# Patient Record
Sex: Female | Born: 1935
Health system: Southern US, Community
[De-identification: ages and names within clinical notes are randomized; demographics above are authoritative.]

## PROBLEM LIST (undated history)

## (undated) DIAGNOSIS — K219 Gastro-esophageal reflux disease without esophagitis: Secondary | ICD-10-CM

## (undated) DIAGNOSIS — I878 Other specified disorders of veins: Secondary | ICD-10-CM

## (undated) DIAGNOSIS — F419 Anxiety disorder, unspecified: Secondary | ICD-10-CM

## (undated) DIAGNOSIS — I10 Essential (primary) hypertension: Secondary | ICD-10-CM

## (undated) DIAGNOSIS — I82409 Acute embolism and thrombosis of unspecified deep veins of unspecified lower extremity: Secondary | ICD-10-CM

## (undated) DIAGNOSIS — K5790 Diverticulosis of intestine, part unspecified, without perforation or abscess without bleeding: Secondary | ICD-10-CM

## (undated) DIAGNOSIS — C541 Malignant neoplasm of endometrium: Secondary | ICD-10-CM

## (undated) DIAGNOSIS — R413 Other amnesia: Secondary | ICD-10-CM

## (undated) DIAGNOSIS — K5731 Diverticulosis of large intestine without perforation or abscess with bleeding: Principal | ICD-10-CM

## (undated) DIAGNOSIS — L97929 Non-pressure chronic ulcer of unspecified part of left lower leg with unspecified severity: Secondary | ICD-10-CM

## (undated) DIAGNOSIS — Z800 Family history of malignant neoplasm of digestive organs: Secondary | ICD-10-CM

## (undated) DIAGNOSIS — E611 Iron deficiency: Secondary | ICD-10-CM

## (undated) DIAGNOSIS — Z923 Personal history of irradiation: Secondary | ICD-10-CM

## (undated) DIAGNOSIS — Z9289 Personal history of other medical treatment: Secondary | ICD-10-CM

## (undated) DIAGNOSIS — L97919 Non-pressure chronic ulcer of unspecified part of right lower leg with unspecified severity: Secondary | ICD-10-CM

## (undated) DIAGNOSIS — C50919 Malignant neoplasm of unspecified site of unspecified female breast: Secondary | ICD-10-CM

## (undated) DIAGNOSIS — F0390 Unspecified dementia without behavioral disturbance: Secondary | ICD-10-CM

## (undated) DIAGNOSIS — I739 Peripheral vascular disease, unspecified: Secondary | ICD-10-CM

## (undated) HISTORY — DX: Family history of malignant neoplasm of digestive organs: Z80.0

## (undated) HISTORY — DX: Personal history of irradiation: Z92.3

## (undated) HISTORY — PX: BREAST LUMPECTOMY: SHX2

## (undated) HISTORY — DX: Non-pressure chronic ulcer of unspecified part of right lower leg with unspecified severity: L97.919

## (undated) HISTORY — DX: Malignant neoplasm of unspecified site of unspecified female breast: C50.919

## (undated) HISTORY — DX: Essential (primary) hypertension: I10

## (undated) HISTORY — DX: Acute embolism and thrombosis of unspecified deep veins of unspecified lower extremity: I82.409

## (undated) HISTORY — DX: Anxiety disorder, unspecified: F41.9

## (undated) HISTORY — DX: Diverticulosis of intestine, part unspecified, without perforation or abscess without bleeding: K57.90

## (undated) HISTORY — DX: Other specified disorders of veins: I87.8

## (undated) HISTORY — DX: Other amnesia: R41.3

## (undated) HISTORY — DX: Iron deficiency: E61.1

## (undated) HISTORY — DX: Unspecified dementia, unspecified severity, without behavioral disturbance, psychotic disturbance, mood disturbance, and anxiety: F03.90

## (undated) HISTORY — DX: Non-pressure chronic ulcer of unspecified part of left lower leg with unspecified severity: L97.929

---

## 1990-11-22 DIAGNOSIS — I82409 Acute embolism and thrombosis of unspecified deep veins of unspecified lower extremity: Secondary | ICD-10-CM

## 1990-11-22 HISTORY — DX: Acute embolism and thrombosis of unspecified deep veins of unspecified lower extremity: I82.409

## 1992-11-22 HISTORY — PX: MASTECTOMY: SHX3

## 1999-02-24 ENCOUNTER — Inpatient Hospital Stay (HOSPITAL_COMMUNITY): Admission: RE | Admit: 1999-02-24 | Discharge: 1999-03-04 | Payer: Self-pay | Admitting: Vascular Surgery

## 1999-02-24 ENCOUNTER — Encounter: Payer: Self-pay | Admitting: Vascular Surgery

## 1999-12-01 ENCOUNTER — Inpatient Hospital Stay (HOSPITAL_COMMUNITY): Admission: RE | Admit: 1999-12-01 | Discharge: 1999-12-12 | Payer: Self-pay | Admitting: Vascular Surgery

## 2000-01-20 ENCOUNTER — Encounter: Admission: RE | Admit: 2000-01-20 | Discharge: 2000-01-20 | Payer: Self-pay | Admitting: Internal Medicine

## 2000-01-20 ENCOUNTER — Encounter: Payer: Self-pay | Admitting: Internal Medicine

## 2001-01-23 ENCOUNTER — Encounter: Admission: RE | Admit: 2001-01-23 | Discharge: 2001-01-23 | Payer: Self-pay | Admitting: Internal Medicine

## 2001-01-23 ENCOUNTER — Encounter: Payer: Self-pay | Admitting: Internal Medicine

## 2002-06-08 ENCOUNTER — Encounter: Admission: RE | Admit: 2002-06-08 | Discharge: 2002-06-08 | Payer: Self-pay | Admitting: Internal Medicine

## 2002-06-08 ENCOUNTER — Encounter: Payer: Self-pay | Admitting: Internal Medicine

## 2003-01-24 ENCOUNTER — Ambulatory Visit (HOSPITAL_COMMUNITY): Admission: RE | Admit: 2003-01-24 | Discharge: 2003-01-24 | Payer: Self-pay | Admitting: Internal Medicine

## 2003-01-24 ENCOUNTER — Encounter: Payer: Self-pay | Admitting: Internal Medicine

## 2003-05-24 ENCOUNTER — Ambulatory Visit (HOSPITAL_COMMUNITY): Admission: RE | Admit: 2003-05-24 | Discharge: 2003-05-24 | Payer: Self-pay | Admitting: Pulmonary Disease

## 2003-05-24 ENCOUNTER — Encounter: Payer: Self-pay | Admitting: Internal Medicine

## 2003-07-03 ENCOUNTER — Encounter: Payer: Self-pay | Admitting: Internal Medicine

## 2003-07-03 ENCOUNTER — Encounter: Admission: RE | Admit: 2003-07-03 | Discharge: 2003-07-03 | Payer: Self-pay | Admitting: Internal Medicine

## 2003-12-21 ENCOUNTER — Emergency Department (HOSPITAL_COMMUNITY): Admission: EM | Admit: 2003-12-21 | Discharge: 2003-12-21 | Payer: Self-pay | Admitting: Emergency Medicine

## 2004-12-07 ENCOUNTER — Encounter: Admission: RE | Admit: 2004-12-07 | Discharge: 2004-12-07 | Payer: Self-pay | Admitting: Internal Medicine

## 2005-08-17 ENCOUNTER — Encounter (HOSPITAL_BASED_OUTPATIENT_CLINIC_OR_DEPARTMENT_OTHER): Admission: RE | Admit: 2005-08-17 | Discharge: 2005-11-15 | Payer: Self-pay | Admitting: Surgery

## 2005-11-22 HISTORY — PX: OTHER SURGICAL HISTORY: SHX169

## 2005-11-29 ENCOUNTER — Encounter (HOSPITAL_BASED_OUTPATIENT_CLINIC_OR_DEPARTMENT_OTHER): Admission: RE | Admit: 2005-11-29 | Discharge: 2005-12-23 | Payer: Self-pay | Admitting: Surgery

## 2005-12-15 ENCOUNTER — Encounter: Admission: RE | Admit: 2005-12-15 | Discharge: 2005-12-15 | Payer: Self-pay | Admitting: Internal Medicine

## 2006-01-04 ENCOUNTER — Encounter: Admission: RE | Admit: 2006-01-04 | Discharge: 2006-01-04 | Payer: Self-pay | Admitting: Internal Medicine

## 2006-01-13 ENCOUNTER — Encounter: Admission: RE | Admit: 2006-01-13 | Discharge: 2006-01-13 | Payer: Self-pay | Admitting: Internal Medicine

## 2006-01-13 ENCOUNTER — Encounter (INDEPENDENT_AMBULATORY_CARE_PROVIDER_SITE_OTHER): Payer: Self-pay | Admitting: Specialist

## 2006-01-13 ENCOUNTER — Encounter (INDEPENDENT_AMBULATORY_CARE_PROVIDER_SITE_OTHER): Payer: Self-pay | Admitting: Radiology

## 2006-01-24 ENCOUNTER — Encounter: Admission: RE | Admit: 2006-01-24 | Discharge: 2006-01-24 | Payer: Self-pay | Admitting: General Surgery

## 2006-01-26 ENCOUNTER — Encounter: Admission: RE | Admit: 2006-01-26 | Discharge: 2006-01-26 | Payer: Self-pay | Admitting: General Surgery

## 2006-01-26 ENCOUNTER — Encounter (INDEPENDENT_AMBULATORY_CARE_PROVIDER_SITE_OTHER): Payer: Self-pay | Admitting: Specialist

## 2006-01-26 ENCOUNTER — Ambulatory Visit (HOSPITAL_BASED_OUTPATIENT_CLINIC_OR_DEPARTMENT_OTHER): Admission: RE | Admit: 2006-01-26 | Discharge: 2006-01-26 | Payer: Self-pay | Admitting: General Surgery

## 2006-02-04 ENCOUNTER — Ambulatory Visit (HOSPITAL_BASED_OUTPATIENT_CLINIC_OR_DEPARTMENT_OTHER): Admission: RE | Admit: 2006-02-04 | Discharge: 2006-02-04 | Payer: Self-pay | Admitting: General Surgery

## 2006-02-04 ENCOUNTER — Encounter (INDEPENDENT_AMBULATORY_CARE_PROVIDER_SITE_OTHER): Payer: Self-pay | Admitting: Specialist

## 2006-02-10 ENCOUNTER — Ambulatory Visit: Admission: RE | Admit: 2006-02-10 | Discharge: 2006-04-28 | Payer: Self-pay | Admitting: Radiation Oncology

## 2006-04-19 ENCOUNTER — Ambulatory Visit (HOSPITAL_COMMUNITY): Admission: RE | Admit: 2006-04-19 | Discharge: 2006-04-19 | Payer: Self-pay | Admitting: Internal Medicine

## 2006-04-19 ENCOUNTER — Encounter (INDEPENDENT_AMBULATORY_CARE_PROVIDER_SITE_OTHER): Payer: Self-pay | Admitting: *Deleted

## 2006-05-02 ENCOUNTER — Encounter: Payer: Self-pay | Admitting: Vascular Surgery

## 2006-05-02 ENCOUNTER — Ambulatory Visit (HOSPITAL_COMMUNITY): Admission: RE | Admit: 2006-05-02 | Discharge: 2006-05-02 | Payer: Self-pay | Admitting: Internal Medicine

## 2006-08-05 ENCOUNTER — Encounter (HOSPITAL_BASED_OUTPATIENT_CLINIC_OR_DEPARTMENT_OTHER): Admission: RE | Admit: 2006-08-05 | Discharge: 2006-08-18 | Payer: Self-pay | Admitting: Surgery

## 2006-08-22 ENCOUNTER — Encounter (HOSPITAL_BASED_OUTPATIENT_CLINIC_OR_DEPARTMENT_OTHER): Admission: RE | Admit: 2006-08-22 | Discharge: 2006-10-05 | Payer: Self-pay | Admitting: Surgery

## 2006-10-11 ENCOUNTER — Ambulatory Visit: Payer: Self-pay | Admitting: Oncology

## 2006-12-14 ENCOUNTER — Ambulatory Visit: Payer: Self-pay | Admitting: Oncology

## 2006-12-19 LAB — COMPREHENSIVE METABOLIC PANEL
BUN: 24 mg/dL — ABNORMAL HIGH (ref 6–23)
CO2: 26 mEq/L (ref 19–32)
Creatinine, Ser: 0.96 mg/dL (ref 0.40–1.20)
Glucose, Bld: 104 mg/dL — ABNORMAL HIGH (ref 70–99)
Sodium: 141 mEq/L (ref 135–145)
Total Bilirubin: 0.4 mg/dL (ref 0.3–1.2)
Total Protein: 7 g/dL (ref 6.0–8.3)

## 2006-12-19 LAB — CBC WITH DIFFERENTIAL/PLATELET
Basophils Absolute: 0 10*3/uL (ref 0.0–0.1)
Eosinophils Absolute: 0.1 10*3/uL (ref 0.0–0.5)
HCT: 35.9 % (ref 34.8–46.6)
HGB: 12.2 g/dL (ref 11.6–15.9)
LYMPH%: 27.2 % (ref 14.0–48.0)
MONO#: 0.5 10*3/uL (ref 0.1–0.9)
NEUT#: 4 10*3/uL (ref 1.5–6.5)
NEUT%: 63.4 % (ref 39.6–76.8)
Platelets: 261 10*3/uL (ref 145–400)
WBC: 6.3 10*3/uL (ref 3.9–10.0)

## 2006-12-19 LAB — LACTATE DEHYDROGENASE: LDH: 135 U/L (ref 94–250)

## 2007-01-19 ENCOUNTER — Encounter: Admission: RE | Admit: 2007-01-19 | Discharge: 2007-01-19 | Payer: Self-pay | Admitting: Internal Medicine

## 2007-04-21 ENCOUNTER — Ambulatory Visit: Payer: Self-pay | Admitting: Oncology

## 2007-04-25 LAB — COMPREHENSIVE METABOLIC PANEL
ALT: 19 U/L (ref 0–35)
CO2: 23 mEq/L (ref 19–32)
Calcium: 9.4 mg/dL (ref 8.4–10.5)
Chloride: 106 mEq/L (ref 96–112)
Potassium: 4.2 mEq/L (ref 3.5–5.3)
Sodium: 143 mEq/L (ref 135–145)
Total Bilirubin: 0.5 mg/dL (ref 0.3–1.2)
Total Protein: 6.8 g/dL (ref 6.0–8.3)

## 2007-04-25 LAB — CBC WITH DIFFERENTIAL/PLATELET
BASO%: 0.3 % (ref 0.0–2.0)
MCHC: 34.3 g/dL (ref 32.0–36.0)
MONO#: 0.6 10*3/uL (ref 0.1–0.9)
RBC: 3.95 10*6/uL (ref 3.70–5.32)
RDW: 15.9 % — ABNORMAL HIGH (ref 11.3–14.5)
WBC: 5.5 10*3/uL (ref 3.9–10.0)
lymph#: 1.3 10*3/uL (ref 0.9–3.3)

## 2007-04-25 LAB — IRON AND TIBC: TIBC: 344 ug/dL (ref 250–470)

## 2007-04-25 LAB — LACTATE DEHYDROGENASE: LDH: 134 U/L (ref 94–250)

## 2007-06-29 ENCOUNTER — Encounter (HOSPITAL_BASED_OUTPATIENT_CLINIC_OR_DEPARTMENT_OTHER): Admission: RE | Admit: 2007-06-29 | Discharge: 2007-08-22 | Payer: Self-pay | Admitting: Internal Medicine

## 2007-08-17 ENCOUNTER — Ambulatory Visit: Payer: Self-pay | Admitting: Oncology

## 2007-08-21 ENCOUNTER — Encounter (HOSPITAL_BASED_OUTPATIENT_CLINIC_OR_DEPARTMENT_OTHER): Admission: RE | Admit: 2007-08-21 | Discharge: 2007-11-19 | Payer: Self-pay | Admitting: Surgery

## 2007-08-21 LAB — COMPREHENSIVE METABOLIC PANEL
ALT: 12 U/L (ref 0–35)
BUN: 26 mg/dL — ABNORMAL HIGH (ref 6–23)
CO2: 25 mEq/L (ref 19–32)
Calcium: 9.5 mg/dL (ref 8.4–10.5)
Chloride: 106 mEq/L (ref 96–112)
Creatinine, Ser: 1.05 mg/dL (ref 0.40–1.20)
Glucose, Bld: 90 mg/dL (ref 70–99)

## 2007-08-21 LAB — CBC WITH DIFFERENTIAL/PLATELET
Basophils Absolute: 0 10*3/uL (ref 0.0–0.1)
Eosinophils Absolute: 0.1 10*3/uL (ref 0.0–0.5)
HGB: 11.7 g/dL (ref 11.6–15.9)
MCV: 84.4 fL (ref 81.0–101.0)
MONO%: 9.5 % (ref 0.0–13.0)
NEUT#: 4.4 10*3/uL (ref 1.5–6.5)
RDW: 17.1 % — ABNORMAL HIGH (ref 11.3–14.5)

## 2007-08-21 LAB — LACTATE DEHYDROGENASE: LDH: 143 U/L (ref 94–250)

## 2007-12-19 ENCOUNTER — Ambulatory Visit: Payer: Self-pay | Admitting: Oncology

## 2007-12-21 LAB — COMPREHENSIVE METABOLIC PANEL
ALT: 13 U/L (ref 0–35)
AST: 14 U/L (ref 0–37)
Albumin: 4.3 g/dL (ref 3.5–5.2)
BUN: 25 mg/dL — ABNORMAL HIGH (ref 6–23)
Calcium: 9.5 mg/dL (ref 8.4–10.5)
Chloride: 105 mEq/L (ref 96–112)
Potassium: 4.5 mEq/L (ref 3.5–5.3)

## 2007-12-21 LAB — CBC WITH DIFFERENTIAL/PLATELET
BASO%: 0.2 % (ref 0.0–2.0)
Basophils Absolute: 0 10*3/uL (ref 0.0–0.1)
EOS%: 0.7 % (ref 0.0–7.0)
HGB: 12.4 g/dL (ref 11.6–15.9)
MCH: 28.3 pg (ref 26.0–34.0)
MONO#: 0.6 10*3/uL (ref 0.1–0.9)
NEUT#: 4.5 10*3/uL (ref 1.5–6.5)
RDW: 15.8 % — ABNORMAL HIGH (ref 11.3–14.5)
WBC: 6.8 10*3/uL (ref 3.9–10.0)
lymph#: 1.7 10*3/uL (ref 0.9–3.3)

## 2008-01-25 ENCOUNTER — Encounter: Admission: RE | Admit: 2008-01-25 | Discharge: 2008-01-25 | Payer: Self-pay | Admitting: Internal Medicine

## 2008-04-16 ENCOUNTER — Ambulatory Visit: Payer: Self-pay | Admitting: Oncology

## 2008-04-18 LAB — COMPREHENSIVE METABOLIC PANEL
AST: 11 U/L (ref 0–37)
Alkaline Phosphatase: 66 U/L (ref 39–117)
BUN: 20 mg/dL (ref 6–23)
Glucose, Bld: 89 mg/dL (ref 70–99)
Sodium: 140 mEq/L (ref 135–145)
Total Bilirubin: 0.5 mg/dL (ref 0.3–1.2)

## 2008-04-18 LAB — CBC WITH DIFFERENTIAL/PLATELET
Basophils Absolute: 0 10*3/uL (ref 0.0–0.1)
EOS%: 1.2 % (ref 0.0–7.0)
HCT: 34.9 % (ref 34.8–46.6)
HGB: 11.8 g/dL (ref 11.6–15.9)
MCH: 28.8 pg (ref 26.0–34.0)
MCV: 85.2 fL (ref 81.0–101.0)
MONO%: 8.6 % (ref 0.0–13.0)
NEUT%: 67.5 % (ref 39.6–76.8)
RDW: 16 % — ABNORMAL HIGH (ref 11.3–14.5)

## 2008-10-09 ENCOUNTER — Ambulatory Visit: Payer: Self-pay | Admitting: Oncology

## 2008-10-11 LAB — COMPREHENSIVE METABOLIC PANEL
ALT: 13 U/L (ref 0–35)
AST: 12 U/L (ref 0–37)
Calcium: 9.3 mg/dL (ref 8.4–10.5)
Chloride: 104 mEq/L (ref 96–112)
Creatinine, Ser: 1.02 mg/dL (ref 0.40–1.20)
Sodium: 140 mEq/L (ref 135–145)
Total Protein: 6.6 g/dL (ref 6.0–8.3)

## 2008-10-11 LAB — CBC WITH DIFFERENTIAL/PLATELET
BASO%: 0.1 % (ref 0.0–2.0)
EOS%: 1.1 % (ref 0.0–7.0)
HCT: 35.3 % (ref 34.8–46.6)
MCH: 29.6 pg (ref 26.0–34.0)
MCHC: 33.9 g/dL (ref 32.0–36.0)
NEUT%: 64.8 % (ref 39.6–76.8)
RBC: 4.05 10*6/uL (ref 3.70–5.32)
RDW: 15.9 % — ABNORMAL HIGH (ref 11.3–14.5)
WBC: 6 10*3/uL (ref 3.9–10.0)
lymph#: 1.5 10*3/uL (ref 0.9–3.3)

## 2008-10-14 ENCOUNTER — Encounter (HOSPITAL_BASED_OUTPATIENT_CLINIC_OR_DEPARTMENT_OTHER): Admission: RE | Admit: 2008-10-14 | Discharge: 2008-11-21 | Payer: Self-pay | Admitting: Internal Medicine

## 2008-11-26 ENCOUNTER — Encounter (HOSPITAL_BASED_OUTPATIENT_CLINIC_OR_DEPARTMENT_OTHER): Admission: RE | Admit: 2008-11-26 | Discharge: 2009-02-24 | Payer: Self-pay | Admitting: Internal Medicine

## 2009-01-27 ENCOUNTER — Encounter: Admission: RE | Admit: 2009-01-27 | Discharge: 2009-01-27 | Payer: Self-pay | Admitting: Internal Medicine

## 2009-02-25 ENCOUNTER — Encounter (HOSPITAL_BASED_OUTPATIENT_CLINIC_OR_DEPARTMENT_OTHER): Admission: RE | Admit: 2009-02-25 | Discharge: 2009-03-26 | Payer: Self-pay | Admitting: General Surgery

## 2009-04-08 ENCOUNTER — Ambulatory Visit: Payer: Self-pay | Admitting: Oncology

## 2009-04-10 LAB — COMPREHENSIVE METABOLIC PANEL
ALT: 10 U/L (ref 0–35)
Albumin: 4.1 g/dL (ref 3.5–5.2)
CO2: 26 mEq/L (ref 19–32)
Calcium: 9.5 mg/dL (ref 8.4–10.5)
Chloride: 105 mEq/L (ref 96–112)
Glucose, Bld: 91 mg/dL (ref 70–99)
Potassium: 4.3 mEq/L (ref 3.5–5.3)
Sodium: 141 mEq/L (ref 135–145)
Total Protein: 6.9 g/dL (ref 6.0–8.3)

## 2009-04-10 LAB — CBC WITH DIFFERENTIAL/PLATELET
BASO%: 0.4 % (ref 0.0–2.0)
Basophils Absolute: 0 10*3/uL (ref 0.0–0.1)
EOS%: 0.9 % (ref 0.0–7.0)
HCT: 35.8 % (ref 34.8–46.6)
HGB: 12.1 g/dL (ref 11.6–15.9)
LYMPH%: 25.7 % (ref 14.0–49.7)
MCH: 29.5 pg (ref 25.1–34.0)
MCHC: 33.7 g/dL (ref 31.5–36.0)
MCV: 87.7 fL (ref 79.5–101.0)
NEUT%: 65.3 % (ref 38.4–76.8)
Platelets: 239 10*3/uL (ref 145–400)

## 2009-04-10 LAB — LACTATE DEHYDROGENASE: LDH: 131 U/L (ref 94–250)

## 2009-05-12 ENCOUNTER — Encounter: Admission: RE | Admit: 2009-05-12 | Discharge: 2009-05-12 | Payer: Self-pay | Admitting: Internal Medicine

## 2009-06-02 ENCOUNTER — Encounter (HOSPITAL_BASED_OUTPATIENT_CLINIC_OR_DEPARTMENT_OTHER): Admission: RE | Admit: 2009-06-02 | Discharge: 2009-08-31 | Payer: Self-pay | Admitting: Internal Medicine

## 2009-06-10 ENCOUNTER — Ambulatory Visit: Payer: Self-pay | Admitting: Vascular Surgery

## 2009-09-03 ENCOUNTER — Encounter (HOSPITAL_BASED_OUTPATIENT_CLINIC_OR_DEPARTMENT_OTHER): Admission: RE | Admit: 2009-09-03 | Discharge: 2009-11-24 | Payer: Self-pay | Admitting: Internal Medicine

## 2009-10-07 ENCOUNTER — Ambulatory Visit: Payer: Self-pay | Admitting: Oncology

## 2009-10-09 LAB — CBC WITH DIFFERENTIAL/PLATELET
BASO%: 0.3 % (ref 0.0–2.0)
EOS%: 0.5 % (ref 0.0–7.0)
LYMPH%: 18.8 % (ref 14.0–49.7)
MCH: 29.8 pg (ref 25.1–34.0)
MCHC: 33.5 g/dL (ref 31.5–36.0)
MONO#: 0.4 10*3/uL (ref 0.1–0.9)
MONO%: 4.4 % (ref 0.0–14.0)
Platelets: 264 10*3/uL (ref 145–400)
RBC: 3.95 10*6/uL (ref 3.70–5.45)
WBC: 8.1 10*3/uL (ref 3.9–10.3)

## 2009-10-09 LAB — COMPREHENSIVE METABOLIC PANEL
ALT: 11 U/L (ref 0–35)
AST: 14 U/L (ref 0–37)
Alkaline Phosphatase: 56 U/L (ref 39–117)
CO2: 25 mEq/L (ref 19–32)
Creatinine, Ser: 0.98 mg/dL (ref 0.40–1.20)
Sodium: 141 mEq/L (ref 135–145)
Total Bilirubin: 0.4 mg/dL (ref 0.3–1.2)
Total Protein: 6.7 g/dL (ref 6.0–8.3)

## 2009-11-26 ENCOUNTER — Encounter (HOSPITAL_BASED_OUTPATIENT_CLINIC_OR_DEPARTMENT_OTHER): Admission: RE | Admit: 2009-11-26 | Discharge: 2010-02-24 | Payer: Self-pay | Admitting: Internal Medicine

## 2010-02-25 ENCOUNTER — Encounter (HOSPITAL_BASED_OUTPATIENT_CLINIC_OR_DEPARTMENT_OTHER): Admission: RE | Admit: 2010-02-25 | Discharge: 2010-05-26 | Payer: Self-pay | Admitting: Internal Medicine

## 2010-03-13 ENCOUNTER — Encounter: Admission: RE | Admit: 2010-03-13 | Discharge: 2010-03-13 | Payer: Self-pay | Admitting: Internal Medicine

## 2010-04-06 ENCOUNTER — Ambulatory Visit: Payer: Self-pay | Admitting: Oncology

## 2010-04-14 LAB — CBC WITH DIFFERENTIAL/PLATELET
EOS%: 0.8 % (ref 0.0–7.0)
Eosinophils Absolute: 0 10*3/uL (ref 0.0–0.5)
HCT: 35.4 % (ref 34.8–46.6)
MCH: 29.8 pg (ref 25.1–34.0)
MCHC: 33.6 g/dL (ref 31.5–36.0)
MONO#: 0.4 10*3/uL (ref 0.1–0.9)
NEUT#: 3.9 10*3/uL (ref 1.5–6.5)
RBC: 4 10*6/uL (ref 3.70–5.45)

## 2010-04-14 LAB — COMPREHENSIVE METABOLIC PANEL
AST: 15 U/L (ref 0–37)
Albumin: 4.2 g/dL (ref 3.5–5.2)
BUN: 21 mg/dL (ref 6–23)
CO2: 27 mEq/L (ref 19–32)
Creatinine, Ser: 1.09 mg/dL (ref 0.40–1.20)
Glucose, Bld: 90 mg/dL (ref 70–99)
Total Bilirubin: 0.5 mg/dL (ref 0.3–1.2)

## 2010-10-30 ENCOUNTER — Ambulatory Visit: Payer: Self-pay | Admitting: Genetic Counselor

## 2010-11-12 ENCOUNTER — Encounter (HOSPITAL_BASED_OUTPATIENT_CLINIC_OR_DEPARTMENT_OTHER)
Admission: RE | Admit: 2010-11-12 | Discharge: 2010-12-22 | Payer: Self-pay | Source: Home / Self Care | Attending: Internal Medicine | Admitting: Internal Medicine

## 2010-11-24 ENCOUNTER — Ambulatory Visit: Admit: 2010-11-24 | Payer: Self-pay | Admitting: Vascular Surgery

## 2010-11-24 ENCOUNTER — Ambulatory Visit
Admission: RE | Admit: 2010-11-24 | Discharge: 2010-11-24 | Payer: Self-pay | Source: Home / Self Care | Attending: Vascular Surgery | Admitting: Vascular Surgery

## 2010-12-13 ENCOUNTER — Encounter: Payer: Self-pay | Admitting: Internal Medicine

## 2010-12-23 ENCOUNTER — Encounter (HOSPITAL_BASED_OUTPATIENT_CLINIC_OR_DEPARTMENT_OTHER): Payer: Medicare Other | Attending: Internal Medicine

## 2010-12-23 DIAGNOSIS — Z79899 Other long term (current) drug therapy: Secondary | ICD-10-CM | POA: Insufficient documentation

## 2010-12-23 DIAGNOSIS — L97809 Non-pressure chronic ulcer of other part of unspecified lower leg with unspecified severity: Secondary | ICD-10-CM | POA: Insufficient documentation

## 2011-01-21 ENCOUNTER — Encounter (HOSPITAL_BASED_OUTPATIENT_CLINIC_OR_DEPARTMENT_OTHER): Payer: Medicare Other | Attending: Internal Medicine

## 2011-01-21 DIAGNOSIS — L97809 Non-pressure chronic ulcer of other part of unspecified lower leg with unspecified severity: Secondary | ICD-10-CM | POA: Insufficient documentation

## 2011-01-21 DIAGNOSIS — Z79899 Other long term (current) drug therapy: Secondary | ICD-10-CM | POA: Insufficient documentation

## 2011-02-11 ENCOUNTER — Other Ambulatory Visit: Payer: Self-pay | Admitting: Internal Medicine

## 2011-02-11 DIAGNOSIS — Z9889 Other specified postprocedural states: Secondary | ICD-10-CM

## 2011-02-11 DIAGNOSIS — Z9010 Acquired absence of unspecified breast and nipple: Secondary | ICD-10-CM

## 2011-02-25 ENCOUNTER — Encounter (HOSPITAL_BASED_OUTPATIENT_CLINIC_OR_DEPARTMENT_OTHER): Payer: Medicare Other | Attending: Internal Medicine

## 2011-02-25 DIAGNOSIS — Z79899 Other long term (current) drug therapy: Secondary | ICD-10-CM | POA: Insufficient documentation

## 2011-02-25 DIAGNOSIS — L97809 Non-pressure chronic ulcer of other part of unspecified lower leg with unspecified severity: Secondary | ICD-10-CM | POA: Insufficient documentation

## 2011-03-15 ENCOUNTER — Ambulatory Visit
Admission: RE | Admit: 2011-03-15 | Discharge: 2011-03-15 | Disposition: A | Discharge status: Home / Self Care | Payer: Medicare Other | Source: Ambulatory Visit | Attending: Internal Medicine | Admitting: Internal Medicine

## 2011-03-15 DIAGNOSIS — Z9010 Acquired absence of unspecified breast and nipple: Secondary | ICD-10-CM

## 2011-03-15 DIAGNOSIS — Z9889 Other specified postprocedural states: Secondary | ICD-10-CM

## 2011-04-06 NOTE — Assessment & Plan Note (Signed)
[facility]   NAME:  [name], [name]           ACCOUNT NO.:  [id number]   MEDICAL RECORD NO.:  [id number]      DATE OF BIRTH:  [birth year]   PHYSICIAN:  Leonie Man, M.D.    VISIT DATE:  01/01/2009                                   OFFICE VISIT    1. Venous stasis ulcer of the medial aspect of the left leg.  2. Ingrown nail with hypergranulation, the lateral aspect of the of      the hallux.   The patient has no particular complaints.  She is tolerating her  compressive wrap well.  She has no systemic symptoms.  Her vital signs  are stable.  This patient is not diabetic.  On examination of the left  medial ankle, the wound is healing well with good granulations  throughout.  There is no debridement necessary.  The edema is down well.  We will continue her on Hydrogel in a Sofsorb with a Profore wrap with  some barrier cream around the wound edge for additional protection.   The right ingrown nail was treated with some silver nitrate and a Band-  Aid.  She may need to have a partial unguinectomy if this persists.   We will follow up with her in 1 week.      Leonie Man, M.D.  Electronically Signed     PB/MEDQ  D:  01/01/2009  T:  01/02/2009  Job:  [id number]

## 2011-04-06 NOTE — Assessment & Plan Note (Signed)
[facility]   NAME:  [name], [name]           ACCOUNT NO.:  [id number]   MEDICAL RECORD NO.:  [id number]      DATE OF BIRTH:  1925/01/31   PHYSICIAN:  Theresia Majors. Tanda Rockers, M.D. VISIT DATE:  09/13/2007                                   OFFICE VISIT   SUBJECTIVE:  [name] is an 77 year old lady who we are following for  stasis ulceration involving the left lower extremity.  She continues to  be ambulatory.  She has no significant pain.  There is mild drainage of  no particular offensive odor.  She is accompanied by her daughter.   OBJECTIVE:  Blood pressure is 144/82, respirations 18, pulse rate 70,  temperature 97.5.  Inspection of the left lower extremity shows a  healthy 100 sent granulating bed with minimum drainage, no need for  debridement.  The capillary refill is brisk.  The dorsalis pedis pulse  is slightly palpable.  There is no evidence of an ascending infection.   ASSESSMENT:  Clinical response to compression therapy.   PLAN:  We will continue the Unna wrap and reevaluate her in 10 days.      Harold A. Tanda Rockers, M.D.  Electronically Signed     HAN/MEDQ  D:  09/13/2007  T:  09/13/2007  Job:  [id number]

## 2011-04-06 NOTE — Assessment & Plan Note (Signed)
[facility]   NAME:  [name], [name]           ACCOUNT NO.:  [id number]   MEDICAL RECORD NO.:  [id number]      DATE OF BIRTH:  [birth year]   PHYSICIAN:  Jonelle Sports. Sevier, M.D.  VISIT DATE:  01/08/2009                                   OFFICE VISIT   HISTORY:  This 77 year old black female has been followed for a stasis  ulceration on the medial aspect of her left ankle and also for a rather  medial paronychia of the right hallux.  The ankle ulcer most recently  has been treated with applications of Prisma and a compressive wrap  whereas the very hypertrophic paronychia on the right hallux has been  aggressively and sharply debrided each time and treated with  applications of silver nitrate.  Fortunately, she has had no deep  infection there that we can detect.   The patient reports both lesions seem to be doing well to her  satisfaction.  She has had no new symptoms and no fever or systemic  symptoms.   PHYSICAL EXAMINATION:  VITAL SIGNS:  Blood pressure 136/77, pulse 63,  respirations 18, and temperature 97.6.  EXTREMITIES:  The left medial ankle wound measures 2.0 x 0.9 x 0.2 with  a good clean granular base.  There is some accumulation of crusting  distal to the wound, but the wound margins and so forth look quite good.   The right great toe shows a dramatic reduction in the hypertrophic  tissue with no definite open wound at this point.   IMPRESSION:  Satisfactory course of both lesions.   DISPOSITION:  The wound on the left medial ankle is selectively debrided  to get rid of all the crust that has accumulated distal to the wound.  This is well tolerated.  The wound was then treated with an application  of hydrogel and Silverlon and that extremity is returned to a Profore  wrap.   The right halluceal paronychia requires some sharp limited debridement  again of the hypertrophic tissue and this is then in turn cauterized  with silver nitrate.  That  wound is treated with an application of  Neosporin and a Band-Aid and such dressing is recommended to be changed  on a daily basis with gentle cleansing of the wound.   The patient will be seen in 1 week for followup by the nurse and in 2  weeks by the physician.           ______________________________  Jonelle Sports. Cheryll Cockayne, M.D.     RES/MEDQ  D:  01/08/2009  T:  01/08/2009  Job:  [id number]

## 2011-04-06 NOTE — Assessment & Plan Note (Signed)
[facility]   NAME:  [name], [name] NO.:  [id number]   MEDICAL RECORD NO.:  [id number]      DATE OF BIRTH:  June 03, 1925   PHYSICIAN:  Maxwell Caul, M.D. VISIT DATE:  07/03/2007                                   OFFICE VISIT   PURPOSE OF TODAY'S VISIT:  This is a re-visitation for this woman who  was previously seen here and discharged in November 2007 for venous  stasis ulcers.  She had been using graded pressure stockings and was  doing well up until 2 weeks ago.  The the patient's daughter tells me  that 2 weeks ago she developed a spontaneous reopening of both  ulcerations on both sides of her legs on the lateral aspect of both her  left and right leg.  This happened without any obvious precipitating  event including no trauma.  The patient denies any temperature.  However, she is having significant pain bilaterally with these wounds.   EXAMINATION:  CARDIOVASCULAR:  There is no evidence of congestive heart  failure.  Heart sounds are normal.  Jugular venous pressure is not  elevated.  She has no sacral edema.  Circulation: Peripheral pulses are  easily palpable.  WOUND EXAM:  She has really 2 large areas of recurrent ulceration, one  on the left lateral lower leg measuring 6.5x5.2x0.1.  This had a large  area of fibrinous yellow eschar.  This underwent a full-thickness  debridement with a #15 blade.  We used EMLA for anesthesia and  hemostasis was with direct pressure.  Even with debridement of a large  amount of material, there is still a large amount of adherent eschar  here.  The wound on the right leg measured 5x4.5x0.2.  This also had  eschar on it.  However, this was fully debrided down to a good  granulating base.  Neither one of these wounds had any obvious evidence  of infection clinically.   IMPRESSION:  Recurrent venous stasis ulceration bilaterally.  Likely  this has more to do with inadequate edema control, as there  was a  moderate amount of edema present bilaterally.  There was no evidence of  cellulitis.  To the left leg, we applied an Accuzyme and an Unna wrap.  I put Silvercel and an Unna wrap on the right leg.  She is familiar with  the instructions about trying to keep her legs elevated and minimizing  dependency of her legs except when necessary.  I did prescribed Vicodin  that she could take at night for pain as the Darvocet she has is only  minimal effective.           ______________________________  Maxwell Caul, M.D.     MGR/MEDQ  D:  07/03/2007  T:  07/04/2007  Job:  [id number]   cc:   Lovenia Kim, D.O.

## 2011-04-06 NOTE — Consult Note (Signed)
NEW PATIENT CONSULTATION   [name], [name]  DOB:  [birth year]                                       06/10/2009  JWJXB#:[id number]   I saw the patient in the office today in consultation concerning her  venous stasis ulcers and to evaluate her for peripheral vascular  disease.  This is a pleasant 77 year old woman who has a long history of  recurrent venous stasis ulcers of both lower extremities, which she has  had for about 6 years.  She has seen Dr. Arbie Cookey in the past with this  problem several years ago.  She has been treated at the wound care  center here with good results and recently a venous stasis ulcer  adjacent to her medial malleolus had healed, and then 2 weeks ago,  opened up again.  For this reason, she was sent for vascular  consultation.   Of note, she does state that she has had a DVT in the past but cannot  remember the details.  She believes this was after some surgery on her  breast.  She was on Coumadin temporarily.  She is unaware of any history  of phlebitis.   She denies any history of claudication or rest pain.  The venous stasis  ulcers are currently being treated with a 2-layer PRAFO boot.   PAST MEDICAL HISTORY:  Significant for hypertension.  She denies any  history of diabetes, hypercholesterolemia, history of previous  myocardial infarction, history of congestive heart failure, or history  of COPD.   FAMILY HISTORY:  There is no history of premature cardiovascular  disease.   SOCIAL HISTORY:  She is widowed.  She has 6 children.  She does not use  tobacco.   REVIEW OF SYSTEMS AND MEDICATIONS:  Documented on the medical history  form in her chart.   PHYSICAL EXAMINATION:  This is a pleasant 77 year old woman who appears  her stated age.  Her blood pressure is 118/77, heart rate is 86.  I do  not detect any carotid bruits.  Lungs are clear bilaterally to  auscultation.  On cardiac exam, she has a regular rate and rhythm.   Her  abdomen is soft and nontender.  She has palpable femoral, popliteal, and  dorsalis pedis pulses bilaterally.  She has a venous stasis ulcer  adjacent to her right medial malleolus, which is fairly superficial.  She has an almost healed venous stasis ulcer adjacent to her left medial  malleolus.  She has moderate bilateral lower extremity swelling.   She did have a Doppler study done on June 21, which showed triphasic  Doppler signals in the right dorsalis pedis and a monophasic posterior  tibial signal on the left side.  She had biphasic signals in the  posterior tibial and dorsalis pedis positions.  Most importantly, took  digital pressure on the right was 100 mmHg, and on the left 118 mmHg.   Given that she has palpable dorsalis pedis pulses with excellent doe  pressures, I think that she has adequate circulation to heal these  wounds.  Typically, toe pressure greater than 40 mmHg suggests adequate  circulation for healing.  I think that she should continue to have her  wounds taken care of at the wound care center and continue with  intermittent elevation therapy until the wounds are healed.  Once the  wounds are healed, she knows the importance of continuing intermittent  elevation and wearing her compression stockings, in addition to keeping  her skin well lubricated.  If she would like to consider in the future,  once these wounds are healed, evaluation for possible ablation of the  saphenous vein, if this could potentially be contributing to her venous  disease, then I would recommend that she be seen by Dr. Arbie Cookey, who has  seen her before, and is involved in the [facility] here.  We will be  happy to see her at any time.  Currently, she stated that she was not  interested in any type of venous ablation surgery if she was a  candidate.   Di Kindle. Edilia Bo, M.D.  Electronically Signed   CSD/MEDQ  D:  06/10/2009  T:  06/10/2009  Job:  [id number]   cc:   Lovenia Kim,  D.O.

## 2011-04-06 NOTE — Assessment & Plan Note (Signed)
[facility]   NAME:  [name], [name]           ACCOUNT NO.:  [id number]   MEDICAL RECORD NO.:  [id number]      DATE OF BIRTH:  04/07/1925   PHYSICIAN:  Theresia Majors. Tanda Rockers, M.D. VISIT DATE:  11/02/2007                                   OFFICE VISIT   SUBJECTIVE:  [name] is an 77 year old lady who we are following for  stasis ulcer involving the lateral aspect of the left lower extremity.  In the interim she has worn an Radio broadcast assistant.  There has been no excessive  drainage, malodor, pain or fever.   OBJECTIVE:  Blood pressure is 138/65, respirations 18, pulse rate 75,  temperature 97.9.  Inspection of the left lower extremity shows that the  wound is significantly decreased.  There is no evidence of infection or  lymphangitis.  The dorsalis pedis pulse remains readily palpable.   ASSESSMENT:  Clinical improvement with control of edema.   PLAN:  We will return the patient to an Unna boot and reevaluate her in  7-10 days.      Harold A. Tanda Rockers, M.D.  Electronically Signed     HAN/MEDQ  D:  11/02/2007  T:  11/02/2007  Job:  [id number]

## 2011-04-06 NOTE — Group Therapy Note (Signed)
NAMEJACALYN, [name]           ACCOUNT NO.:  [id number]   MEDICAL RECORD NO.:  [id number]          PATIENT TYPE:  OUT   LOCATION:  FOOT                         FACILITY:  [facility]   PHYSICIAN:  Jonelle Sports. Sevier, M.D. DATE OF BIRTH:  1925-05-20                                 PROGRESS NOTE   HISTORY:  This 77 year old black female who has been followed for bilateral stasis  ulcerations, one in the inframalleolar of the left medial heel and 2  others on the distal right lower extremity and in the inframalleolar  area on the right heel.  Those on the right foot are more crusty lesions  whereas it is clearly an open ulcer on the left.   She reports perhaps some slight increase in pain in these areas, said  she was last seen, but she is very fearful and it is very difficult for  me to assess that report.   PHYSICAL EXAMINATION:  VITAL SIGNS:  Blood pressure is 139/76, pulse 71, respirations 18, and  temperature 97.9.   The wound on the left is quite clean, measures 1.1 x 1.1 x 0.1 cm with  nice stuck down margins and maybe some beginning evidence of epithelial  advancement there.  The areas on the right ankle as described are both  rather crusty.   The patient previously also had a hypertrophic chronic paronychia of the  right hallux and we have dealt with that if this has resolved.   IMPRESSION:  Satisfactory course, albeit slow of stasis wounds of bilateral lower  extremities.   DISPOSITION:  The wound on the left will be dressed today with Prisma covered by an  absorptive pad and that extremity placed in the Profore dressing.  The  wounds on the right lower extremity are debrided of their crusts and  will be treated with applications of Neosporin.  Silverlon and then a  Profore wrap on that extremity.   FOLLOWUP:  Follow up visit will be here in 12-14 days.           ______________________________  Jonelle Sports Cheryll Cockayne, M.D.     RES/MEDQ  D:  02/12/2009  T:  02/12/2009   Job:  [id number]

## 2011-04-06 NOTE — Assessment & Plan Note (Signed)
[facility]   NAME:  [name], [name] NO.:  [id number]   MEDICAL RECORD NO.:  [id number]      DATE OF BIRTH:  [birth year]   PHYSICIAN:  Ardath Sax, M.D.           VISIT DATE:                                   OFFICE VISIT   I saw her in the [facility].  She is complaining of venous stasis  ulcers on the medial right ankle.  On the left and right ankle, they are  about the same as they were on her last visit.  I tried to debride the  one on the right, which is a larger of the two and she would not allow  me to do this even with local anesthesia.  So, I debrided the best I  could with the curette and got some of the necrotic tissue off and we  washed them with soap and water and then applied bilateral Unna boots,  and she will come back in a week for [employer] change.      Ardath Sax, M.D.     PP/MEDQ  D:  07/02/2009  T:  07/03/2009  Job:  [id number]

## 2011-04-06 NOTE — Consult Note (Signed)
[name], [name]           ACCOUNT NO.:  [id number]   MEDICAL RECORD NO.:  [id number]          PATIENT TYPE:  REC   LOCATION:  FOOT                         FACILITY:  [facility]   PHYSICIAN:  Jonelle Sports. Sevier, M.D. DATE OF BIRTH:  02/02/1925   DATE OF CONSULTATION:  DATE OF DISCHARGE:                                 CONSULTATION   This 77 year old black female was seen at the courtesy of Dr. Andria Meuse  for management of weeping ulcerations of both lower extremities.   The patient is previously known to this clinic having been seen here in  November/December a year ago with stasis ulcerations on her  supramalleolar areas medially (gaiter area) and was healed with a  series of compressive wraps.  She also had had some trouble with  ulcerations about 5 years ago and had been seen and treated in the [facility] at that time again successfully.   The patient says that since her troubles here almost a year ago that she  has worn compression stockings faithfully.  She noted however  approximately a month ago a breaking down with weeping from both medial  inframalleolar areas.  She saw Dr. Elisabeth Most who has scheduled this  appointment for her further followup and treatment.   PAST MEDICAL HISTORY:  Operations include partial hysterectomy years  ago, breast surgery for cancer in 2007.  She has had no other operations  that we know off.  She has had no hospitalizations through the years.  She does have history of radiation therapy following portion of the  management of her breast cancer.   ALLERGIES:  None known.   REGULAR MEDICATIONS:  1. Omega-3 fish oil 1000 mg daily.  2. Advil 200 mg q.i.d. p.r.n.  3. Acetaminophen 650 mg q.i.d. p.r.n.  4. Alprazolam 0.5 mg one daily.  5. Amiloride 5/50 one tablet in the morning.  6. K-Lor concentrate 10 one and half tablets daily.  7. Chewable aspirin 81 mg daily.  8. Cephalexin 250 mg one four times daily finished the day  prior to      this visit after a 10-day course.   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  She does  not smoke, use alcohol or recreational drugs.  She is retired from a  domestic type job.  She is unsure about her last tetanus shot and has  never received flu or pneumonia vaccines of which she is aware.   Family history is not available in detail but apparently was positive  for hypertension.   REVIEW OF SYSTEMS:  The patient has never had known heart disease.  She  does have high blood pressure which has been adequately treated.  She  has recently been evaluated for some numbness in her hands at the bases  for which she is not entirely certain.  She has had the history of blood  clots in the lower extremities but is vague on those details and then  otherwise has the lower extremity history as  indicated in the present  illness.   PHYSICAL EXAMINATION:  VITAL SIGNS:  Blood pressure is 136/76, pulse 73  regular, respirations 20, temperature 97.4.  General:  She is alert, oriented and no immediate distress.  HEART:  Regular in rhythm.  NECK:  Neck veins are flat with the patient sitting at about 75 degrees.  EXTREMITIES:  Trace edema bilaterally.  There is chronic scarring of old  ulcers in the gaiter areas bilaterally.  There is encrustation of the  skin and weeping in the inframalleolar areas bilaterally.  Foot pulses  are bilaterally palpable and quite strong.   IMPRESSION:  Venous stasis ulcerations recurrent bilateral lower  extremities.   DISPOSITION:  The areas of encrusted skin and so forth are debrided to  look for open ulcerations that would account for the active weeping and  indeed two such ulcers are found on the left foot.  No such lesions  found on the right but the skin is still found to be quite vulnerable.   These lesions on the left inframalleolar area measure 0.9 x 0.8 x  0.1 cm  on the one hand and 0.4 x 0.3 x 0.1 cm on the other.   On the right lower extremity, there can be detected no definite open  areas although the evidence of weeping in this area of hypertrophic skin  is apparent.   IMPRESSION:  Chronic venous stasis ulcers bilateral lower extremities.   DISPOSITION:  The areas are sharply debrided of the loosened and crusty  skin with the findings and measurements as indicated above to such  debridement.  This is selective debridement and no point is anything  constituting excisional debridement carried out.   Following this the patient's wounds will be treated with an application  of Silverlon covered with hydrogel and a SofSorb pad, both extremities  then be placed in Profore wraps to the knees.   Followup visit will be here in 1 week to the nurse for dressing change  and in 2 weeks to the physician.           ______________________________  Jonelle Sports. Cheryll Cockayne, M.D.     RES/MEDQ  D:  10/16/2008  T:  10/17/2008  Job:  [id number]   cc:   Lovenia Kim, D.O.

## 2011-04-06 NOTE — Assessment & Plan Note (Signed)
[facility]   NAME:  [name], [name]           ACCOUNT NO.:  [id number]   MEDICAL RECORD NO.:  [id number]      DATE OF BIRTH:  [birth year]   PHYSICIAN:  Theresia Majors. Tanda Rockers, M.D. VISIT DATE:  08/03/2007                                   OFFICE VISIT   SUBJECTIVE:  [name] is a 77 year old lady who we have followed for  bilateral stasis ulcers.  We have treated her with Unna wraps and  doxycycline and Septra for her positive MRSA cultures.  She reports  decrease in drainage, no malodor.  She continues to be ambulatory.   OBJECTIVE:  Blood pressure is 135/75, respirations 18, pulse rate 80,  temperature is 97.8.  She is accompanied by her daughter.  Inspection of  the lower extremity shows that there has been adequate control of her  edema.  The wounds are 100% granulated with evidence of advancing  epithelium.  There is no evidence of ascending infection.  The pedal  pulse remains palpable.   ASSESSMENT:  Clinical improvement.   PLAN:  We will resume use of the bilateral Unna boots.  We will continue  her on her doxycycline and Septra.  We will reevaluate the patient in 1  week.      Harold A. Tanda Rockers, M.D.  Electronically Signed     HAN/MEDQ  D:  08/03/2007  T:  08/03/2007  Job:  [id number]

## 2011-04-06 NOTE — Assessment & Plan Note (Signed)
[facility]   NAME:  [name], [name]           ACCOUNT NO.:  [id number]   MEDICAL RECORD NO.:  [id number]      DATE OF BIRTH:  Mar 25, 1925   PHYSICIAN:  Jonelle Sports. Sevier, M.D.  VISIT DATE:  10/11/2007                                   OFFICE VISIT   HISTORY:  This 78 year old black female is followed for a lateral  supramalleolar venous stasis ulceration of the left lower extremity.  She has shown progressive improvement over the last few weeks and when  last seen here on November 12 it had been my intent that the nurse would  rewrap her today and that I would see her again the following week.  However, because she had some incrustation at the wound margins the  nurse requested that I look at the patient.  The patient has had no  complaints of increased pain, swelling, odor, drainage, fever or other  systemic symptoms.   PHYSICAL EXAMINATION:  VITAL SIGNS:  Blood pressure 147/78, pulse 77,  respirations 18, temperature 97.5.  The wound on the lateral aspect of  the left ankle measures 2.5 x 0.5 x 0.1 cm and indeed does have rather  crusty margins particularly at the proximal and distal extremes of the  wound.   IMPRESSION:  Satisfactory progress venous stasis ulcer left lower  extremity.   DISPOSITION:  The encrustations surrounding the wound and particularly  at the proximal and distal margins are sharply pared away, this being  selective debridement with satisfaction.  This leaves good wound margins  and a wound that appears to be progressively epithelializing.   In hopes to prevent reencrustation and so forth she will have a small  Telfa pad placed over the wound and then the extremity will be redressed  in Unna wrap.   Next visit will be here in 12 days because of the Thanksgiving holiday.  The patient is instructed to bring her compression stockings at that  time in expectation that the wound will be adequately healed and she can  move into the  stockings.  This concludes wound center progress note.           ______________________________  Jonelle Sports. Cheryll Cockayne, M.D.     RES/MEDQ  D:  10/11/2007  T:  10/11/2007  Job:  [id number]

## 2011-04-06 NOTE — Assessment & Plan Note (Signed)
[facility]   NAME:  [name], [name]           ACCOUNT NO.:  [id number]   MEDICAL RECORD NO.:  [id number]      DATE OF BIRTH:  Apr 18, 1925   PHYSICIAN:  Theresia Majors. Tanda Rockers, M.D. VISIT DATE:  11/09/2007                                   OFFICE VISIT   SUBJECTIVE:  [name] is an 77 year old lady whom we have followed for  a stasis ulcer involving the left lateral lower extremity.  In the  interim she continues to be ambulatory and has worn a compressive wrap.  There has been no excessive drainage malodor pain or fever.   OBJECTIVE:  Blood pressure is 129/73, respirations 18, pulse rate 80,  temperature 97.8.  Inspection of the lateral left leg shows that there is a minuscule  opening which appears to be healthy with advancing epithelium.  This  wound was measured and photographed.  Please refer the data entry.  There is no evidence of local of infection or ischemia.   ASSESSMENT:  Essentially resolved stasis ulcer.   PLAN:  We will we are discharging the patient from active care in the  wound center.  We have instructed her in the use of compression  garments.  We have given the patient and her daughter an opportunity to  ask questions.  They seem to understand and express gratitude for having  been seen in the clinic.  [name] is discharge.      Harold A. Tanda Rockers, M.D.  Electronically Signed     HAN/MEDQ  D:  11/09/2007  T:  11/09/2007  Job:  [id number]   cc:   Lovenia Kim, D.O.

## 2011-04-06 NOTE — Procedures (Signed)
DUPLEX DEEP VENOUS EXAM - LOWER EXTREMITY   INDICATION:  Left lower extremity ulcer.   HISTORY:  Edema:  No.  Trauma/Surgery:  No.  Pain:  No.  PE:  No.  Previous DVT:  Yes.  Anticoagulants:  No.  Other:   DUPLEX EXAM:                CFV   SFV   PopV  PTV    GSV                R  L  R  L  R  L  R   L  R  L  Thrombosis    o  o  p  p  p  p  o   o  o  o  Spontaneous   +  +  +  +  +  +  +   +  +  +  Phasic        +  +  +  +  +  +  +   +  +  +  Augmentation  +  +  +  +  +  +  +   +  +  +  Compressible  +  +  p  p  p  p  +   +  +  +  Competent     o  o  o  o  o  o  +   +  o  +   Legend:  + - yes  o - no  p - partial  D - decreased   IMPRESSION:  Bilateral lower extremity deep veins show evidence of  chronic thrombus and clinically significant insufficiency.    _____________________________  Janetta Hora Fields, MD   EM/MEDQ  D:  11/24/2010  T:  11/24/2010  Job:  [id number]

## 2011-04-06 NOTE — Assessment & Plan Note (Signed)
[facility]   NAME:  [name], [name]           ACCOUNT NO.:  [id number]   MEDICAL RECORD NO.:  [id number]      DATE OF BIRTH:  1925-09-24   PHYSICIAN:  Theresia Majors. Tanda Rockers, M.D. VISIT DATE:  07/10/2007                                   OFFICE VISIT   SUBJECTIVE:  [name] returns for follow-up of bilateral stasis  ulcers.  In the interim she has been treated with Accuzyme and Unna  wraps.  She reports she has excessive drainage as well as extreme  malodor.  There has been no fever.   OBJECTIVE:  Blood pressure 155/72, respirations 18, pulse rate 90,  temperature 97.6.  Inspection of the lower extremity shows that the  ulcers themselves have granulation, which appears to be healthy; with  areas of necrosis at the periphery.  These have been agitated with the  irrigation and have appeared to be cleaned.  Photographs were taken,  measurements were taken, and the data was cataloged.  Please refer to  the entries.  The pedal pulses are 3+.  There remains 2+ edema.  There  is no evidence of cellulitis or ascending lymphangitis.  The wounds are  angry; however, with the pungent exudate.  Neurologically she remains  intact.   ASSESSMENT:  Recurrent stasis ulcers with likely secondary colonization.   PLAN:  We will empirically start the patient on Septra and doxycycline.  Cultures have been ordered.  We will re-evaluate her in 4 days      Harold A. Tanda Rockers, M.D.  Electronically Signed     HAN/MEDQ  D:  07/10/2007  T:  07/11/2007  Job:  [id number]

## 2011-04-06 NOTE — Assessment & Plan Note (Signed)
[facility]   NAME:  [name], [name]           ACCOUNT NO.:  [id number]   MEDICAL RECORD NO.:  [id number]      DATE OF BIRTH:  [birth year]   PHYSICIAN:  Theresia Majors. Tanda Rockers, M.D. VISIT DATE:  07/27/2007                                   OFFICE VISIT   SUBJECTIVE:  [name] is a 77 year old lady who we follow for  bilateral stasis ulcers.  We have treated her with bilateral compression  wraps.  She reports that there has been moderate drainage but no  malodor, no pain and no fever.  She is accompanied by her daughter.   OBJECTIVE:  Blood pressure is 129/68, respirations 16, pulse rate 76,  temperature 98.  Inspection of the wound shows that there has been some  contraction of the wound with some decrease in volume.  These wounds  were measured, photographed and cataloged.  Please refer to the data  entry.  The pedal pulse remains readily palpable.  The edema is  moderately controlled as manifested by the presence of lineal wrinkles.   ASSESSMENT:  Clinical improvement with compression wraps.   PLAN:  We have recommended that we proceed with bilateral Apligrafs for  this patient.  We have explained the indications, the expected results,  possible complications to the patient and her daughter in terms that  they seem to understand.  They indicate their willingness to proceed as  outlined.  We will proceed with a precertification for the Apligraf and  will apply it on the next visit.  In the interim we will continue the  [employer] boot protocol.      Harold A. Tanda Rockers, M.D.  Electronically Signed     HAN/MEDQ  D:  07/27/2007  T:  07/27/2007  Job:  [id number]

## 2011-04-06 NOTE — Assessment & Plan Note (Signed)
[facility]   NAME:  [name], [name]           ACCOUNT NO.:  [id number]   MEDICAL RECORD NO.:  [id number]      DATE OF BIRTH:  [birth year]   PHYSICIAN:  Jonelle Sports. Sevier, M.D.  VISIT DATE:  02/26/2009                                   OFFICE VISIT   HISTORY:  This 77 year old black female has been followed for bilateral  stasis ulcerations on the medial right supramalleolar area and on the  left medial malleolar area.   She arrives today with those on the right lower extremity having healed  and with no specific complaints referable to her left lower extremity or  to her treatments.   PHYSICAL EXAMINATION:  VITAL SIGNS:  Blood pressure 143/76, pulse 79,  respirations 18, and temperature 97.7.  The right ankle wounds are  healed.  There is some small crust there, which I have cautiously  removed with no difficulty.  The left ankle shows an open ulceration  measuring 1.0 x 0.9 x 0.1 cm with some dressing remnant (prisma) and  loose skin at the wound margins.   IMPRESSION:  Resolution wounds, right lower extremity and continued  improvement of wound, left lower extremity.   DISPOSITION:  The crust on the right lower extremity are cautiously  removed without difficulty and revealing healed wounds.   The wound on the left lower extremity is selectively debrided of some of  the dressing remnants and loose skin, and looks quite good showing  evidence of advancing epithelialization of these margins.   The right wound will be replaced in a Profore wrap for her protection,  so she can bring with her compression stockings next visit.   The left lower extremity is dressed with application of the Silverlon  pad, a soft sore, a protective pad, and left extremity placed in a  Profore wrap also.   Return visit will be in 1 week.           ______________________________  Jonelle Sports. Cheryll Cockayne, M.D.     RES/MEDQ  D:  02/26/2009  T:  02/26/2009  Job:  [id number]

## 2011-04-06 NOTE — Assessment & Plan Note (Signed)
[facility]   NAME:  [name], POSTER NO.:  [id number]   MEDICAL RECORD NO.:  [id number]      DATE OF BIRTH:  11/24/1924   PHYSICIAN:  Maxwell Caul, M.D. VISIT DATE:  07/05/2007                                   OFFICE VISIT   LOCATION:  [facility]   [name] had returned to clinic on 08/11, having rather severe  bilateral venous stasis wounds.  We had dressed them topically and  applied bilateral Unna boots.  She returned today early complaining of  pain in the left greater than right wound area.  The wounds were  undressed and there was some concern about infection.  Therefore, I was  asked to look at them.   WOUND EXAM:  Temperature is 98.3, pulse 87, blood pressure 149/76.  Both  of the wounds were reasonably unchanged from my view.  The left, which  had an Accuzyme applied, had a better looking granulating base.  There  was some odor to this when the nurse removed the wrap.  However, I did  not see any evidence of cellulitis.  Similarly, on the right side we  removed the wrap.  However, there was no malodor, no evidence of  infection.  There is a new adherent eschar on the right side however.   I have put [name] back in with Accuzyme, TCA, and bilateral Unna  wraps.  Both of the wounds will be covered with SofSorb this time.  I  did not see any evidence of infection.   We will follow up with her early next week.  I did prescribed Percocet  5/325- 40 tablets with one repeat.  She is to take these every 4-6 hours  p.r.n..           ______________________________  Maxwell Caul, M.D.     MGR/MEDQ  D:  07/05/2007  T:  07/06/2007  Job:  [id number]

## 2011-04-06 NOTE — Assessment & Plan Note (Signed)
[facility]   NAME:  [name], [name]           ACCOUNT NO.:  [id number]   MEDICAL RECORD NO.:  [id number]      DATE OF BIRTH:  [birth year]   PHYSICIAN:  Jonelle Sports. Sevier, M.D.  VISIT DATE:  11/06/2008                                   OFFICE VISIT   This 77 year old black female has been followed for bilateral stasis  ulcers and everything is now healed except for one on the left medial  malleolar area.  She has been treated with Silverlon and a Profore wrap  and has made satisfactory progress along the way.   Since seen a week ago, she has had no fever, systemic symptoms, odor,  increased drainage, increased pain, or other symptoms to suggest  deterioration.   PHYSICAL EXAMINATION:  VITAL SIGNS:  Blood pressure 156/81, pulse 79,  respirations 20, and temperature 97.4.  The wound now measures 1.3 x 1.4 x 0.1 cm, which is just very slightly  smaller than before but notably it has advancing epithelial margins from  approximately 5 o'clock to 10 o'clock and another small area from  approximately 12 o'clock to 1 o'clock.  At the base of the wound, there  is some dark discoloration which does not look in anyway ominous and I  suspect represents small venulae that are entrapped in the healing wound  base which would not be unexpected in this particular area given her  known venous disease and the appearance of the adjacent intact areas.   IMPRESSION:  Satisfactory progress, stasis ulcer, left medial malleolar  area.   DISPOSITION:  The wound does not require debridement today.  I did pick  at it enough to see that these do indeed appear to be veins and decided  not to proceed any further.  It was then treated with an application of  Prisma covered by an Allevyn pad and then a Profore wrap.   She will return in 1 and 2 weeks for dressing changes by the nurse and  in 3 weeks to be seen by the physician.           ______________________________  Jonelle Sports.  Cheryll Cockayne, M.D.     RES/MEDQ  D:  11/06/2008  T:  11/06/2008  Job:  [id number]

## 2011-04-06 NOTE — Consult Note (Signed)
[name], [name]           ACCOUNT NO.:  [id number]   MEDICAL RECORD NO.:  [id number]          PATIENT TYPE:  REC   LOCATION:  FOOT                         FACILITY:  [facility]   PHYSICIAN:  Jonelle Sports. Sevier, M.D. DATE OF BIRTH:  02/19/1925   DATE OF CONSULTATION:  09/27/2007  DATE OF DISCHARGE:                                 CONSULTATION   WOUND CENTER PROGRESS NOTE   HISTORY:  This is an 77 year old white female who has been under  treatment with Unna wrap protocol for a lateral supramalleolar venous  stasis ulcer on the left lower extremity.   Since she was last seen here a week ago, the patient reports perhaps a  little more awareness of an odor, but no pain, no increased drainage, no  swelling, and no systemic symptoms.   Examination reveals blood pressure 120/60, pulse 69, respirations 16,  temperature 97.6.   The ulcer on the left lateral supramalleolar area is 3.0 x 1.0 x 0.05 cm  in dimension.  It is clean and granular.  There is some heaped up paste  from the [employer] at the wound's margins; this along with some  skin slough at the margins also.   IMPRESSION:  Satisfactory progress, chronic venous stasis ulceration,  left lower extremity.   DISPOSITION:  The wound is selectively debrided of the desquamated skin  and also the accumulated crust of dome paste, and the size of the ulcer  itself is found not to have changed.   She is treated with a reapplication of an Unna wrap and will be followed  up again in 1 week.   It is anticipated that this wound will progress to healing within the  next week or two.           ______________________________  Jonelle Sports. Cheryll Cockayne, M.D.     RES/MEDQ  D:  09/27/2007  T:  09/28/2007  Job:  [id number]

## 2011-04-06 NOTE — Assessment & Plan Note (Signed)
[facility]   NAME:  [name], [name] NO.:  [id number]   MEDICAL RECORD NO.:  [id number]      DATE OF BIRTH:  11/02/1925   PHYSICIAN:  Ardath Sax, M.D.           VISIT DATE:                                   OFFICE VISIT   She is an 77 year old lady who has venous stasis ulcers in the medial  right ankle and the medial left ankle.  The right ankle is much larger.  It is about 5 cm x 3 cm and the left medial ankle is only a couple of  centimeters.  I washed these with soap and water and I tried to debride  the one on the medial right ankle, but the lady said it hurt too much  and she did not want me to do any anesthesia.  I did attempt to put the  local lidocaine ointment for this, but it did not seem to help her.  So,  I debrided it as best as I could until I got so wary, it was very  uncomfortable and then I had the nurse put her on the Unna boot  dressings and she will come back in a week.      Ardath Sax, M.D.     PP/MEDQ  D:  07/02/2009  T:  07/02/2009  Job:  [id number]

## 2011-04-06 NOTE — Assessment & Plan Note (Signed)
[facility]   NAME:  [name], [name]           ACCOUNT NO.:  [id number]   MEDICAL RECORD NO.:  [id number]      DATE OF BIRTH:  May 23, 1925   PHYSICIAN:  Theresia Majors. Tanda Rockers, M.D. VISIT DATE:  10/23/2007                                   OFFICE VISIT   SUBJECTIVE:  [name] is an 77 year old lady whom we are following for  stasis ulceration involving the left lateral lower extremity.  The  patient has been treated with combined modalities emphasizing external  compression, with the addition of advanced therapies including Apligraf.  She continues to be ambulatory.  There has been no pain.  There is no  excessive drainage, malodor, or fever.  She is accompanied by her  daughter.   OBJECTIVE:  Blood pressure is 135/70, respirations 16, pulse rate 70,  temperature is 97.7.  Inspection of the left lateral lower extremity  shows that there is a 100% granulating wound which is decreased in area.  There is advancing epithelium from the periphery.  There is no evidence  of ischemia or active infection.   ASSESSMENT:  Clinical improvement.  To continue compression.   PLAN:  We are returning the patient to an Radio broadcast assistant.  We will reevaluate  her in 10-14 days.      Harold A. Tanda Rockers, M.D.  Electronically Signed     HAN/MEDQ  D:  10/23/2007  T:  10/23/2007  Job:  [id number]

## 2011-04-06 NOTE — Assessment & Plan Note (Signed)
[facility]   NAME:  [name], [name]           ACCOUNT NO.:  [id number]   MEDICAL RECORD NO.:  [id number]      DATE OF BIRTH:  1925/01/16   PHYSICIAN:  Jonelle Sports. Sevier, M.D.  VISIT DATE:  06/25/2009                                   OFFICE VISIT   HISTORY:  This 77 year old black female is being followed for bilateral  medial malleolar area stasis ulcerations.  There has been concern that  arterial vasculature was so compromised that healing these wounds might  be difficult, but she has been seen by the vascular surgeons who feel  that she does have adequate circulation to heal the wounds.  They have  tended to develop lots of slough and have considerable odor and we have  empirically used some gentamicin ointment on them from time to time.  The recent cultures have shown multiple organisms, none predominant and  none of an alarming nature.   Most recently, the patient has been treated with the gentamicin  ointment, calcium alginate, Kerlix, and Coban wraps bilaterally.   She was today saying that she is having more discomfort in these wounds  and that they to her way of thinking seem worse.  She has had no fever  or systemic symptoms, however.   On examination, blood pressure 149/83, pulse 95, respirations 16,  temperature 98.1.  The left medial ankle wound is 2.1 x 1.8 x 0.1 cm and  the wound on the right medial ankle is bipartite with the more proximal  segment measuring 3.6 x 2.5 x 0.4 cm and the more distal 3.4 x 2.4 x 0.3  cm.  Both of these wounds have a lot of macerated adjacent skin, have  considerable slough in their bases, and the proximal aspect of the right  wound is very dark and discolored, but this appears to be simply  secondary to underlying veins at that site.   IMPRESSION:  Failure to improve, chronic stasis ulcerations, both lower  extremities.   DISPOSITION:  Both ankles are sharply debrided removing a lot of  macerated skin from  the wound margins and slough from the wound bases.  Once this is done, they do look somewhat better, but they remain  somewhat malodorous.  She does not tolerate this well and I could not  get them completely clean.   My plan today is to change to [facility] with wet-to-dry dressings to be  changed three times weekly by the home health nurse, and we will plan to  see the patient again in 1 week to assess her progress.           ______________________________  Jonelle Sports Cheryll Cockayne, M.D.     RES/MEDQ  D:  06/25/2009  T:  06/26/2009  Job:  [id number]

## 2011-04-06 NOTE — Assessment & Plan Note (Signed)
[facility]   NAME:  [name], [name]           ACCOUNT NO.:  [id number]   MEDICAL RECORD NO.:  [id number]      DATE OF BIRTH:  January 31, 1925   PHYSICIAN:  Jonelle Sports. Sevier, M.D.  VISIT DATE:  10/30/2008                                   OFFICE VISIT   HISTORY:  This is an 77 year old black female who has had recurrent  stasis lesions on her lower extremities, and most recently, we have been  treating her for bilateral inframalleolar lesions on the medial aspect  of each foot.  We have used traditional methods and included Profore  wraps.   She arrives today reporting that she thinks she is doing well.  She has  had no problematic symptoms during the week, no difficulty with her  wraps.   PHYSICAL EXAMINATION:  VITAL SIGNS:  Blood pressure 130/78, pulse 71,  respirations 18, temperature 97.7.  EXTREMITIES:  Indeed the wound on the medial aspect of the right foot is  completely healed that on the left is somewhat smaller 1.1 x 1.1 x 0.1  cm, does have some crustiness at its margins.  There is no evidence of  significant infection.   IMPRESSION:  Satisfactory progress, bilateral stasis ulcerations.   DISPOSITION:  1. The wound on the right foot requires no treatment because it is      still a vulnerable area.  We will cover today with a self-adherent      Aleve and pad and advised her to leave that in place as long as it      will stay.  Once it comes off, she may leave it off altogether.      That extremity is returned to 30/40 compression stocking, which she      already has on hand and we will place for her here today.  2. The patient is instructed that once the dressing does come off, she      should begin working that area gently with some Vaseline or even      preferably cocoa butter to try to get rid of some of the tendency      toward hyperkeratinization of the skin in that area.  She      understands and accepts this recommendation.  3. The wound on  the left requires a limited selective debridement      without anesthesia of the crusted skin margins, and at the same      time, I did eliminate some of the hyperkeratinized material      extending downward from the wound at approximately 6-7 o'clock.  4. That extremity is then dressed with Silverlon, hydrogel, absorptive      pad, and Profore wrap.  5. Return followup visit will be here in 1 week.           ______________________________  Jonelle Sports. Cheryll Cockayne, M.D.     RES/MEDQ  D:  10/30/2008  T:  10/30/2008  Job:  [id number]

## 2011-04-06 NOTE — Assessment & Plan Note (Signed)
[facility]   NAME:  [name], [name]           ACCOUNT NO.:  [id number]   MEDICAL RECORD NO.:  [id number]      DATE OF BIRTH:  [birth year]   PHYSICIAN:  Barry Dienes. Eloise Harman, M.D.     VISIT DATE:                                   OFFICE VISIT   SUBJECTIVE:  The patient is an 77 year old black woman who was seen for  reevaluation of bilateral venous stasis ulcers.  This has been a chronic  issue for her.  In early Mar 29, 2009, the stasis ulcers on both legs had  healed and she was discharge from the clinic.  At that time, she  continued treatment with 20 - 30 open-toed compression stockings, which  were Knee High.  Despite these efforts, she began to notice ulceration  with exudate in the bilateral distal lower extremities.  She had not had  fever or chills.  She has made arrangements to see a vascular surgeon  for consideration of surgical improvement in venous insufficiency on  June 10, 2009.   OBJECTIVE:  VITAL SIGNS:  Blood pressure 157/70, pulse 80, respirations  18, temperature 98.  GENERAL:  The patient is a mildly overweight black woman who is no  apparent distress while sitting upright on a recliner chair.  She had  bilateral 1+ leg edema.   The condition of the wounds is as follows:  Wound #1 is located on the left medial ankle region and measures 0.2 cm  x 0.3 cm x 0.1 cm.  This wound does not have significant eschar, but has  minimal necrotic debris.  The wound base is red and yellow in color with  pink granulation tissue.  There was no exposed bone, tendon, or muscle  and no foul odor.  There was scant amount of serous drainage and intact  periwound integrity.   Wound #2:  Located on the right medial ankle and measures 0.4 cm x 0.4  cm x 0.1 cm.  This wound does not have significant eschar.  There was  minimal necrotic debris with red and yellow wound base and red  granulation tissue.  There was no exposed bone, tendon, or muscle and no  foul  odor.  There was a scant amount of serous drainage and an intact  periwound integrity.   ASSESSMENT:  Redevelopment of bilateral venous stasis ulcers with  history of chronic venous insufficiency.   PLAN:  After 5% lidocaine ointment was applied to both ulcers, a #15  scalpel was used to debride the necrotic debris from within the ulcers  and the surrounding tissue.  This was accomplished without significant  pain or bleeding.  Following this, each ulcer had a silver alginate  dressing applied, covered by Adaptic, covered by PRAFO light.  These  ulcer treatments could be changed once weekly.  At her vascular surgeon  evaluation on June 10, 2009, the [facility] light wraps can be removed.  She  will have a followup visit scheduled on Wednesday, June 11, 2009.           ______________________________  Barry Dienes. Eloise Harman, M.D.     DGP/MEDQ  D:  06/03/2009  T:  06/04/2009  Job:  [id number]

## 2011-04-06 NOTE — Assessment & Plan Note (Signed)
[facility]   NAME:  [name], [name]           ACCOUNT NO.:  [id number]   MEDICAL RECORD NO.:  [id number]      DATE OF BIRTH:  1924-12-21   PHYSICIAN:  Theresia Majors. Tanda Rockers, M.D. VISIT DATE:  08/31/2007                                   OFFICE VISIT   SUBJECTIVE:  [name] is an 77 year old lady who we have followed for  bilateral stasis ulcerations.  During the interim, she has worn an Conservator, museum/gallery on the left lower extremity and has procured and is now wearing a  compression hose on the right lower extremity.  There has been no  excessive drainage, malodor, pain or fever.   OBJECTIVE:  Blood pressure is 130/71, respirations 22, pulse rate 75,  temperature is 97.8.  She is accompanied by her daughter.  Inspection of  the lower extremity shows that the ulceration on the right medial leg is  completely resolved. Wound #5 on the left anterolateral leg remains  open, but there is healthy-appearing granulation. Pedal pulse remains  palpable. Chronic changes of stasis are persistent.  The edema is well  controlled as manifested by the lineal wrinkles.   ASSESSMENT:  Resolved stasis ulcer on the right lower extremity;  persistent stasis ulcer, but improving on the left.   PLAN:  We have made a transition on the right leg from compression wraps  to the use of compression hose.  We will continue the Unna wrap on the  left lower extremity and reevaluate the patient in 1 week.      Harold A. Tanda Rockers, M.D.  Electronically Signed     HAN/MEDQ  D:  08/31/2007  T:  08/31/2007  Job:  [id number]

## 2011-04-06 NOTE — Assessment & Plan Note (Signed)
[facility]   NAME:  [name], [name]           ACCOUNT NO.:  [id number]   MEDICAL RECORD NO.:  [id number]      DATE OF BIRTH:  1925/04/13   PHYSICIAN:  Theresia Majors. Tanda Rockers, M.D. VISIT DATE:  07/20/2007                                   OFFICE VISIT   SUBJECTIVE:  [name] is a 77 year old lady who we have followed for  bilateral stasis ulcers.  We have treated her with bilateral Unna boots  and SofSorbs.  She reports that she remains ambulatory without pain.  There is no excessive drainage, there is no malodor and there has been  no fever.   OBJECTIVE:  Blood pressure is 134/71, respirations 18, pulse rate 80,  temperature 97.6.  Inspection of the lower extremity shows that both  stasis ulcers 5 and 3 are decreasing in volume.  There is healthy  granulation tissue, no debridement is needed.  There is no evidence of  infection or arterial compromise.   ASSESSMENT:  Clinical response to compression, improvement.   PLAN:  Will continue the Unna boot protocol and reevaluate the patient  in 1 week.  We will compare the rate of healing and consider the  possibility of Apligraf at the next visit.      Harold A. Tanda Rockers, M.D.  Electronically Signed     HAN/MEDQ  D:  07/20/2007  T:  07/20/2007  Job:  [id number]

## 2011-04-06 NOTE — Assessment & Plan Note (Signed)
[facility]   NAME:  [name], [name]           ACCOUNT NO.:  [id number]   MEDICAL RECORD NO.:  [id number]      DATE OF BIRTH:  10-15-1925   PHYSICIAN:  Jonelle Sports. Sevier, M.D.  VISIT DATE:  12/18/2008                                   OFFICE VISIT   HISTORY:  This 77 year old black female is followed for a stasis  ulceration on the medial malleolar aspect of her left lower extremity  and also a paronychia on the medial aspect of the nail of the right  hallux.  We have been treating these in the traditional manner, actually  at one point excised the hypergranulation from the paronychia and have  subsequently been cauterizing it with silver nitrate.  She has been on  Levaquin since her last visit and has 2-3 more doses remaining.   She has no particular complaints this week, has tolerated her  compressive wrap on the left lower extremity well.  She has had no fever  or systemic symptoms.   PHYSICAL EXAMINATION:  Blood pressure 131/78, pulse 73, respirations 18,  and temperature 97.5.  The wound on the left medial ankle measures 2.4 x  1.5 x 0.2 cm which is a slight change in shape.  The paronychial area on  the right hallux is slightly smaller at 0.9 x 0.4 x 0.1 cm.   IMPRESSION:  Slow but satisfactory course wounds of lower extremity.   DISPOSITION:  Some soft tissue is excised selectively from the area of  the paronychia and again, the entire area is treated with an application  of silver nitrate.  It is dressed with Neosporin and a Band-Aid and she  is advised to cleanse the wound and redress it accordingly every 2 days.  She is wearing a compression stocking on that extremity.   The other wound that require some minimal debridement of some crusts and  slough at the wound margin and also a little loose skin where there has  been some maceration from drainage.  That wound is then redressed with  an application of Prisma, hydrogel, a dry pad and the  extremity returned  to Profore wrap.   The patient is instructed to complete her Levaquin which will last  another 3 days.   She will return for followup visit in 1 week.           ______________________________  Jonelle Sports. Cheryll Cockayne, M.D.     RES/MEDQ  D:  12/18/2008  T:  12/18/2008  Job:  [id number]

## 2011-04-06 NOTE — Assessment & Plan Note (Signed)
[facility]   NAME:  [name], [name]           ACCOUNT NO.:  [id number]   MEDICAL RECORD NO.:  [id number]      DATE OF BIRTH:  21-Oct-1925   PHYSICIAN:  Jonelle Sports. Sevier, M.D.  VISIT DATE:  03/26/2009                                   OFFICE VISIT   HISTORY:  This 77 year old black female is being followed for a stasis  ulceration on the medial malleolar area of her left lower extremity.  We  have used fairly conservative treatment and she has shown progressive  improvement.  Since she was last seen here 2 weeks ago the wound had  been treated with application of hydrogel, Silverlon, kept in place with  paper tape which the patient has re-dressed every 2 days.   She arrives today saying that she feels the wound is healed.  She has  noted no drainage, odor, swelling, fever or systemic symptoms.   EXAMINATION:  Blood pressure is 128/76, pulse 79, respirations 18,  temperature 98.1.  The wound on the left medial malleolar area is indeed  healed and epithelialized.  Obviously, it still is potentially quite  vulnerable.   DISPOSITION:  To cover that area with a beveled felt pad held in place  with paper tape however, she will continue to use her 20-30 open toed  compression stocking which is knee length.   We will allow her to return on a p.r.n. basis but otherwise consider her  healed and discharge.           ______________________________  Jonelle Sports. Cheryll Cockayne, M.D.     RES/MEDQ  D:  03/26/2009  T:  03/26/2009  Job:  [id number]

## 2011-04-06 NOTE — Assessment & Plan Note (Signed)
[facility]   NAME:  ALEECE, LOYD           ACCOUNT NO.:  [id number]   MEDICAL RECORD NO.:  [id number]      DATE OF BIRTH:  [birth year]   PHYSICIAN:  Joanne Gavel, M.D.         VISIT DATE:  06/11/2009                                   OFFICE VISIT   HISTORY OF PRESENT ILLNESS:  An 77 year old female with bilateral venous  stasis ulcers.  She has recently been seen by Vascular who did not think  she is a candidate for any surgical procedures.  She has several venous  ulcerations which have been present in the past and had been healed.   PHYSICAL EXAMINATION:  Temperature 98, pulse 80, respirations 12, and  blood pressure 132/75.  She has bilateral pitting edema 1+.  She has 2  right medial ankle ulcerations and 1 left medial ankle ulceration.  These are debrided of slough with minimal hemorrhage.   PLAN:  Change to Santyl and bilateral Unna boots.  See in 7 days.  Prescription for Vicodin was given and she was told basically to use it  at night if the pain is keeping her awake.      Joanne Gavel, M.D.  Electronically Signed     RA/MEDQ  D:  06/11/2009  T:  06/11/2009  Job:  [id number]

## 2011-04-06 NOTE — Assessment & Plan Note (Signed)
[facility]   NAME:  KARLEE, STAFF           ACCOUNT NO.:  [id number]   MEDICAL RECORD NO.:  [id number]      DATE OF BIRTH:  May 11, 1925   PHYSICIAN:  Jonelle Sports. Sevier, M.D.  VISIT DATE:  03/12/2009                                   OFFICE VISIT   HISTORY:  This 77 year old black female has been followed for bilateral  stasis ulcerations.  That on the left is healed some time ago but there  is a small in the right medial malleolar area that though progressively  shrinking has not yet healed.   For the past 2 weeks she has been treated simply with a compressive  wrap.   She reports no pain, fever, drainage, odor or any other problematic  symptoms.   EXAMINATION:  Blood pressure 134/74, pulse 80, respirations 20,  temperature 97.6.  The wound on the right ankle is indeed healed, that  on the medial malleolar area now measures 0.4 x 0.3 x 0.1 cm with single  layer epithelialization across the wound but really not complete  healing.   IMPRESSION:  Satisfactory course of stasis ulceration left lower  extremity.   DISPOSITION:  The wound will be dressed today with an application of a  tiny amount of hydrogel covered then with a Silverlon pad and this held  in place by a self adherent Allevyn pad.  She will be placed in her 30-4-  compression stocking.  She is to change the dressing every 2 days at  home, cleansing the wound gently and reapplying exactly what we have  done here.   Her followup visit will be here in 2 weeks in hope she will by then be  healed.           ______________________________  Jonelle Sports Cheryll Cockayne, M.D.     RES/MEDQ  D:  03/12/2009  T:  03/12/2009  Job:  [id number]

## 2011-04-06 NOTE — Assessment & Plan Note (Signed)
[facility]   NAME:  MANREET, KIERNAN           ACCOUNT NO.:  [id number]   MEDICAL RECORD NO.:  [id number]      DATE OF BIRTH:  07-23-1925   PHYSICIAN:  Theresia Majors. Tanda Rockers, M.D. VISIT DATE:  09/06/2007                                   OFFICE VISIT   SUBJECTIVE:  Ms. Odonohue returns for followup of stasis ulcer involving  the lateral left lower extremity.  In the interim, she has worn an Marketing executive.  There has been moderate drainage and foul odor, but appears to be  no warmth or fever.  She is accompanied by her daughter.   OBJECTIVE:  Blood pressure is 132/79, respirations 16, pulse rate 68,  temperature 97.5.  Inspection of the lower extremity showed that the  right medial leg ulcer, wound #3 is completely resolved.  On the left,  the lateral ulcer #5 is still present, there is 100% granulation with  advancing epithelium from the periphery.  There is no evidence of active  infection.  There is no exudate or malodor.   ASSESSMENT:  Clinically responding stasis ulcer.   PLAN:  We will continue the [facility] boot wrap, and re-evaluate the patient  in 1 week.      Harold A. Tanda Rockers, M.D.  Electronically Signed     HAN/MEDQ  D:  09/06/2007  T:  09/06/2007  Job:  [id number]

## 2011-04-06 NOTE — Assessment & Plan Note (Signed)
[facility]   NAME:  [name], [name]           ACCOUNT NO.:  [id number]   MEDICAL RECORD NO.:  [id number]      DATE OF BIRTH:  1925/08/02   PHYSICIAN:  Jonelle Sports. Sevier, M.D.  VISIT DATE:  11/27/2008                                   OFFICE VISIT   HISTORY:  This 77 year old black female has been followed for a stasis  ulceration on the medial malleolar aspect of her left lower extremity.  This occurring the phase also of some arterial insufficiency.   We have been treating this in the usual fashion, although she cannot use  complete compressive dressings.  We have ignored some dark tissue in the  base of the ulcer which I have thought was simply small capillaries and  venules.  However, she arrives today with the wound slightly larger and  with increase in this material and some odor to the wound.  Fortunately,  she has had no fevers, systemic symptoms, and there is no spreading  erythema.   She has developed as a new problem of a hypertrophic response to  otherwise fairly innocent paronychia on the medial aspect of the toe of  the right hallux.   PHYSICAL EXAMINATION:  VITAL SIGNS:  Blood pressure 149/77, pulse 75,  respirations 20, and temperature 97.6.  The wound on the left medial ankle measures 1.8 x 1.7 cm.  It is  approximately 0.1 cm in depth and the base is indeed slightly shaggy and  blackened and there is a significant odor.   On the right great toe, medial paronychial area, is a hypertrophic mass  measuring 0.9 x 0.4 cm.  There is no active drainage, no spreading  erythema.   IMPRESSION:  1. Paronychia with hypergranulation, right hallux.  2. Regressing wound, left medial malleolar area.   DISPOSITION:  1. Under anesthesia obtained with 5% lidocaine gel, the patient is      subjected to selective debridement of this slough from the left      wound base which indeed is malodorous.  The wound cleans up fairly      nicely, does bleed a  little bit.  The wound is cultured thereafter      and the wound base is covered with a fine coated gentamicin      ointment.  It is then treated with an application of Oasis and      Mepitel with again gentamicin placed on top of the Mepitel.  It is      then dressed in a dry dressing.   Likewise under 5% lidocaine gel preparation, the paronychial  hypertrophic mass is sharply excised from the right hallux and the area  cauterized with silver nitrate.  This was then dressed with an  application of gentamicin ointment as well and covered with a Band-Aid.   The patient is instructed to leave the left lower extremity dressing in  place until she is seen here again 5 days hence.   She is instructed to cleanse the right great toe with warm soapy water  each day, then rinse it well, and then place Neosporin ointment and a  Band-Aid.   As indicated above, followup visit here will be in 5 days.  ______________________________  Jonelle Sports Cheryll Cockayne, M.D.     RES/MEDQ  D:  11/27/2008  T:  11/27/2008  Job:  [id number]

## 2011-04-06 NOTE — Consult Note (Signed)
NAMESTEPHENI, CAMERON           ACCOUNT NO.:  [id number]   MEDICAL RECORD NO.:  [id number]          PATIENT TYPE:  REC   LOCATION:  FOOT                         FACILITY:  [facility]   PHYSICIAN:  Jonelle Sports. Sevier, M.D. DATE OF BIRTH:  [birth year]   DATE OF CONSULTATION:  10/04/2007  DATE OF DISCHARGE:                                 CONSULTATION   REASON FOR CONSULTATION:  This 77 year old black female is followed for  a venous stasis ulceration on the left lateral supramalleolar area,  which has been treated with applications of Unna wrap.  It was debrided  somewhat of its margins last week, and this was well tolerated.   The patient reports no interim pain, odor, discharge or fever or other  systemic symptoms.   PHYSICAL EXAMINATION:  Blood pressure 143/82, pulse 75, respirations 18,  temperature 98.0.   The wound itself now measures 2.0 x 0.7 x 0.1 cm and again has some  slight encrustation on the anteromedial margin of the wound.  There is  also some crusty accumulation just posterior to the proximal portion of  the wound.  There is no odor, no significant active drainage.   IMPRESSION:  Venous ulcer, left lower extremity with satisfactory  progress.   DISPOSITION:  1. The wound margin is selectively debrided of the encrustations as      mentioned as is the area posterior to the proximal wound.  The      wound base looks healthy and granular.   The wound is dressed with an application of hydrogel and the extremity  returned to an Unna wrap.   The patient will be seen in 1 week by the nurse for repeat wrap and in 2  weeks by a physician.           ______________________________  Jonelle Sports. Cheryll Cockayne, M.D.     RES/MEDQ  D:  10/04/2007  T:  10/05/2007  Job:  [phone]

## 2011-04-06 NOTE — Assessment & Plan Note (Signed)
[facility]   NAME:  SANAI, FRICK           ACCOUNT NO.:  [id number]   MEDICAL RECORD NO.:  [id number]      DATE OF BIRTH:  06-29-1925   PHYSICIAN:  Theresia Majors. Tanda Rockers, M.D. VISIT DATE:  07/13/2007                                   OFFICE VISIT   SUBJECTIVE:  Ms. Harshman is a 77 year old lady who we have followed for  bilateral stasis ulcers.  In the interim she has worn bilateral Unna  boots.  She returns for evaluation.  There is been no excessive  drainage, malodor, pain or fever.  In fact, she has been more freely  ambulatory.   OBJECTIVE:  Blood pressure is 139/73, respirations 16, pulse rate 78,  temperature 98.  Inspection of the wound shows that there has been  healthy-appearing granulation in both wounds #5 and 3.  There is  advancing epithelium from the periphery.  There is no excessive drainage  or malodor.  Neurologically, protective sensation remains intact and  there are bounding bilateral dorsalis pedis pulses.   ASSESSMENT:  Clinical improvement of stasis ulcers.   PLAN:  We will return the patient to Unna wraps and reevaluate her in 1  week.  This patient will be a candidate for an Apligraf placement if  there is not progressive decrease in area.      Harold A. Tanda Rockers, M.D.  Electronically Signed     HAN/MEDQ  D:  07/13/2007  T:  07/14/2007  Job:  [id number]

## 2011-04-06 NOTE — Assessment & Plan Note (Signed)
[facility]   NAME:  [name], [name]           ACCOUNT NO.:  [id number]   MEDICAL RECORD NO.:  [id number]      DATE OF BIRTH:  1925/06/02   PHYSICIAN:  Jonelle Sports. Sevier, M.D.       VISIT DATE:                                   OFFICE VISIT   HISTORY:  This 77 year old black female is followed for a small stasis  ulceration on the medial malleolar aspect of the left lower extremity  and also a paronychia with hypergranulation on the medial margin of the  right  __________ nail.   When seen here last week by Dr. Leanord Hawking, she was placed on Levaquin 250  mg a day, which she has since completed for her paronychia, and it was  treated with an application of Neosporin and a Band-Aid.  The primary  wound on the left ankle was managed with Prisma and Profore wrap.   She reports no new problems since the last visit here and specifically  no fever or systemic symptoms.  Her tolerance of the Levaquin has  apparently been good.   Blood pressure is 144/77, pulse 68, respirations 20, temperature 97.6.  The wound on the medial ankle is about the same and this measures 2.1 x  1.8 x 0.2 cm but has a healthy granular base and good margins.   The area of the right great toe shows again some recurrence of heaping  granulation measuring approximately 0.9 x 0.5 x 0.1 cm.  There is no  evidence of deep infection.  There is, however, somewhat of an odor to  that toe.   IMPRESSION:  Satisfactory progress, both wounds.   DISPOSITION:  1. The ankle wound requires no debridement today and will be treated      with a re-application of Prisma, hydrogel, SofSorb pad, and Profore      wrap.  2. The right great toe is again selectively debrided of the      hypergranulation tissue and then cauterized with silver nitrate      stick.  A small amount of the jagged corner of the toenail, which      apparently generated the problem, the first place is today removed.  3. The toe wound will be  dressed with an application of Neosporin and      a Band-Aid.  4. It is my assessment that the patient needs to continue on      antibiotics, and this will be changed for cost reasons to      doxycycline 100 mg 1 daily and she is given a total of 10.  5. Followup visit will be here in 1 week.            ______________________________  Jonelle Sports. Cheryll Cockayne, M.D.     RES/MEDQ  D:  12/11/2008  T:  12/11/2008  Job:  [id number]

## 2011-04-06 NOTE — Assessment & Plan Note (Signed)
[facility]   NAME:  [name], [name] NO.:  [id number]   MEDICAL RECORD NO.:  [id number]      DATE OF BIRTH:  1925-02-22   PHYSICIAN:  Ardath Sax, M.D.           VISIT DATE:                                   OFFICE VISIT   I debrided both of her ankles for her venous stasis ulcers, this is  difficult to do as she did not tolerate the local anesthesia, but they  look like they are cleaner than they did last week and there was no  odor, especially on the right, which is the larger, is needing some more  debridement, but I scrubbed it and curetted it a little bit as much as  she would tolerate and then we put on new bilateral Unna boots.      Ardath Sax, M.D.     PP/MEDQ  D:  07/02/2009  T:  07/03/2009  Job:  [id number]

## 2011-04-06 NOTE — Assessment & Plan Note (Signed)
[facility]   NAME:  [name], [name]           ACCOUNT NO.:  [id number]   MEDICAL RECORD NO.:  [id number]      DATE OF BIRTH:  02/27/1925   PHYSICIAN:  Jonelle Sports. Sevier, M.D.  VISIT DATE:  06/18/2009                                   OFFICE VISIT   HISTORY:  This 77 year old black female has been followed for venous  stasis ulcerations 2 on the medial right ankle and 1 on the medial  malleolar area of the left ankle.  She was seen during the past week by  Dr. Waverly Ferrari who feels that her arterial circulation is  adequate to heal these wounds.  Meanwhile, the patient and her daughter  have noted some increase in odor from the wounds of the right ankle.   She has had no fever, chills or systemic symptoms, however.   EXAMINATION:  Vital signs are not recorded on the sheet available to me  at this point in time.  Her three wounds measure on the left medial  malleolar area 1.6 x 1.0 x 0.1 cm and this has a reasonably semi-  gelatinous slough in the wound base.   The two wounds of the right ankle measured 2.1 x 2.0 x 0.3 cm and 2.0 x  1.0 x 0.3 cm respectively.  Both have drainage, malodorous and have  brownish slough in the bases.   IMPRESSION:  Deterioration in stasis ulcerations, particularly in the  right lower extremity with suspicion of superinfection.   DISPOSITION:  The wounds are debrided down on the left ankle with a Q-  tip and that those 2 on the right ankle with a wound curette and  following this cultures are made of both wounds.   Pending report of those cultures, we will hold antibiotics and that she  seems to be in no immediate distress or otherwise threatening situation.  Meanwhile the wounds themselves will be dressed with gentamicin  ointment, calcium alginate and Kerlix Coban wraps bilaterally which we  will ask the home health nurse to change three times weekly cleansing  the wounds at each dressing change.     ______________________________  Jonelle Sports. Cheryll Cockayne, M.D.     RES/MEDQ  D:  06/18/2009  T:  06/19/2009  Job:  [id number]

## 2011-04-06 NOTE — Assessment & Plan Note (Signed)
[facility]   NAME:  [name], [name]           ACCOUNT NO.:  [id number]   MEDICAL RECORD NO.:  [id number]      DATE OF BIRTH:  1925-02-04   PHYSICIAN:  Jonelle Sports. Sevier, M.D.  VISIT DATE:  03/05/2009                                   OFFICE VISIT   HISTORY:  This 77 year old black female was followed for venous stasis  ulcerations on the right ankle in the medial supramalleolar area and on  the left ankle medially at the malleolus.   Since her last visit, the right lower extremity has remained healed and  the ulcer on the left lower extremity, to her thinking has shown  improvement.  She had been largely free of pain and has had no other  worrisome symptoms, no odor, no fever or other systemic symptoms.   EXAMINATION:  Blood pressure 130/76, pulse 76, respirations not  recorded, temperature 97.6.   The right ankle is indeed adequately healed.   On the left ankle, the wound remains very superficial and is continuing  to contract, now measuring 0.8 x 0.6 x 0.1 cm.   IMPRESSION:  Stasis ulcer with resolution right lower extremity,  continued improvement in left lower extremity.   DISPOSITION:  The wounds require no debridement today.   The patient is brought along her custom compression stocking for her  right lower extremity, which appears adequate and she will be allowed to  depart in that today.   On the left lower extremity, the ulcers dressed with Silverlon, SofSorb,  and a PRAFO wrap.  She will be seen in 1 week by the nurse for dressing  change and in 2 weeks by the physician with anticipation.  She will  likely be healed and could be discharged at that time.           ______________________________  Jonelle Sports Cheryll Cockayne, M.D.     RES/MEDQ  D:  03/05/2009  T:  03/06/2009  Job:  [id number]

## 2011-04-06 NOTE — Assessment & Plan Note (Signed)
[facility]   NAME:  [name], [name]           ACCOUNT NO.:  [id number]   MEDICAL RECORD NO.:  [id number]      DATE OF BIRTH:  16-Mar-1925   PHYSICIAN:  Jonelle Sports. Sevier, M.D.  VISIT DATE:  01/29/2009                                   OFFICE VISIT   HISTORY:  This 77 year old black female is followed for a paronychial  wound on the medial aspect of the nail of the right hallux and also a  venous ulcer on the inframalleolar aspect of the medial left ankle.   Today, she reports a sore spot in the corresponding area on the right  lower extremity and also has some crusts, several centimeters proximal  to the ankle crease on the anterior aspect of the right lower extremity  as well.   She has been in a compressive wrap on the left and on topical treatments  to the hallucal paronychia.   EXAMINATION:  Blood pressure 147/78, pulse 74, respirations 18,  temperature 97.8.  The paronychial area is largely healed.  There is a  small amount of loose skin and some debris there and a tiny amount of  callus, but no evidence of persisting infection.   The primary wound on the left medial inframalleolar area now measures  1.2 x 1.1 cm, which is slightly smaller and slightly changing in shape  indicating contraction from a week ago.   The corresponding area on the right ankle is just a crusty area as are  those 2 areas proximal to the ankle crease previously described.   IMPRESSION:  Satisfactory albeit slow progress left lower extremity  venous ulceration, essential resolution of paronychia on right hallux, 2  new encrusted areas on the right lower extremity.   DISPOSITION:  The wound at the paronychial area on the right hallux is  cleaned up by selectively debriding away some of the debris crust and  callus in that area, leaving a nice clean wound.   The primary wound on the left ankle is debrided with a gentle scraping  with scalpel to remove the small amount of  slough and leaving a very  clean wound.   The areas of encrustation, both on the distal anterior pretibial area  and the median inframalleolar area on the right foot, are debrided of  those crusts and this yields no open ulceration.  There is some swelling  in that extremity.   The hallucal area is dressed with Neosporin and a Band-Aid, but this  will not need to be continued at home.   The areas from which I have removed crusts on that extremity are treated  with application of hydrogel, covered by Silverlon, and that extremity  is placed in a Profore wrap.   The wound on the left lower extremity is treated in a similar fashion  with hydrogel, Silverlon, and a Profore wrap.   Followup visit will be in 1 week for nurse dressing change and in 2  weeks to the physician.           ______________________________  Jonelle Sports. Cheryll Cockayne, M.D.     RES/MEDQ  D:  01/29/2009  T:  01/29/2009  Job:  [id number]

## 2011-04-06 NOTE — H&P (Signed)
[name], RANSIER NO.:  [id number]   MEDICAL RECORD NO.:  [id number]          PATIENT TYPE:  REC   LOCATION:  FOOT                         FACILITY:  [facility]   PHYSICIAN:  Maxwell Caul, M.D.DATE OF BIRTH:  1925/03/12   DATE OF ADMISSION:  06/02/2009  DATE OF DISCHARGE:                              HISTORY & PHYSICAL   HISTORY:  Mrs. Tullo is a patient we have been following here for  bilateral venous stasis ulcers.  She has a significant wound on the  right medial leg and a lesser wound on the left.  Most recently, she has  been applying Santyl for the wound on her right and Puracol AG to the  one on the left, both have been put in an Unna wrap.  These are being  changed by Home Health.  There is some suggestion that she has been  noncompliant with the wraps at home and therefore is changed from home  health companies.   PHYSICAL EXAMINATION:  She is afebrile.  The wound on the right medial  leg measures 8.5 x 3.4 x 0.2.  The degree of surface eschar here is  improved.  She underwent a gentle nonexcisional debridement of surface  slough.  There is some suggestion of periwound maceration and the  drainage is apparently somewhat malodorous.  I did a blind culture of  the wound but I did not start her on empiric antibiotics, however, I did  change her to a silver alginate dressing.   The wound on the left medial leg is 2.8 x 1.4 x 0.1.  This looks like a  healthier wound.  I am going to continue this on a collagen-based  dressing.   IMPRESSION:  Bilateral venous stasis disease.  I have changed her from a  Santyl-based dressing to silver alginate which we will continue under an  Unna wrap.  The one on the left we are continuing with collagen, Adaptic  under an Radio broadcast assistant.  These orders will need to be called in for Home  Health, although I could not really identify who the Home Health Company  was at this point.  We will see her again in 2 weeks.        ______________________________  Maxwell Caul, M.D.     MGR/MEDQ  D:  07/24/2009  T:  07/25/2009  Job:  [id number]

## 2011-04-06 NOTE — Assessment & Plan Note (Signed)
[facility]   NAME:  [name], [name] NO.:  [id number]   MEDICAL RECORD NO.:  [id number]      DATE OF BIRTH:  [birth year]   PHYSICIAN:  Maxwell Caul, M.D. VISIT DATE:  12/02/2008                                   OFFICE VISIT   [name] is a lady that we have been following for a largely stasis  ulceration over her left medial malleolar area.  This had been debrided  last time and treated with Oasis and Mepitel.  We have also been  following a fairly significant paronychia on the right hallux, on the  lateral aspect.  She has been treating this with antibacterial soap  washes and gentamicin topical with a Band-Aid covering.  In the  meantime, she had a culture done from the malleolar area on the left  that showed Pseudomonas.  This is fortunately fairly pansensitive  including quinolones.  We are seeing her today in followup.   On examination, her temperature is 97.6, pulse 69, respirations 18,  blood pressure is 121/74.  The wound on the left medial ankle underwent  a difficult debridement.  It was covered with a densely fibrous thick  adherent eschar that was very difficult to remove.  Even with topical  lidocaine 5%, LET, and an injectable lidocaine, I had difficulty  controlling her level of discomfort; therefore, full debridement of this  area was not achieved.  There was still a fair amount of eschar on the  wound; therefore, I did not think it would be worth putting Oasis back  on this.   The area on the right great toe continues to look unchanged by  description.  This either could contain acute bacteria and/or fungus  (nail is actually mycotic).   IMPRESSION:  1. Largely stasis ulceration on the left medial ankle.  This underwent      a difficult series of debridements with the patient.  As I could      not really clean up the wound area properly, I did not reapply the      Oasis.  We did apply Prisma and hydrogel.  There  was no evidence of      surrounding infection here, nevertheless, with Pseudomonas.  I went      ahead and put her on Levaquin orally, as well as the silver and the      Prisma should clear this up nicely.  There was really, however,      fortunately no evidence of surrounding cellulitis and at least in      terms of her infection component, her pain and drainage seem      better.  2. Paronychia of right great toe:  The absence of pain here makes me      think that this is probably something else other than acute      bacteria.  We continued, however, with epsom salts, foot soaks,      topical gentamicin, and a dry dressing.  This ultimately might      require antifungals and/or removal of the medial aspect of the      nail.   We will see her again next week.           ______________________________  Casimiro Needle  Bartholome Bill, M.D.     MGR/MEDQ  D:  12/02/2008  T:  12/02/2008  Job:  [id number]

## 2011-04-06 NOTE — Assessment & Plan Note (Signed)
[facility]   NAME:  [name], [name] NO.:  [id number]   MEDICAL RECORD NO.:  [id number]      DATE OF BIRTH:  [birth year]   PHYSICIAN:  Maxwell Caul, M.D.      VISIT DATE:                                   OFFICE VISIT   [name] is an 77 year old woman who has been followed here for severe  bilateral venous stasis ulcers.  Most recently, she has been receiving  bilateral Unna boots.  She has very significant need for debridement,  but does not tolerate this all that well.   On examination, she is afebrile.  The area of most impressive ulceration  is on the right medial ankle.  This measures 7.9 x 2.8 x 0.3.  It has a  fair amount of adherent black eschar at its superior and inferior  recess.  We anesthetized this with LET and attempted debridement, but  she does not tolerate it particularly well.  The area over the left  medial ankle is a healthy-looking wound measuring 2 x 1.5 x 0.1.  I  removed some callus from around the circumference of this wound, but the  surface of the wound did not require debridement.  There is no evidence  of surrounding infection.   IMPRESSION:  Bilateral venous stasis disease.  To the right medial  ankle, 2e applied Santyl under Adaptic and an Radio broadcast assistant.  To the room  left medial ankle, we applied Puracol AG and Adaptic under an Radio broadcast assistant.  We will see this again in 2 weeks.  We will call [facility] to  change the dressings in the interim until the area on the right is going  to require serial mechanical and chemical debridement.           ______________________________  Maxwell Caul, M.D.     MGR/MEDQ  D:  07/10/2009  T:  07/11/2009  Job:  [id number]

## 2011-04-09 NOTE — Consult Note (Signed)
NAME:  [name], [name]                ACCOUNT NO.:  0   MEDICAL RECORD NO.:  [id number]            PATIENT TYPE:   LOCATION:                                 FACILITY:   PHYSICIAN:  Jonelle Sports. Sevier, M.D.      DATE OF BIRTH:   DATE OF CONSULTATION:  08/19/2005  DATE OF DISCHARGE:                                   CONSULTATION   HISTORY:  This 77 year old black female is seen at the courtesy of Dr.  Elisabeth Most for assistance with management of chronic ulcerations of the lower  extremities.   The patient has a history of deep vein thrombosis in the right lower  extremity in the early 1990s since which time she has been troubled with  repeated episodes of venous ulceration on the medial aspect of her right  lower extremity in the supramalleolar area. Generally, these have healed  with fairly conservative treatment but she has had one now that has been  active for almost a year and was treated originally at a wound care center  in [place], [place]. Since her last visit there, which has been  several months ago, she has been seen at home here by home health and she  has been treated with Silverlon and Profore light wraps to both lower  extremities. (Approximately 4-6 weeks ago, she developed a small ulceration  on the medial malleolar area of the left lower extremity as well).   With her failure to make significant progress with her right lower extremity  wound in particular, she is referred now for further evaluation and advice  here. Dr. Elisabeth Most has raised a question that perhaps hyperbaric oxygen  therapy would benefit her.   PAST MEDICAL HISTORY:  1.  As indicated, it was notable for right deep vein thrombosis in early      1990s.  2.  She had mastectomy in 1994 for the left breast cancer and apparently has      had no recurrence.  3.  She does have hypertension which is under treatment.  4.  She is chronically anxious.  5.  She has degenerative disk disease in the  cervical spine.  6.  She has had a partial hysterectomy.   ALLERGIES:  She has no known medicinal allergies.   MEDICATIONS:  Her regular medications include:  1.  Keflex 500 mg t.i.d. which she has been on for the past seven days and      will be finished today.  2.  Trazodone 50 mg at bedtime.  3.  Aspirin 325 mg daily.  4.  Amiloride/hydrochlorothiazide 5/50 mg daily.  5.  Tamoxifen 10 mg b.i.d.  6.  K-Dur 10 mEq b.i.d.  7.  Advil 400 mg b.i.d.  8.  Eye drops.  9.  P.r.n. Vicodin for pain in association with her venous ulceration.   PHYSICAL EXAMINATION:  Examination today is limited to the distal lower  extremities. Both lower extremities are mild to moderately edematous this  extending to the knees. There is no major deformity or prior amputation.  Her pulses are everywhere  palpable although less strong on the right than on  the left.  Her toenails are mildly dystrophic.  Monofilament testing shows  that she has some unreliability in responses but seems to be protective  throughout both feet.   On the left lower extremity at the medial malleolar area is an ulcer  measuring 1.6 x 2.2 x 0.1 centimeters, this after debridement.  It has a  pink granular base but slightly macerated callused margins.   On the right lower extremity in the malleolar and supramalleolar area  extending linearly for 16.7 cm and transverse for 2.0 cm is an area of  chronic fibrotic skin in which there are several areas of open ulceration;  the more distal to of these being rather superficial; the more proximal  being approximately 0.15 cm in depth. There is a great deal of keratotic  skin slough which has not been removed over these wounds and this is done  with the measurements as indicated above being after such debridement.   IMPRESSION:  Chronic venous ulcerations, bilateral lower extremities.   DISPOSITION:  1.  The patient is studied with handheld Doppler examinations here on site,      which  revealed biphasic signals at both the dorsalis pedis and posterior      tibial positions on the right, although that at the posterior tibial is      slightly damped. On the left, the dorsalis pedis pulses triphasic and      the posterior tibial biphasic.  2.  The wounds were both debrided of loose skin which is extensive on the      right lower extremity in particular. The measurements as indicated above      follow such debridements. The wound is further cleansed of some slough      which was hidden under these areas once they have been debrided.  3.  The wounds are then covered with Silvadene cream in general and this is      overlain with a Silverlon dressing in the areas of open ulceration. Dome      paste wrap is use on the right lower extremity and then covered by      Profore wrap.  Left lower extremity is covered with Profore wrap alone.  4.  These dressings will be changed and replaced in one week by home health      nurse and follow-up visit will be to this facility in two weeks.  5.  The patient is advised that, once her current antibiotics are completed,      we do not presently plan to extend them further.           ______________________________  Jonelle Sports. Cheryll Cockayne, M.D.     RES/MEDQ  D:  08/19/2005  T:  08/19/2005  Job:  [id number]   cc:   Lovenia Kim, D.O.  Fax: [phone]

## 2011-04-09 NOTE — Assessment & Plan Note (Signed)
[facility]   NAME:  [name], [name]           ACCOUNT NO.:  [id number]   MEDICAL RECORD NO.:  [id number]      DATE OF BIRTH:  [birth year]   PHYSICIAN:  Theresia Majors. Tanda Rockers, M.D. VISIT DATE:  08/23/2006                                     OFFICE VISIT   SUBJECTIVE:  [name] is a 77 year old lady who returns for follow up of a  right lower extremity stasis ulcer. In the interim she has been treated with  external compression. She has had moderate to scant drainage and no  excessive malodor. She has complained of some mild pain which has responded  to elevation.   OBJECTIVE:  Blood pressure 140/80, respirations 18, pulse rate 72 and she is  afebrile. Inspection of the wound shows a persistence of the chronic stasis  changes. The edema is reasonably controlled with the current wrap as  manifested by the wrinkling of the skin. The wound itself has a flat contour  with evidence of granulation and scant drainage. There is no malodor. There  is some dryness at the periphery with some desquamated skin.   ASSESSMENT:  Improved stasis ulcer.   PLAN:  We have added a Hydrogel and triamcinolone ointment to the skin and  we will replace the Uniwrap. We will reevaluate the patient in one week.           ______________________________  Theresia Majors. Tanda Rockers, M.D.     [name]  D:  08/23/2006  T:  08/24/2006  Job:  [id number]

## 2011-04-09 NOTE — Op Note (Signed)
[name], [name]           ACCOUNT NO.:  [id number]   MEDICAL RECORD NO.:  [id number]          PATIENT TYPE:  AMB   LOCATION:  DSC                          FACILITY:  [facility]   PHYSICIAN:  Angelia Mould. Derrell Lolling, M.D.DATE OF BIRTH:  [birth year]   DATE OF PROCEDURE:  02/04/2006  DATE OF DISCHARGE:  02/04/2006                                 OPERATIVE REPORT   PREOPERATIVE DIAGNOSIS:  Ductal carcinoma in situ, right breast.   POSTOPERATIVE DIAGNOSIS:  Ductal carcinoma in situ, right breast.   OPERATION PERFORMED:  Re-excision superior margin of partial mastectomy  cavity.   SURGEON:  Angelia Mould. Derrell Lolling, M.D.   OPERATIVE INDICATIONS:  This is a 77 year old black female who underwent a  right partial mastectomy approximately 10 days ago for a biopsy proven  ductal carcinoma in situ. There was a question of a very close superior  margin less than 1 mm. This was discussed in our breast multidisciplinary  conference.  Our pathologist stated that this was an isolated focus of  ductal carcinoma in situ and was not a multifocal disease.  They said it was  just a close margin at the superior rim. The patient is returned the  operating room for re-excision of the superior margin.   OPERATIVE TECHNIQUE:  Following induction of a general LMA anesthetic, the  patient's right breast was prepped and draped in sterile fashion. The  transverse elliptical incision was made in the medial portion of the right  breast, excising the old scar. I entered the lumpectomy cavity and then  using the traction and counter traction, very carefully excised the superior  rim of the biopsy cavity trying to take about 1.0 cm thickness of tissue  everywhere. I took this dissection all the way down to the chest wall as had  been done previously. The biopsy cavity was marked with a purple Vicryl  suture. The superior medial margin and the superior lateral margin were  marked separately with silk sutures and the  specimen was sent to lab and the  case and the orientation of the specimen was discussed with Dr. Jimmy Picket. Hemostasis was excellent and achieved with electrocautery. The  wound was irrigated with saline. I undermined the breast tissue superiorly  and inferiorly so as to achieve a layered closure. I closed the breast  tissue in two layers with interrupted sutures of 2-0 Vicryl. About six  sutures were required to close the deep layer of breast tissue and about  five Vicryl sutures were required to close the more superficial aspect of  breast tissue and skin was closed with a running suture of 4-0 nylon. Clean  bandages were placed. The patient taken to recovery room in stable  condition. Estimated blood loss was about 15-20 mL. Complications were none.  Sponge, needle and instrument counts were correct.   SUMMARY:      Angelia Mould. Derrell Lolling, M.D.  Electronically Signed     HMI/MEDQ  D:  02/04/2006  T:  02/07/2006  Job:  [id number]   cc:   Lovenia Kim, D.O.  Fax: [phone]   Jonelle Sports. Cheryll Cockayne, M.D.  Fax: [phone]

## 2011-04-09 NOTE — Assessment & Plan Note (Signed)
[facility]   NAME:  [name], [name]           ACCOUNT NO.:  [id number]   MEDICAL RECORD NO.:  [id number]            DATE OF BIRTH:   PHYSICIAN:  Theresia Majors. Tanda Rockers, M.D. VISIT DATE:  09/20/2006                                     OFFICE VISIT   VITAL SIGNS:  Her blood pressure is 130/72, respirations 16, pura 64 and she  is afebrile.   PURPOSE OF TODAY'S VISIT:  [name] has been following in the wound center  for bilateral stasis ulcers.  She has been managed with an Una wrap.  During  the interim, she reports that she has had no fever, no excessive drainage  and no malodor.   WOUND EXAM:  Inspection of the lower extremity shows there is a persistence  of chronic stasis changes.  The ulcers, themselves, have markedly decreased.  There is no drainage.  There is 98% epithelization of both ulcers.   ASSESSMENT:  Improved stasis ulcers.   PLAN:  We will continue the patient's doxycycline and sulfur until the  course is completed.  We will replace her in [place] wraps.  We have advised her  to bring her compression hose with her during her next visit.  We will  follow her up in 10 days.  We anticipate that she would be completely  healed.           ______________________________  Theresia Majors Tanda Rockers, M.D.     [name]  D:  09/20/2006  T:  09/20/2006  Job:  [id number]

## 2011-04-09 NOTE — Op Note (Signed)
NAMEJAINA, MORIN           ACCOUNT NO.:  [id number]   MEDICAL RECORD NO.:  [id number]          PATIENT TYPE:  AMB   LOCATION:  DSC                          FACILITY:  [facility]   PHYSICIAN:  Angelia Mould. Derrell Lolling, M.D.DATE OF BIRTH:  13-Jun-1925   DATE OF PROCEDURE:  01/26/2006  DATE OF DISCHARGE:                                 OPERATIVE REPORT   PREOPERATIVE DIAGNOSIS:  Ductal carcinoma in situ, right breast.   POSTOPERATIVE DIAGNOSIS:  Ductal carcinoma in situ, right breast.   OPERATION PERFORMED:  Right partial mastectomy with needle localization and  specimen mammogram.   SURGEON:  Angelia Mould. Derrell Lolling, M.D.   OPERATIVE INDICATIONS:  This is a 77 year old black female who had a left  mastectomy in 1994 for breast cancer, details unknown. Interestingly, she  has continued  to take tamoxifen ever since for a total of 13 years. She  recently  had a screening mammogram and image guided biopsy of a small area  of microcalcifications in the right breast lower inner quadrant. This  revealed ductal carcinoma in situ of the solid and cribriform type. She  refused MRI. She is brought to operating room for partial mastectomy with  needle localization.   OPERATIVE TECHNIQUE:  The patient underwent needle localization at the  [facility] by Dr. Christiana Pellant. The localizing wire is in  the medial right breast at about the 3:30 to 4 o'clock position. General  anesthesia was used. The right breast was prepped and draped in sterile  fashion. 0.5% Marcaine with epinephrine was used as a local infiltration  anesthetic. A transverse skin crease incision was made in the right breast  medially going through the insertion site of the wire. Dissection was  carried down into the breast tissue and then widely around the localizing  wire all the way down to chest wall in some areas. The specimen was marked  with silk sutures to orient the radiologist and pathologist. Dr. Christiana Pellant  looked at the specimen mammogram and said that we had satisfactory  excision with good margins all around. The specimen sent for routine  histology. The wound was irrigated with saline. Hemostasis was excellent and  achieved with electrocautery. We irrigated the wound again. The superficial  subcutaneous tissue was closed with five interrupted sutures of 3-0 Vicryl.  The skin was closed with running subcuticular suture of 4-0 Monocryl and  Steri-Strips. Clean bandages were placed. The patient taken recovery room in  stable condition. Estimated blood loss was about 10 mL. Complications were  none.  Sponge, needle and instrument counts were correct.      Angelia Mould. Derrell Lolling, M.D.  Electronically Signed     HMI/MEDQ  D:  01/26/2006  T:  01/27/2006  Job:  [id number]   cc:   Lovenia Kim, D.O.  Fax: [phone]

## 2011-04-09 NOTE — Assessment & Plan Note (Signed)
[facility]   NAME:  [name], [name]           ACCOUNT NO.:  [id number]   MEDICAL RECORD NO.:  [id number]      DATE OF BIRTH:  04/12/1925   PHYSICIAN:  Theresia Majors. Tanda Rockers, M.D. VISIT DATE:  08/30/2006                                     OFFICE VISIT   VITAL SIGNS:  Her blood pressure is 140/65, respirations 16, pulse rate 78,  and she is afebrile.   PURPOSE OF TODAY'S VISIT:  [name] is a 77 year old lady who returns for  followup of bilateral stasis with an ulceration on her right lower  extremity.  In the interim, she reports that she has developed a new  breakdown on her left medial ankle.  She denies fever.  She denies chest  pain or progressive shortness of breath.  She continues to take her  medications as per order by Dr. Elisabeth Most.   WOUND EXAM:  Inspection of the lower extremity shows a persistence of  chronic stasis changes bilateral with 2 to 3+ edema bilaterally.  On the  right medial lower extremity, there are full thickness stasis ulcers with  areas of desquamation and necrosis, which were full thickness debrided using  a topical lidocaine ointment.  On the medial left ankle, there is a new area  of wound which was full thickness debrided of nonviable necrotic tissue and  which was controlled with direct pressure.  A #10 blade was used to debride  both wounds.   MANAGEMENT PLAN & GOAL:  We will continue the [facility] boot protocol.  We will  reevaluate the patient in 1 week.           ______________________________  Theresia Majors. Tanda Rockers, M.D.     [name]  D:  08/30/2006  T:  08/30/2006  Job:  [id number]

## 2011-04-09 NOTE — Assessment & Plan Note (Signed)
[facility]   NAME:  [name], [name]           ACCOUNT NO.:  [id number]   MEDICAL RECORD NO.:  [id number]      DATE OF BIRTH:  [birth year]   PHYSICIAN:  Theresia Majors. Tanda Rockers, M.D. VISIT DATE:  10/03/2006                                     OFFICE VISIT   SUBJECTIVE:  Ms. Asante is a 77 year old lady who we follow for bilateral  stasis.  She has been treated with compression wraps and emollients to the  skin with success.  She reports that there is no active ulceration at  present.  She has purchased and has in her possession today prescribed  bilateral below-the-knee 30 to 40 mm compression hose.   OBJECTIVE:  Blood pressure 120/85, respirations 16, pulse rate 85,  temperature 97.5.  Inspection of her lower extremities shows that there are  areas of desquamation, chronic changes of stasis; there is trace edema, and  there are absolutely no ulcers.   ASSESSMENT:  Resolved bilateral stasis.   PLAN:  We are discharging the patient.  We have given her a prescription for  triamcinolone cream 0.25%; she is to apply this nightly and wear a cotton  sock.  She is to bathe in the morning and re-apply and wear her support hose  throughout the day.  She is not to sleep in the support hose.  The patient  and her daughter seem to understand these instructions.  We are also  suggesting that she keep her routine medical appointments with her primary  care physician, Dr. Marisue Brooklyn.  We will see her on a p.r.n. basis.           ______________________________  Theresia Majors Tanda Rockers, M.D.     [name]  D:  10/03/2006  T:  10/04/2006  Job:  [id number]

## 2011-04-09 NOTE — Consult Note (Signed)
NAMEVERONNICA, [name]           ACCOUNT NO.:  [id number]   MEDICAL RECORD NO.:  [id number]          PATIENT TYPE:  REC   LOCATION:  FOOT                         FACILITY:  [facility]   PHYSICIAN:  Jonelle Sports. Sevier, M.D. DATE OF BIRTH:  1925-04-04   DATE OF CONSULTATION:  09/06/2006  DATE OF DISCHARGE:                                   CONSULTATION   HISTORY:  This 77 year old black female is seen for ongoing management of  venous stasis ulcerations, one on the medial gaiter area of the right  lower extremity and another on the medial aspect of the left heel.   When she was most recently seen a week ago, culture done prior to that had  shown growth of MRSA on the right lower extremity, but it was felt that this  was probably just colonization and no antibiotics were given.   The patient today reports more soreness in her wounds and feels that  something has changed unfavorably.  She has had no fever or systemic  symptoms.  She has been in Unna wrap so she has no assessment of whether  there has been significant change in drainage.   PHYSICAL EXAMINATION:  VITAL SIGNS:  Blood pressure 130/80, pulse 84  regular, respirations 16, temperature 98.   On the right lower extremity in the gaiter area is two areas of ulceration  measuring in total 4.1 x 1.9 cm.  There is some considerable maceration of  the scaly skin around those wounds and a rather soupy appearance to the  surface of the wounds themselves.  There is a mild odor.  There has been no  significant epithelialization.   On the medial aspect of the left ankle again is a venous ulcer measuring 2 x  2 and 0.1 cm in depth; here again, there is some slightly soupy appearance  and some maceration of the wound margins.   IMPRESSION:  Chronic venous insufficiency with stasis ulcerations both lower  extremities, slightly worse perhaps due to low grade methicillin resistant  Staphylococcus aureus infection.   DISPOSITION:  Both wounds  are partial-thickness debrided with removal of the  macerated scaly skin alongside and this is sharply debrided away without  incident.  One small fresh air ulceration is uncovered on the right lower  extremity just distal and lateral to the other lesions.   Both wounds be treated with an application of Silverlon.  This will be  covered by SofSorb pads and both extremities will be placed again in Unna  wraps.   Follow-up visit will be here in one week.   The patient is given a prescription for doxycycline 100 mg to take two  initially then one daily and in addition, Septra DS to be taken one b.i.d.,  given enough of both of these for 2 weeks therapy.   Follow-up visit will be here in one week.           ______________________________  Jonelle Sports. Cheryll Cockayne, M.D.     RES/MEDQ  D:  09/06/2006  T:  09/07/2006  Job:  [id number]

## 2011-04-09 NOTE — Assessment & Plan Note (Signed)
[facility]   NAME:  [name], [name]           ACCOUNT NO.:  [id number]   MEDICAL RECORD NO.:  [id number]      DATE OF BIRTH:  [birth year]   PHYSICIAN:  Theresia Majors. Tanda Rockers, M.D.      VISIT DATE:                                     OFFICE VISIT   SUBJECTIVE:  [name] is a 77 year old female who presents with stasis  ulcerations.  In the interim, she has been managed with Elveria Rising, Lafe Garin.  She has had a positive culture for MRSA and has been started on doxycycline  and Septra.  She continues to take this medication.  She denies excessive  drainage, malodor, pain or fever.   OBJECTIVE:  Blood pressure 122/67, respirations 16, pulse rate 67 and she is  afebrile.  Inspection of the wounds on the right lower extremity shows that  there is 100% granulating base that appears to be clean with no evidence of  active infection or malodorous drainage.  There is no necrosis.  No  debridement is necessary.  On the left medial malleolar ulcer, there is a  thickened necrotic area which underwent a full-thickness debridement with  EMLA cream as a topical anesthetic.  Hemorrhage was controlled with direct  pressure.   IMPRESSION:  Overall improvement of bilateral stasis ulcers.   PLAN:  We have resumed the [facility], we have cleansed the wounds with  Anasept Rinse and applied Anasept Gel to decrease the bacterial load, we  will continue compression therapy with the Elveria Rising and reevaluate the  patient in a week.           ______________________________  Theresia Majors. Tanda Rockers, M.D.     [name]  D:  09/13/2006  T:  09/13/2006  Job:  [id number]

## 2011-04-09 NOTE — Assessment & Plan Note (Signed)
[facility]   NAME:  [name], [name]           ACCOUNT NO.:  [id number]   MEDICAL RECORD NO.:  [id number]      DATE OF BIRTH:  May 03, 1925   PHYSICIAN:  Theresia Majors. Tanda Rockers, M.D. VISIT DATE:  08/17/2006                                     OFFICE VISIT   VITAL SIGNS:  Blood pressure is 120/80, respirations are 70, pulse rate 74  and she is afebrile. The respirations are 20.   PURPOSE OF TODAY'S VISIT:  Ms. Gouge returns for follow up of a stasis  ulcer involving the medial aspect of her right lower extremity.  During the  interim, she has worn a compressive wrap.  She reports that there has been  some drainage through the wound but there has been no pain and no fever.   WOUND EXAM:  The inspection of the wound shows that there is moderate amount  of exudate with nonviable necrotic tissue.  A full-thickness debridement was  performed with a #10 blade with hemorrhage control with direct pressure.  The patient had a culture which showed MRSA but there is no evidence of  ascending infection.   DIAGNOSIS:  Improved stasis ulcer.   MANAGEMENT PLAN & GOAL:  We have cleansed the wound with Anasept rinse and  applied Anasept gel.  We will proceed with a Una wrap for compression and  reevaluate the patient in one week.           ______________________________  Theresia Majors. Tanda Rockers, M.D.     [name]  D:  08/17/2006  T:  08/19/2006  Job:  [id number]

## 2011-04-09 NOTE — Assessment & Plan Note (Signed)
[facility]   NAME:  [name], [name]           ACCOUNT NO.:  [id number]   MEDICAL RECORD NO.:  [id number]            DATE OF BIRTH:   PHYSICIAN:  Theresia Majors. Tanda Rockers, M.D. VISIT DATE:  09/20/2006                                     OFFICE VISIT   VITAL SIGNS:  Blood pressure is 130/72, respirations 16, pura 64 and she is  afebrile.   PURPOSE OF TODAY'S VISIT:  [name] is a 77 year old lady who have been  following for bilateral stasis ulcers.  During the interim, she has been  managed with an Una wrap.  She reports that there is decreased drainage, no  pain, fever or swelling.   WOUND EXAM:  Inspection of the lower extremity shows that there has been  decreased volume, decreased inflammation of both ulcerations.  No  debridement was needed.  There is near complete reepithelization.   ASSESSMENT:  Improvement of stasis ulcerations, responsive to external  compression.   PLAN:  The patient will continue her suffer and doxycycline until completely  exhausted and we will replace her in an [place] boot for 10 days.  We even  advised her to bring her 30 to 40 mm below the knee compression hose during  her next visit, as we anticipate that she will be completely resolved.           ______________________________  Theresia Majors Tanda Rockers, M.D.     [name]  D:  09/20/2006  T:  09/20/2006  Job:  [id number]

## 2011-04-09 NOTE — Assessment & Plan Note (Signed)
[facility]   NAME:  [name], PATTON           ACCOUNT NO.:  [id number]   MEDICAL RECORD NO.:  [id number]       DATE OF BIRTH:  08/09/2004   PHYSICIAN:  Theresia Majors. Tanda Rockers, M.D.      VISIT DATE:                                     OFFICE VISIT   VITAL SIGNS:  Blood pressure was not taken.  Her respirations 18.  A pulse  rate of 70.  She is afebrile.   PURPOSE OF TODAY'S VISIT:  [name] is a 77 year old female who has been  treated in the past with stasis ulceration.  Over the last 6 weeks, she has  had an ulcer on the medial aspect of the right lower extremity which was  initially associated with trauma.  The wound has failed to heal.  It has  been associated with malodorous drainage but no fever.   WOUND EXAM:  The exam of the right lower extremity shows that there is 3+  edema with hyperpigmentation and extensive desquamation associated with a  central ulceration on the medial aspect of the extremity.  This area was  anesthetized with topical lidocaine and, thereafter, a short debridement was  performed with a 10-blade.  Hemorrhage was controlled with direct pressure.  Cultures were taken from the base of the wound.   WOUND SINCE LAST VISIT:   CHANGE IN INTERVAL MEDICAL HISTORY:   DIAGNOSIS:  Stasis ulcer, adequately debrided.   TREATMENT:   ANESTHETIC USED:   TISSUE DEBRIDED:   LEVEL:   CHANGE IN MEDS:   COMPRESSION BANDAGE:   OTHER:   MANAGEMENT PLAN & GOAL:  We placed the patient in an Unnaboot with a silver  matrix dressing.  We will reevaluate her in 1 week.           ______________________________  Theresia Majors. Tanda Rockers, M.D.     [name]  D:  08/09/2006  T:  08/10/2006  Job:  [id number]

## 2011-04-15 ENCOUNTER — Other Ambulatory Visit (HOSPITAL_COMMUNITY): Payer: Self-pay | Admitting: Oncology

## 2011-04-15 ENCOUNTER — Encounter (HOSPITAL_BASED_OUTPATIENT_CLINIC_OR_DEPARTMENT_OTHER): Payer: Medicare Other | Admitting: Oncology

## 2011-04-15 DIAGNOSIS — Z853 Personal history of malignant neoplasm of breast: Secondary | ICD-10-CM

## 2011-04-15 DIAGNOSIS — Z170 Estrogen receptor positive status [ER+]: Secondary | ICD-10-CM

## 2011-04-15 DIAGNOSIS — D0590 Unspecified type of carcinoma in situ of unspecified breast: Secondary | ICD-10-CM

## 2011-04-15 LAB — CBC WITH DIFFERENTIAL/PLATELET
BASO%: 0.4 % (ref 0.0–2.0)
EOS%: 0.5 % (ref 0.0–7.0)
HCT: 34 % — ABNORMAL LOW (ref 34.8–46.6)
LYMPH%: 22 % (ref 14.0–49.7)
MCHC: 33.5 g/dL (ref 31.5–36.0)
NEUT#: 4.8 10*3/uL (ref 1.5–6.5)
NEUT%: 70 % (ref 38.4–76.8)
Platelets: 246 10*3/uL (ref 145–400)
RDW: 15.7 % — ABNORMAL HIGH (ref 11.2–14.5)
WBC: 6.9 10*3/uL (ref 3.9–10.3)

## 2011-04-15 LAB — IRON AND TIBC
%SAT: 28 % (ref 20–55)
UIBC: 223 ug/dL

## 2011-04-15 LAB — COMPREHENSIVE METABOLIC PANEL
ALT: 11 U/L (ref 0–35)
AST: 14 U/L (ref 0–37)
Albumin: 4 g/dL (ref 3.5–5.2)
Calcium: 9.4 mg/dL (ref 8.4–10.5)

## 2011-07-08 ENCOUNTER — Encounter (HOSPITAL_BASED_OUTPATIENT_CLINIC_OR_DEPARTMENT_OTHER): Payer: Medicare Other | Attending: Internal Medicine

## 2011-07-08 DIAGNOSIS — I739 Peripheral vascular disease, unspecified: Secondary | ICD-10-CM | POA: Insufficient documentation

## 2011-07-08 DIAGNOSIS — I872 Venous insufficiency (chronic) (peripheral): Secondary | ICD-10-CM | POA: Insufficient documentation

## 2011-07-08 DIAGNOSIS — G589 Mononeuropathy, unspecified: Secondary | ICD-10-CM | POA: Insufficient documentation

## 2011-07-08 DIAGNOSIS — L97209 Non-pressure chronic ulcer of unspecified calf with unspecified severity: Secondary | ICD-10-CM | POA: Insufficient documentation

## 2011-07-08 DIAGNOSIS — L97809 Non-pressure chronic ulcer of other part of unspecified lower leg with unspecified severity: Secondary | ICD-10-CM | POA: Insufficient documentation

## 2011-07-08 DIAGNOSIS — Z853 Personal history of malignant neoplasm of breast: Secondary | ICD-10-CM | POA: Insufficient documentation

## 2011-07-27 ENCOUNTER — Encounter (HOSPITAL_BASED_OUTPATIENT_CLINIC_OR_DEPARTMENT_OTHER): Payer: Medicare Other | Attending: Internal Medicine

## 2011-07-27 DIAGNOSIS — L97809 Non-pressure chronic ulcer of other part of unspecified lower leg with unspecified severity: Secondary | ICD-10-CM | POA: Insufficient documentation

## 2011-07-27 DIAGNOSIS — I739 Peripheral vascular disease, unspecified: Secondary | ICD-10-CM | POA: Insufficient documentation

## 2011-07-27 DIAGNOSIS — I872 Venous insufficiency (chronic) (peripheral): Secondary | ICD-10-CM | POA: Insufficient documentation

## 2011-07-27 DIAGNOSIS — L97209 Non-pressure chronic ulcer of unspecified calf with unspecified severity: Secondary | ICD-10-CM | POA: Insufficient documentation

## 2011-07-27 DIAGNOSIS — G589 Mononeuropathy, unspecified: Secondary | ICD-10-CM | POA: Insufficient documentation

## 2011-07-27 DIAGNOSIS — Z853 Personal history of malignant neoplasm of breast: Secondary | ICD-10-CM | POA: Insufficient documentation

## 2011-07-29 ENCOUNTER — Encounter: Payer: Self-pay | Admitting: Gastroenterology

## 2011-08-17 ENCOUNTER — Ambulatory Visit (INDEPENDENT_AMBULATORY_CARE_PROVIDER_SITE_OTHER): Payer: Medicare Other | Admitting: Gastroenterology

## 2011-08-17 ENCOUNTER — Encounter: Payer: Self-pay | Admitting: Gastroenterology

## 2011-08-17 VITALS — BP 132/60 | HR 80 | Ht 66.0 in | Wt 215.0 lb

## 2011-08-17 DIAGNOSIS — R1310 Dysphagia, unspecified: Secondary | ICD-10-CM | POA: Insufficient documentation

## 2011-08-17 DIAGNOSIS — Z800 Family history of malignant neoplasm of digestive organs: Secondary | ICD-10-CM

## 2011-08-17 DIAGNOSIS — Z1211 Encounter for screening for malignant neoplasm of colon: Secondary | ICD-10-CM

## 2011-08-17 NOTE — Progress Notes (Signed)
History of Present Illness:  This is a extremely nice 77 year old African American female accompanied by her daughter. 6 weeks go she had painful swallowing which was relieved by discontinuing doxycycline which she had been given for URI. She also was placed on Nexium and when necessary Percocet. She denies chronic acid reflux symptoms, or any current swallowing difficulties. Her appetite is good and her weight is stable. She has regular bowel movements without history of melena or hematochezia, abdominal pain, or any hepatobiliary or other upper GI complaints. Review of her labs shows no evidence of anemia or evidence of iron deficiency.Both the patient's brother and sister both apparently had colon cancer, and the patient has not had previous colonoscopy or barium studies . She does suffer from essential hypertension , osteopenia, and peripheral vascular disease. She's had previous mastectomy for breast cancer some 20 years ago.  ROS: The remainder of the 10 point ROS is negative.. she does have peripheral vascular disease history treated  weekly at the wound clinic with dressings and support hose. She and the family deny problems with dementia, cardiovascular or pulmonary complaints. She does not take NSAIDs, cigarettes, or alcohol.  Past Medical History  Diagnosis Date  . Hypertension   . Breast cancer     left  . Iron deficiency   . Venous stasis     bilateral  . Anxiety   . Family history of malignant neoplasm of gastrointestinal tract    Past Surgical History  Procedure Date  . Mastectomy     left     reports that she has never smoked. She has never used smokeless tobacco. She reports that she does not drink alcohol or use illicit drugs. family history includes Anuerysm in her sister; Breast cancer in her daughter; Colon cancer in her brother and sister; and Heart disease in her brother and sister. No Known Allergies      Physical Exam: General well developed well nourished  patient in no acute distress, appearing her stated age Chest clear to  auscultation Heart no significant murmurs, gallops or rubs noted Abdomen no hepatosplenomegaly masses or tenderness,  Extremities no acute joint lesions, , phlebitis or evidence of cellulitis. Both lower extremities are wrapped with Ace bandages. Neurologic patient oriented x 3 Psychological mental status normal and normal affect.  Assessment and plan: Elderly patient without any current GI complaints. Her previous painful swallowing was almost assuredly related to pill esophagitis with doxycycline use. She denies chronic acid reflux, dysphagia, or any upper GI or lower GI complaints at this time. She does have a very strong family history of colon cancer, and I have recommended colonoscopy but she does not want to do. I have convinced her to do IFOB cards for human hemoglobin detection. Otherwise she is to continue medications as per primary care. Any swallowing problems ensue, she is to notify us immediately. In that scenario, endoscopy would be indicated.  No diagnosis found.

## 2011-08-17 NOTE — Patient Instructions (Signed)
Go to the Basement today for your iFob, make sure you return the kit to the basement within 30 days or you will be charged for it.

## 2011-08-23 ENCOUNTER — Encounter (HOSPITAL_BASED_OUTPATIENT_CLINIC_OR_DEPARTMENT_OTHER): Payer: Medicare Other | Attending: Internal Medicine

## 2011-08-23 DIAGNOSIS — B9689 Other specified bacterial agents as the cause of diseases classified elsewhere: Secondary | ICD-10-CM | POA: Insufficient documentation

## 2011-08-23 DIAGNOSIS — B965 Pseudomonas (aeruginosa) (mallei) (pseudomallei) as the cause of diseases classified elsewhere: Secondary | ICD-10-CM | POA: Insufficient documentation

## 2011-08-23 DIAGNOSIS — I872 Venous insufficiency (chronic) (peripheral): Secondary | ICD-10-CM | POA: Insufficient documentation

## 2011-08-23 DIAGNOSIS — L97809 Non-pressure chronic ulcer of other part of unspecified lower leg with unspecified severity: Secondary | ICD-10-CM | POA: Insufficient documentation

## 2011-09-16 ENCOUNTER — Other Ambulatory Visit: Payer: Self-pay | Admitting: Gastroenterology

## 2011-09-16 ENCOUNTER — Other Ambulatory Visit: Payer: Medicare Other

## 2011-09-23 ENCOUNTER — Encounter (HOSPITAL_BASED_OUTPATIENT_CLINIC_OR_DEPARTMENT_OTHER): Payer: Medicare Other | Attending: Internal Medicine

## 2011-09-23 DIAGNOSIS — L97809 Non-pressure chronic ulcer of other part of unspecified lower leg with unspecified severity: Secondary | ICD-10-CM | POA: Insufficient documentation

## 2011-09-23 DIAGNOSIS — B965 Pseudomonas (aeruginosa) (mallei) (pseudomallei) as the cause of diseases classified elsewhere: Secondary | ICD-10-CM | POA: Insufficient documentation

## 2011-09-23 DIAGNOSIS — I872 Venous insufficiency (chronic) (peripheral): Secondary | ICD-10-CM | POA: Insufficient documentation

## 2011-09-23 DIAGNOSIS — B9689 Other specified bacterial agents as the cause of diseases classified elsewhere: Secondary | ICD-10-CM | POA: Insufficient documentation

## 2011-10-07 NOTE — Progress Notes (Unsigned)
[facility]  NAME:  [name], [name] NO.:  [id number]  MEDICAL RECORD NO.:  [id number]      DATE OF BIRTH:  1925/10/01  PHYSICIAN:  Maxwell Caul, M.D.      VISIT DATE:                                  OFFICE VISIT   HISTORY:  [name] is the patient who came in today being followed for severe bilateral venous stasis ulcerations on her right medial and left lateral lower extremities.  This is a recurrence in this patient, we managed to heal her using advanced treatment options previously.  We have managed to clean the surface of the wounds with serial debridements and Santyl.  The wound bases look relatively healthy, although we have seen no major change in the dimensions of the wounds.  There are rolled edges which may require further debridement.  IMPRESSION:  Venous stasis disease.  RECOMMENDATIONS:  I am recommending that we proceed with Apligraf treatment to these areas.  These wounds prove refractory to standard wound dressings in the past at this stage.  If we do not use advanced treatment options similar to what we did last time I fear we will be looking at a very protracted sad of weekly visits to this clinic, perhaps lasting several months.          ______________________________ Maxwell Caul, M.D.     MGR/MEDQ  D:  10/07/2011  T:  10/07/2011  Job:  [id number]

## 2011-10-28 ENCOUNTER — Encounter (HOSPITAL_BASED_OUTPATIENT_CLINIC_OR_DEPARTMENT_OTHER): Payer: Medicare Other | Attending: Internal Medicine

## 2011-10-28 DIAGNOSIS — L97409 Non-pressure chronic ulcer of unspecified heel and midfoot with unspecified severity: Secondary | ICD-10-CM | POA: Insufficient documentation

## 2011-10-28 DIAGNOSIS — Z853 Personal history of malignant neoplasm of breast: Secondary | ICD-10-CM | POA: Insufficient documentation

## 2011-10-28 DIAGNOSIS — L97809 Non-pressure chronic ulcer of other part of unspecified lower leg with unspecified severity: Secondary | ICD-10-CM | POA: Insufficient documentation

## 2011-10-28 DIAGNOSIS — I872 Venous insufficiency (chronic) (peripheral): Secondary | ICD-10-CM | POA: Insufficient documentation

## 2011-10-28 DIAGNOSIS — Z79899 Other long term (current) drug therapy: Secondary | ICD-10-CM | POA: Insufficient documentation

## 2011-10-28 DIAGNOSIS — I739 Peripheral vascular disease, unspecified: Secondary | ICD-10-CM | POA: Insufficient documentation

## 2011-11-04 ENCOUNTER — Other Ambulatory Visit (HOSPITAL_COMMUNITY): Payer: Self-pay | Admitting: Internal Medicine

## 2011-11-04 ENCOUNTER — Ambulatory Visit (HOSPITAL_COMMUNITY)
Admission: RE | Admit: 2011-11-04 | Discharge: 2011-11-04 | Disposition: A | Discharge status: Home / Self Care | Payer: Medicare Other | Source: Ambulatory Visit | Attending: Internal Medicine | Admitting: Internal Medicine

## 2011-11-04 DIAGNOSIS — R059 Cough, unspecified: Secondary | ICD-10-CM

## 2011-11-04 DIAGNOSIS — R61 Generalized hyperhidrosis: Secondary | ICD-10-CM | POA: Insufficient documentation

## 2011-11-04 DIAGNOSIS — R05 Cough: Secondary | ICD-10-CM

## 2011-11-05 ENCOUNTER — Other Ambulatory Visit: Payer: Self-pay | Admitting: Oncology

## 2011-11-05 DIAGNOSIS — D0510 Intraductal carcinoma in situ of unspecified breast: Secondary | ICD-10-CM | POA: Insufficient documentation

## 2011-11-25 ENCOUNTER — Encounter (HOSPITAL_BASED_OUTPATIENT_CLINIC_OR_DEPARTMENT_OTHER): Payer: Medicare Other | Attending: Internal Medicine

## 2011-11-25 DIAGNOSIS — I872 Venous insufficiency (chronic) (peripheral): Secondary | ICD-10-CM | POA: Insufficient documentation

## 2011-11-25 DIAGNOSIS — I739 Peripheral vascular disease, unspecified: Secondary | ICD-10-CM | POA: Insufficient documentation

## 2011-11-25 DIAGNOSIS — L97409 Non-pressure chronic ulcer of unspecified heel and midfoot with unspecified severity: Secondary | ICD-10-CM | POA: Diagnosis not present

## 2011-11-25 DIAGNOSIS — L97809 Non-pressure chronic ulcer of other part of unspecified lower leg with unspecified severity: Secondary | ICD-10-CM | POA: Insufficient documentation

## 2011-11-25 DIAGNOSIS — Z79899 Other long term (current) drug therapy: Secondary | ICD-10-CM | POA: Diagnosis not present

## 2011-11-25 DIAGNOSIS — Z853 Personal history of malignant neoplasm of breast: Secondary | ICD-10-CM | POA: Insufficient documentation

## 2011-12-02 DIAGNOSIS — L97809 Non-pressure chronic ulcer of other part of unspecified lower leg with unspecified severity: Secondary | ICD-10-CM | POA: Diagnosis not present

## 2011-12-02 DIAGNOSIS — Z79899 Other long term (current) drug therapy: Secondary | ICD-10-CM | POA: Diagnosis not present

## 2011-12-02 DIAGNOSIS — L97409 Non-pressure chronic ulcer of unspecified heel and midfoot with unspecified severity: Secondary | ICD-10-CM | POA: Diagnosis not present

## 2011-12-02 DIAGNOSIS — I872 Venous insufficiency (chronic) (peripheral): Secondary | ICD-10-CM | POA: Diagnosis not present

## 2011-12-06 DIAGNOSIS — I10 Essential (primary) hypertension: Secondary | ICD-10-CM | POA: Diagnosis not present

## 2011-12-06 DIAGNOSIS — D649 Anemia, unspecified: Secondary | ICD-10-CM | POA: Diagnosis not present

## 2011-12-06 DIAGNOSIS — Z79899 Other long term (current) drug therapy: Secondary | ICD-10-CM | POA: Diagnosis not present

## 2011-12-09 DIAGNOSIS — Z79899 Other long term (current) drug therapy: Secondary | ICD-10-CM | POA: Diagnosis not present

## 2011-12-09 DIAGNOSIS — L97409 Non-pressure chronic ulcer of unspecified heel and midfoot with unspecified severity: Secondary | ICD-10-CM | POA: Diagnosis not present

## 2011-12-09 DIAGNOSIS — I872 Venous insufficiency (chronic) (peripheral): Secondary | ICD-10-CM | POA: Diagnosis not present

## 2011-12-09 DIAGNOSIS — L97809 Non-pressure chronic ulcer of other part of unspecified lower leg with unspecified severity: Secondary | ICD-10-CM | POA: Diagnosis not present

## 2011-12-16 DIAGNOSIS — L97409 Non-pressure chronic ulcer of unspecified heel and midfoot with unspecified severity: Secondary | ICD-10-CM | POA: Diagnosis not present

## 2011-12-16 DIAGNOSIS — Z79899 Other long term (current) drug therapy: Secondary | ICD-10-CM | POA: Diagnosis not present

## 2011-12-16 DIAGNOSIS — L97809 Non-pressure chronic ulcer of other part of unspecified lower leg with unspecified severity: Secondary | ICD-10-CM | POA: Diagnosis not present

## 2011-12-16 DIAGNOSIS — I872 Venous insufficiency (chronic) (peripheral): Secondary | ICD-10-CM | POA: Diagnosis not present

## 2011-12-23 DIAGNOSIS — I872 Venous insufficiency (chronic) (peripheral): Secondary | ICD-10-CM | POA: Diagnosis not present

## 2011-12-23 DIAGNOSIS — L97809 Non-pressure chronic ulcer of other part of unspecified lower leg with unspecified severity: Secondary | ICD-10-CM | POA: Diagnosis not present

## 2011-12-23 DIAGNOSIS — L97409 Non-pressure chronic ulcer of unspecified heel and midfoot with unspecified severity: Secondary | ICD-10-CM | POA: Diagnosis not present

## 2011-12-23 DIAGNOSIS — Z79899 Other long term (current) drug therapy: Secondary | ICD-10-CM | POA: Diagnosis not present

## 2011-12-30 ENCOUNTER — Encounter (HOSPITAL_BASED_OUTPATIENT_CLINIC_OR_DEPARTMENT_OTHER): Payer: Medicare Other | Attending: Internal Medicine

## 2011-12-30 DIAGNOSIS — C50919 Malignant neoplasm of unspecified site of unspecified female breast: Secondary | ICD-10-CM | POA: Insufficient documentation

## 2011-12-30 DIAGNOSIS — Z79899 Other long term (current) drug therapy: Secondary | ICD-10-CM | POA: Diagnosis not present

## 2011-12-30 DIAGNOSIS — Z923 Personal history of irradiation: Secondary | ICD-10-CM | POA: Diagnosis not present

## 2011-12-30 DIAGNOSIS — G589 Mononeuropathy, unspecified: Secondary | ICD-10-CM | POA: Insufficient documentation

## 2011-12-30 DIAGNOSIS — L97809 Non-pressure chronic ulcer of other part of unspecified lower leg with unspecified severity: Secondary | ICD-10-CM | POA: Insufficient documentation

## 2011-12-30 DIAGNOSIS — I872 Venous insufficiency (chronic) (peripheral): Secondary | ICD-10-CM | POA: Insufficient documentation

## 2012-01-06 DIAGNOSIS — Z79899 Other long term (current) drug therapy: Secondary | ICD-10-CM | POA: Diagnosis not present

## 2012-01-06 DIAGNOSIS — C50919 Malignant neoplasm of unspecified site of unspecified female breast: Secondary | ICD-10-CM | POA: Diagnosis not present

## 2012-01-06 DIAGNOSIS — L97809 Non-pressure chronic ulcer of other part of unspecified lower leg with unspecified severity: Secondary | ICD-10-CM | POA: Diagnosis not present

## 2012-01-06 DIAGNOSIS — I872 Venous insufficiency (chronic) (peripheral): Secondary | ICD-10-CM | POA: Diagnosis not present

## 2012-01-11 DIAGNOSIS — D649 Anemia, unspecified: Secondary | ICD-10-CM | POA: Diagnosis not present

## 2012-01-27 ENCOUNTER — Encounter (HOSPITAL_BASED_OUTPATIENT_CLINIC_OR_DEPARTMENT_OTHER): Payer: Medicare Other | Attending: Internal Medicine

## 2012-01-27 DIAGNOSIS — L97809 Non-pressure chronic ulcer of other part of unspecified lower leg with unspecified severity: Secondary | ICD-10-CM | POA: Diagnosis not present

## 2012-01-27 DIAGNOSIS — I872 Venous insufficiency (chronic) (peripheral): Secondary | ICD-10-CM | POA: Diagnosis not present

## 2012-01-27 DIAGNOSIS — Z79899 Other long term (current) drug therapy: Secondary | ICD-10-CM | POA: Diagnosis not present

## 2012-01-27 DIAGNOSIS — Z923 Personal history of irradiation: Secondary | ICD-10-CM | POA: Diagnosis not present

## 2012-01-27 DIAGNOSIS — C50919 Malignant neoplasm of unspecified site of unspecified female breast: Secondary | ICD-10-CM | POA: Diagnosis not present

## 2012-01-27 DIAGNOSIS — G589 Mononeuropathy, unspecified: Secondary | ICD-10-CM | POA: Diagnosis not present

## 2012-02-03 DIAGNOSIS — I872 Venous insufficiency (chronic) (peripheral): Secondary | ICD-10-CM | POA: Diagnosis not present

## 2012-02-03 DIAGNOSIS — Z79899 Other long term (current) drug therapy: Secondary | ICD-10-CM | POA: Diagnosis not present

## 2012-02-03 DIAGNOSIS — C50919 Malignant neoplasm of unspecified site of unspecified female breast: Secondary | ICD-10-CM | POA: Diagnosis not present

## 2012-02-03 DIAGNOSIS — L97809 Non-pressure chronic ulcer of other part of unspecified lower leg with unspecified severity: Secondary | ICD-10-CM | POA: Diagnosis not present

## 2012-02-09 DIAGNOSIS — I83219 Varicose veins of right lower extremity with both ulcer of unspecified site and inflammation: Secondary | ICD-10-CM | POA: Diagnosis not present

## 2012-02-10 DIAGNOSIS — Z79899 Other long term (current) drug therapy: Secondary | ICD-10-CM | POA: Diagnosis not present

## 2012-02-10 DIAGNOSIS — L97809 Non-pressure chronic ulcer of other part of unspecified lower leg with unspecified severity: Secondary | ICD-10-CM | POA: Diagnosis not present

## 2012-02-10 DIAGNOSIS — I872 Venous insufficiency (chronic) (peripheral): Secondary | ICD-10-CM | POA: Diagnosis not present

## 2012-02-10 DIAGNOSIS — C50919 Malignant neoplasm of unspecified site of unspecified female breast: Secondary | ICD-10-CM | POA: Diagnosis not present

## 2012-02-15 DIAGNOSIS — I83219 Varicose veins of right lower extremity with both ulcer of unspecified site and inflammation: Secondary | ICD-10-CM | POA: Diagnosis not present

## 2012-02-15 DIAGNOSIS — L97929 Non-pressure chronic ulcer of unspecified part of left lower leg with unspecified severity: Secondary | ICD-10-CM | POA: Diagnosis not present

## 2012-02-15 DIAGNOSIS — I83229 Varicose veins of left lower extremity with both ulcer of unspecified site and inflammation: Secondary | ICD-10-CM | POA: Diagnosis not present

## 2012-02-15 DIAGNOSIS — L97919 Non-pressure chronic ulcer of unspecified part of right lower leg with unspecified severity: Secondary | ICD-10-CM | POA: Diagnosis not present

## 2012-03-15 DIAGNOSIS — I824Z9 Acute embolism and thrombosis of unspecified deep veins of unspecified distal lower extremity: Secondary | ICD-10-CM | POA: Diagnosis not present

## 2012-04-14 ENCOUNTER — Other Ambulatory Visit: Payer: Self-pay | Admitting: Medical Oncology

## 2012-04-14 ENCOUNTER — Ambulatory Visit (HOSPITAL_BASED_OUTPATIENT_CLINIC_OR_DEPARTMENT_OTHER): Payer: Medicare Other | Admitting: Oncology

## 2012-04-14 ENCOUNTER — Ambulatory Visit (HOSPITAL_BASED_OUTPATIENT_CLINIC_OR_DEPARTMENT_OTHER): Payer: Medicare Other | Admitting: Lab

## 2012-04-14 ENCOUNTER — Encounter: Payer: Self-pay | Admitting: Oncology

## 2012-04-14 VITALS — BP 135/69 | HR 76 | Temp 96.9°F | Ht 66.0 in | Wt 203.9 lb

## 2012-04-14 DIAGNOSIS — Z86718 Personal history of other venous thrombosis and embolism: Secondary | ICD-10-CM

## 2012-04-14 DIAGNOSIS — D0590 Unspecified type of carcinoma in situ of unspecified breast: Secondary | ICD-10-CM | POA: Diagnosis not present

## 2012-04-14 DIAGNOSIS — Z853 Personal history of malignant neoplasm of breast: Secondary | ICD-10-CM | POA: Diagnosis not present

## 2012-04-14 DIAGNOSIS — D0510 Intraductal carcinoma in situ of unspecified breast: Secondary | ICD-10-CM

## 2012-04-14 DIAGNOSIS — D0511 Intraductal carcinoma in situ of right breast: Secondary | ICD-10-CM

## 2012-04-14 LAB — COMPREHENSIVE METABOLIC PANEL
AST: 13 U/L (ref 0–37)
Albumin: 4.1 g/dL (ref 3.5–5.2)
Alkaline Phosphatase: 62 U/L (ref 39–117)
Potassium: 4.6 mEq/L (ref 3.5–5.3)
Sodium: 141 mEq/L (ref 135–145)
Total Bilirubin: 0.6 mg/dL (ref 0.3–1.2)
Total Protein: 6.7 g/dL (ref 6.0–8.3)

## 2012-04-14 LAB — CBC WITH DIFFERENTIAL/PLATELET
BASO%: 0.4 % (ref 0.0–2.0)
EOS%: 1.1 % (ref 0.0–7.0)
MCH: 29.4 pg (ref 25.1–34.0)
MCHC: 33.1 g/dL (ref 31.5–36.0)
MCV: 88.8 fL (ref 79.5–101.0)
MONO%: 6.9 % (ref 0.0–14.0)
RBC: 4 10*6/uL (ref 3.70–5.45)
RDW: 15.8 % — ABNORMAL HIGH (ref 11.2–14.5)
lymph#: 1.7 10*3/uL (ref 0.9–3.3)

## 2012-04-14 NOTE — Progress Notes (Signed)
CC:   Billie Lade, Ph.D., M.D. Angelia Mould. Derrell Lolling, M.D. Gretel Acre, MD  PROBLEM LIST: 1. Ductal carcinoma in situ involving the right breast with strongly     positive hormone receptors status post excision on 01/26/2006 with     re-excision on 02/04/2006.  Tumor was low to intermediate grade.     Tumor was located in the lower inner quadrant.  Estrogen receptor     was 97%, progesterone receptor 97%.  The patient received radiation     treatments to the right breast under the direction of Dr. Antony Blackbird from 03/08/2006 through 04/21/2006.  She received a total of     6400 cGy. 2. Invasive cancer of the left breast diagnosed in 1990 for which the     patient underwent a left-sided mastectomy and was on tamoxifen for     approximately 17 years.  Tamoxifen was discontinued in November     2007. 3. History of DVT involving the right leg in 1990 and again in May     2007. 4. Anemia. 5. Hypertension. 6. Systolic ejection murmur. 7. History of ulcers involving both legs felt to be venous ulcers. 8. Positive family history for breast cancer in the patient's sister     and daughter, Ryian Lynde.  Genetic testing was carried out in     December 2011.  No mutations were detected.  MEDICATIONS: 1. Xanax 0.5 mg as needed. 2. Moduretic 5-550 one tablet daily. 3. Cholecalciferol 1000 units daily. 4. Ferrous sulfate 325 mg daily. 5. Vicodin 5-500 one as needed every 6 hours for pain. 6. Klor-Con 10 mEq 1-1/2 tablets daily. 7. Multivitamins 1 daily. 8. Omega-3 fatty acids 1000 mg 1 capsule daily.  HISTORY:  [name] was seen for followup of her DCIS involving the right breast and her previous history of left-sided breast cancer. Ms.  Fundora is accompanied by her daughter, [name], who has been treated for breast cancer without recurrence.  The patient was last seen by [place] on 04/15/2011 and prior to that on 04/14/2010.  The patient has done well.  She denies any  interval problems with her health.  She apparently lives with 1 of her other daughters.  She is without any complaints today.  She is on no medicines for cancer and has remained disease free.  PHYSICAL EXAMINATION:  The patient looks well.  She is 78 years old, soon to be 78 years old in early September.  Weight is 203.9 pounds, down from 218.5 pounds.  The patient has not been trying to lose weight but denies any GI problems or anorexia.  Height is 5 feet 6 inches, body surface area 2.08 meters squared.  Blood pressure 135/69.  Other vital signs are normal.  There is no scleral icterus.  Mouth and pharynx are benign.  No peripheral adenopathy palpable.  Heart and lungs were normal.  Left breast was surgically absent with no evidence for chest wall recurrence.  Right breast has a well-healed incision at about the 3 or 4 o'clock position.  The breast was soft with minimal radiation changes.  There were no suspicious findings.  There was some breast distortion.  No axillary adenopathy.  Abdomen was benign but obese with no organomegaly, masses palpable.  Extremities, no peripheral edema.  No obvious lymphedema of the left upper extremity.  LABORATORY DATA:  Today, consisting of CBC, chemistries and LDH are pending.  On 04/15/2011 CBC was notable for a white count of 6.9.  ANC 4.8, hemoglobin 11.4, hematocrit 34.0, platelets 246,000.  Chemistries were normal.  BUN 25, creatinine 1.06.  Ferritin was 116 with iron saturation 28%.  IMAGING STUDIES: 1. Digital diagnostic right mammogram on 03/15/2011 was negative for     any suspicious findings. 2. Chest x-ray, 2 view, from 11/04/2011 showed stable cardiopulmonary     appearance with no worrisome abnormalities.  IMPRESSION AND PLAN:  Ms. Auble is now out over 6 years from the time of diagnosis of her ductal carcinoma in situ involving the right breast. She is now out about 23 years from the time of her cancer involving the left breast.   The patient is disease free.  She seems to be doing well. I gave her the option of returning in 1 year.  The patient and her daughter would prefer to see [place] on an as needed basis.  As stated, the patient will be having labs today after her appointment.  We will be happy to see the patient again should any questions or concerns arise in the future.    ______________________________ Samul Dada, M.D. DSM/MEDQ  D:  04/14/2012  T:  04/14/2012  Job:  [id number]

## 2012-04-14 NOTE — Progress Notes (Signed)
This office note has been dictated.  #[id number]

## 2012-05-02 DIAGNOSIS — D649 Anemia, unspecified: Secondary | ICD-10-CM | POA: Diagnosis not present

## 2012-05-02 DIAGNOSIS — G479 Sleep disorder, unspecified: Secondary | ICD-10-CM | POA: Diagnosis not present

## 2012-05-02 DIAGNOSIS — I10 Essential (primary) hypertension: Secondary | ICD-10-CM | POA: Diagnosis not present

## 2012-08-02 ENCOUNTER — Other Ambulatory Visit: Payer: Self-pay | Admitting: Family Medicine

## 2012-08-02 DIAGNOSIS — Z853 Personal history of malignant neoplasm of breast: Secondary | ICD-10-CM

## 2012-08-02 DIAGNOSIS — Z0000 Encounter for general adult medical examination without abnormal findings: Secondary | ICD-10-CM | POA: Diagnosis not present

## 2012-08-02 DIAGNOSIS — Z9010 Acquired absence of unspecified breast and nipple: Secondary | ICD-10-CM

## 2012-08-02 DIAGNOSIS — Z780 Asymptomatic menopausal state: Secondary | ICD-10-CM

## 2012-08-23 ENCOUNTER — Ambulatory Visit
Admission: RE | Admit: 2012-08-23 | Discharge: 2012-08-23 | Disposition: A | Discharge status: Home / Self Care | Payer: Medicare Other | Source: Ambulatory Visit | Attending: Family Medicine | Admitting: Family Medicine

## 2012-08-23 DIAGNOSIS — Z853 Personal history of malignant neoplasm of breast: Secondary | ICD-10-CM

## 2012-08-23 DIAGNOSIS — Z780 Asymptomatic menopausal state: Secondary | ICD-10-CM | POA: Diagnosis not present

## 2012-08-23 DIAGNOSIS — R928 Other abnormal and inconclusive findings on diagnostic imaging of breast: Secondary | ICD-10-CM | POA: Diagnosis not present

## 2012-08-23 DIAGNOSIS — Z9010 Acquired absence of unspecified breast and nipple: Secondary | ICD-10-CM

## 2012-11-08 DIAGNOSIS — M79609 Pain in unspecified limb: Secondary | ICD-10-CM | POA: Diagnosis not present

## 2012-11-08 DIAGNOSIS — R42 Dizziness and giddiness: Secondary | ICD-10-CM | POA: Diagnosis not present

## 2012-11-22 ENCOUNTER — Emergency Department (HOSPITAL_COMMUNITY): Payer: Medicare Other

## 2012-11-22 ENCOUNTER — Emergency Department (HOSPITAL_COMMUNITY)
Admission: EM | Admit: 2012-11-22 | Discharge: 2012-11-22 | Disposition: A | Discharge status: Home / Self Care | Payer: Medicare Other | Attending: Emergency Medicine | Admitting: Emergency Medicine

## 2012-11-22 DIAGNOSIS — Z8679 Personal history of other diseases of the circulatory system: Secondary | ICD-10-CM | POA: Diagnosis not present

## 2012-11-22 DIAGNOSIS — Z79899 Other long term (current) drug therapy: Secondary | ICD-10-CM | POA: Diagnosis not present

## 2012-11-22 DIAGNOSIS — F411 Generalized anxiety disorder: Secondary | ICD-10-CM | POA: Insufficient documentation

## 2012-11-22 DIAGNOSIS — K5730 Diverticulosis of large intestine without perforation or abscess without bleeding: Secondary | ICD-10-CM | POA: Diagnosis not present

## 2012-11-22 DIAGNOSIS — I10 Essential (primary) hypertension: Secondary | ICD-10-CM | POA: Diagnosis not present

## 2012-11-22 DIAGNOSIS — N281 Cyst of kidney, acquired: Secondary | ICD-10-CM | POA: Diagnosis not present

## 2012-11-22 DIAGNOSIS — Z853 Personal history of malignant neoplasm of breast: Secondary | ICD-10-CM | POA: Insufficient documentation

## 2012-11-22 DIAGNOSIS — K5732 Diverticulitis of large intestine without perforation or abscess without bleeding: Secondary | ICD-10-CM | POA: Diagnosis not present

## 2012-11-22 DIAGNOSIS — K8020 Calculus of gallbladder without cholecystitis without obstruction: Secondary | ICD-10-CM | POA: Diagnosis not present

## 2012-11-22 DIAGNOSIS — K5790 Diverticulosis of intestine, part unspecified, without perforation or abscess without bleeding: Secondary | ICD-10-CM

## 2012-11-22 DIAGNOSIS — D649 Anemia, unspecified: Secondary | ICD-10-CM | POA: Diagnosis not present

## 2012-11-22 LAB — COMPREHENSIVE METABOLIC PANEL
BUN: 31 mg/dL — ABNORMAL HIGH (ref 6–23)
CO2: 26 mEq/L (ref 19–32)
Chloride: 99 mEq/L (ref 96–112)
Creatinine, Ser: 1.12 mg/dL — ABNORMAL HIGH (ref 0.50–1.10)
GFR calc Af Amer: 50 mL/min — ABNORMAL LOW (ref >90)
GFR calc non Af Amer: 44 mL/min — ABNORMAL LOW (ref >90)
Glucose, Bld: 105 mg/dL — ABNORMAL HIGH (ref 70–99)
Total Bilirubin: 0.3 mg/dL (ref 0.3–1.2)

## 2012-11-22 LAB — CBC
MCH: 28.9 pg (ref 26.0–34.0)
MCHC: 34 g/dL (ref 30.0–36.0)
Platelets: 304 10*3/uL (ref 150–400)

## 2012-11-22 LAB — TYPE AND SCREEN
ABO/RH(D): A POS
Antibody Screen: NEGATIVE

## 2012-11-22 MED ORDER — IOHEXOL 300 MG/ML  SOLN
80.0000 mL | Freq: Once | INTRAMUSCULAR | Status: AC | PRN
  Administered 2012-11-22: 80 mL via INTRAVENOUS

## 2012-11-22 NOTE — ED Notes (Signed)
Pt complaining of dizziness when standing to go to restroom

## 2012-11-22 NOTE — ED Notes (Signed)
Pt c/o dark tarry bloody stool that started today at noon. Pt states bleeding has continued. Pt c/o lower abd pain. Pt states "It just feels like my bowels want to move." Pt denies being on blood thinners, but states she takes iron pills. Pt BIB family.

## 2012-11-22 NOTE — ED Provider Notes (Signed)
History     CSN: [id number]  Arrival date & time 11/22/12  1448   First MD Initiated Contact with Patient 11/22/12 1627      Chief Complaint  Patient presents with  . Rectal Bleeding    (Consider location/radiation/quality/duration/timing/severity/associated sxs/prior treatment) HPI  The patient presents with concerns of GI bleed.  She notes that she was in her usual state of health until approximately 3 hours ago, when after an episode of tenesmus, she subsequently had production of rectal bleeding.  There is no accompanying stool.  Since that time she has had persistent rectal bleeding.  There is associated right-sided lower abdominal pain, there is mild, sore.  No clear precipitant.  Since onset have been no clear alleviating or exacerbating factors. She does endorse mild lightheadedness, but denies syncope, chest pain, dyspnea, nausea, vomiting. She has a distant history of malignancy, though no GI malignancy. No history of any colonoscopy thus far.  Past Medical History  Diagnosis Date  . Hypertension   . Breast cancer     left  . Iron deficiency   . Venous stasis     bilateral  . Anxiety   . Family history of malignant neoplasm of gastrointestinal tract     Past Surgical History  Procedure Date  . Mastectomy     left     Family History  Problem Relation Age of Onset  . Colon cancer Brother   . Heart disease Brother   . Breast cancer Daughter   . Colon cancer Sister   . Heart disease Sister     MI  . Anuerysm Sister     History  Substance Use Topics  . Smoking status: Never Smoker   . Smokeless tobacco: Never Used  . Alcohol Use: No    OB History    Grav Para Term Preterm Abortions TAB SAB Ect Mult Living                  Review of Systems  Constitutional:       Per HPI, otherwise negative  HENT:       Per HPI, otherwise negative  Eyes: Negative.   Respiratory:       Per HPI, otherwise negative  Cardiovascular:       Per HPI, otherwise  negative  Gastrointestinal: Negative for vomiting.  Genitourinary: Negative.   Musculoskeletal:       Per HPI, otherwise negative  Skin: Negative.   Neurological: Negative for syncope.    Allergies  Review of patient's allergies indicates no known allergies.  Home Medications   Current Outpatient Rx  Name  Route  Sig  Dispense  Refill  . ALPRAZOLAM 0.5 MG PO TABS      As needed         . AMILORIDE-HYDROCHLOROTHIAZIDE 5-50 MG PO TABS      One tablet by mouth once daily          . VITAMIN D3 1000 UNITS PO CAPS   Oral   Take 1 capsule by mouth daily.           [name] FERROUS SULFATE 325 (65 FE) MG PO TABS   Oral   Take 325 mg by mouth daily with breakfast.         . HYDROCODONE-ACETAMINOPHEN 5-500 MG PO TABS      One tablet by mouth every six hours as needed for pain          . KLOR-CON M10 10 MEQ PO TBCR  One and 1/2 tablets by mouth once daily          . CENTRUM SILVER PO   Oral   Take 1 tablet by mouth daily.           [name] FISH OIL 1000 MG PO CAPS   Oral   Take 1 capsule by mouth daily.             BP 127/61  Pulse 91  Temp 97.7 F (36.5 C) (Oral)  Resp 16  SpO2 98%  Physical Exam  Nursing note and vitals reviewed. Constitutional: She is oriented to person, place, and time. She appears well-developed and well-nourished. No distress.  HENT:  Head: Normocephalic and atraumatic.  Eyes: Conjunctivae normal and EOM are normal.  Cardiovascular: Normal rate and regular rhythm.   Pulmonary/Chest: Effort normal and breath sounds normal. No stridor. No respiratory distress.  Abdominal: She exhibits no distension.  Genitourinary: Rectal exam shows no external hemorrhoid, no internal hemorrhoid, no fissure, no mass, no tenderness and anal tone normal. Guaiac positive stool.       Grossly bloody rectal exam, Hemoccult positive  Musculoskeletal: She exhibits no edema.  Neurological: She is alert and oriented to person, place, and time. No cranial  nerve deficit.  Skin: Skin is warm and dry.  Psychiatric: She has a normal mood and affect.    ED Course  Procedures (including critical care time)  Labs Reviewed  CBC - Abnormal; Notable for the following:    WBC 11.9 (*)     All other components within normal limits  COMPREHENSIVE METABOLIC PANEL - Abnormal; Notable for the following:    Glucose, Bld 105 (*)     BUN 31 (*)     Creatinine, Ser 1.12 (*)     GFR calc non Af Amer 44 (*)     GFR calc Af Amer 50 (*)     All other components within normal limits  OCCULT BLOOD, POC DEVICE - Abnormal; Notable for the following:    Fecal Occult Bld POSITIVE (*)     All other components within normal limits  TYPE AND SCREEN   No results found.   No diagnosis found.  Pulse ox 99% room air normal   MDM  This elderly female presents with essentially painless rectal bleeding.  On exam she is in no distress.  The patient is hemodynamically stable.  Given her absence of prior colonoscopy, dyspnea rectal bleeding, there suspicion for malignancy given the minimal lower abdominal pain.  CT scan was consistent with diverticulosis, though there was some evidence of fluid within the endometrium.  Absent diverticulitis, distress, fever, antibiotics are not indicated.  The patient was made aware of the endometrial findings, the necessity for outpatient ultrasound, and discharged in stable condition for PMD followup expeditiously.  Gerhard Munch, MD 11/22/12 2016

## 2012-11-23 ENCOUNTER — Other Ambulatory Visit: Payer: Self-pay | Admitting: Family Medicine

## 2012-11-23 DIAGNOSIS — N949 Unspecified condition associated with female genital organs and menstrual cycle: Secondary | ICD-10-CM | POA: Diagnosis not present

## 2012-11-23 DIAGNOSIS — K8020 Calculus of gallbladder without cholecystitis without obstruction: Secondary | ICD-10-CM | POA: Diagnosis not present

## 2012-11-23 DIAGNOSIS — R102 Pelvic and perineal pain: Secondary | ICD-10-CM

## 2012-11-23 DIAGNOSIS — K625 Hemorrhage of anus and rectum: Secondary | ICD-10-CM | POA: Diagnosis not present

## 2012-11-27 ENCOUNTER — Ambulatory Visit
Admission: RE | Admit: 2012-11-27 | Discharge: 2012-11-27 | Disposition: A | Discharge status: Home / Self Care | Payer: Medicare Other | Source: Ambulatory Visit | Attending: Family Medicine | Admitting: Family Medicine

## 2012-11-27 DIAGNOSIS — R102 Pelvic and perineal pain: Secondary | ICD-10-CM

## 2012-11-27 DIAGNOSIS — N859 Noninflammatory disorder of uterus, unspecified: Secondary | ICD-10-CM | POA: Diagnosis not present

## 2012-11-27 DIAGNOSIS — K921 Melena: Secondary | ICD-10-CM | POA: Diagnosis not present

## 2012-11-29 ENCOUNTER — Other Ambulatory Visit: Payer: Self-pay | Admitting: Gastroenterology

## 2012-11-29 DIAGNOSIS — K921 Melena: Secondary | ICD-10-CM

## 2012-12-07 ENCOUNTER — Ambulatory Visit
Admission: RE | Admit: 2012-12-07 | Discharge: 2012-12-07 | Disposition: A | Discharge status: Home / Self Care | Payer: Medicare Other | Source: Ambulatory Visit | Attending: Gastroenterology | Admitting: Gastroenterology

## 2012-12-07 ENCOUNTER — Other Ambulatory Visit: Payer: Self-pay | Admitting: Gastroenterology

## 2012-12-07 DIAGNOSIS — K921 Melena: Secondary | ICD-10-CM

## 2012-12-07 DIAGNOSIS — K5730 Diverticulosis of large intestine without perforation or abscess without bleeding: Secondary | ICD-10-CM | POA: Diagnosis not present

## 2012-12-13 ENCOUNTER — Other Ambulatory Visit: Payer: Self-pay | Admitting: Obstetrics and Gynecology

## 2012-12-13 ENCOUNTER — Other Ambulatory Visit (HOSPITAL_COMMUNITY)
Admission: RE | Admit: 2012-12-13 | Discharge: 2012-12-13 | Disposition: A | Discharge status: Home / Self Care | Payer: Medicare Other | Source: Ambulatory Visit | Attending: Obstetrics and Gynecology | Admitting: Obstetrics and Gynecology

## 2012-12-13 DIAGNOSIS — Z1151 Encounter for screening for human papillomavirus (HPV): Secondary | ICD-10-CM | POA: Insufficient documentation

## 2012-12-13 DIAGNOSIS — K625 Hemorrhage of anus and rectum: Secondary | ICD-10-CM | POA: Diagnosis not present

## 2012-12-13 DIAGNOSIS — Z01419 Encounter for gynecological examination (general) (routine) without abnormal findings: Secondary | ICD-10-CM | POA: Diagnosis not present

## 2012-12-13 DIAGNOSIS — R9389 Abnormal findings on diagnostic imaging of other specified body structures: Secondary | ICD-10-CM | POA: Diagnosis not present

## 2012-12-25 ENCOUNTER — Other Ambulatory Visit: Payer: Self-pay | Admitting: Obstetrics and Gynecology

## 2012-12-25 DIAGNOSIS — C549 Malignant neoplasm of corpus uteri, unspecified: Secondary | ICD-10-CM | POA: Diagnosis not present

## 2012-12-25 DIAGNOSIS — R9389 Abnormal findings on diagnostic imaging of other specified body structures: Secondary | ICD-10-CM | POA: Diagnosis not present

## 2012-12-25 DIAGNOSIS — C541 Malignant neoplasm of endometrium: Secondary | ICD-10-CM

## 2012-12-25 HISTORY — DX: Malignant neoplasm of endometrium: C54.1

## 2012-12-29 DIAGNOSIS — C549 Malignant neoplasm of corpus uteri, unspecified: Secondary | ICD-10-CM | POA: Diagnosis not present

## 2013-01-01 DIAGNOSIS — N950 Postmenopausal bleeding: Secondary | ICD-10-CM | POA: Diagnosis not present

## 2013-01-01 DIAGNOSIS — Z9010 Acquired absence of unspecified breast and nipple: Secondary | ICD-10-CM | POA: Diagnosis not present

## 2013-01-01 DIAGNOSIS — Z9223 Personal history of estrogen therapy: Secondary | ICD-10-CM | POA: Diagnosis not present

## 2013-01-01 DIAGNOSIS — Z8042 Family history of malignant neoplasm of prostate: Secondary | ICD-10-CM | POA: Diagnosis not present

## 2013-01-01 DIAGNOSIS — Z853 Personal history of malignant neoplasm of breast: Secondary | ICD-10-CM | POA: Diagnosis not present

## 2013-01-01 DIAGNOSIS — I83893 Varicose veins of bilateral lower extremities with other complications: Secondary | ICD-10-CM | POA: Diagnosis not present

## 2013-01-01 DIAGNOSIS — Z803 Family history of malignant neoplasm of breast: Secondary | ICD-10-CM | POA: Diagnosis not present

## 2013-01-01 DIAGNOSIS — Z800 Family history of malignant neoplasm of digestive organs: Secondary | ICD-10-CM | POA: Diagnosis not present

## 2013-01-01 DIAGNOSIS — M545 Low back pain, unspecified: Secondary | ICD-10-CM | POA: Diagnosis not present

## 2013-01-01 DIAGNOSIS — Z791 Long term (current) use of non-steroidal anti-inflammatories (NSAID): Secondary | ICD-10-CM | POA: Diagnosis not present

## 2013-01-01 DIAGNOSIS — Z7901 Long term (current) use of anticoagulants: Secondary | ICD-10-CM | POA: Diagnosis not present

## 2013-01-01 DIAGNOSIS — Z923 Personal history of irradiation: Secondary | ICD-10-CM | POA: Diagnosis not present

## 2013-01-01 DIAGNOSIS — D509 Iron deficiency anemia, unspecified: Secondary | ICD-10-CM | POA: Diagnosis not present

## 2013-01-01 DIAGNOSIS — C549 Malignant neoplasm of corpus uteri, unspecified: Secondary | ICD-10-CM | POA: Diagnosis not present

## 2013-01-01 DIAGNOSIS — Z86718 Personal history of other venous thrombosis and embolism: Secondary | ICD-10-CM | POA: Diagnosis not present

## 2013-01-01 DIAGNOSIS — Z87898 Personal history of other specified conditions: Secondary | ICD-10-CM | POA: Diagnosis not present

## 2013-01-01 DIAGNOSIS — N949 Unspecified condition associated with female genital organs and menstrual cycle: Secondary | ICD-10-CM | POA: Diagnosis not present

## 2013-01-01 DIAGNOSIS — I10 Essential (primary) hypertension: Secondary | ICD-10-CM | POA: Diagnosis not present

## 2013-01-01 DIAGNOSIS — Z9181 History of falling: Secondary | ICD-10-CM | POA: Diagnosis not present

## 2013-01-03 DIAGNOSIS — C549 Malignant neoplasm of corpus uteri, unspecified: Secondary | ICD-10-CM | POA: Diagnosis not present

## 2013-01-11 DIAGNOSIS — N800 Endometriosis of uterus: Secondary | ICD-10-CM | POA: Diagnosis not present

## 2013-01-11 DIAGNOSIS — I517 Cardiomegaly: Secondary | ICD-10-CM | POA: Diagnosis not present

## 2013-01-11 DIAGNOSIS — Z86718 Personal history of other venous thrombosis and embolism: Secondary | ICD-10-CM | POA: Diagnosis not present

## 2013-01-11 DIAGNOSIS — J449 Chronic obstructive pulmonary disease, unspecified: Secondary | ICD-10-CM | POA: Diagnosis not present

## 2013-01-11 DIAGNOSIS — R011 Cardiac murmur, unspecified: Secondary | ICD-10-CM | POA: Diagnosis not present

## 2013-01-11 DIAGNOSIS — Z7982 Long term (current) use of aspirin: Secondary | ICD-10-CM | POA: Diagnosis not present

## 2013-01-11 DIAGNOSIS — C549 Malignant neoplasm of corpus uteri, unspecified: Secondary | ICD-10-CM | POA: Diagnosis not present

## 2013-01-11 DIAGNOSIS — I739 Peripheral vascular disease, unspecified: Secondary | ICD-10-CM | POA: Diagnosis not present

## 2013-01-11 DIAGNOSIS — D509 Iron deficiency anemia, unspecified: Secondary | ICD-10-CM | POA: Diagnosis not present

## 2013-01-11 DIAGNOSIS — Z853 Personal history of malignant neoplasm of breast: Secondary | ICD-10-CM | POA: Diagnosis not present

## 2013-01-11 DIAGNOSIS — L02419 Cutaneous abscess of limb, unspecified: Secondary | ICD-10-CM | POA: Diagnosis not present

## 2013-01-11 DIAGNOSIS — I10 Essential (primary) hypertension: Secondary | ICD-10-CM | POA: Diagnosis not present

## 2013-01-11 DIAGNOSIS — K449 Diaphragmatic hernia without obstruction or gangrene: Secondary | ICD-10-CM | POA: Diagnosis not present

## 2013-01-11 DIAGNOSIS — Z01818 Encounter for other preprocedural examination: Secondary | ICD-10-CM | POA: Diagnosis not present

## 2013-01-11 DIAGNOSIS — K429 Umbilical hernia without obstruction or gangrene: Secondary | ICD-10-CM | POA: Diagnosis not present

## 2013-01-12 DIAGNOSIS — N800 Endometriosis of uterus: Secondary | ICD-10-CM | POA: Diagnosis not present

## 2013-01-12 DIAGNOSIS — R011 Cardiac murmur, unspecified: Secondary | ICD-10-CM | POA: Diagnosis not present

## 2013-01-12 DIAGNOSIS — C549 Malignant neoplasm of corpus uteri, unspecified: Secondary | ICD-10-CM | POA: Diagnosis not present

## 2013-01-12 DIAGNOSIS — I739 Peripheral vascular disease, unspecified: Secondary | ICD-10-CM | POA: Diagnosis not present

## 2013-01-12 DIAGNOSIS — K5730 Diverticulosis of large intestine without perforation or abscess without bleeding: Secondary | ICD-10-CM | POA: Diagnosis not present

## 2013-01-12 DIAGNOSIS — K449 Diaphragmatic hernia without obstruction or gangrene: Secondary | ICD-10-CM | POA: Diagnosis not present

## 2013-01-12 DIAGNOSIS — I10 Essential (primary) hypertension: Secondary | ICD-10-CM | POA: Diagnosis not present

## 2013-01-12 DIAGNOSIS — M129 Arthropathy, unspecified: Secondary | ICD-10-CM | POA: Diagnosis not present

## 2013-01-18 DIAGNOSIS — Z90710 Acquired absence of both cervix and uterus: Secondary | ICD-10-CM | POA: Diagnosis not present

## 2013-01-18 DIAGNOSIS — K449 Diaphragmatic hernia without obstruction or gangrene: Secondary | ICD-10-CM | POA: Diagnosis not present

## 2013-01-18 DIAGNOSIS — I10 Essential (primary) hypertension: Secondary | ICD-10-CM | POA: Diagnosis not present

## 2013-01-18 DIAGNOSIS — N800 Endometriosis of uterus: Secondary | ICD-10-CM | POA: Diagnosis not present

## 2013-01-18 DIAGNOSIS — Z9079 Acquired absence of other genital organ(s): Secondary | ICD-10-CM | POA: Diagnosis not present

## 2013-01-18 DIAGNOSIS — Z483 Aftercare following surgery for neoplasm: Secondary | ICD-10-CM | POA: Diagnosis not present

## 2013-01-18 DIAGNOSIS — R011 Cardiac murmur, unspecified: Secondary | ICD-10-CM | POA: Diagnosis not present

## 2013-01-18 DIAGNOSIS — C549 Malignant neoplasm of corpus uteri, unspecified: Secondary | ICD-10-CM | POA: Diagnosis not present

## 2013-01-18 DIAGNOSIS — I739 Peripheral vascular disease, unspecified: Secondary | ICD-10-CM | POA: Diagnosis not present

## 2013-01-18 HISTORY — PX: ABDOMINAL HYSTERECTOMY: SHX81

## 2013-01-19 DIAGNOSIS — C549 Malignant neoplasm of corpus uteri, unspecified: Secondary | ICD-10-CM | POA: Diagnosis not present

## 2013-01-19 DIAGNOSIS — R011 Cardiac murmur, unspecified: Secondary | ICD-10-CM | POA: Diagnosis not present

## 2013-01-19 DIAGNOSIS — K449 Diaphragmatic hernia without obstruction or gangrene: Secondary | ICD-10-CM | POA: Diagnosis not present

## 2013-01-19 DIAGNOSIS — I739 Peripheral vascular disease, unspecified: Secondary | ICD-10-CM | POA: Diagnosis not present

## 2013-01-19 DIAGNOSIS — N800 Endometriosis of uterus: Secondary | ICD-10-CM | POA: Diagnosis not present

## 2013-01-19 DIAGNOSIS — I10 Essential (primary) hypertension: Secondary | ICD-10-CM | POA: Diagnosis not present

## 2013-01-20 DIAGNOSIS — I10 Essential (primary) hypertension: Secondary | ICD-10-CM | POA: Diagnosis not present

## 2013-01-20 DIAGNOSIS — I739 Peripheral vascular disease, unspecified: Secondary | ICD-10-CM | POA: Diagnosis not present

## 2013-01-20 DIAGNOSIS — C549 Malignant neoplasm of corpus uteri, unspecified: Secondary | ICD-10-CM | POA: Diagnosis not present

## 2013-01-20 DIAGNOSIS — R011 Cardiac murmur, unspecified: Secondary | ICD-10-CM | POA: Diagnosis not present

## 2013-01-20 DIAGNOSIS — N800 Endometriosis of uterus: Secondary | ICD-10-CM | POA: Diagnosis not present

## 2013-01-20 DIAGNOSIS — K449 Diaphragmatic hernia without obstruction or gangrene: Secondary | ICD-10-CM | POA: Diagnosis not present

## 2013-01-29 DIAGNOSIS — L02419 Cutaneous abscess of limb, unspecified: Secondary | ICD-10-CM | POA: Diagnosis not present

## 2013-01-29 DIAGNOSIS — K449 Diaphragmatic hernia without obstruction or gangrene: Secondary | ICD-10-CM | POA: Diagnosis not present

## 2013-01-29 DIAGNOSIS — Z86718 Personal history of other venous thrombosis and embolism: Secondary | ICD-10-CM | POA: Diagnosis not present

## 2013-01-29 DIAGNOSIS — N800 Endometriosis of uterus: Secondary | ICD-10-CM | POA: Diagnosis not present

## 2013-01-29 DIAGNOSIS — D509 Iron deficiency anemia, unspecified: Secondary | ICD-10-CM | POA: Diagnosis not present

## 2013-01-29 DIAGNOSIS — K429 Umbilical hernia without obstruction or gangrene: Secondary | ICD-10-CM | POA: Diagnosis not present

## 2013-01-29 DIAGNOSIS — I517 Cardiomegaly: Secondary | ICD-10-CM | POA: Diagnosis not present

## 2013-01-29 DIAGNOSIS — I10 Essential (primary) hypertension: Secondary | ICD-10-CM | POA: Diagnosis not present

## 2013-01-29 DIAGNOSIS — I739 Peripheral vascular disease, unspecified: Secondary | ICD-10-CM | POA: Diagnosis not present

## 2013-01-29 DIAGNOSIS — Z853 Personal history of malignant neoplasm of breast: Secondary | ICD-10-CM | POA: Diagnosis not present

## 2013-01-29 DIAGNOSIS — K5730 Diverticulosis of large intestine without perforation or abscess without bleeding: Secondary | ICD-10-CM | POA: Diagnosis not present

## 2013-01-29 DIAGNOSIS — C549 Malignant neoplasm of corpus uteri, unspecified: Secondary | ICD-10-CM | POA: Diagnosis not present

## 2013-01-29 DIAGNOSIS — Z9010 Acquired absence of unspecified breast and nipple: Secondary | ICD-10-CM | POA: Diagnosis not present

## 2013-01-29 DIAGNOSIS — M129 Arthropathy, unspecified: Secondary | ICD-10-CM | POA: Diagnosis not present

## 2013-01-29 DIAGNOSIS — R011 Cardiac murmur, unspecified: Secondary | ICD-10-CM | POA: Diagnosis not present

## 2013-03-15 ENCOUNTER — Other Ambulatory Visit: Payer: Self-pay | Admitting: Family Medicine

## 2013-03-15 DIAGNOSIS — Z853 Personal history of malignant neoplasm of breast: Secondary | ICD-10-CM

## 2013-03-15 DIAGNOSIS — Z9012 Acquired absence of left breast and nipple: Secondary | ICD-10-CM

## 2013-03-15 DIAGNOSIS — Z9889 Other specified postprocedural states: Secondary | ICD-10-CM

## 2013-03-23 ENCOUNTER — Encounter: Payer: Self-pay | Admitting: *Deleted

## 2013-03-23 DIAGNOSIS — Z923 Personal history of irradiation: Secondary | ICD-10-CM | POA: Insufficient documentation

## 2013-03-23 DIAGNOSIS — C50919 Malignant neoplasm of unspecified site of unspecified female breast: Secondary | ICD-10-CM | POA: Insufficient documentation

## 2013-03-23 NOTE — Progress Notes (Signed)
GYN Location of Tumor / Histology: uterus  Patient presented 2 1/2 months ago with symptoms of: rectal bleeding  Biopsies of endometrium (if applicable) revealed: adenocarcinoma, 01/18/13  Past/Anticipated interventions by Gyn/Onc surgery, if any: 01/18/13 robotic hysterectomy, BSO, bilat lymph node dissection, 17/17 nodes neg  Past/Anticipated interventions by medical oncology, if any: none  Weight changes, if any: none  Bowel/Bladder complaints, if any: none  Nausea/Vomiting, if any: no  Pain issues, if any:  none  SAFETY ISSUES:  Prior radiation? Yes , 02/2006 -03/2006- right breast  Pacemaker/ICD? no  Possible current pregnancy? no  Is the patient on methotrexate? no  Current Complaints / other details:  Denies vaginal discharge, loss of appetite, fatigue. Daughter w/pt today.

## 2013-03-26 ENCOUNTER — Ambulatory Visit
Admission: RE | Admit: 2013-03-26 | Discharge: 2013-03-26 | Disposition: A | Discharge status: Home / Self Care | Payer: Medicare Other | Source: Ambulatory Visit | Attending: Radiation Oncology | Admitting: Radiation Oncology

## 2013-03-26 ENCOUNTER — Encounter: Payer: Self-pay | Admitting: Radiation Oncology

## 2013-03-26 VITALS — BP 121/72 | HR 64 | Temp 97.4°F | Resp 20 | Ht 66.0 in | Wt 210.6 lb

## 2013-03-26 DIAGNOSIS — C549 Malignant neoplasm of corpus uteri, unspecified: Secondary | ICD-10-CM | POA: Diagnosis not present

## 2013-03-26 DIAGNOSIS — K5730 Diverticulosis of large intestine without perforation or abscess without bleeding: Secondary | ICD-10-CM | POA: Insufficient documentation

## 2013-03-26 DIAGNOSIS — I10 Essential (primary) hypertension: Secondary | ICD-10-CM | POA: Diagnosis not present

## 2013-03-26 DIAGNOSIS — Z853 Personal history of malignant neoplasm of breast: Secondary | ICD-10-CM | POA: Insufficient documentation

## 2013-03-26 DIAGNOSIS — Z90710 Acquired absence of both cervix and uterus: Secondary | ICD-10-CM | POA: Diagnosis not present

## 2013-03-26 DIAGNOSIS — C541 Malignant neoplasm of endometrium: Secondary | ICD-10-CM | POA: Insufficient documentation

## 2013-03-26 DIAGNOSIS — Z923 Personal history of irradiation: Secondary | ICD-10-CM | POA: Insufficient documentation

## 2013-03-26 DIAGNOSIS — I872 Venous insufficiency (chronic) (peripheral): Secondary | ICD-10-CM | POA: Insufficient documentation

## 2013-03-26 DIAGNOSIS — Z9010 Acquired absence of unspecified breast and nipple: Secondary | ICD-10-CM | POA: Diagnosis not present

## 2013-03-26 HISTORY — DX: Malignant neoplasm of endometrium: C54.1

## 2013-03-26 NOTE — Progress Notes (Signed)
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 female who is seen out of the courtesy of Dr. Clifton  for an opinion concerning radiation therapy as part of management of patient's recently diagnosed endometrial cancer. Earlier this year the patient presented with an episode of rectal bleeding.  The patient presented to the emergency room. A barium enema was performed which revealed diverticulosis. This was felt to be the source of her bleeding. During the patient's initial evaluation a CT scan of the abdomen and pelvis was performed which revealed fluid within the endometrial cavity. The endometrium also appeared to be dilated. Patient was referred to Dr. Dion Body and ultrasound revealed a distended endometrial canal. In addition there were 2 irregularly shaped soft tissue masses seen within the endometrial canal. Patient proceeded to undergo endometrial biopsy which revealed a FIGO grade 1 endometrioid adenocarcinoma. This patient was then referred to Dr. Clifton  for definitive treatment.   patient proceeded to undergo robotic hysterectomy with bilateral salpingo-oophorectomy and bilateral pelvic lymph node dissection. Patient tolerated the procedure well and was discharged home of after one nights hospital stay. Pathology from the patient's hysterectomy revealed a grade 2 endometrioid adenocarcinoma with focal clear cell features. The tumor invaded to 0.5 cm out of a 2.1 cm thickness of myometrium. 17 benign lymph nodes were recovered from the pelvis area. There was however lymphovascular space  Invasion (LVI) noted in the specimen. In light  of the above findings,  the patient is now referred to radiation oncology to be considered for vaginal brachytherapy.   PREVIOUS RADIATION THERAPY: Yes the patient was diagnosed with intraductal carcinoma the right breast in 2007. She received radiation treatments directed at the right breast as part of breast conservation therapy. The right breast received 48 gray in 24 fractions. The site of presentation in the medial aspect of the breast was boosted further to cumulative dose of 64 gray. Patient has done well as far as her breast cancer is concerned with no recurrence.  She also has a remote history of left breast cancer undergoing a mastectomy in the distant past.  PAST MEDICAL HISTORY:  has a past medical history of Hypertension; Iron deficiency; Venous stasis; Anxiety; Family history of malignant neoplasm of gastrointestinal tract; radiation therapy (02/2006 - 03/2006); Breast cancer; Diverticulosis; DVT (deep venous thrombosis) (1992); Ulcers of both lower extremities; and Endometrial adenocarcinoma (12/25/12).    PAST SURGICAL HISTORY: Past Surgical History  Procedure Laterality Date  . Mastectomy Left 1994    left , Tamoxifen x 17 yrs  . Partial mastectomy Right 2007    radiation  . Diverticulosis    . Abdominal hysterectomy  01/18/13    robotic, bso    FAMILY HISTORY: family history includes Anuerysm in her sister; Breast cancer in her daughter and sister; Cancer in her brother and father; Colon cancer in her brother and sister; and Heart disease in her brother and sister.  SOCIAL HISTORY:  reports that she has never smoked. She has never used smokeless tobacco. She reports that she does not drink alcohol or use illicit drugs.  ALLERGIES: Review of patient's allergies indicates no known allergies.  MEDICATIONS:  Current Outpatient  Prescriptions  Medication Sig Dispense Refill  . ALPRAZolam (XANAX) 0.5 MG tablet As needed      . amiloride-hydrochlorothiazide (MODURETIC) 5-50 MG  tablet One tablet by mouth once daily       . Cholecalciferol (VITAMIN D3) 1000 UNITS CAPS Take 1 capsule by mouth daily.        . ferrous sulfate 325 (65 FE) MG tablet Take 325 mg by mouth daily with breakfast.      . HYDROcodone-acetaminophen (VICODIN) 5-500 MG per tablet One tablet by mouth every six hours as needed for pain       . KLOR-CON M10 10 MEQ tablet One and 1/2 tablets by mouth once daily       . Multiple Vitamins-Minerals (CENTRUM SILVER PO) Take 1 tablet by mouth daily.        . Omega-3 Fatty Acids (FISH OIL) 1000 MG CAPS Take 1 capsule by mouth daily.        . potassium chloride (K-DUR) 10 MEQ tablet        No current facility-administered medications for this encounter.    REVIEW OF SYSTEMS:  A 15 point review of systems is documented in the electronic medical record. This was obtained by the nursing staff. However, I reviewed this with the patient to discuss relevant findings and make appropriate changes.  She denies any pain in the pelvis area urination difficulties or bowel complaints. She denies any further episodes of rectal bleeding.   PHYSICAL EXAM:  height is 5\' 6"  (1.676 m) and weight is 210 lb 9.6 oz (95.528 kg). Her oral temperature is 97.4 F (36.3 C). Her blood pressure is 121/72 and her pulse is 64. Her respiration is 20.   BP 121/72  Pulse 64  Temp(Src) 97.4 F (36.3 C) (Oral)  Resp 20  Ht 5\' 6"  (1.676 m)  Wt 210 lb 9.6 oz (95.528 kg)  BMI 34.01 kg/m2  this is a very pleasant elderly female in no acute distress. She is accompanied by 2 of her daughters on evaluation today.  General Appearance:    Alert, cooperative, no distress, appears stated age  Head:    Normocephalic, without obvious abnormality, atraumatic  Eyes:    PERRL, conjunctiva/corneas clear, EOM's intact     Ears:    Normal TM's and external ear canals, both ears  Nose:   Nares normal, septum midline, mucosa normal, no drainage    or sinus tenderness  Throat:   Lips, mucosa, and tongue  normal; teeth and gums normal  Neck:   Supple, symmetrical, trachea midline, no adenopathy;    thyroid:  no enlargement/tenderness/nodules; no carotid   bruit or JVD  Back:     Symmetric, no curvature, ROM normal, no CVA tenderness  Lungs:     Clear to auscultation bilaterally, respirations unlabored  Chest Wall:    the left chest wall area shows a mastectomy scar without palpable or visible signs recurrence    Heart:    Regular rate and rhythm,  no murmur, rub   or gallop  Breast Exam:    the right breast area shows some mild hyperpigmentation changes. There is some induration at the lumpectomy scar in the medial aspect of the breast but no signs recurrence   Abdomen:     Soft, non-tender, bowel sounds active all four quadrants,    no masses, no organomegaly  Genitalia:    on pelvic examination the external genitalia are unremarkable. A speculum exam is performed. The vaginal cuff is well-healed.  There no mucosal lesions noted. On bimanual examination there no pelvic masses appreciated       Extremities:   Extremities normal, atraumatic, no cyanosis, some edema and ankle and foot areas. Patient does have support stockings in place   Pulses:   2+ and symmetric all extremities  Skin:   Skin color, texture, turgor normal, no rashes or lesions  Lymph nodes:   Cervical, supraclavicular, and axillary nodes normal, no inguinal adenopathy   Neurologic:    normal strength throughout    LABORATORY DATA:  Lab Results  Component Value Date   WBC 11.9* 11/22/2012   HGB 12.5 11/22/2012   HCT 36.8 11/22/2012   MCV 85.2 11/22/2012   PLT 304 11/22/2012   Lab Results  Component Value Date   NA 136 11/22/2012   K 4.2 11/22/2012   CL 99 11/22/2012   CO2 26 11/22/2012   Lab Results  Component Value Date   ALT 12 11/22/2012   AST 15 11/22/2012   ALKPHOS 65 11/22/2012   BILITOT 0.3 11/22/2012     RADIOGRAPHY: No results found.    IMPRESSION:  Stage IA grade 2 endometrioid adenocarcinoma of the uterus.  The patient  would be at risk for vaginal cuff recurrence and I would recommend intracavitary brachytherapy treatments for this patient.  In light of the extensive node dissection with 17 benign lymph nodes recovered, I would not recommend external beam radiation therapy as part of her management.  I discussed the treatment course side effects and potential toxicities of intracavitary brachytherapy treatments with the patient and her family. Patient appears to understand and wishes to proceed with planned course of treatment.  PLAN: Simulation and planning in the near future. The patient will receive 5 intracavitary radiation treatments.  I spent 60 minutes minutes face to face with the patient and more than 50% of that time was spent in counseling and/or coordination of care.   ------------------------------------------------  -----------------------------------  Billie Lade, PhD, MD

## 2013-03-26 NOTE — Progress Notes (Signed)
Please see the Nurse Progress Note in the MD Initial Consult Encounter for this patient. 

## 2013-03-27 ENCOUNTER — Telehealth: Payer: Self-pay | Admitting: *Deleted

## 2013-03-27 NOTE — Telephone Encounter (Signed)
CALLED PATIENT TO INFORM OF HDR CASE STARTING MAY 12, SPOKE WITH PATIENT AND SHE IS AWARE OF THIS.

## 2013-03-30 ENCOUNTER — Telehealth: Payer: Self-pay | Admitting: *Deleted

## 2013-03-30 NOTE — Telephone Encounter (Signed)
Called patient to remind of appts. For Monday May 12, spoke with patient and she is aware of these appts.

## 2013-04-02 ENCOUNTER — Ambulatory Visit
Admission: RE | Admit: 2013-04-02 | Discharge: 2013-04-02 | Disposition: A | Discharge status: Home / Self Care | Payer: Medicare Other | Source: Ambulatory Visit | Attending: Radiation Oncology | Admitting: Radiation Oncology

## 2013-04-02 ENCOUNTER — Other Ambulatory Visit: Payer: Self-pay | Admitting: Radiation Oncology

## 2013-04-02 ENCOUNTER — Encounter: Payer: Self-pay | Admitting: Radiation Oncology

## 2013-04-02 VITALS — BP 145/73 | HR 75 | Temp 98.4°F | Resp 20 | Wt 211.0 lb

## 2013-04-02 VITALS — BP 145/73 | HR 75 | Temp 98.4°F | Resp 20 | Wt 211.6 lb

## 2013-04-02 DIAGNOSIS — Z510 Encounter for antineoplastic radiation therapy: Secondary | ICD-10-CM | POA: Diagnosis not present

## 2013-04-02 DIAGNOSIS — C541 Malignant neoplasm of endometrium: Secondary | ICD-10-CM

## 2013-04-02 DIAGNOSIS — C549 Malignant neoplasm of corpus uteri, unspecified: Secondary | ICD-10-CM | POA: Diagnosis not present

## 2013-04-02 NOTE — Progress Notes (Signed)
   Department of Radiation Oncology  Phone:  [phone] Fax:        [phone]  Vaginal brachytherapy procedure note  The patient was taken to the nursing suite and placed in the dorsolithotomy position. Speculum exam was performed showing the vaginal cuff to be well healed. There are no mucosal lesions noted. The patient proceeded to undergo fitting for her vaginal cylinder. The optimal cylinder diameter was 3 cm diameter cylinder. The patient tolerated the procedure well. Later in the morning the patient will undergo planning for her first high-dose-rate treatment.   Simple treatment device note  The patient had construction of her custom vaginal cylinder which will be used for her 5 high-dose rate treatments. Patient will be treated with three 3.0 cm diameter rings placed proximally within the vaginal vault. More distal will be to 2.5 cm rings.   -----------------------------------  Billie Lade, PhD, MD

## 2013-04-02 NOTE — Progress Notes (Signed)
   Department of Radiation Oncology  Phone:  [phone] Fax:        [phone]  High dose rate brachytherapy procedure note   After planning was complete the patient was transferred to the high dose rate suite   Verification simulation note  The vaginal cylinder was reinserted. An AP and lateral film was obtained in the treatment position after a fiducial Marker was placed within the vaginal cylinder.  This confirmed good position of the vaginal cylinder for treatment.  The vaginal cylinder was attached to the CT/MR stabilization plate to prevent slippage.   High-dose-rate brachytherapy note  The remote afterloading catheter was attached to the vaginal cylinder. The patient then proceeded to undergo her first high-dose-rate treatment directed to the proximal vagina.  The patient was treated with 7 dwell positions using 1 catheter. Prescription dose was 6 gray to the mucosal surface. A treatment length of 3 cm was delivered. Total treatment time was 280.4 seconds.  The patient tolerated the treatment well. After completion of her therapy a radiation survey was performed documenting return of the iridium source into the Nucletron safe period.  -----------------------------------  Billie Lade, PhD, MD

## 2013-04-02 NOTE — Progress Notes (Signed)
Pt states she began having pain in her right foot, outer aspect, over the weekend. She states she has not had pain in her foot before. She takes Advil prn w/good relief. She is not wearing her compression stockings today, has slight swelling of RLE. Pt for HDR ct sim today. Er-educated pt on process today, no questions verbalized.  Fitted for HDR cylinder per Dr Marissa Calamity; pt tol well. Escorted pt to ct sim dept. Updated family in waiting room re: pt's status.

## 2013-04-02 NOTE — Progress Notes (Signed)
Pt states she began having pain in her right foot, outer aspect, over the weekend. She states she has not had pain in her foot before. She takes Advil prn w/good relief. She is not wearing her compression stockings today, has slight swelling of RLE. Pt for HDR ct sim today. Er-educated pt on process today, no questions verbalized.

## 2013-04-02 NOTE — Progress Notes (Signed)
  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  Identity was confirmed.  All relevant records and images related to the planned course of therapy were reviewed.  The patient freely provided informed written consent to proceed with treatment after reviewing the details related to the planned course of therapy. The consent form was witnessed and verified by the simulation staff.  Then, the patient was set-up in a stable reproducible  supine position for radiation therapy.  CT images were obtained.  Surface markings were placed.  The CT images were loaded into the planning software.  Then the target and avoidance structures were contoured.  Treatment planning then occurred.  The radiation prescription was entered and confirmed.  Then, I designed and supervised the construction of a total of 0 medically necessary complex treatment devices.  I have requested :Brachyherapy Isodose Plan.  I have ordered:dose calc.with each treatment.  PLAN:  The patient will receive 30 Gy in 5 fractions. Treatments will be on a weekly basis. Iridium 192 will be the high-dose-rate source.  Treatment will be prescribed to the mucosal surface. Treatment length will be 3 cm.  ________________________________  -----------------------------------  Billie Lade, PhD, MD

## 2013-04-06 ENCOUNTER — Telehealth: Payer: Self-pay | Admitting: *Deleted

## 2013-04-06 NOTE — Telephone Encounter (Signed)
CALLED PATIENT TO REMIND OF HDR TX. FOR 04-09-13, SPOKE WITH PATIENT'S DAUGHTER, LORRAINE AND SHE IS AWARE OF THIS APPT.

## 2013-04-09 ENCOUNTER — Encounter: Payer: Self-pay | Admitting: Radiation Oncology

## 2013-04-09 ENCOUNTER — Ambulatory Visit
Admission: RE | Admit: 2013-04-09 | Discharge: 2013-04-09 | Disposition: A | Discharge status: Home / Self Care | Payer: Medicare Other | Source: Ambulatory Visit | Attending: Radiation Oncology | Admitting: Radiation Oncology

## 2013-04-09 DIAGNOSIS — C541 Malignant neoplasm of endometrium: Secondary | ICD-10-CM

## 2013-04-09 DIAGNOSIS — Z510 Encounter for antineoplastic radiation therapy: Secondary | ICD-10-CM | POA: Diagnosis not present

## 2013-04-09 DIAGNOSIS — C549 Malignant neoplasm of corpus uteri, unspecified: Secondary | ICD-10-CM | POA: Diagnosis not present

## 2013-04-09 NOTE — Progress Notes (Signed)
   Department of Radiation Oncology  Phone:  [phone] Fax:        [phone]  High dose rate brachytherapy procedure note   Simple treatment device note  The patient had construction of her custom vaginal cylinder which will be used for her high-dose rate treatments. Patient will be treated with three 3.0 cm diameter rings placed proximally within the vaginal vault. More distal will be two 2.5 cm rings.    Vaginal brachytherapy procedure note  The pateint was placed on the high-dose-rate treatment table. She proceeded to undergo pelvic examination. There no pelvic masses appreciated. The patient underwent serial dilation of the vaginal introitus. Her vaginal cylinder was inserted without difficulty. This was affixed to the CT/MR stabilization plate to prevent slippage. The patient tolerated the procedure well.   Verification simulation note   An AP and lateral film was obtained in the treatment position after a fiducial Marker was placed within the vaginal cylinder. This confirmed good position of the vaginal cylinder for treatment.    High-dose-rate brachytherapy note  The remote afterloading catheter was attached to the vaginal cylinder. The patient then proceeded to undergo her second high-dose-rate treatment directed to the proximal vagina. The patient was treated with 7 dwell positions using 1 catheter. Prescription dose was 6 gray to the mucosal surface. A treatment length of 3 cm was delivered. Total treatment time was 298.9 seconds. The patient tolerated the treatment well. After completion of her therapy a radiation survey was performed documenting return of the iridium source into the Nucletron safe period.   -----------------------------------  Billie Lade, PhD, MD

## 2013-04-13 ENCOUNTER — Telehealth: Payer: Self-pay | Admitting: *Deleted

## 2013-04-13 NOTE — Telephone Encounter (Signed)
CALLED PATIENT TO ALTER HDR TX. ON 04-17-13, APPT. MOVED TO 9AM FROM 10 AM, SPOKE WITH PATIENT AND SHE IS GOOD WITH THIS

## 2013-04-17 ENCOUNTER — Ambulatory Visit
Admission: RE | Admit: 2013-04-17 | Discharge: 2013-04-17 | Disposition: A | Discharge status: Home / Self Care | Payer: Medicare Other | Source: Ambulatory Visit | Attending: Radiation Oncology | Admitting: Radiation Oncology

## 2013-04-17 DIAGNOSIS — Z510 Encounter for antineoplastic radiation therapy: Secondary | ICD-10-CM | POA: Diagnosis not present

## 2013-04-17 DIAGNOSIS — C541 Malignant neoplasm of endometrium: Secondary | ICD-10-CM

## 2013-04-17 DIAGNOSIS — C549 Malignant neoplasm of corpus uteri, unspecified: Secondary | ICD-10-CM | POA: Diagnosis not present

## 2013-04-17 NOTE — Progress Notes (Signed)
High dose rate brachytherapy procedure note    Simple treatment device note   The patient had construction of her custom vaginal cylinder which will be used for her high-dose rate treatments. Patient will be treated with three 3.0 cm diameter rings placed proximally within the vaginal vault. More distal will be two 2.5 cm rings.   Vaginal brachytherapy procedure note   The pateint was placed on the high-dose-rate treatment table. She proceeded to undergo pelvic examination. There no pelvic masses appreciated. The patient underwent serial dilation of the vaginal introitus. Her vaginal cylinder was inserted without difficulty. This was affixed to the CT/MR stabilization plate to prevent slippage. The patient tolerated the procedure well.   Verification simulation note   An AP and lateral film was obtained in the treatment position after a fiducial Marker was placed within the vaginal cylinder. This confirmed good position of the vaginal cylinder for treatment.   High-dose-rate brachytherapy note   The remote afterloading catheter was attached to the vaginal cylinder. The patient then proceeded to undergo her third high-dose-rate treatment directed to the proximal vagina. The patient was treated with 7 dwell positions using 1 catheter. Prescription dose was 6 gray to the mucosal surface. A treatment length of 3 cm was delivered. Total treatment time was 322.1 seconds. The patient tolerated the treatment well. After completion of her therapy a radiation survey was performed documenting return of the iridium source into the Nucletron safe period.  -----------------------------------   Billie Lade, PhD, MD

## 2013-04-20 ENCOUNTER — Telehealth: Payer: Self-pay | Admitting: *Deleted

## 2013-04-20 NOTE — Telephone Encounter (Signed)
CALLED PATIENT TO REMIND OF HDR TX. FOR 04-23-13 AT 11:00 AM, SPOKE WITH PATIENT AND SHE IS AWARE OF THIS APPT.

## 2013-04-23 ENCOUNTER — Ambulatory Visit
Admission: RE | Admit: 2013-04-23 | Discharge: 2013-04-23 | Disposition: A | Discharge status: Home / Self Care | Payer: Medicare Other | Source: Ambulatory Visit | Attending: Radiation Oncology | Admitting: Radiation Oncology

## 2013-04-23 DIAGNOSIS — C541 Malignant neoplasm of endometrium: Secondary | ICD-10-CM

## 2013-04-23 DIAGNOSIS — Z510 Encounter for antineoplastic radiation therapy: Secondary | ICD-10-CM | POA: Diagnosis not present

## 2013-04-23 DIAGNOSIS — C549 Malignant neoplasm of corpus uteri, unspecified: Secondary | ICD-10-CM | POA: Diagnosis not present

## 2013-04-23 NOTE — Progress Notes (Signed)
   Department of Radiation Oncology  Phone:  [phone] Fax:        [phone]  High dose rate brachytherapy procedure note   Simple treatment device note   The patient had construction of her custom vaginal cylinder which will be used for her high-dose rate treatments. Patient will be treated with three 3.0 cm diameter rings placed proximally within the vaginal vault. More distal will be two 2.5 cm rings.   Vaginal brachytherapy procedure note   The pateint was placed on the high-dose-rate treatment table. She proceeded to undergo pelvic examination. There no pelvic masses appreciated. The patient underwent serial dilation of the vaginal introitus. Her vaginal cylinder was inserted without difficulty. This was affixed to the CT/MR stabilization plate to prevent slippage. The patient tolerated the procedure well.   Verification simulation note   An AP and lateral film was obtained in the treatment position after a fiducial Marker was placed within the vaginal cylinder. This confirmed good position of the vaginal cylinder for treatment.   High-dose-rate brachytherapy note   The remote afterloading catheter was attached to the vaginal cylinder. The patient then proceeded to undergo her fourth high-dose-rate treatment directed to the proximal vagina. The patient was treated with 7 dwell positions using 1 catheter. Prescription dose was 6 gray to the mucosal surface. A treatment length of 3 cm was delivered. Total treatment time was 341 seconds. The patient tolerated the treatment well. After completion of her therapy a radiation survey was performed documenting return of the iridium source into the Nucletron safe period.  -----------------------------------   Billie Lade, PhD, MD

## 2013-04-27 ENCOUNTER — Telehealth: Payer: Self-pay | Admitting: *Deleted

## 2013-04-27 NOTE — Telephone Encounter (Signed)
CALLED PATIENT TO REMIND OF HDR TX. FOR 04-30-13, LVM FOR A RETURN CALL

## 2013-04-30 ENCOUNTER — Encounter: Payer: Self-pay | Admitting: Radiation Oncology

## 2013-04-30 ENCOUNTER — Ambulatory Visit
Admission: RE | Admit: 2013-04-30 | Discharge: 2013-04-30 | Disposition: A | Discharge status: Home / Self Care | Payer: Medicare Other | Source: Ambulatory Visit | Attending: Radiation Oncology | Admitting: Radiation Oncology

## 2013-04-30 DIAGNOSIS — Z510 Encounter for antineoplastic radiation therapy: Secondary | ICD-10-CM | POA: Diagnosis not present

## 2013-04-30 DIAGNOSIS — C541 Malignant neoplasm of endometrium: Secondary | ICD-10-CM

## 2013-04-30 DIAGNOSIS — C549 Malignant neoplasm of corpus uteri, unspecified: Secondary | ICD-10-CM | POA: Diagnosis not present

## 2013-04-30 NOTE — Progress Notes (Signed)
  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 A 3 cm cylinder was used to deliver the treatment. A 3 cm treatment length was used.  Narrative: The patient tolerated radiation treatment relatively well.   She had some mild vaginal discomfort and mild diarrhea towards the end of her therapy. She denied any urinary symptoms.  Plan: The patient has completed radiation treatment. The patient will return to radiation oncology clinic for routine followup in one month. I advised them to call or return sooner if they have any questions or concerns related to their recovery or treatment.  -----------------------------------  Billie Lade, PhD, MD

## 2013-04-30 NOTE — Progress Notes (Signed)
Department of Radiation Oncology  Phone: [phone]  Fax: [phone]    High dose rate brachytherapy procedure note    Simple treatment device note   The patient had construction of her custom vaginal cylinder which will be used for her high-dose rate treatments. Patient will be treated with three 3.0 cm diameter rings placed proximally within the vaginal vault. More distal will be two 2.5 cm rings.   Vaginal brachytherapy procedure note   The pateint was placed on the high-dose-rate treatment table. She proceeded to undergo pelvic examination. There no pelvic masses appreciated. The patient underwent serial dilation of the vaginal introitus. Her vaginal cylinder was inserted without difficulty. This was affixed to the CT/MR stabilization plate to prevent slippage. The patient tolerated the procedure well.   Verification simulation note   An AP and lateral film was obtained in the treatment position after a fiducial Marker was placed within the vaginal cylinder. This confirmed good position of the vaginal cylinder for treatment.   High-dose-rate brachytherapy note   The remote afterloading catheter was attached to the vaginal cylinder. The patient then proceeded to undergo her fifth high-dose-rate treatment directed to the proximal vagina. The patient was treated with 7 dwell positions using 1 catheter. Prescription dose was 6 gray to the mucosal surface. A treatment length of 3 cm was delivered. Total treatment time was 362.4 seconds. The patient tolerated the treatment well. After completion of her therapy a radiation survey was performed documenting return of the iridium source into the Nucletron safe period.  -----------------------------------   Billie Lade, PhD, MD

## 2013-05-03 DIAGNOSIS — D649 Anemia, unspecified: Secondary | ICD-10-CM | POA: Diagnosis not present

## 2013-05-03 DIAGNOSIS — I10 Essential (primary) hypertension: Secondary | ICD-10-CM | POA: Diagnosis not present

## 2013-05-03 DIAGNOSIS — G479 Sleep disorder, unspecified: Secondary | ICD-10-CM | POA: Diagnosis not present

## 2013-05-03 DIAGNOSIS — F411 Generalized anxiety disorder: Secondary | ICD-10-CM | POA: Diagnosis not present

## 2013-05-07 ENCOUNTER — Encounter: Payer: Self-pay | Admitting: Radiation Oncology

## 2013-06-01 ENCOUNTER — Encounter: Payer: Self-pay | Admitting: Radiation Oncology

## 2013-06-04 ENCOUNTER — Encounter: Payer: Self-pay | Admitting: Radiation Oncology

## 2013-06-04 ENCOUNTER — Ambulatory Visit
Admission: RE | Admit: 2013-06-04 | Discharge: 2013-06-04 | Disposition: A | Discharge status: Home / Self Care | Payer: Medicare Other | Source: Ambulatory Visit | Attending: Radiation Oncology | Admitting: Radiation Oncology

## 2013-06-04 VITALS — BP 147/61 | HR 81 | Temp 97.7°F | Resp 20 | Wt 204.8 lb

## 2013-06-04 DIAGNOSIS — C541 Malignant neoplasm of endometrium: Secondary | ICD-10-CM

## 2013-06-04 NOTE — Progress Notes (Signed)
Pt denies bladder/bowel issues, vaginal discharge, loss of appetite. She c/o fatigue that comes and goes, lower back pain which is intermittent. She takes Advil prn, applies heat pack prn w/good relief.

## 2013-06-04 NOTE — Progress Notes (Signed)
  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  She is doing well at this time. She denies any discomfort within the vaginal region or bleeding. She denies any urination difficulties or bowel complaints. Her appetite is good. She denies any fatigue.                              ALLERGIES:  has No Known Allergies.  Meds: Current Outpatient Prescriptions  Medication Sig Dispense Refill  . ibuprofen (ADVIL,MOTRIN) 200 MG tablet Take 200 mg by mouth every 6 (six) hours as needed for pain.      [name] ALPRAZolam (XANAX) 0.5 MG tablet As needed      . amiloride-hydrochlorothiazide (MODURETIC) 5-50 MG tablet One tablet by mouth once daily       . Cholecalciferol (VITAMIN D3) 1000 UNITS CAPS Take 1 capsule by mouth daily.        . ferrous sulfate 325 (65 FE) MG tablet Take 325 mg by mouth daily with breakfast.      . KLOR-CON M10 10 MEQ tablet One and 1/2 tablets by mouth once daily       . Multiple Vitamins-Minerals (CENTRUM SILVER PO) Take 1 tablet by mouth daily.        . Omega-3 Fatty Acids (FISH OIL) 1000 MG CAPS Take 1 capsule by mouth daily.        . potassium chloride (K-DUR) 10 MEQ tablet        No current facility-administered medications for this encounter.    Physical Findings: The patient is in no acute distress. Patient is alert and oriented.  weight is 204 lb 12.8 oz (92.897 kg). Her oral temperature is 97.7 F (36.5 C). Her blood pressure is 147/61 and her pulse is 81. Her respiration is 20. .  The lungs are clear to auscultation. The heart has a regular rhythm and rate. The abdomen is soft and nontender with normal bowel sounds.  A Pap smear and pelvic exam as not performed today in light of the patient's recent completion of treatment.  Lab Findings: Lab Results  Component Value Date   WBC 11.9* 11/22/2012   HGB 12.5 11/22/2012   HCT 36.8 11/22/2012   MCV 85.2 11/22/2012   PLT 304 11/22/2012    @LASTCHEM @  Radiographic Findings: No results found.  Impression:  The patient is recovering from the effects of radiation.    Plan:  Routine followup in 6 months. In the interim the patient will be seen by gynecologic oncology. Today the patient was given a vaginal dilator and instructions on its use.  _____________________________________  -----------------------------------  Billie Lade, PhD, MD

## 2013-06-25 DIAGNOSIS — M549 Dorsalgia, unspecified: Secondary | ICD-10-CM | POA: Diagnosis not present

## 2013-06-25 DIAGNOSIS — L259 Unspecified contact dermatitis, unspecified cause: Secondary | ICD-10-CM | POA: Diagnosis not present

## 2013-06-25 DIAGNOSIS — F411 Generalized anxiety disorder: Secondary | ICD-10-CM | POA: Diagnosis not present

## 2013-09-06 DIAGNOSIS — D509 Iron deficiency anemia, unspecified: Secondary | ICD-10-CM | POA: Diagnosis not present

## 2013-09-06 DIAGNOSIS — Z90710 Acquired absence of both cervix and uterus: Secondary | ICD-10-CM | POA: Diagnosis not present

## 2013-09-06 DIAGNOSIS — Z9010 Acquired absence of unspecified breast and nipple: Secondary | ICD-10-CM | POA: Diagnosis not present

## 2013-09-06 DIAGNOSIS — Z853 Personal history of malignant neoplasm of breast: Secondary | ICD-10-CM | POA: Diagnosis not present

## 2013-09-06 DIAGNOSIS — L97909 Non-pressure chronic ulcer of unspecified part of unspecified lower leg with unspecified severity: Secondary | ICD-10-CM | POA: Diagnosis not present

## 2013-09-06 DIAGNOSIS — I872 Venous insufficiency (chronic) (peripheral): Secondary | ICD-10-CM | POA: Diagnosis not present

## 2013-09-06 DIAGNOSIS — I10 Essential (primary) hypertension: Secondary | ICD-10-CM | POA: Diagnosis not present

## 2013-09-06 DIAGNOSIS — C549 Malignant neoplasm of corpus uteri, unspecified: Secondary | ICD-10-CM | POA: Diagnosis not present

## 2013-09-06 DIAGNOSIS — Z9079 Acquired absence of other genital organ(s): Secondary | ICD-10-CM | POA: Diagnosis not present

## 2013-09-20 ENCOUNTER — Encounter (HOSPITAL_BASED_OUTPATIENT_CLINIC_OR_DEPARTMENT_OTHER): Payer: Medicare Other | Attending: Internal Medicine

## 2013-09-20 DIAGNOSIS — Z90710 Acquired absence of both cervix and uterus: Secondary | ICD-10-CM | POA: Diagnosis not present

## 2013-09-20 DIAGNOSIS — I872 Venous insufficiency (chronic) (peripheral): Secondary | ICD-10-CM | POA: Diagnosis not present

## 2013-09-20 DIAGNOSIS — L97809 Non-pressure chronic ulcer of other part of unspecified lower leg with unspecified severity: Secondary | ICD-10-CM | POA: Diagnosis not present

## 2013-09-20 DIAGNOSIS — Z923 Personal history of irradiation: Secondary | ICD-10-CM | POA: Insufficient documentation

## 2013-09-20 DIAGNOSIS — Z8544 Personal history of malignant neoplasm of other female genital organs: Secondary | ICD-10-CM | POA: Diagnosis not present

## 2013-09-20 DIAGNOSIS — I10 Essential (primary) hypertension: Secondary | ICD-10-CM | POA: Insufficient documentation

## 2013-09-20 DIAGNOSIS — Z9010 Acquired absence of unspecified breast and nipple: Secondary | ICD-10-CM | POA: Insufficient documentation

## 2013-09-20 DIAGNOSIS — L97309 Non-pressure chronic ulcer of unspecified ankle with unspecified severity: Secondary | ICD-10-CM | POA: Diagnosis not present

## 2013-09-20 DIAGNOSIS — Z86718 Personal history of other venous thrombosis and embolism: Secondary | ICD-10-CM | POA: Insufficient documentation

## 2013-09-21 NOTE — Progress Notes (Signed)
[facility]  NAME:  [name], [name]           ACCOUNT NO.:  [id number]  MEDICAL RECORD NO.:  [id number]      DATE OF BIRTH:  Jul 09, 1925  PHYSICIAN:  Maxwell Caul, M.D. VISIT DATE:  09/20/2013                                  OFFICE VISIT   [name] is a very pleasant 79 year old woman we actually cared for in the late part of 2012 and the early part of 2013.  She has a history of very significant venous stasis, stasis dermatitis, venous hypertension and wounds as a result of this.  She had a very difficult wound, I believe on the medial aspect of her right leg and also on her left leg, which we managed to heal out in mid part of 2013.  She was compliant with pressure stockings.  After she left here, she saw Dr. Coralie Carpen, at the vein center.  She had evidence of superficial reflux with significant postthrombotic obstruction of the deep system.  There was uncertainty at that time whether intervention would be beneficial in this woman's case. The thought was to continue to use pressure stockings and follow her along.  The issue really seems to have done well up until about a month ago when she developed recurred wounds on the left leg.  She has continued to try to doctor these at home; however, she is in to see [place] today with recurrent venous ulcerations.  PAST MEDICAL HISTORY:  Reviewed.  She tells me that since she was last here, she had uterine cancer, status post hysterectomy and history of radiation to this area.  This was in February 2014 and subsequently she has had a mastectomy 25 years ago on the right.  She has had a history of DVT, hypertension, and anemia.  MEDICATION:  List is reviewed.  PHYSICAL EXAMINATION:  VITAL SIGNS:  Temperature is 97.7, pulse 67, respirations 18, blood pressure 151/73, weight is 210 pounds.  The areas of most concern are on the medial aspect of her left ankle. This is fairly substantial area that measures 4  x 2.3 x 0.1.  This is covered with a tight fibrous eschar, which was partially manually debrided today with a curette.  She has a much smaller wound on her left lateral leg.  Vascular ABIs as calculated in this clinic are 1.1 on the left.  She has good palpable dorsalis pedis pulses.  IMPRESSION:  Recurrent venous stasis ulcerations in the setting of venous hypertension and known superficial reflux and post thrombotic obstruction of the deep system.  The wound area was debrided.  We will need to proceed with a Santyl base dressing with Unna wrap.  The patient is familiar with this from previous stays with [place] as noted.  These areas do not look as ominous as the last time she was here and never the less, the area on the left medial ankle was likely to be somewhat problematic.          ______________________________ Maxwell Caul, M.D.     MGR/MEDQ  D:  09/20/2013  T:  09/21/2013  Job:  [id number]

## 2013-09-27 ENCOUNTER — Encounter (HOSPITAL_BASED_OUTPATIENT_CLINIC_OR_DEPARTMENT_OTHER): Payer: Medicare Other | Attending: Internal Medicine

## 2013-09-27 DIAGNOSIS — L97909 Non-pressure chronic ulcer of unspecified part of unspecified lower leg with unspecified severity: Secondary | ICD-10-CM | POA: Insufficient documentation

## 2013-09-27 DIAGNOSIS — L02419 Cutaneous abscess of limb, unspecified: Secondary | ICD-10-CM | POA: Insufficient documentation

## 2013-09-27 DIAGNOSIS — I87319 Chronic venous hypertension (idiopathic) with ulcer of unspecified lower extremity: Secondary | ICD-10-CM | POA: Diagnosis not present

## 2013-10-04 DIAGNOSIS — I87319 Chronic venous hypertension (idiopathic) with ulcer of unspecified lower extremity: Secondary | ICD-10-CM | POA: Diagnosis not present

## 2013-10-04 DIAGNOSIS — L02419 Cutaneous abscess of limb, unspecified: Secondary | ICD-10-CM | POA: Diagnosis not present

## 2013-10-11 DIAGNOSIS — L02419 Cutaneous abscess of limb, unspecified: Secondary | ICD-10-CM | POA: Diagnosis not present

## 2013-10-11 DIAGNOSIS — I87319 Chronic venous hypertension (idiopathic) with ulcer of unspecified lower extremity: Secondary | ICD-10-CM | POA: Diagnosis not present

## 2013-10-25 ENCOUNTER — Encounter (HOSPITAL_BASED_OUTPATIENT_CLINIC_OR_DEPARTMENT_OTHER): Payer: Medicare Other | Attending: Internal Medicine

## 2013-10-25 DIAGNOSIS — L97909 Non-pressure chronic ulcer of unspecified part of unspecified lower leg with unspecified severity: Secondary | ICD-10-CM | POA: Insufficient documentation

## 2013-10-25 DIAGNOSIS — I87319 Chronic venous hypertension (idiopathic) with ulcer of unspecified lower extremity: Secondary | ICD-10-CM | POA: Diagnosis not present

## 2013-11-01 DIAGNOSIS — I87319 Chronic venous hypertension (idiopathic) with ulcer of unspecified lower extremity: Secondary | ICD-10-CM | POA: Diagnosis not present

## 2013-11-06 ENCOUNTER — Ambulatory Visit
Admission: RE | Admit: 2013-11-06 | Discharge: 2013-11-06 | Disposition: A | Discharge status: Home / Self Care | Payer: Medicare Other | Source: Ambulatory Visit | Attending: Radiation Oncology | Admitting: Radiation Oncology

## 2013-11-06 ENCOUNTER — Encounter: Payer: Self-pay | Admitting: Radiation Oncology

## 2013-11-06 VITALS — BP 160/70 | HR 84 | Temp 97.7°F | Ht 68.0 in | Wt 210.3 lb

## 2013-11-06 DIAGNOSIS — C541 Malignant neoplasm of endometrium: Secondary | ICD-10-CM

## 2013-11-06 NOTE — Progress Notes (Signed)
[name] here for follow up after treatment for endometriod cancer.  She denies pain.  She reports urinary frequency and having to get up 4 times a night.  She denies vaginal/rectal bleeding.  She denies fatigue.  She is going to the wound center for an ulcer on her left leg.

## 2013-11-06 NOTE — Progress Notes (Signed)
  Radiation Oncology         [phone]) [phone] ________________________________  Name: [name] MRN: [id number]  Date: 11/06/2013  DOB: [birth year]  Follow-Up Visit Note  CC: Gretel Acre, MD  Gretel Acre, MD  Diagnosis:   Stage IA grade 2 endometrial adenocarcinoma of the uterus  Interval Since Last Radiation:  6  months , the patient completed intracavitary brachyherapy treatments after surgery  Narrative:  The patient presented to the department today without a scheduled followup appointment. Patient was worked and after further questioning she does have an appointment with Dr. Clifton Velecia Ovitt in January. A pelvic exam as not performed in light of her upcoming exam in early January. Patient denies any vaginal bleeding. She is being seen at the wound center for ulcers along her left leg. She is using her vaginal dilator.                           ALLERGIES:  has No Known Allergies.  Meds: Current Outpatient Prescriptions  Medication Sig Dispense Refill  . ALPRAZolam (XANAX) 0.5 MG tablet As needed      . amiloride-hydrochlorothiazide (MODURETIC) 5-50 MG tablet One tablet by mouth once daily       . Horse Chestnut 300 MG CAPS Take 1 capsule by mouth daily.      [name] ibuprofen (ADVIL,MOTRIN) 200 MG tablet Take 200 mg by mouth every 6 (six) hours as needed for pain.      [name] KLOR-CON M10 10 MEQ tablet One and 1/2 tablets by mouth once daily       . Magnesium 250 MG TABS Take 1 tablet by mouth daily.      . Multiple Vitamins-Minerals (CENTRUM SILVER PO) Take 1 tablet by mouth daily.        . Omega-3 Fatty Acids (FISH OIL) 1000 MG CAPS Take 1 capsule by mouth daily.        . traZODone (DESYREL) 50 MG tablet Take 25 mg by mouth at bedtime. TAKES 1/2 TABLET      . vitamin B-12 (CYANOCOBALAMIN) 50 MCG tablet Take 50 mcg by mouth daily.      . Cholecalciferol (VITAMIN D3) 1000 UNITS CAPS Take 1 capsule by mouth daily.        . ferrous sulfate 325 (65 FE) MG tablet Take 325 mg by mouth daily with  breakfast.       No current facility-administered medications for this encounter.    Physical Findings: The patient is in no acute distress. Patient is alert and oriented.  height is 5\' 8"  (1.727 m) and weight is 210 lb 4.8 oz (95.391 kg). Her temperature is 97.7 F (36.5 C). Her blood pressure is 160/70 and her pulse is 84. Her oxygen saturation is 99%. .  No significant changes.  Lab Findings: Lab Results  Component Value Date   WBC 11.9* 11/22/2012   HGB 12.5 11/22/2012   HCT 36.8 11/22/2012   MCV 85.2 11/22/2012   PLT 304 11/22/2012      Radiographic Findings: No results found.    Plan:  The patient will be scheduled for followup exam in 4 months which will be in between her gynecologic oncology followup appointments  _____________________________________ -----------------------------------  Billie Lade, PhD, MD

## 2013-11-08 DIAGNOSIS — I87319 Chronic venous hypertension (idiopathic) with ulcer of unspecified lower extremity: Secondary | ICD-10-CM | POA: Diagnosis not present

## 2013-11-23 ENCOUNTER — Encounter (HOSPITAL_BASED_OUTPATIENT_CLINIC_OR_DEPARTMENT_OTHER): Payer: Medicare Other | Attending: Internal Medicine

## 2013-11-23 DIAGNOSIS — L97909 Non-pressure chronic ulcer of unspecified part of unspecified lower leg with unspecified severity: Secondary | ICD-10-CM | POA: Diagnosis not present

## 2013-11-23 DIAGNOSIS — I87339 Chronic venous hypertension (idiopathic) with ulcer and inflammation of unspecified lower extremity: Secondary | ICD-10-CM | POA: Insufficient documentation

## 2013-11-23 DIAGNOSIS — I872 Venous insufficiency (chronic) (peripheral): Secondary | ICD-10-CM | POA: Diagnosis not present

## 2013-11-29 DIAGNOSIS — L97909 Non-pressure chronic ulcer of unspecified part of unspecified lower leg with unspecified severity: Secondary | ICD-10-CM | POA: Diagnosis not present

## 2013-11-29 DIAGNOSIS — I872 Venous insufficiency (chronic) (peripheral): Secondary | ICD-10-CM | POA: Diagnosis not present

## 2013-11-29 DIAGNOSIS — I87339 Chronic venous hypertension (idiopathic) with ulcer and inflammation of unspecified lower extremity: Secondary | ICD-10-CM | POA: Diagnosis not present

## 2013-12-06 DIAGNOSIS — I87339 Chronic venous hypertension (idiopathic) with ulcer and inflammation of unspecified lower extremity: Secondary | ICD-10-CM | POA: Diagnosis not present

## 2013-12-06 DIAGNOSIS — I872 Venous insufficiency (chronic) (peripheral): Secondary | ICD-10-CM | POA: Diagnosis not present

## 2013-12-06 DIAGNOSIS — L97909 Non-pressure chronic ulcer of unspecified part of unspecified lower leg with unspecified severity: Secondary | ICD-10-CM | POA: Diagnosis not present

## 2013-12-13 DIAGNOSIS — L97909 Non-pressure chronic ulcer of unspecified part of unspecified lower leg with unspecified severity: Secondary | ICD-10-CM | POA: Diagnosis not present

## 2013-12-13 DIAGNOSIS — I87339 Chronic venous hypertension (idiopathic) with ulcer and inflammation of unspecified lower extremity: Secondary | ICD-10-CM | POA: Diagnosis not present

## 2013-12-13 DIAGNOSIS — I872 Venous insufficiency (chronic) (peripheral): Secondary | ICD-10-CM | POA: Diagnosis not present

## 2013-12-20 DIAGNOSIS — I10 Essential (primary) hypertension: Secondary | ICD-10-CM | POA: Diagnosis not present

## 2013-12-20 DIAGNOSIS — L97909 Non-pressure chronic ulcer of unspecified part of unspecified lower leg with unspecified severity: Secondary | ICD-10-CM | POA: Diagnosis not present

## 2013-12-20 DIAGNOSIS — Z9010 Acquired absence of unspecified breast and nipple: Secondary | ICD-10-CM | POA: Diagnosis not present

## 2013-12-20 DIAGNOSIS — Z9079 Acquired absence of other genital organ(s): Secondary | ICD-10-CM | POA: Diagnosis not present

## 2013-12-20 DIAGNOSIS — Z86718 Personal history of other venous thrombosis and embolism: Secondary | ICD-10-CM | POA: Diagnosis not present

## 2013-12-20 DIAGNOSIS — Z1272 Encounter for screening for malignant neoplasm of vagina: Secondary | ICD-10-CM | POA: Diagnosis not present

## 2013-12-20 DIAGNOSIS — I87339 Chronic venous hypertension (idiopathic) with ulcer and inflammation of unspecified lower extremity: Secondary | ICD-10-CM | POA: Diagnosis not present

## 2013-12-20 DIAGNOSIS — Z8042 Family history of malignant neoplasm of prostate: Secondary | ICD-10-CM | POA: Diagnosis not present

## 2013-12-20 DIAGNOSIS — C549 Malignant neoplasm of corpus uteri, unspecified: Secondary | ICD-10-CM | POA: Diagnosis not present

## 2013-12-20 DIAGNOSIS — C541 Malignant neoplasm of endometrium: Secondary | ICD-10-CM | POA: Insufficient documentation

## 2013-12-20 DIAGNOSIS — Z79899 Other long term (current) drug therapy: Secondary | ICD-10-CM | POA: Diagnosis not present

## 2013-12-20 DIAGNOSIS — Z803 Family history of malignant neoplasm of breast: Secondary | ICD-10-CM | POA: Diagnosis not present

## 2013-12-20 DIAGNOSIS — Z853 Personal history of malignant neoplasm of breast: Secondary | ICD-10-CM | POA: Diagnosis not present

## 2013-12-20 DIAGNOSIS — D509 Iron deficiency anemia, unspecified: Secondary | ICD-10-CM | POA: Diagnosis not present

## 2013-12-20 DIAGNOSIS — Z8249 Family history of ischemic heart disease and other diseases of the circulatory system: Secondary | ICD-10-CM | POA: Diagnosis not present

## 2013-12-20 DIAGNOSIS — Z800 Family history of malignant neoplasm of digestive organs: Secondary | ICD-10-CM | POA: Diagnosis not present

## 2013-12-20 DIAGNOSIS — I872 Venous insufficiency (chronic) (peripheral): Secondary | ICD-10-CM | POA: Diagnosis not present

## 2013-12-20 DIAGNOSIS — Z90710 Acquired absence of both cervix and uterus: Secondary | ICD-10-CM | POA: Diagnosis not present

## 2013-12-27 ENCOUNTER — Encounter (HOSPITAL_BASED_OUTPATIENT_CLINIC_OR_DEPARTMENT_OTHER): Payer: Medicare Other | Attending: Internal Medicine

## 2013-12-27 DIAGNOSIS — I87339 Chronic venous hypertension (idiopathic) with ulcer and inflammation of unspecified lower extremity: Secondary | ICD-10-CM | POA: Insufficient documentation

## 2013-12-27 DIAGNOSIS — L97909 Non-pressure chronic ulcer of unspecified part of unspecified lower leg with unspecified severity: Principal | ICD-10-CM

## 2014-01-03 DIAGNOSIS — L97909 Non-pressure chronic ulcer of unspecified part of unspecified lower leg with unspecified severity: Secondary | ICD-10-CM | POA: Diagnosis not present

## 2014-01-03 DIAGNOSIS — I87339 Chronic venous hypertension (idiopathic) with ulcer and inflammation of unspecified lower extremity: Secondary | ICD-10-CM | POA: Diagnosis not present

## 2014-01-16 DIAGNOSIS — I87339 Chronic venous hypertension (idiopathic) with ulcer and inflammation of unspecified lower extremity: Secondary | ICD-10-CM | POA: Diagnosis not present

## 2014-01-16 DIAGNOSIS — L97909 Non-pressure chronic ulcer of unspecified part of unspecified lower leg with unspecified severity: Secondary | ICD-10-CM | POA: Diagnosis not present

## 2014-01-24 ENCOUNTER — Encounter (HOSPITAL_BASED_OUTPATIENT_CLINIC_OR_DEPARTMENT_OTHER): Payer: Medicare Other | Attending: Internal Medicine

## 2014-01-24 DIAGNOSIS — L97809 Non-pressure chronic ulcer of other part of unspecified lower leg with unspecified severity: Secondary | ICD-10-CM | POA: Insufficient documentation

## 2014-02-14 ENCOUNTER — Ambulatory Visit
Admission: RE | Admit: 2014-02-14 | Discharge: 2014-02-14 | Disposition: A | Discharge status: Home / Self Care | Payer: Medicare Other | Source: Ambulatory Visit | Attending: Radiation Oncology | Admitting: Radiation Oncology

## 2014-02-14 ENCOUNTER — Other Ambulatory Visit: Payer: Self-pay | Admitting: Radiation Oncology

## 2014-02-14 DIAGNOSIS — R9389 Abnormal findings on diagnostic imaging of other specified body structures: Secondary | ICD-10-CM | POA: Diagnosis not present

## 2014-02-14 DIAGNOSIS — C549 Malignant neoplasm of corpus uteri, unspecified: Secondary | ICD-10-CM

## 2014-03-11 ENCOUNTER — Other Ambulatory Visit (HOSPITAL_COMMUNITY)
Admission: RE | Admit: 2014-03-11 | Discharge: 2014-03-11 | Disposition: A | Discharge status: Home / Self Care | Payer: Medicare Other | Source: Ambulatory Visit | Attending: Radiation Oncology | Admitting: Radiation Oncology

## 2014-03-11 ENCOUNTER — Ambulatory Visit
Admission: RE | Admit: 2014-03-11 | Discharge: 2014-03-11 | Disposition: A | Discharge status: Home / Self Care | Payer: Medicare Other | Source: Ambulatory Visit | Attending: Radiation Oncology | Admitting: Radiation Oncology

## 2014-03-11 ENCOUNTER — Encounter: Payer: Self-pay | Admitting: Radiation Oncology

## 2014-03-11 VITALS — BP 150/72 | HR 84 | Temp 97.7°F | Ht 68.0 in | Wt 212.2 lb

## 2014-03-11 DIAGNOSIS — Z124 Encounter for screening for malignant neoplasm of cervix: Secondary | ICD-10-CM | POA: Insufficient documentation

## 2014-03-11 DIAGNOSIS — C549 Malignant neoplasm of corpus uteri, unspecified: Secondary | ICD-10-CM | POA: Diagnosis not present

## 2014-03-11 DIAGNOSIS — C541 Malignant neoplasm of endometrium: Secondary | ICD-10-CM

## 2014-03-11 NOTE — Progress Notes (Signed)
Radiation Oncology         [phone] ________________________________  Name: [name] MRN: [id number]  Date: 03/11/2014  DOB: 11-23-1924  Follow-Up Visit Note  CC: Gavin Pound, MD  Hart Rochester, MD  Diagnosis:   Stage I-A grade 2 endometrial adenocarcinoma of the uterus  Interval Since Last Radiation:  10  months,  she completed intracavitary brachytherapy treatments after her surgery.  Narrative:  The patient returns today for routine follow-up.  She is doing well this time. She denies any new medical issues since her last followup. She specifically denies any vaginal bleeding hematuria or rectal bleeding or pelvic pain.                              ALLERGIES:  has No Known Allergies.  Meds: Current Outpatient Prescriptions  Medication Sig Dispense Refill  . ALPRAZolam (XANAX) 0.5 MG tablet 3 (three) times daily as needed. As needed      . amiloride-hydrochlorothiazide (MODURETIC) 5-50 MG tablet One tablet by mouth once daily       . Cholecalciferol (VITAMIN D3) 1000 UNITS CAPS Take 1 capsule by mouth daily.        . Horse Chestnut 300 MG CAPS Take 1 capsule by mouth daily.      [name] ibuprofen (ADVIL,MOTRIN) 200 MG tablet Take 200 mg by mouth every 6 (six) hours as needed for pain.      [name] KLOR-CON M10 10 MEQ tablet One and 1/2 tablets by mouth once daily       . Magnesium 250 MG TABS Take 1 tablet by mouth daily.      . Multiple Vitamins-Minerals (CENTRUM SILVER PO) Take 1 tablet by mouth daily.        . Omega-3 Fatty Acids (FISH OIL) 1000 MG CAPS Take 1 capsule by mouth daily.        . traZODone (DESYREL) 50 MG tablet Take 25 mg by mouth at bedtime. TAKES 1/2 TABLET      . vitamin C (ASCORBIC ACID) 500 MG tablet Take 500 mg by mouth daily.       No current facility-administered medications for this encounter.    Physical Findings: The patient is in no acute distress. Patient is alert and oriented.  height is 5\' 8"  (1.727 m) and weight is 212 lb 3.2 oz  (96.253 kg). Her temperature is 97.7 F (36.5 C). Her blood pressure is 150/72 and her pulse is 84. [name]  No palpable supraclavicular or axillary adenopathy. The lungs are clear to auscultation. The heart has regular rhythm and rate.   the abdomen is soft and nontender with normal bowel sounds.  The inguinal areas are free of adenopathy. On pelvic examination the external genitalia are unremarkable. A speculum exam is performed. There are no mucosal lesions noted in the vaginal vault.. A Pap smear was obtained of the proximal vagina. On bimanual and rectovaginal examination there no pelvic masses appreciated.   Lab Findings: Lab Results  Component Value Date   WBC 11.9* 11/22/2012   HGB 12.5 11/22/2012   HCT 36.8 11/22/2012   MCV 85.2 11/22/2012   PLT 304 11/22/2012      Radiographic Findings: Dg Chest 2 View  02/14/2014   CLINICAL DATA:  History of endometrial cancer. No current complaints.  EXAM: CHEST  2 VIEW  COMPARISON:  DG CHEST 2 VIEW dated 11/04/2011; DG CHEST 2 VIEW dated 01/24/2006  FINDINGS: There is stable  cardiomegaly and chronic vascular congestion. The mediastinal contours are stable. The lungs are clear. There is no pleural effusion or pneumothorax. Postsurgical changes are noted related to prior left mastectomy and axillary node dissection. Mild degenerative changes in the spine are unchanged.  IMPRESSION: Stable chest with mild cardiomegaly status post left mastectomy. No evidence of metastatic disease or acute process.   Electronically Signed   By: Camie Patience M.D.   On: 02/14/2014 10:07    Impression:  No evidence for recurrence on clinical exam today, Pap smear pending  Plan:  When necessary followup in radiation oncology as the patient continues to follow closely with gynecologic oncology.  ____________________________________ Blair Promise, MD

## 2014-03-11 NOTE — Progress Notes (Signed)
[name] here for follow up after treatment for endometrial cancer.  She denies pain, bladder issues, diarrhea and rectal/vaginal bleeding. She reports occasional fatigue.

## 2014-03-13 ENCOUNTER — Telehealth: Payer: Self-pay | Admitting: Oncology

## 2014-03-13 NOTE — Telephone Encounter (Signed)
Called [name] and let her know that her pap smear results were good per Dr. Sondra Come.  [name] verbalized agreement.

## 2014-03-21 ENCOUNTER — Other Ambulatory Visit: Payer: Self-pay | Admitting: Obstetrics and Gynecology

## 2014-03-21 DIAGNOSIS — Z90710 Acquired absence of both cervix and uterus: Secondary | ICD-10-CM | POA: Diagnosis not present

## 2014-03-21 DIAGNOSIS — Z9010 Acquired absence of unspecified breast and nipple: Secondary | ICD-10-CM | POA: Diagnosis not present

## 2014-03-21 DIAGNOSIS — C549 Malignant neoplasm of corpus uteri, unspecified: Secondary | ICD-10-CM | POA: Diagnosis not present

## 2014-03-21 DIAGNOSIS — Z800 Family history of malignant neoplasm of digestive organs: Secondary | ICD-10-CM | POA: Diagnosis not present

## 2014-03-21 DIAGNOSIS — Z9079 Acquired absence of other genital organ(s): Secondary | ICD-10-CM | POA: Diagnosis not present

## 2014-03-21 DIAGNOSIS — Z9889 Other specified postprocedural states: Secondary | ICD-10-CM

## 2014-03-21 DIAGNOSIS — S8990XA Unspecified injury of unspecified lower leg, initial encounter: Secondary | ICD-10-CM | POA: Diagnosis not present

## 2014-03-21 DIAGNOSIS — Z791 Long term (current) use of non-steroidal anti-inflammatories (NSAID): Secondary | ICD-10-CM | POA: Diagnosis not present

## 2014-03-21 DIAGNOSIS — Z79899 Other long term (current) drug therapy: Secondary | ICD-10-CM | POA: Diagnosis not present

## 2014-03-21 DIAGNOSIS — Z823 Family history of stroke: Secondary | ICD-10-CM | POA: Diagnosis not present

## 2014-03-21 DIAGNOSIS — Z853 Personal history of malignant neoplasm of breast: Secondary | ICD-10-CM | POA: Diagnosis not present

## 2014-03-21 DIAGNOSIS — Z8249 Family history of ischemic heart disease and other diseases of the circulatory system: Secondary | ICD-10-CM | POA: Diagnosis not present

## 2014-03-21 DIAGNOSIS — Z803 Family history of malignant neoplasm of breast: Secondary | ICD-10-CM | POA: Diagnosis not present

## 2014-03-21 DIAGNOSIS — Z9012 Acquired absence of left breast and nipple: Secondary | ICD-10-CM

## 2014-04-01 ENCOUNTER — Ambulatory Visit
Admission: RE | Admit: 2014-04-01 | Discharge: 2014-04-01 | Disposition: A | Discharge status: Home / Self Care | Payer: Medicare Other | Source: Ambulatory Visit | Attending: Obstetrics and Gynecology | Admitting: Obstetrics and Gynecology

## 2014-04-01 DIAGNOSIS — Z853 Personal history of malignant neoplasm of breast: Secondary | ICD-10-CM | POA: Diagnosis not present

## 2014-04-01 DIAGNOSIS — Z9012 Acquired absence of left breast and nipple: Secondary | ICD-10-CM

## 2014-04-01 DIAGNOSIS — R928 Other abnormal and inconclusive findings on diagnostic imaging of breast: Secondary | ICD-10-CM | POA: Diagnosis not present

## 2014-04-01 DIAGNOSIS — Z9889 Other specified postprocedural states: Secondary | ICD-10-CM

## 2014-04-07 IMAGING — CR DG CHEST 2V
2 series · 2 of 2 positions shown · non-contrast
Comparison: DG CHEST 2 VIEW dated 11/04/2011; DG CHEST 2 VIEW dated
01/24/2006

CLINICAL DATA: History of endometrial cancer. No current
complaints.

EXAM:
CHEST  2 VIEW

[w chest pa]
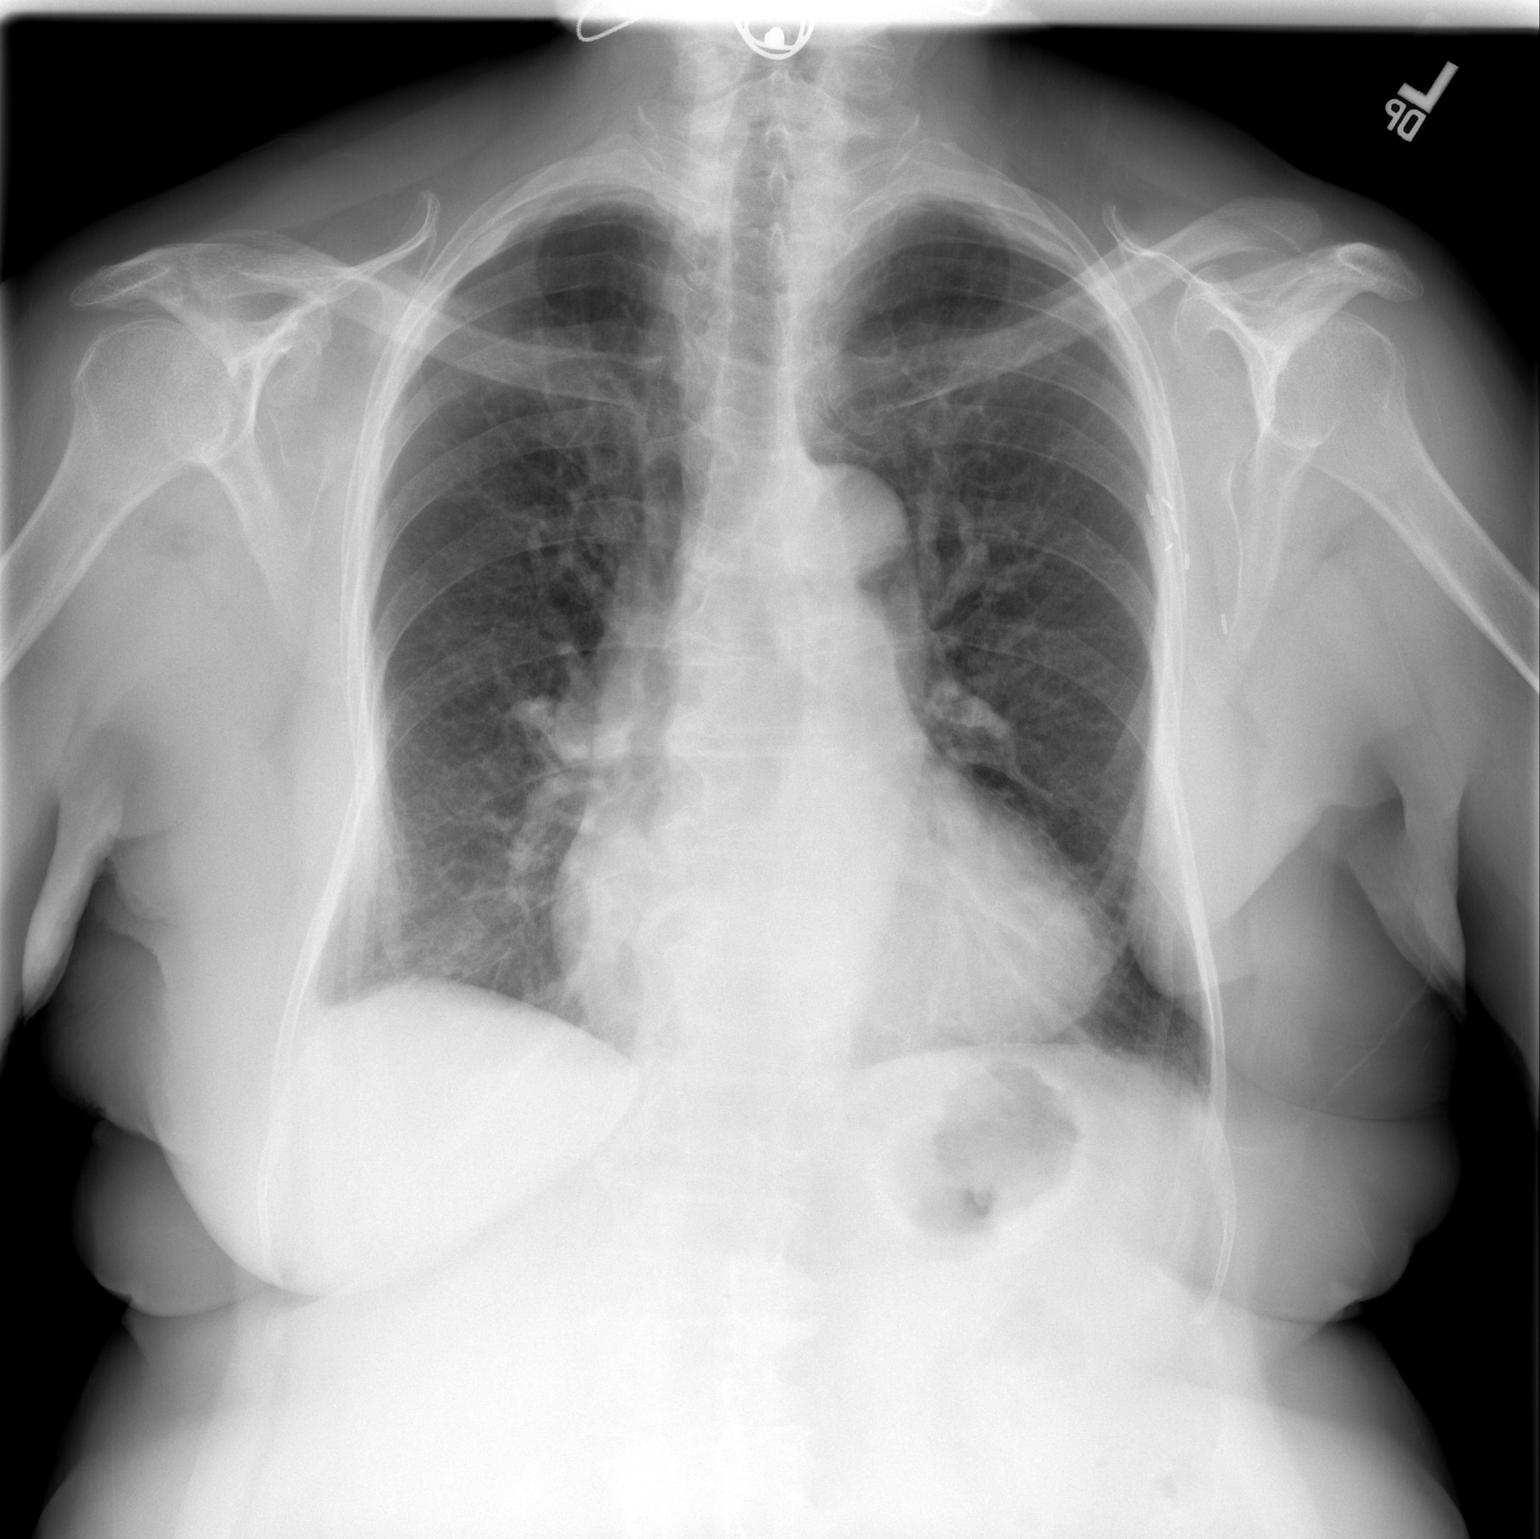

[w chest lat]
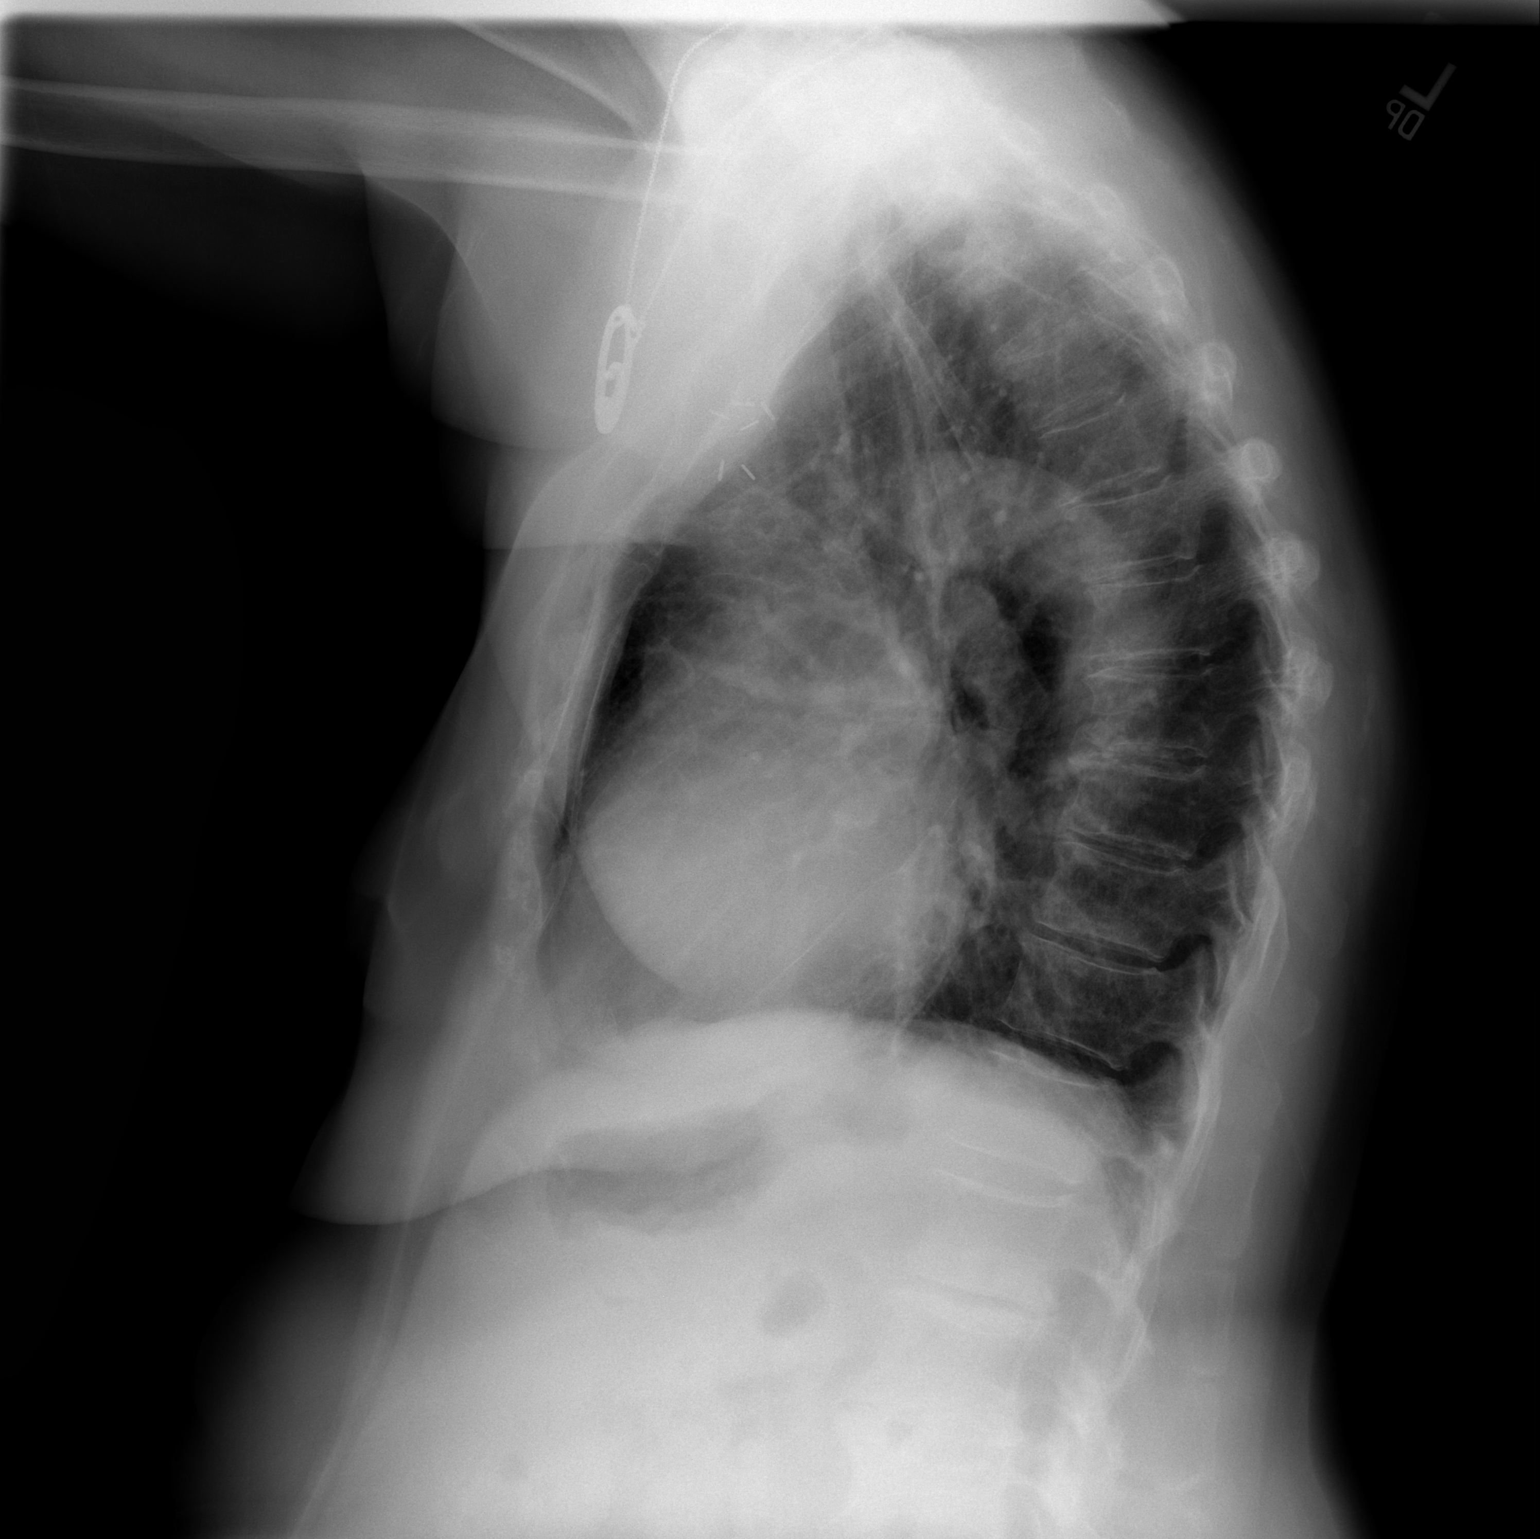

[2 of 2 positions shown; findings below may reference images not displayed]

FINDINGS: There is stable cardiomegaly and chronic vascular congestion. The
mediastinal contours are stable. The lungs are clear. There is no
pleural effusion or pneumothorax. Postsurgical changes are noted
related to prior left mastectomy and axillary node dissection. Mild
degenerative changes in the spine are unchanged.
IMPRESSION: Stable chest with mild cardiomegaly status post left mastectomy. No
evidence of metastatic disease or acute process.

## 2014-04-22 DIAGNOSIS — R197 Diarrhea, unspecified: Secondary | ICD-10-CM | POA: Diagnosis not present

## 2014-04-22 DIAGNOSIS — IMO0002 Reserved for concepts with insufficient information to code with codable children: Secondary | ICD-10-CM | POA: Diagnosis not present

## 2014-04-29 ENCOUNTER — Encounter (HOSPITAL_BASED_OUTPATIENT_CLINIC_OR_DEPARTMENT_OTHER): Payer: Medicare Other | Attending: Internal Medicine

## 2014-04-29 DIAGNOSIS — Z86718 Personal history of other venous thrombosis and embolism: Secondary | ICD-10-CM | POA: Diagnosis not present

## 2014-04-29 DIAGNOSIS — L97809 Non-pressure chronic ulcer of other part of unspecified lower leg with unspecified severity: Secondary | ICD-10-CM | POA: Diagnosis not present

## 2014-04-29 DIAGNOSIS — Z79899 Other long term (current) drug therapy: Secondary | ICD-10-CM | POA: Insufficient documentation

## 2014-04-29 DIAGNOSIS — Z8544 Personal history of malignant neoplasm of other female genital organs: Secondary | ICD-10-CM | POA: Diagnosis not present

## 2014-04-30 NOTE — Progress Notes (Signed)
[facility]  NAME:  [name], [name]           ACCOUNT NO.:  [id number]  MEDICAL RECORD NO.:  [id number]      DATE OF BIRTH:  1925-01-18  PHYSICIAN:  Irene Limbo, MD    VISIT DATE:  04/29/2014                                  OFFICE VISIT   CHIEF COMPLAINT:  Recurrent left lower extremity venous ulcerations.  HISTORY OF PRESENT ILLNESS:  The patient is 80 year old ambulatory female with past medical history significant for uterine cancer, history of DVT, that returns for approximately 1 month history of recurrent left lateral leg ulceration. Current wound care has been dry dressing.  She was most recently treated by Dr. Dellia Nims and discharged from clinic in January 2015.  She developed this ulceration while she was using her compression stockings.  She continues to use a compression stocking over the right lower extremity. Review of the chart indicates that she was seen by Dr. Jones Skene at the [facility] with evidence of postthrombotic obstruction of the deep system with superficial system reflux.  I do not have any reports of this visit.  Per the patient and her daughter, they were told that nothing could be done surgically for these findings.    We reviewed her past medical history and medication list.  There are no changes to her history.   Medications include Xanax, Moduretic, vitamin C, vitamin D3, Advil, magnesium, Klor-Con, and trazodone.  On examination, she is sensate over all tested points by Semmes-Weinstein test over the left lower extremity.  ABI is calculated at 1.52.  Review of the chart indicates previous ABI obtained in the [facility] system dated 2010 that showed normal ankle and toe pressures.  PHYSICAL EXAMINATION:  Blood pressure is 125/71, pulse 76, temperature is 98.1, weight is 212 pounds, height is 5 feet 6 inches.  Left calf is measured at 38 cm, left ankle is 23.5 cm.  There is open wound over the left distal lateral ankle  measured at 3.8 x 2.8 x 0.1 cm after application of topical anesthetic.  Selective debridement was performed with a curette to remove all the superficial slough present in the wound. The patient tolerated procedure well.  ASSESSMENT:  Recurrent left lower extremity venous ulceration.  We will plan to institute Unna boot with calcium alginate.  The patient is requesting to continue with Dr. Dellia Nims who she knows well. The family has also requested a RN visit for dressing changes.  They note there is significant odor mid-week without changing the dressings. We will arrange both of these and the patient will follow up in approximately 1- 1/2 weeks' time with Dr. Dellia Nims.          ______________________________ Irene Limbo, MD MBA     BT/MEDQ  D:  04/29/2014  T:  04/30/2014  Job:  [id number]

## 2014-05-09 DIAGNOSIS — L97809 Non-pressure chronic ulcer of other part of unspecified lower leg with unspecified severity: Secondary | ICD-10-CM | POA: Diagnosis not present

## 2014-05-09 DIAGNOSIS — Z86718 Personal history of other venous thrombosis and embolism: Secondary | ICD-10-CM | POA: Diagnosis not present

## 2014-05-09 DIAGNOSIS — Z8544 Personal history of malignant neoplasm of other female genital organs: Secondary | ICD-10-CM | POA: Diagnosis not present

## 2014-05-09 DIAGNOSIS — Z79899 Other long term (current) drug therapy: Secondary | ICD-10-CM | POA: Diagnosis not present

## 2014-05-16 DIAGNOSIS — L97809 Non-pressure chronic ulcer of other part of unspecified lower leg with unspecified severity: Secondary | ICD-10-CM | POA: Diagnosis not present

## 2014-05-16 DIAGNOSIS — Z79899 Other long term (current) drug therapy: Secondary | ICD-10-CM | POA: Diagnosis not present

## 2014-05-16 DIAGNOSIS — Z8544 Personal history of malignant neoplasm of other female genital organs: Secondary | ICD-10-CM | POA: Diagnosis not present

## 2014-05-16 DIAGNOSIS — Z86718 Personal history of other venous thrombosis and embolism: Secondary | ICD-10-CM | POA: Diagnosis not present

## 2014-05-23 ENCOUNTER — Encounter (HOSPITAL_BASED_OUTPATIENT_CLINIC_OR_DEPARTMENT_OTHER): Payer: Medicare Other | Attending: Internal Medicine

## 2014-05-23 DIAGNOSIS — I872 Venous insufficiency (chronic) (peripheral): Secondary | ICD-10-CM | POA: Diagnosis not present

## 2014-05-23 DIAGNOSIS — L97809 Non-pressure chronic ulcer of other part of unspecified lower leg with unspecified severity: Secondary | ICD-10-CM | POA: Insufficient documentation

## 2014-05-30 DIAGNOSIS — I872 Venous insufficiency (chronic) (peripheral): Secondary | ICD-10-CM | POA: Diagnosis not present

## 2014-06-06 DIAGNOSIS — L97809 Non-pressure chronic ulcer of other part of unspecified lower leg with unspecified severity: Secondary | ICD-10-CM | POA: Diagnosis not present

## 2014-06-06 DIAGNOSIS — I872 Venous insufficiency (chronic) (peripheral): Secondary | ICD-10-CM | POA: Diagnosis not present

## 2014-06-13 DIAGNOSIS — I872 Venous insufficiency (chronic) (peripheral): Secondary | ICD-10-CM | POA: Diagnosis not present

## 2014-06-17 DIAGNOSIS — R109 Unspecified abdominal pain: Secondary | ICD-10-CM | POA: Diagnosis not present

## 2014-06-17 DIAGNOSIS — K625 Hemorrhage of anus and rectum: Secondary | ICD-10-CM | POA: Diagnosis not present

## 2014-06-18 ENCOUNTER — Inpatient Hospital Stay (HOSPITAL_COMMUNITY)
Admission: EM | Admit: 2014-06-18 | Discharge: 2014-06-21 | DRG: 378 | Disposition: A | Discharge status: Home / Self Care | Payer: Medicare Other | Attending: Internal Medicine | Admitting: Internal Medicine

## 2014-06-18 ENCOUNTER — Encounter (HOSPITAL_COMMUNITY): Payer: Self-pay | Admitting: Emergency Medicine

## 2014-06-18 ENCOUNTER — Emergency Department (HOSPITAL_COMMUNITY): Payer: Medicare Other

## 2014-06-18 DIAGNOSIS — K625 Hemorrhage of anus and rectum: Secondary | ICD-10-CM | POA: Diagnosis present

## 2014-06-18 DIAGNOSIS — Z8542 Personal history of malignant neoplasm of other parts of uterus: Secondary | ICD-10-CM | POA: Diagnosis not present

## 2014-06-18 DIAGNOSIS — Z9010 Acquired absence of unspecified breast and nipple: Secondary | ICD-10-CM

## 2014-06-18 DIAGNOSIS — Z853 Personal history of malignant neoplasm of breast: Secondary | ICD-10-CM | POA: Diagnosis not present

## 2014-06-18 DIAGNOSIS — Z90710 Acquired absence of both cervix and uterus: Secondary | ICD-10-CM

## 2014-06-18 DIAGNOSIS — D62 Acute posthemorrhagic anemia: Secondary | ICD-10-CM | POA: Diagnosis present

## 2014-06-18 DIAGNOSIS — K5730 Diverticulosis of large intestine without perforation or abscess without bleeding: Secondary | ICD-10-CM | POA: Diagnosis not present

## 2014-06-18 DIAGNOSIS — Z800 Family history of malignant neoplasm of digestive organs: Secondary | ICD-10-CM | POA: Diagnosis not present

## 2014-06-18 DIAGNOSIS — Z86718 Personal history of other venous thrombosis and embolism: Secondary | ICD-10-CM

## 2014-06-18 DIAGNOSIS — I10 Essential (primary) hypertension: Secondary | ICD-10-CM

## 2014-06-18 DIAGNOSIS — K922 Gastrointestinal hemorrhage, unspecified: Secondary | ICD-10-CM

## 2014-06-18 DIAGNOSIS — Z8249 Family history of ischemic heart disease and other diseases of the circulatory system: Secondary | ICD-10-CM

## 2014-06-18 DIAGNOSIS — Z803 Family history of malignant neoplasm of breast: Secondary | ICD-10-CM | POA: Diagnosis not present

## 2014-06-18 DIAGNOSIS — K5731 Diverticulosis of large intestine without perforation or abscess with bleeding: Principal | ICD-10-CM | POA: Diagnosis present

## 2014-06-18 DIAGNOSIS — I872 Venous insufficiency (chronic) (peripheral): Secondary | ICD-10-CM | POA: Diagnosis present

## 2014-06-18 DIAGNOSIS — K8020 Calculus of gallbladder without cholecystitis without obstruction: Secondary | ICD-10-CM | POA: Diagnosis not present

## 2014-06-18 DIAGNOSIS — Z923 Personal history of irradiation: Secondary | ICD-10-CM

## 2014-06-18 HISTORY — DX: Diverticulosis of large intestine without perforation or abscess with bleeding: K57.31

## 2014-06-18 LAB — COMPREHENSIVE METABOLIC PANEL
ALK PHOS: 64 U/L (ref 39–117)
ALT: 9 U/L (ref 0–35)
AST: 14 U/L (ref 0–37)
Albumin: 3.4 g/dL — ABNORMAL LOW (ref 3.5–5.2)
Anion gap: 13 (ref 5–15)
BUN: 23 mg/dL (ref 6–23)
CALCIUM: 9.7 mg/dL (ref 8.4–10.5)
CO2: 25 meq/L (ref 19–32)
Chloride: 102 mEq/L (ref 96–112)
Creatinine, Ser: 1.04 mg/dL (ref 0.50–1.10)
GFR calc non Af Amer: 47 mL/min — ABNORMAL LOW (ref >90)
GFR, EST AFRICAN AMERICAN: 55 mL/min — AB (ref >90)
GLUCOSE: 85 mg/dL (ref 70–99)
POTASSIUM: 4.2 meq/L (ref 3.7–5.3)
SODIUM: 140 meq/L (ref 137–147)
TOTAL PROTEIN: 7 g/dL (ref 6.0–8.3)
Total Bilirubin: 0.2 mg/dL — ABNORMAL LOW (ref 0.3–1.2)

## 2014-06-18 LAB — CBC
HCT: 33.6 % — ABNORMAL LOW (ref 36.0–46.0)
HEMOGLOBIN: 10.9 g/dL — AB (ref 12.0–15.0)
MCH: 28.3 pg (ref 26.0–34.0)
MCHC: 32.4 g/dL (ref 30.0–36.0)
MCV: 87.3 fL (ref 78.0–100.0)
PLATELETS: 201 10*3/uL (ref 150–400)
RBC: 3.85 MIL/uL — AB (ref 3.87–5.11)
RDW: 15.6 % — ABNORMAL HIGH (ref 11.5–15.5)
WBC: 7.6 10*3/uL (ref 4.0–10.5)

## 2014-06-18 LAB — TYPE AND SCREEN
ABO/RH(D): A POS
Antibody Screen: NEGATIVE

## 2014-06-18 MED ORDER — ALPRAZOLAM 0.5 MG PO TABS: 0.5000 mg | ORAL_TABLET | Freq: Three times a day (TID) | ORAL | Status: DC | PRN

## 2014-06-18 MED ORDER — ACETAMINOPHEN 325 MG PO TABS
650.0000 mg | ORAL_TABLET | Freq: Four times a day (QID) | ORAL | Status: DC | PRN
  Administered 2014-06-19: 650 mg via ORAL
  Filled 2014-06-18: qty 2

## 2014-06-18 MED ORDER — ACETAMINOPHEN 650 MG RE SUPP: 650.0000 mg | Freq: Four times a day (QID) | RECTAL | Status: DC | PRN

## 2014-06-18 MED ORDER — TRIAMTERENE-HCTZ 37.5-25 MG PO TABS
1.0000 | ORAL_TABLET | Freq: Every day | ORAL | Status: DC
  Administered 2014-06-18 – 2014-06-21 (×4): 1 via ORAL
  Filled 2014-06-18 (×4): qty 1

## 2014-06-18 MED ORDER — ONDANSETRON HCL 4 MG/2ML IJ SOLN: 4.0000 mg | Freq: Four times a day (QID) | INTRAMUSCULAR | Status: DC | PRN

## 2014-06-18 MED ORDER — IOHEXOL 300 MG/ML  SOLN
100.0000 mL | Freq: Once | INTRAMUSCULAR | Status: AC | PRN
  Administered 2014-06-18: 100 mL via INTRAVENOUS

## 2014-06-18 MED ORDER — ONDANSETRON HCL 4 MG PO TABS: 4.0000 mg | ORAL_TABLET | Freq: Four times a day (QID) | ORAL | Status: DC | PRN

## 2014-06-18 MED ORDER — POLYETHYLENE GLYCOL 3350 17 G PO PACK
17.0000 g | PACK | Freq: Every day | ORAL | Status: DC | PRN
  Filled 2014-06-18: qty 1

## 2014-06-18 MED ORDER — IOHEXOL 300 MG/ML  SOLN
50.0000 mL | Freq: Once | INTRAMUSCULAR | Status: AC | PRN
  Administered 2014-06-18: 50 mL via ORAL

## 2014-06-18 MED ORDER — SODIUM CHLORIDE 0.9 % IV SOLN: 250.0000 mL | INTRAVENOUS | Status: DC | PRN

## 2014-06-18 MED ORDER — VITAMIN C 500 MG PO TABS
500.0000 mg | ORAL_TABLET | Freq: Every day | ORAL | Status: DC
  Administered 2014-06-18 – 2014-06-21 (×4): 500 mg via ORAL
  Filled 2014-06-18 (×4): qty 1

## 2014-06-18 MED ORDER — TRAZODONE 25 MG HALF TABLET
25.0000 mg | ORAL_TABLET | Freq: Every day | ORAL | Status: DC
  Administered 2014-06-18 – 2014-06-20 (×3): 25 mg via ORAL
  Filled 2014-06-18 (×4): qty 1

## 2014-06-18 MED ORDER — SODIUM CHLORIDE 0.9 % IJ SOLN
3.0000 mL | Freq: Two times a day (BID) | INTRAMUSCULAR | Status: DC
  Administered 2014-06-18 – 2014-06-21 (×6): 3 mL via INTRAVENOUS

## 2014-06-18 MED ORDER — SODIUM CHLORIDE 0.9 % IJ SOLN
3.0000 mL | INTRAMUSCULAR | Status: DC | PRN
  Administered 2014-06-19 – 2014-06-21 (×3): 3 mL via INTRAVENOUS

## 2014-06-18 MED ORDER — TRIAMTERENE-HCTZ 75-50 MG PO TABS
1.0000 | ORAL_TABLET | Freq: Every day | ORAL | Status: DC
  Filled 2014-06-18: qty 1

## 2014-06-18 MED ORDER — POTASSIUM CHLORIDE CRYS ER 10 MEQ PO TBCR
10.0000 meq | EXTENDED_RELEASE_TABLET | Freq: Every day | ORAL | Status: DC
  Administered 2014-06-18 – 2014-06-21 (×4): 10 meq via ORAL
  Filled 2014-06-18 (×4): qty 1

## 2014-06-18 NOTE — ED Provider Notes (Signed)
  Face-to-face evaluation   History: She began having rectal bleeding. Several days ago. It occurs both with bowel movements, and when urinating. She had a hysterectomy last year for uterine cancer. She previously had a diverticular bleed, treated symptomatically.  Physical exam: Alert, elderly, female, mildly uncomfortable. Abdomen soft. Mild tenderness left upper and lower quadrants. No rebound tenderness.  Medical screening examination/treatment/procedure(s) were conducted as a shared visit with non-physician practitioner(s) and myself.  I personally evaluated the patient during the encounter  Richarda Blade, MD 06/18/14 [phone]

## 2014-06-18 NOTE — ED Notes (Addendum)
Pt c/o rectal bleeding for a couple of days, had an episode like this last year diagnosed wit diverticulosis. Pt states blood varies in redness.  Pt also c/o lower abd pain and feeling weak

## 2014-06-18 NOTE — ED Provider Notes (Signed)
CSN: [id number]     Arrival date & time 06/18/14  1237 History   First MD Initiated Contact with Patient 06/18/14 1358     Chief Complaint  Patient presents with  . Rectal Bleeding     (Consider location/radiation/quality/duration/timing/severity/associated sxs/prior Treatment) Patient is a 80 y.o. female presenting with hematochezia. The history is provided by the patient.  Rectal Bleeding Quality:  Bright red Amount:  Copious Duration:  4 days Progression:  Worsening Context: defecation and diarrhea   Similar prior episodes: yes   Relieved by:  Nothing Worsened by:  Defecation Ineffective treatments:  None tried Associated symptoms: abdominal pain (mild cramping)   Associated symptoms: no dizziness, no hematemesis, no light-headedness, no loss of consciousness and no vomiting   Risk factors: NSAID use   Risk factors: no anticoagulant use   Pt is an 80 yo who presents to the ED with complaints of rectal bleeding.  She states her stools became looser about 1 month ago and she has had diarrhea since that time.  Admits to some dark stools during this time.  4 days ago she first noticed some minimal red blood in her stool.  Yesterday she passed a large amount of bright red blood per rectum.  She was seen at an urgent care center last night and told to follow up this morning.  She states she passed approximately another pint of blood this morning.  She returned to the urgent care center who directed her to the hospital.  18 months ago she had an episode of rectal bleeding and was diagnosed with diverticulosis at this time.  PMH also includes endometrial cancer, breast cancer, and DVT.  She has not been currently taking blood thinners.  Complains of mild abdominal pain across the LLQ and RLQ.  Denies N/V, fever, lightheadedness, or dizziness.      Past Medical History  Diagnosis Date  . Hypertension   . Iron deficiency   . Venous stasis     bilateral  . Anxiety   . Family history of  malignant neoplasm of gastrointestinal tract   . Hx of radiation therapy 02/2006 - 03/2006    right breast  . Breast cancer     left 1994, right 2007  . Diverticulosis   . DVT (deep venous thrombosis) 1992    right leg x 2  . Ulcers of both lower extremities     chronic  . Endometrial adenocarcinoma 12/25/12    biopsy  . Hx of radiation therapy 5/12, 5/19, 5/29, 6/2, 04/30/2013    proximal vagina, 30 Gy in 5 sessions, HDR   Past Surgical History  Procedure Laterality Date  . Mastectomy Left 1994    left , Tamoxifen x 17 yrs  . Partial mastectomy Right 2007    radiation  . Diverticulosis    . Abdominal hysterectomy  01/18/13    robotic, bso   Family History  Problem Relation Age of Onset  . Colon cancer Brother   . Cancer Brother     prostate  . Heart disease Brother   . Breast cancer Daughter   . Colon cancer Sister   . Heart disease Sister     MI  . Breast cancer Sister   . Anuerysm Sister   . Cancer Father     skin   History  Substance Use Topics  . Smoking status: Never Smoker   . Smokeless tobacco: Never Used  . Alcohol Use: No   OB History   Grav Para Term  Preterm Abortions TAB SAB Ect Mult Living                 Review of Systems  Gastrointestinal: Positive for abdominal pain (mild cramping) and hematochezia. Negative for vomiting and hematemesis.  Neurological: Negative for dizziness, loss of consciousness and light-headedness.  Hematological: Does not bruise/bleed easily.  All other systems reviewed and are negative.     Allergies  Review of patient's allergies indicates no known allergies.  Home Medications   Prior to Admission medications   Medication Sig Start Date End Date Taking? Authorizing Provider  ALPRAZolam Duanne Moron) 0.5 MG tablet Take 0.5 mg by mouth 3 (three) times daily as needed for anxiety. As needed 08/10/11  Yes Historical Provider, MD  amiloride-hydrochlorothiazide (MODURETIC) 5-50 MG tablet Take 0.5 tablets by mouth daily. One  tablet by mouth once daily 08/16/11  Yes Historical Provider, MD  Cholecalciferol (VITAMIN D3) 1000 UNITS CAPS Take 1,000 Units by mouth daily.    Yes Historical Provider, MD  Horse Chestnut 300 MG CAPS Take 1 capsule by mouth daily.   Yes Historical Provider, MD  KLOR-CON M10 10 MEQ tablet Take 10 mEq by mouth daily.  06/22/11  Yes Historical Provider, MD  Magnesium 250 MG TABS Take 1 tablet by mouth daily.   Yes Historical Provider, MD  Multiple Vitamins-Minerals (CENTRUM SILVER PO) Take 1 tablet by mouth daily.     Yes Historical Provider, MD  Omega-3 Fatty Acids (FISH OIL) 1000 MG CAPS Take 1 capsule by mouth daily.     Yes Historical Provider, MD  traZODone (DESYREL) 50 MG tablet Take 25 mg by mouth at bedtime. TAKES 1/2 TABLET   Yes Historical Provider, MD  vitamin C (ASCORBIC ACID) 500 MG tablet Take 500 mg by mouth daily.   Yes Historical Provider, MD   BP 151/76  Pulse 84  Temp(Src) 98.3 F (36.8 C) (Oral)  Resp 20  SpO2 98% Physical Exam  Vitals reviewed. Constitutional: She is oriented to person, place, and time. She appears well-developed and well-nourished. No distress.  HENT:  Head: Normocephalic and atraumatic.  Eyes: Conjunctivae are normal. No scleral icterus.  Neck: Normal range of motion.  Cardiovascular: Normal rate, regular rhythm and normal heart sounds.  Exam reveals no gallop and no friction rub.   No murmur heard. Pulmonary/Chest: Effort normal and breath sounds normal. No respiratory distress.  Abdominal: Soft. Bowel sounds are normal. She exhibits no distension and no mass. There is tenderness (minimal ttp lower abdomen). There is no guarding.  Genitourinary:  Digital Rectal Exam reveals sphincter with good tone. No external hemorrhoids. No masses or fissures. There is frank blood trickling from the rectum and covering the patient's cheeks  Neurological: She is alert and oriented to person, place, and time.  Skin: Skin is warm and dry. She is not diaphoretic.     ED Course  Procedures (including critical care time) Labs Review Labs Reviewed  CBC - Abnormal; Notable for the following:    RBC 3.85 (*)    Hemoglobin 10.9 (*)    HCT 33.6 (*)    RDW 15.6 (*)    All other components within normal limits  COMPREHENSIVE METABOLIC PANEL - Abnormal; Notable for the following:    Albumin 3.4 (*)    Total Bilirubin 0.2 (*)    GFR calc non Af Amer 47 (*)    GFR calc Af Amer 55 (*)    All other components within normal limits  OCCULT BLOOD X 1 CARD TO  LAB, STOOL  TYPE AND SCREEN    Imaging Review No results found.   EKG Interpretation None      MDM   Final diagnoses:  None   Patient with rectal bleed . Hemodynamically stable. Her hgb is trending down at her pcps office, appears wnl today.   Filed Vitals:   06/18/14 1249  BP: 151/76  Pulse: 84  Temp: 98.3 F (36.8 C)  TempSrc: Oral  Resp: 20  SpO2: 98%   I have spoken Dr. Aileen Fass who will admit the patient to observation.  Margarita Mail, PA-C 06/18/14 1533

## 2014-06-18 NOTE — ED Notes (Signed)
Pt in bathroom when this RN attempted to obtain vitals.

## 2014-06-18 NOTE — H&P (Signed)
[facility] History and Physical  [name] VVO:[id number] DOB: July 14, 1925 DOA: 06/18/2014  Referring physician: Dr. Eulis Foster PCP: Gavin Pound, MD   Chief Complaint: rectal bleeding  HPI: [name] is a 80 y.o. female  With past medical history of endometrial cancer status post hysterectomy and radiation to her pelvis last one in June of 2014, also history ductal carcinoma status post excision in 2007, has never had a colonoscopy, hypertension that comes in for rectal bleeding that started on 5 days prior to admission. She relates at the beginning it was black and very small amount it has progressively gotten worse to the point where she doesn't even need to have a bowel movement to see blood coming out. She is asymptomatic, a rectal exam done by the ED physician showed bright blood. Baseline hemoglobin is around 12 on the day of admission is 10.9.  In the ED: Rectal exam was done that shows progressive blood per rectum.   Review of Systems:  Constitutional:  No weight loss, night sweats, Fevers, chills, fatigue.  HEENT:  No headaches, Difficulty swallowing,Tooth/dental problems,Sore throat,  No sneezing, itching, ear ache, nasal congestion, post nasal drip,  Cardio-vascular:  No chest pain, Orthopnea, PND, swelling in lower extremities, anasarca, dizziness, palpitations  GI:  No heartburn, indigestion, abdominal pain, nausea, vomiting, diarrhea, change in bowel habits, loss of appetite  Resp:  No shortness of breath with exertion or at rest. No excess mucus, no productive cough, No non-productive cough, No coughing up of blood.No change in color of mucus.No wheezing.No chest wall deformity  Skin:  no rash or lesions.  GU:  no dysuria, change in color of urine, no urgency or frequency. No flank pain.  Musculoskeletal:  No joint pain or swelling. No decreased range of motion. No back pain.  Psych:  No change in mood or affect. No depression or anxiety. No memory  loss.   Past Medical History  Diagnosis Date  . Hypertension   . Iron deficiency   . Venous stasis     bilateral  . Anxiety   . Family history of malignant neoplasm of gastrointestinal tract   . Hx of radiation therapy 02/2006 - 03/2006    right breast  . Breast cancer     left 1994, right 2007  . Diverticulosis   . DVT (deep venous thrombosis) 1992    right leg x 2  . Ulcers of both lower extremities     chronic  . Endometrial adenocarcinoma 12/25/12    biopsy  . Hx of radiation therapy 5/12, 5/19, 5/29, 6/2, 04/30/2013    proximal vagina, 30 Gy in 5 sessions, HDR   Past Surgical History  Procedure Laterality Date  . Mastectomy Left 1994    left , Tamoxifen x 17 yrs  . Partial mastectomy Right 2007    radiation  . Diverticulosis    . Abdominal hysterectomy  01/18/13    robotic, bso   Social History:  reports that she has never smoked. She has never used smokeless tobacco. She reports that she does not drink alcohol or use illicit drugs.  No Known Allergies  Family History  Problem Relation Age of Onset  . Colon cancer Brother   . Cancer Brother     prostate  . Heart disease Brother   . Breast cancer Daughter   . Colon cancer Sister   . Heart disease Sister     MI  . Breast cancer Sister   . Anuerysm Sister   . Cancer  Father     skin     Prior to Admission medications   Medication Sig Start Date End Date Taking? Authorizing Provider  ALPRAZolam Duanne Moron) 0.5 MG tablet Take 0.5 mg by mouth 3 (three) times daily as needed for anxiety. As needed 08/10/11  Yes Historical Provider, MD  amiloride-hydrochlorothiazide (MODURETIC) 5-50 MG tablet Take 0.5 tablets by mouth daily. One tablet by mouth once daily 08/16/11  Yes Historical Provider, MD  Cholecalciferol (VITAMIN D3) 1000 UNITS CAPS Take 1,000 Units by mouth daily.    Yes Historical Provider, MD  Horse Chestnut 300 MG CAPS Take 1 capsule by mouth daily.   Yes Historical Provider, MD  KLOR-CON M10 10 MEQ tablet Take 10  mEq by mouth daily.  06/22/11  Yes Historical Provider, MD  Magnesium 250 MG TABS Take 1 tablet by mouth daily.   Yes Historical Provider, MD  Multiple Vitamins-Minerals (CENTRUM SILVER PO) Take 1 tablet by mouth daily.     Yes Historical Provider, MD  Omega-3 Fatty Acids (FISH OIL) 1000 MG CAPS Take 1 capsule by mouth daily.     Yes Historical Provider, MD  traZODone (DESYREL) 50 MG tablet Take 25 mg by mouth at bedtime. TAKES 1/2 TABLET   Yes Historical Provider, MD  vitamin C (ASCORBIC ACID) 500 MG tablet Take 500 mg by mouth daily.   Yes Historical Provider, MD   Physical Exam: Filed Vitals:   06/18/14 1515 06/18/14 1530 06/18/14 1545 06/18/14 1600  BP:      Pulse:      Temp:      TempSrc:      Resp: 15 17 17 22   SpO2:        Wt Readings from Last 3 Encounters:  03/11/14 96.253 kg (212 lb 3.2 oz)  11/06/13 95.391 kg (210 lb 4.8 oz)  06/04/13 92.897 kg (204 lb 12.8 oz)    General:  Appears calm and comfortable Eyes: PERRL, normal lids, irises & conjunctiva ENT: grossly normal hearing, lips & tongue Neck: no LAD, masses or thyromegaly Cardiovascular: RRR, no m/r/g. No LE edema. Respiratory: CTA bilaterally, no w/r/r. Normal respiratory effort. Abdomen: soft, some mild tenderness in the right lower quadrant no rebound or guarding. Skin: no rash or induration seen on limited exam Musculoskeletal: grossly normal tone BUE/BLE Psychiatric: grossly normal mood and affect, speech fluent and appropriate Neurologic: grossly non-focal.          Labs on Admission:  Basic Metabolic Panel:  Recent Labs Lab 06/18/14 1314  NA 140  K 4.2  CL 102  CO2 25  GLUCOSE 85  BUN 23  CREATININE 1.04  CALCIUM 9.7   Liver Function Tests:  Recent Labs Lab 06/18/14 1314  AST 14  ALT 9  ALKPHOS 64  BILITOT 0.2*  PROT 7.0  ALBUMIN 3.4*   No results found for this basename: LIPASE, AMYLASE,  in the last 168 hours No results found for this basename: AMMONIA,  in the last 168  hours CBC:  Recent Labs Lab 06/18/14 1314  WBC 7.6  HGB 10.9*  HCT 33.6*  MCV 87.3  PLT 201   Cardiac Enzymes: No results found for this basename: CKTOTAL, CKMB, CKMBINDEX, TROPONINI,  in the last 168 hours  BNP (last 3 results) No results found for this basename: PROBNP,  in the last 8760 hours CBG: No results found for this basename: GLUCAP,  in the last 168 hours  Radiological Exams on Admission: No results found.  EKG: Independently reviewed. none  Assessment/Plan  Active Problems:   Hx of radiation therapy   Rectal bleed   Essential hypertension  - She is at high risk for malignancies, she's had 2 previous cancers. As per family members she's had a brother with colon cancer at the age of 46. At this time she is asymptomatic, her hemoglobin dropped from 12-10.9. She has never had a colonoscopy. We'll check a CBC in the morning. We'll type and cross place 2 large-bore diabetes. Admit her to a MedSurg unit and consult to UGI. Place her n.p.o. after midnight for possible colonoscopy.  - Continue blood pressure medication currently well controlled.   Code Status: full DVT Prophylaxis:SCD's Family Communication: daughter Disposition Plan: inpatient   Time spent: 0505  Charlynne Cousins [facility] Pager [phone]  **Disclaimer: This note may have been dictated with voice recognition software. Similar sounding words can inadvertently be transcribed and this note may contain transcription errors which may not have been corrected upon publication of note.**

## 2014-06-18 NOTE — ED Notes (Signed)
MD at bedside. 

## 2014-06-19 ENCOUNTER — Encounter (HOSPITAL_COMMUNITY): Payer: Self-pay | Admitting: Internal Medicine

## 2014-06-19 DIAGNOSIS — K5731 Diverticulosis of large intestine without perforation or abscess with bleeding: Principal | ICD-10-CM

## 2014-06-19 HISTORY — DX: Diverticulosis of large intestine without perforation or abscess with bleeding: K57.31

## 2014-06-19 LAB — CBC
HCT: 29.2 % — ABNORMAL LOW (ref 36.0–46.0)
HCT: 29.9 % — ABNORMAL LOW (ref 36.0–46.0)
HEMOGLOBIN: 10 g/dL — AB (ref 12.0–15.0)
Hemoglobin: 9.4 g/dL — ABNORMAL LOW (ref 12.0–15.0)
MCH: 27.8 pg (ref 26.0–34.0)
MCH: 28.5 pg (ref 26.0–34.0)
MCHC: 32.2 g/dL (ref 30.0–36.0)
MCHC: 33.4 g/dL (ref 30.0–36.0)
MCV: 86.4 fL (ref 78.0–100.0)
MCV: 85.2 fL (ref 78.0–100.0)
PLATELETS: 210 10*3/uL (ref 150–400)
PLATELETS: 245 10*3/uL (ref 150–400)
RBC: 3.38 MIL/uL — ABNORMAL LOW (ref 3.87–5.11)
RBC: 3.51 MIL/uL — AB (ref 3.87–5.11)
RDW: 15.5 % (ref 11.5–15.5)
RDW: 15.6 % — ABNORMAL HIGH (ref 11.5–15.5)
WBC: 7.3 10*3/uL (ref 4.0–10.5)
WBC: 7.8 10*3/uL (ref 4.0–10.5)

## 2014-06-19 NOTE — Consult Note (Signed)
Date: 06/19/2014               Patient Name:  [name] MRN: [id number]  DOB: 07-Nov-1925 Age / Sex: 80 y.o., female   PCP: [name] Pound, MD         Requesting Physician: Dr. Charlynne Cousins, MD    Consulting Reason:  GI bleed     Chief Complaint: blood per rectum  History of Present Illness: [name] is an 80 year old woman with history of diverticulosis, endometrial CA s/p radiation presenting with blood per rectum x 5 days. On Friday, she noted some melenic stools. On Tuesday, she noticed red blood. Her last BM was last night and appeared to have blood clots. She reports lower abdominal cramping pain that is intermittent, relieved by BM. Denies nausea, vomiting. Previous episode of bleeding January that resolved spontaneously.  She had a barium enema 11/29/2012 which demonstrated multiple diverticula throughout the colon diffusely, concentrated primarily within the rectosigmoid and distal descending colon. No persistent polypoid lesion or constricting lesion.  She has never had a colonoscopy in the past. Takes Advil and stopped on Monday.   Meds: Current Facility-Administered Medications  Medication Dose Route Frequency Provider Last Rate Last Dose  . 0.9 %  sodium chloride infusion  250 mL Intravenous PRN [name] Cousins, MD      . acetaminophen (TYLENOL) tablet 650 mg  650 mg Oral Q6H PRN [name] Cousins, MD       Or  . acetaminophen (TYLENOL) suppository 650 mg  650 mg Rectal Q6H PRN [name] Cousins, MD      . ALPRAZolam [name]) tablet 0.5 mg  0.5 mg Oral TID PRN [name] Cousins, MD      . ondansetron [facility]) tablet 4 mg  4 mg Oral Q6H PRN [name] Cousins, MD       Or  . ondansetron [facility]) injection 4 mg  4 mg Intravenous Q6H PRN [name] Cousins, MD      . polyethylene glycol [facility] Floria Raveling) packet 17 g  17 g Oral Daily PRN [name] Cousins, MD      . potassium chloride (K-DUR,KLOR-CON) CR tablet 10 mEq  10 mEq Oral Daily [name] Cousins, MD   10 mEq at 06/19/14 0942  . sodium chloride 0.9 % injection 3 mL  3 mL Intravenous Q12H [name] Cousins, MD   3 mL at 06/19/14 0944  . sodium chloride 0.9 % injection 3 mL  3 mL Intravenous PRN [name] Cousins, MD   3 mL at 06/19/14 0944  . traZODone (DESYREL) tablet 25 mg  25 mg Oral QHS [name] Cousins, MD   25 mg at 06/18/14 2107  . triamterene-hydrochlorothiazide (MAXZIDE-25) 37.5-25 MG per tablet 1 tablet  1 tablet Oral Daily [name] Cousins, MD   1 tablet at 06/19/14 [phone]  . vitamin C (ASCORBIC ACID) tablet 500 mg  500 mg Oral Daily [name] Cousins, MD   500 mg at 06/19/14 1829    Allergies: Allergies as of 06/18/2014  . (No Known Allergies)   Past Medical History  Diagnosis Date  . Hypertension   . Iron deficiency   . Venous stasis     bilateral  . Anxiety   . Family history of malignant neoplasm of gastrointestinal tract   . Hx of radiation therapy 02/2006 - 03/2006    right breast  . Breast cancer     left 1994, right 2007  . Diverticulosis   .  DVT (deep venous thrombosis) 1992    right leg x 2  . Ulcers of both lower extremities     chronic  . Endometrial adenocarcinoma 12/25/12    biopsy  . Hx of radiation therapy 5/12, 5/19, 5/29, 6/2, 04/30/2013    proximal vagina, 30 Gy in 5 sessions, HDR  . Diverticulosis of colon with hemorrhage 06/19/2014   Past Surgical History  Procedure Laterality Date  . Mastectomy Left 1994    left , Tamoxifen x 17 yrs  . Partial mastectomy Right 2007    radiation  . Diverticulosis    . Abdominal hysterectomy  01/18/13    robotic, bso   Family History  Problem Relation Age of Onset  . Colon cancer Brother   . Cancer Brother     prostate  . Heart disease Brother   . Breast cancer Daughter   . Colon cancer Sister   . Heart disease Sister     MI  . Breast cancer Sister   . Anuerysm Sister   . Cancer Father     skin   History   Social History  . Marital Status: Widowed    Spouse Name: N/A     Number of Children: 6  . Years of Education: N/A   Occupational History  . Retired     Social History Main Topics  . Smoking status: Never Smoker   . Smokeless tobacco: Never Used  . Alcohol Use: No  . Drug Use: No  . Sexual Activity: No   Other Topics Concern  . Not on file   Social History Narrative   2 caffeine drinks daily     Review of Systems: Constitutional: no fevers/chills Eyes: no vision changes Ears, nose, mouth, throat, and face: no cough Respiratory: no shortness of breath Cardiovascular: no chest pain Gastrointestinal: no constipation, rest per HPI Genitourinary: no dysuria, no hematuria Integument: no rash Hematologic/lymphatic: no edema Musculoskeletal: no arthralgias, no myalgias Neurological: no paresthesias, no weakness, +LH last night   Physical Exam: Blood pressure 133/70, pulse 83, temperature 98.2 F (36.8 C), temperature source Oral, resp. rate 16, height 5\' 6"  (1.676 m), weight 212 lb (96.163 kg), SpO2 97.00%. General: NAD HEENT: [place]/AT, MMM, anicteric sclera Neck: supple, trachea midline CV: RRR, no m/r/g Respiratory: CTAB, no wheezes Abdomen: soft, mild tenderness in lower abdomen R>L, nondistended, no rebound/guarding Skin: no rash Extremities: no edema Psych: appropriate mood and affect Neuro: no gross deficits   Lab results: Basic Metabolic Panel:  Recent Labs  06/18/14 1314  NA 140  K 4.2  CL 102  CO2 25  GLUCOSE 85  BUN 23  CREATININE 1.04  CALCIUM 9.7   Liver Function Tests:  Recent Labs  06/18/14 1314  AST 14  ALT 9  ALKPHOS 64  BILITOT 0.2*  PROT 7.0  ALBUMIN 3.4*   CBC:  Recent Labs  06/18/14 1314 06/19/14 0415  WBC 7.6 7.3  HGB 10.9* 9.4*  HCT 33.6* 29.2*  MCV 87.3 86.4  PLT 201 210    Imaging results:  Ct Abdomen Pelvis W Contrast  06/18/2014   CLINICAL DATA:  Rectal bleeding.  History of endometrial cancer.  EXAM: CT ABDOMEN AND PELVIS WITH CONTRAST  TECHNIQUE: Multidetector CT imaging of  the abdomen and pelvis was performed using the standard protocol following bolus administration of intravenous contrast.  CONTRAST:  13mL OMNIPAQUE IOHEXOL 300 MG/ML  SOLN  COMPARISON:  CT scan dated 11/22/2012  FINDINGS: There is a small hiatal hernia, unchanged. There is  a solitary 14 mm gallstone. Liver parenchyma and bile ducts are normal. Spleen, pancreas, and adrenal glands are normal. There are multiple bilateral renal cysts, unchanged. Both kidneys are lobulated, stable.  There are multiple diverticula throughout the colon. The small bowel is normal including the terminal ileum. Appendix is normal.  There is no adenopathy or free air free fluid. Uterus and ovaries have been removed. No significant acute osseous abnormality. Fairly severe degenerative disc disease at L1-2 and to a lesser degree L4-5.  IMPRESSION: Extensive diverticulosis throughout the colon.  Cholelithiasis.   Electronically Signed   By: Rozetta Nunnery M.D.   On: 06/18/2014 18:48    Assessment, Plan, & Recommendations: 80 year old woman with history of diverticulosis, endometrial CA s/p radiation presenting with blood per rectum x 5 days. Hemodynamically stable. Hgb 10.9 to 9.4. Likely diverticular bleed. Could also be radiation proctitis with history of radiation. Less likely malignancy.  Recommendations: -Continue supportive care -Follow H/H, transfuse prn -May have clear liquid diet -If bleeding worsens will consider tagged RBC nuclear scan.  Patient seen and discussed with attending physician, Dr. Penelope Coop  Signed: Jacques Earthly, MD 06/19/2014, 10:23 AM

## 2014-06-19 NOTE — Progress Notes (Signed)
Progress Note   [name] TIR:[id number] DOB: [birth year] DOA: 06/18/2014 PCP: Gavin Pound, MD   Brief Narrative:   [name] is an 80 y.o. female with a PMH of endometrial cancer status post hysterectomy/radiation therapy to the pelvis completed 04/2013, ductal carcinoma of the right breast status post partial mastectomy 01/2006 and radiation therapy, invasive breast cancer of the left breast status post left mastectomy in 1990, history of recurrent DVT, venous ulcers who was admitted 06/18/14 with a chief complaint of rectal bleeding. Upon evaluation in the ED, the patient's hemoglobin was found to be 10.9 (baseline around 12) and she was found to have Hemoccult-positive stool on rectal exam.  Assessment/Plan:   Principal Problem:   Acute lower GI bleeding / rectal bleed / acute blood loss anemia / diverticulosis with hemorrhage  Admitted and placed on bowel rest.  CT abdomen and pelvis shows extensive diverticulosis, so this is likely a diverticular bleed.  GI consult pending.  Hemoglobin drop of 1.5 g noted over the past 24 hours. Continue serial hemoglobin checks. No current indication for transfusion, but low threshold to transfuse if hemoglobin continues to drop orders ongoing evidence of significant blood loss.  Active Problems:   Hx of radiation therapy    Essential hypertension  Being managed with amiloride-hydrochlorothiazide.    DVT Prophylaxis  SCDs ordered.  Code Status: Full. Family Communication: Family updated at bedside. Disposition Plan: Home when stable.   IV Access:    Peripheral IV   Procedures and diagnostic studies:    06/18/14: CT abdomen and pelvis: Extensive diverticulosis throughout the colon. Cholelithiasis.   Medical Consultants:    Dr. Anson Fret, Gastroenterology   Other Consultants:    None.   Anti-Infectives:    None.  Subjective:    [name] had some bloody stools through the night, but  no further dizziness. No complaints of significant abdominal pain except for occasional transient crampy sensation to her lower abdomen. No nausea or vomiting.  Objective:    Filed Vitals:   06/18/14 1628 06/18/14 1809 06/18/14 2205 06/19/14 0720  BP: 181/78 133/77 146/55 133/70  Pulse: 88 83 84 83  Temp: 98.1 F (36.7 C) 97.9 F (36.6 C) 98.2 F (36.8 C) 98.2 F (36.8 C)  TempSrc: Oral Oral Oral Oral  Resp:  20 20 16   Height:  5\' 6"  (1.676 m)    Weight:  96.163 kg (212 lb)    SpO2: 98% 97% 98% 97%    Intake/Output Summary (Last 24 hours) at 06/19/14 6761 Last data filed at 06/18/14 1900  Gross per 24 hour  Intake    360 ml  Output      0 ml  Net    360 ml    Exam: Gen:  NAD Cardiovascular:  RRR, No M/R/G Respiratory:  Lungs CTAB Gastrointestinal:  Abdomen soft, NT/ND, + BS Extremities:  No C/E/C   Data Reviewed:    Labs: Basic Metabolic Panel:  Recent Labs Lab 06/18/14 1314  NA 140  K 4.2  CL 102  CO2 25  GLUCOSE 85  BUN 23  CREATININE 1.04  CALCIUM 9.7   GFR Estimated Creatinine Clearance: 45.4 ml/min (by C-G formula based on Cr of 1.04). Liver Function Tests:  Recent Labs Lab 06/18/14 1314  AST 14  ALT 9  ALKPHOS 64  BILITOT 0.2*  PROT 7.0  ALBUMIN 3.4*   CBC:  Recent Labs Lab 06/18/14 1314 06/19/14 0415  WBC 7.6 7.3  HGB 10.9* 9.4*  HCT 33.6* 29.2*  MCV 87.3 86.4  PLT 201 210   Microbiology No results found for this or any previous visit (from the past 240 hour(s)).   Medications:   . potassium chloride  10 mEq Oral Daily  . sodium chloride  3 mL Intravenous Q12H  . traZODone  25 mg Oral QHS  . triamterene-hydrochlorothiazide  1 tablet Oral Daily  . vitamin C  500 mg Oral Daily   Continuous Infusions:   Time spent: 35 minutes with > 50% of time discussing current diagnostic test results, clinical impression and plan of care.    LOS: 1 day   RAMA,CHRISTINA  [facility] Pager [phone]. If unable to reach me  by pager, please call my cell phone at [phone].  *Please refer to [email or web address], password [email or web address] to get updated schedule on who will round on this patient, as hospitalists switch teams weekly. If 7PM-7AM, please contact night-coverage at [email or web address], password [email or web address] for any overnight needs.  06/19/2014, 8:07 AM    **Disclaimer: This note was dictated with voice recognition software. Similar sounding words can inadvertently be transcribed and this note may contain transcription errors which may not have been corrected upon publication of note.**

## 2014-06-19 NOTE — Consult Note (Signed)
I have seen and interviewed the patient and examined her. I agree with Dr Marjory Sneddon evaluation and plan

## 2014-06-20 DIAGNOSIS — Z923 Personal history of irradiation: Secondary | ICD-10-CM

## 2014-06-20 LAB — HEMOGLOBIN AND HEMATOCRIT, BLOOD
HEMATOCRIT: 32.3 % — AB (ref 36.0–46.0)
Hemoglobin: 10.9 g/dL — ABNORMAL LOW (ref 12.0–15.0)

## 2014-06-20 LAB — CBC
HEMATOCRIT: 29.8 % — AB (ref 36.0–46.0)
HEMOGLOBIN: 10 g/dL — AB (ref 12.0–15.0)
MCH: 28.6 pg (ref 26.0–34.0)
MCHC: 33.6 g/dL (ref 30.0–36.0)
MCV: 85.1 fL (ref 78.0–100.0)
Platelets: 231 10*3/uL (ref 150–400)
RBC: 3.5 MIL/uL — ABNORMAL LOW (ref 3.87–5.11)
RDW: 15.6 % — AB (ref 11.5–15.5)
WBC: 5.9 10*3/uL (ref 4.0–10.5)

## 2014-06-20 NOTE — Consult Note (Signed)
WOC wound consult note Reason for Consult:Contiunued care of left lower LE (Patient is seen at outpatient wound care center Wound type:Venous insufficiency Pressure Ulcer POA: Yes/No Measurement: 8cm x 4.5cm.  Unable to assess depth due to the presence of a non-removable dressing. Wound LPF:XTKW through adaptic: 75% clean, pink, granulating Drainage (amount, consistency, odor) None Periwound:intact. Dressing procedure/placement/frequency: RN to remove Unna';s boot and leave the steri-stripped adaptic (non-adhesive dressing) in place this week.  Outpatient [facility] will replace graft next week (August 6th appointment).  [facility] tech will apply Unna's boot today and top with 4-inch Coban. [facility] nursing team will not follow, but will remain available to this patient, the nursing and medical team.  Please re-consult if needed. Thanks, Maudie Flakes, MSN, RN, [place], [place], [place] [phone])

## 2014-06-20 NOTE — Evaluation (Signed)
Physical Therapy One Time Evaluation Patient Details Name: [name] MRN: [id number] DOB: 22-Apr-1925 Today's Date: 06/20/2014   History of Present Illness  Pt is an 80 year old female admitted with rectal bleeding most likely a diverticular origin, also with venous insufficiency wound L LE and PMH of endometrial cancer s/p hysterectomy and breast cancer  Clinical Impression  Patient evaluated by Physical Therapy with no further acute PT needs identified. All education has been completed and the patient has no further questions. Pt up in bathroom with daughter on arrival and agreeable to ambulate in hallway.  Pt reports mobility at baseline and no unsteady gait observed. See below for any follow-up Physial Therapy or equipment needs. PT is signing off. Thank you for this referral.     Follow Up Recommendations No PT follow up    Equipment Recommendations  None recommended by PT    Recommendations for Other Services       Precautions / Restrictions Precautions Precautions: None      Mobility  Bed Mobility Overal bed mobility: Modified Independent                Transfers Overall transfer level: Modified independent                  Ambulation/Gait Ambulation/Gait assistance: Supervision Ambulation Distance (Feet): 400 Feet Assistive device: None Gait Pattern/deviations: Wide base of support     General Gait Details: no unsteadiness or LOB observed, increased bil sway, pt reports she will use assistive device if needed upon d/c (has RW and SPC)  Stairs            Wheelchair Mobility    Modified Rankin (Stroke Patients Only)       Balance Overall balance assessment:  (pt reports no hx of falls)                                           Pertinent Vitals/Pain No c/o pain    Home Living Family/patient expects to be discharged to:: Private residence Living Arrangements: Children Available Help at Discharge: Family Type  of Home: House       Home Layout: One level Home Equipment: Environmental consultant - 2 wheels;Cane - single point      Prior Function Level of Independence: Independent         Comments: reports using SPC occasionally in community     Hand Dominance        Extremity/Trunk Assessment               Lower Extremity Assessment: Overall WFL for tasks assessed      Cervical / Trunk Assessment: Normal  Communication   Communication: No difficulties  Cognition Arousal/Alertness: Awake/alert Behavior During Therapy: WFL for tasks assessed/performed Overall Cognitive Status: Within Functional Limits for tasks assessed                      General Comments      Exercises        Assessment/Plan    PT Assessment Patent does not need any further PT services  PT Diagnosis     PT Problem List    PT Treatment Interventions     PT Goals (Current goals can be found in the Care Plan section) Acute Rehab PT Goals PT Goal Formulation: No goals set, d/c therapy    Frequency  Barriers to discharge        Co-evaluation               End of Session   Activity Tolerance: Patient tolerated treatment well Patient left: in bed;with call bell/phone within reach;with family/visitor present           Time: 9292-4462 PT Time Calculation (min): 9 min   Charges:   PT Evaluation $Initial PT Evaluation Tier I: 1 Procedure PT Treatments $Gait Training: 8-22 mins   PT G Codes:          Dariyon Urquilla,KATHrine E 06/20/2014, 2:49 PM Carmelia Bake, PT, DPT 06/20/2014 Pager: [phone]

## 2014-06-20 NOTE — Progress Notes (Signed)
Progress Note   [name] LZJ:[id number] DOB: June 19, 1925 DOA: 06/18/2014 PCP: Gavin Pound, MD   Brief Narrative:   [name] is an 80 y.o. female with a PMH of endometrial cancer status post hysterectomy/radiation therapy to the pelvis completed 04/2013, ductal carcinoma of the right breast status post partial mastectomy 01/2006 and radiation therapy, invasive breast cancer of the left breast status post left mastectomy in 1990, history of recurrent DVT, venous ulcers who was admitted 06/18/14 with a chief complaint of rectal bleeding. Upon evaluation in the ED, the patient's hemoglobin was found to be 10.9 (baseline around 12) and she was found to have Hemoccult-positive stool on rectal exam.  Assessment/Plan:   Principal Problem:   Acute lower GI bleeding / rectal bleed / acute blood loss anemia / diverticulosis with hemorrhage  Admitted and placed on bowel rest. Diet advanced to clear liquids 06/19/14 and to full liquids today.  CT abdomen and pelvis shows extensive diverticulosis, so this is likely a diverticular bleed.  Being followed by GI.  Hemoglobin stable over the past 24 hours.  Active Problems:   Hx of radiation therapy    Essential hypertension  Being managed with amiloride-hydrochlorothiazide.    DVT Prophylaxis  SCDs ordered.  Code Status: Full. Family Communication: Family updated at bedside. Disposition Plan: Home when stable.   IV Access:    Peripheral IV   Procedures and diagnostic studies:    06/18/14: CT abdomen and pelvis: Extensive diverticulosis throughout the colon. Cholelithiasis.   Medical Consultants:    Dr. Anson Fret, Gastroenterology   Other Consultants:    None.   Anti-Infectives:    None.  Subjective:    [name] had some bloody stools overnight but her hemoglobin is stable. No abdominal pain. No nausea or vomiting. No dizziness or shortness of breath.  Objective:    Filed Vitals:   06/18/14 2205 06/19/14 0720 06/19/14 1430 06/19/14 2223  BP: 146/55 133/70 114/60 113/47  Pulse: 84 83 73 70  Temp: 98.2 F (36.8 C) 98.2 F (36.8 C) 98.2 F (36.8 C) 97.4 F (36.3 C)  TempSrc: Oral Oral Oral Oral  Resp: 20 16 18 16   Height:      Weight:      SpO2: 98% 97% 97% 96%    Intake/Output Summary (Last 24 hours) at 06/20/14 0802 Last data filed at 06/19/14 1200  Gross per 24 hour  Intake    240 ml  Output      0 ml  Net    240 ml    Exam: Gen:  NAD Cardiovascular:  RRR, No M/R/G Respiratory:  Lungs CTAB Gastrointestinal:  Abdomen soft, NT/ND, + BS Extremities:  No C/E/C   Data Reviewed:    Labs: Basic Metabolic Panel:  Recent Labs Lab 06/18/14 1314  NA 140  K 4.2  CL 102  CO2 25  GLUCOSE 85  BUN 23  CREATININE 1.04  CALCIUM 9.7   GFR Estimated Creatinine Clearance: 45.4 ml/min (by C-G formula based on Cr of 1.04). Liver Function Tests:  Recent Labs Lab 06/18/14 1314  AST 14  ALT 9  ALKPHOS 64  BILITOT 0.2*  PROT 7.0  ALBUMIN 3.4*   CBC:  Recent Labs Lab 06/18/14 1314 06/19/14 0415 06/19/14 1130 06/20/14 0446  WBC 7.6 7.3 7.8 5.9  HGB 10.9* 9.4* 10.0* 10.0*  HCT 33.6* 29.2* 29.9* 29.8*  MCV 87.3 86.4 85.2 85.1  PLT 201 210 245 231   Microbiology No results found for this  or any previous visit (from the past 240 hour(s)).   Medications:   . potassium chloride  10 mEq Oral Daily  . sodium chloride  3 mL Intravenous Q12H  . traZODone  25 mg Oral QHS  . triamterene-hydrochlorothiazide  1 tablet Oral Daily  . vitamin C  500 mg Oral Daily   Continuous Infusions:   Time spent: 25 minutes.    LOS: 2 days   [name]  [facility] Pager [phone]. If unable to reach me by pager, please call my cell phone at [phone].  *Please refer to [email or web address], password [email or web address] to get updated schedule on who will round on this patient, as hospitalists switch teams weekly. If 7PM-7AM, please contact night-coverage at  [email or web address], password [email or web address] for any overnight needs.  06/20/2014, 8:02 AM    **Disclaimer: This note was dictated with voice recognition software. Similar sounding words can inadvertently be transcribed and this note may contain transcription errors which may not have been corrected upon publication of note.**

## 2014-06-20 NOTE — Progress Notes (Signed)
Family at bedside. 

## 2014-06-20 NOTE — Progress Notes (Signed)
[facility] Progress Note  Subjective: No signs of any major GI bleeding. Did pass some old looking blood.  Objective: Vital signs in last 24 hours: Temp:  [97.4 F (36.3 C)-98.2 F (36.8 C)] 97.4 F (36.3 C) (07/29 2223) Pulse Rate:  [70-73] 70 (07/29 2223) Resp:  [16-18] 16 (07/29 2223) BP: (113-114)/(47-60) 113/47 mmHg (07/29 2223) SpO2:  [96 %-97 %] 96 % (07/29 2223) Weight change:    PE:  No distress  Heart regular rhythm  Lungs clear  Abdomen: Soft nontender  Lab Results: Results for orders placed during the hospital encounter of 06/18/14 (from the past 24 hour(s))  CBC     Status: Abnormal   Collection Time    06/19/14 11:30 AM      Result Value Ref Range   WBC 7.8  4.0 - 10.5 K/uL   RBC 3.51 (*) 3.87 - 5.11 MIL/uL   Hemoglobin 10.0 (*) 12.0 - 15.0 g/dL   HCT 29.9 (*) 36.0 - 46.0 %   MCV 85.2  78.0 - 100.0 fL   MCH 28.5  26.0 - 34.0 pg   MCHC 33.4  30.0 - 36.0 g/dL   RDW 15.6 (*) 11.5 - 15.5 %   Platelets 245  150 - 400 K/uL  CBC     Status: Abnormal   Collection Time    06/20/14  4:46 AM      Result Value Ref Range   WBC 5.9  4.0 - 10.5 K/uL   RBC 3.50 (*) 3.87 - 5.11 MIL/uL   Hemoglobin 10.0 (*) 12.0 - 15.0 g/dL   HCT 29.8 (*) 36.0 - 46.0 %   MCV 85.1  78.0 - 100.0 fL   MCH 28.6  26.0 - 34.0 pg   MCHC 33.6  30.0 - 36.0 g/dL   RDW 15.6 (*) 11.5 - 15.5 %   Platelets 231  150 - 400 K/uL    Studies/Results: No results found.    Assessment: Rectal bleeding most likely a diverticular origin  Plan: Continue observation    Emillio Ngo F 06/20/2014, 10:31 AM  Lab Results  Component Value Date   HGB 10.0* 06/20/2014   HGB 10.0* 06/19/2014   HGB 9.4* 06/19/2014   HGB 11.8 04/14/2012   HGB 11.4* 04/15/2011   HGB 11.9 04/14/2010   HCT 29.8* 06/20/2014   HCT 29.9* 06/19/2014   HCT 29.2* 06/19/2014   HCT 35.6 04/14/2012   HCT 34.0* 04/15/2011   HCT 35.4 04/14/2010   ALKPHOS 64 06/18/2014   ALKPHOS 65 11/22/2012   ALKPHOS 62 04/14/2012   AST 14 06/18/2014   AST 15 11/22/2012   AST 13 04/14/2012   ALT 9 06/18/2014   ALT 12 11/22/2012   ALT 10 04/14/2012

## 2014-06-21 LAB — HEMOGLOBIN AND HEMATOCRIT, BLOOD
HCT: 30.4 % — ABNORMAL LOW (ref 36.0–46.0)
Hemoglobin: 10.2 g/dL — ABNORMAL LOW (ref 12.0–15.0)

## 2014-06-21 NOTE — Progress Notes (Deleted)
Physician Discharge Summary  [name] OIB:[id number] DOB: May 10, 1925 DOA: 06/18/2014  PCP: Gavin Pound, MD  Admit date: 06/18/2014 Discharge date: 06/21/2014   Recommendations for Outpatient Follow-Up:   1. F/U with PCP in 3 weeks, sooner for recurrent symptoms. 2. Consider outpatient colonoscopy.   Discharge Diagnosis:   Principal Problem:    Acute lower GI bleeding with mild acute blood loss anemia Active Problems:    Hx of radiation therapy    Rectal bleed    Essential hypertension    Diverticulosis of colon with hemorrhage   Discharge Condition: Improved.  Diet recommendation: Low sodium, heart healthy.  Soft x 2 weeks, then high fiber.   History of Present Illness:   [name] is an 80 y.o. female with a PMH of endometrial cancer status post hysterectomy/radiation therapy to the pelvis completed 04/2013, ductal carcinoma of the right breast status post partial mastectomy 01/2006 and radiation therapy, invasive breast cancer of the left breast status post left mastectomy in 1990, history of recurrent DVT, venous ulcers who was admitted 06/18/14 with a chief complaint of rectal bleeding. Upon evaluation in the ED, the patient's hemoglobin was found to be 10.9 (baseline around 12) and she was found to have Hemoccult-positive stool on rectal exam.   Hospital Course by Problem:   Principal Problem:  Acute lower GI bleeding / rectal bleed / acute blood loss anemia / diverticulosis with hemorrhage  Admitted and placed on bowel rest. Diet slowly advanced prior to discharge.  CT abdomen and pelvis showed extensive diverticulosis, so source was felt to be a diverticular bleed.  Seen and evaluated by GI with no recommendations for inpatient colonoscopy.  Hemoglobin stable over the past 48 hours.  Active Problems:  Hx of radiation therapy  Essential hypertension  Being managed with amiloride-hydrochlorothiazide.   Medical Consultants:    Dr. Anson Fret, GI.   Discharge Exam:   Filed Vitals:   06/21/14 0505  BP: 131/62  Pulse: 78  Temp: 98.3 F (36.8 C)  Resp: 18   Filed Vitals:   06/19/14 2223 06/20/14 1400 06/20/14 2225 06/21/14 0505  BP: 113/47 124/46 136/60 131/62  Pulse: 70 83 86 78  Temp: 97.4 F (36.3 C) 98.1 F (36.7 C) 98.1 F (36.7 C) 98.3 F (36.8 C)  TempSrc: Oral Oral Oral Oral  Resp: 16 17 18 18   Height:      Weight:      SpO2: 96% 99% 95% 97%    Gen:  NAD Cardiovascular:  RRR, No M/R/G Respiratory: Lungs CTAB Gastrointestinal: Abdomen soft, NT/ND with normal active bowel sounds. Extremities: No C/E/C   The results of significant diagnostics from this hospitalization (including imaging, microbiology, ancillary and laboratory) are listed below for reference.     Procedures and Diagnostic Studies:   06/18/14: CT abdomen and pelvis: Extensive diverticulosis throughout the colon. Cholelithiasis.  Labs:   Basic Metabolic Panel:  Recent Labs Lab 06/18/14 1314  NA 140  K 4.2  CL 102  CO2 25  GLUCOSE 85  BUN 23  CREATININE 1.04  CALCIUM 9.7   GFR Estimated Creatinine Clearance: 45.4 ml/min (by C-G formula based on Cr of 1.04). Liver Function Tests:  Recent Labs Lab 06/18/14 1314  AST 14  ALT 9  ALKPHOS 64  BILITOT 0.2*  PROT 7.0  ALBUMIN 3.4*   CBC:  Recent Labs Lab 06/18/14 1314 06/19/14 0415 06/19/14 1130 06/20/14 0446 06/20/14 1808 06/21/14 0435  WBC 7.6 7.3 7.8 5.9  --   --  HGB 10.9* 9.4* 10.0* 10.0* 10.9* 10.2*  HCT 33.6* 29.2* 29.9* 29.8* 32.3* 30.4*  MCV 87.3 86.4 85.2 85.1  --   --   PLT 201 210 245 231  --   --     Discharge Instructions:   Discharge Instructions   Call MD for:  extreme fatigue    Complete by:  As directed      Call MD for:    Complete by:  As directed   Recurrent bloody stools.     Diet - low sodium heart healthy    Complete by:  As directed   Soft for 2 weeks, then high fiber.     Discharge instructions    Complete by:  As  directed   You were cared for by Dr. Jacquelynn Cree  (a hospitalist) during your hospital stay. If you have any questions about your discharge medications or the care you received while you were in the hospital after you are discharged, you can call the unit and ask to speak with the hospitalist on call if the hospitalist that took care of you is not available. Once you are discharged, your primary care physician will handle any further medical issues. Please note that NO REFILLS for any discharge medications will be authorized once you are discharged, as it is imperative that you return to your primary care physician (or establish a relationship with a primary care physician if you do not have one) for your aftercare needs so that they can reassess your need for medications and monitor your lab values.  Any outstanding tests can be reviewed by your PCP at your follow up visit.  It is also important to review any medicine changes with your PCP.  Please bring these d/c instructions with you to your next visit so your physician can review these changes with you.     Increase activity slowly    Complete by:  As directed             Medication List         ALPRAZolam 0.5 MG tablet  Commonly known as:  XANAX  Take 0.5 mg by mouth 3 (three) times daily as needed for anxiety. As needed     amiloride-hydrochlorothiazide 5-50 MG tablet  Commonly known as:  MODURETIC  Take 0.5 tablets by mouth daily.     CENTRUM SILVER PO  Take 1 tablet by mouth daily.     Fish Oil 1000 MG Caps  Take 1 capsule by mouth daily.     Horse Chestnut 300 MG Caps  Take 1 capsule by mouth daily.     KLOR-CON M10 10 MEQ tablet  Generic drug:  potassium chloride  Take 10 mEq by mouth daily.     Magnesium 250 MG Tabs  Take 1 tablet by mouth daily.     traZODone 50 MG tablet  Commonly known as:  DESYREL  Take 25 mg by mouth at bedtime. TAKES 1/2 TABLET     vitamin C 500 MG tablet  Commonly known as:  ASCORBIC ACID   Take 500 mg by mouth daily.     Vitamin D3 1000 UNITS Caps  Take 1,000 Units by mouth daily.           Follow-up Information   Follow up with NNODI, ADAKU, MD. Schedule an appointment as soon as possible for a visit in 3 weeks. [facility] follow up)    Specialty:  Family Medicine   Contact information:   [address]  [place] [address] [phone]        Time coordinating discharge: 35 minutes.  Signed:  Koran Seabrook  Pager [phone] [facility] 06/21/2014, 2:08 PM

## 2014-06-21 NOTE — Discharge Summary (Signed)
Physician Discharge Summary  [name] JKK:[id number] DOB: 1925/02/10 DOA: 06/18/2014  PCP: Gavin Pound, MD  Admit date: 06/18/2014 Discharge date: 06/21/2014   Recommendations for Outpatient Follow-Up:   1. F/U with PCP in 3 weeks, sooner for recurrent symptoms. 2. Consider outpatient colonoscopy.   Discharge Diagnosis:   Principal Problem:    Acute lower GI bleeding with mild acute blood loss anemia Active Problems:    Hx of radiation therapy    Rectal bleed    Essential hypertension    Diverticulosis of colon with hemorrhage   Discharge Condition: Improved.  Diet recommendation: Low sodium, heart healthy.  Soft x 2 weeks, then high fiber.   History of Present Illness:   [name] is an 80 y.o. female with a PMH of endometrial cancer status post hysterectomy/radiation therapy to the pelvis completed 04/2013, ductal carcinoma of the right breast status post partial mastectomy 01/2006 and radiation therapy, invasive breast cancer of the left breast status post left mastectomy in 1990, history of recurrent DVT, venous ulcers who was admitted 06/18/14 with a chief complaint of rectal bleeding. Upon evaluation in the ED, the patient's hemoglobin was found to be 10.9 (baseline around 12) and she was found to have Hemoccult-positive stool on rectal exam.   Hospital Course by Problem:   Principal Problem:  Acute lower GI bleeding / rectal bleed / acute blood loss anemia / diverticulosis with hemorrhage  Admitted and placed on bowel rest. Diet slowly advanced prior to discharge.  CT abdomen and pelvis showed extensive diverticulosis, so source was felt to be a diverticular bleed.  Seen and evaluated by GI with no recommendations for inpatient colonoscopy.  Hemoglobin stable over the past 48 hours.  Active Problems:  Hx of radiation therapy  Essential hypertension  Being managed with amiloride-hydrochlorothiazide.   Medical Consultants:    Dr. Anson Fret, GI.   Discharge Exam:   Filed Vitals:   06/21/14 0505  BP: 131/62  Pulse: 78  Temp: 98.3 F (36.8 C)  Resp: 18   Filed Vitals:   06/19/14 2223 06/20/14 1400 06/20/14 2225 06/21/14 0505  BP: 113/47 124/46 136/60 131/62  Pulse: 70 83 86 78  Temp: 97.4 F (36.3 C) 98.1 F (36.7 C) 98.1 F (36.7 C) 98.3 F (36.8 C)  TempSrc: Oral Oral Oral Oral  Resp: 16 17 18 18   Height:      Weight:      SpO2: 96% 99% 95% 97%    Gen:  NAD Cardiovascular:  RRR, No M/R/G Respiratory: Lungs CTAB Gastrointestinal: Abdomen soft, NT/ND with normal active bowel sounds. Extremities: No C/E/C   The results of significant diagnostics from this hospitalization (including imaging, microbiology, ancillary and laboratory) are listed below for reference.     Procedures and Diagnostic Studies:   06/18/14: CT abdomen and pelvis: Extensive diverticulosis throughout the colon. Cholelithiasis.  Labs:   Basic Metabolic Panel:  Recent Labs Lab 06/18/14 1314  NA 140  K 4.2  CL 102  CO2 25  GLUCOSE 85  BUN 23  CREATININE 1.04  CALCIUM 9.7   GFR Estimated Creatinine Clearance: 45.4 ml/min (by C-G formula based on Cr of 1.04). Liver Function Tests:  Recent Labs Lab 06/18/14 1314  AST 14  ALT 9  ALKPHOS 64  BILITOT 0.2*  PROT 7.0  ALBUMIN 3.4*   CBC:  Recent Labs Lab 06/18/14 1314 06/19/14 0415 06/19/14 1130 06/20/14 0446 06/20/14 1808 06/21/14 0435  WBC 7.6 7.3 7.8 5.9  --   --  HGB 10.9* 9.4* 10.0* 10.0* 10.9* 10.2*  HCT 33.6* 29.2* 29.9* 29.8* 32.3* 30.4*  MCV 87.3 86.4 85.2 85.1  --   --   PLT 201 210 245 231  --   --     Discharge Instructions:       Discharge Instructions   Call MD for:  extreme fatigue    Complete by:  As directed      Call MD for:    Complete by:  As directed   Recurrent bloody stools.     Diet - low sodium heart healthy    Complete by:  As directed   Soft for 2 weeks, then high fiber.     Discharge instructions    Complete by:   As directed   You were cared for by Dr. Jacquelynn Cree  (a hospitalist) during your hospital stay. If you have any questions about your discharge medications or the care you received while you were in the hospital after you are discharged, you can call the unit and ask to speak with the hospitalist on call if the hospitalist that took care of you is not available. Once you are discharged, your primary care physician will handle any further medical issues. Please note that NO REFILLS for any discharge medications will be authorized once you are discharged, as it is imperative that you return to your primary care physician (or establish a relationship with a primary care physician if you do not have one) for your aftercare needs so that they can reassess your need for medications and monitor your lab values.  Any outstanding tests can be reviewed by your PCP at your follow up visit.  It is also important to review any medicine changes with your PCP.  Please bring these d/c instructions with you to your next visit so your physician can review these changes with you.     Increase activity slowly    Complete by:  As directed             Medication List         ALPRAZolam 0.5 MG tablet  Commonly known as:  XANAX  Take 0.5 mg by mouth 3 (three) times daily as needed for anxiety. As needed     amiloride-hydrochlorothiazide 5-50 MG tablet  Commonly known as:  MODURETIC  Take 0.5 tablets by mouth daily.     CENTRUM SILVER PO  Take 1 tablet by mouth daily.     Fish Oil 1000 MG Caps  Take 1 capsule by mouth daily.     H[place] Chestnut 300 MG Caps  Take 1 capsule by mouth daily.     KLOR-CON M10 10 MEQ tablet  Generic drug:  potassium chloride  Take 10 mEq by mouth daily.     Magnesium 250 MG Tabs  Take 1 tablet by mouth daily.     traZODone 50 MG tablet  Commonly known as:  DESYREL  Take 25 mg by mouth at bedtime. TAKES 1/2 TABLET     vitamin C 500 MG tablet  Commonly known as:  ASCORBIC  ACID  Take 500 mg by mouth daily.     Vitamin D3 1000 UNITS Caps  Take 1,000 Units by mouth daily.       Follow-up Information   Follow up with NNODI, ADAKU, MD. Schedule an appointment as soon as possible for a visit in 3 weeks. [facility] follow up)    Specialty:  Family Medicine   Contact information:   [address]  [place] [address] [phone]        Time coordinating discharge: 35 minutes.  Signed:  Zanasia [name]  Pager [phone] [facility] 06/21/2014, 3:04 PM

## 2014-06-21 NOTE — Discharge Instructions (Signed)
Diverticulosis Diverticulosis is the condition that develops when small pouches (diverticula) form in the wall of your colon. Your colon, or large intestine, is where water is absorbed and stool is formed. The pouches form when the inside layer of your colon pushes through weak spots in the outer layers of your colon. CAUSES  No one knows exactly what causes diverticulosis. RISK FACTORS  Being older than 87. Your risk for this condition increases with age. Diverticulosis is rare in people younger than 40 years. By age 45, almost everyone has it.  Eating a low-fiber diet.  Being frequently constipated.  Being overweight.  Not getting enough exercise.  Smoking.  Taking over-the-counter pain medicines, like aspirin and ibuprofen. SYMPTOMS  Most people with diverticulosis do not have symptoms. DIAGNOSIS  Because diverticulosis often has no symptoms, health care providers often discover the condition during an exam for other colon problems. In many cases, a health care provider will diagnose diverticulosis while using a flexible scope to examine the colon (colonoscopy). TREATMENT  If you have never developed an infection related to diverticulosis, you may not need treatment. If you have had an infection before, treatment may include:  Eating more fruits, vegetables, and grains.  Taking a fiber supplement.  Taking a live bacteria supplement (probiotic).  Taking medicine to relax your colon. HOME CARE INSTRUCTIONS   Drink at least 6-8 glasses of water each day to prevent constipation.  Try not to strain when you have a bowel movement.  Keep all follow-up appointments. If you have had an infection before:  Increase the fiber in your diet as directed by your health care provider or dietitian.  Take a dietary fiber supplement if your health care provider approves.  Only take medicines as directed by your health care provider. SEEK MEDICAL CARE IF:   You have abdominal  pain.  You have bloating.  You have cramps.  You have not gone to the bathroom in 3 days. SEEK IMMEDIATE MEDICAL CARE IF:   Your pain gets worse.  Yourbloating becomes very bad.  You have a fever or chills, and your symptoms suddenly get worse.  You begin vomiting.  You have bowel movements that are bloody or black. MAKE SURE YOU:  Understand these instructions.  Will watch your condition.  Will get help right away if you are not doing well or get worse. Document Released: 08/05/2004 Document Revised: 11/13/2013 Document Reviewed: 10/03/2013 [facility] Patient Information 2015 [place], [place]. This information is not intended to replace advice given to you by your health care provider. Make sure you discuss any questions you have with your health care provider.  Gastrointestinal Bleeding Gastrointestinal (GI) bleeding means there is bleeding somewhere along the digestive tract, between the mouth and anus. CAUSES  There are many different problems that can cause GI bleeding. Possible causes include:  Esophagitis. This is inflammation, irritation, or swelling of the esophagus.  Hemorrhoids.These are veins that are full of blood (engorged) in the rectum. They cause pain, inflammation, and may bleed.  Anal fissures.These are areas of painful tearing which may bleed. They are often caused by passing hard stool.  Diverticulosis.These are pouches that form on the colon over time, with age, and may bleed significantly.  Diverticulitis.This is inflammation in areas with diverticulosis. It can cause pain, fever, and bloody stools, although bleeding is rare.  Polyps and cancer. Colon cancer often starts out as precancerous polyps.  Gastritis and ulcers.Bleeding from the upper gastrointestinal tract (near the stomach) may travel through the intestines  and produce black, sometimes tarry, often bad smelling stools. In certain cases, if the bleeding is fast enough, the stools may  not be black, but red. This condition may be life-threatening. SYMPTOMS   Vomiting bright red blood or material that looks like coffee grounds.  Bloody, black, or tarry stools. DIAGNOSIS  Your caregiver may diagnose your condition by taking your history and performing a physical exam. More tests may be needed, including:  X-rays and other imaging tests.  Esophagogastroduodenoscopy (EGD). This test uses a flexible, lighted tube to look at your esophagus, stomach, and small intestine.  Colonoscopy. This test uses a flexible, lighted tube to look at your colon. TREATMENT  Treatment depends on the cause of your bleeding.   For bleeding from the esophagus, stomach, small intestine, or colon, the caregiver doing your EGD or colonoscopy may be able to stop the bleeding as part of the procedure.  Inflammation or infection of the colon can be treated with medicines.  Many rectal problems can be treated with creams, suppositories, or warm baths.  Surgery is sometimes needed.  Blood transfusions are sometimes needed if you have lost a lot of blood. If bleeding is slow, you may be allowed to go home. If there is a lot of bleeding, you will need to stay in the hospital for observation. HOME CARE INSTRUCTIONS   Take any medicines exactly as prescribed.  Keep your stools soft by eating foods that are high in fiber. These foods include whole grains, legumes, fruits, and vegetables. Prunes (1 to 3 a day) work well for many people.  Drink enough fluids to keep your urine clear or pale yellow. SEEK IMMEDIATE MEDICAL CARE IF:   Your bleeding increases.  You feel lightheaded, weak, or you faint.  You have severe cramps in your back or abdomen.  You pass large blood clots in your stool.  Your problems are getting worse. MAKE SURE YOU:   Understand these instructions.  Will watch your condition.  Will get help right away if you are not doing well or get worse. Document Released:  11/05/2000 Document Revised: 10/25/2012 Document Reviewed: 10/18/2011 [facility] Patient Information 2015 [place], [place]. This information is not intended to replace advice given to you by your health care provider. Make sure you discuss any questions you have with your health care provider.

## 2014-06-21 NOTE — Progress Notes (Signed)
[facility] Progress Note  Subjective: No signs of significant active GI bleeding. Patient has tolerated for liquid diet. No abdominal pain.  Objective: Vital signs in last 24 hours: Temp:  [98.1 F (36.7 C)-98.3 F (36.8 C)] 98.3 F (36.8 C) (07/31 0505) Pulse Rate:  [78-86] 78 (07/31 0505) Resp:  [17-18] 18 (07/31 0505) BP: (124-136)/(46-62) 131/62 mmHg (07/31 0505) SpO2:  [95 %-99 %] 97 % (07/31 0505) Weight change:    PE:  She is in no distress  Heart regular rhythm  Lungs clear  Abdomen: Bowel sounds present, soft, nontender  Lab Results: Results for orders placed during the hospital encounter of 06/18/14 (from the past 24 hour(s))  HEMOGLOBIN AND HEMATOCRIT, BLOOD     Status: Abnormal   Collection Time    06/20/14  6:08 PM      Result Value Ref Range   Hemoglobin 10.9 (*) 12.0 - 15.0 g/dL   HCT 32.3 (*) 36.0 - 46.0 %  HEMOGLOBIN AND HEMATOCRIT, BLOOD     Status: Abnormal   Collection Time    06/21/14  4:35 AM      Result Value Ref Range   Hemoglobin 10.2 (*) 12.0 - 15.0 g/dL   HCT 30.4 (*) 36.0 - 46.0 %    Studies/Results: No results found.    Assessment: Lower GI bleeding secondary to diverticulosis  Plan: Advance diet. Watch for signs of further bleeding.    Wonda Horner 06/21/2014, 9:23 AM  Lab Results  Component Value Date   HGB 10.2* 06/21/2014   HGB 10.9* 06/20/2014   HGB 10.0* 06/20/2014   HGB 11.8 04/14/2012   HGB 11.4* 04/15/2011   HGB 11.9 04/14/2010   HCT 30.4* 06/21/2014   HCT 32.3* 06/20/2014   HCT 29.8* 06/20/2014   HCT 35.6 04/14/2012   HCT 34.0* 04/15/2011   HCT 35.4 04/14/2010   ALKPHOS 64 06/18/2014   ALKPHOS 65 11/22/2012   ALKPHOS 62 04/14/2012   AST 14 06/18/2014   AST 15 11/22/2012   AST 13 04/14/2012   ALT 9 06/18/2014   ALT 12 11/22/2012   ALT 10 04/14/2012

## 2014-06-21 NOTE — Progress Notes (Signed)
Nursing Discharge Summary  Patient ID: Loretto Belinsky MRN: [id number] DOB/AGE: [birth year] 78 y.o.  Admit date: 06/18/2014 Discharge date: 06/21/2014  Discharged Condition: good  Disposition: 01-Home or Self Care  Follow-up Information   Follow up with NNODI, ADAKU, MD. Schedule an appointment as soon as possible for a visit in 3 weeks. [facility] follow up)    Specialty:  Family Medicine   Contact information:   [place] [address] [phone]       Prescriptions Given: NA   Means of Discharge: Home with daughter via car.  Signed: Juanetta Snow 06/21/2014, 1:16 PM

## 2014-06-27 ENCOUNTER — Encounter (HOSPITAL_BASED_OUTPATIENT_CLINIC_OR_DEPARTMENT_OTHER): Payer: Medicare Other | Attending: Internal Medicine

## 2014-06-27 DIAGNOSIS — I87339 Chronic venous hypertension (idiopathic) with ulcer and inflammation of unspecified lower extremity: Secondary | ICD-10-CM | POA: Insufficient documentation

## 2014-06-27 DIAGNOSIS — L97809 Non-pressure chronic ulcer of other part of unspecified lower leg with unspecified severity: Secondary | ICD-10-CM | POA: Diagnosis not present

## 2014-06-27 DIAGNOSIS — L97909 Non-pressure chronic ulcer of unspecified part of unspecified lower leg with unspecified severity: Secondary | ICD-10-CM | POA: Diagnosis not present

## 2014-07-04 DIAGNOSIS — L97909 Non-pressure chronic ulcer of unspecified part of unspecified lower leg with unspecified severity: Secondary | ICD-10-CM | POA: Diagnosis not present

## 2014-07-04 DIAGNOSIS — I87339 Chronic venous hypertension (idiopathic) with ulcer and inflammation of unspecified lower extremity: Secondary | ICD-10-CM | POA: Diagnosis not present

## 2014-07-11 DIAGNOSIS — L97909 Non-pressure chronic ulcer of unspecified part of unspecified lower leg with unspecified severity: Secondary | ICD-10-CM | POA: Diagnosis not present

## 2014-07-11 DIAGNOSIS — I87339 Chronic venous hypertension (idiopathic) with ulcer and inflammation of unspecified lower extremity: Secondary | ICD-10-CM | POA: Diagnosis not present

## 2014-07-11 DIAGNOSIS — L97809 Non-pressure chronic ulcer of other part of unspecified lower leg with unspecified severity: Secondary | ICD-10-CM | POA: Diagnosis not present

## 2014-07-18 DIAGNOSIS — L97909 Non-pressure chronic ulcer of unspecified part of unspecified lower leg with unspecified severity: Secondary | ICD-10-CM | POA: Diagnosis not present

## 2014-07-18 DIAGNOSIS — I87339 Chronic venous hypertension (idiopathic) with ulcer and inflammation of unspecified lower extremity: Secondary | ICD-10-CM | POA: Diagnosis not present

## 2014-07-25 ENCOUNTER — Encounter (HOSPITAL_BASED_OUTPATIENT_CLINIC_OR_DEPARTMENT_OTHER): Payer: Medicare Other | Attending: Internal Medicine

## 2014-07-25 DIAGNOSIS — Z87898 Personal history of other specified conditions: Secondary | ICD-10-CM | POA: Diagnosis not present

## 2014-07-25 DIAGNOSIS — Z9079 Acquired absence of other genital organ(s): Secondary | ICD-10-CM | POA: Diagnosis not present

## 2014-07-25 DIAGNOSIS — Z90710 Acquired absence of both cervix and uterus: Secondary | ICD-10-CM | POA: Diagnosis not present

## 2014-07-25 DIAGNOSIS — I872 Venous insufficiency (chronic) (peripheral): Secondary | ICD-10-CM | POA: Diagnosis not present

## 2014-07-25 DIAGNOSIS — Z923 Personal history of irradiation: Secondary | ICD-10-CM | POA: Diagnosis not present

## 2014-07-25 DIAGNOSIS — Z9010 Acquired absence of unspecified breast and nipple: Secondary | ICD-10-CM | POA: Diagnosis not present

## 2014-07-25 DIAGNOSIS — I87319 Chronic venous hypertension (idiopathic) with ulcer of unspecified lower extremity: Secondary | ICD-10-CM | POA: Diagnosis present

## 2014-07-25 DIAGNOSIS — L97809 Non-pressure chronic ulcer of other part of unspecified lower leg with unspecified severity: Secondary | ICD-10-CM | POA: Diagnosis not present

## 2014-07-25 DIAGNOSIS — C549 Malignant neoplasm of corpus uteri, unspecified: Secondary | ICD-10-CM | POA: Diagnosis not present

## 2014-07-25 DIAGNOSIS — Z86718 Personal history of other venous thrombosis and embolism: Secondary | ICD-10-CM | POA: Diagnosis not present

## 2014-07-25 DIAGNOSIS — I10 Essential (primary) hypertension: Secondary | ICD-10-CM | POA: Diagnosis not present

## 2014-07-25 DIAGNOSIS — D509 Iron deficiency anemia, unspecified: Secondary | ICD-10-CM | POA: Diagnosis not present

## 2014-07-25 DIAGNOSIS — L97909 Non-pressure chronic ulcer of unspecified part of unspecified lower leg with unspecified severity: Secondary | ICD-10-CM | POA: Insufficient documentation

## 2014-07-25 DIAGNOSIS — Z79899 Other long term (current) drug therapy: Secondary | ICD-10-CM | POA: Diagnosis not present

## 2014-07-25 DIAGNOSIS — Z791 Long term (current) use of non-steroidal anti-inflammatories (NSAID): Secondary | ICD-10-CM | POA: Diagnosis not present

## 2014-07-25 DIAGNOSIS — Z8542 Personal history of malignant neoplasm of other parts of uterus: Secondary | ICD-10-CM | POA: Diagnosis not present

## 2014-08-01 DIAGNOSIS — I87319 Chronic venous hypertension (idiopathic) with ulcer of unspecified lower extremity: Secondary | ICD-10-CM | POA: Diagnosis not present

## 2014-08-06 DIAGNOSIS — K625 Hemorrhage of anus and rectum: Secondary | ICD-10-CM | POA: Diagnosis not present

## 2014-08-06 DIAGNOSIS — D649 Anemia, unspecified: Secondary | ICD-10-CM | POA: Diagnosis not present

## 2014-08-08 DIAGNOSIS — I87319 Chronic venous hypertension (idiopathic) with ulcer of unspecified lower extremity: Secondary | ICD-10-CM | POA: Diagnosis not present

## 2014-08-08 DIAGNOSIS — L97909 Non-pressure chronic ulcer of unspecified part of unspecified lower leg with unspecified severity: Secondary | ICD-10-CM | POA: Diagnosis not present

## 2014-08-15 DIAGNOSIS — I87319 Chronic venous hypertension (idiopathic) with ulcer of unspecified lower extremity: Secondary | ICD-10-CM | POA: Diagnosis not present

## 2014-08-15 DIAGNOSIS — L97909 Non-pressure chronic ulcer of unspecified part of unspecified lower leg with unspecified severity: Secondary | ICD-10-CM | POA: Diagnosis not present

## 2014-08-22 ENCOUNTER — Encounter (HOSPITAL_BASED_OUTPATIENT_CLINIC_OR_DEPARTMENT_OTHER): Payer: Medicare Other | Attending: Internal Medicine

## 2014-08-22 DIAGNOSIS — I87331 Chronic venous hypertension (idiopathic) with ulcer and inflammation of right lower extremity: Secondary | ICD-10-CM | POA: Insufficient documentation

## 2014-08-22 DIAGNOSIS — L97319 Non-pressure chronic ulcer of right ankle with unspecified severity: Secondary | ICD-10-CM | POA: Diagnosis not present

## 2014-08-22 DIAGNOSIS — L03115 Cellulitis of right lower limb: Secondary | ICD-10-CM | POA: Diagnosis not present

## 2014-08-22 DIAGNOSIS — I872 Venous insufficiency (chronic) (peripheral): Secondary | ICD-10-CM | POA: Diagnosis not present

## 2014-08-29 DIAGNOSIS — L97319 Non-pressure chronic ulcer of right ankle with unspecified severity: Secondary | ICD-10-CM | POA: Diagnosis not present

## 2014-08-29 DIAGNOSIS — I87331 Chronic venous hypertension (idiopathic) with ulcer and inflammation of right lower extremity: Secondary | ICD-10-CM | POA: Diagnosis not present

## 2014-08-29 DIAGNOSIS — I872 Venous insufficiency (chronic) (peripheral): Secondary | ICD-10-CM | POA: Diagnosis not present

## 2014-08-29 DIAGNOSIS — L03115 Cellulitis of right lower limb: Secondary | ICD-10-CM | POA: Diagnosis not present

## 2014-09-03 DIAGNOSIS — K625 Hemorrhage of anus and rectum: Secondary | ICD-10-CM | POA: Diagnosis not present

## 2014-09-05 DIAGNOSIS — L97319 Non-pressure chronic ulcer of right ankle with unspecified severity: Secondary | ICD-10-CM | POA: Diagnosis not present

## 2014-09-05 DIAGNOSIS — L03115 Cellulitis of right lower limb: Secondary | ICD-10-CM | POA: Diagnosis not present

## 2014-09-05 DIAGNOSIS — I872 Venous insufficiency (chronic) (peripheral): Secondary | ICD-10-CM | POA: Diagnosis not present

## 2014-09-05 DIAGNOSIS — I87331 Chronic venous hypertension (idiopathic) with ulcer and inflammation of right lower extremity: Secondary | ICD-10-CM | POA: Diagnosis not present

## 2014-09-12 DIAGNOSIS — L03115 Cellulitis of right lower limb: Secondary | ICD-10-CM | POA: Diagnosis not present

## 2014-09-12 DIAGNOSIS — I87331 Chronic venous hypertension (idiopathic) with ulcer and inflammation of right lower extremity: Secondary | ICD-10-CM | POA: Diagnosis not present

## 2014-09-12 DIAGNOSIS — I872 Venous insufficiency (chronic) (peripheral): Secondary | ICD-10-CM | POA: Diagnosis not present

## 2014-09-12 DIAGNOSIS — L97319 Non-pressure chronic ulcer of right ankle with unspecified severity: Secondary | ICD-10-CM | POA: Diagnosis not present

## 2014-09-19 DIAGNOSIS — I872 Venous insufficiency (chronic) (peripheral): Secondary | ICD-10-CM | POA: Diagnosis not present

## 2014-09-19 DIAGNOSIS — I87331 Chronic venous hypertension (idiopathic) with ulcer and inflammation of right lower extremity: Secondary | ICD-10-CM | POA: Diagnosis not present

## 2014-09-19 DIAGNOSIS — L03115 Cellulitis of right lower limb: Secondary | ICD-10-CM | POA: Diagnosis not present

## 2014-09-19 DIAGNOSIS — L97319 Non-pressure chronic ulcer of right ankle with unspecified severity: Secondary | ICD-10-CM | POA: Diagnosis not present

## 2014-09-26 ENCOUNTER — Encounter (HOSPITAL_BASED_OUTPATIENT_CLINIC_OR_DEPARTMENT_OTHER): Payer: Medicare Other | Attending: Internal Medicine

## 2014-09-26 DIAGNOSIS — L97829 Non-pressure chronic ulcer of other part of left lower leg with unspecified severity: Secondary | ICD-10-CM | POA: Diagnosis not present

## 2014-09-26 DIAGNOSIS — I87333 Chronic venous hypertension (idiopathic) with ulcer and inflammation of bilateral lower extremity: Secondary | ICD-10-CM | POA: Insufficient documentation

## 2014-09-26 DIAGNOSIS — L97319 Non-pressure chronic ulcer of right ankle with unspecified severity: Secondary | ICD-10-CM | POA: Insufficient documentation

## 2014-10-03 DIAGNOSIS — I87333 Chronic venous hypertension (idiopathic) with ulcer and inflammation of bilateral lower extremity: Secondary | ICD-10-CM | POA: Diagnosis not present

## 2014-10-03 DIAGNOSIS — L97829 Non-pressure chronic ulcer of other part of left lower leg with unspecified severity: Secondary | ICD-10-CM | POA: Diagnosis not present

## 2014-10-03 DIAGNOSIS — L97319 Non-pressure chronic ulcer of right ankle with unspecified severity: Secondary | ICD-10-CM | POA: Diagnosis not present

## 2014-10-10 DIAGNOSIS — I87333 Chronic venous hypertension (idiopathic) with ulcer and inflammation of bilateral lower extremity: Secondary | ICD-10-CM | POA: Diagnosis not present

## 2014-10-10 DIAGNOSIS — L97319 Non-pressure chronic ulcer of right ankle with unspecified severity: Secondary | ICD-10-CM | POA: Diagnosis not present

## 2014-10-10 DIAGNOSIS — L97829 Non-pressure chronic ulcer of other part of left lower leg with unspecified severity: Secondary | ICD-10-CM | POA: Diagnosis not present

## 2014-10-23 ENCOUNTER — Encounter (HOSPITAL_BASED_OUTPATIENT_CLINIC_OR_DEPARTMENT_OTHER): Payer: Medicare Other | Attending: Internal Medicine

## 2014-10-23 DIAGNOSIS — L97921 Non-pressure chronic ulcer of unspecified part of left lower leg limited to breakdown of skin: Secondary | ICD-10-CM | POA: Insufficient documentation

## 2014-10-23 DIAGNOSIS — L97311 Non-pressure chronic ulcer of right ankle limited to breakdown of skin: Secondary | ICD-10-CM | POA: Insufficient documentation

## 2014-10-23 DIAGNOSIS — I87333 Chronic venous hypertension (idiopathic) with ulcer and inflammation of bilateral lower extremity: Secondary | ICD-10-CM | POA: Insufficient documentation

## 2014-10-24 DIAGNOSIS — Z90710 Acquired absence of both cervix and uterus: Secondary | ICD-10-CM | POA: Diagnosis not present

## 2014-10-24 DIAGNOSIS — Z8542 Personal history of malignant neoplasm of other parts of uterus: Secondary | ICD-10-CM | POA: Diagnosis not present

## 2014-10-24 DIAGNOSIS — Z86718 Personal history of other venous thrombosis and embolism: Secondary | ICD-10-CM | POA: Diagnosis not present

## 2014-10-24 DIAGNOSIS — L97819 Non-pressure chronic ulcer of other part of right lower leg with unspecified severity: Secondary | ICD-10-CM | POA: Diagnosis not present

## 2014-10-24 DIAGNOSIS — Z8042 Family history of malignant neoplasm of prostate: Secondary | ICD-10-CM | POA: Diagnosis not present

## 2014-10-24 DIAGNOSIS — Z803 Family history of malignant neoplasm of breast: Secondary | ICD-10-CM | POA: Diagnosis not present

## 2014-10-24 DIAGNOSIS — Z853 Personal history of malignant neoplasm of breast: Secondary | ICD-10-CM | POA: Diagnosis not present

## 2014-10-24 DIAGNOSIS — Z9229 Personal history of other drug therapy: Secondary | ICD-10-CM | POA: Diagnosis not present

## 2014-10-24 DIAGNOSIS — Z79899 Other long term (current) drug therapy: Secondary | ICD-10-CM | POA: Diagnosis not present

## 2014-10-24 DIAGNOSIS — Z90722 Acquired absence of ovaries, bilateral: Secondary | ICD-10-CM | POA: Diagnosis not present

## 2014-10-24 DIAGNOSIS — Z923 Personal history of irradiation: Secondary | ICD-10-CM | POA: Diagnosis not present

## 2014-10-24 DIAGNOSIS — Z800 Family history of malignant neoplasm of digestive organs: Secondary | ICD-10-CM | POA: Diagnosis not present

## 2014-10-24 DIAGNOSIS — C541 Malignant neoplasm of endometrium: Secondary | ICD-10-CM | POA: Diagnosis not present

## 2014-10-24 DIAGNOSIS — Z7982 Long term (current) use of aspirin: Secondary | ICD-10-CM | POA: Diagnosis not present

## 2014-10-24 DIAGNOSIS — I83008 Varicose veins of unspecified lower extremity with ulcer other part of lower leg: Secondary | ICD-10-CM | POA: Diagnosis not present

## 2014-10-24 DIAGNOSIS — Z9079 Acquired absence of other genital organ(s): Secondary | ICD-10-CM | POA: Diagnosis not present

## 2014-10-24 DIAGNOSIS — Z08 Encounter for follow-up examination after completed treatment for malignant neoplasm: Secondary | ICD-10-CM | POA: Diagnosis not present

## 2014-10-24 DIAGNOSIS — L97829 Non-pressure chronic ulcer of other part of left lower leg with unspecified severity: Secondary | ICD-10-CM | POA: Diagnosis not present

## 2014-10-24 DIAGNOSIS — Z9013 Acquired absence of bilateral breasts and nipples: Secondary | ICD-10-CM | POA: Diagnosis not present

## 2014-10-24 DIAGNOSIS — I10 Essential (primary) hypertension: Secondary | ICD-10-CM | POA: Diagnosis not present

## 2014-10-24 DIAGNOSIS — D509 Iron deficiency anemia, unspecified: Secondary | ICD-10-CM | POA: Diagnosis not present

## 2014-10-31 DIAGNOSIS — L97921 Non-pressure chronic ulcer of unspecified part of left lower leg limited to breakdown of skin: Secondary | ICD-10-CM | POA: Diagnosis not present

## 2014-10-31 DIAGNOSIS — L97311 Non-pressure chronic ulcer of right ankle limited to breakdown of skin: Secondary | ICD-10-CM | POA: Diagnosis not present

## 2014-10-31 DIAGNOSIS — I87333 Chronic venous hypertension (idiopathic) with ulcer and inflammation of bilateral lower extremity: Secondary | ICD-10-CM | POA: Diagnosis not present

## 2014-11-07 DIAGNOSIS — L97311 Non-pressure chronic ulcer of right ankle limited to breakdown of skin: Secondary | ICD-10-CM | POA: Diagnosis not present

## 2014-11-07 DIAGNOSIS — L97921 Non-pressure chronic ulcer of unspecified part of left lower leg limited to breakdown of skin: Secondary | ICD-10-CM | POA: Diagnosis not present

## 2014-11-07 DIAGNOSIS — I87333 Chronic venous hypertension (idiopathic) with ulcer and inflammation of bilateral lower extremity: Secondary | ICD-10-CM | POA: Diagnosis not present

## 2014-11-14 DIAGNOSIS — I87333 Chronic venous hypertension (idiopathic) with ulcer and inflammation of bilateral lower extremity: Secondary | ICD-10-CM | POA: Diagnosis not present

## 2014-11-14 DIAGNOSIS — L97311 Non-pressure chronic ulcer of right ankle limited to breakdown of skin: Secondary | ICD-10-CM | POA: Diagnosis not present

## 2014-11-14 DIAGNOSIS — L97921 Non-pressure chronic ulcer of unspecified part of left lower leg limited to breakdown of skin: Secondary | ICD-10-CM | POA: Diagnosis not present

## 2014-11-21 DIAGNOSIS — L97311 Non-pressure chronic ulcer of right ankle limited to breakdown of skin: Secondary | ICD-10-CM | POA: Diagnosis not present

## 2014-11-21 DIAGNOSIS — L97921 Non-pressure chronic ulcer of unspecified part of left lower leg limited to breakdown of skin: Secondary | ICD-10-CM | POA: Diagnosis not present

## 2014-11-21 DIAGNOSIS — I87333 Chronic venous hypertension (idiopathic) with ulcer and inflammation of bilateral lower extremity: Secondary | ICD-10-CM | POA: Diagnosis not present

## 2014-11-25 ENCOUNTER — Encounter (HOSPITAL_BASED_OUTPATIENT_CLINIC_OR_DEPARTMENT_OTHER): Payer: Medicare Other | Attending: Internal Medicine

## 2014-11-25 DIAGNOSIS — I87333 Chronic venous hypertension (idiopathic) with ulcer and inflammation of bilateral lower extremity: Secondary | ICD-10-CM | POA: Diagnosis not present

## 2014-11-25 DIAGNOSIS — L97821 Non-pressure chronic ulcer of other part of left lower leg limited to breakdown of skin: Secondary | ICD-10-CM | POA: Insufficient documentation

## 2014-11-25 DIAGNOSIS — L97311 Non-pressure chronic ulcer of right ankle limited to breakdown of skin: Secondary | ICD-10-CM | POA: Insufficient documentation

## 2014-11-26 NOTE — Progress Notes (Signed)
[facility]  NAME:  [name], [name] NO.:  [id number]  MEDICAL RECORD NO.:  [id number]      DATE OF BIRTH:  1925/10/21  PHYSICIAN:  Theodoro Kos, DO            VISIT DATE:11/25/2014                                  OFFICE VISIT   The patient is an 81 year old female who is here for followup on her bilateral lower extremity ulcers, she has 1 on her right medial ankle and her left lateral lower extremity.  She is undergone local dressings with the [employer].  She had some drainage on the left side so came in for an evaluation, and she had a scab that was removed so the new sizes are noted.  It is superficial.  It does not appear to be infected. Likely, what happens with the change in the wraps with swelling and then resolution of it pinched the area so we will use an alginate on that with putting a new Unna boot on and have her followup on Thursday.     Theodoro Kos, DO     CS/MEDQ  D:  11/25/2014  T:  11/26/2014  Job:  [id number]

## 2014-11-28 DIAGNOSIS — I87332 Chronic venous hypertension (idiopathic) with ulcer and inflammation of left lower extremity: Secondary | ICD-10-CM | POA: Diagnosis not present

## 2014-11-28 DIAGNOSIS — L97311 Non-pressure chronic ulcer of right ankle limited to breakdown of skin: Secondary | ICD-10-CM | POA: Diagnosis not present

## 2014-11-28 DIAGNOSIS — L97821 Non-pressure chronic ulcer of other part of left lower leg limited to breakdown of skin: Secondary | ICD-10-CM | POA: Diagnosis not present

## 2014-11-28 DIAGNOSIS — I87333 Chronic venous hypertension (idiopathic) with ulcer and inflammation of bilateral lower extremity: Secondary | ICD-10-CM | POA: Diagnosis not present

## 2014-12-05 DIAGNOSIS — L97311 Non-pressure chronic ulcer of right ankle limited to breakdown of skin: Secondary | ICD-10-CM | POA: Diagnosis not present

## 2014-12-05 DIAGNOSIS — L97821 Non-pressure chronic ulcer of other part of left lower leg limited to breakdown of skin: Secondary | ICD-10-CM | POA: Diagnosis not present

## 2014-12-05 DIAGNOSIS — I87333 Chronic venous hypertension (idiopathic) with ulcer and inflammation of bilateral lower extremity: Secondary | ICD-10-CM | POA: Diagnosis not present

## 2014-12-19 DIAGNOSIS — I87333 Chronic venous hypertension (idiopathic) with ulcer and inflammation of bilateral lower extremity: Secondary | ICD-10-CM | POA: Diagnosis not present

## 2014-12-19 DIAGNOSIS — L97311 Non-pressure chronic ulcer of right ankle limited to breakdown of skin: Secondary | ICD-10-CM | POA: Diagnosis not present

## 2014-12-19 DIAGNOSIS — L97821 Non-pressure chronic ulcer of other part of left lower leg limited to breakdown of skin: Secondary | ICD-10-CM | POA: Diagnosis not present

## 2014-12-26 ENCOUNTER — Encounter (HOSPITAL_BASED_OUTPATIENT_CLINIC_OR_DEPARTMENT_OTHER): Payer: Medicare Other | Attending: Internal Medicine

## 2014-12-26 DIAGNOSIS — L97921 Non-pressure chronic ulcer of unspecified part of left lower leg limited to breakdown of skin: Secondary | ICD-10-CM | POA: Insufficient documentation

## 2014-12-26 DIAGNOSIS — I87332 Chronic venous hypertension (idiopathic) with ulcer and inflammation of left lower extremity: Secondary | ICD-10-CM | POA: Insufficient documentation

## 2015-01-02 DIAGNOSIS — I87332 Chronic venous hypertension (idiopathic) with ulcer and inflammation of left lower extremity: Secondary | ICD-10-CM | POA: Diagnosis not present

## 2015-01-02 DIAGNOSIS — L97921 Non-pressure chronic ulcer of unspecified part of left lower leg limited to breakdown of skin: Secondary | ICD-10-CM | POA: Diagnosis not present

## 2015-01-09 ENCOUNTER — Encounter (HOSPITAL_BASED_OUTPATIENT_CLINIC_OR_DEPARTMENT_OTHER): Payer: Medicare Other | Attending: Internal Medicine

## 2015-01-09 DIAGNOSIS — I87332 Chronic venous hypertension (idiopathic) with ulcer and inflammation of left lower extremity: Secondary | ICD-10-CM | POA: Diagnosis not present

## 2015-01-09 DIAGNOSIS — L97921 Non-pressure chronic ulcer of unspecified part of left lower leg limited to breakdown of skin: Secondary | ICD-10-CM | POA: Diagnosis not present

## 2015-01-23 ENCOUNTER — Encounter (HOSPITAL_BASED_OUTPATIENT_CLINIC_OR_DEPARTMENT_OTHER): Payer: Medicare Other | Attending: Internal Medicine

## 2015-01-23 DIAGNOSIS — I87332 Chronic venous hypertension (idiopathic) with ulcer and inflammation of left lower extremity: Secondary | ICD-10-CM | POA: Diagnosis not present

## 2015-01-23 DIAGNOSIS — L97821 Non-pressure chronic ulcer of other part of left lower leg limited to breakdown of skin: Secondary | ICD-10-CM | POA: Diagnosis not present

## 2015-02-06 DIAGNOSIS — Z853 Personal history of malignant neoplasm of breast: Secondary | ICD-10-CM | POA: Diagnosis not present

## 2015-02-06 DIAGNOSIS — C541 Malignant neoplasm of endometrium: Secondary | ICD-10-CM | POA: Diagnosis not present

## 2015-02-06 DIAGNOSIS — Z90722 Acquired absence of ovaries, bilateral: Secondary | ICD-10-CM | POA: Diagnosis not present

## 2015-02-06 DIAGNOSIS — I87332 Chronic venous hypertension (idiopathic) with ulcer and inflammation of left lower extremity: Secondary | ICD-10-CM | POA: Diagnosis not present

## 2015-02-06 DIAGNOSIS — L97821 Non-pressure chronic ulcer of other part of left lower leg limited to breakdown of skin: Secondary | ICD-10-CM | POA: Diagnosis not present

## 2015-02-06 DIAGNOSIS — Z8542 Personal history of malignant neoplasm of other parts of uterus: Secondary | ICD-10-CM | POA: Diagnosis not present

## 2015-02-06 DIAGNOSIS — Z9013 Acquired absence of bilateral breasts and nipples: Secondary | ICD-10-CM | POA: Diagnosis not present

## 2015-02-06 DIAGNOSIS — Z08 Encounter for follow-up examination after completed treatment for malignant neoplasm: Secondary | ICD-10-CM | POA: Diagnosis not present

## 2015-02-14 DIAGNOSIS — L97821 Non-pressure chronic ulcer of other part of left lower leg limited to breakdown of skin: Secondary | ICD-10-CM | POA: Diagnosis not present

## 2015-02-14 DIAGNOSIS — I87332 Chronic venous hypertension (idiopathic) with ulcer and inflammation of left lower extremity: Secondary | ICD-10-CM | POA: Diagnosis not present

## 2015-02-27 ENCOUNTER — Encounter (HOSPITAL_BASED_OUTPATIENT_CLINIC_OR_DEPARTMENT_OTHER): Payer: Medicare Other | Attending: Internal Medicine

## 2015-02-27 DIAGNOSIS — L97821 Non-pressure chronic ulcer of other part of left lower leg limited to breakdown of skin: Secondary | ICD-10-CM | POA: Insufficient documentation

## 2015-02-27 DIAGNOSIS — I87332 Chronic venous hypertension (idiopathic) with ulcer and inflammation of left lower extremity: Secondary | ICD-10-CM | POA: Diagnosis present

## 2015-03-03 ENCOUNTER — Other Ambulatory Visit: Payer: Self-pay

## 2015-03-03 DIAGNOSIS — Z1231 Encounter for screening mammogram for malignant neoplasm of breast: Secondary | ICD-10-CM

## 2015-03-06 DIAGNOSIS — I87332 Chronic venous hypertension (idiopathic) with ulcer and inflammation of left lower extremity: Secondary | ICD-10-CM | POA: Diagnosis not present

## 2015-03-06 DIAGNOSIS — L97821 Non-pressure chronic ulcer of other part of left lower leg limited to breakdown of skin: Secondary | ICD-10-CM | POA: Diagnosis not present

## 2015-03-13 DIAGNOSIS — L97821 Non-pressure chronic ulcer of other part of left lower leg limited to breakdown of skin: Secondary | ICD-10-CM | POA: Diagnosis not present

## 2015-03-13 DIAGNOSIS — I87332 Chronic venous hypertension (idiopathic) with ulcer and inflammation of left lower extremity: Secondary | ICD-10-CM | POA: Diagnosis not present

## 2015-04-09 DIAGNOSIS — E669 Obesity, unspecified: Secondary | ICD-10-CM | POA: Diagnosis not present

## 2015-04-09 DIAGNOSIS — Z853 Personal history of malignant neoplasm of breast: Secondary | ICD-10-CM | POA: Diagnosis not present

## 2015-04-09 DIAGNOSIS — M67919 Unspecified disorder of synovium and tendon, unspecified shoulder: Secondary | ICD-10-CM | POA: Diagnosis not present

## 2015-04-09 DIAGNOSIS — Z8542 Personal history of malignant neoplasm of other parts of uterus: Secondary | ICD-10-CM | POA: Diagnosis not present

## 2015-04-09 DIAGNOSIS — D649 Anemia, unspecified: Secondary | ICD-10-CM | POA: Diagnosis not present

## 2015-04-09 DIAGNOSIS — I10 Essential (primary) hypertension: Secondary | ICD-10-CM | POA: Diagnosis not present

## 2015-04-14 ENCOUNTER — Ambulatory Visit
Admission: RE | Admit: 2015-04-14 | Discharge: 2015-04-14 | Disposition: A | Discharge status: Home / Self Care | Payer: Medicare Other | Source: Ambulatory Visit

## 2015-04-14 DIAGNOSIS — Z1231 Encounter for screening mammogram for malignant neoplasm of breast: Secondary | ICD-10-CM | POA: Diagnosis not present

## 2015-09-05 DIAGNOSIS — R05 Cough: Secondary | ICD-10-CM | POA: Diagnosis not present

## 2015-09-05 DIAGNOSIS — L97511 Non-pressure chronic ulcer of other part of right foot limited to breakdown of skin: Secondary | ICD-10-CM | POA: Diagnosis not present

## 2015-09-05 DIAGNOSIS — R351 Nocturia: Secondary | ICD-10-CM | POA: Diagnosis not present

## 2015-11-19 DIAGNOSIS — M79644 Pain in right finger(s): Secondary | ICD-10-CM | POA: Diagnosis not present

## 2016-03-25 ENCOUNTER — Other Ambulatory Visit: Payer: Self-pay

## 2016-03-25 DIAGNOSIS — Z1231 Encounter for screening mammogram for malignant neoplasm of breast: Secondary | ICD-10-CM

## 2016-03-25 DIAGNOSIS — Z9012 Acquired absence of left breast and nipple: Secondary | ICD-10-CM

## 2016-04-26 ENCOUNTER — Ambulatory Visit
Admission: RE | Admit: 2016-04-26 | Discharge: 2016-04-26 | Disposition: A | Discharge status: Home / Self Care | Payer: Medicare Other | Source: Ambulatory Visit

## 2016-04-26 DIAGNOSIS — Z1231 Encounter for screening mammogram for malignant neoplasm of breast: Secondary | ICD-10-CM | POA: Diagnosis not present

## 2016-04-26 DIAGNOSIS — Z9012 Acquired absence of left breast and nipple: Secondary | ICD-10-CM

## 2016-05-28 DIAGNOSIS — M67919 Unspecified disorder of synovium and tendon, unspecified shoulder: Secondary | ICD-10-CM | POA: Diagnosis not present

## 2016-05-28 DIAGNOSIS — G479 Sleep disorder, unspecified: Secondary | ICD-10-CM | POA: Diagnosis not present

## 2016-05-28 DIAGNOSIS — I10 Essential (primary) hypertension: Secondary | ICD-10-CM | POA: Diagnosis not present

## 2016-05-28 DIAGNOSIS — M7521 Bicipital tendinitis, right shoulder: Secondary | ICD-10-CM | POA: Diagnosis not present

## 2016-05-28 DIAGNOSIS — M545 Low back pain: Secondary | ICD-10-CM | POA: Diagnosis not present

## 2016-05-31 DIAGNOSIS — I10 Essential (primary) hypertension: Secondary | ICD-10-CM | POA: Diagnosis not present

## 2016-05-31 DIAGNOSIS — Z853 Personal history of malignant neoplasm of breast: Secondary | ICD-10-CM | POA: Diagnosis not present

## 2016-05-31 DIAGNOSIS — G479 Sleep disorder, unspecified: Secondary | ICD-10-CM | POA: Diagnosis not present

## 2016-05-31 DIAGNOSIS — Z8542 Personal history of malignant neoplasm of other parts of uterus: Secondary | ICD-10-CM | POA: Diagnosis not present

## 2016-05-31 DIAGNOSIS — Z23 Encounter for immunization: Secondary | ICD-10-CM | POA: Diagnosis not present

## 2016-05-31 DIAGNOSIS — Z0000 Encounter for general adult medical examination without abnormal findings: Secondary | ICD-10-CM | POA: Diagnosis not present

## 2016-08-05 DIAGNOSIS — I83029 Varicose veins of left lower extremity with ulcer of unspecified site: Secondary | ICD-10-CM | POA: Diagnosis not present

## 2016-08-05 DIAGNOSIS — R600 Localized edema: Secondary | ICD-10-CM | POA: Diagnosis not present

## 2016-08-09 DIAGNOSIS — I83029 Varicose veins of left lower extremity with ulcer of unspecified site: Secondary | ICD-10-CM | POA: Diagnosis not present

## 2016-08-09 DIAGNOSIS — I872 Venous insufficiency (chronic) (peripheral): Secondary | ICD-10-CM | POA: Diagnosis not present

## 2016-08-17 DIAGNOSIS — I10 Essential (primary) hypertension: Secondary | ICD-10-CM | POA: Diagnosis not present

## 2016-08-17 DIAGNOSIS — Z9011 Acquired absence of right breast and nipple: Secondary | ICD-10-CM | POA: Diagnosis not present

## 2016-08-17 DIAGNOSIS — Z6835 Body mass index (BMI) 35.0-35.9, adult: Secondary | ICD-10-CM | POA: Diagnosis not present

## 2016-08-17 DIAGNOSIS — L97819 Non-pressure chronic ulcer of other part of right lower leg with unspecified severity: Secondary | ICD-10-CM | POA: Diagnosis not present

## 2016-08-17 DIAGNOSIS — Z853 Personal history of malignant neoplasm of breast: Secondary | ICD-10-CM | POA: Diagnosis not present

## 2016-08-17 DIAGNOSIS — I872 Venous insufficiency (chronic) (peripheral): Secondary | ICD-10-CM | POA: Diagnosis not present

## 2016-08-17 DIAGNOSIS — E669 Obesity, unspecified: Secondary | ICD-10-CM | POA: Diagnosis not present

## 2016-08-17 DIAGNOSIS — L97229 Non-pressure chronic ulcer of left calf with unspecified severity: Secondary | ICD-10-CM | POA: Diagnosis not present

## 2016-08-17 DIAGNOSIS — Z9012 Acquired absence of left breast and nipple: Secondary | ICD-10-CM | POA: Diagnosis not present

## 2016-08-17 DIAGNOSIS — Z4800 Encounter for change or removal of nonsurgical wound dressing: Secondary | ICD-10-CM | POA: Diagnosis not present

## 2016-08-17 DIAGNOSIS — D649 Anemia, unspecified: Secondary | ICD-10-CM | POA: Diagnosis not present

## 2016-08-18 DIAGNOSIS — I10 Essential (primary) hypertension: Secondary | ICD-10-CM | POA: Diagnosis not present

## 2016-08-18 DIAGNOSIS — L97819 Non-pressure chronic ulcer of other part of right lower leg with unspecified severity: Secondary | ICD-10-CM | POA: Diagnosis not present

## 2016-08-18 DIAGNOSIS — D649 Anemia, unspecified: Secondary | ICD-10-CM | POA: Diagnosis not present

## 2016-08-18 DIAGNOSIS — I872 Venous insufficiency (chronic) (peripheral): Secondary | ICD-10-CM | POA: Diagnosis not present

## 2016-08-18 DIAGNOSIS — L97229 Non-pressure chronic ulcer of left calf with unspecified severity: Secondary | ICD-10-CM | POA: Diagnosis not present

## 2016-08-18 DIAGNOSIS — Z4800 Encounter for change or removal of nonsurgical wound dressing: Secondary | ICD-10-CM | POA: Diagnosis not present

## 2016-08-20 DIAGNOSIS — I10 Essential (primary) hypertension: Secondary | ICD-10-CM | POA: Diagnosis not present

## 2016-08-20 DIAGNOSIS — D649 Anemia, unspecified: Secondary | ICD-10-CM | POA: Diagnosis not present

## 2016-08-20 DIAGNOSIS — I872 Venous insufficiency (chronic) (peripheral): Secondary | ICD-10-CM | POA: Diagnosis not present

## 2016-08-20 DIAGNOSIS — Z4800 Encounter for change or removal of nonsurgical wound dressing: Secondary | ICD-10-CM | POA: Diagnosis not present

## 2016-08-20 DIAGNOSIS — L97819 Non-pressure chronic ulcer of other part of right lower leg with unspecified severity: Secondary | ICD-10-CM | POA: Diagnosis not present

## 2016-08-20 DIAGNOSIS — L97229 Non-pressure chronic ulcer of left calf with unspecified severity: Secondary | ICD-10-CM | POA: Diagnosis not present

## 2016-08-24 DIAGNOSIS — Z4800 Encounter for change or removal of nonsurgical wound dressing: Secondary | ICD-10-CM | POA: Diagnosis not present

## 2016-08-24 DIAGNOSIS — D649 Anemia, unspecified: Secondary | ICD-10-CM | POA: Diagnosis not present

## 2016-08-24 DIAGNOSIS — L97229 Non-pressure chronic ulcer of left calf with unspecified severity: Secondary | ICD-10-CM | POA: Diagnosis not present

## 2016-08-24 DIAGNOSIS — L97819 Non-pressure chronic ulcer of other part of right lower leg with unspecified severity: Secondary | ICD-10-CM | POA: Diagnosis not present

## 2016-08-24 DIAGNOSIS — I872 Venous insufficiency (chronic) (peripheral): Secondary | ICD-10-CM | POA: Diagnosis not present

## 2016-08-24 DIAGNOSIS — I10 Essential (primary) hypertension: Secondary | ICD-10-CM | POA: Diagnosis not present

## 2016-08-27 DIAGNOSIS — I10 Essential (primary) hypertension: Secondary | ICD-10-CM | POA: Diagnosis not present

## 2016-08-27 DIAGNOSIS — Z4800 Encounter for change or removal of nonsurgical wound dressing: Secondary | ICD-10-CM | POA: Diagnosis not present

## 2016-08-27 DIAGNOSIS — L97819 Non-pressure chronic ulcer of other part of right lower leg with unspecified severity: Secondary | ICD-10-CM | POA: Diagnosis not present

## 2016-08-27 DIAGNOSIS — D649 Anemia, unspecified: Secondary | ICD-10-CM | POA: Diagnosis not present

## 2016-08-27 DIAGNOSIS — L97229 Non-pressure chronic ulcer of left calf with unspecified severity: Secondary | ICD-10-CM | POA: Diagnosis not present

## 2016-08-27 DIAGNOSIS — I872 Venous insufficiency (chronic) (peripheral): Secondary | ICD-10-CM | POA: Diagnosis not present

## 2016-08-31 DIAGNOSIS — D649 Anemia, unspecified: Secondary | ICD-10-CM | POA: Diagnosis not present

## 2016-08-31 DIAGNOSIS — Z4800 Encounter for change or removal of nonsurgical wound dressing: Secondary | ICD-10-CM | POA: Diagnosis not present

## 2016-08-31 DIAGNOSIS — L97819 Non-pressure chronic ulcer of other part of right lower leg with unspecified severity: Secondary | ICD-10-CM | POA: Diagnosis not present

## 2016-08-31 DIAGNOSIS — I872 Venous insufficiency (chronic) (peripheral): Secondary | ICD-10-CM | POA: Diagnosis not present

## 2016-08-31 DIAGNOSIS — I10 Essential (primary) hypertension: Secondary | ICD-10-CM | POA: Diagnosis not present

## 2016-08-31 DIAGNOSIS — L97229 Non-pressure chronic ulcer of left calf with unspecified severity: Secondary | ICD-10-CM | POA: Diagnosis not present

## 2016-09-03 DIAGNOSIS — I10 Essential (primary) hypertension: Secondary | ICD-10-CM | POA: Diagnosis not present

## 2016-09-03 DIAGNOSIS — Z4800 Encounter for change or removal of nonsurgical wound dressing: Secondary | ICD-10-CM | POA: Diagnosis not present

## 2016-09-03 DIAGNOSIS — L97819 Non-pressure chronic ulcer of other part of right lower leg with unspecified severity: Secondary | ICD-10-CM | POA: Diagnosis not present

## 2016-09-03 DIAGNOSIS — D649 Anemia, unspecified: Secondary | ICD-10-CM | POA: Diagnosis not present

## 2016-09-03 DIAGNOSIS — I872 Venous insufficiency (chronic) (peripheral): Secondary | ICD-10-CM | POA: Diagnosis not present

## 2016-09-03 DIAGNOSIS — L97229 Non-pressure chronic ulcer of left calf with unspecified severity: Secondary | ICD-10-CM | POA: Diagnosis not present

## 2016-09-07 DIAGNOSIS — I872 Venous insufficiency (chronic) (peripheral): Secondary | ICD-10-CM | POA: Diagnosis not present

## 2016-09-07 DIAGNOSIS — I10 Essential (primary) hypertension: Secondary | ICD-10-CM | POA: Diagnosis not present

## 2016-09-07 DIAGNOSIS — L97229 Non-pressure chronic ulcer of left calf with unspecified severity: Secondary | ICD-10-CM | POA: Diagnosis not present

## 2016-09-07 DIAGNOSIS — Z4800 Encounter for change or removal of nonsurgical wound dressing: Secondary | ICD-10-CM | POA: Diagnosis not present

## 2016-09-07 DIAGNOSIS — L97819 Non-pressure chronic ulcer of other part of right lower leg with unspecified severity: Secondary | ICD-10-CM | POA: Diagnosis not present

## 2016-09-07 DIAGNOSIS — D649 Anemia, unspecified: Secondary | ICD-10-CM | POA: Diagnosis not present

## 2016-09-09 DIAGNOSIS — I10 Essential (primary) hypertension: Secondary | ICD-10-CM | POA: Diagnosis not present

## 2016-09-09 DIAGNOSIS — Z4800 Encounter for change or removal of nonsurgical wound dressing: Secondary | ICD-10-CM | POA: Diagnosis not present

## 2016-09-09 DIAGNOSIS — I872 Venous insufficiency (chronic) (peripheral): Secondary | ICD-10-CM | POA: Diagnosis not present

## 2016-09-09 DIAGNOSIS — D649 Anemia, unspecified: Secondary | ICD-10-CM | POA: Diagnosis not present

## 2016-09-09 DIAGNOSIS — L97229 Non-pressure chronic ulcer of left calf with unspecified severity: Secondary | ICD-10-CM | POA: Diagnosis not present

## 2016-09-09 DIAGNOSIS — L97819 Non-pressure chronic ulcer of other part of right lower leg with unspecified severity: Secondary | ICD-10-CM | POA: Diagnosis not present

## 2016-09-17 DIAGNOSIS — I10 Essential (primary) hypertension: Secondary | ICD-10-CM | POA: Diagnosis not present

## 2016-09-17 DIAGNOSIS — D649 Anemia, unspecified: Secondary | ICD-10-CM | POA: Diagnosis not present

## 2016-09-17 DIAGNOSIS — L97229 Non-pressure chronic ulcer of left calf with unspecified severity: Secondary | ICD-10-CM | POA: Diagnosis not present

## 2016-09-17 DIAGNOSIS — L97819 Non-pressure chronic ulcer of other part of right lower leg with unspecified severity: Secondary | ICD-10-CM | POA: Diagnosis not present

## 2016-09-17 DIAGNOSIS — Z4800 Encounter for change or removal of nonsurgical wound dressing: Secondary | ICD-10-CM | POA: Diagnosis not present

## 2016-09-17 DIAGNOSIS — I872 Venous insufficiency (chronic) (peripheral): Secondary | ICD-10-CM | POA: Diagnosis not present

## 2016-09-24 DIAGNOSIS — L97229 Non-pressure chronic ulcer of left calf with unspecified severity: Secondary | ICD-10-CM | POA: Diagnosis not present

## 2016-09-24 DIAGNOSIS — Z4800 Encounter for change or removal of nonsurgical wound dressing: Secondary | ICD-10-CM | POA: Diagnosis not present

## 2016-09-24 DIAGNOSIS — L97819 Non-pressure chronic ulcer of other part of right lower leg with unspecified severity: Secondary | ICD-10-CM | POA: Diagnosis not present

## 2016-09-24 DIAGNOSIS — I10 Essential (primary) hypertension: Secondary | ICD-10-CM | POA: Diagnosis not present

## 2016-09-24 DIAGNOSIS — D649 Anemia, unspecified: Secondary | ICD-10-CM | POA: Diagnosis not present

## 2016-09-24 DIAGNOSIS — I872 Venous insufficiency (chronic) (peripheral): Secondary | ICD-10-CM | POA: Diagnosis not present

## 2016-10-01 DIAGNOSIS — I872 Venous insufficiency (chronic) (peripheral): Secondary | ICD-10-CM | POA: Diagnosis not present

## 2016-10-01 DIAGNOSIS — Z4800 Encounter for change or removal of nonsurgical wound dressing: Secondary | ICD-10-CM | POA: Diagnosis not present

## 2016-10-01 DIAGNOSIS — D649 Anemia, unspecified: Secondary | ICD-10-CM | POA: Diagnosis not present

## 2016-10-01 DIAGNOSIS — I10 Essential (primary) hypertension: Secondary | ICD-10-CM | POA: Diagnosis not present

## 2016-10-01 DIAGNOSIS — L97819 Non-pressure chronic ulcer of other part of right lower leg with unspecified severity: Secondary | ICD-10-CM | POA: Diagnosis not present

## 2016-10-01 DIAGNOSIS — L97229 Non-pressure chronic ulcer of left calf with unspecified severity: Secondary | ICD-10-CM | POA: Diagnosis not present

## 2016-10-11 DIAGNOSIS — I872 Venous insufficiency (chronic) (peripheral): Secondary | ICD-10-CM | POA: Diagnosis not present

## 2016-10-11 DIAGNOSIS — L97819 Non-pressure chronic ulcer of other part of right lower leg with unspecified severity: Secondary | ICD-10-CM | POA: Diagnosis not present

## 2016-10-11 DIAGNOSIS — D649 Anemia, unspecified: Secondary | ICD-10-CM | POA: Diagnosis not present

## 2016-10-11 DIAGNOSIS — Z4800 Encounter for change or removal of nonsurgical wound dressing: Secondary | ICD-10-CM | POA: Diagnosis not present

## 2016-10-11 DIAGNOSIS — I10 Essential (primary) hypertension: Secondary | ICD-10-CM | POA: Diagnosis not present

## 2016-10-11 DIAGNOSIS — L97229 Non-pressure chronic ulcer of left calf with unspecified severity: Secondary | ICD-10-CM | POA: Diagnosis not present

## 2016-10-15 DIAGNOSIS — Z4800 Encounter for change or removal of nonsurgical wound dressing: Secondary | ICD-10-CM | POA: Diagnosis not present

## 2016-10-15 DIAGNOSIS — I10 Essential (primary) hypertension: Secondary | ICD-10-CM | POA: Diagnosis not present

## 2016-10-15 DIAGNOSIS — I872 Venous insufficiency (chronic) (peripheral): Secondary | ICD-10-CM | POA: Diagnosis not present

## 2016-10-15 DIAGNOSIS — D649 Anemia, unspecified: Secondary | ICD-10-CM | POA: Diagnosis not present

## 2016-10-15 DIAGNOSIS — L97229 Non-pressure chronic ulcer of left calf with unspecified severity: Secondary | ICD-10-CM | POA: Diagnosis not present

## 2016-10-15 DIAGNOSIS — L97819 Non-pressure chronic ulcer of other part of right lower leg with unspecified severity: Secondary | ICD-10-CM | POA: Diagnosis not present

## 2016-10-16 DIAGNOSIS — L97222 Non-pressure chronic ulcer of left calf with fat layer exposed: Secondary | ICD-10-CM | POA: Diagnosis not present

## 2016-10-16 DIAGNOSIS — Z853 Personal history of malignant neoplasm of breast: Secondary | ICD-10-CM | POA: Diagnosis not present

## 2016-10-16 DIAGNOSIS — Z9012 Acquired absence of left breast and nipple: Secondary | ICD-10-CM | POA: Diagnosis not present

## 2016-10-16 DIAGNOSIS — D649 Anemia, unspecified: Secondary | ICD-10-CM | POA: Diagnosis not present

## 2016-10-16 DIAGNOSIS — Z9011 Acquired absence of right breast and nipple: Secondary | ICD-10-CM | POA: Diagnosis not present

## 2016-10-16 DIAGNOSIS — Z4800 Encounter for change or removal of nonsurgical wound dressing: Secondary | ICD-10-CM | POA: Diagnosis not present

## 2016-10-16 DIAGNOSIS — L97812 Non-pressure chronic ulcer of other part of right lower leg with fat layer exposed: Secondary | ICD-10-CM | POA: Diagnosis not present

## 2016-10-16 DIAGNOSIS — I872 Venous insufficiency (chronic) (peripheral): Secondary | ICD-10-CM | POA: Diagnosis not present

## 2016-10-16 DIAGNOSIS — I10 Essential (primary) hypertension: Secondary | ICD-10-CM | POA: Diagnosis not present

## 2016-10-22 DIAGNOSIS — L97812 Non-pressure chronic ulcer of other part of right lower leg with fat layer exposed: Secondary | ICD-10-CM | POA: Diagnosis not present

## 2016-10-22 DIAGNOSIS — L97222 Non-pressure chronic ulcer of left calf with fat layer exposed: Secondary | ICD-10-CM | POA: Diagnosis not present

## 2016-10-22 DIAGNOSIS — I10 Essential (primary) hypertension: Secondary | ICD-10-CM | POA: Diagnosis not present

## 2016-10-22 DIAGNOSIS — D649 Anemia, unspecified: Secondary | ICD-10-CM | POA: Diagnosis not present

## 2016-10-22 DIAGNOSIS — Z4800 Encounter for change or removal of nonsurgical wound dressing: Secondary | ICD-10-CM | POA: Diagnosis not present

## 2016-10-22 DIAGNOSIS — I872 Venous insufficiency (chronic) (peripheral): Secondary | ICD-10-CM | POA: Diagnosis not present

## 2016-10-29 DIAGNOSIS — L97222 Non-pressure chronic ulcer of left calf with fat layer exposed: Secondary | ICD-10-CM | POA: Diagnosis not present

## 2016-10-29 DIAGNOSIS — I872 Venous insufficiency (chronic) (peripheral): Secondary | ICD-10-CM | POA: Diagnosis not present

## 2016-10-29 DIAGNOSIS — I10 Essential (primary) hypertension: Secondary | ICD-10-CM | POA: Diagnosis not present

## 2016-10-29 DIAGNOSIS — L97812 Non-pressure chronic ulcer of other part of right lower leg with fat layer exposed: Secondary | ICD-10-CM | POA: Diagnosis not present

## 2016-10-29 DIAGNOSIS — D649 Anemia, unspecified: Secondary | ICD-10-CM | POA: Diagnosis not present

## 2016-10-29 DIAGNOSIS — Z4800 Encounter for change or removal of nonsurgical wound dressing: Secondary | ICD-10-CM | POA: Diagnosis not present

## 2016-11-01 DIAGNOSIS — I10 Essential (primary) hypertension: Secondary | ICD-10-CM | POA: Diagnosis not present

## 2016-11-01 DIAGNOSIS — D649 Anemia, unspecified: Secondary | ICD-10-CM | POA: Diagnosis not present

## 2016-11-01 DIAGNOSIS — Z4800 Encounter for change or removal of nonsurgical wound dressing: Secondary | ICD-10-CM | POA: Diagnosis not present

## 2016-11-01 DIAGNOSIS — L97812 Non-pressure chronic ulcer of other part of right lower leg with fat layer exposed: Secondary | ICD-10-CM | POA: Diagnosis not present

## 2016-11-01 DIAGNOSIS — I872 Venous insufficiency (chronic) (peripheral): Secondary | ICD-10-CM | POA: Diagnosis not present

## 2016-11-01 DIAGNOSIS — L97222 Non-pressure chronic ulcer of left calf with fat layer exposed: Secondary | ICD-10-CM | POA: Diagnosis not present

## 2016-11-05 DIAGNOSIS — Z4800 Encounter for change or removal of nonsurgical wound dressing: Secondary | ICD-10-CM | POA: Diagnosis not present

## 2016-11-05 DIAGNOSIS — L97222 Non-pressure chronic ulcer of left calf with fat layer exposed: Secondary | ICD-10-CM | POA: Diagnosis not present

## 2016-11-05 DIAGNOSIS — D649 Anemia, unspecified: Secondary | ICD-10-CM | POA: Diagnosis not present

## 2016-11-05 DIAGNOSIS — I872 Venous insufficiency (chronic) (peripheral): Secondary | ICD-10-CM | POA: Diagnosis not present

## 2016-11-05 DIAGNOSIS — L97812 Non-pressure chronic ulcer of other part of right lower leg with fat layer exposed: Secondary | ICD-10-CM | POA: Diagnosis not present

## 2016-11-05 DIAGNOSIS — I10 Essential (primary) hypertension: Secondary | ICD-10-CM | POA: Diagnosis not present

## 2016-11-12 DIAGNOSIS — Z4800 Encounter for change or removal of nonsurgical wound dressing: Secondary | ICD-10-CM | POA: Diagnosis not present

## 2016-11-12 DIAGNOSIS — I872 Venous insufficiency (chronic) (peripheral): Secondary | ICD-10-CM | POA: Diagnosis not present

## 2016-11-12 DIAGNOSIS — L97222 Non-pressure chronic ulcer of left calf with fat layer exposed: Secondary | ICD-10-CM | POA: Diagnosis not present

## 2016-11-12 DIAGNOSIS — L97812 Non-pressure chronic ulcer of other part of right lower leg with fat layer exposed: Secondary | ICD-10-CM | POA: Diagnosis not present

## 2016-11-12 DIAGNOSIS — I10 Essential (primary) hypertension: Secondary | ICD-10-CM | POA: Diagnosis not present

## 2016-11-12 DIAGNOSIS — D649 Anemia, unspecified: Secondary | ICD-10-CM | POA: Diagnosis not present

## 2016-11-19 DIAGNOSIS — L97222 Non-pressure chronic ulcer of left calf with fat layer exposed: Secondary | ICD-10-CM | POA: Diagnosis not present

## 2016-11-19 DIAGNOSIS — I10 Essential (primary) hypertension: Secondary | ICD-10-CM | POA: Diagnosis not present

## 2016-11-19 DIAGNOSIS — I872 Venous insufficiency (chronic) (peripheral): Secondary | ICD-10-CM | POA: Diagnosis not present

## 2016-11-19 DIAGNOSIS — Z4800 Encounter for change or removal of nonsurgical wound dressing: Secondary | ICD-10-CM | POA: Diagnosis not present

## 2016-11-19 DIAGNOSIS — D649 Anemia, unspecified: Secondary | ICD-10-CM | POA: Diagnosis not present

## 2016-11-19 DIAGNOSIS — L97812 Non-pressure chronic ulcer of other part of right lower leg with fat layer exposed: Secondary | ICD-10-CM | POA: Diagnosis not present

## 2016-12-15 DIAGNOSIS — J069 Acute upper respiratory infection, unspecified: Secondary | ICD-10-CM | POA: Diagnosis not present

## 2017-03-30 ENCOUNTER — Other Ambulatory Visit: Payer: Self-pay | Admitting: Family Medicine

## 2017-03-30 DIAGNOSIS — Z1231 Encounter for screening mammogram for malignant neoplasm of breast: Secondary | ICD-10-CM

## 2017-03-30 DIAGNOSIS — Z9012 Acquired absence of left breast and nipple: Secondary | ICD-10-CM

## 2017-05-09 ENCOUNTER — Ambulatory Visit
Admission: RE | Admit: 2017-05-09 | Discharge: 2017-05-09 | Disposition: A | Discharge status: Home / Self Care | Payer: Medicare Other | Source: Ambulatory Visit | Attending: Family Medicine | Admitting: Family Medicine

## 2017-05-09 DIAGNOSIS — Z9012 Acquired absence of left breast and nipple: Secondary | ICD-10-CM

## 2017-05-09 DIAGNOSIS — Z1231 Encounter for screening mammogram for malignant neoplasm of breast: Secondary | ICD-10-CM

## 2017-05-09 HISTORY — DX: Personal history of irradiation: Z92.3

## 2017-09-05 ENCOUNTER — Inpatient Hospital Stay (HOSPITAL_COMMUNITY)
Admission: AD | Admit: 2017-09-05 | Discharge: 2017-09-08 | DRG: 378 | Disposition: A | Discharge status: Home Health | Payer: Medicare Other | Source: Ambulatory Visit | Attending: Internal Medicine | Admitting: Internal Medicine

## 2017-09-05 ENCOUNTER — Encounter (HOSPITAL_COMMUNITY): Payer: Self-pay

## 2017-09-05 ENCOUNTER — Inpatient Hospital Stay (HOSPITAL_COMMUNITY): Payer: Medicare Other

## 2017-09-05 DIAGNOSIS — K5730 Diverticulosis of large intestine without perforation or abscess without bleeding: Secondary | ICD-10-CM | POA: Diagnosis present

## 2017-09-05 DIAGNOSIS — Z8542 Personal history of malignant neoplasm of other parts of uterus: Secondary | ICD-10-CM

## 2017-09-05 DIAGNOSIS — K921 Melena: Secondary | ICD-10-CM | POA: Diagnosis present

## 2017-09-05 DIAGNOSIS — Z79818 Long term (current) use of other agents affecting estrogen receptors and estrogen levels: Secondary | ICD-10-CM

## 2017-09-05 DIAGNOSIS — Z90710 Acquired absence of both cervix and uterus: Secondary | ICD-10-CM | POA: Diagnosis not present

## 2017-09-05 DIAGNOSIS — Z7982 Long term (current) use of aspirin: Secondary | ICD-10-CM

## 2017-09-05 DIAGNOSIS — D62 Acute posthemorrhagic anemia: Secondary | ICD-10-CM | POA: Diagnosis present

## 2017-09-05 DIAGNOSIS — Z800 Family history of malignant neoplasm of digestive organs: Secondary | ICD-10-CM | POA: Diagnosis not present

## 2017-09-05 DIAGNOSIS — K922 Gastrointestinal hemorrhage, unspecified: Secondary | ICD-10-CM | POA: Diagnosis not present

## 2017-09-05 DIAGNOSIS — Z923 Personal history of irradiation: Secondary | ICD-10-CM | POA: Diagnosis not present

## 2017-09-05 DIAGNOSIS — Z803 Family history of malignant neoplasm of breast: Secondary | ICD-10-CM | POA: Diagnosis not present

## 2017-09-05 DIAGNOSIS — I10 Essential (primary) hypertension: Secondary | ICD-10-CM | POA: Diagnosis present

## 2017-09-05 DIAGNOSIS — N179 Acute kidney failure, unspecified: Secondary | ICD-10-CM | POA: Diagnosis present

## 2017-09-05 DIAGNOSIS — Z853 Personal history of malignant neoplasm of breast: Secondary | ICD-10-CM

## 2017-09-05 DIAGNOSIS — I83009 Varicose veins of unspecified lower extremity with ulcer of unspecified site: Secondary | ICD-10-CM | POA: Diagnosis present

## 2017-09-05 DIAGNOSIS — C50919 Malignant neoplasm of unspecified site of unspecified female breast: Secondary | ICD-10-CM | POA: Diagnosis present

## 2017-09-05 DIAGNOSIS — F419 Anxiety disorder, unspecified: Secondary | ICD-10-CM | POA: Diagnosis present

## 2017-09-05 DIAGNOSIS — Z8249 Family history of ischemic heart disease and other diseases of the circulatory system: Secondary | ICD-10-CM | POA: Diagnosis not present

## 2017-09-05 DIAGNOSIS — Z9012 Acquired absence of left breast and nipple: Secondary | ICD-10-CM

## 2017-09-05 DIAGNOSIS — E876 Hypokalemia: Secondary | ICD-10-CM | POA: Diagnosis present

## 2017-09-05 DIAGNOSIS — Z86718 Personal history of other venous thrombosis and embolism: Secondary | ICD-10-CM

## 2017-09-05 DIAGNOSIS — L97909 Non-pressure chronic ulcer of unspecified part of unspecified lower leg with unspecified severity: Secondary | ICD-10-CM | POA: Diagnosis present

## 2017-09-05 DIAGNOSIS — K625 Hemorrhage of anus and rectum: Secondary | ICD-10-CM

## 2017-09-05 LAB — CBC
HCT: 19.8 % — ABNORMAL LOW (ref 36.0–46.0)
Hemoglobin: 6.7 g/dL — CL (ref 12.0–15.0)
MCH: 29 pg (ref 26.0–34.0)
MCHC: 33.8 g/dL (ref 30.0–36.0)
MCV: 85.7 fL (ref 78.0–100.0)
Platelets: 266 10*3/uL (ref 150–400)
RBC: 2.31 MIL/uL — ABNORMAL LOW (ref 3.87–5.11)
RDW: 17 % — ABNORMAL HIGH (ref 11.5–15.5)
WBC: 8.9 10*3/uL (ref 4.0–10.5)

## 2017-09-05 LAB — COMPREHENSIVE METABOLIC PANEL
ALT: 10 U/L — ABNORMAL LOW (ref 14–54)
AST: 16 U/L (ref 15–41)
Albumin: 3.3 g/dL — ABNORMAL LOW (ref 3.5–5.0)
Alkaline Phosphatase: 52 U/L (ref 38–126)
Anion gap: 10 (ref 5–15)
BUN: 20 mg/dL (ref 6–20)
CHLORIDE: 106 mmol/L (ref 101–111)
CO2: 24 mmol/L (ref 22–32)
CREATININE: 1.02 mg/dL — AB (ref 0.44–1.00)
Calcium: 9.2 mg/dL (ref 8.9–10.3)
GFR, EST AFRICAN AMERICAN: 54 mL/min — AB (ref >60)
GFR, EST NON AFRICAN AMERICAN: 47 mL/min — AB (ref >60)
Glucose, Bld: 98 mg/dL (ref 65–99)
POTASSIUM: 3.7 mmol/L (ref 3.5–5.1)
Sodium: 140 mmol/L (ref 135–145)
Total Bilirubin: 0.3 mg/dL (ref 0.3–1.2)
Total Protein: 6.1 g/dL — ABNORMAL LOW (ref 6.5–8.1)

## 2017-09-05 LAB — PREPARE RBC (CROSSMATCH)

## 2017-09-05 LAB — MRSA PCR SCREENING: MRSA by PCR: NEGATIVE

## 2017-09-05 MED ORDER — IOPAMIDOL (ISOVUE-300) INJECTION 61%
INTRAVENOUS | Status: AC
  Administered 2017-09-05: 19:00:00
  Filled 2017-09-05: qty 100

## 2017-09-05 MED ORDER — IOPAMIDOL (ISOVUE-300) INJECTION 61%
100.0000 mL | Freq: Once | INTRAVENOUS | Status: AC | PRN
  Administered 2017-09-05: 100 mL via INTRAVENOUS

## 2017-09-05 MED ORDER — POTASSIUM CHLORIDE CRYS ER 10 MEQ PO TBCR
10.0000 meq | EXTENDED_RELEASE_TABLET | Freq: Every day | ORAL | Status: DC
  Administered 2017-09-05 – 2017-09-08 (×4): 10 meq via ORAL
  Filled 2017-09-05 (×4): qty 1

## 2017-09-05 MED ORDER — SIMETHICONE 80 MG PO CHEW
160.0000 mg | CHEWABLE_TABLET | Freq: Four times a day (QID) | ORAL | Status: DC | PRN
  Administered 2017-09-05 – 2017-09-06 (×3): 160 mg via ORAL
  Filled 2017-09-05 (×3): qty 2

## 2017-09-05 MED ORDER — SODIUM CHLORIDE 0.9 % IV SOLN
Freq: Once | INTRAVENOUS | Status: AC
  Administered 2017-09-05: 20:00:00 via INTRAVENOUS

## 2017-09-05 MED ORDER — TRAZODONE HCL 50 MG PO TABS
25.0000 mg | ORAL_TABLET | Freq: Every day | ORAL | Status: DC
  Administered 2017-09-05 – 2017-09-07 (×3): 25 mg via ORAL
  Filled 2017-09-05 (×3): qty 1

## 2017-09-05 NOTE — Consult Note (Signed)
Referring Provider: Dr. Florene Glen Primary Care Physician:  Aretta Nip, MD Primary Gastroenterologist:  Dr. Paulita Fujita  Reason for Consultation:  GI bleed  HPI: [name] is a 83 y.o. female with a history of presumed diverticular bleeding in 2015 who had a barium enema at that time that showed extensive diverticulosis. She has never had a colonoscopy. Starting last Wednesday she began having bloody diarrhea that would occur 2 times per day described as red and dark red colored stool. Stools have been loose since the onset of the bleeding. Felt dizzy as well for the past week. Had lower abdominal pain as well since onset of the bleeding. Denies melena, nausea or vomiting. Denies any bleeding or stools today. Hgb 6.3 today at Dr. Dahlia Bailiff office (Hgb 11.2 on 08/31/17).  Past Medical History:  Diagnosis Date  . Anxiety   . Breast cancer ([place])    left 1994, right 2007  . Diverticulosis   . Diverticulosis of colon with hemorrhage 06/19/2014  . DVT (deep venous thrombosis) ([place]) 1992   right leg x 2  . Endometrial adenocarcinoma ([place]) 12/25/12   biopsy  . Family history of malignant neoplasm of gastrointestinal tract   . Hx of radiation therapy 02/2006 - 03/2006   right breast  . Hx of radiation therapy 5/12, 5/19, 5/29, 6/2, 04/30/2013   proximal vagina, 30 Gy in 5 sessions, HDR  . Hypertension   . Iron deficiency   . Personal history of radiation therapy   . Ulcers of both lower extremities (HCC)    chronic  . Venous stasis    bilateral    Past Surgical History:  Procedure Laterality Date  . ABDOMINAL HYSTERECTOMY  01/18/13   robotic, bso  . BREAST LUMPECTOMY    . diverticulosis    . MASTECTOMY Left 1994   left , Tamoxifen x 17 yrs  . partial mastectomy Right 2007   radiation    Prior to Admission medications   Medication Sig Start Date End Date Taking? Authorizing Provider  ALPRAZolam Duanne Moron) 0.5 MG tablet Take 0.5 mg by mouth 3 (three) times daily as needed for anxiety.  As needed 08/10/11  Yes [provider]  amiloride-hydrochlorothiazide (MODURETIC) 5-50 MG tablet Take 0.5 tablets by mouth daily.  08/16/11  Yes [provider]  aspirin EC 325 MG tablet Take 325 mg by mouth daily.   Yes [provider]  Cholecalciferol (VITAMIN D3) 1000 UNITS CAPS Take 1,000 Units by mouth daily.    Yes [provider]  Horse Chestnut 300 MG CAPS Take 1 capsule by mouth daily.   Yes [provider]  KLOR-CON M10 10 MEQ tablet Take 10 mEq by mouth daily.  06/22/11  Yes [provider]  Magnesium 250 MG TABS Take 1 tablet by mouth daily.   Yes [provider]  Multiple Vitamins-Minerals (CENTRUM SILVER PO) Take 1 tablet by mouth daily.     Yes [provider]  Omega-3 Fatty Acids (FISH OIL) 1000 MG CAPS Take 1 capsule by mouth daily.     Yes [provider]  traZODone (DESYREL) 50 MG tablet Take 25 mg by mouth at bedtime. TAKES 1/2 TABLET   Yes [provider]  vitamin C (ASCORBIC ACID) 500 MG tablet Take 500 mg by mouth daily.   Yes [provider]    Scheduled Meds: . potassium chloride  10 mEq Oral Daily  . traZODone  25 mg Oral QHS   Continuous Infusions: PRN Meds:.  Allergies as of  09/05/2017  . (No Known Allergies)    Family History  Problem Relation Age of Onset  . Cancer Father        skin  . Colon cancer Brother   . Cancer Brother        prostate  . Heart disease Brother   . Breast cancer Daughter   . Colon cancer Sister   . Heart disease Sister        MI  . Breast cancer Sister   . Anuerysm Sister     Social History   Social History  . Marital status: Widowed    Spouse name: N/A  . Number of children: 6  . Years of education: N/A   Occupational History  . Retired     Social History Main Topics  . Smoking status: Never Smoker  . Smokeless tobacco: Never Used  . Alcohol use No  . Drug use: No  . Sexual activity: No   Other Topics Concern   . Not on file   Social History Narrative   2 caffeine drinks daily     Review of Systems: All negative except as stated above in HPI.  Physical Exam: Vital signs: Vitals:   09/05/17 1429 09/05/17 1500  BP:  (!) 150/49  Pulse:  73  Resp:  13  Temp: 97.7 F (36.5 C)   SpO2:  100%     General:   Lethargic, elderly, thin, pleasant and cooperative in NAD Head: normocephalic, atraumatic Eyes: anicteric sclera ENT: oropharynx clear Neck: supple, nontender Lungs:  Clear throughout to auscultation.   No wheezes, crackles, or rhonchi. No acute distress. Heart:  Regular rate and rhythm; no murmurs, clicks, rubs,  or gallops. Abdomen: lower abdominal tenderness with minimal guarding, soft, nondistended, +BS  Rectal:  Deferred Ext: no edema  GI:  Lab Results: No results for input(s): WBC, HGB, HCT, PLT in the last 72 hours. BMET No results for input(s): NA, K, CL, CO2, GLUCOSE, BUN, CREATININE, CALCIUM in the last 72 hours. LFT No results for input(s): PROT, ALBUMIN, AST, ALT, ALKPHOS, BILITOT, BILIDIR, IBILI in the last 72 hours. PT/INR No results for input(s): LABPROT, INR in the last 72 hours.   Studies/Results: No results found.  Impression/Plan: 83 yo with hematochezia with loose stools and lower abdominal pain concerning for ischemic colitis more so than diverticular bleeding. Will do an abd/pelvis CT with contrast (if serum Cr ok) to evaluate for colitis and if colonic inflammation present then will need IV Abx. Clear liquid diet. Due to advanced age will hold off on a colonoscopy unless bleeding persists. Supportive care. Will follow.    LOS: 0 days   [place] C.  09/05/2017, 5:05 PM  Pager [phone]  AFTER 5 pm or on weekends please call [phone]

## 2017-09-05 NOTE — H&P (Addendum)
History and Physical    Jamille Fisher JAS:[id number] DOB: 1925/07/10 DOA: 09/05/2017  PCP: Aretta Nip, MD  Patient coming from: home  I have personally briefly reviewed patient's old medical records in [place]  Chief Complaint:  Abnormal labs  HPI: [name] is [name] 83 y.o. female with medical history significant of breast cancer, diverticulosis, HTN, here with concern for Jayce Boyko lower GI bleed.    Patient was directly admitted to the [facility] stepdown unit from clinic. For the past week she's noticed dark red bowel movements. She notes that she has about 2x Tyreece Gelles day.  She denies melena. She denies nausea or vomiting. The only other symptom she notes is lightheadedness.  She denies chest pain, shortness of breath, and abdominal pain.   Review of Systems: As per HPI otherwise 10 point review of systems negative.    Past Medical History:  Diagnosis Date  . Anxiety   . Breast cancer ([place])    left 1994, right 2007  . Diverticulosis   . Diverticulosis of colon with hemorrhage 06/19/2014  . DVT (deep venous thrombosis) ([place]) 1992   right leg x 2  . Endometrial adenocarcinoma ([place]) 12/25/12   biopsy  . Family history of malignant neoplasm of gastrointestinal tract   . Hx of radiation therapy 02/2006 - 03/2006   right breast  . Hx of radiation therapy 5/12, 5/19, 5/29, 6/2, 04/30/2013   proximal vagina, 30 Gy in 5 sessions, HDR  . Hypertension   . Iron deficiency   . Personal history of radiation therapy   . Ulcers of both lower extremities (HCC)    chronic  . Venous stasis    bilateral    Past Surgical History:  Procedure Laterality Date  . ABDOMINAL HYSTERECTOMY  01/18/13   robotic, bso  . BREAST LUMPECTOMY    . diverticulosis    . MASTECTOMY Left 1994   left , Tamoxifen x 17 yrs  . partial mastectomy Right 2007   radiation     reports that she has never smoked. She has never used smokeless tobacco. She reports that she does not drink alcohol or use  drugs.  No Known Allergies  Family History  Problem Relation Age of Onset  . Cancer Father        skin  . Colon cancer Brother   . Cancer Brother        prostate  . Heart disease Brother   . Breast cancer Daughter   . Colon cancer Sister   . Heart disease Sister        MI  . Breast cancer Sister   . Anuerysm Sister    Prior to Admission medications   Medication Sig Start Date End Date Taking? Authorizing Provider  ALPRAZolam Duanne Moron) 0.5 MG tablet Take 0.5 mg by mouth 3 (three) times daily as needed for anxiety. As needed 08/10/11  Yes [provider]  amiloride-hydrochlorothiazide (MODURETIC) 5-50 MG tablet Take 0.5 tablets by mouth daily.  08/16/11  Yes [provider]  aspirin EC 325 MG tablet Take 325 mg by mouth daily.   Yes [provider]  Cholecalciferol (VITAMIN D3) 1000 UNITS CAPS Take 1,000 Units by mouth daily.    Yes [provider]  H[place] Chestnut 300 MG CAPS Take 1 capsule by mouth daily.   Yes [provider]  KLOR-CON M10 10 MEQ tablet Take 10 mEq by mouth daily.  06/22/11  Yes [provider]  Magnesium 250 MG TABS Take 1  tablet by mouth daily.   Yes [provider]  Multiple Vitamins-Minerals (CENTRUM SILVER PO) Take 1 tablet by mouth daily.     Yes [provider]  Omega-3 Fatty Acids (FISH OIL) 1000 MG CAPS Take 1 capsule by mouth daily.     Yes [provider]  traZODone (DESYREL) 50 MG tablet Take 25 mg by mouth at bedtime. TAKES 1/2 TABLET   Yes [provider]  vitamin C (ASCORBIC ACID) 500 MG tablet Take 500 mg by mouth daily.   Yes [provider]    Physical Exam: Vitals:   09/05/17 1429 09/05/17 1500  BP:  (!) 150/49  Pulse:  73  Resp:  13  Temp: 97.7 F (36.5 C)   TempSrc: Oral   SpO2:  100%  Weight: 85.8 kg (189 lb 2.5 oz)   Height: 5\' 6"  (1.676 m)     Constitutional: NAD, calm, comfortable Vitals:   09/05/17 1429 09/05/17 1500  BP:  (!)  150/49  Pulse:  73  Resp:  13  Temp: 97.7 F (36.5 C)   TempSrc: Oral   SpO2:  100%  Weight: 85.8 kg (189 lb 2.5 oz)   Height: 5\' 6"  (1.676 m)    Eyes: PERRL, lids and conjunctivae normal ENMT: Mucous membranes are moist. Posterior pharynx clear of any exudate or lesions.Normal dentition.  Neck: normal, supple, no masses, no thyromegaly Respiratory: clear to auscultation bilaterally, no wheezing, no crackles. Normal respiratory effort. No accessory muscle use.  Cardiovascular: Regular rate and rhythm, no murmurs / rubs / gallops. No extremity edema. 2+ pedal pulses. No carotid bruits.  Abdomen: no tenderness, no masses palpated. No hepatosplenomegaly. Bowel sounds positive.  Musculoskeletal: no clubbing / cyanosis. No joint deformity upper and lower extremities. Good ROM, no contractures. Normal muscle tone.  Skin: no rashes, lesions, ulcers. No induration Neurologic: CN 2-12 grossly intact. Sensation intact. Strength 5/5 in all 4.  Psychiatric: Normal judgment and insight. Alert and oriented x 3. Normal mood.   Labs on Admission: I have personally reviewed following labs and imaging studies  CBC:  Recent Labs Lab 09/05/17 1630  WBC 8.9  HGB 6.7*  HCT 19.8*  MCV 85.7  PLT 948   Basic Metabolic Panel: No results for input(s): NA, K, CL, CO2, GLUCOSE, BUN, CREATININE, CALCIUM, MG, PHOS in the last 168 hours. GFR: CrCl cannot be calculated (Patient's most recent lab result is older than the maximum 21 days allowed.). Liver Function Tests: No results for input(s): AST, ALT, ALKPHOS, BILITOT, PROT, ALBUMIN in the last 168 hours. No results for input(s): LIPASE, AMYLASE in the last 168 hours. No results for input(s): AMMONIA in the last 168 hours. Coagulation Profile: No results for input(s): INR, PROTIME in the last 168 hours. Cardiac Enzymes: No results for input(s): CKTOTAL, CKMB, CKMBINDEX, TROPONINI in the last 168 hours. BNP (last 3 results) No results for input(s):  PROBNP in the last 8760 hours. HbA1C: No results for input(s): HGBA1C in the last 72 hours. CBG: No results for input(s): GLUCAP in the last 168 hours. Lipid Profile: No results for input(s): CHOL, HDL, LDLCALC, TRIG, CHOLHDL, LDLDIRECT in the last 72 hours. Thyroid Function Tests: No results for input(s): TSH, T4TOTAL, FREET4, T3FREE, THYROIDAB in the last 72 hours. Anemia Panel: No results for input(s): VITAMINB12, FOLATE, FERRITIN, TIBC, IRON, RETICCTPCT in the last 72 hours. Urine analysis: No results found for: COLORURINE, APPEARANCEUR, LABSPEC, PHURINE, GLUCOSEU, HGBUR, BILIRUBINUR, KETONESUR, PROTEINUR, UROBILINOGEN, NITRITE, LEUKOCYTESUR  Radiological Exams on Admission: No  results found.  EKG: Independently reviewed. none  Assessment/Plan Active Problems:   Lower GI bleed  Lower GI bleed  Acute Blood Loss Anemia.  She has [place]ah [place] history of diverticulosis, suspect this may be etiology.  She was discharged 05/2014 for lower GI bleed that was thought 2/2 diverticulosis, but no scope done at that time and she was discharged after H/H stabilized.  H/H down to 6.3 in clinic from 13 last week.  Informed consent obtained for transfusion.  [ ]  GI consult [ ]  clear liquid diet [ ]  Post transfusion H/H  Discontinue aspirin  Venous stasis ulcer:  Well healing on L ankle.  Continue to monitor.    HTN: hold home amiloride-HCTZ  Hypokalemia: continue home K Hypomagnesemia: continue mag  Hx DVT (right leg 1990 and again in May 2007):  Notes she was placed on an aspirin for this. D/c aspirin  Hx breast cancer  Hx endometrial cancer: S/p treatment   Holding home MVI, vitamin D Holding home supplements  DVT prophylaxis: SCD  Code Status: full  Family Communication: daughters in room  Disposition Plan: home  Consults called: GI, [place]  Admission status: inpatient   Fayrene Helper MD [facility] [phone]  If 7PM-7AM, please contact  night-coverage [email or web address] Password [email or web address]  09/05/2017, 5:57 PM

## 2017-09-05 NOTE — Progress Notes (Signed)
CRITICAL VALUE ALERT  Critical Value:  Hemoglobin 6.7  Date & Time Notied:  09-05-17    1720  Provider Notified: C. Florene Glen  Orders Received/Actions taken: MD notified. No new orders at this time. Will continue to monitor.

## 2017-09-06 DIAGNOSIS — K922 Gastrointestinal hemorrhage, unspecified: Secondary | ICD-10-CM

## 2017-09-06 LAB — CBC
HCT: 22.5 % — ABNORMAL LOW (ref 36.0–46.0)
Hemoglobin: 7.9 g/dL — ABNORMAL LOW (ref 12.0–15.0)
MCH: 30 pg (ref 26.0–34.0)
MCHC: 35.1 g/dL (ref 30.0–36.0)
MCV: 85.6 fL (ref 78.0–100.0)
Platelets: 199 10*3/uL (ref 150–400)
RBC: 2.63 MIL/uL — ABNORMAL LOW (ref 3.87–5.11)
RDW: 15.8 % — ABNORMAL HIGH (ref 11.5–15.5)
WBC: 7.9 10*3/uL (ref 4.0–10.5)

## 2017-09-06 LAB — BPAM RBC
Blood Product Expiration Date: 201810242359
Blood Product Expiration Date: 201810242359
ISSUE DATE / TIME: 201810151956
ISSUE DATE / TIME: 201810152310
UNIT TYPE AND RH: 6200
Unit Type and Rh: 6200

## 2017-09-06 LAB — BASIC METABOLIC PANEL WITH GFR
Anion gap: 8 (ref 5–15)
BUN: 18 mg/dL (ref 6–20)
CO2: 25 mmol/L (ref 22–32)
Calcium: 8.7 mg/dL — ABNORMAL LOW (ref 8.9–10.3)
Chloride: 106 mmol/L (ref 101–111)
Creatinine, Ser: 0.96 mg/dL (ref 0.44–1.00)
GFR calc Af Amer: 59 mL/min — ABNORMAL LOW
GFR calc non Af Amer: 51 mL/min — ABNORMAL LOW
Glucose, Bld: 93 mg/dL (ref 65–99)
Potassium: 3.9 mmol/L (ref 3.5–5.1)
Sodium: 139 mmol/L (ref 135–145)

## 2017-09-06 LAB — TYPE AND SCREEN
ABO/RH(D): A POS
Antibody Screen: NEGATIVE
UNIT DIVISION: 0
UNIT DIVISION: 0

## 2017-09-06 LAB — GLUCOSE, CAPILLARY: Glucose-Capillary: 87 mg/dL (ref 65–99)

## 2017-09-06 MED ORDER — PANTOPRAZOLE SODIUM 40 MG IV SOLR
40.0000 mg | INTRAVENOUS | Status: DC
  Administered 2017-09-06 – 2017-09-08 (×3): 40 mg via INTRAVENOUS
  Filled 2017-09-06 (×3): qty 40

## 2017-09-06 NOTE — Progress Notes (Signed)
[facility] Gastroenterology Progress Note  [name] 83 y.o. [birth year]   Subjective: Feels good. No bleeding or BMs since admission. Daughter at bedside.  Objective: Vital signs: Vitals:   09/06/17 0900 09/06/17 1000  BP: (!) 118/48 (!) 97/40  Pulse: 73 69  Resp: 19 18  Temp:    SpO2: 98% 98%  T 98.2  Physical Exam: Gen: alert, no acute distress, elderly, well-nourished  HEENT: anicteric sclera CV: RRR Chest: CTA B Abd: soft, nontender, nondistended, +BS   Lab Results:  Recent Labs  09/05/17 1733 09/06/17 0347  NA 140 139  K 3.7 3.9  CL 106 106  CO2 24 25  GLUCOSE 98 93  BUN 20 18  CREATININE 1.02* 0.96  CALCIUM 9.2 8.7*    Recent Labs  09/05/17 1733  AST 16  ALT 10*  ALKPHOS 52  BILITOT 0.3  PROT 6.1*  ALBUMIN 3.3*    Recent Labs  09/05/17 1630 09/06/17 0347  WBC 8.9 7.9  HGB 6.7* 7.9*  HCT 19.8* 22.5*  MCV 85.7 85.6  PLT 266 199      Assessment/Plan: GI bleed - more likely to be diverticular in origin with negative CT scan and resolution of pain and diarrhea. Lower abdominal cramping may have been from the cathartic effect of bleeding. Hgb 7.9. Follow H/Hs. Continue conservative management. Slowly advance diet. Will f/u.   [place] C. 09/06/2017, 11:27 AM  Pager [phone]  AFTER 5 PM or on weekends please call [phone] ID: [name], female   DOB: 03/21/1925, 83 y.o.   MRN: [id number]

## 2017-09-06 NOTE — Care Management Note (Signed)
Case Management Note  Patient Details  Name: Marny Smethers MRN: [id number] Date of Birth: 27-Sep-1925  Subjective/Objective:                  Gi bleed  Action/Plan: Date:  October 16,2 018 Chart reviewed for concurrent status and case management needs.  Will continue to follow patient progress.  Discharge Planning: following for needs  Expected discharge date: [id number]  Velva Harman, BSN, [place], [place]   Expected Discharge Date:                  Expected Discharge Plan:  Home/Self Care  In-House Referral:     Discharge planning Services  CM Consult  Post Acute Care Choice:    Choice offered to:     DME Arranged:    DME Agency:     HH Arranged:    HH Agency:     Status of Service:  In process, will continue to follow  If discussed at Long Length of Stay Meetings, dates discussed:    Additional Comments:  Leeroy Cha, RN 09/06/2017, 9:00 AM

## 2017-09-06 NOTE — Progress Notes (Signed)
Patient ID: [name], female   DOB: 02/26/1925, 83 y.o.   MRN: [id number]  PROGRESS NOTE    Kenley Rettinger  KXF:[id number] DOB: 04-07-1925 DOA: 09/05/2017  PCP: Aretta Nip, MD   Brief Narrative:   83 year old female with history of breast cancer, diverticulosis, hypertension who presented with concern for lower GI bleed. Per patient's daughter, patient has had dark stools and also blood with bowel movements as well as blood without a bowel movement. No nausea or vomiting. No fevers or chills. No reports of abdominal pain. Her rectal bleeding has been present for past 2 days prior to the admission. Her hemoglobin was 6.3 on the admission. CT scan showed diverticulosis.GI has seen her in consultation   Assessment & Plan:   Principal Problem:   Lower GI bleed / Acute blood loss anemia - CT abdomen showed extensive colonic diverticulosis, without evidence of diverticulitis. No colonic wall thickening or mass is evident on CT - Patient's bleeding is likely diverticular - GI we'll hold off on colonoscopy unless there is a persistent bleed - Transfer to telemetry floor - We'll add Protonix 40 mg daily - CLD per GI - Check CBC in am  Active Problems:   Essential hypertension - BP well controlled while off of antihypertensive therapy     Acute kidney injury - Cr improved with IV fluids     DVT prophylaxis: SCD's Code Status: full code  Family Communication: daughter at the bedside this am Disposition Plan: transfer to telemetry    Consultants:   Gi, Dr. Michail Sermon   Procedures:   None   Antimicrobials:    None    Subjective: Says she feels little better since admission.   Objective: Vitals:   09/06/17 0143 09/06/17 0200 09/06/17 0300 09/06/17 0400  BP: (!) 133/48 (!) 135/54 (!) 119/41 (!) 126/53  Pulse: 74 76 70 69  Resp: 18 19 13 14   Temp: 98.4 F (36.9 C)  98.2 F (36.8 C)   TempSrc: Oral  Oral   SpO2: 97% 94% 93% 93%  Weight:      Height:         Intake/Output Summary (Last 24 hours) at 09/06/17 0837 Last data filed at 09/06/17 0400  Gross per 24 hour  Intake          1143.33 ml  Output             1025 ml  Net           118.33 ml   Filed Weights   09/05/17 1429  Weight: 85.8 kg (189 lb 2.5 oz)    Examination:  General exam: Appears calm and comfortable  Respiratory system: Clear to auscultation. Respiratory effort normal. Cardiovascular system: S1 & S2 heard, Rate controlled. No JVD Gastrointestinal system: Abdomen is nondistended, soft and nontender. No organomegaly or masses felt. Normal bowel sounds heard. Central nervous system: Alert and oriented. No focal neurological deficits. Extremities: Symmetric 5 x 5 power. No tenderness  Skin: No rashes, lesions or ulcers Psychiatry: Judgement and insight appear normal. Mood & affect appropriate.   Data Reviewed: I have personally reviewed following labs and imaging studies  CBC:  Recent Labs Lab 09/05/17 1630 09/06/17 0347  WBC 8.9 7.9  HGB 6.7* 7.9*  HCT 19.8* 22.5*  MCV 85.7 85.6  PLT 266 696   Basic Metabolic Panel:  Recent Labs Lab 09/05/17 1733 09/06/17 0347  NA 140 139  K 3.7 3.9  CL 106 106  CO2 24 25  GLUCOSE  98 93  BUN 20 18  CREATININE 1.02* 0.96  CALCIUM 9.2 8.7*   GFR: Estimated Creatinine Clearance: 43 mL/min (by C-G formula based on SCr of 0.96 mg/dL). Liver Function Tests:  Recent Labs Lab 09/05/17 1733  AST 16  ALT 10*  ALKPHOS 52  BILITOT 0.3  PROT 6.1*  ALBUMIN 3.3*   No results for input(s): LIPASE, AMYLASE in the last 168 hours. No results for input(s): AMMONIA in the last 168 hours. Coagulation Profile: No results for input(s): INR, PROTIME in the last 168 hours. Cardiac Enzymes: No results for input(s): CKTOTAL, CKMB, CKMBINDEX, TROPONINI in the last 168 hours. BNP (last 3 results) No results for input(s): PROBNP in the last 8760 hours. HbA1C: No results for input(s): HGBA1C in the last 72 hours. CBG: No  results for input(s): GLUCAP in the last 168 hours. Lipid Profile: No results for input(s): CHOL, HDL, LDLCALC, TRIG, CHOLHDL, LDLDIRECT in the last 72 hours. Thyroid Function Tests: No results for input(s): TSH, T4TOTAL, FREET4, T3FREE, THYROIDAB in the last 72 hours. Anemia Panel: No results for input(s): VITAMINB12, FOLATE, FERRITIN, TIBC, IRON, RETICCTPCT in the last 72 hours. Urine analysis: No results found for: COLORURINE, APPEARANCEUR, LABSPEC, [place], GLUCOSEU, HGBUR, BILIRUBINUR, KETONESUR, PROTEINUR, UROBILINOGEN, NITRITE, LEUKOCYTESUR Sepsis Labs: @LABRCNTIP (procalcitonin:4,lacticidven:4)    Recent Results (from the past 240 hour(s))  MRSA PCR Screening     Status: None   Collection Time: 09/05/17  2:31 PM  Result Value Ref Range Status   MRSA by PCR NEGATIVE NEGATIVE Final    Comment:        The GeneXpert MRSA Assay (FDA approved for NASAL specimens only), is one component of a comprehensive MRSA colonization surveillance program. It is not intended to diagnose MRSA infection nor to guide or monitor treatment for MRSA infections.       Radiology Studies: Ct Abdomen Pelvis W Contrast  Result Date: 09/06/2017 CLINICAL DATA:  Diverticulosis, concern for lower GI bleed EXAM: CT ABDOMEN AND PELVIS WITH CONTRAST TECHNIQUE: Multidetector CT imaging of the abdomen and pelvis was performed using the standard protocol following bolus administration of intravenous contrast. CONTRAST:  148mL ISOVUE-300 IOPAMIDOL (ISOVUE-300) INJECTION 61% COMPARISON:  06/18/2014 FINDINGS: Lower chest: Mild subpleural reticulation/ fibrosis at the lung bases. Hepatobiliary: Liver is within normal limits. 1.7 cm laminated gallstone (series 2/ image 46), without associated inflammatory changes. No intrahepatic or extrahepatic ductal dilatation. Pancreas: Within normal limits. Spleen: Within normal limits. Adrenals/Urinary Tract: Adrenal glands are within normal limits. 1.6 cm right lower pole  renal cyst (series 2/ image 43). Multiple left renal cysts measuring up to 1.8 cm in the medial left upper pole (series 2/ image 22). No hydronephrosis. Bladder is within normal limits. Stomach/Bowel: Stomach is within normal limits. No evidence of bowel obstruction. Normal appendix (series 2/image 65). Extensive colonic diverticulosis, without evidence of diverticulitis. No colonic wall thickening or mass is evident on CT. Vascular/Lymphatic: No evidence of abdominal aortic aneurysm. Atherosclerotic calcifications of the abdominal aorta and branch vessels. No suspicious abdominopelvic lymphadenopathy. Reproductive: Status post hysterectomy. No adnexal masses. Other: No abdominopelvic ascites. Tiny fat containing periumbilical hernia (series 2/ image 45). Musculoskeletal: Degenerative changes of the visualized thoracolumbar spine. IMPRESSION: Extensive colonic diverticulosis, without evidence of diverticulitis. No colonic wall thickening or mass is evident on CT. Multiple bilateral renal cysts, measuring up to 1.8 cm in the medial left upper pole, benign (Bosniak I). Cholelithiasis, without associated inflammatory changes. Additional ancillary findings as above. Electronically Signed   By: Henderson Newcomer.D.  On: 09/06/2017 08:27      Scheduled Meds: . potassium chloride  10 mEq Oral Daily  . traZODone  25 mg Oral QHS   Continuous Infusions:   LOS: 1 day    Time spent: 25 minutes  Greater than 50% of the time spent on counseling and coordinating the care.   Leisa Lenz, MD [facility] Pager [phone]  If 7PM-7AM, please contact night-coverage [email or web address] Password [facility] 09/06/2017, 8:37 AM

## 2017-09-07 LAB — CBC
HCT: 24.4 % — ABNORMAL LOW (ref 36.0–46.0)
Hemoglobin: 8.2 g/dL — ABNORMAL LOW (ref 12.0–15.0)
MCH: 28.9 pg (ref 26.0–34.0)
MCHC: 33.6 g/dL (ref 30.0–36.0)
MCV: 85.9 fL (ref 78.0–100.0)
PLATELETS: 257 10*3/uL (ref 150–400)
RBC: 2.84 MIL/uL — AB (ref 3.87–5.11)
RDW: 16.7 % — ABNORMAL HIGH (ref 11.5–15.5)
WBC: 8 10*3/uL (ref 4.0–10.5)

## 2017-09-07 MED ORDER — ACETAMINOPHEN 325 MG PO TABS
650.0000 mg | ORAL_TABLET | Freq: Four times a day (QID) | ORAL | Status: DC | PRN
  Administered 2017-09-07: 650 mg via ORAL
  Filled 2017-09-07: qty 2

## 2017-09-07 NOTE — Progress Notes (Signed)
[facility] Gastroenterology Progress Note  [name] 83 y.o. 1925-08-08   Subjective: Feels good. Denies bleeding. Tolerating diet. Sitting in bedside chair. Multiple family members in room.  Objective: Vital signs: Vitals:   09/07/17 0915 09/07/17 1445  BP: 118/65 (!) 107/54  Pulse: 79 84  Resp: 16 18  Temp: 97.9 F (36.6 C) 98.2 F (36.8 C)  SpO2: 99% 97%    Physical Exam: Gen: alert, no acute distress, elderly HEENT: anicteric sclera CV: RRR Chest: CTA B Abd: soft, nontender, nondistended, +BS  Lab Results:  Recent Labs  09/05/17 1733 09/06/17 0347  NA 140 139  K 3.7 3.9  CL 106 106  CO2 24 25  GLUCOSE 98 93  BUN 20 18  CREATININE 1.02* 0.96  CALCIUM 9.2 8.7*    Recent Labs  09/05/17 1733  AST 16  ALT 10*  ALKPHOS 52  BILITOT 0.3  PROT 6.1*  ALBUMIN 3.3*    Recent Labs  09/06/17 0347 09/07/17 0610  WBC 7.9 8.0  HGB 7.9* 8.2*  HCT 22.5* 24.4*  MCV 85.6 85.9  PLT 199 257      Assessment/Plan: Resolved GI bleed - likely diverticular. Hgb stable. No plans for colonoscopy due to advanced age. Ok to be discharged tomorrow if tolerating diet. F/U with GI prn. Will sign off. Call if questions.   Chilton C. 09/07/2017, 4:10 PM  Pager [phone]  AFTER 5 PM or on weekends please call [phone] ID: [name], female   DOB: 08-16-1925, 82 y.o.   MRN: [id number]

## 2017-09-07 NOTE — Progress Notes (Signed)
Patient ID: [name], female   DOB: 16-Aug-1925, 83 y.o.   MRN: [id number]  PROGRESS NOTE    [name]  JEH:[id number] DOB: 1925-09-24 DOA: 09/05/2017  PCP: Aretta Nip, MD   Brief Narrative:   83 year old female with history of breast cancer, diverticulosis, hypertension who presented with concern for lower GI bleed. Per patient's daughter, patient has had dark stools and also blood with bowel movements as well as blood without a bowel movement. No nausea or vomiting. No fevers or chills. No reports of abdominal pain. Her rectal bleeding has been present for past 2 days prior to the admission. Her hemoglobin was 6.3 on the admission. CT scan showed diverticulosis.GI has seen her in consultation   Assessment & Plan:   Principal Problem:   Lower GI bleed / Acute blood loss anemia - CT abdomen showed extensive colonic diverticulosis, without evidence of diverticulitis. No colonic wall thickening or mass is evident on CT - Patient's bleeding is likely diverticular - no plan for  colonoscopy per gi - Transfer to telemetry floor - continue Protonix 40 mg daily - CLD per GI - Check CBC in am has been stable  Active Problems:   Essential hypertension - BP well controlled while off of antihypertensive therapy     Acute kidney injury - Cr improved with IV fluids     DVT prophylaxis: SCD's Code Status: full code  Family Communication: daughter at the bedside this am Disposition Plan: home in am if continue to be stable   Consultants:   Gi, Dr. Michail Sermon   Procedures:   None   Antimicrobials:    None    Subjective: She is upset that she saw blood streak in stool last night, currently no active bleed, no fever, no pain, family at bedside  Objective: Vitals:   09/06/17 1743 09/07/17 0504 09/07/17 0915 09/07/17 1445  BP: (!) 117/50 (!) 118/49 118/65 (!) 107/54  Pulse: 74 71 79 84  Resp: 20 18 16 18   Temp: 98.7 F (37.1 C) 98 F (36.7 C) 97.9 F  (36.6 C) 98.2 F (36.8 C)  TempSrc: Oral Oral Oral Oral  SpO2: 100% 98% 99% 97%  Weight:      Height:        Intake/Output Summary (Last 24 hours) at 09/07/17 1829 Last data filed at 09/07/17 1211  Gross per 24 hour  Intake              360 ml  Output                0 ml  Net              360 ml   Filed Weights   09/05/17 1429  Weight: 85.8 kg (189 lb 2.5 oz)    Examination:  General exam: Appears calm and comfortable  Respiratory system: Clear to auscultation. Respiratory effort normal. Cardiovascular system: S1 & S2 heard, Rate controlled. No JVD Gastrointestinal system: Abdomen is nondistended, soft and nontender. No organomegaly or masses felt. Normal bowel sounds heard. Central nervous system: Alert and oriented. No focal neurological deficits. Extremities: Symmetric 5 x 5 power. No tenderness  Skin: No rashes, lesions or ulcers Psychiatry: Judgement and insight appear normal. Mood & affect appropriate.   Data Reviewed: I have personally reviewed following labs and imaging studies  CBC:  Recent Labs Lab 09/05/17 1630 09/06/17 0347 09/07/17 0610  WBC 8.9 7.9 8.0  HGB 6.7* 7.9* 8.2*  HCT 19.8* 22.5* 24.4*  MCV 85.7  85.6 85.9  PLT 266 199 694   Basic Metabolic Panel:  Recent Labs Lab 09/05/17 1733 09/06/17 0347  NA 140 139  K 3.7 3.9  CL 106 106  CO2 24 25  GLUCOSE 98 93  BUN 20 18  CREATININE 1.02* 0.96  CALCIUM 9.2 8.7*   GFR: Estimated Creatinine Clearance: 43 mL/min (by C-G formula based on SCr of 0.96 mg/dL). Liver Function Tests:  Recent Labs Lab 09/05/17 1733  AST 16  ALT 10*  ALKPHOS 52  BILITOT 0.3  PROT 6.1*  ALBUMIN 3.3*   No results for input(s): LIPASE, AMYLASE in the last 168 hours. No results for input(s): AMMONIA in the last 168 hours. Coagulation Profile: No results for input(s): INR, PROTIME in the last 168 hours. Cardiac Enzymes: No results for input(s): CKTOTAL, CKMB, CKMBINDEX, TROPONINI in the last 168  hours. BNP (last 3 results) No results for input(s): PROBNP in the last 8760 hours. HbA1C: No results for input(s): HGBA1C in the last 72 hours. CBG:  Recent Labs Lab 09/06/17 2154  GLUCAP 87   Lipid Profile: No results for input(s): CHOL, HDL, LDLCALC, TRIG, CHOLHDL, LDLDIRECT in the last 72 hours. Thyroid Function Tests: No results for input(s): TSH, T4TOTAL, FREET4, T3FREE, THYROIDAB in the last 72 hours. Anemia Panel: No results for input(s): VITAMINB12, FOLATE, FERRITIN, TIBC, IRON, RETICCTPCT in the last 72 hours. Urine analysis: No results found for: COLORURINE, APPEARANCEUR, LABSPEC, [place], GLUCOSEU, HGBUR, BILIRUBINUR, KETONESUR, PROTEINUR, UROBILINOGEN, NITRITE, LEUKOCYTESUR Sepsis Labs: @LABRCNTIP (procalcitonin:4,lacticidven:4)    Recent Results (from the past 240 hour(s))  MRSA PCR Screening     Status: None   Collection Time: 09/05/17  2:31 PM  Result Value Ref Range Status   MRSA by PCR NEGATIVE NEGATIVE Final    Comment:        The GeneXpert MRSA Assay (FDA approved for NASAL specimens only), is one component of a comprehensive MRSA colonization surveillance program. It is not intended to diagnose MRSA infection nor to guide or monitor treatment for MRSA infections.       Radiology Studies: Ct Abdomen Pelvis W Contrast  Result Date: 09/06/2017 CLINICAL DATA:  Diverticulosis, concern for lower GI bleed EXAM: CT ABDOMEN AND PELVIS WITH CONTRAST TECHNIQUE: Multidetector CT imaging of the abdomen and pelvis was performed using the standard protocol following bolus administration of intravenous contrast. CONTRAST:  132mL ISOVUE-300 IOPAMIDOL (ISOVUE-300) INJECTION 61% COMPARISON:  06/18/2014 FINDINGS: Lower chest: Mild subpleural reticulation/ fibrosis at the lung bases. Hepatobiliary: Liver is within normal limits. 1.7 cm laminated gallstone (series 2/ image 46), without associated inflammatory changes. No intrahepatic or extrahepatic ductal dilatation.  Pancreas: Within normal limits. Spleen: Within normal limits. Adrenals/Urinary Tract: Adrenal glands are within normal limits. 1.6 cm right lower pole renal cyst (series 2/ image 43). Multiple left renal cysts measuring up to 1.8 cm in the medial left upper pole (series 2/ image 22). No hydronephrosis. Bladder is within normal limits. Stomach/Bowel: Stomach is within normal limits. No evidence of bowel obstruction. Normal appendix (series 2/image 65). Extensive colonic diverticulosis, without evidence of diverticulitis. No colonic wall thickening or mass is evident on CT. Vascular/Lymphatic: No evidence of abdominal aortic aneurysm. Atherosclerotic calcifications of the abdominal aorta and branch vessels. No suspicious abdominopelvic lymphadenopathy. Reproductive: Status post hysterectomy. No adnexal masses. Other: No abdominopelvic ascites. Tiny fat containing periumbilical hernia (series 2/ image 1). Musculoskeletal: Degenerative changes of the visualized thoracolumbar spine. IMPRESSION: Extensive colonic diverticulosis, without evidence of diverticulitis. No colonic wall thickening or mass is evident on CT.  Multiple bilateral renal cysts, measuring up to 1.8 cm in the medial left upper pole, benign (Bosniak I). Cholelithiasis, without associated inflammatory changes. Additional ancillary findings as above. Electronically Signed   By: Julian Hy M.D.   On: 09/06/2017 08:27      Scheduled Meds: . pantoprazole (PROTONIX) IV  40 mg Intravenous Q24H  . potassium chloride  10 mEq Oral Daily  . traZODone  25 mg Oral QHS   Continuous Infusions:   LOS: 2 days    Time spent: 25 minutes  Greater than 50% of the time spent on counseling and coordinating the care. Case discussed with patient/daughter/RN at bedside Case discussed with Adair Patter, MD PhD [facility] Pager [phone]  If 7PM-7AM, please contact night-coverage [email or web address] Password [facility] 09/07/2017, 6:29 PM

## 2017-09-08 MED ORDER — PANTOPRAZOLE SODIUM 40 MG PO TBEC: 40.0000 mg | DELAYED_RELEASE_TABLET | Freq: Every day | ORAL | 0 refills | Status: DC

## 2017-09-08 MED ORDER — PANTOPRAZOLE SODIUM 40 MG PO TBEC: 40.0000 mg | DELAYED_RELEASE_TABLET | Freq: Every day | ORAL | Status: DC

## 2017-09-08 NOTE — Progress Notes (Signed)
[place], [place] with [facility].  No other needs from case management noted.

## 2017-09-08 NOTE — Discharge Summary (Signed)
Discharge Summary  [name] JME:[id number] DOB: December 25, 1924  PCP: Aretta Nip, MD  Admit date: 09/05/2017 Discharge date: 09/08/2017  Time spent: <23mins  Recommendations for Outpatient Follow-up:  1. F/u with PMD within a week  for hospital discharge follow up, repeat cbc/bmp at follow up 2. F/u with [facility] as needed  Discharge Diagnoses:  Active Hospital Problems   Diagnosis Date Noted  . Lower GI bleed 09/05/2017  . Essential hypertension 06/18/2014  . Acute lower GI bleeding 06/18/2014  . Breast cancer ([place])   . Hx of radiation therapy     Resolved Hospital Problems   Diagnosis Date Noted Date Resolved  No resolved problems to display.    Discharge Condition: stable  Diet recommendation: heart healthy  Filed Weights   09/05/17 1429  Weight: 85.8 kg (189 lb 2.5 oz)    History of present illness:  PCP: Rankins, Bill Salinas, MD  Patient coming from: home  I have personally briefly reviewed patient's old medical records in [place]  Chief Complaint:  Abnormal labs  HPI: [name] is a 83 y.o. female with medical history significant of breast cancer, diverticulosis, HTN, here with concern for a lower GI bleed.    Patient was directly admitted to the [facility] stepdown unit from clinic. For the past week she's noticed dark red bowel movements. She notes that she has about 2x a day.  She denies melena. She denies nausea or vomiting. The only other symptom she notes is lightheadedness.  She denies chest pain, shortness of breath, and abdominal pain.   Hospital Course:  Principal Problem:   Lower GI bleed Active Problems:   Hx of radiation therapy   Breast cancer ([place])   Essential hypertension   Acute lower GI bleeding   Principal Problem:   Lower GI bleed / Acute blood loss anemia - CT abdomen showed extensive colonic diverticulosis, without evidence of diverticulitis. No colonic wall thickening or mass is evident on  CT - Patient's bleeding is likely diverticular, no pain,  -hgb 6.7 on admission, she received two units of prbc on 10/15, hgb 8.2 at discharge - no plan for  colonoscopy per gi - she is discharged on Protonix 40 mg daily - cbc has been stable, she is cleared to discharge home by gi     Essential hypertension - on amiloride/hctz at home , this was held in the hospital, BP well controlled while off of antihypertensive therapy  -home meds resumed at discharge    Acute kidney injury -cr 1.02 on admission, she received gentle hydration, home diuretic held, cr improved, home diuretic resumed at discharge - pmd to repeat bmp at follow up. Adjust diuresis as needed    DVT prophylaxis: SCD's Code Status: full code  Family Communication: daughter at the bedside this am Disposition Plan: home with GI clearance   Consultants:   [facility], Dr. Michail Sermon   Procedures:   None   Antimicrobials:    None    Discharge Exam: BP 120/66 (BP Location: Left Arm)   Pulse 70   Temp 98.2 F (36.8 C) (Oral)   Resp 18   Ht 5\' 6"  (1.676 m)   Wt 85.8 kg (189 lb 2.5 oz)   SpO2 98%   BMI 30.53 kg/m   General: NAD, appear younger than stated age, she is not oriented to time Cardiovascular: RRR Respiratory: CTABL Ab: nontender, +BS Extremity: no edema  Discharge Instructions You were cared for by a hospitalist during your  hospital stay. If you have any questions about your discharge medications or the care you received while you were in the hospital after you are discharged, you can call the unit and asked to speak with the hospitalist on call if the hospitalist that took care of you is not available. Once you are discharged, your primary care physician will handle any further medical issues. Please note that NO REFILLS for any discharge medications will be authorized once you are discharged, as it is imperative that you return to your primary care physician (or establish a relationship  with a primary care physician if you do not have one) for your aftercare needs so that they can reassess your need for medications and monitor your lab values.  Discharge Instructions    Diet - low sodium heart healthy    Complete by:  As directed    Face-to-face encounter (required for Medicare/Medicaid patients)    Complete by:  As directed    I Gualberto Wahlen certify that this patient is under my care and that I, or a nurse practitioner or physician's assistant working with me, had a face-to-face encounter that meets the physician face-to-face encounter requirements with this patient on 09/08/2017. The encounter with the patient was in whole, or in part for the following medical condition(s) which is the primary reason for home health care (List medical condition): FTT   The encounter with the patient was in whole, or in part, for the following medical condition, which is the primary reason for home health care:  FTT   I certify that, based on my findings, the following services are medically necessary home health services:  Physical therapy   Reason for Medically Necessary Home Health Services:  Therapy- Personnel officer, Public librarian   My clinical findings support the need for the above services:  Unable to leave home safely without assistance and/or assistive device   Further, I certify that my clinical findings support that this patient is homebound due to:  Unable to leave home safely without assistance   Home Health    Complete by:  As directed    To provide the following care/treatments:  PT   Increase activity slowly    Complete by:  As directed      Allergies as of 09/08/2017   No Known Allergies     Medication List    TAKE these medications   amiloride-hydrochlorothiazide 5-50 MG tablet Commonly known as:  MODURETIC Take 0.5 tablets by mouth daily.   CENTRUM SILVER PO Take 1 tablet by mouth daily.   Fish Oil 1000 MG Caps Take 1 capsule by mouth daily.    [place] 300 MG Caps Take 1 capsule by mouth daily.   KLOR-CON M10 10 MEQ tablet Generic drug:  potassium chloride Take 10 mEq by mouth daily.   Magnesium 250 MG Tabs Take 1 tablet by mouth daily.   pantoprazole 40 MG tablet Commonly known as:  PROTONIX Take 1 tablet (40 mg total) by mouth daily.   traZODone 50 MG tablet Commonly known as:  DESYREL Take 25 mg by mouth at bedtime. TAKES 1/2 TABLET   vitamin C 500 MG tablet Commonly known as:  ASCORBIC ACID Take 500 mg by mouth daily.   Vitamin D3 1000 units Caps Take 1,000 Units by mouth daily.      No Known Allergies Follow-up Information    Rankins, Bill Salinas, MD Follow up in 1 week(s).   Specialty:  Family Medicine Why:  hospital discharge follow up, repeat cbc/bmp at follow up. Contact information: [place] [address] [phone]        Wilford Corner, MD Follow up.   Specialty:  Gastroenterology Why:  as needed Contact information: [address] [place] [address] [phone]            The results of significant diagnostics from this hospitalization (including imaging, microbiology, ancillary and laboratory) are listed below for reference.    Significant Diagnostic Studies: Ct Abdomen Pelvis W Contrast  Result Date: 09/06/2017 CLINICAL DATA:  Diverticulosis, concern for lower GI bleed EXAM: CT ABDOMEN AND PELVIS WITH CONTRAST TECHNIQUE: Multidetector CT imaging of the abdomen and pelvis was performed using the standard protocol following bolus administration of intravenous contrast. CONTRAST:  121mL ISOVUE-300 IOPAMIDOL (ISOVUE-300) INJECTION 61% COMPARISON:  06/18/2014 FINDINGS: Lower chest: Mild subpleural reticulation/ fibrosis at the lung bases. Hepatobiliary: Liver is within normal limits. 1.7 cm laminated gallstone (series 2/ image 46), without associated inflammatory changes. No intrahepatic or extrahepatic ductal dilatation. Pancreas: Within  normal limits. Spleen: Within normal limits. Adrenals/Urinary Tract: Adrenal glands are within normal limits. 1.6 cm right lower pole renal cyst (series 2/ image 43). Multiple left renal cysts measuring up to 1.8 cm in the medial left upper pole (series 2/ image 22). No hydronephrosis. Bladder is within normal limits. Stomach/Bowel: Stomach is within normal limits. No evidence of bowel obstruction. Normal appendix (series 2/image 65). Extensive colonic diverticulosis, without evidence of diverticulitis. No colonic wall thickening or mass is evident on CT. Vascular/Lymphatic: No evidence of abdominal aortic aneurysm. Atherosclerotic calcifications of the abdominal aorta and branch vessels. No suspicious abdominopelvic lymphadenopathy. Reproductive: Status post hysterectomy. No adnexal masses. Other: No abdominopelvic ascites. Tiny fat containing periumbilical hernia (series 2/ image 55). Musculoskeletal: Degenerative changes of the visualized thoracolumbar spine. IMPRESSION: Extensive colonic diverticulosis, without evidence of diverticulitis. No colonic wall thickening or mass is evident on CT. Multiple bilateral renal cysts, measuring up to 1.8 cm in the medial left upper pole, benign (Bosniak I). Cholelithiasis, without associated inflammatory changes. Additional ancillary findings as above. Electronically Signed   By: Julian Hy M.D.   On: 09/06/2017 08:27    Microbiology: Recent Results (from the past 240 hour(s))  MRSA PCR Screening     Status: None   Collection Time: 09/05/17  2:31 PM  Result Value Ref Range Status   MRSA by PCR NEGATIVE NEGATIVE Final    Comment:        The GeneXpert MRSA Assay (FDA approved for NASAL specimens only), is one component of a comprehensive MRSA colonization surveillance program. It is not intended to diagnose MRSA infection nor to guide or monitor treatment for MRSA infections.      Labs: Basic Metabolic Panel:  Recent Labs Lab 09/05/17 1733  09/06/17 0347  NA 140 139  K 3.7 3.9  CL 106 106  CO2 24 25  GLUCOSE 98 93  BUN 20 18  CREATININE 1.02* 0.96  CALCIUM 9.2 8.7*   Liver Function Tests:  Recent Labs Lab 09/05/17 1733  AST 16  ALT 10*  ALKPHOS 52  BILITOT 0.3  PROT 6.1*  ALBUMIN 3.3*   No results for input(s): LIPASE, AMYLASE in the last 168 hours. No results for input(s): AMMONIA in the last 168 hours. CBC:  Recent Labs Lab 09/05/17 1630 09/06/17 0347 09/07/17 0610  WBC 8.9 7.9 8.0  HGB 6.7* 7.9* 8.2*  HCT 19.8* 22.5* 24.4*  MCV 85.7 85.6 85.9  PLT 266 199 257   Cardiac Enzymes: No results for input(s): CKTOTAL, CKMB, CKMBINDEX, TROPONINI in the last 168 hours. BNP: BNP (last 3 results) No results for input(s): BNP in the last 8760 hours.  ProBNP (last 3 results) No results for input(s): PROBNP in the last 8760 hours.  CBG:  Recent Labs Lab 09/06/17 2154  GLUCAP 87       Signed:  Lutricia Widjaja MD, PhD  [facility] 09/08/2017, 11:46 AM

## 2017-09-08 NOTE — Care Management Important Message (Signed)
Important Message  Patient Details  Name: Ahliyah Nienow MRN: [id number] Date of Birth: [birth year]   Medicare Important Message Given:  Yes    Kerin Salen 09/08/2017, 9:56 AMImportant Message  Patient Details  Name: Denetta Fei MRN: [id number] Date of Birth: 1925-09-13   Medicare Important Message Given:  Yes    Kerin Salen 09/08/2017, 9:56 AM

## 2017-09-08 NOTE — Evaluation (Signed)
Physical Therapy Evaluation Patient Details Name: [name] MRN: [id number] DOB: June 14, 1925 Today's Date: 09/08/2017   History of Present Illness  83 yo female admitted with lower GI bleed, anemia. Hx of breast cancer, DVT, venous stasis, endometrial adenocarcinoma    Clinical Impression  On eval, pt required Min guard-Min assist for mobility. She walked ~85 feet with a quad cane. Mildly unsteady during session. Pt presents with general weakness. Discussed d/c plan-pt plans to return home with family. She declines HHPT f/u at this time. Will follow and progress activity as tolerated.     Follow Up Recommendations Home health PT;Supervision/Assistance - 24 hour (pt declines f/u)    Equipment Recommendations  None recommended by PT    Recommendations for Other Services       Precautions / Restrictions Precautions Precautions: Fall Restrictions Weight Bearing Restrictions: No      Mobility  Bed Mobility Overal bed mobility: Needs Assistance Bed Mobility: Supine to Sit;Sit to Supine     Supine to sit: Supervision;HOB elevated Sit to supine: Supervision;HOB elevated   General bed mobility comments: for safety. increased time  Transfers Overall transfer level: Needs assistance Equipment used: Rolling walker (2 wheeled) Transfers: Sit to/from Stand Sit to Stand: Min guard         General transfer comment: close guard for safety.   Ambulation/Gait Ambulation/Gait assistance: Min assist;Min guard Ambulation Distance (Feet): 85 Feet Assistive device: Quad cane Gait Pattern/deviations: Step-through pattern;Decreased stride length;Antalgic     General Gait Details: Gait appeared antalgic at times but pt denied pain. Intermittent assist to stabilize due to mild unsteadiness. Pt tolerated distance well  Stairs            Wheelchair Mobility    Modified Rankin (Stroke Patients Only)       Balance Overall balance assessment: Needs assistance         Standing balance support: Single extremity supported Standing balance-Leahy Scale: Fair                               Pertinent Vitals/Pain Pain Assessment: No/denies pain    Home Living Family/patient expects to be discharged to:: Private residence Living Arrangements: Children Available Help at Discharge: Family Type of Home: House Home Access: Stairs to enter   Technical brewer of Steps: 2 Home Layout: One level Home Equipment: Cane - quad      Prior Function Level of Independence: Independent with assistive device(s)         Comments: uses quad cane for ambulation     Hand Dominance        Extremity/Trunk Assessment   Upper Extremity Assessment Upper Extremity Assessment: Generalized weakness    Lower Extremity Assessment Lower Extremity Assessment: Generalized weakness    Cervical / Trunk Assessment Cervical / Trunk Assessment: Normal  Communication   Communication: No difficulties  Cognition Arousal/Alertness: Awake/alert Behavior During Therapy: WFL for tasks assessed/performed Overall Cognitive Status: Within Functional Limits for tasks assessed                                        General Comments      Exercises     Assessment/Plan    PT Assessment Patient needs continued PT services  PT Problem List Decreased strength;Decreased mobility;Decreased activity tolerance;Decreased balance       PT Treatment Interventions DME instruction;Gait training;Therapeutic  activities;Therapeutic exercise;Functional mobility training;Balance training;Patient/family education    PT Goals (Current goals can be found in the Care Plan section)  Acute Rehab PT Goals Patient Stated Goal: home PT Goal Formulation: With patient Time For Goal Achievement: 09/22/17 Potential to Achieve Goals: Good    Frequency Min 3X/week   Barriers to discharge        Co-evaluation               AM-PAC PT "6 Clicks" Daily  Activity  Outcome Measure Difficulty turning over in bed (including adjusting bedclothes, sheets and blankets)?: A Little Difficulty moving from lying on back to sitting on the side of the bed? : A Little Difficulty sitting down on and standing up from a chair with arms (e.g., wheelchair, bedside commode, etc,.)?: A Little Help needed moving to and from a bed to chair (including a wheelchair)?: A Little Help needed walking in hospital room?: A Little Help needed climbing 3-5 steps with a railing? : A Little 6 Click Score: 18    End of Session Equipment Utilized During Treatment: Gait belt Activity Tolerance: Patient tolerated treatment well Patient left: in bed;with call bell/phone within reach;with family/visitor present   PT Visit Diagnosis: Muscle weakness (generalized) (M62.81);Difficulty in walking, not elsewhere classified (R26.2)    Time: 1020-1036 PT Time Calculation (min) (ACUTE ONLY): 16 min   Charges:   PT Evaluation $PT Eval Low Complexity: 1 Low     PT G Codes:         Weston Anna, MPT Pager: [phone]

## 2017-09-14 ENCOUNTER — Other Ambulatory Visit: Payer: Self-pay | Admitting: Gastroenterology

## 2017-09-15 ENCOUNTER — Encounter (HOSPITAL_COMMUNITY): Payer: Self-pay | Admitting: *Deleted

## 2017-09-16 ENCOUNTER — Ambulatory Visit (HOSPITAL_COMMUNITY)
Admission: RE | Admit: 2017-09-16 | Discharge: 2017-09-16 | Disposition: A | Discharge status: Home / Self Care | Payer: Medicare Other | Source: Ambulatory Visit | Attending: Gastroenterology | Admitting: Gastroenterology

## 2017-09-16 ENCOUNTER — Ambulatory Visit (HOSPITAL_COMMUNITY): Payer: Medicare Other | Admitting: Certified Registered Nurse Anesthetist

## 2017-09-16 ENCOUNTER — Encounter (HOSPITAL_COMMUNITY): Payer: Self-pay | Admitting: *Deleted

## 2017-09-16 ENCOUNTER — Encounter (HOSPITAL_COMMUNITY)
Admission: RE | Disposition: A | Discharge status: Home / Self Care | Payer: Self-pay | Source: Ambulatory Visit | Attending: Gastroenterology

## 2017-09-16 DIAGNOSIS — D649 Anemia, unspecified: Secondary | ICD-10-CM | POA: Insufficient documentation

## 2017-09-16 DIAGNOSIS — Z800 Family history of malignant neoplasm of digestive organs: Secondary | ICD-10-CM | POA: Insufficient documentation

## 2017-09-16 DIAGNOSIS — Q438 Other specified congenital malformations of intestine: Secondary | ICD-10-CM | POA: Diagnosis not present

## 2017-09-16 DIAGNOSIS — K640 First degree hemorrhoids: Secondary | ICD-10-CM | POA: Insufficient documentation

## 2017-09-16 DIAGNOSIS — I10 Essential (primary) hypertension: Secondary | ICD-10-CM | POA: Diagnosis not present

## 2017-09-16 DIAGNOSIS — Z79899 Other long term (current) drug therapy: Secondary | ICD-10-CM | POA: Insufficient documentation

## 2017-09-16 DIAGNOSIS — K5730 Diverticulosis of large intestine without perforation or abscess without bleeding: Secondary | ICD-10-CM | POA: Insufficient documentation

## 2017-09-16 DIAGNOSIS — K644 Residual hemorrhoidal skin tags: Secondary | ICD-10-CM | POA: Insufficient documentation

## 2017-09-16 DIAGNOSIS — Z853 Personal history of malignant neoplasm of breast: Secondary | ICD-10-CM | POA: Insufficient documentation

## 2017-09-16 DIAGNOSIS — K219 Gastro-esophageal reflux disease without esophagitis: Secondary | ICD-10-CM | POA: Diagnosis not present

## 2017-09-16 DIAGNOSIS — Z923 Personal history of irradiation: Secondary | ICD-10-CM | POA: Diagnosis not present

## 2017-09-16 DIAGNOSIS — F419 Anxiety disorder, unspecified: Secondary | ICD-10-CM | POA: Diagnosis not present

## 2017-09-16 DIAGNOSIS — Z8542 Personal history of malignant neoplasm of other parts of uterus: Secondary | ICD-10-CM | POA: Insufficient documentation

## 2017-09-16 DIAGNOSIS — K921 Melena: Secondary | ICD-10-CM | POA: Diagnosis present

## 2017-09-16 HISTORY — PX: COLONOSCOPY WITH PROPOFOL: SHX5780

## 2017-09-16 HISTORY — DX: Peripheral vascular disease, unspecified: I73.9

## 2017-09-16 HISTORY — DX: Gastro-esophageal reflux disease without esophagitis: K21.9

## 2017-09-16 HISTORY — DX: Personal history of other medical treatment: Z92.89

## 2017-09-16 SURGERY — COLONOSCOPY WITH PROPOFOL
Anesthesia: Monitor Anesthesia Care

## 2017-09-16 MED ORDER — SODIUM CHLORIDE 0.9 % IV SOLN: INTRAVENOUS | Status: DC

## 2017-09-16 MED ORDER — PROPOFOL 10 MG/ML IV BOLUS
INTRAVENOUS | Status: DC | PRN
  Administered 2017-09-16: 20 mg via INTRAVENOUS
  Administered 2017-09-16: 10 mg via INTRAVENOUS
  Administered 2017-09-16: 20 mg via INTRAVENOUS

## 2017-09-16 MED ORDER — PROPOFOL 500 MG/50ML IV EMUL
INTRAVENOUS | Status: DC | PRN
  Administered 2017-09-16: 65 ug/kg/min via INTRAVENOUS

## 2017-09-16 MED ORDER — LACTATED RINGERS IV SOLN
INTRAVENOUS | Status: DC | PRN
  Administered 2017-09-16: 13:00:00 via INTRAVENOUS

## 2017-09-16 SURGICAL SUPPLY — 22 items

## 2017-09-16 NOTE — Discharge Instructions (Signed)

## 2017-09-16 NOTE — H&P (Signed)
Date of Initial H&P: 09/14/17  History reviewed, patient examined, no change in status, stable for surgery.

## 2017-09-16 NOTE — Op Note (Addendum)
[facility] Patient Name: [name] Procedure Date : 09/16/2017 MRN: [id number] Attending MD: Lear Ng , MD Date of Birth: [birth year] CSN: [id number] Age: 83 Admit Type: Outpatient Procedure:                Colonoscopy Indications:              Evaluation of unexplained GI bleeding, This is the                            patient's first colonoscopy, Hematochezia, Rectal                            bleeding Providers:                Lear Ng, MD, Cleda Daub, RN, Corliss Parish, Technician Referring MD:              Medicines:                Propofol per Anesthesia, Monitored Anesthesia Care Complications:            No immediate complications. Estimated Blood Loss:     Estimated blood loss was minimal. Procedure:                Pre-Anesthesia Assessment:                           - Prior to the procedure, a History and Physical                            was performed, and patient medications and                            allergies were reviewed. The patient's tolerance of                            previous anesthesia was also reviewed. The risks                            and benefits of the procedure and the sedation                            options and risks were discussed with the patient.                            All questions were answered, and informed consent                            was obtained. Prior Anticoagulants: The patient has                            taken no previous anticoagulant or antiplatelet  agents. ASA Grade Assessment: III - A patient with                            severe systemic disease. After reviewing the risks                            and benefits, the patient was deemed in                            satisfactory condition to undergo the procedure.                           After obtaining informed consent, the colonoscope        was passed under direct vision. Throughout the                            procedure, the patient's blood pressure, pulse, and                            oxygen saturations were monitored continuously. The                            EC-[id number] ([id number]) scope was introduced through                            the anus and advanced to the the cecum, identified                            by appendiceal orifice and ileocecal valve. The                            colonoscopy was somewhat difficult due to                            significant looping and a tortuous colon.                            Successful completion of the procedure was aided by                            straightening and shortening the scope to obtain                            bowel loop reduction. The patient tolerated the                            procedure well. The quality of the bowel                            preparation was fair and fair but repeated                            irrigation led to a good and adequate prep. The  ileocecal valve, appendiceal orifice, and rectum                            were photographed. Scope In: 1:23:03 PM Scope Out: 1:36:41 PM Scope Withdrawal Time: 0 hours 7 minutes 22 seconds  Total Procedure Duration: 0 hours 13 minutes 38 seconds  Findings:      Multiple small and large-mouthed diverticula were found in the entire       colon.      Internal hemorrhoids were found during retroflexion. The hemorrhoids       were medium-sized and Grade I (internal hemorrhoids that do not       prolapse).      The perianal exam findings include non-thrombosed external hemorrhoids.       Small focal area of bleeding noted in the anorectum during withdrawal of       the colonoscope.      Small maroon-colored stool balls was found in the sigmoid colon.      Unable to intubate the ICV due to excessive looping. Impression:               - Preparation of the colon was  fair.                           - Diverticulosis in the entire examined colon.                           - Internal hemorrhoids.                           - Non-thrombosed external hemorrhoids found on                            perianal exam.                           - Small stool balls noted with blood particles                            noted on them in the sigmoid colon.                           - No specimens collected.                           - Suspect recent bleeding was diverticular in                            origin. No active bleeding seen and culprit                            diverticulae not seen (multiple diverticulae noted                            throughout colon). Moderate Sedation:      N/A - MAC procedure Recommendation:           - High fiber diet.                           -  ProctoFoam-HC: Apply externally daily PRN.                           - Continue present medications.                           - Patient has a contact number available for                            emergencies. The signs and symptoms of potential                            delayed complications were discussed with the                            patient. Return to normal activities tomorrow.                            Written discharge instructions were provided to the                            patient.                           - Repeat colonoscopy is not recommended for                            screening purposes. Procedure Code(s):        --- Professional ---                           [phone], Colonoscopy, flexible; diagnostic, including                            collection of specimen(s) by brushing or washing,                            when performed (separate procedure) Diagnosis Code(s):        --- Professional ---                           K62.5, Hemorrhage of anus and rectum                           K92.1, Melena (includes Hematochezia)                           K92.2,  Gastrointestinal hemorrhage, unspecified                           K64.0, First degree hemorrhoids                           K64.4, Residual hemorrhoidal skin tags                           K57.30, Diverticulosis of large intestine without  perforation or abscess without bleeding CPT copyright 2016 American Medical Association. All rights reserved. The codes documented in this report are preliminary and upon coder review may  be revised to meet current compliance requirements. Lear Ng, MD 09/16/2017 1:51:07 PM This report has been signed electronically. Number of Addenda: 0

## 2017-09-16 NOTE — Anesthesia Postprocedure Evaluation (Signed)
Anesthesia Post Note  Patient: Tamula Morrical  Procedure(s) Performed: COLONOSCOPY WITH PROPOFOL (N/A )     Patient location during evaluation: PACU Anesthesia Type: MAC Pain management: pain level controlled Respiratory status: spontaneous breathing Cardiovascular status: stable Anesthetic complications: no    Last Vitals:  Vitals:   09/16/17 1355 09/16/17 1405  BP: (!) 105/33 109/71  Pulse: 78 75  Resp: 19 14  Temp:    SpO2: 100% 100%    Last Pain:  Vitals:   09/16/17 1343  TempSrc: Oral                 Parrish Bonn

## 2017-09-16 NOTE — H&P (View-Only) (Signed)
Referring Provider: Dr. Florene Glen Primary Care Physician:  Aretta Nip, MD Primary Gastroenterologist:  Dr. Paulita Fujita  Reason for Consultation:  GI bleed  HPI: [name] is a 83 y.o. female with a history of presumed diverticular bleeding in 2015 who had a barium enema at that time that showed extensive diverticulosis. She has never had a colonoscopy. Starting last Wednesday she began having bloody diarrhea that would occur 2 times per day described as red and dark red colored stool. Stools have been loose since the onset of the bleeding. Felt dizzy as well for the past week. Had lower abdominal pain as well since onset of the bleeding. Denies melena, nausea or vomiting. Denies any bleeding or stools today. Hgb 6.3 today at Dr. Dahlia Bailiff office (Hgb 11.2 on 08/31/17).  Past Medical History:  Diagnosis Date  . Anxiety   . Breast cancer ([place])    left 1994, right 2007  . Diverticulosis   . Diverticulosis of colon with hemorrhage 06/19/2014  . DVT (deep venous thrombosis) ([place]) 1992   right leg x 2  . Endometrial adenocarcinoma ([place]) 12/25/12   biopsy  . Family history of malignant neoplasm of gastrointestinal tract   . Hx of radiation therapy 02/2006 - 03/2006   right breast  . Hx of radiation therapy 5/12, 5/19, 5/29, 6/2, 04/30/2013   proximal vagina, 30 Gy in 5 sessions, HDR  . Hypertension   . Iron deficiency   . Personal history of radiation therapy   . Ulcers of both lower extremities ([facility])    chronic  . Venous stasis    bilateral    Past Surgical History:  Procedure Laterality Date  . ABDOMINAL HYSTERECTOMY  01/18/13   robotic, bso  . BREAST LUMPECTOMY    . diverticulosis    . MASTECTOMY Left 1994   left , Tamoxifen x 17 yrs  . partial mastectomy Right 2007   radiation    Prior to Admission medications   Medication Sig Start Date End Date Taking? Authorizing Provider  ALPRAZolam Duanne Moron) 0.5 MG tablet Take 0.5 mg by mouth 3 (three) times daily as needed for anxiety.  As needed 08/10/11  Yes [provider]  amiloride-hydrochlorothiazide (MODURETIC) 5-50 MG tablet Take 0.5 tablets by mouth daily.  08/16/11  Yes [provider]  aspirin EC 325 MG tablet Take 325 mg by mouth daily.   Yes [provider]  Cholecalciferol (VITAMIN D3) 1000 UNITS CAPS Take 1,000 Units by mouth daily.    Yes [provider]  H[place] Chestnut 300 MG CAPS Take 1 capsule by mouth daily.   Yes [provider]  KLOR-CON M10 10 MEQ tablet Take 10 mEq by mouth daily.  06/22/11  Yes [provider]  Magnesium 250 MG TABS Take 1 tablet by mouth daily.   Yes [provider]  Multiple Vitamins-Minerals (CENTRUM SILVER PO) Take 1 tablet by mouth daily.     Yes [provider]  Omega-3 Fatty Acids (FISH OIL) 1000 MG CAPS Take 1 capsule by mouth daily.     Yes [provider]  traZODone (DESYREL) 50 MG tablet Take 25 mg by mouth at bedtime. TAKES 1/2 TABLET   Yes [provider]  vitamin C (ASCORBIC ACID) 500 MG tablet Take 500 mg by mouth daily.   Yes [provider]    Scheduled Meds: . potassium chloride  10 mEq Oral Daily  . traZODone  25 mg Oral QHS   Continuous Infusions: PRN Meds:.  Allergies as of  09/05/2017  . (No Known Allergies)    Family History  Problem Relation Age of Onset  . Cancer Father        skin  . Colon cancer Brother   . Cancer Brother        prostate  . Heart disease Brother   . Breast cancer Daughter   . Colon cancer Sister   . Heart disease Sister        MI  . Breast cancer Sister   . Anuerysm Sister     Social History   Social History  . Marital status: Widowed    Spouse name: N/A  . Number of children: 6  . Years of education: N/A   Occupational History  . Retired     Social History Main Topics  . Smoking status: Never Smoker  . Smokeless tobacco: Never Used  . Alcohol use No  . Drug use: No  . Sexual activity: No   Other Topics Concern   . Not on file   Social History Narrative   2 caffeine drinks daily     Review of Systems: All negative except as stated above in HPI.  Physical Exam: Vital signs: Vitals:   09/05/17 1429 09/05/17 1500  BP:  (!) 150/49  Pulse:  73  Resp:  13  Temp: 97.7 F (36.5 C)   SpO2:  100%     General:   Lethargic, elderly, thin, pleasant and cooperative in NAD Head: normocephalic, atraumatic Eyes: anicteric sclera ENT: oropharynx clear Neck: supple, nontender Lungs:  Clear throughout to auscultation.   No wheezes, crackles, or rhonchi. No acute distress. Heart:  Regular rate and rhythm; no murmurs, clicks, rubs,  or gallops. Abdomen: lower abdominal tenderness with minimal guarding, soft, nondistended, +BS  Rectal:  Deferred Ext: no edema  GI:  Lab Results: No results for input(s): WBC, HGB, HCT, PLT in the last 72 hours. BMET No results for input(s): NA, K, CL, CO2, GLUCOSE, BUN, CREATININE, CALCIUM in the last 72 hours. LFT No results for input(s): PROT, ALBUMIN, AST, ALT, ALKPHOS, BILITOT, BILIDIR, IBILI in the last 72 hours. PT/INR No results for input(s): LABPROT, INR in the last 72 hours.   Studies/Results: No results found.  Impression/Plan: 83 yo with hematochezia with loose stools and lower abdominal pain concerning for ischemic colitis more so than diverticular bleeding. Will do an abd/pelvis CT with contrast (if serum Cr ok) to evaluate for colitis and if colonic inflammation present then will need IV Abx. Clear liquid diet. Due to advanced age will hold off on a colonoscopy unless bleeding persists. Supportive care. Will follow.    LOS: 0 days   [place] C.  09/05/2017, 5:05 PM  Pager [phone]  AFTER 5 pm or on weekends please call [phone]

## 2017-09-16 NOTE — Anesthesia Preprocedure Evaluation (Addendum)
Anesthesia Evaluation  Patient identified by MRN, date of birth, ID band Patient awake    Reviewed: Allergy & Precautions, NPO status , Patient's Chart, lab work & pertinent test results  Airway Mallampati: III  TM Distance: >3 FB Neck ROM: Limited    Dental   Pulmonary neg pulmonary ROS,    Pulmonary exam normal        Cardiovascular hypertension, Pt. on medications + Peripheral Vascular Disease   Rhythm:Regular Rate:Normal     Neuro/Psych Anxiety negative neurological ROS     GI/Hepatic Neg liver ROS, GERD  Medicated,  Endo/Other  negative endocrine ROS  Renal/GU negative Renal ROS     Musculoskeletal   Abdominal   Peds  Hematology  (+) anemia ,   Anesthesia Other Findings   Reproductive/Obstetrics                            Anesthesia Physical Anesthesia Plan  ASA: III  Anesthesia Plan: MAC   Post-op Pain Management:    Induction: Intravenous  PONV Risk Score and Plan: 2 and Ondansetron, Propofol infusion and Treatment may vary due to age or medical condition  Airway Management Planned: Natural Airway  Additional Equipment:   Intra-op Plan:   Post-operative Plan:   Informed Consent: I have reviewed the patients History and Physical, chart, labs and discussed the procedure including the risks, benefits and alternatives for the proposed anesthesia with the patient or authorized representative who has indicated his/her understanding and acceptance.     Plan Discussed with:   Anesthesia Plan Comments:         Anesthesia Quick Evaluation

## 2017-09-16 NOTE — Interval H&P Note (Signed)
History and Physical Interval Note:  09/16/2017 1:05 PM  [name]  has presented today for surgery, with the diagnosis of Rectal bleeding  The various methods of treatment have been discussed with the patient and family. After consideration of risks, benefits and other options for treatment, the patient has consented to  Procedure(s) with comments: COLONOSCOPY WITH PROPOFOL (N/A) - Needs STAT CBC drawn before procedure as a surgical intervention .  The patient's history has been reviewed, patient examined, no change in status, stable for surgery.  I have reviewed the patient's chart and labs.  Questions were answered to the patient's satisfaction.     [place] C.

## 2017-09-16 NOTE — Transfer of Care (Signed)
Immediate Anesthesia Transfer of Care Note  Patient: [name]  Procedure(s) Performed: COLONOSCOPY WITH PROPOFOL (N/A )  Patient Location: PACU  Anesthesia Type:MAC  Level of Consciousness: awake and alert   Airway & Oxygen Therapy: Patient Spontanous Breathing  Post-op Assessment: Report given to RN and Post -op Vital signs reviewed and stable  Post vital signs: Reviewed and stable  Last Vitals:  Vitals:   09/16/17 1153  BP: (!) 133/40  Pulse: 89  Resp: 11  Temp: 36.5 C  SpO2: 100%    Last Pain:  Vitals:   09/16/17 1153  TempSrc: Oral         Complications: No apparent anesthesia complications

## 2017-09-17 ENCOUNTER — Encounter (HOSPITAL_COMMUNITY): Payer: Self-pay | Admitting: Gastroenterology

## 2017-09-23 ENCOUNTER — Other Ambulatory Visit (HOSPITAL_COMMUNITY): Payer: Self-pay

## 2017-09-26 ENCOUNTER — Encounter (HOSPITAL_COMMUNITY)
Admission: RE | Admit: 2017-09-26 | Discharge: 2017-09-26 | Disposition: A | Discharge status: Home / Self Care | Payer: Medicare Other | Source: Ambulatory Visit | Attending: Family Medicine | Admitting: Family Medicine

## 2017-09-26 DIAGNOSIS — D649 Anemia, unspecified: Secondary | ICD-10-CM | POA: Diagnosis present

## 2017-09-26 LAB — ABO/RH: ABO/RH(D): A POS

## 2017-09-26 LAB — PREPARE RBC (CROSSMATCH)

## 2017-09-26 MED ORDER — SODIUM CHLORIDE 0.9 % IV SOLN: Freq: Once | INTRAVENOUS | Status: DC

## 2017-09-27 LAB — TYPE AND SCREEN
ABO/RH(D): A POS
Antibody Screen: NEGATIVE
Unit division: 0
Unit division: 0

## 2017-09-27 LAB — BPAM RBC
Blood Product Expiration Date: 201811122359
Blood Product Expiration Date: 201811122359
ISSUE DATE / TIME: 201811051226
ISSUE DATE / TIME: 201811050920
Unit Type and Rh: 600
Unit Type and Rh: 600

## 2017-10-03 NOTE — Addendum Note (Signed)
Addendum  created 10/03/17 1645 by Belinda Block, MD   Intraprocedure Staff edited

## 2017-11-09 ENCOUNTER — Telehealth: Payer: Self-pay | Admitting: Oncology

## 2017-11-09 NOTE — Telephone Encounter (Signed)
Called patient and was unable to leave message due to no answering machine

## 2017-11-18 ENCOUNTER — Encounter: Payer: Medicare Other | Admitting: Oncology

## 2017-11-23 ENCOUNTER — Telehealth: Payer: Self-pay | Admitting: Hematology and Oncology

## 2017-11-23 ENCOUNTER — Telehealth: Payer: Self-pay | Admitting: Hematology

## 2017-11-23 NOTE — Telephone Encounter (Signed)
Spoke with patients daughter [name] regarding appointment D/T/Loc/Ph#

## 2017-12-30 NOTE — Progress Notes (Signed)
HEMATOLOGY ONCOLOGY CONSULT NOTE  Patient Care Team: Rankins, Bill Salinas, MD as PCP - General (Family Medicine)  CHIEF COMPLAINTS/PURPOSE OF CONSULTATION:  Anemia  HISTORY OF PRESENTING ILLNESS:   [name] 84 y.o. female is here because of a referral from Dr. Jordan Hawks Rankins regarding anemia.   She is accompanied today by her daughter. The pt reports that she is doing well overall. She notes taking ferrous sulfate once per day since October 2018. She has been taking an acid supressant. She denies any blood in the stools, but notes very dark stools. She reports taking a multivitamin each day. She denies any abdominal pains. She stopped taking NSAIDs on 12/05/17. She is also taking Prozac.   She notes that she feels weak, and occasionally feels dizzy (she uses a cane). She notes that around the time of her diverticular bleed she felt worse. She takes 1273m fish oil once per day. She reports not having a history of receiving blood transfusions or a long history of anemia.   Of note prior to the patient's visit, pt has had a blood transfusion on 09/26/17. She also had a colonoscopy on 09/16/17 that revealed concerns for a diverticular bleed.   Most recent lab results of CBC on 09/21/17 showed RBC at 2.54, Hgb at 7.2, HCT at 22.3, RDW at 15.6.   On review of systems, pt reports having lost some weight (notes recently changing her diet to less meat), very dark stools, some fatigue, regular bowel movement, and denies abdominal pains, bone pains, blood in the stools, blood in the urine, gum bleeds, nose bleeds, bleeding, pain moving her bowels, and leg swelling.   On PMHx the pt reports ductal carcinoma in situ of right breast with + hormone reception s/p excision, Invasive left breast CA in 1994 s/p left masectomy s/p tamoxifen for 17 years, right leg DVT in 1992 and 2007, anemia, HTN, systolic ejection murmur, venous stasis ulcer, pre-diabetes, gout, cervical DDD, endometrial cancer s/p  hysterectomy, s/p radiation. She reports hematochezia in 2014, barium enema with diverticulosis, rectal bleeding in 2015. On FHx she reports family history of breast cancer. On Surgical Hx she reports left mastectomy in 1994, a right partial mastectomy in 2007 and a hysterectomy.    MEDICAL HISTORY:  Past Medical History:  Diagnosis Date  . Anxiety   . [place]    left [place], right 2007  . Diverticulosis   . Diverticulosis of colon with hemorrhage 06/19/2014  . [place]   right [place] x 2  . [place]ometrial [place]   biopsy  . Family history of malignant neoplasm of gastrointestinal tract   . GERD (gastroesophageal reflux disease)   . History of blood transfusion   . Hx of radiation therapy 02/2006 - 03/2006   right breast  . Hx of radiation therapy 5/12, 5/19, 5/29, 6/2, 04/30/2013   proximal vagina, 30 Gy in 5 sessions, HDR  . Hypertension   . Iron deficiency   . Peripheral vascular disease (HCC)    leg wounds  . Personal history of radiation therapy   . Ulcers of both lower extremities (HCC)    chronic  . Venous stasis    bilateral    SURGICAL HISTORY: Past Surgical History:  Procedure Laterality Date  . ABDOMINAL HYSTERECTOMY  01/18/13   robotic, bso  . BREAST LUMPECTOMY    . COLONOSCOPY WITH PROPOFOL N/A 09/16/2017   Procedure: COLONOSCOPY WITH PROPOFOL;  Surgeon: SWilford Corner MD;  Location:  [facility];  Service: Endoscopy;  Laterality: N/A;  Needs STAT CBC drawn before procedure  . MASTECTOMY Left 1994   left , Tamoxifen x 17 yrs  . partial mastectomy Right 2007   radiation    SOCIAL HISTORY: Social History   Socioeconomic History  . Marital status: Widowed    Spouse name: Not on file  . Number of children: 6  . Years of education: Not on file  . Highest education level: Not on file  Social Needs  . Financial resource strain: Not on file  . Food insecurity - worry: Not on file  . Food insecurity  - inability: Not on file  . Transportation needs - medical: Not on file  . Transportation needs - non-medical: Not on file  Occupational History  . Occupation: Retired   Tobacco Use  . Smoking status: Never Smoker  . Smokeless tobacco: Never Used  Substance and Sexual Activity  . Alcohol use: No  . Drug use: No  . Sexual activity: No  Other Topics Concern  . Not on file  Social History Narrative   2 caffeine drinks daily     FAMILY HISTORY: Family History  Problem Relation Age of Onset  . Cancer Father        skin  . Colon cancer Brother   . Cancer Brother        prostate  . Heart disease Brother   . Breast cancer Daughter   . Colon cancer Sister   . Heart disease Sister        MI  . Breast cancer Sister   . Anuerysm Sister     ALLERGIES:  has No Known Allergies.  MEDICATIONS:  Current Outpatient Medications  Medication Sig Dispense Refill  . amiloride-hydrochlorothiazide (MODURETIC) 5-50 MG tablet Take 0.5 tablets by mouth daily.     . Cholecalciferol (VITAMIN D3) 1000 UNITS CAPS Take 1,000 Units by mouth daily.     . Horse Chestnut 300 MG CAPS Take 1 capsule by mouth daily.    [name] KLOR-CON M10 10 MEQ tablet Take 10 mEq by mouth daily.     . Magnesium 250 MG TABS Take 1 tablet by mouth daily.    . Multiple Vitamins-Minerals (CENTRUM SILVER PO) Take 1 tablet by mouth daily.      . Omega-3 Fatty Acids (FISH OIL) 1000 MG CAPS Take 1 capsule by mouth daily.      . pantoprazole (PROTONIX) 40 MG tablet Take 1 tablet (40 mg total) by mouth daily. 30 tablet 0  . traZODone (DESYREL) 50 MG tablet Take 25 mg by mouth at bedtime. TAKES 1/2 TABLET    . vitamin C (ASCORBIC ACID) 500 MG tablet Take 500 mg by mouth daily.     No current facility-administered medications for this visit.     REVIEW OF SYSTEMS:   .10 Point review of Systems was done is negative except as noted above.   PHYSICAL EXAMINATION:  Vitals:   01/02/18 1129  BP: (!) 134/57  Pulse: 71  Resp: 20    Temp: 97.8 F (36.6 C)  SpO2: 98%   Filed Weights   01/02/18 1129  Weight: 181 lb 9.6 oz (82.4 kg)    GENERAL:alert, no distress and comfortable SKIN: skin color, texture, turgor are normal, no rashes or significant lesions EYES: normal, conjunctiva are pink and non-injected, sclera clear OROPHARYNX:no exudate, no erythema and lips, buccal mucosa, and tongue normal  NECK: supple, thyroid normal size, non-tender, without nodularity LYMPH:  no palpable  lymphadenopathy in the cervical, axillary or inguinal LUNGS: clear to auscultation and percussion with normal breathing effort HEART: regular rate & rhythm and no murmurs and no lower extremity edema ABDOMEN:abdomen soft, non-tender and normal bowel sounds Musculoskeletal:no cyanosis of digits and no clubbing  PSYCH: alert & oriented x 3 with fluent speech NEURO: no focal motor/sensory deficits  LABORATORY DATA:  I have reviewed the data as listed  . CBC Latest Ref Rng & Units 01/02/2018 01/02/2018 09/07/2017  WBC 3.9 - 10.3 K/uL 7.4 - 8.0  Hemoglobin 12.0 - 15.0 g/dL - - 8.2(L)  Hematocrit 34.0 - 46.6 % 35.7 34.7(L) 24.4(L)  Platelets 145 - 400 K/uL 239 - 257   hgb 11.5  . CMP Latest Ref Rng & Units 01/02/2018 09/06/2017 09/05/2017  Glucose 70 - 140 mg/dL 85 93 98  BUN 7 - 26 mg/dL _0 Creatinine 0.60 - 1.10 mg/dL 1.07 0.96 1.02(H)  Sodium 136 - 145 mmol/L 141 139 140  Potassium 3.5 - 5.1 mmol/L 4.5 3.9 3.7  Chloride 98 - 109 mmol/L 103 106 106  CO2 22 - 29 mmol/L _1 Calcium 8.4 - 10.4 mg/dL 10.3 8.7(L) 9.2  Total Protein 6.4 - 8.3 g/dL 7.1 - 6.1(L)  Total Bilirubin 0.2 - 1.2 mg/dL 0.5 - 0.3  Alkaline Phos 40 - 150 U/L 68 - 52  AST 5 - 34 U/L 14 - 16  ALT 0 - 55 U/L 9 - 10(L)   . Lab Results  Component Value Date   IRON 55 01/02/2018   TIBC 305 01/02/2018   IRONPCTSAT 18 (L) 01/02/2018   (Iron and TIBC)  Lab Results  Component Value Date   FERRITIN 70 01/02/2018    Component     Latest Ref Rng  & Units 01/02/2018  IgG (Immunoglobin G), Serum     700 - 1,600 mg/dL 881  IgA     64 - 422 mg/dL 327  IgM (Immunoglobulin M), Srm     26 - 217 mg/dL 72  Total Protein ELP     6.0 - 8.5 g/dL 6.8  Albumin SerPl Elph-Mcnc     2.9 - 4.4 g/dL 3.7  Alpha 1     0.0 - 0.4 g/dL 0.2  Alpha2 Glob SerPl Elph-Mcnc     0.4 - 1.0 g/dL 0.9  B-Globulin SerPl Elph-Mcnc     0.7 - 1.3 g/dL 1.2  Gamma Glob SerPl Elph-Mcnc     0.4 - 1.8 g/dL 0.7  M Protein SerPl Elph-Mcnc     Not Observed g/dL 0.2 (H)  Globulin, Total     2.2 - 3.9 g/dL 3.1  Albumin/Glob SerPl     0.7 - 1.7 1.2  IFE 1      Comment  Please Note (HCV):      Comment  Folate, Hemolysate     Not Estab. ng/mL 564.4  HCT     34.0 - 46.6 % 35.7  Folate, RBC     >498 ng/mL 1,581  Retic Ct Pct     0.7 - 2.1 % 1.0  RBC.     3.70 - 5.45 MIL/uL 4.14  Retic Count, Absolute     33.7 - 90.7 K/uL 41.4  Vitamin B12     180 - 914 pg/mL 1,566 (H)   Multiple Myeloma Panel (SPEP&IFE w/QIG)      The value has a corrected status.      No reference range information available      Resulting Lab:   CLINICAL LABORATORY      Comments:           (NOTE)           Immunofixation shows IgG monoclonal protein with kappa light           chain  CBC from 09/21/17    RADIOGRAPHIC STUDIES: I have personally reviewed the radiological images as listed and agreed with the findings in the report. No results found.  ASSESSMENT & PLAN:  Brenda Samano is a 84 y.o. female with  1. Anemia - primarily from Iron deficiency hgb has significantly improved to 11.5 from a low of7.2 (due to diverticular bleeding). Iron deficiency correcting with po iron replacement. PLan Discussed with patient she labs results -appears to have had near complete correction of her anemia with resolving iron deficiency.wit po iron replacement. -continue po ferrous sulfate 1 tba po BiD or preferrable Iron polysaccharide 174m po BID. -will hold off on IV Iron  at this time -monitor and f/u with PCP/GI to monitor for diverticular bleeding. - no indication of Myeloma. B12 levels and RBC folate - WNL  2. IgG Kappa M spike of 0.2g/dl- likely MGUS. Pln -rpt myeloma panel in 3 -4 months  Labs today RTC with Dr KIrene Limboin 276monthwith labs  All questions were answered. The patient knows to call the clinic with any problems, questions or concerns. I spent 35 minutes counseling the patient face to face. The total time spent in the appointment was 45 minutes and more than 50% was on counseling.   This document serves as a record of services personally performed by GaSullivan LoneMD. It was created on his behalf by ScBaldwin Jamaicaa trained medical scribe. The creation of this record is based on the scribe's personal observations and the provider's statements to them.   .I have reviewed the above documentation for accuracy and completeness, and I agree with the above. .GBrunetta GeneraD MS

## 2018-01-02 ENCOUNTER — Inpatient Hospital Stay: Payer: Medicare Other

## 2018-01-02 ENCOUNTER — Inpatient Hospital Stay: Payer: Medicare Other | Attending: Hematology | Admitting: Hematology

## 2018-01-02 ENCOUNTER — Telehealth: Payer: Self-pay | Admitting: Hematology

## 2018-01-02 VITALS — BP 134/57 | HR 71 | Temp 97.8°F | Resp 20 | Ht 66.0 in | Wt 181.6 lb

## 2018-01-02 DIAGNOSIS — Z9011 Acquired absence of right breast and nipple: Secondary | ICD-10-CM

## 2018-01-02 DIAGNOSIS — Z803 Family history of malignant neoplasm of breast: Secondary | ICD-10-CM

## 2018-01-02 DIAGNOSIS — Z86000 Personal history of in-situ neoplasm of breast: Secondary | ICD-10-CM | POA: Diagnosis not present

## 2018-01-02 DIAGNOSIS — I10 Essential (primary) hypertension: Secondary | ICD-10-CM

## 2018-01-02 DIAGNOSIS — D649 Anemia, unspecified: Secondary | ICD-10-CM

## 2018-01-02 DIAGNOSIS — D500 Iron deficiency anemia secondary to blood loss (chronic): Secondary | ICD-10-CM | POA: Insufficient documentation

## 2018-01-02 DIAGNOSIS — D509 Iron deficiency anemia, unspecified: Secondary | ICD-10-CM

## 2018-01-02 DIAGNOSIS — K922 Gastrointestinal hemorrhage, unspecified: Secondary | ICD-10-CM | POA: Diagnosis not present

## 2018-01-02 DIAGNOSIS — Z9012 Acquired absence of left breast and nipple: Secondary | ICD-10-CM

## 2018-01-02 DIAGNOSIS — D472 Monoclonal gammopathy: Secondary | ICD-10-CM | POA: Diagnosis not present

## 2018-01-02 DIAGNOSIS — Z8542 Personal history of malignant neoplasm of other parts of uterus: Secondary | ICD-10-CM

## 2018-01-02 DIAGNOSIS — Z86711 Personal history of pulmonary embolism: Secondary | ICD-10-CM

## 2018-01-02 LAB — CMP (CANCER CENTER ONLY)
ALBUMIN: 3.6 g/dL (ref 3.5–5.0)
ALK PHOS: 68 U/L (ref 40–150)
ALT: 9 U/L (ref 0–55)
AST: 14 U/L (ref 5–34)
Anion gap: 9 (ref 3–11)
BUN: 25 mg/dL (ref 7–26)
CALCIUM: 10.3 mg/dL (ref 8.4–10.4)
CO2: 29 mmol/L (ref 22–29)
CREATININE: 1.07 mg/dL (ref 0.60–1.10)
Chloride: 103 mmol/L (ref 98–109)
GFR, EST AFRICAN AMERICAN: 51 mL/min — AB (ref >60)
GFR, Estimated: 44 mL/min — ABNORMAL LOW (ref >60)
GLUCOSE: 85 mg/dL (ref 70–140)
Potassium: 4.5 mmol/L (ref 3.5–5.1)
Sodium: 141 mmol/L (ref 136–145)
Total Bilirubin: 0.5 mg/dL (ref 0.2–1.2)
Total Protein: 7.1 g/dL (ref 6.4–8.3)

## 2018-01-02 LAB — IRON AND TIBC
Iron: 55 ug/dL (ref 41–142)
Saturation Ratios: 18 % — ABNORMAL LOW (ref 21–57)
TIBC: 305 ug/dL (ref 236–444)
UIBC: 250 ug/dL

## 2018-01-02 LAB — CBC WITH DIFFERENTIAL (CANCER CENTER ONLY)
BASOS PCT: 0 %
Basophils Absolute: 0 10*3/uL (ref 0.0–0.1)
EOS ABS: 0 10*3/uL (ref 0.0–0.5)
EOS PCT: 0 %
HCT: 34.7 % — ABNORMAL LOW (ref 34.8–46.6)
Hemoglobin: 11.5 g/dL — ABNORMAL LOW (ref 11.6–15.9)
Lymphocytes Relative: 17 %
Lymphs Abs: 1.3 10*3/uL (ref 0.9–3.3)
MCH: 27.8 pg (ref 25.1–34.0)
MCHC: 33.1 g/dL (ref 31.5–36.0)
MCV: 83.8 fL (ref 79.5–101.0)
MONO ABS: 0.4 10*3/uL (ref 0.1–0.9)
MONOS PCT: 5 %
Neutro Abs: 5.7 10*3/uL (ref 1.5–6.5)
Neutrophils Relative %: 78 %
Platelet Count: 239 10*3/uL (ref 145–400)
RBC: 4.14 MIL/uL (ref 3.70–5.45)
RDW: 16.9 % — AB (ref 11.2–14.5)
WBC Count: 7.4 10*3/uL (ref 3.9–10.3)

## 2018-01-02 LAB — RETICULOCYTES
RBC.: 4.14 MIL/uL (ref 3.70–5.45)
RETIC CT PCT: 1 % (ref 0.7–2.1)
Retic Count, Absolute: 41.4 10*3/uL (ref 33.7–90.7)

## 2018-01-02 LAB — FERRITIN: Ferritin: 70 ng/mL (ref 9–269)

## 2018-01-02 LAB — VITAMIN B12: Vitamin B-12: 1566 pg/mL — ABNORMAL HIGH (ref 180–914)

## 2018-01-02 NOTE — Telephone Encounter (Signed)
Gave patient avs and calendar with appts per 2/11 los.  °

## 2018-01-03 LAB — FOLATE RBC
FOLATE, RBC: 1581 ng/mL (ref >498)
Folate, Hemolysate: 564.4 ng/mL
Hematocrit: 35.7 % (ref 34.0–46.6)

## 2018-01-05 LAB — MULTIPLE MYELOMA PANEL, SERUM
ALBUMIN/GLOB SERPL: 1.2 (ref 0.7–1.7)
ALPHA 1: 0.2 g/dL (ref 0.0–0.4)
Albumin SerPl Elph-Mcnc: 3.7 g/dL (ref 2.9–4.4)
Alpha2 Glob SerPl Elph-Mcnc: 0.9 g/dL (ref 0.4–1.0)
B-Globulin SerPl Elph-Mcnc: 1.2 g/dL (ref 0.7–1.3)
GAMMA GLOB SERPL ELPH-MCNC: 0.7 g/dL (ref 0.4–1.8)
GLOBULIN, TOTAL: 3.1 g/dL (ref 2.2–3.9)
IgA: 327 mg/dL (ref 64–422)
IgG (Immunoglobin G), Serum: 881 mg/dL (ref 700–1600)
IgM (Immunoglobulin M), Srm: 72 mg/dL (ref 26–217)
M Protein SerPl Elph-Mcnc: 0.2 g/dL — ABNORMAL HIGH
Total Protein ELP: 6.8 g/dL (ref 6.0–8.5)

## 2018-03-19 NOTE — Progress Notes (Signed)
HEMATOLOGY ONCOLOGY CLINIC NOTE  Date of Service: 03/20/18  Patient Care Team: Rankins, Bill Salinas, MD as PCP - General (Family Medicine)  CHIEF COMPLAINTS/PURPOSE OF CONSULTATION:  F/u for anemia  HISTORY OF PRESENTING ILLNESS:   [name] 84 y.o. female is here because of a referral from Dr. Jordan Hawks Rankins regarding anemia.   She is accompanied today by her daughter. The pt reports that she is doing well overall. She notes taking ferrous sulfate once per day since October 2018. She has been taking an acid supressant. She denies any blood in the stools, but notes very dark stools. She reports taking a multivitamin each day. She denies any abdominal pains. She stopped taking NSAIDs on 12/05/17. She is also taking Prozac.   She notes that she feels weak, and occasionally feels dizzy (she uses a cane). She notes that around the time of her diverticular bleed she felt worse. She takes 129m fish oil once per day. She reports not having a history of receiving blood transfusions or a long history of anemia.   Of note prior to the patient's visit, pt has had a blood transfusion on 09/26/17. She also had a colonoscopy on 09/16/17 that revealed concerns for a diverticular bleed.   Most recent lab results of CBC on 09/21/17 showed RBC at 2.54, Hgb at 7.2, HCT at 22.3, RDW at 15.6.   On review of systems, pt reports having lost some weight (notes recently changing her diet to less meat), very dark stools, some fatigue, regular bowel movement, and denies abdominal pains, bone pains, blood in the stools, blood in the urine, gum bleeds, nose bleeds, bleeding, pain moving her bowels, and leg swelling.   On PMHx the pt reports ductal carcinoma in situ of right breast with + hormone reception s/p excision, Invasive left breast CA in 1994 s/p left masectomy s/p tamoxifen for 17 years, right leg DVT in 1992 and 2007, anemia, HTN, systolic ejection murmur, venous stasis ulcer, pre-diabetes, gout,  cervical DDD, endometrial cancer s/p hysterectomy, s/p radiation. She reports hematochezia in 2014, barium enema with diverticulosis, rectal bleeding in 2015. On FHx she reports family history of breast cancer. On Surgical Hx she reports left mastectomy in 1994, a right partial mastectomy in 2007 and a hysterectomy.   Interval History:   Ms [name] Keesreturns to clinic today regarding her anemia. The patient's last visit with [facility] on 01/02/18. She is accompanied today by her daughter. The pt reports that she is doing well overall. The pt will see her PCP next in July.   The pt reports that she has been taking Ferrous sulfate once each day. She has no new concerns since her last visit. She denies any concerns for bleeding or seeing blood on the toilet paper. She is not currently taking NSAIDs or other blood thinners.   She notes that she was stopped on her diuretic BP mx 3 weeks ago because she was orthostatic, and her dizziness has improved.   Lab results today (03/20/18) of CBC and Reticulocytes is as follows: all values are WNL except for RBC at 3.64, Hgb at 10.3, Hct at 32.0, RDW at 162. Ferritin ia improved to 84 and Iron/TIBC 03/20/18 is at 28% MMP on 02/2918 showed all values WNL except for M protein at 0.2 - IgG Kappa.  On review of systems, pt reports some leg swelling, eating well, regular bowel movements, resolving dizziness, and denies blood in the stools, black stools, nose bleeds, gum bleeds, constipation, abdominal  pains, and any other symptoms.    MEDICAL HISTORY:  Past Medical History:  Diagnosis Date  . Anxiety   . Breast cancer ([place])    left 1994, right 2007  . Diverticulosis   . Diverticulosis of colon with hemorrhage 06/19/2014  . DVT (deep venous thrombosis) ([place]) 1992   right leg x 2  . Endometrial adenocarcinoma ([place]) 12/25/12   biopsy  . Family history of malignant neoplasm of gastrointestinal tract   . GERD (gastroesophageal reflux disease)   . History of  blood transfusion   . Hx of radiation therapy 02/2006 - 03/2006   right breast  . Hx of radiation therapy 5/12, 5/19, 5/29, 6/2, 04/30/2013   proximal vagina, 30 Gy in 5 sessions, HDR  . Hypertension   . Iron deficiency   . Peripheral vascular disease (HCC)    leg wounds  . Personal history of radiation therapy   . Ulcers of both lower extremities (HCC)    chronic  . Venous stasis    bilateral    SURGICAL HISTORY: Past Surgical History:  Procedure Laterality Date  . ABDOMINAL HYSTERECTOMY  01/18/13   robotic, bso  . BREAST LUMPECTOMY    . COLONOSCOPY WITH PROPOFOL N/A 09/16/2017   Procedure: COLONOSCOPY WITH PROPOFOL;  Surgeon: Wilford Corner, MD;  Location: [facility];  Service: Endoscopy;  Laterality: N/A;  Needs STAT CBC drawn before procedure  . MASTECTOMY Left 1994   left , Tamoxifen x 17 yrs  . partial mastectomy Right 2007   radiation    SOCIAL HISTORY: Social History   Socioeconomic History  . Marital status: Widowed    Spouse name: Not on file  . Number of children: 6  . Years of education: Not on file  . Highest education level: Not on file  Occupational History  . Occupation: Retired   Scientific laboratory technician  . Financial resource strain: Not on file  . Food insecurity:    Worry: Not on file    Inability: Not on file  . Transportation needs:    Medical: Not on file    Non-medical: Not on file  Tobacco Use  . Smoking status: Never Smoker  . Smokeless tobacco: Never Used  Substance and Sexual Activity  . Alcohol use: No  . Drug use: No  . Sexual activity: Never  Lifestyle  . Physical activity:    Days per week: Not on file    Minutes per session: Not on file  . Stress: Not on file  Relationships  . Social connections:    Talks on phone: Not on file    Gets together: Not on file    Attends religious service: Not on file    Active member of club or organization: Not on file    Attends meetings of clubs or organizations: Not on file    Relationship  status: Not on file  . Intimate partner violence:    Fear of current or ex partner: Not on file    Emotionally abused: Not on file    Physically abused: Not on file    Forced sexual activity: Not on file  Other Topics Concern  . Not on file  Social History Narrative   2 caffeine drinks daily     FAMILY HISTORY: Family History  Problem Relation Age of Onset  . Cancer Father        skin  . Colon cancer Brother   . Cancer Brother        prostate  . Heart  disease Brother   . Breast cancer Daughter   . Colon cancer Sister   . Heart disease Sister        MI  . Breast cancer Sister   . Anuerysm Sister     ALLERGIES:  has No Known Allergies.  MEDICATIONS:  Current Outpatient Medications  Medication Sig Dispense Refill  . amiloride-hydrochlorothiazide (MODURETIC) 5-50 MG tablet Take 0.5 tablets by mouth daily.     . Cholecalciferol (VITAMIN D3) 1000 UNITS CAPS Take 1,000 Units by mouth daily.     [name] KLOR-CON M10 10 MEQ tablet Take 10 mEq by mouth daily.     . Magnesium 250 MG TABS Take 1 tablet by mouth daily.    . Multiple Vitamins-Minerals (CENTRUM SILVER PO) Take 1 tablet by mouth daily.      . Omega-3 Fatty Acids (FISH OIL) 1000 MG CAPS Take 1 capsule by mouth daily.      . pantoprazole (PROTONIX) 40 MG tablet Take 1 tablet (40 mg total) by mouth daily. 30 tablet 0  . traZODone (DESYREL) 50 MG tablet Take 25 mg by mouth at bedtime. TAKES 1/2 TABLET    . vitamin C (ASCORBIC ACID) 500 MG tablet Take 500 mg by mouth daily.     No current facility-administered medications for this visit.     REVIEW OF SYSTEMS:    10 Point review of Systems was done is negative except as noted above.   PHYSICAL EXAMINATION:  Vitals:   03/20/18 1230  BP: (!) 149/56  Pulse: 65  Resp: 18  Temp: 97.7 F (36.5 C)  SpO2: 100%   Filed Weights   03/20/18 1230  Weight: 186 lb 11.2 oz (84.7 kg)  . GENERAL:alert, in no acute distress and comfortable SKIN: no acute rashes, no significant  lesions EYES: conjunctiva are pink and non-injected, sclera anicteric OROPHARYNX: MMM, no exudates, no oropharyngeal erythema or ulceration NECK: supple, no JVD LYMPH:  no palpable lymphadenopathy in the cervical, axillary or inguinal regions LUNGS: clear to auscultation b/l with normal respiratory effort HEART: regular rate & rhythm ABDOMEN:  normoactive bowel sounds , non tender, not distended. Extremity: no pedal edema PSYCH: alert & oriented x 3 with fluent speech NEURO: no focal motor/sensory deficits   LABORATORY DATA:  I have reviewed the data as listed  . CBC Latest Ref Rng & Units 03/20/2018 01/02/2018 01/02/2018  WBC 3.9 - 10.3 K/uL 6.0 7.4 -  Hemoglobin 11.6 - 15.9 g/dL 10.3(L) 11.5(L) -  Hematocrit 34.8 - 46.6 % 32.0(L) 35.7 34.7(L)  Platelets 145 - 400 K/uL 210 239 -   . CMP Latest Ref Rng & Units 01/02/2018 09/06/2017 09/05/2017  Glucose 70 - 140 mg/dL 85 93 98  BUN 7 - 26 mg/dL _0 Creatinine 0.60 - 1.10 mg/dL 1.07 0.96 1.02(H)  Sodium 136 - 145 mmol/L 141 139 140  Potassium 3.5 - 5.1 mmol/L 4.5 3.9 3.7  Chloride 98 - 109 mmol/L 103 106 106  CO2 22 - 29 mmol/L _1 Calcium 8.4 - 10.4 mg/dL 10.3 8.7(L) 9.2  Total Protein 6.4 - 8.3 g/dL 7.1 - 6.1(L)  Total Bilirubin 0.2 - 1.2 mg/dL 0.5 - 0.3  Alkaline Phos 40 - 150 U/L 68 - 52  AST 5 - 34 U/L 14 - 16  ALT 0 - 55 U/L 9 - 10(L)   . Lab Results  Component Value Date   IRON 76 03/20/2018   TIBC 271 03/20/2018   IRONPCTSAT 28 03/20/2018   (  Iron and TIBC)  Lab Results  Component Value Date   FERRITIN 84 03/20/2018    Component     Latest Ref Rng & Units 01/02/2018  IgG (Immunoglobin G), Serum     700 - 1,600 mg/dL 881  IgA     64 - 422 mg/dL 327  IgM (Immunoglobulin M), Srm     26 - 217 mg/dL 72  Total Protein ELP     6.0 - 8.5 g/dL 6.8  Albumin SerPl Elph-Mcnc     2.9 - 4.4 g/dL 3.7  Alpha 1     0.0 - 0.4 g/dL 0.2  Alpha2 Glob SerPl Elph-Mcnc     0.4 - 1.0 g/dL 0.9  B-Globulin SerPl  Elph-Mcnc     0.7 - 1.3 g/dL 1.2  Gamma Glob SerPl Elph-Mcnc     0.4 - 1.8 g/dL 0.7  M Protein SerPl Elph-Mcnc     Not Observed g/dL 0.2 (H)  Globulin, Total     2.2 - 3.9 g/dL 3.1  Albumin/Glob SerPl     0.7 - 1.7 1.2  IFE 1      Comment  Please Note (HCV):      Comment  Folate, Hemolysate     Not Estab. ng/mL 564.4  HCT     34.0 - 46.6 % 35.7  Folate, RBC     >498 ng/mL 1,581  Retic Ct Pct     0.7 - 2.1 % 1.0  RBC.     3.70 - 5.45 MIL/uL 4.14  Retic Count, Absolute     33.7 - 90.7 K/uL 41.4  Vitamin B12     180 - 914 pg/mL 1,566 (H)   Multiple Myeloma Panel (SPEP&IFE w/QIG)      The value has a corrected status.      No reference range information available      Resulting Lab: [facility]      Comments:           (NOTE)           Immunofixation shows IgG monoclonal protein with kappa light           chain    RADIOGRAPHIC STUDIES: I have personally reviewed the radiological images as listed and agreed with the findings in the report. No results found.  ASSESSMENT & PLAN:   Avynn Klassen is a 84 y.o. female with  1. Anemia - primarily from Iron deficiency hgb has significantly improved to 11.5 from a low of 7.2 (due to diverticular bleeding). Iron deficiency correcting with po iron replacement. ferriti is upto 79 with an iron saturation of 28%. PLan -continue po ferrous sulfate 1 tba po BiD or preferrable Iron polysaccharide 134m po BID. -will hold off on IV Iron at this time -monitor and f/u with PCP/GI to monitor for diverticular bleeding. - no indication of Myeloma. B12 levels and RBC folate - WNL -Discussed pt labwork today 03/20/18; Hgb decreased slightly to 10.3. Ferritin is conitnuing to improve upt o 84 -Likely diverticulosis is at play that is causing undetected blood losses.  -Would like to see pt back in 3-4 months unless any new concerns develop.   2. IgG Kappa M spike of 0.2g/dl- likely MGUS. Pln -rpt myeloma panel in  3 -4 months  RTC with Dr kIrene Limboin 3 months with labs   All questions were answered. The patient knows to call the clinic with any problems, questions or concerns.  . The total time spent in the appointment was  15 minutes and more than 50% was on counseling and direct patient cares.    Sullivan Lone MD Jones AAHIVMS [facility] Hematology/Oncology Physician [facility]  (Office):       [phone] (Work cell):  [phone] (Fax):           [phone]  This document serves as a record of services personally performed by Sullivan Lone, MD. It was created on his behalf by Baldwin Jamaica, a trained medical scribe. The creation of this record is based on the scribe's personal observations and the provider's statements to them.   .I have reviewed the above documentation for accuracy and completeness, and I agree with the above. Brunetta Genera MD MS

## 2018-03-20 ENCOUNTER — Inpatient Hospital Stay: Payer: Medicare Other | Attending: Hematology

## 2018-03-20 ENCOUNTER — Telehealth: Payer: Self-pay

## 2018-03-20 ENCOUNTER — Encounter: Payer: Self-pay | Admitting: Hematology

## 2018-03-20 ENCOUNTER — Inpatient Hospital Stay (HOSPITAL_BASED_OUTPATIENT_CLINIC_OR_DEPARTMENT_OTHER): Payer: Medicare Other | Admitting: Hematology

## 2018-03-20 VITALS — BP 149/56 | HR 65 | Temp 97.7°F | Resp 18 | Ht 66.0 in | Wt 186.7 lb

## 2018-03-20 DIAGNOSIS — D500 Iron deficiency anemia secondary to blood loss (chronic): Secondary | ICD-10-CM

## 2018-03-20 DIAGNOSIS — D649 Anemia, unspecified: Secondary | ICD-10-CM

## 2018-03-20 DIAGNOSIS — D472 Monoclonal gammopathy: Secondary | ICD-10-CM | POA: Diagnosis not present

## 2018-03-20 DIAGNOSIS — K5791 Diverticulosis of intestine, part unspecified, without perforation or abscess with bleeding: Secondary | ICD-10-CM

## 2018-03-20 DIAGNOSIS — D509 Iron deficiency anemia, unspecified: Secondary | ICD-10-CM

## 2018-03-20 LAB — CBC WITH DIFFERENTIAL (CANCER CENTER ONLY)
BASOS ABS: 0 10*3/uL (ref 0.0–0.1)
Basophils Relative: 0 %
EOS PCT: 1 %
Eosinophils Absolute: 0 10*3/uL (ref 0.0–0.5)
HCT: 32 % — ABNORMAL LOW (ref 34.8–46.6)
Hemoglobin: 10.3 g/dL — ABNORMAL LOW (ref 11.6–15.9)
LYMPHS PCT: 18 %
Lymphs Abs: 1.1 10*3/uL (ref 0.9–3.3)
MCH: 28.3 pg (ref 25.1–34.0)
MCHC: 32.2 g/dL (ref 31.5–36.0)
MCV: 87.9 fL (ref 79.5–101.0)
MONO ABS: 0.5 10*3/uL (ref 0.1–0.9)
Monocytes Relative: 8 %
Neutro Abs: 4.4 10*3/uL (ref 1.5–6.5)
Neutrophils Relative %: 73 %
PLATELETS: 210 10*3/uL (ref 145–400)
RBC: 3.64 MIL/uL — ABNORMAL LOW (ref 3.70–5.45)
RDW: 16.2 % — AB (ref 11.2–14.5)
WBC Count: 6 10*3/uL (ref 3.9–10.3)

## 2018-03-20 LAB — RETICULOCYTES
RBC.: 3.64 MIL/uL — ABNORMAL LOW (ref 3.70–5.45)
RETIC COUNT ABSOLUTE: 51 10*3/uL (ref 33.7–90.7)
Retic Ct Pct: 1.4 % (ref 0.7–2.1)

## 2018-03-20 LAB — IRON AND TIBC
IRON: 76 ug/dL (ref 41–142)
Saturation Ratios: 28 % (ref 21–57)
TIBC: 271 ug/dL (ref 236–444)
UIBC: 195 ug/dL

## 2018-03-20 LAB — FERRITIN: FERRITIN: 84 ng/mL (ref 9–269)

## 2018-03-20 NOTE — Telephone Encounter (Signed)
Printed avs and calender of upcoming appointment. Per 4/29 los  

## 2018-04-06 ENCOUNTER — Other Ambulatory Visit: Payer: Self-pay | Admitting: Family Medicine

## 2018-04-06 DIAGNOSIS — Z1231 Encounter for screening mammogram for malignant neoplasm of breast: Secondary | ICD-10-CM

## 2018-05-11 ENCOUNTER — Ambulatory Visit: Payer: Medicare Other

## 2018-05-31 ENCOUNTER — Ambulatory Visit
Admission: RE | Admit: 2018-05-31 | Discharge: 2018-05-31 | Disposition: A | Discharge status: Home / Self Care | Payer: Medicare Other | Source: Ambulatory Visit | Attending: Family Medicine | Admitting: Family Medicine

## 2018-05-31 DIAGNOSIS — Z1231 Encounter for screening mammogram for malignant neoplasm of breast: Secondary | ICD-10-CM

## 2018-06-16 NOTE — Progress Notes (Signed)
HEMATOLOGY ONCOLOGY CLINIC NOTE  Date of Service: 06/19/18    Patient Care Team: Rankins, Bill Salinas, MD as PCP - General (Family Medicine)  CHIEF COMPLAINTS/PURPOSE OF CONSULTATION:  F/u for anemia  HISTORY OF PRESENTING ILLNESS:   [name] 84 y.o. female is here because of a referral from Dr. Jordan Hawks Rankins regarding anemia.   She is accompanied today by her daughter. The pt reports that she is doing well overall. She notes taking ferrous sulfate once per day since October 2018. She has been taking an acid supressant. She denies any blood in the stools, but notes very dark stools. She reports taking a multivitamin each day. She denies any abdominal pains. She stopped taking NSAIDs on 12/05/17. She is also taking Prozac.   She notes that she feels weak, and occasionally feels dizzy (she uses a cane). She notes that around the time of her diverticular bleed she felt worse. She takes 1263m fish oil once per day. She reports not having a history of receiving blood transfusions or a long history of anemia.   Of note prior to the patient's visit, pt has had a blood transfusion on 09/26/17. She also had a colonoscopy on 09/16/17 that revealed concerns for a diverticular bleed.   Most recent lab results of CBC on 09/21/17 showed RBC at 2.54, Hgb at 7.2, HCT at 22.3, RDW at 15.6.   On review of systems, pt reports having lost some weight (notes recently changing her diet to less meat), very dark stools, some fatigue, regular bowel movement, and denies abdominal pains, bone pains, blood in the stools, blood in the urine, gum bleeds, nose bleeds, bleeding, pain moving her bowels, and leg swelling.   On PMHx the pt reports ductal carcinoma in situ of right breast with + hormone reception s/p excision, Invasive left breast CA in 1994 s/p left masectomy s/p tamoxifen for 17 years, right leg DVT in 1992 and 2007, anemia, HTN, systolic ejection murmur, venous stasis ulcer, pre-diabetes, gout,  cervical DDD, endometrial cancer s/p hysterectomy, s/p radiation. She reports hematochezia in 2014, barium enema with diverticulosis, rectal bleeding in 2015. On FHx she reports family history of breast cancer. On Surgical Hx she reports left mastectomy in 1994, a right partial mastectomy in 2007 and a hysterectomy.   Interval History:   Ms EShekia Kuperreturns to clinic today regarding her anemia. The patient's last visit with [facility] on 03/20/18. She is accompanied today by her daughter. The pt reports that she is doing well overall.   The pt reports that she continues to see her PCP once a year for a physical and as needed between annual visits. She denies any changes in her energy levels or the way she has felt in the last few weeks as compared to previous months. She denies any concerns for bleeding and has maintained compliance with her PO Iron replacement once each day.   Lab results today (06/19/18) of CBC w/diff, CMP, and Reticulocytes is as follows: all values are WNL except for HGB at 10.8, HCT at 32.4, RDW at 15.3, AST at 13. Iron/TIBC 06/19/18 shows all values WNL LDH 06/19/18 is at 158 Ferritin 06/19/18 is.. Lab Results  Component Value Date   IRON 86 06/19/2018   TIBC 289 06/19/2018   IRONPCTSAT 30 06/19/2018   (Iron and TIBC)  Lab Results  Component Value Date   FERRITIN 94 06/19/2018   MMP 06/19/18 is pending Kappa/Lambda 06/19/18 is pending  On review of systems, pt reports  some weight gain, stable energy levels, and denies stomach pains, problems passing urine, blood in the stools, black stools, mouth sores, concerns for bleeding, light headedness, dizziness, and any other symptoms.    MEDICAL HISTORY:  Past Medical History:  Diagnosis Date  . Anxiety   . Breast cancer ([facility])    left 1994, right 2007  . Diverticulosis   . Diverticulosis of colon with hemorrhage 06/19/2014  . DVT (deep venous thrombosis) ([facility]) 1992   right leg x 2  . Endometrial adenocarcinoma  ([facility]) 12/25/12   biopsy  . Family history of malignant neoplasm of gastrointestinal tract   . GERD (gastroesophageal reflux disease)   . History of blood transfusion   . Hx of radiation therapy 02/2006 - 03/2006   right breast  . Hx of radiation therapy 5/12, 5/19, 5/29, 6/2, 04/30/2013   proximal vagina, 30 Gy in 5 sessions, HDR  . Hypertension   . Iron deficiency   . Peripheral vascular disease ([facility])    leg wounds  . Personal history of radiation therapy   . Ulcers of both lower extremities ([facility])    chronic  . Venous stasis    bilateral    SURGICAL HISTORY: Past Surgical History:  Procedure Laterality Date  . ABDOMINAL HYSTERECTOMY  01/18/13   robotic, bso  . BREAST LUMPECTOMY Right   . COLONOSCOPY WITH PROPOFOL N/A 09/16/2017   Procedure: COLONOSCOPY WITH PROPOFOL;  Surgeon: Wilford Corner, MD;  Location: [facility];  Service: Endoscopy;  Laterality: N/A;  Needs STAT CBC drawn before procedure  . MASTECTOMY Left 1994   left , Tamoxifen x 17 yrs  . partial mastectomy Right 2007   radiation    SOCIAL HISTORY: Social History   Socioeconomic History  . Marital status: Widowed    Spouse name: Not on file  . Number of children: 6  . Years of education: Not on file  . Highest education level: Not on file  Occupational History  . Occupation: Retired   Scientific laboratory technician  . Financial resource strain: Not on file  . Food insecurity:    Worry: Not on file    Inability: Not on file  . Transportation needs:    Medical: Not on file    Non-medical: Not on file  Tobacco Use  . Smoking status: Never Smoker  . Smokeless tobacco: Never Used  Substance and Sexual Activity  . Alcohol use: No  . Drug use: No  . Sexual activity: Never  Lifestyle  . Physical activity:    Days per week: Not on file    Minutes per session: Not on file  . Stress: Not on file  Relationships  . Social connections:    Talks on phone: Not on file    Gets together: Not on file    Attends religious  service: Not on file    Active member of club or organization: Not on file    Attends meetings of clubs or organizations: Not on file    Relationship status: Not on file  . Intimate partner violence:    Fear of current or ex partner: Not on file    Emotionally abused: Not on file    Physically abused: Not on file    Forced sexual activity: Not on file  Other Topics Concern  . Not on file  Social History Narrative   2 caffeine drinks daily     FAMILY HISTORY: Family History  Problem Relation Age of Onset  . Cancer Father  skin  . Colon cancer Brother   . Cancer Brother        prostate  . Heart disease Brother   . Breast cancer Daughter   . Colon cancer Sister   . Heart disease Sister        MI  . Breast cancer Sister   . Anuerysm Sister     ALLERGIES:  has No Known Allergies.  MEDICATIONS:  Current Outpatient Medications  Medication Sig Dispense Refill  . amiloride-hydrochlorothiazide (MODURETIC) 5-50 MG tablet Take 0.5 tablets by mouth daily.     . Cholecalciferol (VITAMIN D3) 1000 UNITS CAPS Take 1,000 Units by mouth daily.     . Cyanocobalamin (VITAMIN B 12 PO) Take by mouth.    . ferrous sulfate 325 (65 FE) MG tablet Take 1 tablet (325 mg total) by mouth 2 (two) times daily with a meal. 60 tablet 5  . FLUoxetine (PROZAC) 10 MG capsule TK 1 C PO QD  0  . KLOR-CON M10 10 MEQ tablet Take 10 mEq by mouth daily.     . Magnesium 250 MG TABS Take 1 tablet by mouth daily.    . Multiple Vitamins-Minerals (CENTRUM SILVER PO) Take 1 tablet by mouth daily.      . Omega-3 Fatty Acids (FISH OIL) 1000 MG CAPS Take 1 capsule by mouth daily.      . pantoprazole (PROTONIX) 40 MG tablet Take 1 tablet (40 mg total) by mouth daily. 30 tablet 0  . traZODone (DESYREL) 50 MG tablet Take 25 mg by mouth at bedtime. TAKES 1/2 TABLET    . vitamin C (ASCORBIC ACID) 500 MG tablet Take 500 mg by mouth daily.     No current facility-administered medications for this visit.     REVIEW OF  SYSTEMS:    A 10+ POINT REVIEW OF SYSTEMS WAS OBTAINED including neurology, dermatology, psychiatry, cardiac, respiratory, lymph, extremities, GI, GU, Musculoskeletal, constitutional, breasts, reproductive, HEENT.  All pertinent positives are noted in the HPI.  All others are negative.    PHYSICAL EXAMINATION:  Vitals:   06/19/18 1400  BP: (!) 155/66  Pulse: 61  Resp: 18  Temp: 97.6 F (36.4 C)  SpO2: 99%   Filed Weights   06/19/18 1400  Weight: 188 lb 8 oz (85.5 kg)  .  GENERAL:alert, in no acute distress and comfortable SKIN: no acute rashes, no significant lesions EYES: conjunctiva are pink and non-injected, sclera anicteric OROPHARYNX: MMM, no exudates, no oropharyngeal erythema or ulceration NECK: supple, no JVD LYMPH:  no palpable lymphadenopathy in the cervical, axillary or inguinal regions LUNGS: clear to auscultation b/l with normal respiratory effort HEART: regular rate & rhythm ABDOMEN:  normoactive bowel sounds , non tender, not distended. No palpable hepatosplenomegaly.  Extremity: no pedal edema PSYCH: alert & oriented x 3 with fluent speech NEURO: no focal motor/sensory deficits   LABORATORY DATA:  I have reviewed the data as listed  . CBC Latest Ref Rng & Units 06/19/2018 03/20/2018 01/02/2018  WBC 3.9 - 10.3 K/uL 6.3 6.0 7.4  Hemoglobin 11.6 - 15.9 g/dL 10.8(L) 10.3(L) 11.5(L)  Hematocrit 34.8 - 46.6 % 32.4(L) 32.0(L) 35.7  Platelets 145 - 400 K/uL 216 210 239   . CMP Latest Ref Rng & Units 06/19/2018 01/02/2018 09/06/2017  Glucose 70 - 99 mg/dL 82 85 93  BUN 8 - 23 mg/dL _0 Creatinine 0.44 - 1.00 mg/dL 0.91 1.07 0.96  Sodium 135 - 145 mmol/L 140 141 139  Potassium 3.5 - 5.1  mmol/L 4.4 4.5 3.9  Chloride 98 - 111 mmol/L 105 103 106  CO2 22 - 32 mmol/L _0 Calcium 8.9 - 10.3 mg/dL 9.9 10.3 8.7(L)  Total Protein 6.5 - 8.1 g/dL 7.0 7.1 -  Total Bilirubin 0.3 - 1.2 mg/dL 0.5 0.5 -  Alkaline Phos 38 - 126 U/L 67 68 -  AST 15 - 41 U/L 13(L)  14 -  ALT 0 - 44 U/L 8 9 -   . Lab Results  Component Value Date   IRON 86 06/19/2018   TIBC 289 06/19/2018   IRONPCTSAT 30 06/19/2018   (Iron and TIBC)  Lab Results  Component Value Date   FERRITIN 84 03/20/2018   Component     Latest Ref Rng & Units 06/19/2018  IgG (Immunoglobin G), Serum     700 - 1,600 mg/dL 910  IgA     64 - 422 mg/dL 322  IgM (Immunoglobulin M), Srm     26 - 217 mg/dL 75  Total Protein ELP     6.0 - 8.5 g/dL 6.4  Albumin SerPl Elph-Mcnc     2.9 - 4.4 g/dL 3.2  Alpha 1     0.0 - 0.4 g/dL 0.3  Alpha2 Glob SerPl Elph-Mcnc     0.4 - 1.0 g/dL 0.9  B-Globulin SerPl Elph-Mcnc     0.7 - 1.3 g/dL 1.2  Gamma Glob SerPl Elph-Mcnc     0.4 - 1.8 g/dL 0.9  M Protein SerPl Elph-Mcnc     Not Observed g/dL Not Observed  Globulin, Total     2.2 - 3.9 g/dL 3.2  Albumin/Glob SerPl     0.7 - 1.7 1.1  IFE 1      Comment  Please Note (HCV):      Comment  Kappa free light chain     3.3 - 19.4 mg/L 25.7 (H)  Lamda free light chains     5.7 - 26.3 mg/L 13.6  Kappa, lamda light chain ratio     0.26 - 1.65 1.89 (H)  Haptoglobin     34 - 200 mg/dL 221 (H)  LDH     98 - 192 U/L 158  Ferritin     11 - 307 ng/mL 94    Component     Latest Ref Rng & Units 01/02/2018  IgG (Immunoglobin G), Serum     700 - 1,600 mg/dL 881  IgA     64 - 422 mg/dL 327  IgM (Immunoglobulin M), Srm     26 - 217 mg/dL 72  Total Protein ELP     6.0 - 8.5 g/dL 6.8  Albumin SerPl Elph-Mcnc     2.9 - 4.4 g/dL 3.7  Alpha 1     0.0 - 0.4 g/dL 0.2  Alpha2 Glob SerPl Elph-Mcnc     0.4 - 1.0 g/dL 0.9  B-Globulin SerPl Elph-Mcnc     0.7 - 1.3 g/dL 1.2  Gamma Glob SerPl Elph-Mcnc     0.4 - 1.8 g/dL 0.7  M Protein SerPl Elph-Mcnc     Not Observed g/dL 0.2 (H)  Globulin, Total     2.2 - 3.9 g/dL 3.1  Albumin/Glob SerPl     0.7 - 1.7 1.2  IFE 1      Comment  Please Note (HCV):      Comment  Folate, Hemolysate     Not Estab. ng/mL 564.4  HCT     34.0 - 46.6 % 35.7  Folate,  RBC     >  498 ng/mL 1,581  Retic Ct Pct     0.7 - 2.1 % 1.0  RBC.     3.70 - 5.45 MIL/uL 4.14  Retic Count, Absolute     33.7 - 90.7 K/uL 41.4  Vitamin B12     180 - 914 pg/mL 1,566 (H)   Multiple Myeloma Panel (SPEP&IFE w/QIG)      The value has a corrected status.      No reference range information available      Resulting Lab:  CLINICAL LABORATORY      Comments:           (NOTE)           Immunofixation shows IgG monoclonal protein with kappa light           chain    RADIOGRAPHIC STUDIES: I have personally reviewed the radiological images as listed and agreed with the findings in the report. Mm 3d Screen Breast Uni Right  Result Date: 05/31/2018 CLINICAL DATA:  Screening. EXAM: DIGITAL SCREENING UNILATERAL RIGHT MAMMOGRAM WITH CAD AND TOMO COMPARISON:  Previous exam(s). ACR Breast Density Category b: There are scattered areas of fibroglandular density. FINDINGS: There are no findings suspicious for malignancy. Stable post lumpectomy changes. Images were processed with CAD. IMPRESSION: No mammographic evidence of malignancy. A result letter of this screening mammogram will be mailed directly to the patient. RECOMMENDATION: Screening mammogram in one year. (Code:SM-B-01Y) BI-RADS CATEGORY  2: Benign. Electronically Signed   By: Lajean Manes M.D.   On: 05/31/2018 16:44    ASSESSMENT & PLAN:   [name] is a 84 y.o. female with  1. Anemia - primarily from Iron deficiency hgb has significantly improved to 11.5 from a low of 7.2 (due to diverticular bleeding). Iron deficiency correcting with po iron replacement. ferriti is upto 9 PLAN: -monitor and f/u with PCP/GI to monitor for diverticular bleeding. - no indication of Myeloma. Rpt SPEP with no M spike.IFE neg.  -no evidence of hemolysis. B12 levels and RBC folate - WNL -Likely diverticulosis is at play that is causing undetected blood losses.  -Discussed pt labwork today, 06/19/18; HGB improved to 10.8,  Saturation ratio increased to 30%. Ferritin is 94 -continue PO Iron Polysaccharide from once a day to BID -Will see pt back in 6 months and if all continues to be stable, will discharge the pt back to PCP -No indication for blood transfusion or IV Iron at this time  2. IgG Kappa M spike of 0.2g/dl- likely MGUS. PLAN: MMP 06/19/18 is no M spike. Normal IFE   RTC with Dr Irene Limbo in 6 months with labs   All questions were answered. The patient knows to call the clinic with any problems, questions or concerns.  The total time spent in the appt was 15 minutes and more than 50% was on counseling and direct patient cares.    Sullivan Lone MD [place] AAHIVMS [facility] Hematology/Oncology Physician [facility]  (Office):       [phone] (Work cell):  [phone] (Fax):           [phone]  I, Baldwin [place], am acting as a scribe for Dr Irene Limbo.   .I have reviewed the above documentation for accuracy and completeness, and I agree with the above. Brunetta Genera MD

## 2018-06-19 ENCOUNTER — Inpatient Hospital Stay: Payer: Medicare Other | Attending: Hematology | Admitting: Hematology

## 2018-06-19 ENCOUNTER — Telehealth: Payer: Self-pay | Admitting: Hematology

## 2018-06-19 ENCOUNTER — Inpatient Hospital Stay: Payer: Medicare Other

## 2018-06-19 VITALS — BP 155/66 | HR 61 | Temp 97.6°F | Resp 18 | Ht 66.0 in | Wt 188.5 lb

## 2018-06-19 DIAGNOSIS — D472 Monoclonal gammopathy: Secondary | ICD-10-CM | POA: Insufficient documentation

## 2018-06-19 DIAGNOSIS — D649 Anemia, unspecified: Secondary | ICD-10-CM

## 2018-06-19 DIAGNOSIS — Z923 Personal history of irradiation: Secondary | ICD-10-CM | POA: Diagnosis not present

## 2018-06-19 DIAGNOSIS — Z800 Family history of malignant neoplasm of digestive organs: Secondary | ICD-10-CM | POA: Diagnosis not present

## 2018-06-19 DIAGNOSIS — Z90710 Acquired absence of both cervix and uterus: Secondary | ICD-10-CM | POA: Diagnosis not present

## 2018-06-19 DIAGNOSIS — Z9012 Acquired absence of left breast and nipple: Secondary | ICD-10-CM | POA: Diagnosis not present

## 2018-06-19 DIAGNOSIS — Z8542 Personal history of malignant neoplasm of other parts of uterus: Secondary | ICD-10-CM | POA: Insufficient documentation

## 2018-06-19 DIAGNOSIS — Z853 Personal history of malignant neoplasm of breast: Secondary | ICD-10-CM | POA: Diagnosis not present

## 2018-06-19 DIAGNOSIS — D509 Iron deficiency anemia, unspecified: Secondary | ICD-10-CM | POA: Diagnosis not present

## 2018-06-19 LAB — IRON AND TIBC
IRON: 86 ug/dL (ref 41–142)
SATURATION RATIOS: 30 % (ref 21–57)
TIBC: 289 ug/dL (ref 236–444)
UIBC: 202 ug/dL

## 2018-06-19 LAB — RETICULOCYTES
RBC.: 3.74 MIL/uL (ref 3.70–5.45)
RETIC CT PCT: 1.2 % (ref 0.7–2.1)
Retic Count, Absolute: 44.9 10*3/uL (ref 33.7–90.7)

## 2018-06-19 LAB — CBC WITH DIFFERENTIAL (CANCER CENTER ONLY)
BASOS ABS: 0 10*3/uL (ref 0.0–0.1)
BASOS PCT: 0 %
EOS ABS: 0 10*3/uL (ref 0.0–0.5)
EOS PCT: 0 %
HCT: 32.4 % — ABNORMAL LOW (ref 34.8–46.6)
Hemoglobin: 10.8 g/dL — ABNORMAL LOW (ref 11.6–15.9)
Lymphocytes Relative: 17 %
Lymphs Abs: 1.1 10*3/uL (ref 0.9–3.3)
MCH: 28.9 pg (ref 25.1–34.0)
MCHC: 33.3 g/dL (ref 31.5–36.0)
MCV: 86.6 fL (ref 79.5–101.0)
MONO ABS: 0.4 10*3/uL (ref 0.1–0.9)
Monocytes Relative: 7 %
Neutro Abs: 4.8 10*3/uL (ref 1.5–6.5)
Neutrophils Relative %: 76 %
PLATELETS: 216 10*3/uL (ref 145–400)
RBC: 3.74 MIL/uL (ref 3.70–5.45)
RDW: 15.3 % — AB (ref 11.2–14.5)
WBC: 6.3 10*3/uL (ref 3.9–10.3)

## 2018-06-19 LAB — CMP (CANCER CENTER ONLY)
ALT: 8 U/L (ref 0–44)
AST: 13 U/L — AB (ref 15–41)
Albumin: 3.7 g/dL (ref 3.5–5.0)
Alkaline Phosphatase: 67 U/L (ref 38–126)
Anion gap: 7 (ref 5–15)
BUN: 21 mg/dL (ref 8–23)
CO2: 28 mmol/L (ref 22–32)
CREATININE: 0.91 mg/dL (ref 0.44–1.00)
Calcium: 9.9 mg/dL (ref 8.9–10.3)
Chloride: 105 mmol/L (ref 98–111)
GFR, EST NON AFRICAN AMERICAN: 54 mL/min — AB (ref >60)
GFR, Est AFR Am: >60 mL/min (ref >60)
Glucose, Bld: 82 mg/dL (ref 70–99)
POTASSIUM: 4.4 mmol/L (ref 3.5–5.1)
SODIUM: 140 mmol/L (ref 135–145)
Total Bilirubin: 0.5 mg/dL (ref 0.3–1.2)
Total Protein: 7 g/dL (ref 6.5–8.1)

## 2018-06-19 LAB — LACTATE DEHYDROGENASE: LDH: 158 U/L (ref 98–192)

## 2018-06-19 MED ORDER — FERROUS SULFATE 325 (65 FE) MG PO TABS: 325.0000 mg | ORAL_TABLET | Freq: Two times a day (BID) | ORAL | 5 refills | Status: DC

## 2018-06-19 NOTE — Telephone Encounter (Signed)
Scheduled appt per 7/29 los - gave patient AVS and calender per los.  

## 2018-06-20 LAB — KAPPA/LAMBDA LIGHT CHAINS
KAPPA FREE LGHT CHN: 25.7 mg/L — AB (ref 3.3–19.4)
KAPPA, LAMDA LIGHT CHAIN RATIO: 1.89 — AB (ref 0.26–1.65)
LAMDA FREE LIGHT CHAINS: 13.6 mg/L (ref 5.7–26.3)

## 2018-06-20 LAB — HAPTOGLOBIN: HAPTOGLOBIN: 221 mg/dL — AB (ref 34–200)

## 2018-06-20 LAB — FERRITIN: Ferritin: 94 ng/mL (ref 11–307)

## 2018-06-21 LAB — MULTIPLE MYELOMA PANEL, SERUM
ALBUMIN/GLOB SERPL: 1.1 (ref 0.7–1.7)
ALPHA 1: 0.3 g/dL (ref 0.0–0.4)
ALPHA2 GLOB SERPL ELPH-MCNC: 0.9 g/dL (ref 0.4–1.0)
Albumin SerPl Elph-Mcnc: 3.2 g/dL (ref 2.9–4.4)
B-Globulin SerPl Elph-Mcnc: 1.2 g/dL (ref 0.7–1.3)
GAMMA GLOB SERPL ELPH-MCNC: 0.9 g/dL (ref 0.4–1.8)
GLOBULIN, TOTAL: 3.2 g/dL (ref 2.2–3.9)
IGG (IMMUNOGLOBIN G), SERUM: 910 mg/dL (ref 700–1600)
IgA: 322 mg/dL (ref 64–422)
IgM (Immunoglobulin M), Srm: 75 mg/dL (ref 26–217)
Total Protein ELP: 6.4 g/dL (ref 6.0–8.5)

## 2018-07-06 ENCOUNTER — Encounter: Payer: Self-pay | Admitting: Genetics

## 2018-08-08 ENCOUNTER — Emergency Department (HOSPITAL_COMMUNITY)
Admission: EM | Admit: 2018-08-08 | Discharge: 2018-08-08 | Disposition: A | Discharge status: Home / Self Care | Payer: Medicare Other | Attending: Emergency Medicine | Admitting: Emergency Medicine

## 2018-08-08 ENCOUNTER — Emergency Department (HOSPITAL_COMMUNITY): Payer: Medicare Other

## 2018-08-08 ENCOUNTER — Encounter (HOSPITAL_COMMUNITY): Payer: Self-pay | Admitting: Emergency Medicine

## 2018-08-08 DIAGNOSIS — R600 Localized edema: Secondary | ICD-10-CM | POA: Insufficient documentation

## 2018-08-08 DIAGNOSIS — Z79899 Other long term (current) drug therapy: Secondary | ICD-10-CM | POA: Diagnosis not present

## 2018-08-08 DIAGNOSIS — J90 Pleural effusion, not elsewhere classified: Secondary | ICD-10-CM

## 2018-08-08 DIAGNOSIS — R609 Edema, unspecified: Secondary | ICD-10-CM

## 2018-08-08 DIAGNOSIS — R7989 Other specified abnormal findings of blood chemistry: Secondary | ICD-10-CM | POA: Diagnosis present

## 2018-08-08 DIAGNOSIS — I10 Essential (primary) hypertension: Secondary | ICD-10-CM | POA: Insufficient documentation

## 2018-08-08 LAB — COMPREHENSIVE METABOLIC PANEL
ALK PHOS: 51 U/L (ref 38–126)
ALT: 16 U/L (ref 0–44)
ANION GAP: 8 (ref 5–15)
AST: 17 U/L (ref 15–41)
Albumin: 3.2 g/dL — ABNORMAL LOW (ref 3.5–5.0)
BILIRUBIN TOTAL: 0.8 mg/dL (ref 0.3–1.2)
BUN: 19 mg/dL (ref 8–23)
CALCIUM: 9.3 mg/dL (ref 8.9–10.3)
CO2: 30 mmol/L (ref 22–32)
CREATININE: 0.93 mg/dL (ref 0.44–1.00)
Chloride: 105 mmol/L (ref 98–111)
GFR calc non Af Amer: 52 mL/min — ABNORMAL LOW (ref >60)
GLUCOSE: 83 mg/dL (ref 70–99)
Potassium: 4.2 mmol/L (ref 3.5–5.1)
Sodium: 143 mmol/L (ref 135–145)
TOTAL PROTEIN: 6.5 g/dL (ref 6.5–8.1)

## 2018-08-08 LAB — CBC WITH DIFFERENTIAL/PLATELET
BASOS ABS: 0 10*3/uL (ref 0.0–0.1)
BASOS PCT: 0 %
EOS ABS: 0 10*3/uL (ref 0.0–0.7)
EOS PCT: 1 %
HCT: 30.8 % — ABNORMAL LOW (ref 36.0–46.0)
HEMOGLOBIN: 10.2 g/dL — AB (ref 12.0–15.0)
LYMPHS ABS: 0.9 10*3/uL (ref 0.7–4.0)
Lymphocytes Relative: 18 %
MCH: 28.7 pg (ref 26.0–34.0)
MCHC: 33.1 g/dL (ref 30.0–36.0)
MCV: 86.8 fL (ref 78.0–100.0)
Monocytes Absolute: 0.5 10*3/uL (ref 0.1–1.0)
Monocytes Relative: 9 %
Neutro Abs: 3.7 10*3/uL (ref 1.7–7.7)
Neutrophils Relative %: 72 %
PLATELETS: 218 10*3/uL (ref 150–400)
RBC: 3.55 MIL/uL — ABNORMAL LOW (ref 3.87–5.11)
RDW: 15.4 % (ref 11.5–15.5)
WBC: 5.1 10*3/uL (ref 4.0–10.5)

## 2018-08-08 LAB — URINALYSIS, ROUTINE W REFLEX MICROSCOPIC
BILIRUBIN URINE: NEGATIVE
Glucose, UA: NEGATIVE mg/dL
HGB URINE DIPSTICK: NEGATIVE
KETONES UR: NEGATIVE mg/dL
Leukocytes, UA: NEGATIVE
NITRITE: NEGATIVE
PH: 7 (ref 5.0–8.0)
Protein, ur: NEGATIVE mg/dL
Specific Gravity, Urine: 1.01 (ref 1.005–1.030)

## 2018-08-08 LAB — BRAIN NATRIURETIC PEPTIDE: B Natriuretic Peptide: 183.8 pg/mL — ABNORMAL HIGH (ref 0.0–100.0)

## 2018-08-08 MED ORDER — FUROSEMIDE 10 MG/ML IJ SOLN
20.0000 mg | Freq: Once | INTRAMUSCULAR | Status: AC
  Administered 2018-08-08: 20 mg via INTRAVENOUS
  Filled 2018-08-08: qty 4

## 2018-08-08 MED ORDER — IOHEXOL 300 MG/ML  SOLN
75.0000 mL | Freq: Once | INTRAMUSCULAR | Status: AC | PRN
  Administered 2018-08-08: 75 mL via INTRAVENOUS

## 2018-08-08 MED ORDER — FUROSEMIDE 20 MG PO TABS: 20.0000 mg | ORAL_TABLET | Freq: Every day | ORAL | 0 refills | Status: DC

## 2018-08-08 NOTE — ED Notes (Signed)
Call made to portable for 4 4 inch coban requested by ortho tech. Will call ortho tech back once coban has arrived.

## 2018-08-08 NOTE — ED Provider Notes (Signed)
[facility] DEPT Provider Note   CSN: [id number] Arrival date & time: 08/08/18  1403     History   Chief Complaint Chief Complaint  Patient presents with  . abnormal chest xray  . Leg Swelling    HPI [name] is a 84 y.o. female resenting for evaluation of bilateral leg swelling and abnormal chest x-ray.  Patient states the past 2 weeks, she has been having gradually increasing bilateral leg swelling.  She has had a weight gain of 13 pounds in the past 2 months.  She was seen by her PCP, where a chest x-ray was performed which was abnormal, and she was told to come to the ER.  Patient denies fevers, chills, cough, chest pain, shortness of breath.  She denies orthopnea or dyspnea on exertion.  She denies nausea, vomiting, abdominal pain.  She has been urinating without symptoms, no abnormal bowel movements.  Patient has a history of breast cancer in remission since 2007, history of endometrial adenocarcinoma, also in remission.  No current known cancer or treatment.  Patient family states she used to be on a fluid pill, HCTZ, but this was discontinued in April when she was having dizzy spells.  Her PCP considered restarting it today.  Additional history obtained from family and chart review. Patient with a history of chronic anemia.  This began after a diverticular bleed, hemoglobin has remained low in the tens. Last colonoscopy 08/2017.  HPI  Past Medical History:  Diagnosis Date  . Anxiety   . Breast cancer ([place])    left 1994, right 2007  . Diverticulosis   . Diverticulosis of colon with hemorrhage 06/19/2014  . DVT (deep venous thrombosis) ([place]) 1992   right leg x 2  . Endometrial adenocarcinoma ([place]) 12/25/12   biopsy  . Family history of malignant neoplasm of gastrointestinal tract   . GERD (gastroesophageal reflux disease)   . History of blood transfusion   . Hx of radiation therapy 02/2006 - 03/2006   right breast  . Hx of radiation  therapy 5/12, 5/19, 5/29, 6/2, 04/30/2013   proximal vagina, 30 Gy in 5 sessions, HDR  . Hypertension   . Iron deficiency   . Peripheral vascular disease ([facility])    leg wounds  . Personal history of radiation therapy   . Ulcers of both lower extremities ([facility])    chronic  . Venous stasis    bilateral    Patient Active Problem List   Diagnosis Date Noted  . Lower GI bleed 09/05/2017  . Diverticulosis of colon with hemorrhage 06/19/2014  . Rectal bleed 06/18/2014  . Essential hypertension 06/18/2014  . Acute lower GI bleeding 06/18/2014  . Endometrial adenocarcinoma ([place])   . Hx of radiation therapy   . Breast cancer ([employer])   . DCIS (ductal carcinoma in situ) of breast 11/05/2011  . Family history of malignant neoplasm of gastrointestinal tract 08/17/2011  . Odynophagia 08/17/2011    Past Surgical History:  Procedure Laterality Date  . ABDOMINAL HYSTERECTOMY  01/18/13   robotic, bso  . BREAST LUMPECTOMY Right   . COLONOSCOPY WITH PROPOFOL N/A 09/16/2017   Procedure: COLONOSCOPY WITH PROPOFOL;  Surgeon: Wilford Corner, MD;  Location: [facility];  Service: Endoscopy;  Laterality: N/A;  Needs STAT CBC drawn before procedure  . MASTECTOMY Left 1994   left , Tamoxifen x 17 yrs  . partial mastectomy Right 2007   radiation     OB History   None  Home Medications    Prior to Admission medications   Medication Sig Start Date End Date Taking? Authorizing Provider  amiloride-hydrochlorothiazide (MODURETIC) 5-50 MG tablet Take 0.5 tablets by mouth daily.  08/16/11  Yes [provider]  Cholecalciferol (VITAMIN D3) 1000 UNITS CAPS Take 1,000 Units by mouth daily.    Yes [provider]  Cyanocobalamin (VITAMIN B 12 PO) Take 1,000 mcg by mouth daily.    Yes [provider]  docusate sodium (COLACE) 100 MG capsule Take 100 mg by mouth daily as needed for mild constipation.   Yes [provider]  ferrous sulfate 325 (65 FE) MG tablet Take 1  tablet (325 mg total) by mouth 2 (two) times daily with a meal. 06/19/18  Yes Brunetta Genera, MD  FLUoxetine (PROZAC) 10 MG capsule Take 10 mg by mouth daily.  04/03/18  Yes [provider]  Magnesium 250 MG TABS Take 1 tablet by mouth daily.   Yes [provider]  Multiple Vitamins-Minerals (CENTRUM SILVER PO) Take 1 tablet by mouth daily.     Yes [provider]  Omega-3 Fatty Acids (FISH OIL) 1000 MG CAPS Take 1 capsule by mouth daily.     Yes [provider]  pantoprazole (PROTONIX) 40 MG tablet Take 1 tablet (40 mg total) by mouth daily. 09/09/17  Yes Florencia Reasons, MD  traZODone (DESYREL) 50 MG tablet Take 25 mg by mouth at bedtime as needed for sleep. TAKES 1/2 TABLET    Yes [provider]  vitamin C (ASCORBIC ACID) 500 MG tablet Take 500 mg by mouth daily.   Yes [provider]  furosemide (LASIX) 20 MG tablet Take 1 tablet (20 mg total) by mouth daily. 08/08/18   Branton Einstein, PA-C    Family History Family History  Problem Relation Age of Onset  . Cancer Father        skin  . Colon cancer Brother   . Cancer Brother        prostate  . Heart disease Brother   . Breast cancer Daughter   . Colon cancer Sister   . Heart disease Sister        MI  . Breast cancer Sister   . Anuerysm Sister     Social History Social History   Tobacco Use  . Smoking status: Never Smoker  . Smokeless tobacco: Never Used  Substance Use Topics  . Alcohol use: No  . Drug use: No     Allergies   Patient has no known allergies.   Review of Systems Review of Systems  Cardiovascular: Positive for leg swelling.  All other systems reviewed and are negative.    Physical Exam Updated Vital Signs BP (!) 185/83   Pulse 68   Temp (!) 97.5 F (36.4 C)   Resp (!) 22   SpO2 99%   Physical Exam  Constitutional: She is oriented to person, place, and time. She appears well-developed and well-nourished. No distress.  Elderly female  resting comfortably in the bed in no acute distress  HENT:  Head: Normocephalic and atraumatic.  Eyes: Pupils are equal, round, and reactive to light. Conjunctivae and EOM are normal.  Neck: Normal range of motion. Neck supple.  Cardiovascular: Normal rate, regular rhythm and intact distal pulses.  Pulmonary/Chest: Effort normal. No respiratory distress. She has no wheezes. She has rales.  Speaking in full sentences.  Mild rales heard bilateral lower lungs.  No respiratory distress  Abdominal: Soft. She exhibits no distension  and no mass. There is no tenderness. There is no guarding.  Musculoskeletal: Normal range of motion. She exhibits edema and tenderness.  2-3+ pitting edema bilaterally.  Pedal pulses intact.  Mild erythema.  Chronic appearing ulcers of the bilateral inner legs.  Neurological: She is alert and oriented to person, place, and time. No sensory deficit.  Skin: Skin is warm and dry. Capillary refill takes less than 2 seconds.  Psychiatric: She has a normal mood and affect.  Nursing note and vitals reviewed.    ED Treatments / Results  Labs (all labs ordered are listed, but only abnormal results are displayed) Labs Reviewed  CBC WITH DIFFERENTIAL/PLATELET - Abnormal; Notable for the following components:      Result Value   RBC 3.55 (*)    Hemoglobin 10.2 (*)    HCT 30.8 (*)    All other components within normal limits  COMPREHENSIVE METABOLIC PANEL - Abnormal; Notable for the following components:   Albumin 3.2 (*)    GFR calc non Af Amer 52 (*)    All other components within normal limits  BRAIN NATRIURETIC PEPTIDE - Abnormal; Notable for the following components:   B Natriuretic Peptide 183.8 (*)    All other components within normal limits  URINALYSIS, ROUTINE W REFLEX MICROSCOPIC - Abnormal; Notable for the following components:   Color, Urine COLORLESS (*)    All other components within normal limits  URINE CULTURE    EKG EKG  Interpretation  Date/Time:  Tuesday August 08 2018 17:55:17 EDT Ventricular Rate:  69 PR Interval:    QRS Duration: 85 QT Interval:  426 QTC Calculation: 457 R Axis:   54 Text Interpretation:  Sinus rhythm Anteroseptal infarct, old Nonspecific repol abnormality, lateral leads new t wave inversions v5 and v6 since 2007 Confirmed by Isla Pence [phone]) on 08/08/2018 5:58:52 PM   Radiology Dg Chest 2 View  Result Date: 08/08/2018 CLINICAL DATA:  Bilateral lower extremity swelling, history of breast and endometrial cancer EXAM: CHEST - 2 VIEW COMPARISON:  Chest radiograph from earlier today. FINDINGS: Surgical clips overlie the left axilla. Stable mild cardiomegaly. Mild asymmetric prominence of the right hilum is slightly less prominent compared to the chest radiograph from earlier today, however appears mildly increased from 02/14/2014 chest radiograph. Otherwise stable normal mediastinal contour. No pneumothorax. Trace bilateral pleural effusions. Mild pulmonary edema. IMPRESSION: 1. Mild congestive heart failure. 2. Trace bilateral pleural effusions. 3. Persistent asymmetric prominence of the right hilum, which is indeterminate for vascular prominence, adenopathy or mass. Chest CT with IV contrast again suggested, particularly given the history of breast and endometrial cancer. Electronically Signed   By: Ilona Sorrel M.D.   On: 08/08/2018 18:47   Ct Chest W Contrast  Result Date: 08/08/2018 CLINICAL DATA:  Bilateral lower extremity swelling. Abnormal appearance of the right hilum on chest radiographs. History of breast and endometrial cancer. EXAM: CT CHEST WITH CONTRAST TECHNIQUE: Multidetector CT imaging of the chest was performed during intravenous contrast administration. CONTRAST:  42mL OMNIPAQUE IOHEXOL 300 MG/ML  SOLN COMPARISON:  Chest radiographs 08/08/2018. CT abdomen and pelvis 06/18/2014. FINDINGS: Cardiovascular: Enlarged main pulmonary artery, 3.9 cm diameter. No evidence of  central pulmonary emboli on this nondedicated study. Mild thoracic aortic atherosclerosis without aneurysm. Mild cardiomegaly. No pericardial effusion. Mediastinum/Nodes: Left mastectomy and axillary dissection. Small bilateral thyroid nodules. Mild right hilar lymphadenopathy with nodes measuring up to 10 mm in short axis. No enlarged mediastinal lymph nodes. Unremarkable esophagus. Lungs/Pleura: Trace bilateral pleural effusions. No pneumothorax.  Mild interlobular septal thickening. Bronchial wall thickening. Asymmetric soft tissue around right lower lobe bronchi. Patchy consolidation in the lower lobes. Mild dependent atelectasis bilaterally. Upper Abdomen: Low-density in the upper pole of the left kidney with cyst shown on the prior abdominal CT. Chronic left adrenal gland thickening. Musculoskeletal: No acute osseous abnormality or suspicious osseous lesion. IMPRESSION: 1. Interlobular septal thickening and trace pleural effusions consistent with mild CHF as shown on earlier radiographs. 2. Patchy consolidation in both lower lobes may reflect superimposed pneumonia. 3. Mild right hilar lymphadenopathy, possibly reactive and related to #1 and #2. Consider follow-up chest CT in 3 months to exclude the less likely consideration of malignancy. 4. Enlarged main pulmonary artery suggesting pulmonary arterial hypertension. 5. Aortic Atherosclerosis (ICD10-I70.0). Electronically Signed   By: Logan Bores M.D.   On: 08/08/2018 20:03    Procedures Procedures (including critical care time)  Medications Ordered in ED Medications  furosemide (LASIX) injection 20 mg (20 mg Intravenous Given 08/08/18 1859)  iohexol (OMNIPAQUE) 300 MG/ML solution 75 mL (75 mLs Intravenous Contrast Given 08/08/18 1905)     Initial Impression / Assessment and Plan / ED Course  I have reviewed the triage vital signs and the nursing notes.  Pertinent labs & imaging results that were available during my care of the patient were  reviewed by me and considered in my medical decision making (see chart for details).     Patient presented for evaluation of bilateral leg swelling and abnormal chest x-ray.  Physical exam shows patient is afebrile not tachycardic.  Appears nontoxic.  No fevers, chills, cough, shortness of breath, chest pain.  Will obtain labs, urine, and chest x-ray for further evaluation.  Leg evaluation is equal bilaterally, and appears chronic.  No fevers, swelling, warmth, and ulcers without obvious infection.  Evaluated by PCP today, no antibiotic started.  Doubt cellulitis.  Case discussed with attending, Dr. Gilford Raid evaluated the patient.  Labs reassuring, no leukocytosis.  Hemoglobin low at 10.2, this appears baseline.  Creatinine stable.  BNP mildly elevated at 183.8, no baseline to compare.  Patient remains stable, she is not hypoxic or tachycardic.  No shortness of breath or chest pain.  Chest x-ray with effusions and abnormal mass, recommending CT for further evaluation. Lasix given.  CT shows pleural effusions and cardiomegaly.  No obvious mass or cancer.  Recommends repeat CT in 3 months to assess for resolution.  Shows possible superimposed infection, however patient without white count, fevers, chills, cough, chest pain, shortness of breath, doubt pneumonia.  Will not cover with antibiotics at this point time.  Discussed findings with patient and family.  Discussed likely diagnosis of CHF.  Discussed we will place patient on a fluid pill.  Patient to follow-up with PCP regarding medication management and further evaluation and management of CHF. PCP had wanted unna boot placement in the office, but pt sent to ER instead. Will place prior to d/c. At this time, pt appears safe for d/c. Return precautions given. Pt states she understands and agrees to plan.   Final Clinical Impressions(s) / ED Diagnoses   Final diagnoses:  Elevated brain natriuretic peptide (BNP) level  Pleural effusion  Peripheral  edema    ED Discharge Orders         Ordered    furosemide (LASIX) 20 MG tablet  Daily     08/08/18 2011           Franchot Heidelberg, PA-C 08/08/18 2211    Isla Pence, MD 08/08/18 2225

## 2018-08-08 NOTE — ED Notes (Signed)
[place] obtained and ortho tech notified.

## 2018-08-08 NOTE — ED Triage Notes (Addendum)
Pt sent from [facility] for increased bilat leg swelling for week and abnormal chest xray which showed fluid and a mass.  Pt Has bilat lateral leg ulcers, not currently on antibiotics for and is to see the wound center next Wed.

## 2018-08-08 NOTE — ED Notes (Signed)
Assisted patient to bathroom in wheelchair with 2 assist. Patient missed the the hat. Patient is now on the pure wick. Patient aware we need urine sample.

## 2018-08-08 NOTE — ED Notes (Signed)
PA at bedside.

## 2018-08-08 NOTE — Discharge Instructions (Addendum)
It is important that you call your primary care doctor tomorrow to discuss your visit in the ER today.  We would like to start you on Lasix, a fluid pill. Call to see if your primary care to confirm if they want you on the new blood pressure medicine as well (HCTZ).  You should also schedule an appointment for follow-up regarding your findings in the ER today. Your chest CT did not show an obvious cancer or mass, but recommends follow-up in 3 months for reevaluation. Return to the emergency room if you develop chest pain, difficulty breathing, or any new, worsening, or current concerning symptoms.

## 2018-08-08 NOTE — ED Notes (Signed)
Patient transported to CT 

## 2018-08-10 LAB — URINE CULTURE: Culture: NO GROWTH

## 2018-08-14 DIAGNOSIS — Z86718 Personal history of other venous thrombosis and embolism: Secondary | ICD-10-CM | POA: Insufficient documentation

## 2018-08-16 ENCOUNTER — Encounter (HOSPITAL_BASED_OUTPATIENT_CLINIC_OR_DEPARTMENT_OTHER): Payer: Medicare Other | Attending: Internal Medicine

## 2018-08-16 DIAGNOSIS — L97322 Non-pressure chronic ulcer of left ankle with fat layer exposed: Secondary | ICD-10-CM | POA: Diagnosis not present

## 2018-08-16 DIAGNOSIS — I872 Venous insufficiency (chronic) (peripheral): Secondary | ICD-10-CM | POA: Insufficient documentation

## 2018-08-16 DIAGNOSIS — I7389 Other specified peripheral vascular diseases: Secondary | ICD-10-CM | POA: Diagnosis not present

## 2018-08-16 DIAGNOSIS — L97822 Non-pressure chronic ulcer of other part of left lower leg with fat layer exposed: Secondary | ICD-10-CM | POA: Insufficient documentation

## 2018-08-16 DIAGNOSIS — L97312 Non-pressure chronic ulcer of right ankle with fat layer exposed: Secondary | ICD-10-CM | POA: Insufficient documentation

## 2018-08-16 DIAGNOSIS — I10 Essential (primary) hypertension: Secondary | ICD-10-CM | POA: Diagnosis not present

## 2018-08-23 ENCOUNTER — Encounter (HOSPITAL_BASED_OUTPATIENT_CLINIC_OR_DEPARTMENT_OTHER): Payer: Medicare Other | Attending: Physician Assistant

## 2018-08-23 DIAGNOSIS — L97312 Non-pressure chronic ulcer of right ankle with fat layer exposed: Secondary | ICD-10-CM | POA: Diagnosis present

## 2018-08-23 DIAGNOSIS — I10 Essential (primary) hypertension: Secondary | ICD-10-CM | POA: Insufficient documentation

## 2018-08-23 DIAGNOSIS — L97329 Non-pressure chronic ulcer of left ankle with unspecified severity: Secondary | ICD-10-CM | POA: Diagnosis not present

## 2018-08-23 DIAGNOSIS — I872 Venous insufficiency (chronic) (peripheral): Secondary | ICD-10-CM | POA: Insufficient documentation

## 2018-08-30 DIAGNOSIS — L97312 Non-pressure chronic ulcer of right ankle with fat layer exposed: Secondary | ICD-10-CM | POA: Diagnosis not present

## 2018-09-06 DIAGNOSIS — L97312 Non-pressure chronic ulcer of right ankle with fat layer exposed: Secondary | ICD-10-CM | POA: Diagnosis not present

## 2018-09-13 DIAGNOSIS — L97312 Non-pressure chronic ulcer of right ankle with fat layer exposed: Secondary | ICD-10-CM | POA: Diagnosis not present

## 2018-12-19 NOTE — Progress Notes (Signed)
HEMATOLOGY ONCOLOGY CLINIC NOTE  Date of Service: 12/20/18    Patient Care Team: Rankins, Bill Salinas, MD as PCP - General (Family Medicine)  CHIEF COMPLAINTS/PURPOSE OF CONSULTATION:  F/u for anemia  HISTORY OF PRESENTING ILLNESS:   [name] 85 y.o. female is here because of a referral from Dr. Jordan Hawks Rankins regarding anemia.   She is accompanied today by her daughter. The pt reports that she is doing well overall. She notes taking ferrous sulfate once per day since October 2018. She has been taking an acid supressant. She denies any blood in the stools, but notes very dark stools. She reports taking a multivitamin each day. She denies any abdominal pains. She stopped taking NSAIDs on 12/05/17. She is also taking Prozac.   She notes that she feels weak, and occasionally feels dizzy (she uses a cane). She notes that around the time of her diverticular bleed she felt worse. She takes 1200mg  fish oil once per day. She reports not having a history of receiving blood transfusions or a long history of anemia.   Of note prior to the patient's visit, pt has had a blood transfusion on 09/26/17. She also had a colonoscopy on 09/16/17 that revealed concerns for a diverticular bleed.   Most recent lab results of CBC on 09/21/17 showed RBC at 2.54, Hgb at 7.2, HCT at 22.3, RDW at 15.6.   On review of systems, pt reports having lost some weight (notes recently changing her diet to less meat), very dark stools, some fatigue, regular bowel movement, and denies abdominal pains, bone pains, blood in the stools, blood in the urine, gum bleeds, nose bleeds, bleeding, pain moving her bowels, and leg swelling.   On PMHx the pt reports ductal carcinoma in situ of right breast with + hormone reception s/p excision, Invasive left breast CA in 1994 s/p left masectomy s/p tamoxifen for 17 years, right leg DVT in 1992 and 2007, anemia, HTN, systolic ejection murmur, venous stasis ulcer, pre-diabetes, gout,  cervical DDD, endometrial cancer s/p hysterectomy, s/p radiation. She reports hematochezia in 2014, barium enema with diverticulosis, rectal bleeding in 2015. On FHx she reports family history of breast cancer. On Surgical Hx she reports left mastectomy in 1994, a right partial mastectomy in 2007 and a hysterectomy.   Interval History:   [name] returns to clinic today regarding her anemia. The patient's last visit with [place] was on 06/19/18. She is accompanied today by her daughter. The pt reports that she is doing well overall.   The pt reports that she was diagnosed with CHF in the interim. She notes that she continues to do all that she wants to do and endorses good energy levels.   The pt notes that she has been taking 325mg  Ferrous Sulfate every day in the interim. The pt denies any blood in the stools or black stools.  Lab results today (12/20/18) of CBC w/diff is as follows: all values are WNL except for RBC at 3.67, HGB at 10.5, HCT at 32.0, RDW at 15.8. 12/20/18 CMP is pending 12/20/18 MMP showed no M spike . Lab Results  Component Value Date   IRON 52 12/20/2018   TIBC 265 12/20/2018   IRONPCTSAT 20 (L) 12/20/2018   (Iron and TIBC)  Lab Results  Component Value Date   FERRITIN 114 12/20/2018     On review of systems, pt reports good energy levels, and denies blood in the stools, black stools, abdominal pains, leg swelling, and any other  symptoms.   MEDICAL HISTORY:  Past Medical History:  Diagnosis Date  . Anxiety   . Breast cancer ([place])    left 1994, right 2007  . Diverticulosis   . Diverticulosis of colon with hemorrhage 06/19/2014  . DVT (deep venous thrombosis) ([place]) 1992   right leg x 2  . Endometrial adenocarcinoma ([place]) 12/25/12   biopsy  . Family history of malignant neoplasm of gastrointestinal tract   . GERD (gastroesophageal reflux disease)   . History of blood transfusion   . Hx of radiation therapy 02/2006 - 03/2006   right breast  . Hx of  radiation therapy 5/12, 5/19, 5/29, 6/2, 04/30/2013   proximal vagina, 30 Gy in 5 sessions, HDR  . Hypertension   . Iron deficiency   . Peripheral vascular disease (HCC)    leg wounds  . Personal history of radiation therapy   . Ulcers of both lower extremities (HCC)    chronic  . Venous stasis    bilateral    SURGICAL HISTORY: Past Surgical History:  Procedure Laterality Date  . ABDOMINAL HYSTERECTOMY  01/18/13   robotic, bso  . BREAST LUMPECTOMY Right   . COLONOSCOPY WITH PROPOFOL N/A 09/16/2017   Procedure: COLONOSCOPY WITH PROPOFOL;  Surgeon: Wilford Corner, MD;  Location: [facility];  Service: Endoscopy;  Laterality: N/A;  Needs STAT CBC drawn before procedure  . MASTECTOMY Left 1994   left , Tamoxifen x 17 yrs  . partial mastectomy Right 2007   radiation    SOCIAL HISTORY: Social History   Socioeconomic History  . Marital status: Widowed    Spouse name: Not on file  . Number of children: 6  . Years of education: Not on file  . Highest education level: Not on file  Occupational History  . Occupation: Retired   Scientific laboratory technician  . Financial resource strain: Not on file  . Food insecurity:    Worry: Not on file    Inability: Not on file  . Transportation needs:    Medical: Not on file    Non-medical: Not on file  Tobacco Use  . Smoking status: Never Smoker  . Smokeless tobacco: Never Used  Substance and Sexual Activity  . Alcohol use: No  . Drug use: No  . Sexual activity: Never  Lifestyle  . Physical activity:    Days per week: Not on file    Minutes per session: Not on file  . Stress: Not on file  Relationships  . Social connections:    Talks on phone: Not on file    Gets together: Not on file    Attends religious service: Not on file    Active member of club or organization: Not on file    Attends meetings of clubs or organizations: Not on file    Relationship status: Not on file  . Intimate partner violence:    Fear of current or ex partner: Not  on file    Emotionally abused: Not on file    Physically abused: Not on file    Forced sexual activity: Not on file  Other Topics Concern  . Not on file  Social History Narrative   2 caffeine drinks daily     FAMILY HISTORY: Family History  Problem Relation Age of Onset  . Cancer Father        skin  . Colon cancer Brother   . Cancer Brother        prostate  . Heart disease Brother   .  Breast cancer Daughter   . Colon cancer Sister   . Heart disease Sister        MI  . Breast cancer Sister   . Anuerysm Sister     ALLERGIES:  has No Known Allergies.  MEDICATIONS:  Current Outpatient Medications  Medication Sig Dispense Refill  . amiloride-hydrochlorothiazide (MODURETIC) 5-50 MG tablet Take 0.5 tablets by mouth daily.     . Cholecalciferol (VITAMIN D3) 1000 UNITS CAPS Take 1,000 Units by mouth daily.     . Cyanocobalamin (VITAMIN B 12 PO) Take 1,000 mcg by mouth daily.     [name] docusate sodium (COLACE) 100 MG capsule Take 100 mg by mouth daily as needed for mild constipation.    . ferrous sulfate 325 (65 FE) MG tablet Take 1 tablet (325 mg total) by mouth 2 (two) times daily with a meal. 60 tablet 5  . FLUoxetine (PROZAC) 10 MG capsule Take 10 mg by mouth daily.   0  . furosemide (LASIX) 20 MG tablet Take 1 tablet (20 mg total) by mouth daily. 14 tablet 0  . Magnesium 250 MG TABS Take 1 tablet by mouth daily.    . Multiple Vitamins-Minerals (CENTRUM SILVER PO) Take 1 tablet by mouth daily.      . Omega-3 Fatty Acids (FISH OIL) 1000 MG CAPS Take 1 capsule by mouth daily.      . pantoprazole (PROTONIX) 40 MG tablet Take 1 tablet (40 mg total) by mouth daily. 30 tablet 0  . traZODone (DESYREL) 50 MG tablet Take 25 mg by mouth at bedtime as needed for sleep. TAKES 1/2 TABLET     . vitamin C (ASCORBIC ACID) 500 MG tablet Take 500 mg by mouth daily.     No current facility-administered medications for this visit.     REVIEW OF SYSTEMS:    A 10+ POINT REVIEW OF SYSTEMS WAS  OBTAINED including neurology, dermatology, psychiatry, cardiac, respiratory, lymph, extremities, GI, GU, Musculoskeletal, constitutional, breasts, reproductive, HEENT.  All pertinent positives are noted in the HPI.  All others are negative.    PHYSICAL EXAMINATION:  Vitals:   12/20/18 1200  BP: (!) 158/69  Pulse: 64  Resp: 18  Temp: (!) 97.5 F (36.4 C)  SpO2: 99%   Filed Weights   12/20/18 1200  Weight: 173 lb 11.2 oz (78.8 kg)  .  GENERAL:alert, in no acute distress and comfortable SKIN: no acute rashes, no significant lesions EYES: conjunctiva are pink and non-injected, sclera anicteric OROPHARYNX: MMM, no exudates, no oropharyngeal erythema or ulceration NECK: supple, no JVD LYMPH:  no palpable lymphadenopathy in the cervical, axillary or inguinal regions LUNGS: clear to auscultation b/l with normal respiratory effort HEART: regular rate & rhythm ABDOMEN:  normoactive bowel sounds , non tender, not distended. No palpable hepatosplenomegaly.  Extremity: no pedal edema PSYCH: alert & oriented x 3 with fluent speech NEURO: no focal motor/sensory deficits   LABORATORY DATA:  I have reviewed the data as listed  . CBC Latest Ref Rng & Units 12/20/2018 08/08/2018 06/19/2018  WBC 4.0 - 10.5 K/uL 5.2 5.1 6.3  Hemoglobin 12.0 - 15.0 g/dL 10.5(L) 10.2(L) 10.8(L)  Hematocrit 36.0 - 46.0 % 32.0(L) 30.8(L) 32.4(L)  Platelets 150 - 400 K/uL 191 218 216   . CMP Latest Ref Rng & Units 12/20/2018 08/08/2018 06/19/2018  Glucose 70 - 99 mg/dL 89 83 82  BUN 8 - 23 mg/dL 23 19 21   Creatinine 0.44 - 1.00 mg/dL 1.08(H) 0.93 0.91  Sodium 135 - 145  mmol/L 141 143 140  Potassium 3.5 - 5.1 mmol/L 4.3 4.2 4.4  Chloride 98 - 111 mmol/L 103 105 105  CO2 22 - 32 mmol/L 31 30 28   Calcium 8.9 - 10.3 mg/dL 9.7 9.3 9.9  Total Protein 6.5 - 8.1 g/dL 6.8 6.5 7.0  Total Bilirubin 0.3 - 1.2 mg/dL 0.4 0.8 0.5  Alkaline Phos 38 - 126 U/L 62 51 67  AST 15 - 41 U/L 12(L) 17 13(L)  ALT 0 - 44 U/L 7 16 8     . Lab Results  Component Value Date   IRON 52 12/20/2018   TIBC 265 12/20/2018   IRONPCTSAT 20 (L) 12/20/2018   (Iron and TIBC)  Lab Results  Component Value Date   FERRITIN 114 12/20/2018   RADIOGRAPHIC STUDIES: I have personally reviewed the radiological images as listed and agreed with the findings in the report. No results found.  ASSESSMENT & PLAN:   [name] is a 85 y.o. female with  1. Anemia - primarily from Iron deficiency hgb has significantly improved to 11.5 from a low of 7.2 (due to diverticular bleeding). Iron deficiency correcting with po iron replacement. ferritin is upto 114 . Lab Results  Component Value Date   IRON 52 12/20/2018   TIBC 265 12/20/2018   IRONPCTSAT 20 (L) 12/20/2018   (Iron and TIBC)  Lab Results  Component Value Date   FERRITIN 114 12/20/2018   PLAN: -Discussed pt labwork today, 12/20/18; HGB improved to 10.5 -12/20/18 Ferritin is pending -12/20/18 MMP is shows no M spike -Discussed that as the patient's previous M spike resolved, her HGB has improved after taking PO Iron replacement, I would give her the option of continuing to follow these concerns with her PCP, which she would like to do - no indication of Myeloma. Rpt SPEP with no M spike.IFE neg.  -no evidence of hemolysis. -No indication for blood transfusion or IV Iron at this time -Will see the pt back as needed  2. IgG Kappa M spike of 0.2g/dl- likely MGUS. PLAN: MMP 06/19/18 is no M spike. Normal IFE Rpt MMG today - no M spike Plan -no further f/u indicated  RTC with Dr Irene Limbo as needed.   All questions were answered. The patient knows to call the clinic with any problems, questions or concerns.  The total time spent in the appt was 20 minutes and more than 50% was on counseling and direct patient cares.   Sullivan Lone MD [place] AAHIVMS [facility] Hematology/Oncology Physician [facility]  (Office):       [phone] (Work cell):   [phone] (Fax):           [phone]  I, [place], am acting as a scribe for Dr. Sullivan Lone.   .I have reviewed the above documentation for accuracy and completeness, and I agree with the above. Brunetta Genera MD

## 2018-12-20 ENCOUNTER — Inpatient Hospital Stay: Payer: Medicare Other

## 2018-12-20 ENCOUNTER — Inpatient Hospital Stay: Payer: Medicare Other | Attending: Hematology | Admitting: Hematology

## 2018-12-20 VITALS — BP 158/69 | HR 64 | Temp 97.5°F | Resp 18 | Ht 65.0 in | Wt 173.7 lb

## 2018-12-20 DIAGNOSIS — Z8042 Family history of malignant neoplasm of prostate: Secondary | ICD-10-CM | POA: Insufficient documentation

## 2018-12-20 DIAGNOSIS — Z79899 Other long term (current) drug therapy: Secondary | ICD-10-CM

## 2018-12-20 DIAGNOSIS — Z8542 Personal history of malignant neoplasm of other parts of uterus: Secondary | ICD-10-CM | POA: Insufficient documentation

## 2018-12-20 DIAGNOSIS — Z803 Family history of malignant neoplasm of breast: Secondary | ICD-10-CM | POA: Diagnosis not present

## 2018-12-20 DIAGNOSIS — Z923 Personal history of irradiation: Secondary | ICD-10-CM | POA: Diagnosis not present

## 2018-12-20 DIAGNOSIS — Z9012 Acquired absence of left breast and nipple: Secondary | ICD-10-CM | POA: Diagnosis not present

## 2018-12-20 DIAGNOSIS — Z90710 Acquired absence of both cervix and uterus: Secondary | ICD-10-CM | POA: Diagnosis not present

## 2018-12-20 DIAGNOSIS — Z8249 Family history of ischemic heart disease and other diseases of the circulatory system: Secondary | ICD-10-CM | POA: Insufficient documentation

## 2018-12-20 DIAGNOSIS — Z853 Personal history of malignant neoplasm of breast: Secondary | ICD-10-CM | POA: Insufficient documentation

## 2018-12-20 DIAGNOSIS — Z808 Family history of malignant neoplasm of other organs or systems: Secondary | ICD-10-CM | POA: Insufficient documentation

## 2018-12-20 DIAGNOSIS — D509 Iron deficiency anemia, unspecified: Secondary | ICD-10-CM

## 2018-12-20 DIAGNOSIS — Z86718 Personal history of other venous thrombosis and embolism: Secondary | ICD-10-CM | POA: Insufficient documentation

## 2018-12-20 DIAGNOSIS — D472 Monoclonal gammopathy: Secondary | ICD-10-CM

## 2018-12-20 DIAGNOSIS — Z800 Family history of malignant neoplasm of digestive organs: Secondary | ICD-10-CM | POA: Insufficient documentation

## 2018-12-20 DIAGNOSIS — D649 Anemia, unspecified: Secondary | ICD-10-CM

## 2018-12-20 DIAGNOSIS — I10 Essential (primary) hypertension: Secondary | ICD-10-CM | POA: Insufficient documentation

## 2018-12-20 LAB — CMP (CANCER CENTER ONLY)
ALK PHOS: 62 U/L (ref 38–126)
ALT: 7 U/L (ref 0–44)
AST: 12 U/L — ABNORMAL LOW (ref 15–41)
Albumin: 3.5 g/dL (ref 3.5–5.0)
Anion gap: 7 (ref 5–15)
BILIRUBIN TOTAL: 0.4 mg/dL (ref 0.3–1.2)
BUN: 23 mg/dL (ref 8–23)
CALCIUM: 9.7 mg/dL (ref 8.9–10.3)
CHLORIDE: 103 mmol/L (ref 98–111)
CO2: 31 mmol/L (ref 22–32)
CREATININE: 1.08 mg/dL — AB (ref 0.44–1.00)
GFR, EST AFRICAN AMERICAN: 52 mL/min — AB (ref >60)
GFR, EST NON AFRICAN AMERICAN: 45 mL/min — AB (ref >60)
Glucose, Bld: 89 mg/dL (ref 70–99)
Potassium: 4.3 mmol/L (ref 3.5–5.1)
Sodium: 141 mmol/L (ref 135–145)
TOTAL PROTEIN: 6.8 g/dL (ref 6.5–8.1)

## 2018-12-20 LAB — CBC WITH DIFFERENTIAL/PLATELET
Abs Immature Granulocytes: 0.01 10*3/uL (ref 0.00–0.07)
BASOS ABS: 0 10*3/uL (ref 0.0–0.1)
BASOS PCT: 0 %
EOS PCT: 1 %
Eosinophils Absolute: 0.1 10*3/uL (ref 0.0–0.5)
HCT: 32 % — ABNORMAL LOW (ref 36.0–46.0)
Hemoglobin: 10.5 g/dL — ABNORMAL LOW (ref 12.0–15.0)
Immature Granulocytes: 0 %
Lymphocytes Relative: 21 %
Lymphs Abs: 1.1 10*3/uL (ref 0.7–4.0)
MCH: 28.6 pg (ref 26.0–34.0)
MCHC: 32.8 g/dL (ref 30.0–36.0)
MCV: 87.2 fL (ref 80.0–100.0)
Monocytes Absolute: 0.4 10*3/uL (ref 0.1–1.0)
Monocytes Relative: 7 %
NRBC: 0 % (ref 0.0–0.2)
Neutro Abs: 3.6 10*3/uL (ref 1.7–7.7)
Neutrophils Relative %: 71 %
Platelets: 191 10*3/uL (ref 150–400)
RBC: 3.67 MIL/uL — AB (ref 3.87–5.11)
RDW: 15.8 % — AB (ref 11.5–15.5)
WBC: 5.2 10*3/uL (ref 4.0–10.5)

## 2018-12-20 LAB — IRON AND TIBC
Iron: 52 ug/dL (ref 41–142)
Saturation Ratios: 20 % — ABNORMAL LOW (ref 21–57)
TIBC: 265 ug/dL (ref 236–444)
UIBC: 213 ug/dL (ref 120–384)

## 2018-12-20 LAB — FERRITIN: FERRITIN: 114 ng/mL (ref 11–307)

## 2018-12-22 ENCOUNTER — Telehealth: Payer: Self-pay

## 2018-12-22 LAB — MULTIPLE MYELOMA PANEL, SERUM
ALBUMIN/GLOB SERPL: 1.3 (ref 0.7–1.7)
Albumin SerPl Elph-Mcnc: 3.5 g/dL (ref 2.9–4.4)
Alpha 1: 0.2 g/dL (ref 0.0–0.4)
Alpha2 Glob SerPl Elph-Mcnc: 0.7 g/dL (ref 0.4–1.0)
B-Globulin SerPl Elph-Mcnc: 1 g/dL (ref 0.7–1.3)
Gamma Glob SerPl Elph-Mcnc: 0.8 g/dL (ref 0.4–1.8)
Globulin, Total: 2.8 g/dL (ref 2.2–3.9)
IGA: 318 mg/dL (ref 64–422)
IGM (IMMUNOGLOBULIN M), SRM: 70 mg/dL (ref 26–217)
IgG (Immunoglobin G), Serum: 932 mg/dL (ref 700–1600)
TOTAL PROTEIN ELP: 6.3 g/dL (ref 6.0–8.5)

## 2018-12-22 NOTE — Telephone Encounter (Signed)
RTC with Dr Irene Limbo as needed. Per 1/29 los

## 2018-12-29 DIAGNOSIS — I5042 Chronic combined systolic (congestive) and diastolic (congestive) heart failure: Secondary | ICD-10-CM | POA: Insufficient documentation

## 2018-12-29 NOTE — Progress Notes (Signed)
Subjective:   _0  ID@: [name], female    DOB: 1925-04-26, 85 y.o.   MRN: [id number]  Rankins, Bill Salinas, MD:  No chief complaint on file.   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  Patient underwent echocardiogram on 10/1/219 revealing moderate LVH, LVEF of 60%, grade 1 diastolic dysfunction and elevated PA pressures. In view of her age, conservative measures were preferred. She has had significant improvement in symptoms since being on Aldactone and making lifestyle changes. She is now on low dose Entresto and tolerating this well.   She has followed up with Dr. Irene Limbo and as her anemia has remained stable she was released from him. She has also seen her PCP and did not have hematuria noted on repeat urinanalysis.  She has been monitoring her BP at home that systolic has ranged from 045 to 134. Weight has been stable at around 175. Continues to have improvement in symptoms of dyspnea on exertion and her energy levels. Daughter is present at bedside, reports that patient is currently not on Aldactone or Lasix.     Past Medical History:  Diagnosis Date  . Anxiety   . Breast cancer ([place])    left 1994, right 2007  . Diverticulosis   . Diverticulosis of colon with hemorrhage 06/19/2014  . DVT (deep venous thrombosis) ([place]) 1992   right leg x 2  . Endometrial adenocarcinoma ([place]) 12/25/12   biopsy  . Family history of malignant neoplasm of gastrointestinal tract   . GERD (gastroesophageal reflux disease)   . History of blood transfusion   . Hx of radiation therapy 02/2006 - 03/2006   right breast  . Hx  of radiation therapy 5/12, 5/19, 5/29, 6/2, 04/30/2013   proximal vagina, 30 Gy in 5 sessions, HDR  . Hypertension   . Iron deficiency   . Peripheral vascular disease (HCC)    leg wounds  . Personal history of radiation therapy   . Ulcers of both lower extremities (HCC)    chronic  . Venous stasis    bilateral    Past Surgical History:  Procedure Laterality Date  . ABDOMINAL HYSTERECTOMY  01/18/13   robotic, bso  . BREAST LUMPECTOMY Right   . COLONOSCOPY WITH PROPOFOL N/A 09/16/2017   Procedure: COLONOSCOPY WITH PROPOFOL;  Surgeon: Wilford Corner, MD;  Location: [facility];  Service: Endoscopy;  Laterality: N/A;  Needs STAT CBC drawn before procedure  . MASTECTOMY Left 1994   left , Tamoxifen x 17 yrs  . partial mastectomy Right 2007   radiation    Family History  Problem Relation Age of Onset  . Cancer Father        skin  . Colon cancer Brother   . Cancer Brother        prostate  . Heart disease Brother   . Breast cancer Daughter   . Colon cancer Sister   . Heart disease Sister        MI  . Breast cancer Sister   . Anuerysm Sister     Social History   Socioeconomic History  . Marital status: Widowed    Spouse name: Not on  file  . Number of children: 6  . Years of education: Not on file  . Highest education level: Not on file  Occupational History  . Occupation: Retired   Scientific laboratory technician  . Financial resource strain: Not on file  . Food insecurity:    Worry: Not on file    Inability: Not on file  . Transportation needs:    Medical: Not on file    Non-medical: Not on file  Tobacco Use  . Smoking status: Never Smoker  . Smokeless tobacco: Never Used  Substance and Sexual Activity  . Alcohol use: No  . Drug use: No  . Sexual activity: Never  Lifestyle  . Physical activity:    Days per week: Not on file    Minutes per session: Not on file  . Stress: Not on file  Relationships  . Social connections:    Talks on phone: Not on file    Gets together:  Not on file    Attends religious service: Not on file    Active member of club or organization: Not on file    Attends meetings of clubs or organizations: Not on file    Relationship status: Not on file  . Intimate partner violence:    Fear of current or ex partner: Not on file    Emotionally abused: Not on file    Physically abused: Not on file    Forced sexual activity: Not on file  Other Topics Concern  . Not on file  Social History Narrative   2 caffeine drinks daily     Current Outpatient Medications on File Prior to Visit  Medication Sig Dispense Refill  . amiloride-hydrochlorothiazide (MODURETIC) 5-50 MG tablet Take 0.5 tablets by mouth daily.     . Cholecalciferol (VITAMIN D3) 1000 UNITS CAPS Take 1,000 Units by mouth daily.     . Cyanocobalamin (VITAMIN B 12 PO) Take 1,000 mcg by mouth daily.     [name] docusate sodium (COLACE) 100 MG capsule Take 100 mg by mouth daily as needed for mild constipation.    . ferrous sulfate 325 (65 FE) MG tablet Take 1 tablet (325 mg total) by mouth 2 (two) times daily with a meal. 60 tablet 5  . FLUoxetine (PROZAC) 10 MG capsule Take 10 mg by mouth daily.   0  . furosemide (LASIX) 20 MG tablet Take 1 tablet (20 mg total) by mouth daily. 14 tablet 0  . Magnesium 250 MG TABS Take 1 tablet by mouth daily.    . Multiple Vitamins-Minerals (CENTRUM SILVER PO) Take 1 tablet by mouth daily.      . Omega-3 Fatty Acids (FISH OIL) 1000 MG CAPS Take 1 capsule by mouth daily.      . pantoprazole (PROTONIX) 40 MG tablet Take 1 tablet (40 mg total) by mouth daily. 30 tablet 0  . traZODone (DESYREL) 50 MG tablet Take 25 mg by mouth at bedtime as needed for sleep. TAKES 1/2 TABLET     . vitamin C (ASCORBIC ACID) 500 MG tablet Take 500 mg by mouth daily.     No current facility-administered medications on file prior to visit.      Review of Systems  Constitution: Negative for decreased appetite, malaise/fatigue, weight gain and weight loss.  Eyes: Negative for  visual disturbance.  Cardiovascular: Positive for leg swelling (improved with support stockings). Negative for chest pain, dyspnea on exertion, orthopnea and syncope.  Respiratory: Negative for hemoptysis and wheezing.   Endocrine: Negative for cold  intolerance and heat intolerance.  Hematologic/Lymphatic: Negative for bleeding problem. Does not bruise/bleed easily.  Skin: Negative for nail changes.  Musculoskeletal: Negative for myalgias.  Gastrointestinal: Negative for abdominal pain, nausea and vomiting.  Genitourinary: Negative for hematuria.  Neurological: Negative for difficulty with concentration, dizziness, focal weakness and headaches.  Psychiatric/Behavioral: Negative for altered mental status and suicidal ideas.  All other systems reviewed and are negative.      Objective:     Echocardiogram 08/22/2018: Left ventricle cavity is normal in size. Moderate concentric hypertrophy of the left ventricle. Mild decrease in global wall motion. Doppler evidence of grade I (impaired) diastolic dysfunction, normal LAP. Calculated EF 44%.  Mild mitral valve leaflet thickening. Trace mitral regurgitation.  Moderate tricuspid regurgitation. Estimated pulmonary artery systolic pressure 40 mmHg.  Labs 11/20/2018: Creatinine 0.92, eGFR 55/63, potassium 4.5, BMP normal. 09/19/2018: Creatinine 1.0, EGFR 54, potassium 4.4, BMP otherwise normal. Urinalysis, cloudy, WBC 2+, 1+ occult blood, UA otherwise normal. Microscopic examination of UA, WBC 11-30, few bacteria, otherwise normal.  Physical Exam  Constitutional: She is oriented to person, place, and time. Vital signs are normal. She appears well-developed and well-nourished.  HENT:  Head: Normocephalic and atraumatic.  Neck: Normal range of motion.  Cardiovascular: Normal rate, regular rhythm, normal heart sounds and intact distal pulses.  Pulmonary/Chest: Effort normal and breath sounds normal. No accessory muscle usage. No respiratory  distress.  Abdominal: Soft. Bowel sounds are normal.  Musculoskeletal: Normal range of motion.        General: Edema present.  Neurological: She is alert and oriented to person, place, and time.  Skin: Skin is warm, dry and intact.  Vitals reviewed.          Assessment & Recommendations:   1. Chronic combined systolic and diastolic CHF (congestive heart failure) ([place]) Patient is doing well without any clinical evidence of decompensated heart failure. She is on low dose Entresto and is tolerating this well. I have not further increased in view of her soft blood pressure and also previous AKI with high dose Aldactone. I suspect that patient ran out of Lasix and also aldactone and that is why not present today. I will start her back on Aldactone low dose as she previously tolerated this well and potassium level stayed stable. Will use Lasix as needed for weight gain, shortness of breath, or leg edema. I have recommended repeating echocardiogram in 6 months for follow up on LVEF since being on Entresto. Would continue to recommend conservative measures in view of her age.   - furosemide (LASIX) 20 MG tablet; Take 1 tablet (20 mg total) by mouth daily as needed for up to 30 days.  Dispense: 30 tablet; Refill: 0 - spironolactone (ALDACTONE) 25 MG tablet; Take 1 tablet (25 mg total) by mouth daily.  Dispense: 90 tablet; Refill: 3 - Basic Metabolic Panel (BMET); Future - PCV ECHOCARDIOGRAM; Future  2. Essential hypertension Blood pressure is stable with current medical therapy.  3. Bilateral leg edema Has improved with compliance with support stockings and diet/lifestyle changes.  I have recommended continuing the same.   4. History of deep venous thrombosis (DVT) of distal vein of right lower extremity Previously in 1994 and 2007 without any reoccurrence since.   I will see her back in 6 months after echocardiogram for follow up. Encouraged them to contact me for any new or worsening  problems and will be happy to see her sooner if needed.     Jeri Lager, FNP-C [facility]  Office: [phone] Fax: [phone]

## 2019-01-01 ENCOUNTER — Ambulatory Visit: Payer: Medicare Other | Admitting: Cardiology

## 2019-01-01 ENCOUNTER — Encounter: Payer: Self-pay | Admitting: Cardiology

## 2019-01-01 VITALS — BP 115/56 | HR 61 | Ht 66.0 in | Wt 174.7 lb

## 2019-01-01 DIAGNOSIS — I5042 Chronic combined systolic (congestive) and diastolic (congestive) heart failure: Secondary | ICD-10-CM

## 2019-01-01 DIAGNOSIS — Z86718 Personal history of other venous thrombosis and embolism: Secondary | ICD-10-CM

## 2019-01-01 DIAGNOSIS — I10 Essential (primary) hypertension: Secondary | ICD-10-CM | POA: Diagnosis not present

## 2019-01-01 DIAGNOSIS — R600 Localized edema: Secondary | ICD-10-CM | POA: Diagnosis not present

## 2019-01-01 MED ORDER — FUROSEMIDE 20 MG PO TABS: 20.0000 mg | ORAL_TABLET | Freq: Every day | ORAL | 0 refills | Status: DC | PRN

## 2019-01-01 MED ORDER — SPIRONOLACTONE 25 MG PO TABS: 25.0000 mg | ORAL_TABLET | Freq: Every day | ORAL | 3 refills | Status: DC

## 2019-01-15 ENCOUNTER — Other Ambulatory Visit: Payer: Self-pay

## 2019-01-16 LAB — BASIC METABOLIC PANEL
BUN / CREAT RATIO: 18 (ref 12–28)
BUN: 19 mg/dL (ref 10–36)
CO2: 19 mmol/L — ABNORMAL LOW (ref 20–29)
CREATININE: 1.06 mg/dL — AB (ref 0.57–1.00)
Calcium: 9.8 mg/dL (ref 8.7–10.3)
Chloride: 103 mmol/L (ref 96–106)
GFR calc non Af Amer: 46 mL/min/{1.73_m2} — ABNORMAL LOW (ref >59)
GFR, EST AFRICAN AMERICAN: 53 mL/min/{1.73_m2} — AB (ref >59)
Glucose: 84 mg/dL (ref 65–99)
Potassium: 5 mmol/L (ref 3.5–5.2)
Sodium: 143 mmol/L (ref 134–144)

## 2019-02-11 ENCOUNTER — Other Ambulatory Visit: Payer: Self-pay | Admitting: Cardiology

## 2019-02-14 ENCOUNTER — Other Ambulatory Visit: Payer: Self-pay

## 2019-02-14 DIAGNOSIS — I5042 Chronic combined systolic (congestive) and diastolic (congestive) heart failure: Secondary | ICD-10-CM

## 2019-02-14 MED ORDER — FUROSEMIDE 20 MG PO TABS: 20.0000 mg | ORAL_TABLET | Freq: Every day | ORAL | 1 refills | Status: DC | PRN

## 2019-02-14 MED ORDER — SPIRONOLACTONE 25 MG PO TABS: 25.0000 mg | ORAL_TABLET | Freq: Every day | ORAL | 3 refills | Status: DC

## 2019-02-14 MED ORDER — SACUBITRIL-VALSARTAN 24-26 MG PO TABS: 1.0000 | ORAL_TABLET | Freq: Two times a day (BID) | ORAL | 3 refills | Status: DC

## 2019-03-12 ENCOUNTER — Other Ambulatory Visit: Payer: Self-pay | Admitting: Family Medicine

## 2019-03-12 DIAGNOSIS — R451 Restlessness and agitation: Secondary | ICD-10-CM

## 2019-03-12 DIAGNOSIS — R410 Disorientation, unspecified: Secondary | ICD-10-CM

## 2019-03-14 ENCOUNTER — Other Ambulatory Visit: Payer: Self-pay

## 2019-03-14 ENCOUNTER — Ambulatory Visit
Admission: RE | Admit: 2019-03-14 | Discharge: 2019-03-14 | Disposition: A | Discharge status: Home / Self Care | Payer: Medicare Other | Source: Ambulatory Visit | Attending: Family Medicine | Admitting: Family Medicine

## 2019-03-14 DIAGNOSIS — R451 Restlessness and agitation: Secondary | ICD-10-CM

## 2019-03-14 DIAGNOSIS — R410 Disorientation, unspecified: Secondary | ICD-10-CM

## 2019-05-02 ENCOUNTER — Other Ambulatory Visit: Payer: Self-pay | Admitting: Family Medicine

## 2019-05-02 DIAGNOSIS — Z1231 Encounter for screening mammogram for malignant neoplasm of breast: Secondary | ICD-10-CM

## 2019-05-15 ENCOUNTER — Ambulatory Visit: Payer: Medicare Other | Admitting: Neurology

## 2019-06-05 ENCOUNTER — Encounter: Payer: Self-pay | Admitting: Neurology

## 2019-06-05 ENCOUNTER — Other Ambulatory Visit: Payer: Self-pay

## 2019-06-05 ENCOUNTER — Ambulatory Visit (INDEPENDENT_AMBULATORY_CARE_PROVIDER_SITE_OTHER): Payer: Medicare Other | Admitting: Neurology

## 2019-06-05 ENCOUNTER — Telehealth: Payer: Self-pay | Admitting: Neurology

## 2019-06-05 VITALS — BP 142/73 | HR 65 | Temp 96.9°F | Ht 66.0 in | Wt 177.5 lb

## 2019-06-05 DIAGNOSIS — G309 Alzheimer's disease, unspecified: Secondary | ICD-10-CM | POA: Diagnosis not present

## 2019-06-05 DIAGNOSIS — F0391 Unspecified dementia with behavioral disturbance: Secondary | ICD-10-CM | POA: Diagnosis not present

## 2019-06-05 DIAGNOSIS — R269 Unspecified abnormalities of gait and mobility: Secondary | ICD-10-CM

## 2019-06-05 DIAGNOSIS — F0390 Unspecified dementia without behavioral disturbance: Secondary | ICD-10-CM | POA: Insufficient documentation

## 2019-06-05 MED ORDER — QUETIAPINE FUMARATE 25 MG PO TABS: 50.0000 mg | ORAL_TABLET | Freq: Every day | ORAL | 11 refills | Status: DC

## 2019-06-05 NOTE — Telephone Encounter (Signed)
[employer] medicare order sent to GI. No auth they will reach out to the patient to schedule.  

## 2019-06-05 NOTE — Patient Instructions (Signed)
Start Seroquel 25mg  qhs, if needed, may add on 2nd tablets

## 2019-06-05 NOTE — Progress Notes (Signed)
PATIENT: [name] DOB: 1925-07-17  Chief Complaint  Patient presents with  . Cognitive Changes    MMSE 15/30 - 3 animals.  She is here with her granddaughter, [name]. She lives at home with her daughter, [name].  Reports worsening memory and agitation.  [name] PCP    Marda Stalker, PA-C     HISTORICAL  [name] is a 85 year old female, seen in request by her primary care PA Marda Stalker for evaluation of dementia, initial evaluation was on June 05, 2019.  She is accompanied by her granddaughter [name] at today's visit.  I have reviewed and summarized the referring note from the referring physician.  She had past medical history of breast cancer, left side was treated in 1994, right side was in 2007, she also had a history of DVT, endometrial adenocarcinoma, peripheral vascular disease, she lives at home with her daughter, the care of her 1.05 is [name].  She began to have gradual onset memory loss since 2019, getting worse rapidly, she is independent in daily activity, no longer driving, but still make simple meals, dressing, toileting, bathing without help, she has decreased appetite, sometimes has difficulty sleeping  She has gradual worsening visual hallucinations, she see people at her home, talking, irritated sometimes scared her much, she got up in the middle of the night, she has to use the cane to knock on the wall to stop them talking.  This happened mostly at the evening time, sometimes at daytime as well,  She ambulate with a cane due to low back, joint pain,  Laboratory evaluations in April 2020, hematocrit 34, creatinine 1.05, normal B12, TSH 3.57, Personally reviewed CT head in April 2020: Generalized atrophy, small vessel disease, no acute abnormalities.  REVIEW OF SYSTEMS: Full 14 system review of systems performed and notable only for as above All other review of systems were negative.  ALLERGIES: No Known Allergies  HOME  MEDICATIONS: Current Outpatient Medications  Medication Sig Dispense Refill  . Cholecalciferol (VITAMIN D3) 1000 UNITS CAPS Take 1,000 Units by mouth daily.     . citalopram (CELEXA) 20 MG tablet TK 1 T PO QD    . ferrous sulfate 325 (65 FE) MG tablet Take 1 tablet (325 mg total) by mouth 2 (two) times daily with a meal. (Patient taking differently: Take 325 mg by mouth daily. ) 60 tablet 5  . furosemide (LASIX) 20 MG tablet Take 1 tablet (20 mg total) by mouth daily as needed. 90 tablet 1  . Magnesium 250 MG TABS Take 1 tablet by mouth daily.    . Multiple Vitamins-Minerals (CENTRUM SILVER PO) Take 1 tablet by mouth daily.      . Omega-3 Fatty Acids (FISH OIL) 1000 MG CAPS Take 1,200 mg by mouth daily.     . sacubitril-valsartan (ENTRESTO) 24-26 MG Take 1 tablet by mouth 2 (two) times daily. 180 tablet 3  . spironolactone (ALDACTONE) 25 MG tablet Take 1 tablet (25 mg total) by mouth daily. 90 tablet 3   No current facility-administered medications for this visit.     PAST MEDICAL HISTORY: Past Medical History:  Diagnosis Date  . Anxiety   . Breast cancer ([place])    left 1994, right 2007  . Diverticulosis   . Diverticulosis of colon with hemorrhage 06/19/2014  . DVT (deep venous thrombosis) ([place]) 1992   right leg x 2  . Endometrial adenocarcinoma ([name]) 12/25/12   biopsy  . Family history of malignant neoplasm of gastrointestinal tract   .  GERD (gastroesophageal reflux disease)   . History of blood transfusion   . Hx of radiation therapy 02/2006 - 03/2006   right breast  . Hx of radiation therapy 5/12, 5/19, 5/29, 6/2, 04/30/2013   proximal vagina, 30 Gy in 5 sessions, HDR  . Hypertension   . Iron deficiency   . Memory loss   . Peripheral vascular disease (HCC)    leg wounds  . Personal history of radiation therapy   . Ulcers of both lower extremities (HCC)    chronic  . Venous stasis    bilateral    PAST SURGICAL HISTORY: Past Surgical History:  Procedure Laterality Date  .  ABDOMINAL HYSTERECTOMY  01/18/13   robotic, bso  . BREAST LUMPECTOMY Right   . COLONOSCOPY WITH PROPOFOL N/A 09/16/2017   Procedure: COLONOSCOPY WITH PROPOFOL;  Surgeon: Wilford Corner, MD;  Location: [facility];  Service: Endoscopy;  Laterality: N/A;  Needs STAT CBC drawn before procedure  . MASTECTOMY Left 1994   left , Tamoxifen x 17 yrs  . partial mastectomy Right 2007   radiation    FAMILY HISTORY: Family History  Problem Relation Age of Onset  . Cancer Father        skin  . Other Mother        died during childbirth  . Cancer Brother        prostate  . Prostate cancer Brother   . Breast cancer Daughter   . Colon cancer Sister   . Heart disease Sister        MI  . Breast cancer Sister   . Anuerysm Sister     SOCIAL HISTORY: Social History   Socioeconomic History  . Marital status: Widowed    Spouse name: Not on file  . Number of children: 6  . Years of education: 2nd or 3rd grade  . Highest education level: Not on file  Occupational History  . Occupation: Retired   Scientific laboratory technician  . Financial resource strain: Not on file  . Food insecurity    Worry: Not on file    Inability: Not on file  . Transportation needs    Medical: Not on file    Non-medical: Not on file  Tobacco Use  . Smoking status: Never Smoker  . Smokeless tobacco: Never Used  Substance and Sexual Activity  . Alcohol use: No  . Drug use: No  . Sexual activity: Never  Lifestyle  . Physical activity    Days per week: Not on file    Minutes per session: Not on file  . Stress: Not on file  Relationships  . Social Herbalist on phone: Not on file    Gets together: Not on file    Attends religious service: Not on file    Active member of club or organization: Not on file    Attends meetings of clubs or organizations: Not on file    Relationship status: Not on file  . Intimate partner violence    Fear of current or ex partner: Not on file    Emotionally abused: Not on file     Physically abused: Not on file    Forced sexual activity: Not on file  Other Topics Concern  . Not on file  Social History Narrative   No caffeine use.   Lives at home with her daughter.   Right-handed.     PHYSICAL EXAM   Vitals:   06/05/19 0830  BP: (!) 142/73  Pulse: 65  Temp: (!) 96.9 F (36.1 C)  Weight: 177 lb 8 oz (80.5 kg)  Height: 5\' 6"  (1.676 m)    Not recorded      Body mass index is 28.65 kg/m.  PHYSICAL EXAMNIATION:  Gen: NAD, conversant, well nourised, obese, well groomed                     Cardiovascular: Regular rate rhythm, no peripheral edema, warm, nontender. Eyes: Conjunctivae clear without exudates or hemorrhage Neck: Supple, no carotid bruits. Pulmonary: Clear to auscultation bilaterally   NEUROLOGICAL EXAM:  MMSE - Mini Mental State Exam 06/05/2019  Orientation to time 2  Orientation to Place 3  Registration 3  Attention/ Calculation 0  Recall 0  Language- name 2 objects 2  Language- repeat 1  Language- follow 3 step command 3  Language- read & follow direction 1  Write a sentence 0  Copy design 0  Total score 15  animal naming 3   CRANIAL NERVES: CN II: Visual fields are full to confrontation.  Pupils are round equal and briskly reactive to light. CN III, IV, VI: extraocular movement are normal. No ptosis. CN V: Facial sensation is intact to pinprick in all 3 divisions bilaterally. Corneal responses are intact.  CN VII: Face is symmetric with normal eye closure and smile. CN VIII: Hearing is normal to rubbing fingers CN IX, X: Palate elevates symmetrically. Phonation is normal. CN XI: Head turning and shoulder shrug are intact CN XII: Tongue is midline with normal movements and no atrophy.  MOTOR: There is no pronator drift of out-stretched arms. Muscle bulk and tone are normal. Muscle strength is normal.  REFLEXES: Reflexes are 1 and symmetric at the biceps, triceps, knees, and ankles. Plantar responses are flexor.   SENSORY: Intact to light touch, pinprick, positional sensation and vibratory sensation are intact in fingers and toes.  COORDINATION: Rapid alternating movements and fine finger movements are intact. There is no dysmetria on finger-to-nose and heel-knee-shin.    GAIT/STANCE: She needs push-up to get up from seated position, rely on her walker, antalgic, cautious, wide-based unsteady gait  DIAGNOSTIC DATA (LABS, IMAGING, TESTING) - I reviewed patient records, labs, notes, testing and imaging myself where available.   ASSESSMENT AND PLAN  Maegen Wigle is a 85 y.o. female   Dementia with visual hallucinations Gait abnormality  complete evaluation with MRI of the brain  Referral to Home health physical therapy  Start Seroquel 25 mg, titrating to 50 mg every night   Marcial Pacas, M.D. Ph.D.  [facility] [address], [place], [place] [address] Ph: [phone] Fax: [phone]  CC:Marda Stalker, PA-C

## 2019-06-20 ENCOUNTER — Other Ambulatory Visit: Payer: Self-pay

## 2019-06-20 ENCOUNTER — Ambulatory Visit
Admission: RE | Admit: 2019-06-20 | Discharge: 2019-06-20 | Disposition: A | Discharge status: Home / Self Care | Payer: Medicare Other | Source: Ambulatory Visit | Attending: Family Medicine | Admitting: Family Medicine

## 2019-06-20 DIAGNOSIS — Z1231 Encounter for screening mammogram for malignant neoplasm of breast: Secondary | ICD-10-CM

## 2019-07-02 ENCOUNTER — Other Ambulatory Visit: Payer: Self-pay

## 2019-07-02 ENCOUNTER — Ambulatory Visit (INDEPENDENT_AMBULATORY_CARE_PROVIDER_SITE_OTHER): Payer: Medicare Other

## 2019-07-02 DIAGNOSIS — I5042 Chronic combined systolic (congestive) and diastolic (congestive) heart failure: Secondary | ICD-10-CM

## 2019-07-09 ENCOUNTER — Ambulatory Visit (INDEPENDENT_AMBULATORY_CARE_PROVIDER_SITE_OTHER): Payer: Medicare Other | Admitting: Cardiology

## 2019-07-09 ENCOUNTER — Other Ambulatory Visit: Payer: Self-pay

## 2019-07-09 ENCOUNTER — Encounter: Payer: Self-pay | Admitting: Cardiology

## 2019-07-09 VITALS — BP 144/75 | HR 70 | Ht 66.0 in | Wt 181.0 lb

## 2019-07-09 DIAGNOSIS — I5032 Chronic diastolic (congestive) heart failure: Secondary | ICD-10-CM

## 2019-07-09 DIAGNOSIS — I5042 Chronic combined systolic (congestive) and diastolic (congestive) heart failure: Secondary | ICD-10-CM

## 2019-07-09 DIAGNOSIS — Z86718 Personal history of other venous thrombosis and embolism: Secondary | ICD-10-CM | POA: Diagnosis not present

## 2019-07-09 DIAGNOSIS — R600 Localized edema: Secondary | ICD-10-CM | POA: Diagnosis not present

## 2019-07-09 DIAGNOSIS — I10 Essential (primary) hypertension: Secondary | ICD-10-CM

## 2019-07-09 NOTE — Progress Notes (Signed)
Primary Physician:  Marda Stalker, PA-C   Patient ID: [name], female    DOB: March 17, 1925, 85 y.o.   MRN: [id number]  Subjective:    Chief Complaint  Patient presents with  . Congestive Heart Failure  . Follow-up    6 month  . Results    echo    HPI: [name]  is a 85 y.o. female  with  history of chronic veno-stasis ulceration due to DVT in 1992 and again in 2007, GERD, history of GI bleed due to diverticulosis in November of 2018, chronic anemia, hypertension, history of uterine cancer S/P radiation therapy in 2016, history of breast cancer S/P mastectomy and radiation therapy, systolic and diastolic CHF by echo in Oct 2019.  In view of her age, conservative measures were preferred. She has had significant improvement in symptoms since being on Aldactone, Entresto, and making lifestyle changes. Here for 6 month follow up and discuss echo results.  She is overall doing well. Denies any dyspnea. Leg edema has been stable. She has been monitoring her BP at home that systolic has ranged from 443 to 134. Weight has slightly increased over the last few months. She has now established with Dr. Krista Blue with Neurology due to hallucinations and decreased memory. She has had improvement in night time hallucinations with Seroquel, but does have day time hallucinations. She has been cooking more to help keep her mind busy, which helps. Daughter is present at bedside.   Past Medical History:  Diagnosis Date  . Anxiety   . Breast cancer ([place])    left 1994, right 2007  . Diverticulosis   . Diverticulosis of colon with hemorrhage 06/19/2014  . DVT (deep venous thrombosis) ([place]) 1992   right leg x 2  . Endometrial adenocarcinoma ([place]) 12/25/12   biopsy  . Family history of malignant neoplasm of gastrointestinal tract   . GERD (gastroesophageal reflux disease)   . History of blood transfusion   . Hx of radiation therapy 02/2006 - 03/2006   right breast  . Hx of radiation  therapy 5/12, 5/19, 5/29, 6/2, 04/30/2013   proximal vagina, 30 Gy in 5 sessions, HDR  . Hypertension   . Iron deficiency   . Memory loss   . Peripheral vascular disease (HCC)    leg wounds  . Personal history of radiation therapy   . Ulcers of both lower extremities (HCC)    chronic  . Venous stasis    bilateral    Past Surgical History:  Procedure Laterality Date  . ABDOMINAL HYSTERECTOMY  01/18/13   robotic, bso  . BREAST LUMPECTOMY Right   . COLONOSCOPY WITH PROPOFOL N/A 09/16/2017   Procedure: COLONOSCOPY WITH PROPOFOL;  Surgeon: Wilford Corner, MD;  Location: [facility];  Service: Endoscopy;  Laterality: N/A;  Needs STAT CBC drawn before procedure  . MASTECTOMY Left 1994   left , Tamoxifen x 17 yrs  . partial mastectomy Right 2007   radiation    Social History   Socioeconomic History  . Marital status: Widowed    Spouse name: Not on file  . Number of children: 6  . Years of education: 2nd or 3rd grade  . Highest education level: Not on file  Occupational History  . Occupation: Retired   Scientific laboratory technician  . Financial resource strain: Not on file  . Food insecurity    Worry: Not on file    Inability: Not on file  . Transportation needs    Medical: Not on file  Non-medical: Not on file  Tobacco Use  . Smoking status: Never Smoker  . Smokeless tobacco: Never Used  Substance and Sexual Activity  . Alcohol use: No  . Drug use: No  . Sexual activity: Never  Lifestyle  . Physical activity    Days per week: Not on file    Minutes per session: Not on file  . Stress: Not on file  Relationships  . Social Herbalist on phone: Not on file    Gets together: Not on file    Attends religious service: Not on file    Active member of club or organization: Not on file    Attends meetings of clubs or organizations: Not on file    Relationship status: Not on file  . Intimate partner violence    Fear of current or ex partner: Not on file    Emotionally  abused: Not on file    Physically abused: Not on file    Forced sexual activity: Not on file  Other Topics Concern  . Not on file  Social History Narrative   No caffeine use.   Lives at home with her daughter.   Right-handed.    Review of Systems  Constitution: Negative for decreased appetite, malaise/fatigue, weight gain and weight loss.  Eyes: Negative for visual disturbance.  Cardiovascular: Positive for leg swelling (improved with support stockings). Negative for chest pain, dyspnea on exertion, orthopnea and syncope.  Respiratory: Negative for hemoptysis and wheezing.   Endocrine: Negative for cold intolerance and heat intolerance.  Hematologic/Lymphatic: Negative for bleeding problem. Does not bruise/bleed easily.  Skin: Negative for nail changes.  Musculoskeletal: Negative for myalgias.  Gastrointestinal: Negative for abdominal pain, nausea and vomiting.  Genitourinary: Negative for hematuria.  Neurological: Negative for difficulty with concentration, dizziness, focal weakness and headaches.  Psychiatric/Behavioral: Negative for altered mental status and suicidal ideas.  All other systems reviewed and are negative.     Objective:  Blood pressure (!) 144/75, pulse 70, height 5\' 6"  (1.676 m), weight 181 lb (82.1 kg), SpO2 98 %. Body mass index is 29.21 kg/m.    Physical Exam  Constitutional: She is oriented to person, place, and time. Vital signs are normal. She appears well-developed and well-nourished.  HENT:  Head: Normocephalic and atraumatic.  Neck: Normal range of motion.  Cardiovascular: Normal rate, regular rhythm, normal heart sounds and intact distal pulses.  Pulmonary/Chest: Effort normal and breath sounds normal. No accessory muscle usage. No respiratory distress.  Abdominal: Soft. Bowel sounds are normal.  Musculoskeletal: Normal range of motion.        General: Edema present.  Neurological: She is alert and oriented to person, place, and time.  Skin: Skin  is warm, dry and intact.  Vitals reviewed.  Radiology: No results found.  Laboratory examination:    CMP Latest Ref Rng & Units 01/15/2019 12/20/2018 08/08/2018  Glucose 65 - 99 mg/dL 84 89 83  BUN 10 - 36 mg/dL 19 23 19   Creatinine 0.57 - 1.00 mg/dL 1.06(H) 1.08(H) 0.93  Sodium 134 - 144 mmol/L 143 141 143  Potassium 3.5 - 5.2 mmol/L 5.0 4.3 4.2  Chloride 96 - 106 mmol/L 103 103 105  CO2 20 - 29 mmol/L 19(L) 31 30  Calcium 8.7 - 10.3 mg/dL 9.8 9.7 9.3  Total Protein 6.5 - 8.1 g/dL - 6.8 6.5  Total Bilirubin 0.3 - 1.2 mg/dL - 0.4 0.8  Alkaline Phos 38 - 126 U/L - 62 51  AST 15 -  41 U/L - 12(L) 17  ALT 0 - 44 U/L - 7 16   CBC Latest Ref Rng & Units 12/20/2018 08/08/2018 06/19/2018  WBC 4.0 - 10.5 K/uL 5.2 5.1 6.3  Hemoglobin 12.0 - 15.0 g/dL 10.5(L) 10.2(L) 10.8(L)  Hematocrit 36.0 - 46.0 % 32.0(L) 30.8(L) 32.4(L)  Platelets 150 - 400 K/uL 191 218 216   Lipid Panel  No results found for: CHOL, TRIG, HDL, CHOLHDL, VLDL, LDLCALC, LDLDIRECT HEMOGLOBIN A1C No results found for: HGBA1C, MPG TSH No results for input(s): TSH in the last 8760 hours.  PRN Meds:. There are no discontinued medications. Current Meds  Medication Sig  . Cholecalciferol (VITAMIN D3) 1000 UNITS CAPS Take 1,000 Units by mouth daily.   . citalopram (CELEXA) 20 MG tablet TK 1 T PO QD  . ferrous sulfate 325 (65 FE) MG tablet Take 1 tablet (325 mg total) by mouth 2 (two) times daily with a meal. (Patient taking differently: Take 325 mg by mouth daily. )  . furosemide (LASIX) 20 MG tablet Take 1 tablet (20 mg total) by mouth daily as needed.  . Magnesium 250 MG TABS Take 1 tablet by mouth daily.  . Multiple Vitamins-Minerals (CENTRUM SILVER PO) Take 1 tablet by mouth daily.    . Omega-3 Fatty Acids (FISH OIL) 1000 MG CAPS Take 1,200 mg by mouth daily.   . QUEtiapine (SEROQUEL) 25 MG tablet Take 2 tablets (50 mg total) by mouth at bedtime.  . sacubitril-valsartan (ENTRESTO) 24-26 MG Take 1 tablet by mouth 2 (two)  times daily.  [name] spironolactone (ALDACTONE) 25 MG tablet Take 1 tablet (25 mg total) by mouth daily.    Cardiac Studies:   Echocardiogram 07/02/2019: Left ventricle cavity is normal in size. Normal left ventricular wall thickness. Normal LV systolic function with EF 74%. Normal global wall motion. Doppler evidence of grade I (impaired) diastolic dysfunction.  Mild biatrial dilatation. Trace aortic regurgitation. Mild calcification of the mitral valve annulus. Mild (Grade I) mitral regurgitation. Moderate tricuspid regurgitation. Moderate pulmonary hypertension. Estimated pulmonary artery systolic pressure is 44 mmHg.  Compared to previous study on 08/22/2018, LVEF is improved.   Echocardiogram 08/22/2018: Left ventricle cavity is normal in size. Moderate concentric hypertrophy of the left ventricle. Mild decrease in global wall motion. Doppler evidence of grade I (impaired) diastolic dysfunction, normal LAP. Calculated EF 44%.  Mild mitral valve leaflet thickening. Trace mitral regurgitation.  Moderate tricuspid regurgitation. Estimated pulmonary artery systolic pressure 40 mmHg.  Assessment:     ICD-10-CM   1. Chronic diastolic heart failure (HCC)  I50.32 EKG 12-Lead  2. Essential hypertension  I10   3. Bilateral leg edema  R60.0   4. History of deep venous thrombosis (DVT) of distal vein of right lower extremity  Z86.718     EKG 07/09/2019: Normal sinus rhythm at 72 bpm, normal axis, PRWP, cannot exclude anteroseptal infarct old. No evidence of ischemia.   Recommendations:   Patient is presently doing well without any significant complaints.  Daughter is present at bedside.  I will just recently obtained echocardiogram results with the patient, LVEF has improved compared to 1 year ago, previously 44% and now calculated at 74%.  I recommended continuing with present medical therapy.  She has gained some weight since last seen by me, feel that this is related to her diet.  She has  been cooking more to help with her hallucinations.  I have encouraged her to start walking some in her house to increase her activity level and also  help with deconditioning.  Discussed cutting back on portion sizes to help with weight gain.  Blood pressure was slightly elevated today, but at home has been very well controlled by home monitoring log that she has with her today. Leg edema has been stable with use of support stockings. She is to see her PCP next month for annual physical and will have labs performed at that time.  I will see her back in 1 year or sooner if needed.  Miquel Dunn, MSN, APRN, FNP-C [facility]. [place] Office: [phone] Fax: [phone]

## 2019-08-10 ENCOUNTER — Other Ambulatory Visit: Payer: Self-pay

## 2019-08-10 DIAGNOSIS — I5042 Chronic combined systolic (congestive) and diastolic (congestive) heart failure: Secondary | ICD-10-CM

## 2019-08-10 MED ORDER — FUROSEMIDE 20 MG PO TABS: 20.0000 mg | ORAL_TABLET | Freq: Every day | ORAL | 1 refills | Status: DC | PRN

## 2019-08-28 ENCOUNTER — Other Ambulatory Visit: Payer: Self-pay

## 2019-08-28 DIAGNOSIS — Z20822 Contact with and (suspected) exposure to covid-19: Secondary | ICD-10-CM

## 2019-08-30 LAB — NOVEL CORONAVIRUS, NAA: SARS-CoV-2, NAA: NOT DETECTED

## 2019-10-15 DIAGNOSIS — R600 Localized edema: Secondary | ICD-10-CM

## 2019-10-16 ENCOUNTER — Other Ambulatory Visit: Payer: Self-pay

## 2019-10-16 DIAGNOSIS — I5042 Chronic combined systolic (congestive) and diastolic (congestive) heart failure: Secondary | ICD-10-CM

## 2019-10-16 MED ORDER — SPIRONOLACTONE 25 MG PO TABS: 25.0000 mg | ORAL_TABLET | Freq: Every day | ORAL | 1 refills | Status: DC

## 2019-12-28 ENCOUNTER — Other Ambulatory Visit: Payer: Self-pay | Admitting: Cardiology

## 2019-12-28 DIAGNOSIS — I5042 Chronic combined systolic (congestive) and diastolic (congestive) heart failure: Secondary | ICD-10-CM

## 2020-02-11 ENCOUNTER — Other Ambulatory Visit: Payer: Self-pay | Admitting: Cardiology

## 2020-02-11 DIAGNOSIS — I5042 Chronic combined systolic (congestive) and diastolic (congestive) heart failure: Secondary | ICD-10-CM

## 2020-02-15 ENCOUNTER — Other Ambulatory Visit: Payer: Self-pay | Admitting: Cardiology

## 2020-02-15 DIAGNOSIS — I5042 Chronic combined systolic (congestive) and diastolic (congestive) heart failure: Secondary | ICD-10-CM

## 2020-03-03 ENCOUNTER — Other Ambulatory Visit: Payer: Self-pay | Admitting: Cardiology

## 2020-05-12 ENCOUNTER — Other Ambulatory Visit (HOSPITAL_COMMUNITY): Payer: Self-pay | Admitting: Family Medicine

## 2020-05-12 DIAGNOSIS — M79661 Pain in right lower leg: Secondary | ICD-10-CM

## 2020-05-13 ENCOUNTER — Other Ambulatory Visit: Payer: Self-pay | Admitting: Family Medicine

## 2020-05-13 ENCOUNTER — Other Ambulatory Visit: Payer: Self-pay

## 2020-05-13 ENCOUNTER — Ambulatory Visit (HOSPITAL_COMMUNITY)
Admission: RE | Admit: 2020-05-13 | Discharge: 2020-05-13 | Disposition: A | Discharge status: Home / Self Care | Payer: Medicare PPO | Source: Ambulatory Visit | Attending: Cardiovascular Disease | Admitting: Cardiovascular Disease

## 2020-05-13 DIAGNOSIS — Z1231 Encounter for screening mammogram for malignant neoplasm of breast: Secondary | ICD-10-CM

## 2020-05-13 DIAGNOSIS — M79661 Pain in right lower leg: Secondary | ICD-10-CM | POA: Diagnosis present

## 2020-05-13 DIAGNOSIS — M7989 Other specified soft tissue disorders: Secondary | ICD-10-CM | POA: Insufficient documentation

## 2020-05-23 ENCOUNTER — Ambulatory Visit: Payer: Medicare PPO | Admitting: Cardiology

## 2020-05-23 ENCOUNTER — Other Ambulatory Visit: Payer: Self-pay

## 2020-05-23 ENCOUNTER — Encounter: Payer: Self-pay | Admitting: Cardiology

## 2020-05-23 VITALS — BP 145/70 | HR 70 | Ht 66.0 in | Wt 181.0 lb

## 2020-05-23 DIAGNOSIS — I10 Essential (primary) hypertension: Secondary | ICD-10-CM

## 2020-05-23 DIAGNOSIS — I5032 Chronic diastolic (congestive) heart failure: Secondary | ICD-10-CM

## 2020-05-23 DIAGNOSIS — I872 Venous insufficiency (chronic) (peripheral): Secondary | ICD-10-CM

## 2020-05-23 DIAGNOSIS — Z86718 Personal history of other venous thrombosis and embolism: Secondary | ICD-10-CM

## 2020-05-23 NOTE — Progress Notes (Signed)
[name] Date of Birth: 1925-06-10 MRN: [id number] D'Lo, [name] Former Cardiology Providers: Jeri Lager, APRN, FNP-C Primary Cardiologist: Rex Kras, DO, [facility] (established care May 23, 2020)  Date: 05/23/20 Last Visit: August 2020  Chief Complaint  Patient presents with  . Congestive Heart Failure  . Hypertension  . Leg Swelling  . Follow-up    1 yr C/O rt arm pain    HPI  [name] is a 86 y.o.  female who presents to the office with a chief complaint of " heart failure management." Patient's past medical history and cardiovascular risk factors include: Chronic heart failure with preserved EF, stage B, NYHA class II, history of DVT, GERD, history of GI bleed due to diverticulosis, chronic anemia, hypertension, chronic venous stasis, history of uterine cancer status post radiation therapy in 2016, history of breast cancer status postmastectomy and radiation therapy.  Patient presents to the office accompanied by her daughter [name].  Patient was last seen in the office by Miquel Dunn for management of congestive heart failure.  I am seeing her for the first time to reestablish care.  Since last office visit patient has not had any exacerbation of congestive heart failure requiring hospitalizations or urgent care visits.  Patient is compliant with her medications on a regular basis.  She has not had a chance to take her morning medications today and therefore her blood pressure is elevated.  According to the patient's daughter her home blood pressures usually are averaging 130 mmHg over 70 mmHg.  She denies any heart failure symptoms and clinically is euvolemic.  Patient also has been experiencing right arm swelling and pain.  Going on for the last 1 week, the swelling intensity has remained the same, no bruising noted, no significant swelling noted.  History of  congestive heart failure, deep venous thrombosis. Denies prior history of  coronary artery disease, myocardial infarction, pulmonary embolism, stroke, transient ischemic attack.  FUNCTIONAL STATUS: Walks with cane and walker.    ALLERGIES: No Known Allergies   MEDICATION LIST PRIOR TO VISIT: Current Outpatient Medications on File Prior to Visit  Medication Sig Dispense Refill  . Cholecalciferol (VITAMIN D3) 1000 UNITS CAPS Take 1,000 Units by mouth daily.     . citalopram (CELEXA) 20 MG tablet TK 1 T PO QD    . ENTRESTO 24-26 MG TAKE 1 TABLET BY MOUTH TWICE DAILY 180 tablet 3  . ferrous sulfate 325 (65 FE) MG tablet Take 1 tablet (325 mg total) by mouth 2 (two) times daily with a meal. (Patient taking differently: Take 325 mg by mouth daily. ) 60 tablet 5  . furosemide (LASIX) 20 MG tablet TAKE 1 TABLET(20 MG) BY MOUTH DAILY AS NEEDED 90 tablet 1  . Magnesium 250 MG TABS Take 1 tablet by mouth daily.    . Multiple Vitamins-Minerals (CENTRUM SILVER PO) Take 1 tablet by mouth daily.      . Omega-3 Fatty Acids (FISH OIL) 1000 MG CAPS Take 1,200 mg by mouth daily.     . QUEtiapine (SEROQUEL) 25 MG tablet Take 2 tablets (50 mg total) by mouth at bedtime. 60 tablet 11  . spironolactone (ALDACTONE) 25 MG tablet Take 1 tablet (25 mg total) by mouth daily. 90 tablet 1   No current facility-administered medications on file prior to visit.    PAST MEDICAL HISTORY: Past Medical History:  Diagnosis Date  . Anxiety   . Breast cancer ([place])    left 1994, right 2007  . Diverticulosis   .  Diverticulosis of colon with hemorrhage 06/19/2014  . DVT (deep venous thrombosis) ([place]) 1992   right leg x 2  . Endometrial adenocarcinoma ([place]) 12/25/12   biopsy  . Family history of malignant neoplasm of gastrointestinal tract   . GERD (gastroesophageal reflux disease)   . History of blood transfusion   . Hx of radiation therapy 02/2006 - 03/2006   right breast  . Hx of radiation therapy 5/12, 5/19, 5/29, 6/2, 04/30/2013   proximal vagina, 30 Gy in 5 sessions, HDR  . Hypertension    . Iron deficiency   . Memory loss   . Peripheral vascular disease (HCC)    leg wounds  . Personal history of radiation therapy   . Ulcers of both lower extremities (HCC)    chronic  . Venous stasis    bilateral    PAST SURGICAL HISTORY: Past Surgical History:  Procedure Laterality Date  . ABDOMINAL HYSTERECTOMY  01/18/13   robotic, bso  . BREAST LUMPECTOMY Right   . COLONOSCOPY WITH PROPOFOL N/A 09/16/2017   Procedure: COLONOSCOPY WITH PROPOFOL;  Surgeon: Wilford Corner, MD;  Location: [place];  Service: Endoscopy;  Laterality: N/A;  Needs STAT CBC drawn before procedure  . MASTECTOMY Left 1994   left , Tamoxifen x 17 yrs  . partial mastectomy Right 2007   radiation    FAMILY HISTORY: The patient's family history includes Anuerysm in her sister; Breast cancer in her daughter and sister; Cancer in her brother and father; Colon cancer in her sister; Heart disease in her sister; Other in her mother; Prostate cancer in her brother.   SOCIAL HISTORY:  The patient  reports that she has never smoked. She has never used smokeless tobacco. She reports that she does not drink alcohol and does not use drugs.  Review of Systems  Constitutional: Negative for chills and fever.  HENT: Negative for hoarse voice and nosebleeds.   Eyes: Negative for discharge, double vision and pain.  Cardiovascular: Negative for chest pain, claudication, dyspnea on exertion, leg swelling, near-syncope, orthopnea, palpitations, paroxysmal nocturnal dyspnea and syncope.  Respiratory: Negative for hemoptysis and shortness of breath.   Musculoskeletal: Negative for muscle cramps and myalgias.       Right upper arm is painful and swollen.   Gastrointestinal: Negative for abdominal pain, constipation, diarrhea, hematemesis, hematochezia, melena, nausea and vomiting.  Neurological: Negative for dizziness and light-headedness.    PHYSICAL EXAM: Vitals with BMI 05/23/2020 07/09/2019 06/05/2019  Height 5\' 6"   5\' 6"  5\' 6"   Weight 181 lbs 181 lbs 177 lbs 8 oz  BMI 29.23 32.35 57.32  Systolic 202 542 706  Diastolic 70 75 73  Pulse 70 70 65    CONSTITUTIONAL: Age-appropriate female, hemodynamically stable, no acute distress.   SKIN: Skin is warm and dry. No rash noted. No cyanosis. No pallor. No jaundice HEAD: Normocephalic and atraumatic.  EYES: No scleral icterus MOUTH/THROAT: Moist oral membranes.  NECK: No JVD present. No thyromegaly noted. No carotid bruits  LYMPHATIC: No visible cervical adenopathy.  CHEST Normal respiratory effort. No intercostal retractions  LUNGS: Clear to auscultation bilaterally.  No stridor. No wheezes. No rales.  CARDIOVASCULAR: Regular, positive C3-J6, soft holosystolic murmur heard at the apex, no gallops or rubs. ABDOMINAL: No apparent ascites.  EXTREMITIES: No peripheral edema, wearing bilateral compression stockings up to the knee. HEMATOLOGIC: No significant bruising NEUROLOGIC: Oriented to person, place, and time. Nonfocal. Normal muscle tone.  PSYCHIATRIC: Normal mood and affect. Normal behavior. Cooperative  CARDIAC DATABASE: EKG: 07/09/2019:  Normal sinus rhythm at 72 bpm, normal axis, PRWP, cannot exclude anteroseptal infarct old. No evidence of ischemia.  05/23/2020: Normal sinus rhythm, 63 bpm, normal axis, poor R wave progression, anteroseptal infarct age undetermined, without underlying injury pattern.  No significant change compared to prior study dated 07/09/2019.  Echocardiogram: 08/22/2018: LVEF 44%, moderate LVH, mildly decreased global wall motion, grade 1 diastolic impairment.  Trace MR, moderate TR, RVSP 40 mmHg. 07/02/2019: LVEF 16%, grade 1 diastolic impairment, mild biatrial dilatation, trace AR, mild MR, moderate TR, PASP 44 mmHg.  Stress Testing:  None  Heart Catheterization: None   LABORATORY DATA: CBC Latest Ref Rng & Units 12/20/2018 08/08/2018 06/19/2018  WBC 4.0 - 10.5 K/uL 5.2 5.1 6.3  Hemoglobin 12.0 - 15.0 g/dL 10.5(L) 10.2(L)  10.8(L)  Hematocrit 36 - 46 % 32.0(L) 30.8(L) 32.4(L)  Platelets 150 - 400 K/uL 191 218 216    CMP Latest Ref Rng & Units 01/15/2019 12/20/2018 08/08/2018  Glucose 65 - 99 mg/dL 84 89 83  BUN 10 - 36 mg/dL 19 23 19   Creatinine 0.57 - 1.00 mg/dL 1.06(H) 1.08(H) 0.93  Sodium 134 - 144 mmol/L 143 141 143  Potassium 3.5 - 5.2 mmol/L 5.0 4.3 4.2  Chloride 96 - 106 mmol/L 103 103 105  CO2 20 - 29 mmol/L 19(L) 31 30  Calcium 8.7 - 10.3 mg/dL 9.8 9.7 9.3  Total Protein 6.5 - 8.1 g/dL - 6.8 6.5  Total Bilirubin 0.3 - 1.2 mg/dL - 0.4 0.8  Alkaline Phos 38 - 126 U/L - 62 51  AST 15 - 41 U/L - 12(L) 17  ALT 0 - 44 U/L - 7 16    Lipid Panel  No results found for: CHOL, TRIG, HDL, CHOLHDL, VLDL, LDLCALC, LDLDIRECT, LABVLDL  No results found for: HGBA1C No components found for: NTPROBNP No results found for: TSH  Cardiac Panel (last 3 results) No results for input(s): CKTOTAL, CKMB, TROPONINIHS, RELINDX in the last 72 hours.  IMPRESSION:    ICD-10-CM   1. Chronic diastolic heart failure (HCC)  I50.32 EKG 12-Lead  2. Essential hypertension  I10   3. History of deep venous thrombosis (DVT) of distal vein of right lower extremity  Z86.718   4. Chronic venous stasis dermatitis of both lower extremities  I87.2      RECOMMENDATIONS: [name] is a 86 y.o. female whose past medical history and cardiovascular risk factors include: Chronic heart failure with preserved EF, stage B, NYHA class II, history of DVT, GERD, history of GI bleed due to diverticulosis, chronic anemia, hypertension, chronic venous stasis, history of uterine cancer status post radiation therapy in 2016, history of breast cancer status postmastectomy and radiation therapy.  Chronic heart failure with preserved EF, stage B, NYHA class II:  Patient is currently euvolemic.  And no recent hospitalizations for heart failure exacerbation.  Continue Entresto, Lasix, spironolactone.  May consider the addition of  beta-blocker therapy at the next office visit.  Patient's blood pressure currently not well controlled, most likely secondary to not taking morning medications.  Blood pressures are within normal limits.  Educated on importance of low-salt diet and daily weights.  She is asked to call the office if she gains more than 2 pounds in a day or 3 pounds in a week.  Most recent echocardiogram reviewed.  Patient and daughter would like to continue with conservative management at this time given her age.  They would like to hold off on additional testing such as stress test which is  quite reasonable given their goals of care.  In addition, patient and daughter were recommended to have blood work done as she is on multiple medications that affect renal function and electrolytes.  Patient's daughter states that she is due for an annual physical with her primary care physician in September 2021 and will have them done at that time.  History of DVT: Currently not on oral anticoagulation, managed by primary team  Benign essential hypertension: Home blood pressures are well controlled.  Continue current medical therapy.  Low-salt diet recommended.  Currently managed by primary care provider.  Chronic venous stasis: Patient's daughter would like to have another prescription for compression stockings.   FINAL MEDICATION LIST END OF ENCOUNTER: No orders of the defined types were placed in this encounter.   There are no discontinued medications.   Current Outpatient Medications:  .  Cholecalciferol (VITAMIN D3) 1000 UNITS CAPS, Take 1,000 Units by mouth daily. , Disp: , Rfl:  .  citalopram (CELEXA) 20 MG tablet, TK 1 T PO QD, Disp: , Rfl:  .  ENTRESTO 24-26 MG, TAKE 1 TABLET BY MOUTH TWICE DAILY, Disp: 180 tablet, Rfl: 3 .  ferrous sulfate 325 (65 FE) MG tablet, Take 1 tablet (325 mg total) by mouth 2 (two) times daily with a meal. (Patient taking differently: Take 325 mg by mouth daily. ), Disp: 60  tablet, Rfl: 5 .  furosemide (LASIX) 20 MG tablet, TAKE 1 TABLET(20 MG) BY MOUTH DAILY AS NEEDED, Disp: 90 tablet, Rfl: 1 .  Magnesium 250 MG TABS, Take 1 tablet by mouth daily., Disp: , Rfl:  .  Multiple Vitamins-Minerals (CENTRUM SILVER PO), Take 1 tablet by mouth daily.  , Disp: , Rfl:  .  Omega-3 Fatty Acids (FISH OIL) 1000 MG CAPS, Take 1,200 mg by mouth daily. , Disp: , Rfl:  .  QUEtiapine (SEROQUEL) 25 MG tablet, Take 2 tablets (50 mg total) by mouth at bedtime., Disp: 60 tablet, Rfl: 11 .  spironolactone (ALDACTONE) 25 MG tablet, Take 1 tablet (25 mg total) by mouth daily., Disp: 90 tablet, Rfl: 1  Orders Placed This Encounter  Procedures  . EKG 12-Lead   Total time spent 35 minutes: Reviewing prior office notes, diagnostic tests, reviewing past history, coordinating care.  --Continue cardiac medications as reconciled in final medication list. --Return in about 6 months (around 11/23/2020) for heart failure management.. Or sooner if needed. --Continue follow-up with your primary care physician regarding the management of your other chronic comorbid conditions.  Patient's questions and concerns were addressed to her satisfaction. She voices understanding of the instructions provided during this encounter.   This note was created using a voice recognition software as a result there may be grammatical errors inadvertently enclosed that do not reflect the nature of this encounter. Every attempt is made to correct such errors.  Rex Kras, [place], [facility]  Pager: [phone] Office: [phone]

## 2020-05-27 ENCOUNTER — Ambulatory Visit (INDEPENDENT_AMBULATORY_CARE_PROVIDER_SITE_OTHER): Payer: Medicare PPO | Admitting: Podiatry

## 2020-05-27 ENCOUNTER — Encounter: Payer: Self-pay | Admitting: Podiatry

## 2020-05-27 ENCOUNTER — Other Ambulatory Visit: Payer: Self-pay

## 2020-05-27 VITALS — BP 150/84 | HR 66

## 2020-05-27 DIAGNOSIS — M79674 Pain in right toe(s): Secondary | ICD-10-CM | POA: Diagnosis not present

## 2020-05-27 DIAGNOSIS — B351 Tinea unguium: Secondary | ICD-10-CM

## 2020-05-27 DIAGNOSIS — M2041 Other hammer toe(s) (acquired), right foot: Secondary | ICD-10-CM | POA: Diagnosis not present

## 2020-05-27 DIAGNOSIS — M2011 Hallux valgus (acquired), right foot: Secondary | ICD-10-CM | POA: Diagnosis not present

## 2020-05-27 DIAGNOSIS — M79675 Pain in left toe(s): Secondary | ICD-10-CM

## 2020-05-27 DIAGNOSIS — M2042 Other hammer toe(s) (acquired), left foot: Secondary | ICD-10-CM

## 2020-05-27 DIAGNOSIS — M79671 Pain in right foot: Secondary | ICD-10-CM

## 2020-05-27 DIAGNOSIS — M79672 Pain in left foot: Secondary | ICD-10-CM

## 2020-05-27 DIAGNOSIS — L84 Corns and callosities: Secondary | ICD-10-CM | POA: Diagnosis not present

## 2020-05-27 DIAGNOSIS — M2012 Hallux valgus (acquired), left foot: Secondary | ICD-10-CM

## 2020-05-27 MED ORDER — NONFORMULARY OR COMPOUNDED ITEM: 3 refills | Status: DC

## 2020-05-27 NOTE — Patient Instructions (Addendum)
[facility] [phone] Antifungal nail solution  Onychomycosis/Fungal Toenails  WHAT IS IT? An infection that lies within the keratin of your nail plate that is caused by a fungus.  WHY ME? Fungal infections affect all ages, sexes, races, and creeds.  There may be many factors that predispose you to a fungal infection such as age, coexisting medical conditions such as diabetes, or an autoimmune disease; stress, medications, fatigue, genetics, etc.  Bottom line: fungus thrives in a warm, moist environment and your shoes offer such a location.  IS IT CONTAGIOUS? Theoretically, yes.  You do not want to share shoes, nail clippers or files with someone who has fungal toenails.  Walking around barefoot in the same room or sleeping in the same bed is unlikely to transfer the organism.  It is important to realize, however, that fungus can spread easily from one nail to the next on the same foot.  HOW DO WE TREAT THIS?  There are several ways to treat this condition.  Treatment may depend on many factors such as age, medications, pregnancy, liver and kidney conditions, etc.  It is best to ask your doctor which options are available to you.  1. No treatment.   Unlike many other medical concerns, you can live with this condition.  However for many people this can be a painful condition and may lead to ingrown toenails or a bacterial infection.  It is recommended that you keep the nails cut short to help reduce the amount of fungal nail. 2. Topical treatment.  These range from herbal remedies to prescription strength nail lacquers.  About 40-50% effective, topicals require twice daily application for approximately 9 to 12 months or until an entirely new nail has grown out.  The most effective topicals are medical grade medications available through physicians offices. 3. Oral antifungal medications.  With an 80-90% cure rate, the most common oral medication requires 3 to 4 months of therapy and stays  in your system for a year as the new nail grows out.  Oral antifungal medications do require blood work to make sure it is a safe drug for you.  A liver function panel will be performed prior to starting the medication and after the first month of treatment.  It is important to have the blood work performed to avoid any harmful side effects.  In general, this medication safe but blood work is required. 4. Laser Therapy.  This treatment is performed by applying a specialized laser to the affected nail plate.  This therapy is noninvasive, fast, and non-painful.  It is not covered by insurance and is therefore, out of pocket.  The results have been very good with a 80-95% cure rate.  The [facility] is the only practice in the area to offer this therapy. 5. Permanent Nail Avulsion.  Removing the entire nail so that a new nail will not grow back. 

## 2020-05-28 ENCOUNTER — Ambulatory Visit: Payer: Medicare PPO | Admitting: Orthopaedic Surgery

## 2020-05-28 ENCOUNTER — Encounter: Payer: Self-pay | Admitting: Orthopaedic Surgery

## 2020-05-28 ENCOUNTER — Ambulatory Visit (INDEPENDENT_AMBULATORY_CARE_PROVIDER_SITE_OTHER): Payer: Medicare PPO

## 2020-05-28 VITALS — Ht 66.0 in | Wt 181.0 lb

## 2020-05-28 DIAGNOSIS — M25561 Pain in right knee: Secondary | ICD-10-CM

## 2020-05-28 DIAGNOSIS — M1711 Unilateral primary osteoarthritis, right knee: Secondary | ICD-10-CM | POA: Diagnosis not present

## 2020-05-28 DIAGNOSIS — G8929 Other chronic pain: Secondary | ICD-10-CM | POA: Diagnosis not present

## 2020-05-28 DIAGNOSIS — M170 Bilateral primary osteoarthritis of knee: Secondary | ICD-10-CM

## 2020-05-28 DIAGNOSIS — M1712 Unilateral primary osteoarthritis, left knee: Secondary | ICD-10-CM

## 2020-05-28 MED ORDER — BUPIVACAINE HCL 0.5 % IJ SOLN
2.0000 mL | INTRAMUSCULAR | Status: AC | PRN
  Administered 2020-05-28: 2 mL via INTRA_ARTICULAR

## 2020-05-28 MED ORDER — METHYLPREDNISOLONE ACETATE 40 MG/ML IJ SUSP
80.0000 mg | INTRAMUSCULAR | Status: AC | PRN
  Administered 2020-05-28: 80 mg via INTRA_ARTICULAR

## 2020-05-28 MED ORDER — LIDOCAINE HCL 1 % IJ SOLN
2.0000 mL | INTRAMUSCULAR | Status: AC | PRN
  Administered 2020-05-28: 2 mL

## 2020-05-28 NOTE — Progress Notes (Signed)
Office Visit Note   Patient: [name]           Date of Birth: Apr 06, 1925           MRN: [id number] Visit Date: 05/28/2020              Requested by: Marda Stalker, PA-C [place],  [place] [id number] PCP: Marda Stalker, PA-C   Assessment & Plan: Visit Diagnoses:  1. Chronic pain of right knee   2. Bilateral primary osteoarthritis of knee     Plan: [name] is accompanied by her granddaughter.  She has had a chronic history of bilateral knee pain.  Having more trouble on the right than the left.  Films demonstrate end-stage osteoarthritis.  Long discussion over approximately 40 minutes regarding her present problem and treatment options.  We discussed over-the-counter medicines and cortisone injection.  She like to try the cortisone in the right more symptomatic knee and then will have her return in 2 weeks and consider injecting the left.  Follow-Up Instructions: Return in about 2 weeks (around 06/11/2020).   Orders:  Orders Placed This Encounter  Procedures  . Large Joint Inj: R knee  . XR KNEE 3 VIEW RIGHT   No orders of the defined types were placed in this encounter.     Procedures: Large Joint Inj: R knee on 05/28/2020 10:23 AM Indications: pain and diagnostic evaluation Details: 25 G 1.5 in needle, anteromedial approach  Arthrogram: No  Medications: 2 mL lidocaine 1 %; 2 mL bupivacaine 0.5 %; 80 mg methylPREDNISolone acetate 40 MG/ML Procedure, treatment alternatives, risks and benefits explained, specific risks discussed. Consent was given by the patient. Immediately prior to procedure a time out was called to verify the correct patient, procedure, equipment, support staff and site/side marked as required. Patient was prepped and draped in the usual sterile fashion.       Clinical Data: No additional findings.   Subjective: Chief Complaint  Patient presents with  . Right Knee - Pain  Patient presents today for the right  knee. She said that it has been hurting for at least a couple weeks. When it first started she had trouble ambulating. No known injury. She does not point to any certain place of pain. Her knee swells. No previous knee surgery or treatment. She states that she takes something for pain, but not sure what that is.  She is not diabetic.  Uses a rolling walker for balance  HPI  Review of Systems  Constitutional: Negative for fatigue.  HENT: Negative for ear pain.   Eyes: Negative for pain.  Respiratory: Negative for shortness of breath.   Cardiovascular: Negative for leg swelling.  Gastrointestinal: Negative for constipation and diarrhea.  Endocrine: Negative for cold intolerance and polydipsia.  Genitourinary: Negative for difficulty urinating.  Musculoskeletal: Positive for joint swelling.  Skin: Negative for rash.  Allergic/Immunologic: Negative for food allergies.  Neurological: Negative for weakness.  Hematological: Does not bruise/bleed easily.  Psychiatric/Behavioral: Negative for sleep disturbance.     Objective: Vital Signs: Ht 5\' 6"  (1.676 m)   Wt 181 lb (82.1 kg)   BMI 29.21 kg/m   Physical Exam Constitutional:      Appearance: She is well-developed.  Eyes:     Pupils: Pupils are equal, round, and reactive to light.  Pulmonary:     Effort: Pulmonary effort is normal.  Skin:    General: Skin is warm and dry.  Neurological:     Mental Status:  She is alert and oriented to person, place, and time.  Psychiatric:        Behavior: Behavior normal.     Ortho Exam awake alert and oriented x3.  Comfortable.  Very pleasant.  Accompanied by her granddaughter.  Uses a rolling walker for ambulation.  Large knees.  Increased varus.  Slight loss of full extension but it does not appear to be a functional loss of both knees.  Small effusions.  Knees were not hot warm or red.  No popliteal mass identified.  Flexed about 105 degrees right knee without any instability.  Some patella  crepitation but no pain.  Mild medial joint discomfort  Specialty Comments:  No specialty comments available.  Imaging: XR KNEE 3 VIEW RIGHT  Result Date: 05/28/2020 Films of the right knee were obtained in several projections standing.  There are end-stage osteoarthritis in all 3 compartments but predominantly in the medial compartment where there is bone-on-bone, subchondral sclerosis and peripheral osteophytes.  There is approximately 10 degrees of varus.  Significant degenerative changes are also noted at the patellofemoral joint and lateral compartment.  There might be faint calcification within the lateral meniscus possibly consistent with CPPD.  No acute changes    PMFS History: Patient Active Problem List   Diagnosis Date Noted  . Bilateral primary osteoarthritis of knee 05/28/2020  . Dementia ([place]) 06/05/2019  . Gait abnormality 06/05/2019  . Alzheimer's disease, unspecified (CODE) ([place]) 06/05/2019  . Bilateral leg edema 01/01/2019  . Chronic combined systolic and diastolic CHF (congestive heart failure) ([place]) 12/29/2018  . History of deep venous thrombosis (DVT) of distal vein of right lower extremity 08/14/2018  . Lower GI bleed 09/05/2017  . Diverticulosis of colon with hemorrhage 06/19/2014  . Rectal bleed 06/18/2014  . Essential hypertension 06/18/2014  . Acute lower GI bleeding 06/18/2014  . Endometrial cancer ([place]) 12/20/2013  . Endometrial adenocarcinoma ([place])   . Hx of radiation therapy   . Breast cancer ([place])   . DCIS (ductal carcinoma in situ) of breast 11/05/2011  . Family history of malignant neoplasm of gastrointestinal tract 08/17/2011  . Odynophagia 08/17/2011   Past Medical History:  Diagnosis Date  . Anxiety   . Breast cancer ([place])    left 1994, right 2007  . Diverticulosis   . Diverticulosis of colon with hemorrhage 06/19/2014  . DVT (deep venous thrombosis) ([place]) 1992   right leg x 2  . Endometrial adenocarcinoma ([place]) 12/25/12   biopsy  . Family  history of malignant neoplasm of gastrointestinal tract   . GERD (gastroesophageal reflux disease)   . History of blood transfusion   . Hx of radiation therapy 02/2006 - 03/2006   right breast  . Hx of radiation therapy 5/12, 5/19, 5/29, 6/2, 04/30/2013   proximal vagina, 30 Gy in 5 sessions, HDR  . Hypertension   . Iron deficiency   . Memory loss   . Peripheral vascular disease (HCC)    leg wounds  . Personal history of radiation therapy   . Ulcers of both lower extremities (HCC)    chronic  . Venous stasis    bilateral    Family History  Problem Relation Age of Onset  . Cancer Father        skin  . Other Mother        died during childbirth  . Cancer Brother        prostate  . Prostate cancer Brother   . Breast cancer Daughter   . Colon  cancer Sister   . Heart disease Sister        MI  . Breast cancer Sister   . Anuerysm Sister     Past Surgical History:  Procedure Laterality Date  . ABDOMINAL HYSTERECTOMY  01/18/13   robotic, bso  . BREAST LUMPECTOMY Right   . COLONOSCOPY WITH PROPOFOL N/A 09/16/2017   Procedure: COLONOSCOPY WITH PROPOFOL;  Surgeon: Wilford Corner, MD;  Location: [place];  Service: Endoscopy;  Laterality: N/A;  Needs STAT CBC drawn before procedure  . MASTECTOMY Left 1994   left , Tamoxifen x 17 yrs  . partial mastectomy Right 2007   radiation   Social History   Occupational History  . Occupation: Retired   Tobacco Use  . Smoking status: Never Smoker  . Smokeless tobacco: Never Used  Vaping Use  . Vaping Use: Never used  Substance and Sexual Activity  . Alcohol use: No  . Drug use: No  . Sexual activity: Never

## 2020-05-30 ENCOUNTER — Other Ambulatory Visit: Payer: Self-pay | Admitting: Neurology

## 2020-05-30 NOTE — Progress Notes (Signed)
Subjective: [name] presents today referred by [name] Stalker, PA-C for complaint of painful thick toenails that are difficult to trim. Pain interferes with ambulation. Aggravating factors include wearing enclosed shoe gear. Pain is relieved with periodic professional debridement.   Her daughter, [name], is present during today's visit. She states [name]' materal granddaughter has attempted to cut her toenails.   Past Medical History:  Diagnosis Date  . Anxiety   . Breast cancer ([place])    left 1994, right 2007  . Diverticulosis   . Diverticulosis of colon with hemorrhage 06/19/2014  . DVT (deep venous thrombosis) ([place]) 1992   right leg x 2  . Endometrial adenocarcinoma ([place]) 12/25/12   biopsy  . Family history of malignant neoplasm of gastrointestinal tract   . GERD (gastroesophageal reflux disease)   . History of blood transfusion   . Hx of radiation therapy 02/2006 - 03/2006   right breast  . Hx of radiation therapy 5/12, 5/19, 5/29, 6/2, 04/30/2013   proximal vagina, 30 Gy in 5 sessions, HDR  . Hypertension   . Iron deficiency   . Memory loss   . Peripheral vascular disease (H[facility])    leg wounds  . Personal history of radiation therapy   . Ulcers of both lower extremities (H[facility])    chronic  . Venous stasis    bilateral     Patient Active Problem List   Diagnosis Date Noted  . Bilateral primary osteoarthritis of knee 05/28/2020  . Dementia ([place]) 06/05/2019  . Gait abnormality 06/05/2019  . Alzheimer's disease, unspecified (CODE) ([place]) 06/05/2019  . Bilateral leg edema 01/01/2019  . Chronic combined systolic and diastolic CHF (congestive heart failure) ([place]) 12/29/2018  . History of deep venous thrombosis (DVT) of distal vein of right lower extremity 08/14/2018  . Lower GI bleed 09/05/2017  . Diverticulosis of colon with hemorrhage 06/19/2014  . Rectal bleed 06/18/2014  . Essential hypertension 06/18/2014  . Acute lower GI bleeding 06/18/2014  . Endometrial  cancer ([place]) 12/20/2013  . Endometrial adenocarcinoma ([place])   . Hx of radiation therapy   . Breast cancer ([place])   . DCIS (ductal carcinoma in situ) of breast 11/05/2011  . Family history of malignant neoplasm of gastrointestinal tract 08/17/2011  . Odynophagia 08/17/2011     Past Surgical History:  Procedure Laterality Date  . ABDOMINAL HYSTERECTOMY  01/18/13   robotic, bso  . BREAST LUMPECTOMY Right   . COLONOSCOPY WITH PROPOFOL N/A 09/16/2017   Procedure: COLONOSCOPY WITH PROPOFOL;  Surgeon: Wilford Corner, MD;  Location: [facility];  Service: Endoscopy;  Laterality: N/A;  Needs STAT CBC drawn before procedure  . MASTECTOMY Left 1994   left , Tamoxifen x 17 yrs  . partial mastectomy Right 2007   radiation     Current Outpatient Medications on File Prior to Visit  Medication Sig Dispense Refill  . Cholecalciferol (VITAMIN D3) 1000 UNITS CAPS Take 1,000 Units by mouth daily.     . citalopram (CELEXA) 20 MG tablet TK 1 T PO QD    . ENTRESTO 24-26 MG TAKE 1 TABLET BY MOUTH TWICE DAILY 180 tablet 3  . ferrous sulfate 325 (65 FE) MG tablet Take 1 tablet (325 mg total) by mouth 2 (two) times daily with a meal. (Patient taking differently: Take 325 mg by mouth daily. ) 60 tablet 5  . furosemide (LASIX) 20 MG tablet TAKE 1 TABLET(20 MG) BY MOUTH DAILY AS NEEDED 90 tablet 1  . Magnesium 250 MG TABS Take 1 tablet  by mouth daily.    . Multiple Vitamins-Minerals (CENTRUM SILVER PO) Take 1 tablet by mouth daily.      . Omega-3 Fatty Acids (FISH OIL) 1000 MG CAPS Take 1,200 mg by mouth daily.     . QUEtiapine (SEROQUEL) 25 MG tablet Take 2 tablets (50 mg total) by mouth at bedtime. 60 tablet 11  . spironolactone (ALDACTONE) 25 MG tablet Take 1 tablet (25 mg total) by mouth daily. 90 tablet 1   No current facility-administered medications on file prior to visit.     No Known Allergies   Social History   Occupational History  . Occupation: Retired   Tobacco Use  . Smoking status:  Never Smoker  . Smokeless tobacco: Never Used  Vaping Use  . Vaping Use: Never used  Substance and Sexual Activity  . Alcohol use: No  . Drug use: No  . Sexual activity: Never     Family History  Problem Relation Age of Onset  . Cancer Father        skin  . Other Mother        died during childbirth  . Cancer Brother        prostate  . Prostate cancer Brother   . Breast cancer Daughter   . Colon cancer Sister   . Heart disease Sister        MI  . Breast cancer Sister   . Anuerysm Sister       There is no immunization history on file for this patient.   Objective: [name] is a/an 86 y.o. female WD, WN in NAD.[name] AAO x 3. Vitals:   05/27/20 1321  BP: (!) 150/84  Pulse: 66    Vascular Examination:  Capillary refill time to digits immediate b/l. Palpable DP pulse(s) b/l lower extremities Faintly palpable PT pulse(s) b/l lower extremities. Pedal hair absent. Lower extremity skin temperature gradient within normal limits. No pain with calf compression b/l. No edema noted b/l lower extremities. Evidence of chronic venous insufficiency b/l LE.  Dermatological Examination: Pedal skin is thin shiny, atrophic b/l lower extremities. No open wounds bilaterally. No interdigital macerations bilaterally. Toenails 1-5 b/l elongated, discolored, dystrophic, thickened, crumbly with subungual debris and tenderness to dorsal palpation. Small scab noted right medial malleolus. No erythema, no edema, no drainage, no flocculence. Hyperkeratotic lesion(s) L hallux and L 2nd toe.  No erythema, no edema, no drainage, no flocculence.  She has scarring noted medial ankle/leg due to previous venous stasis ulceration.  Musculoskeletal: Normal muscle strength 5/5 to all lower extremity muscle groups bilaterally. No pain crepitus or joint limitation noted with ROM b/l. Hallux valgus with bunion deformity noted b/l lower extremities.  Neurological: Protective sensation intact 5/5 intact  bilaterally with 10g monofilament b/l. Vibratory sensation intact b/l. Proprioception intact bilaterally. Deep tendon reflexes normal b/l.  Clonus negative b/l.  Assessment: 1. Pain due to onychomycosis of toenails of both feet   2. Corns   3. Hallux valgus, acquired, bilateral   4. Acquired hammertoes of both feet   5. Pain in both feet    Plan: -Examined patient. -Toenails 1-5 b/l were debrided in length and girth with sterile nail nippers and dremel without iatrogenic bleeding.  -Discussed topical, laser and oral medication. Patient opted for topical treatment with compounded medication. Rx written for nonformulary compounding topical antifungal: [facility]: Antifungal cream - Terbinafine 3%, Fluconazole 2%, Tea Tree Oil 5%, Urea 10%, Ibuprofen 2% in DMSO Suspension #6ml. Apply to the affected nail(s) at  bedtime. -Corn(s) L hallux and L 2nd toe pared utilizing sterile scalpel blade without complication or incident. Total number debrided=2. -Instructed daughter to apply antibiotic ointment to scab once daily.  -Patient to report any pedal injuries to medical professional immediately. -Patient to continue soft, supportive shoe gear daily. -Patient/POA to call should there be question/concern in the interim.  Return in about 3 months (around 08/27/2020) for nail trim.

## 2020-06-10 ENCOUNTER — Telehealth: Payer: Self-pay

## 2020-06-10 NOTE — Telephone Encounter (Signed)
Spoke with patient is regards to stockings. There was a miscommunication within the office about the pt needing the stockings ordered in a certain color. Patient has not received the stockings yet, but did pay for them at check out. I informed office manager that stockings needed to be ordered and relayed information to patient. Will call patient back when stockings have arrived.

## 2020-06-11 ENCOUNTER — Other Ambulatory Visit: Payer: Self-pay

## 2020-06-11 ENCOUNTER — Encounter: Payer: Self-pay | Admitting: Orthopaedic Surgery

## 2020-06-11 ENCOUNTER — Ambulatory Visit: Payer: Medicare PPO | Admitting: Orthopaedic Surgery

## 2020-06-11 VITALS — Ht 66.0 in | Wt 181.0 lb

## 2020-06-11 DIAGNOSIS — M170 Bilateral primary osteoarthritis of knee: Secondary | ICD-10-CM

## 2020-06-11 DIAGNOSIS — M1712 Unilateral primary osteoarthritis, left knee: Secondary | ICD-10-CM

## 2020-06-11 MED ORDER — LIDOCAINE HCL 1 % IJ SOLN
2.0000 mL | INTRAMUSCULAR | Status: AC | PRN
  Administered 2020-06-11: 2 mL

## 2020-06-11 MED ORDER — METHYLPREDNISOLONE ACETATE 40 MG/ML IJ SUSP
80.0000 mg | INTRAMUSCULAR | Status: AC | PRN
Start: 2020-06-11 — End: 2020-06-11
  Administered 2020-06-11: 80 mg via INTRA_ARTICULAR

## 2020-06-11 MED ORDER — BUPIVACAINE HCL 0.5 % IJ SOLN
2.0000 mL | INTRAMUSCULAR | Status: AC | PRN
  Administered 2020-06-11: 2 mL via INTRA_ARTICULAR

## 2020-06-11 NOTE — Progress Notes (Signed)
Office Visit Note   Patient: [name]           Date of Birth: Oct 26, 1925           MRN: [id number] Visit Date: 06/11/2020              Requested by: Marda Stalker, PA-C [place],  [place] [address] PCP: Marda Stalker, PA-C   Assessment & Plan: Visit Diagnoses:  1. Bilateral primary osteoarthritis of knee     Plan: Cortisone injection left knee today.  Excellent response to cortisone injection right knee several weeks ago.  Have also discussed viscosupplementation with [name] and her granddaughter  Follow-Up Instructions: Return if symptoms worsen or fail to improve.   Orders:  Orders Placed This Encounter  Procedures  . Large Joint Inj: L knee   No orders of the defined types were placed in this encounter.     Procedures: Large Joint Inj: L knee on 06/11/2020 10:35 AM Indications: pain and diagnostic evaluation Details: 25 G 1.5 in needle, anteromedial approach  Arthrogram: No  Medications: 2 mL lidocaine 1 %; 2 mL bupivacaine 0.5 %; 80 mg methylPREDNISolone acetate 40 MG/ML Procedure, treatment alternatives, risks and benefits explained, specific risks discussed. Consent was given by the patient. Patient was prepped and draped in the usual sterile fashion.       Clinical Data: No additional findings.   Subjective: Chief Complaint  Patient presents with  . Right Knee - Follow-up  . Left Knee - Follow-up  Patient presents today for follow up on her knees. She was here on 05/28/2020 and had her right knee injected with cortisone. She states that it has improved her knee. She is wanting to get her left knee injected today. She takes Aspirin for pain if needed.   HPI  Review of Systems   Objective: Vital Signs: Ht 5\' 6"  (1.676 m)   Wt 181 lb (82.1 kg)   BMI 29.21 kg/m   Physical Exam Constitutional:      Appearance: She is well-developed.  Eyes:     Pupils: Pupils are equal, round, and reactive to light.    Pulmonary:     Effort: Pulmonary effort is normal.  Skin:    General: Skin is warm and dry.  Neurological:     Mental Status: She is alert and oriented to person, place, and time.  Psychiatric:        Behavior: Behavior normal.     Ortho Exam awake alert and oriented x3.  Comfortable sitting and walking.  Always nicely dressed.  Left knee with predominant medial joint pain.  Might have very minimal effusion.  Some patellar crepitation.  Lacks a few degrees to full extension but flexed over 100 degrees without instability  Specialty Comments:  No specialty comments available.  Imaging: No results found.   PMFS History: Patient Active Problem List   Diagnosis Date Noted  . Bilateral primary osteoarthritis of knee 05/28/2020  . Dementia ([place]) 06/05/2019  . Gait abnormality 06/05/2019  . Alzheimer's disease, unspecified (CODE) ([place]) 06/05/2019  . Bilateral leg edema 01/01/2019  . Chronic combined systolic and diastolic CHF (congestive heart failure) ([place]) 12/29/2018  . History of deep venous thrombosis (DVT) of distal vein of right lower extremity 08/14/2018  . Lower GI bleed 09/05/2017  . Diverticulosis of colon with hemorrhage 06/19/2014  . Rectal bleed 06/18/2014  . Essential hypertension 06/18/2014  . Acute lower GI bleeding 06/18/2014  . Endometrial cancer ([place]) 12/20/2013  .  Endometrial adenocarcinoma ([place])   . Hx of radiation therapy   . Breast cancer ([place])   . DCIS (ductal carcinoma in situ) of breast 11/05/2011  . Family history of malignant neoplasm of gastrointestinal tract 08/17/2011  . Odynophagia 08/17/2011   Past Medical History:  Diagnosis Date  . Anxiety   . Breast cancer ([place])    left 1994, right 2007  . Diverticulosis   . Diverticulosis of colon with hemorrhage 06/19/2014  . DVT (deep venous thrombosis) ([place]) 1992   right leg x 2  . Endometrial adenocarcinoma ([place]) 12/25/12   biopsy  . Family history of malignant neoplasm of gastrointestinal tract    . GERD (gastroesophageal reflux disease)   . History of blood transfusion   . Hx of radiation therapy 02/2006 - 03/2006   right breast  . Hx of radiation therapy 5/12, 5/19, 5/29, 6/2, 04/30/2013   proximal vagina, 30 Gy in 5 sessions, HDR  . Hypertension   . Iron deficiency   . Memory loss   . Peripheral vascular disease ([facility])    leg wounds  . Personal history of radiation therapy   . Ulcers of both lower extremities ([facility])    chronic  . Venous stasis    bilateral    Family History  Problem Relation Age of Onset  . Cancer Father        skin  . Other Mother        died during childbirth  . Cancer Brother        prostate  . Prostate cancer Brother   . Breast cancer Daughter   . Colon cancer Sister   . Heart disease Sister        MI  . Breast cancer Sister   . Anuerysm Sister     Past Surgical History:  Procedure Laterality Date  . ABDOMINAL HYSTERECTOMY  01/18/13   robotic, bso  . BREAST LUMPECTOMY Right   . COLONOSCOPY WITH PROPOFOL N/A 09/16/2017   Procedure: COLONOSCOPY WITH PROPOFOL;  Surgeon: Wilford Corner, MD;  Location: [facility];  Service: Endoscopy;  Laterality: N/A;  Needs STAT CBC drawn before procedure  . MASTECTOMY Left 1994   left , Tamoxifen x 17 yrs  . partial mastectomy Right 2007   radiation   Social History   Occupational History  . Occupation: Retired   Tobacco Use  . Smoking status: Never Smoker  . Smokeless tobacco: Never Used  Vaping Use  . Vaping Use: Never used  Substance and Sexual Activity  . Alcohol use: No  . Drug use: No  . Sexual activity: Never

## 2020-06-19 NOTE — Telephone Encounter (Signed)
Spoke with patient to inform her that stockings were here and ready for pickup at the front desk. Patient voiced understanding and will pick stockings up when able.

## 2020-06-20 ENCOUNTER — Other Ambulatory Visit: Payer: Self-pay

## 2020-06-20 ENCOUNTER — Ambulatory Visit
Admission: RE | Admit: 2020-06-20 | Discharge: 2020-06-20 | Disposition: A | Discharge status: Home / Self Care | Payer: Medicare PPO | Source: Ambulatory Visit | Attending: Family Medicine | Admitting: Family Medicine

## 2020-06-20 DIAGNOSIS — Z1231 Encounter for screening mammogram for malignant neoplasm of breast: Secondary | ICD-10-CM

## 2020-06-29 ENCOUNTER — Other Ambulatory Visit: Payer: Self-pay | Admitting: Neurology

## 2020-07-08 ENCOUNTER — Other Ambulatory Visit: Payer: Self-pay

## 2020-07-08 ENCOUNTER — Ambulatory Visit: Payer: Medicare PPO | Admitting: Family Medicine

## 2020-07-08 ENCOUNTER — Ambulatory Visit: Payer: Medicare Other | Admitting: Cardiology

## 2020-07-08 ENCOUNTER — Encounter: Payer: Self-pay | Admitting: Family Medicine

## 2020-07-08 VITALS — BP 131/71 | HR 65 | Ht 66.0 in | Wt 181.0 lb

## 2020-07-08 DIAGNOSIS — F0391 Unspecified dementia with behavioral disturbance: Secondary | ICD-10-CM

## 2020-07-08 DIAGNOSIS — R269 Unspecified abnormalities of gait and mobility: Secondary | ICD-10-CM

## 2020-07-08 MED ORDER — QUETIAPINE FUMARATE 25 MG PO TABS: ORAL_TABLET | ORAL | 3 refills | Status: DC

## 2020-07-08 NOTE — Patient Instructions (Signed)
We will continue Seroquel 50mg  at bedtime. You can split dose to 25mg  about 2 hours prior to bedtime and 25mg  at bedtime if you feel this works better. Please let us know if hallucinations worsen. We can consider geriatric psychiatry if needed.   Stay physically and mentally active.   Stay well hydrated  Follow up closely with care team  Follow up with me in 1 year    Memory Compensation Strategies  1. Use "WARM" strategy.  W= write it down  A= associate it  R= repeat it  M= make a mental note  2.   You can keep a Social worker.  Use a 3-ring notebook with sections for the following: calendar, important names and phone numbers,  medications, doctors' names/phone numbers, lists/reminders, and a section to journal what you did  each day.   3.    Use a calendar to write appointments down.  4.    Write yourself a schedule for the day.  This can be placed on the calendar or in a separate section of the Memory Notebook.  Keeping a  regular schedule can help memory.  5.    Use medication organizer with sections for each day or morning/evening pills.  You may need help loading it  6.    Keep a basket, or pegboard by the door.  Place items that you need to take out with you in the basket or on the pegboard.  You may also want to  include a message board for reminders.  7.    Use sticky notes.  Place sticky notes with reminders in a place where the task is performed.  For example: " turn off the  stove" placed by the stove, "lock the door" placed on the door at eye level, " take your medications" on  the bathroom mirror or by the place where you normally take your medications.  8.    Use alarms/timers.  Use while cooking to remind yourself to check on food or as a reminder to take your medicine, or as a  reminder to make a call, or as a reminder to perform another task, etc.   Quetiapine tablets What is this medicine? QUETIAPINE (kwe TYE a peen) is an antipsychotic. It is used to  treat schizophrenia and bipolar disorder, also known as manic-depression. This medicine may be used for other purposes; ask your health care provider or pharmacist if you have questions. COMMON BRAND NAME(S): Seroquel What should I tell my health care provider before I take this medicine? They need to know if you have any of these conditions:  blockage in your bowel  cataracts  constipation  dehydration  diabetes  difficulty swallowing  glaucoma  heart disease  history of breast cancer  kidney disease  liver disease  low blood counts, like low white cell, platelet, or red cell counts  low blood pressure or dizziness when standing up  Parkinson's disease  previous heart attack  prostate disease  seizures  stomach or intestine problems  suicidal thoughts, plans or attempt; a previous suicide attempt by you or a family member  thyroid disease  trouble passing urine  an unusual or allergic reaction to quetiapine, other medicines, foods, dyes, or preservatives  pregnant or trying to get pregnant  breast-feeding How should I use this medicine? Take this medicine by mouth. Swallow it with a drink of water. Follow the directions on the prescription label. If it upsets your stomach you can take it with food. Take  your medicine at regular intervals. Do not take it more often than directed. Do not stop taking except on the advice of your doctor or health care professional. A special MedGuide will be given to you by the pharmacist with each prescription and refill. Be sure to read this information carefully each time. Talk to your pediatrician regarding the use of this medicine in children. While this drug may be prescribed for children as young as 10 years for selected conditions, precautions do apply. Patients over age 24 years may have a stronger reaction to this medicine and need smaller doses. Overdosage: If you think you have taken too much of this medicine contact  a poison control center or emergency room at once. NOTE: This medicine is only for you. Do not share this medicine with others. What if I miss a dose? If you miss a dose, take it as soon as you can. If it is almost time for your next dose, take only that dose. Do not take double or extra doses. What may interact with this medicine? Do not take this medicine with any of the following medications:  cisapride  dronedarone  fluconazole  metoclopramide  pimozide  posaconazole  thioridazine This medicine may also interact with the following medications:  alcohol  antihistamines for allergy cough and cold  antiviral medicines for HIV or AIDS  atropine  certain medicines for bladder problems like oxybutynin, tolterodine  certain medicines for blood pressure  certain medicines for depression, anxiety, or psychotic disturbances  certain medicines for diabetes  certain medicines for stomach problems like dicyclomine, hyoscyamine  certain medicines for travel sickness like scopolamine  certain medicines for Parkinson's disease  certain medicines for seizures like carbamazepine, phenobarbital, phenytoin  cimetidine  erythromycin  ipratropium  other medicines that prolong the QT interval (cause an abnormal heart rhythm) like dofetilide  rifampin  steroid medicines like prednisone or cortisone This list may not describe all possible interactions. Give your health care provider a list of all the medicines, herbs, non-prescription drugs, or dietary supplements you use. Also tell them if you smoke, drink alcohol, or use illegal drugs. Some items may interact with your medicine. What should I watch for while using this medicine? Visit your health care professional for regular checks on your progress. Tell your health care professional if symptoms do not start to get better or if they get worse. Do not stop taking except on your health care professional's advice. You may  develop a severe reaction. Your health care professional will tell you how much medicine to take. You may need to have an eye exam before and during use of this medicine. This medicine may increase blood sugar. Ask your health care provider if changes in diet or medicines are needed if you have diabetes. Patients and their families should watch out for new or worsening depression or thoughts of suicide. Also watch out for sudden or severe changes in feelings such as feeling anxious, agitated, panicky, irritable, hostile, aggressive, impulsive, severely restless, overly excited and hyperactive, or not being able to sleep. If this happens, especially at the beginning of antidepressant treatment or after a change in dose, call your health care professional. Dennis Bast may get dizzy or drowsy. Do not drive, use machinery, or do anything that needs mental alertness until you know how this medicine affects you. Do not stand or sit up quickly, especially if you are an older patient. This reduces the risk of dizzy or fainting spells. Alcohol may interfere with the  effect of this medicine. Avoid alcoholic drinks. This drug can cause problems with controlling your body temperature. It can lower the response of your body to cold temperatures. If possible, stay indoors during cold weather. If you must go outdoors, wear warm clothes. It can also lower the response of your body to heat. Do not overheat. Do not over-exercise. Stay out of the sun when possible. If you must be in the sun, wear cool clothing. Drink plenty of water. If you have trouble controlling your body temperature, call your health care provider right away. What side effects may I notice from receiving this medicine? Side effects that you should report to your doctor or health care professional as soon as possible:  allergic reactions like skin rash, itching or hives, swelling of the face, lips, or tongue  breathing problems  changes in  vision  confusion  elevated mood, decreased need for sleep, racing thoughts, impulsive behavior  eye pain  fast, irregular heartbeat  fever or chills, sore throat  inability to keep still  males: prolonged or painful erection  problems with balance, talking, walking  redness, blistering, peeling, or loosening of the skin, including inside the mouth  seizures  signs and symptoms of high blood sugar such as being more thirsty or hungry or having to urinate more than normal. You may also feel very tired or have blurry vision  signs and symptoms of hypothyroidism like fatigue; increased sensitivity to cold; weight gain; hoarseness; thinning hair  signs and symptoms of low blood pressure like dizziness; feeling faint or lightheaded; falls; unusually weak or tired  signs and symptoms of neuroleptic malignant syndrome like confusion; fast, irregular heartbeat; high fever; increased sweating; stiff muscles  sudden numbness or weakness of the face, arm, or leg  suicidal thoughts or other mood changes  trouble swallowing  uncontrollable movements of the arms, face, head, mouth, neck, or upper body Side effects that usually do not require medical attention (report to your doctor or health care professional if they continue or are bothersome):  change in sex drive or performance  constipation  drowsiness  dry mouth  upset stomach  weight gain This list may not describe all possible side effects. Call your doctor for medical advice about side effects. You may report side effects to FDA at 1-800-FDA-1088. Where should I keep my medicine? Keep out of the reach of children. Store at room temperature between 15 and 30 degrees C (59 and 86 degrees F). Throw away any unused medicine after the expiration date. NOTE: This sheet is a summary. It may not cover all possible information. If you have questions about this medicine, talk to your doctor, pharmacist, or health care  provider.  2020 Elsevier/Gold Standard (2019-09-05 11:59:11)   Dementia Caregiver Guide Dementia is a term used to describe a number of symptoms that affect memory and thinking. The most common symptoms include:  Memory loss.  Trouble with language and communication.  Trouble concentrating.  Poor judgment.  Problems with reasoning.  Child-like behavior and language.  Extreme anxiety.  Angry outbursts.  Wandering from home or public places. Dementia usually gets worse slowly over time. In the early stages, people with dementia can stay independent and safe with some help. In later stages, they need help with daily tasks such as dressing, grooming, and using the bathroom. How to help the person with dementia cope Dementia can be frightening and confusing. Here are some tips to help the person with dementia cope with changes caused by  the disease. General tips  Keep the person on track with his or her routine.  Try to identify areas where the person may need help.  Be supportive, patient, calm, and encouraging.  Gently remind the person that adjusting to changes takes time.  Help with the tasks that the person has asked for help with.  Keep the person involved in daily tasks and decisions as much as possible.  Encourage conversation, but try not to get frustrated or harried if the person struggles to find words or does not seem to appreciate your help. Communication tips  When the person is talking or seems frustrated, make eye contact and hold the person's hand.  Ask specific questions that need yes or no answers.  Use simple words, short sentences, and a calm voice. Only give one direction at a time.  When offering choices, limit them to just 1 or 2.  Avoid correcting the person in a negative way.  If the person is struggling to find the right words, gently try to help him or her. How to recognize symptoms of stress Symptoms of stress in caregivers  include:  Feeling frustrated or angry with the person with dementia.  Denying that the person has dementia or that his or her symptoms will not improve.  Feeling hopeless and unappreciated.  Difficulty sleeping.  Difficulty concentrating.  Feeling anxious, irritable, or depressed.  Developing stress-related health problems.  Feeling like you have too little time for your own life. Follow these instructions at home:   Make sure that you and the person you are caring for: ? Get regular sleep. ? Exercise regularly. ? Eat regular, nutritious meals. ? Drink enough fluid to keep your urine clear or pale yellow. ? Take over-the-counter and prescription medicines only as told by your health care providers. ? Attend all scheduled health care appointments.  Join a support group with others who are caregivers.  Ask about respite care resources so that you can have a regular break from the stress of caregiving.  Look for signs of stress in yourself and in the person you are caring for. If you notice signs of stress, take steps to manage it.  Consider any safety risks and take steps to avoid them.  Organize medications in a pill box for each day of the week.  Create a plan to handle any legal or financial matters. Get legal or financial advice if needed.  Keep a calendar in a central location to remind the person of appointments or other activities. Tips for reducing the risk of injury  Keep floors clear of clutter. Remove rugs, magazine racks, and floor lamps.  Keep hallways well lit, especially at night.  Put a handrail and nonslip mat in the bathtub or shower.  Put childproof locks on cabinets that contain dangerous items, such as medicines, alcohol, guns, toxic cleaning items, sharp tools or utensils, matches, and lighters.  Put the locks in places where the person cannot see or reach them easily. This will help ensure that the person does not wander out of the house and get  lost.  Be prepared for emergencies. Keep a list of emergency phone numbers and addresses in a convenient area.  Remove car keys and lock garage doors so that the person does not try to get in the car and drive.  Have the person wear a bracelet that tracks locations and identifies the person as having memory problems. This should be worn at all times for safety. Where to find  support: Many individuals and organizations offer support. These include:  Support groups for people with dementia and for caregivers.  Counselors or therapists.  Home health care services.  Adult day care centers. Where to find more information [employer]: [employer] Contact a health care provider if:  The person's health is rapidly getting worse.  You are no longer able to care for the person.  Caring for the person is affecting your physical and emotional health.  The person threatens himself or herself, you, or anyone else. Summary  Dementia is a term used to describe a number of symptoms that affect memory and thinking.  Dementia usually gets worse slowly over time.  Take steps to reduce the person's risk of injury, and to plan for future care.  Caregivers need support, relief from caregiving, and time for their own lives. This information is not intended to replace advice given to you by your health care provider. Make sure you discuss any questions you have with your health care provider. Document Revised: 10/21/2017 Document Reviewed: 10/12/2016 Elsevier Patient Education  2020 [employer].

## 2020-07-08 NOTE — Progress Notes (Signed)
PATIENT: [name] DOB: 07/09/1925  REASON FOR VISIT: follow up HISTORY FROM: patient  Chief Complaint  Patient presents with  . Follow-up    gait, memory, rm 2, with daughter, daughter states pt has Hallucinations, needs more assistance walking       HISTORY OF PRESENT ILLNESS:  [name] is a 86 year old female, seen in request by her primary care PA Marda Stalker for evaluation of dementia, initial evaluation was on June 05, 2019.  She is accompanied by her granddaughter [name] at today's visit.  I have reviewed and summarized the referring note from the referring physician.  She had past medical history of breast cancer, left side was treated in 1994, right side was in 2007, she also had a history of DVT, endometrial adenocarcinoma, peripheral vascular disease, she lives at home with her daughter, the care of her 1.05 is [name].  She began to have gradual onset memory loss since 2019, getting worse rapidly, she is independent in daily activity, no longer driving, but still make simple meals, dressing, toileting, bathing without help, she has decreased appetite, sometimes has difficulty sleeping  She has gradual worsening visual hallucinations, she see people at her home, talking, irritated sometimes scared her much, she got up in the middle of the night, she has to use the cane to knock on the wall to stop them talking.  This happened mostly at the evening time, sometimes at daytime as well,  She ambulate with a cane due to low back, joint pain,  Laboratory evaluations in April 2020, hematocrit 34, creatinine 1.05, normal B12, TSH 3.57, Personally reviewed CT head in April 2020: Generalized atrophy, small vessel disease, no acute abnormalities.   UPDATE 07/08/20 ALL: [name] is a 86 y.o. female here today for follow up. She continues Seroquel 50mg  at bedtime. Her daughter presents today and aids in history. She reports that Seroquel has  helped significantly with sleep. She is resting well. She does continue to have hallucinations but feel they are not as frequent. Hallucinations are auditory and visual and occur several times a week. She is sometimes frightened but easily reassured. She remains active. She lives with her daughter. She is able to do her own grocery shopping. She does not drive. She is needing more assistance with walking. No falls. She uses a cane for short distances and a Rolator for longer distances. She is able to performs ADL's. She requires assistance with medications. Family decided against MRI and PT ordered at last visit. They do not feel these are needed.     REVIEW OF SYSTEMS: Out of a complete 14 system review of symptoms, the patient complains only of the following symptoms, memory loss, and all other reviewed systems are negative.   ALLERGIES: No Known Allergies  HOME MEDICATIONS: Outpatient Medications Prior to Visit  Medication Sig Dispense Refill  . Cholecalciferol (VITAMIN D3) 1000 UNITS CAPS Take 1,000 Units by mouth daily.     . citalopram (CELEXA) 20 MG tablet TK 1 T PO QD    . ENTRESTO 24-26 MG TAKE 1 TABLET BY MOUTH TWICE DAILY 180 tablet 3  . ferrous sulfate 325 (65 FE) MG tablet Take 1 tablet (325 mg total) by mouth 2 (two) times daily with a meal. (Patient taking differently: Take 325 mg by mouth daily. ) 60 tablet 5  . furosemide (LASIX) 20 MG tablet TAKE 1 TABLET(20 MG) BY MOUTH DAILY AS NEEDED 90 tablet 1  . Magnesium 250 MG TABS Take 1  tablet by mouth daily.    . Multiple Vitamins-Minerals (CENTRUM SILVER PO) Take 1 tablet by mouth daily.      . NONFORMULARY OR COMPOUNDED ITEM Antifungal solution: Terbinafine 3%, Fluconazole 2%, Tea Tree Oil 5%, Urea 10%, Ibuprofen 2% in DMSO suspension #55mL 1 each 3  . Omega-3 Fatty Acids (FISH OIL) 1000 MG CAPS Take 1,200 mg by mouth daily.     . QUEtiapine (SEROQUEL) 25 MG tablet TAKE 2 TABLETS(50 MG) BY MOUTH AT BEDTIME 60 tablet 0  .  spironolactone (ALDACTONE) 25 MG tablet Take 1 tablet (25 mg total) by mouth daily. 90 tablet 1   No facility-administered medications prior to visit.    PAST MEDICAL HISTORY: Past Medical History:  Diagnosis Date  . Anxiety   . Breast cancer ([place])    left 1994, right 2007  . Diverticulosis   . Diverticulosis of colon with hemorrhage 06/19/2014  . DVT (deep venous thrombosis) ([place]) 1992   right leg x 2  . Endometrial adenocarcinoma ([place]) 12/25/12   biopsy  . Family history of malignant neoplasm of gastrointestinal tract   . GERD (gastroesophageal reflux disease)   . History of blood transfusion   . Hx of radiation therapy 02/2006 - 03/2006   right breast  . Hx of radiation therapy 5/12, 5/19, 5/29, 6/2, 04/30/2013   proximal vagina, 30 Gy in 5 sessions, HDR  . Hypertension   . Iron deficiency   . Memory loss   . Peripheral vascular disease ([facility])    leg wounds  . Personal history of radiation therapy   . Ulcers of both lower extremities ([facility])    chronic  . Venous stasis    bilateral    PAST SURGICAL HISTORY: Past Surgical History:  Procedure Laterality Date  . ABDOMINAL HYSTERECTOMY  01/18/13   robotic, bso  . BREAST LUMPECTOMY Right   . COLONOSCOPY WITH PROPOFOL N/A 09/16/2017   Procedure: COLONOSCOPY WITH PROPOFOL;  Surgeon: Wilford Corner, MD;  Location: [facility];  Service: Endoscopy;  Laterality: N/A;  Needs STAT CBC drawn before procedure  . MASTECTOMY Left 1994   left , Tamoxifen x 17 yrs  . partial mastectomy Right 2007   radiation    FAMILY HISTORY: Family History  Problem Relation Age of Onset  . Cancer Father        skin  . Other Mother        died during childbirth  . Cancer Brother        prostate  . Prostate cancer Brother   . Breast cancer Daughter   . Colon cancer Sister   . Heart disease Sister        MI  . Breast cancer Sister   . Anuerysm Sister     SOCIAL HISTORY: Social History   Socioeconomic History  . Marital status: Widowed     Spouse name: Not on file  . Number of children: 5  . Years of education: 2nd or 3rd grade  . Highest education level: Not on file  Occupational History  . Occupation: Retired   Tobacco Use  . Smoking status: Never Smoker  . Smokeless tobacco: Never Used  Vaping Use  . Vaping Use: Never used  Substance and Sexual Activity  . Alcohol use: No  . Drug use: No  . Sexual activity: Never  Other Topics Concern  . Not on file  Social History Narrative   No caffeine use.   Lives at home with her daughter.   Right-handed.  Social Determinants of Health   Financial Resource Strain:   . Difficulty of Paying Living Expenses:   Food Insecurity:   . Worried About Charity fundraiser in the Last Year:   . Arboriculturist in the Last Year:   Transportation Needs:   . Film/video editor (Medical):   [name] Lack of Transportation (Non-Medical):   Physical Activity:   . Days of Exercise per Week:   . Minutes of Exercise per Session:   Stress:   . Feeling of Stress :   Social Connections:   . Frequency of Communication with Friends and Family:   . Frequency of Social Gatherings with Friends and Family:   . Attends Religious Services:   . Active Member of Clubs or Organizations:   . Attends Archivist Meetings:   [name] Marital Status:   Intimate Partner Violence:   . Fear of Current or Ex-Partner:   . Emotionally Abused:   [name] Physically Abused:   . Sexually Abused:       PHYSICAL EXAM  Vitals:   07/08/20 1302  BP: 131/71  Pulse: 65  Weight: 181 lb (82.1 kg)  Height: 5\' 6"  (1.676 m)   Body mass index is 29.21 kg/m.  Generalized: Well developed, in no acute distress  Cardiology: normal rate and rhythm, no murmur noted Respiratory: clear to auscultation bilaterally  Neurological examination  Mentation: Alert, pleasant, not oriented to time, place, but able to assist with some history taking. Follows all commands speech and language fluent Cranial nerve II-XII:  Pupils were equal round reactive to light. Extraocular movements were full, visual field were full  Motor: The motor testing reveals 5 over 5 strength of all 4 extremities. Good symmetric motor tone is noted throughout.  Sensory: Sensory testing is intact to soft touch on all 4 extremities. No evidence of extinction is noted.  Coordination: Cerebellar testing reveals good finger-nose-finger and heel-to-shin bilaterally but requires repetitive redirection.  Gait and station: Gait is short but stable with four pronged cane   DIAGNOSTIC DATA (LABS, IMAGING, TESTING) - I reviewed patient records, labs, notes, testing and imaging myself where available.  MMSE - Mini Mental State Exam 06/05/2019  Orientation to time 2  Orientation to Place 3  Registration 3  Attention/ Calculation 0  Recall 0  Language- name 2 objects 2  Language- repeat 1  Language- follow 3 step command 3  Language- read & follow direction 1  Write a sentence 0  Copy design 0  Total score 15    No flowsheet data found.   Lab Results  Component Value Date   WBC 5.2 12/20/2018   HGB 10.5 (L) 12/20/2018   HCT 32.0 (L) 12/20/2018   MCV 87.2 12/20/2018   PLT 191 12/20/2018      Component Value Date/Time   NA 143 01/15/2019 1152   K 5.0 01/15/2019 1152   CL 103 01/15/2019 1152   CO2 19 (L) 01/15/2019 1152   GLUCOSE 84 01/15/2019 1152   GLUCOSE 89 12/20/2018 1137   BUN 19 01/15/2019 1152   CREATININE 1.06 (H) 01/15/2019 1152   CREATININE 1.08 (H) 12/20/2018 1137   CALCIUM 9.8 01/15/2019 1152   PROT 6.8 12/20/2018 1137   ALBUMIN 3.5 12/20/2018 1137   AST 12 (L) 12/20/2018 1137   ALT 7 12/20/2018 1137   ALKPHOS 62 12/20/2018 1137   BILITOT 0.4 12/20/2018 1137   GFRNONAA 46 (L) 01/15/2019 1152   GFRNONAA 45 (L) 12/20/2018 1137  GFRAA 53 (L) 01/15/2019 1152   GFRAA 52 (L) 12/20/2018 1137   No results found for: CHOL, HDL, LDLCALC, LDLDIRECT, TRIG, CHOLHDL No results found for: HGBA1C Lab Results    Component Value Date   VITAMINB12 1,566 (H) 01/02/2018   No results found for: TSH     ASSESSMENT AND PLAN 86 y.o. year old female  has a past medical history of Anxiety, Breast cancer ([place]), Diverticulosis, Diverticulosis of colon with hemorrhage (06/19/2014), DVT (deep venous thrombosis) ([place]) (1992), Endometrial adenocarcinoma ([place]) (12/25/12), Family history of malignant neoplasm of gastrointestinal tract, GERD (gastroesophageal reflux disease), History of blood transfusion, radiation therapy (02/2006 - 03/2006), radiation therapy (5/12, 5/19, 5/29, 6/2, 04/30/2013), Hypertension, Iron deficiency, Memory loss, Peripheral vascular disease ([place]), Personal history of radiation therapy, Ulcers of both lower extremities ([place]), and Venous stasis. here with     ICD-10-CM   1. Dementia with behavioral disturbance, unspecified dementia type ([place])  F03.91   2. Gait abnormality  R26.9     Ms Jock is doing fairly well. We will continue Seroquel 50mg  daily. We have discussed concerns of hallucinations. Her daughter feels they are improved on Seroquel. We have discussed referral to geriatric psychiatry should symptoms worsen. She was encouraged to stay physically and mentally active. Memory compensation strategies reviewed. She will continue close follow up with PCP. Healthy lifestyle habits encouraged. Fall and safety precautions advised. She will follow up with [place] in 1 year, sooner if needed.    No orders of the defined types were placed in this encounter.    No orders of the defined types were placed in this encounter.     I spent 15 minutes with the patient. 50% of this time was spent counseling and educating patient on plan of care and medications.    Debbora Presto, FNP-C 07/08/2020, 2:13 PM [facility] [address] [place], [place] [address] [phone]

## 2020-08-02 ENCOUNTER — Other Ambulatory Visit: Payer: Self-pay | Admitting: Cardiology

## 2020-08-02 DIAGNOSIS — I5042 Chronic combined systolic (congestive) and diastolic (congestive) heart failure: Secondary | ICD-10-CM

## 2020-08-10 ENCOUNTER — Other Ambulatory Visit: Payer: Self-pay | Admitting: Cardiology

## 2020-08-10 DIAGNOSIS — I5042 Chronic combined systolic (congestive) and diastolic (congestive) heart failure: Secondary | ICD-10-CM

## 2020-08-20 DIAGNOSIS — G479 Sleep disorder, unspecified: Secondary | ICD-10-CM | POA: Diagnosis not present

## 2020-08-20 DIAGNOSIS — R195 Other fecal abnormalities: Secondary | ICD-10-CM | POA: Diagnosis not present

## 2020-08-20 DIAGNOSIS — D649 Anemia, unspecified: Secondary | ICD-10-CM | POA: Diagnosis not present

## 2020-08-20 DIAGNOSIS — R413 Other amnesia: Secondary | ICD-10-CM | POA: Diagnosis not present

## 2020-08-20 DIAGNOSIS — I10 Essential (primary) hypertension: Secondary | ICD-10-CM | POA: Diagnosis not present

## 2020-08-20 DIAGNOSIS — Z23 Encounter for immunization: Secondary | ICD-10-CM | POA: Diagnosis not present

## 2020-08-20 DIAGNOSIS — Z8542 Personal history of malignant neoplasm of other parts of uterus: Secondary | ICD-10-CM | POA: Diagnosis not present

## 2020-08-20 DIAGNOSIS — Z0000 Encounter for general adult medical examination without abnormal findings: Secondary | ICD-10-CM | POA: Diagnosis not present

## 2020-08-20 DIAGNOSIS — Z853 Personal history of malignant neoplasm of breast: Secondary | ICD-10-CM | POA: Diagnosis not present

## 2020-08-20 DIAGNOSIS — I872 Venous insufficiency (chronic) (peripheral): Secondary | ICD-10-CM | POA: Diagnosis not present

## 2020-08-26 ENCOUNTER — Other Ambulatory Visit: Payer: Self-pay

## 2020-08-26 ENCOUNTER — Ambulatory Visit: Payer: Medicare PPO | Admitting: Podiatry

## 2020-08-26 ENCOUNTER — Encounter: Payer: Self-pay | Admitting: Podiatry

## 2020-08-26 DIAGNOSIS — B351 Tinea unguium: Secondary | ICD-10-CM

## 2020-08-26 DIAGNOSIS — L601 Onycholysis: Secondary | ICD-10-CM

## 2020-08-26 DIAGNOSIS — M79675 Pain in left toe(s): Secondary | ICD-10-CM | POA: Diagnosis not present

## 2020-08-26 DIAGNOSIS — M79674 Pain in right toe(s): Secondary | ICD-10-CM | POA: Diagnosis not present

## 2020-08-26 MED ORDER — CLOTRIMAZOLE 1 % EX CREA: TOPICAL_CREAM | CUTANEOUS | 2 refills | Status: DC

## 2020-08-26 NOTE — Patient Instructions (Signed)
Discontinue topical antfungal from [place] to both great toes and right 2nd digit.  Apply topical antifungal cream to exposed nailbeds of both great toes and right 2nd toe once daily. Use q-tips or  gloves to apply medication.

## 2020-08-26 NOTE — Progress Notes (Signed)
Subjective:  Patient ID: [name], female    DOB: December 04, 1924,  MRN: [id number]  86 y.o. female presents with painful thick toenails that are difficult to trim. Pain interferes with ambulation. Aggravating factors include wearing enclosed shoe gear. Pain is relieved with periodic professional debridement.   Daughter is present during today's visit. She states Mom has been complaining of pain in the left great toe. Denies any drainage redness or swelling.  Daughter's sister has been applying topical antifungal solution to toenails once daily as instructed.  Review of Systems: Negative except as noted in the HPI.  Past Medical History:  Diagnosis Date  . Anxiety   . Breast cancer ([place])    left 1994, right 2007  . Diverticulosis   . Diverticulosis of colon with hemorrhage 06/19/2014  . DVT (deep venous thrombosis) ([place]) 1992   right leg x 2  . Endometrial adenocarcinoma ([place]) 12/25/12   biopsy  . Family history of malignant neoplasm of gastrointestinal tract   . GERD (gastroesophageal reflux disease)   . History of blood transfusion   . Hx of radiation therapy 02/2006 - 03/2006   right breast  . Hx of radiation therapy 5/12, 5/19, 5/29, 6/2, 04/30/2013   proximal vagina, 30 Gy in 5 sessions, HDR  . Hypertension   . Iron deficiency   . Memory loss   . Peripheral vascular disease (H[facility])    leg wounds  . Personal history of radiation therapy   . Ulcers of both lower extremities ([facility])    chronic  . Venous stasis    bilateral   Past Surgical History:  Procedure Laterality Date  . ABDOMINAL HYSTERECTOMY  01/18/13   robotic, bso  . BREAST LUMPECTOMY Right   . COLONOSCOPY WITH PROPOFOL N/A 09/16/2017   Procedure: COLONOSCOPY WITH PROPOFOL;  Surgeon: Wilford Corner, MD;  Location: [facility];  Service: Endoscopy;  Laterality: N/A;  Needs STAT CBC drawn before procedure  . MASTECTOMY Left 1994   left , Tamoxifen x 17 yrs  . partial mastectomy Right 2007   radiation   Patient  Active Problem List   Diagnosis Date Noted  . Bilateral primary osteoarthritis of knee 05/28/2020  . Dementia ([place]) 06/05/2019  . Gait abnormality 06/05/2019  . Alzheimer's disease, unspecified (CODE) ([place]) 06/05/2019  . Bilateral leg edema 01/01/2019  . Chronic combined systolic and diastolic CHF (congestive heart failure) ([place]) 12/29/2018  . History of deep venous thrombosis (DVT) of distal vein of right lower extremity 08/14/2018  . Lower GI bleed 09/05/2017  . Diverticulosis of colon with hemorrhage 06/19/2014  . Rectal bleed 06/18/2014  . Essential hypertension 06/18/2014  . Acute lower GI bleeding 06/18/2014  . Endometrial cancer ([place]) 12/20/2013  . Endometrial adenocarcinoma ([place])   . Hx of radiation therapy   . Breast cancer ([place])   . DCIS (ductal carcinoma in situ) of breast 11/05/2011  . Family history of malignant neoplasm of gastrointestinal tract 08/17/2011  . Odynophagia 08/17/2011    Current Outpatient Medications:  .  acetaminophen (TYLENOL) 325 MG tablet, Take 650 mg by mouth every 6 (six) hours as needed., Disp: , Rfl:  .  beclomethasone (QVAR) 80 MCG/ACT inhaler, Inhale into the lungs 2 (two) times daily., Disp: , Rfl:  .  calcium carbonate (OS-CAL - DOSED IN MG OF ELEMENTAL CALCIUM) 1250 (500 Ca) MG tablet, Take 1 tablet by mouth., Disp: , Rfl:  .  cetirizine (ZYRTEC) 10 MG tablet, Take 10 mg by mouth daily., Disp: , Rfl:  .  Cholecalciferol (  VITAMIN D3) 1000 UNITS CAPS, Take 1,000 Units by mouth daily. , Disp: , Rfl:  .  citalopram (CELEXA) 20 MG tablet, TK 1 T PO QD, Disp: , Rfl:  .  docusate sodium (COLACE) 100 MG capsule, Take 100 mg by mouth 2 (two) times daily., Disp: , Rfl:  .  ENTRESTO 24-26 MG, TAKE 1 TABLET BY MOUTH TWICE DAILY, Disp: 180 tablet, Rfl: 3 .  EPINEPHrine (ADRENALIN) 0.1 % nasal solution, Place 1 drop into the nose once., Disp: , Rfl:  .  ferrous sulfate 325 (65 FE) MG tablet, Take 1 tablet (325 mg total) by mouth 2 (two) times daily with a  meal. (Patient taking differently: Take 325 mg by mouth daily. ), Disp: 60 tablet, Rfl: 5 .  fluticasone (FLOVENT HFA) 110 MCG/ACT inhaler, Inhale into the lungs 2 (two) times daily., Disp: , Rfl:  .  furosemide (LASIX) 20 MG tablet, TAKE 1 TABLET(20 MG) BY MOUTH DAILY AS NEEDED, Disp: 90 tablet, Rfl: 1 .  Magnesium 250 MG TABS, Take 1 tablet by mouth daily., Disp: , Rfl:  .  Multiple Vitamins-Minerals (CENTRUM SILVER PO), Take 1 tablet by mouth daily.  , Disp: , Rfl:  .  NONFORMULARY OR COMPOUNDED ITEM, Antifungal solution: Terbinafine 3%, Fluconazole 2%, Tea Tree Oil 5%, Urea 10%, Ibuprofen 2% in DMSO suspension #15mL, Disp: 1 each, Rfl: 3 .  Omega-3 Fatty Acids (FISH OIL) 1000 MG CAPS, Take 1,200 mg by mouth daily. , Disp: , Rfl:  .  omeprazole (PRILOSEC) 40 MG capsule, Take 40 mg by mouth daily., Disp: , Rfl:  .  QUEtiapine (SEROQUEL) 25 MG tablet, TAKE 2 TABLETS(50 MG) BY MOUTH AT BEDTIME, Disp: 180 tablet, Rfl: 3 .  spironolactone (ALDACTONE) 25 MG tablet, TAKE 1 TABLET(25 MG) BY MOUTH DAILY, Disp: 90 tablet, Rfl: 1 .  triamterene-hydrochlorothiazide (DYAZIDE) 37.5-25 MG capsule, Take 1 capsule by mouth daily., Disp: , Rfl:  .  clotrimazole (LOTRIMIN) 1 % cream, Apply to nailbeds of both great toes and right 2nd digit once daily, Disp: 30 g, Rfl: 2 No Known Allergies Social History   Occupational History  . Occupation: Retired   Tobacco Use  . Smoking status: Never Smoker  . Smokeless tobacco: Never Used  Vaping Use  . Vaping Use: Never used  Substance and Sexual Activity  . Alcohol use: No  . Drug use: No  . Sexual activity: Never    Objective:   Constitutional Pt is a pleasant 86 y.o. African American female WD, WN in NAD.[name] AAO x 3.   Vascular Capillary refill time to digits immediate b/l. Palpable DP pulse(s) b/l lower extremities Faintly palpable PT pulse(s) b/l lower extremities. Pedal hair absent. Lower extremity skin temperature gradient within normal limits. No pain with  calf compression b/l. Evidence of chronic venous insufficiency b/l LE. No cyanosis or clubbing noted.  Neurologic Normal speech. Oriented to person, place, and time. Protective sensation intact 5/5 intact bilaterally with 10g monofilament b/l. Vibratory sensation intact b/l.  Dermatologic Pedal skin with normal turgor, texture and tone bilaterally. No open wounds bilaterally. No interdigital macerations bilaterally. Toenails 1-5 left, R hallux, R 3rd toe, R 4th toe and R 5th toe elongated, discolored, dystrophic, thickened, and crumbly with subungual debris and tenderness to dorsal palpation. Anonychia noted R 2nd toe. Nailbed(s) epithelialized.  There is noted onchyolysis of distal 1/2 nailplate of the bilateral great toes. The nailbed remains intact. There is no erythema, no edema, no drainage, no underlying flocculence.  Orthopedic: Normal muscle strength  5/5 to all lower extremity muscle groups bilaterally. No pain crepitus or joint limitation noted with ROM b/l. Hallux valgus with bunion deformity noted b/l lower extremities.   Radiographs: None Assessment:   1. Pain due to onychomycosis of toenails of both feet   2. Onycholysis of toenail    Plan:  Patient was evaluated and treated and all questions answered.  Onychomycosis with pain -Nails palliatively debridement as below. -Educated on self-care  Procedure: Nail Debridement Rationale: Pain Type of Debridement: manual, sharp debridement. Instrumentation: Nail nipper, rotary burr. Number of Nails: 9  -Examined patient. -Toenails 1-5 left, R hallux, R 3rd toe, R 4th toe and R 5th toe debrided in length and girth without iatrogenic bleeding with sterile nail nipper and dremel. For onycholysis of left hallux nailplate, toenail was debrided to level of adherence.  -Discontinue use of topical antifungal solution from [place] to b/l great toes and right 2nd digit. Rx written for Clotrimazole Cream 1% to be applied to exposed  nailbeds of bilateral great toes and right 2nd digit. -Patient to report any pedal injuries to medical professional immediately. -Patient to continue soft, supportive shoe gear daily. -Patient/POA to call should there be question/concern in the interim.  Return in about 3 months (around 11/26/2020).  Marzetta Board, DPM

## 2020-09-24 ENCOUNTER — Other Ambulatory Visit: Payer: Self-pay | Admitting: Gastroenterology

## 2020-09-24 DIAGNOSIS — R1084 Generalized abdominal pain: Secondary | ICD-10-CM

## 2020-09-24 DIAGNOSIS — R10816 Epigastric abdominal tenderness: Secondary | ICD-10-CM

## 2020-09-24 DIAGNOSIS — R634 Abnormal weight loss: Secondary | ICD-10-CM | POA: Diagnosis not present

## 2020-09-24 DIAGNOSIS — R197 Diarrhea, unspecified: Secondary | ICD-10-CM | POA: Diagnosis not present

## 2020-10-10 ENCOUNTER — Inpatient Hospital Stay: Admission: RE | Admit: 2020-10-10 | Payer: Medicare PPO | Source: Ambulatory Visit

## 2020-10-13 NOTE — Progress Notes (Signed)
I have reviewed and agreed above plan. 

## 2020-11-04 ENCOUNTER — Ambulatory Visit
Admission: RE | Admit: 2020-11-04 | Discharge: 2020-11-04 | Disposition: A | Discharge status: Home / Self Care | Payer: Medicare PPO | Source: Ambulatory Visit | Attending: Gastroenterology | Admitting: Gastroenterology

## 2020-11-04 DIAGNOSIS — K5730 Diverticulosis of large intestine without perforation or abscess without bleeding: Secondary | ICD-10-CM | POA: Diagnosis not present

## 2020-11-04 DIAGNOSIS — R10816 Epigastric abdominal tenderness: Secondary | ICD-10-CM

## 2020-11-04 DIAGNOSIS — R634 Abnormal weight loss: Secondary | ICD-10-CM | POA: Diagnosis not present

## 2020-11-04 DIAGNOSIS — R1084 Generalized abdominal pain: Secondary | ICD-10-CM

## 2020-11-04 DIAGNOSIS — K8020 Calculus of gallbladder without cholecystitis without obstruction: Secondary | ICD-10-CM | POA: Diagnosis not present

## 2020-11-04 DIAGNOSIS — Z853 Personal history of malignant neoplasm of breast: Secondary | ICD-10-CM | POA: Diagnosis not present

## 2020-11-04 MED ORDER — IOPAMIDOL (ISOVUE-300) INJECTION 61%
100.0000 mL | Freq: Once | INTRAVENOUS | Status: AC | PRN
  Administered 2020-11-04: 100 mL via INTRAVENOUS

## 2020-11-24 ENCOUNTER — Ambulatory Visit: Payer: Medicare PPO | Admitting: Cardiology

## 2020-11-26 ENCOUNTER — Ambulatory Visit (INDEPENDENT_AMBULATORY_CARE_PROVIDER_SITE_OTHER): Payer: Medicare PPO | Admitting: Podiatry

## 2020-11-26 ENCOUNTER — Other Ambulatory Visit: Payer: Self-pay

## 2020-11-26 ENCOUNTER — Encounter: Payer: Self-pay | Admitting: Podiatry

## 2020-11-26 DIAGNOSIS — I872 Venous insufficiency (chronic) (peripheral): Secondary | ICD-10-CM | POA: Insufficient documentation

## 2020-11-26 DIAGNOSIS — M2042 Other hammer toe(s) (acquired), left foot: Secondary | ICD-10-CM

## 2020-11-26 DIAGNOSIS — M79674 Pain in right toe(s): Secondary | ICD-10-CM | POA: Diagnosis not present

## 2020-11-26 DIAGNOSIS — R197 Diarrhea, unspecified: Secondary | ICD-10-CM | POA: Insufficient documentation

## 2020-11-26 DIAGNOSIS — D62 Acute posthemorrhagic anemia: Secondary | ICD-10-CM | POA: Insufficient documentation

## 2020-11-26 DIAGNOSIS — M2041 Other hammer toe(s) (acquired), right foot: Secondary | ICD-10-CM

## 2020-11-26 DIAGNOSIS — R10816 Epigastric abdominal tenderness: Secondary | ICD-10-CM | POA: Insufficient documentation

## 2020-11-26 DIAGNOSIS — R413 Other amnesia: Secondary | ICD-10-CM | POA: Insufficient documentation

## 2020-11-26 DIAGNOSIS — E669 Obesity, unspecified: Secondary | ICD-10-CM | POA: Insufficient documentation

## 2020-11-26 DIAGNOSIS — M2012 Hallux valgus (acquired), left foot: Secondary | ICD-10-CM

## 2020-11-26 DIAGNOSIS — B351 Tinea unguium: Secondary | ICD-10-CM | POA: Diagnosis not present

## 2020-11-26 DIAGNOSIS — R1084 Generalized abdominal pain: Secondary | ICD-10-CM | POA: Insufficient documentation

## 2020-11-26 DIAGNOSIS — G479 Sleep disorder, unspecified: Secondary | ICD-10-CM | POA: Insufficient documentation

## 2020-11-26 DIAGNOSIS — Z8542 Personal history of malignant neoplasm of other parts of uterus: Secondary | ICD-10-CM | POA: Insufficient documentation

## 2020-11-26 DIAGNOSIS — Z853 Personal history of malignant neoplasm of breast: Secondary | ICD-10-CM | POA: Insufficient documentation

## 2020-11-26 DIAGNOSIS — I83029 Varicose veins of left lower extremity with ulcer of unspecified site: Secondary | ICD-10-CM | POA: Insufficient documentation

## 2020-11-26 DIAGNOSIS — M79675 Pain in left toe(s): Secondary | ICD-10-CM | POA: Diagnosis not present

## 2020-11-26 DIAGNOSIS — M2011 Hallux valgus (acquired), right foot: Secondary | ICD-10-CM

## 2020-11-26 DIAGNOSIS — R634 Abnormal weight loss: Secondary | ICD-10-CM | POA: Insufficient documentation

## 2020-11-27 NOTE — Progress Notes (Signed)
Subjective:  Patient ID: [name], female    DOB: [birth year],  MRN: [id number]  87 y.o. female presents with painful thick toenails that are difficult to trim. Pain interferes with ambulation. Aggravating factors include wearing enclosed shoe gear. Pain is relieved with periodic professional debridement.   Daughter is present during today's visit. She states Mom has been complaining of pain in the left great toe. Denies any drainage redness or swelling.  Review of Systems: Negative except as noted in the HPI.  Past Medical History:  Diagnosis Date  . Anxiety   . Breast cancer (HCC)    left 1994, right 2007  . Diverticulosis   . Diverticulosis of colon with hemorrhage 06/19/2014  . DVT (deep venous thrombosis) (HCC) 1992   right leg x 2  . Endometrial adenocarcinoma (HCC) 12/25/12   biopsy  . Family history of malignant neoplasm of gastrointestinal tract   . GERD (gastroesophageal reflux disease)   . History of blood transfusion   . Hx of radiation therapy 02/2006 - 03/2006   right breast  . Hx of radiation therapy 5/12, 5/19, 5/29, 6/2, 04/30/2013   proximal vagina, 30 Gy in 5 sessions, HDR  . Hypertension   . Iron deficiency   . Memory loss   . Peripheral vascular disease (HCC)    leg wounds  . Personal history of radiation therapy   . Ulcers of both lower extremities (HCC)    chronic  . Venous stasis    bilateral   Past Surgical History:  Procedure Laterality Date  . ABDOMINAL HYSTERECTOMY  01/18/13   robotic, bso  . BREAST LUMPECTOMY Right   . COLONOSCOPY WITH PROPOFOL N/A 09/16/2017   Procedure: COLONOSCOPY WITH PROPOFOL;  Surgeon: Charlott Rakes, MD;  Location: [facility]COPY;  Service: Endoscopy;  Laterality: N/A;  Needs STAT CBC drawn before procedure  . MASTECTOMY Left 1994   left , Tamoxifen x 17 yrs  . partial mastectomy Right 2007   radiation   Patient Active Problem List   Diagnosis Date Noted  . Acute post-hemorrhagic anemia 11/26/2020  . Amnesia  11/26/2020  . Chronic venous insufficiency 11/26/2020  . Diarrhea 11/26/2020  . Epigastric abdominal tenderness 11/26/2020  . Generalized abdominal pain 11/26/2020  . Personal history of malignant neoplasm of breast 11/26/2020  . Personal history of malignant neoplasm of other parts of uterus 11/26/2020  . Obesity 11/26/2020  . Sleep disorder 11/26/2020  . Varicose veins of left lower extremity with ulcer of unspecified site (CODE) (HCC) 11/26/2020  . Weight loss 11/26/2020  . Bilateral primary osteoarthritis of knee 05/28/2020  . Dementia (HCC) 06/05/2019  . Gait abnormality 06/05/2019  . Alzheimer's disease, unspecified (CODE) (HCC) 06/05/2019  . Bilateral leg edema 01/01/2019  . Chronic combined systolic and diastolic CHF (congestive heart failure) (HCC) 12/29/2018  . History of deep venous thrombosis (DVT) of distal vein of right lower extremity 08/14/2018  . Lower GI bleed 09/05/2017  . Diverticulosis of colon with hemorrhage 06/19/2014  . Rectal bleed 06/18/2014  . Essential hypertension 06/18/2014  . Acute lower GI bleeding 06/18/2014  . Endometrial cancer (HCC) 12/20/2013  . Endometrial adenocarcinoma (HCC)   . Hx of radiation therapy   . Breast cancer (HCC)   . DCIS (ductal carcinoma in situ) of breast 11/05/2011  . Family history of malignant neoplasm of gastrointestinal tract 08/17/2011  . Odynophagia 08/17/2011    Current Outpatient Medications:  .  acetaminophen (TYLENOL) 325 MG tablet, Take 650 mg by mouth every 6 (six) hours as  needed., Disp: , Rfl:  .  beclomethasone (QVAR) 80 MCG/ACT inhaler, Inhale into the lungs 2 (two) times daily., Disp: , Rfl:  .  calcium carbonate (OS-CAL - DOSED IN MG OF ELEMENTAL CALCIUM) 1250 (500 Ca) MG tablet, Take 1 tablet by mouth., Disp: , Rfl:  .  cetirizine (ZYRTEC) 10 MG tablet, Take 10 mg by mouth daily., Disp: , Rfl:  .  Cholecalciferol (VITAMIN D3) 1000 UNITS CAPS, Take 1,000 Units by mouth daily., Disp: , Rfl:  .  citalopram  (CELEXA) 20 MG tablet, TK 1 T PO QD, Disp: , Rfl:  .  clotrimazole (LOTRIMIN) 1 % cream, Apply to nailbeds of both great toes and right 2nd digit once daily, Disp: 30 g, Rfl: 2 .  docusate sodium (COLACE) 100 MG capsule, Take 100 mg by mouth 2 (two) times daily., Disp: , Rfl:  .  ENTRESTO 24-26 MG, TAKE 1 TABLET BY MOUTH TWICE DAILY, Disp: 180 tablet, Rfl: 3 .  EPINEPHrine (ADRENALIN) 0.1 % nasal solution, Place 1 drop into the nose once., Disp: , Rfl:  .  ferrous sulfate 325 (65 FE) MG tablet, Take 1 tablet (325 mg total) by mouth 2 (two) times daily with a meal. (Patient taking differently: Take 325 mg by mouth daily.), Disp: 60 tablet, Rfl: 5 .  fluticasone (FLOVENT HFA) 110 MCG/ACT inhaler, Inhale into the lungs 2 (two) times daily., Disp: , Rfl:  .  furosemide (LASIX) 20 MG tablet, TAKE 1 TABLET(20 MG) BY MOUTH DAILY AS NEEDED, Disp: 90 tablet, Rfl: 1 .  Magnesium 250 MG TABS, Take 1 tablet by mouth daily., Disp: , Rfl:  .  Multiple Vitamins-Minerals (CENTRUM SILVER PO), Take 1 tablet by mouth daily., Disp: , Rfl:  .  NONFORMULARY OR COMPOUNDED ITEM, Antifungal solution: Terbinafine 3%, Fluconazole 2%, Tea Tree Oil 5%, Urea 10%, Ibuprofen 2% in DMSO suspension #49mL, Disp: 1 each, Rfl: 3 .  Omega-3 Fatty Acids (FISH OIL) 1000 MG CAPS, Take 1,200 mg by mouth daily., Disp: , Rfl:  .  omeprazole (PRILOSEC) 40 MG capsule, Take 40 mg by mouth daily., Disp: , Rfl:  .  QUEtiapine (SEROQUEL) 25 MG tablet, TAKE 2 TABLETS(50 MG) BY MOUTH AT BEDTIME, Disp: 180 tablet, Rfl: 3 .  spironolactone (ALDACTONE) 25 MG tablet, TAKE 1 TABLET(25 MG) BY MOUTH DAILY, Disp: 90 tablet, Rfl: 1 .  triamterene-hydrochlorothiazide (DYAZIDE) 37.5-25 MG capsule, Take 1 capsule by mouth daily., Disp: , Rfl:  No Known Allergies Social History   Occupational History  . Occupation: Retired   Tobacco Use  . Smoking status: Never Smoker  . Smokeless tobacco: Never Used  Vaping Use  . Vaping Use: Never used  Substance and  Sexual Activity  . Alcohol use: No  . Drug use: No  . Sexual activity: Never    Objective:   Constitutional Pt is a pleasant 87 y.o. African American female WD, WN in NAD.[name] AAO x 3.   Vascular Capillary refill time to digits immediate b/l. Palpable DP pulse(s) b/l lower extremities Faintly palpable PT pulse(s) b/l lower extremities. Pedal hair absent. Lower extremity skin temperature gradient within normal limits. No pain with calf compression b/l. Evidence of chronic venous insufficiency b/l LE. No cyanosis or clubbing noted.  Neurologic Normal speech. Oriented to person, place, and time. Protective sensation intact 5/5 intact bilaterally with 10g monofilament b/l. Vibratory sensation intact b/l.  Dermatologic Pedal skin with normal turgor, texture and tone bilaterally. No open wounds bilaterally. No interdigital macerations bilaterally. Toenails 1-5 left, R hallux,  R 3rd toe, R 4th toe and R 5th toe elongated, discolored, dystrophic, thickened, and crumbly with subungual debris and tenderness to dorsal palpation. Anonychia noted R 2nd toe. Nailbed(s) epithelialized.   Orthopedic: Normal muscle strength 5/5 to all lower extremity muscle groups bilaterally. No pain crepitus or joint limitation noted with ROM b/l. Hallux valgus with bunion deformity noted b/l lower extremities.   Radiographs: None Assessment:   1. Pain due to onychomycosis of toenails of both feet   2. Hallux valgus, acquired, bilateral   3. Acquired hammertoes of both feet    Plan:  Patient was evaluated and treated and all questions answered.  Onychomycosis with pain -Nails palliatively debridement as below. -Educated on self-care  Procedure: Nail Debridement Rationale: Pain Type of Debridement: manual, sharp debridement. Instrumentation: Nail nipper, rotary burr. Number of Nails: 9  -Examined patient. -Toenails 1-5 left, R hallux, R 3rd toe, R 4th toe and R 5th toe debrided in length and girth without iatrogenic  bleeding with sterile nail nipper and dremel. For onycholysis of left hallux nailplate, toenail was debrided to level of adherence.  -Dispensed foam toe separators for daily use. -Patient to report any pedal injuries to medical professional immediately. -Patient to continue soft, supportive shoe gear daily. -Patient/POA to call should there be question/concern in the interim.  Return in about 3 months (around 02/24/2021).  Freddie Breech, DPM

## 2020-11-28 ENCOUNTER — Ambulatory Visit: Payer: Medicare PPO | Admitting: Cardiology

## 2020-11-28 ENCOUNTER — Encounter: Payer: Self-pay | Admitting: Cardiology

## 2020-11-28 ENCOUNTER — Other Ambulatory Visit: Payer: Self-pay

## 2020-11-28 VITALS — BP 162/78 | HR 63 | Ht 66.0 in | Wt 177.0 lb

## 2020-11-28 DIAGNOSIS — Z86718 Personal history of other venous thrombosis and embolism: Secondary | ICD-10-CM

## 2020-11-28 DIAGNOSIS — I872 Venous insufficiency (chronic) (peripheral): Secondary | ICD-10-CM | POA: Diagnosis not present

## 2020-11-28 DIAGNOSIS — R600 Localized edema: Secondary | ICD-10-CM | POA: Diagnosis not present

## 2020-11-28 DIAGNOSIS — I10 Essential (primary) hypertension: Secondary | ICD-10-CM

## 2020-11-28 DIAGNOSIS — I5032 Chronic diastolic (congestive) heart failure: Secondary | ICD-10-CM | POA: Diagnosis not present

## 2020-11-28 NOTE — Progress Notes (Signed)
[name] Date of Birth: [birth year] MRN: [id number] [place], PA-C Former Cardiology Providers: Jeri Lager, APRN, FNP-C Primary Cardiologist: Rex Kras, DO, [facility] (established care May 23, 2020)  Date: 11/28/20 Last Office Visit: 05/2020  Chief Complaint  Patient presents with  . Heart Failure    HPI  [name] is a 87 y.o.  female who presents to the office with a chief complaint of " 27-monthfollow-up for heart failure management."Patient's past medical history and cardiovascular risk factors include: Chronic heart failure with preserved EF, stage B, NYHA class II, history of recovered cardiomyopathy, history of DVT, GERD, history of GI bleed due to diverticulosis, chronic anemia, hypertension, chronic venous stasis, history of uterine cancer status post radiation therapy in 2016, history of breast cancer status postmastectomy and radiation therapy.  Patient presents to the office accompanied by her daughter [name]  Patient is being followed for the management of congestive heart failure.  She was last seen in the office back in July 2021.    Since last office visit patient is doing well from a cardiovascular standpoint.  She has not had any hospitalizations for cardiovascular symptoms.  She denies any chest pain or shortness of breath at rest or with effort related activities.  She is compliant with her medications which were reconciled during today's encounter.  She had diarrhea and has undergone extensive evaluation with gastroenterology as per the patient's daughter since last office visit.  She recently had blood work done at outside facility which was reviewed via care everywhere and noted below for further reference.  Her blood pressure is elevated at today's encounter because the patient has not taken her antihypertensive medications or heart failure medications since morning.  FUNCTIONAL STATUS: Walks with cane and walker.     ALLERGIES: No Known Allergies   MEDICATION LIST PRIOR TO VISIT: Current Outpatient Medications on File Prior to Visit  Medication Sig Dispense Refill  . acetaminophen (TYLENOL) 325 MG tablet Take 650 mg by mouth every 6 (six) hours as needed.    . calcium carbonate (OS-CAL - DOSED IN MG OF ELEMENTAL CALCIUM) 1250 (500 Ca) MG tablet Take 1 tablet by mouth.    . Cholecalciferol (VITAMIN D3) 1000 UNITS CAPS Take 1,000 Units by mouth daily.    . citalopram (CELEXA) 20 MG tablet TK 1 T PO QD    . clotrimazole (LOTRIMIN) 1 % cream Apply to nailbeds of both great toes and right 2nd digit once daily 30 g 2  . ENTRESTO 24-26 MG TAKE 1 TABLET BY MOUTH TWICE DAILY 180 tablet 3  . furosemide (LASIX) 20 MG tablet TAKE 1 TABLET(20 MG) BY MOUTH DAILY AS NEEDED 90 tablet 1  . Multiple Vitamins-Minerals (CENTRUM SILVER PO) Take 1 tablet by mouth daily.    . NONFORMULARY OR COMPOUNDED ITEM Antifungal solution: Terbinafine 3%, Fluconazole 2%, Tea Tree Oil 5%, Urea 10%, Ibuprofen 2% in DMSO suspension #[id number] each 3  . Omega-3 Fatty Acids (FISH OIL) 1000 MG CAPS Take 1,200 mg by mouth daily.    . QUEtiapine (SEROQUEL) 25 MG tablet TAKE 2 TABLETS(50 MG) BY MOUTH AT BEDTIME 180 tablet 3  . spironolactone (ALDACTONE) 25 MG tablet TAKE 1 TABLET(25 MG) BY MOUTH DAILY 90 tablet 1   No current facility-administered medications on file prior to visit.    PAST MEDICAL HISTORY: Past Medical History:  Diagnosis Date  . Anxiety   . [place]   left [place], right 2007  . Diverticulosis   .  Diverticulosis of colon with hemorrhage 06/19/2014  . DVT (deep venous thrombosis) ([place]) 1992   right leg x 2  . Endometrial adenocarcinoma ([place]) 12/25/12   biopsy  . Family history of malignant neoplasm of gastrointestinal tract   . GERD (gastroesophageal reflux disease)   . History of blood transfusion   . Hx of radiation therapy 02/2006 - 03/2006   right breast  . Hx of radiation therapy 5/12, 5/19, 5/29, 6/2,  04/30/2013   proximal vagina, 30 Gy in 5 sessions, HDR  . Hypertension   . Iron deficiency   . Memory loss   . Peripheral vascular disease (HCC)    leg wounds  . Personal history of radiation therapy   . Ulcers of both lower extremities (HCC)    chronic  . Venous stasis    bilateral    PAST SURGICAL HISTORY: Past Surgical History:  Procedure Laterality Date  . ABDOMINAL HYSTERECTOMY  01/18/13   robotic, bso  . BREAST LUMPECTOMY Right   . COLONOSCOPY WITH PROPOFOL N/A 09/16/2017   Procedure: COLONOSCOPY WITH PROPOFOL;  Surgeon: Wilford Corner, MD;  Location: [place];  Service: Endoscopy;  Laterality: N/A;  Needs STAT CBC drawn before procedure  . MASTECTOMY Left 1994   left , Tamoxifen x 17 yrs  . partial mastectomy Right 2007   radiation    FAMILY HISTORY: The patient's family history includes Anuerysm in her sister; Breast cancer in her daughter and sister; Cancer in her brother and father; Colon cancer in her sister; Heart disease in her sister; Other in her mother; Prostate cancer in her brother.   SOCIAL HISTORY:  The patient  reports that she has never smoked. She has never used smokeless tobacco. She reports that she does not drink alcohol and does not use drugs.  Review of Systems  Constitutional: Negative for chills and fever.  HENT: Negative for hoarse voice and nosebleeds.   Eyes: Negative for discharge, double vision and pain.  Cardiovascular: Positive for leg swelling (chronic and stable. ). Negative for chest pain, claudication, dyspnea on exertion, near-syncope, orthopnea, palpitations, paroxysmal nocturnal dyspnea and syncope.  Respiratory: Negative for hemoptysis and shortness of breath.   Musculoskeletal: Negative for muscle cramps and myalgias.  Gastrointestinal: Negative for abdominal pain, constipation, diarrhea, hematemesis, hematochezia, melena, nausea and vomiting.  Neurological: Negative for dizziness and light-headedness.    PHYSICAL  EXAM: Vitals with BMI 11/28/2020 07/08/2020 06/11/2020  Height '5\' 6"'  '5\' 6"'  '5\' 6"'   Weight 177 lbs 181 lbs 181 lbs  BMI 28.58 58.52 77.82  Systolic 423 536 -  Diastolic 78 71 -  Pulse 63 65 -    CONSTITUTIONAL: Age-appropriate female, hemodynamically stable, no acute distress.   SKIN: Skin is warm and dry. No rash noted. No cyanosis. No pallor. No jaundice HEAD: Normocephalic and atraumatic.  EYES: No scleral icterus MOUTH/THROAT: Moist oral membranes.  NECK: No JVD present. No thyromegaly noted. No carotid bruits  LYMPHATIC: No visible cervical adenopathy.  CHEST Normal respiratory effort. No intercostal retractions  LUNGS: Clear to auscultation bilaterally.  No stridor. No wheezes. No rales.  CARDIOVASCULAR: Regular, positive R4-E3, soft holosystolic murmur heard at the apex, no gallops or rubs. ABDOMINAL: No apparent ascites.  EXTREMITIES: No peripheral edema, wearing bilateral compression stockings up to the knee. HEMATOLOGIC: No significant bruising NEUROLOGIC: Oriented to person, place, and time. Nonfocal. Normal muscle tone.  PSYCHIATRIC: Normal mood and affect. Normal behavior. Cooperative  CARDIAC DATABASE: EKG: 11/28/2020: Normal sinus rhythm, 60 bpm, poor R wave progression, consider  old anteroseptal infarct, without underlying injury pattern.  Echocardiogram: 08/22/2018: LVEF 44%, moderate LVH, mildly decreased global wall motion, grade 1 diastolic impairment.  Trace MR, moderate TR, RVSP 40 mmHg.  07/02/2019: LVEF 07%, grade 1 diastolic impairment, mild biatrial dilatation, trace AR, mild MR, moderate TR, PASP 44 mmHg.  Stress Testing:  None  Heart Catheterization: None   LABORATORY DATA: CBC Latest Ref Rng & Units 12/20/2018 08/08/2018 06/19/2018  WBC 4.0 - 10.5 K/uL 5.2 5.1 6.3  Hemoglobin 12.0 - 15.0 g/dL 10.5(L) 10.2(L) 10.8(L)  Hematocrit 36.0 - 46.0 % 32.0(L) 30.8(L) 32.4(L)  Platelets 150 - 400 K/uL 191 218 216    CMP Latest Ref Rng & Units 01/15/2019 12/20/2018  08/08/2018  Glucose 65 - 99 mg/dL 84 89 83  BUN 10 - 36 mg/dL '19 23 19  ' Creatinine 0.57 - 1.00 mg/dL 1.06(H) 1.08(H) 0.93  Sodium 134 - 144 mmol/L 143 141 143  Potassium 3.5 - 5.2 mmol/L 5.0 4.3 4.2  Chloride 96 - 106 mmol/L 103 103 105  CO2 20 - 29 mmol/L 19(L) 31 30  Calcium 8.7 - 10.3 mg/dL 9.8 9.7 9.3  Total Protein 6.5 - 8.1 g/dL - 6.8 6.5  Total Bilirubin 0.3 - 1.2 mg/dL - 0.4 0.8  Alkaline Phos 38 - 126 U/L - 62 51  AST 15 - 41 U/L - 12(L) 17  ALT 0 - 44 U/L - 7 16    Lipid Panel  No results found for: CHOL, TRIG, HDL, CHOLHDL, VLDL, LDLCALC, LDLDIRECT, LABVLDL  No results found for: HGBA1C No components found for: NTPROBNP No results found for: TSH  Cardiac Panel (last 3 results) No results for input(s): CKTOTAL, CKMB, TROPONINIHS, RELINDX in the last 72 hours.   External Labs: Collected: 09/24/2020 from care everywhere Creatinine 1.26 mg/dL. eGFR: 48 mL/min per 1.73 m Potassium 4.8 TSH 2.31 Magnesium 2.3  Collected: 08/20/2020 Serum creatinine 1.26 mg/dL. GFR 48 Lipid profile: Total cholesterol 161, triglycerides 54, HDL 56, LDL 94  IMPRESSION:    ICD-10-CM   1. Chronic diastolic heart failure (HCC)  I50.32 EKG 12-Lead  2. Essential hypertension  I10   3. History of deep venous thrombosis (DVT) of distal vein of right lower extremity  Z86.718   4. Chronic venous stasis dermatitis of both lower extremities  I87.2   5. Bilateral leg edema  R60.0      RECOMMENDATIONS: Dalisa Forrer is a 87 y.o. female whose past medical history and cardiovascular risk factors include: Chronic heart failure with preserved EF, stage B, NYHA class II, history of recovered cardiomyopathy, history of DVT, GERD, history of GI bleed due to diverticulosis, chronic anemia, hypertension, chronic venous stasis, history of uterine cancer status post radiation therapy in 2016, history of breast cancer status postmastectomy and radiation therapy.  Chronic heart failure with preserved  EF, stage B, NYHA class II:  Patient is currently euvolemic.  And no recent hospitalizations for heart failure exacerbation.  Continue Entresto, Lasix, spironolactone.  Recommended initiation of carvedilol given her history of recovered cardiomyopathy, heart failure with preserved EF, and elevated blood pressures.  However, patient's daughter wants to hold off on additional medications at this time.  She attributes her elevated blood pressures to not taking her morning medications prior to coming to the office.  She is asked to call the office if her systolic blood pressures are consistently greater than 130-140 mmHg.  Independently reviewed the most recent blood work from outside facility found via [place]  Recovered cardiomyopathy: Educated on importance  of guideline directed medical therapy and improving her modifiable cardiovascular risk factors.  See above  History of DVT: Currently not on oral anticoagulation, managed by primary team  Benign essential hypertension: Her daughter states that home blood pressures are well controlled.  Continue current medical therapy.  Low-salt diet recommended.  Currently managed by primary care provider.  Chronic venous stasis: Patient's daughter would like to have another prescription for compression stockings.   FINAL MEDICATION LIST END OF ENCOUNTER: No orders of the defined types were placed in this encounter.    Current Outpatient Medications:  .  acetaminophen (TYLENOL) 325 MG tablet, Take 650 mg by mouth every 6 (six) hours as needed., Disp: , Rfl:  .  calcium carbonate (OS-CAL - DOSED IN MG OF ELEMENTAL CALCIUM) 1250 (500 Ca) MG tablet, Take 1 tablet by mouth., Disp: , Rfl:  .  Cholecalciferol (VITAMIN D3) 1000 UNITS CAPS, Take 1,000 Units by mouth daily., Disp: , Rfl:  .  citalopram (CELEXA) 20 MG tablet, TK 1 T PO QD, Disp: , Rfl:  .  clotrimazole (LOTRIMIN) 1 % cream, Apply to nailbeds of both great toes and right 2nd digit once  daily, Disp: 30 g, Rfl: 2 .  ENTRESTO 24-26 MG, TAKE 1 TABLET BY MOUTH TWICE DAILY, Disp: 180 tablet, Rfl: 3 .  furosemide (LASIX) 20 MG tablet, TAKE 1 TABLET(20 MG) BY MOUTH DAILY AS NEEDED, Disp: 90 tablet, Rfl: 1 .  Multiple Vitamins-Minerals (CENTRUM SILVER PO), Take 1 tablet by mouth daily., Disp: , Rfl:  .  NONFORMULARY OR COMPOUNDED ITEM, Antifungal solution: Terbinafine 3%, Fluconazole 2%, Tea Tree Oil 5%, Urea 10%, Ibuprofen 2% in DMSO suspension #2m, Disp: 1 each, Rfl: 3 .  Omega-3 Fatty Acids (FISH OIL) 1000 MG CAPS, Take 1,200 mg by mouth daily., Disp: , Rfl:  .  QUEtiapine (SEROQUEL) 25 MG tablet, TAKE 2 TABLETS(50 MG) BY MOUTH AT BEDTIME, Disp: 180 tablet, Rfl: 3 .  spironolactone (ALDACTONE) 25 MG tablet, TAKE 1 TABLET(25 MG) BY MOUTH DAILY, Disp: 90 tablet, Rfl: 1  Orders Placed This Encounter  Procedures  . EKG 12-Lead   Total time spent 30 minutes:   --Continue cardiac medications as reconciled in final medication list. --Return in about 6 months (around 05/28/2021) for Follow up, heart failure management.. Or sooner if needed. --Continue follow-up with your primary care physician regarding the management of your other chronic comorbid conditions.  Patient's questions and concerns were addressed to her satisfaction. She voices understanding of the instructions provided during this encounter.   This note was created using a voice recognition software as a result there may be grammatical errors inadvertently enclosed that do not reflect the nature of this encounter. Every attempt is made to correct such errors.  SRex Kras DNevada FSouth [facility] Pager: [phone]: [phone]

## 2020-12-01 ENCOUNTER — Telehealth: Payer: Self-pay

## 2020-12-01 DIAGNOSIS — R634 Abnormal weight loss: Secondary | ICD-10-CM | POA: Diagnosis not present

## 2020-12-01 DIAGNOSIS — R197 Diarrhea, unspecified: Secondary | ICD-10-CM | POA: Diagnosis not present

## 2020-12-01 NOTE — Telephone Encounter (Signed)
Pt's daughter called and her mom's BP is running high today 163/69 this was also high at her doctor appt today. She said you told her to let you know if it runs high.

## 2020-12-03 NOTE — Telephone Encounter (Signed)
Spoke to her daughter, [name]. Patient's blood pressures at home have been ranging between 130-140 mmHg with the exceptions of the days when she has to see a physician her blood pressures are elevated.  At the age of 45 recommended to keep her blood pressure around 130-140 mmHg.  If she starts seeing a trend upward at home she will call the office for further recommendations.

## 2020-12-26 IMAGING — CT CT ABD-PELV W/ CM
2 of 5 series · 11 of 46 positions shown, 12 images · IV contrast (iopamidol)
Comparison: 09/05/2017

CLINICAL DATA: Epigastric pain and tenderness. Unintentional 25 lb
weight loss in past 6 months. Personal history of breast carcinoma
and endometrial carcinoma.

EXAM:
CT ABDOMEN AND PELVIS WITH CONTRAST
TECHNIQUE: Multidetector CT imaging of the abdomen and pelvis was performed
using the standard protocol following bolus administration of
intravenous contrast.
CONTRAST:  100mL [id number] IOPAMIDOL ([id number]) INJECTION 61%

[Series 2: abd pelvis 5.00 br40 s3 axial · axial · 0.53mm/px · z∈[+1291,+1631]mm · 8 of 86 slices shown, 9 images]
[im 9/86  soft-tissue]
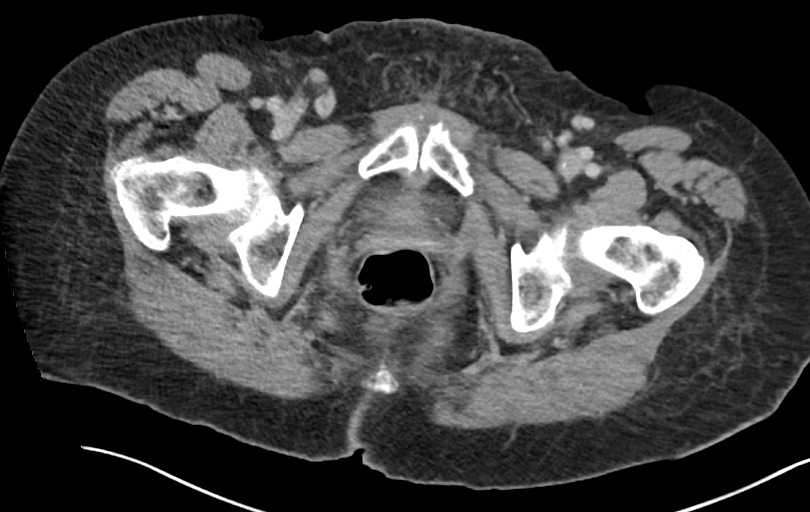
[im 9/86  bone]
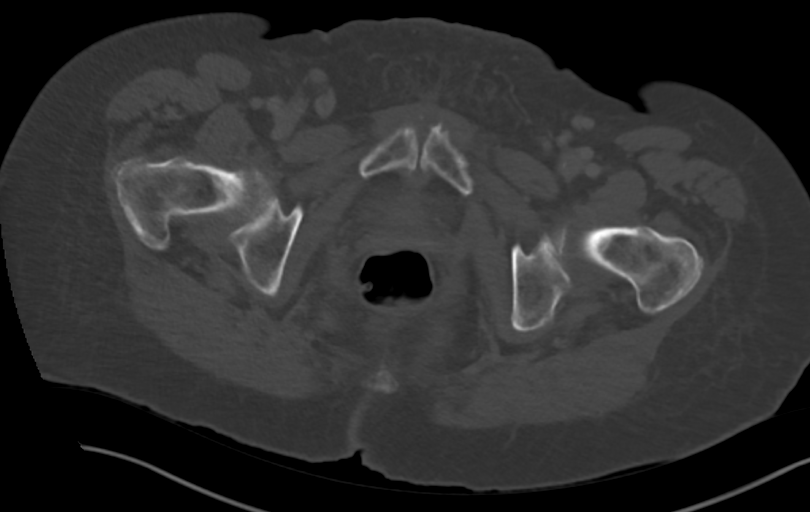
[im 17/86  soft-tissue]
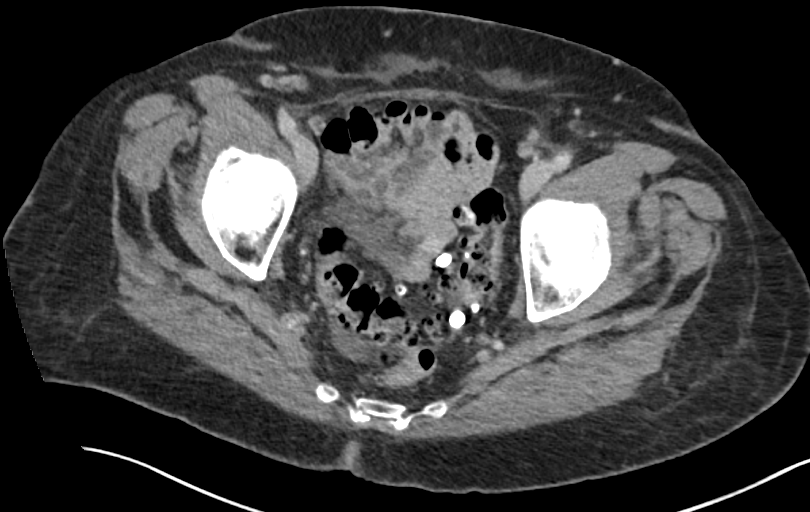
[im 29/86  soft-tissue]
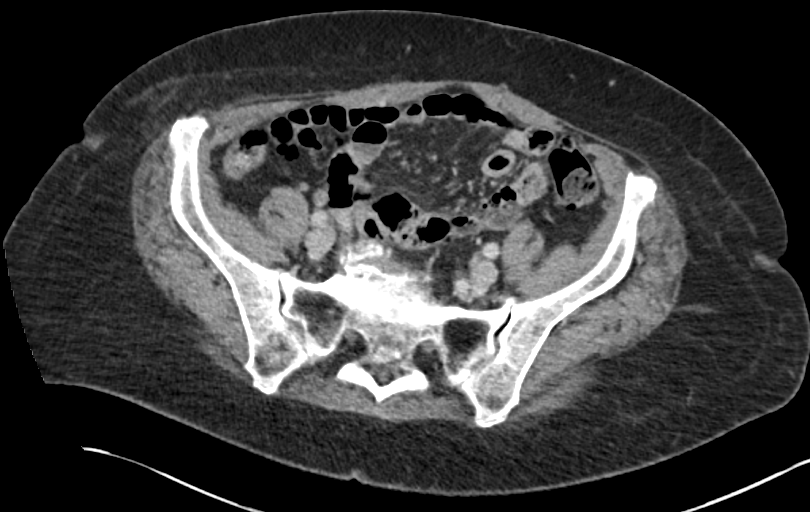
[im 37/86  soft-tissue]
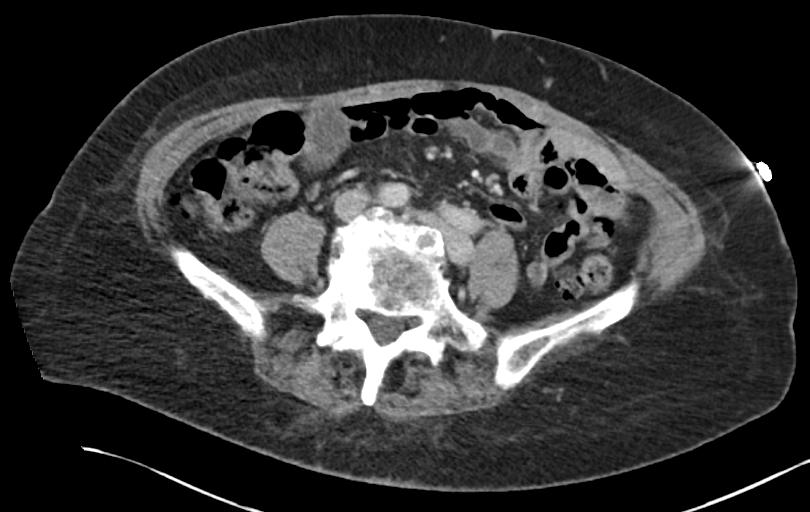
[im 49/86  soft-tissue]
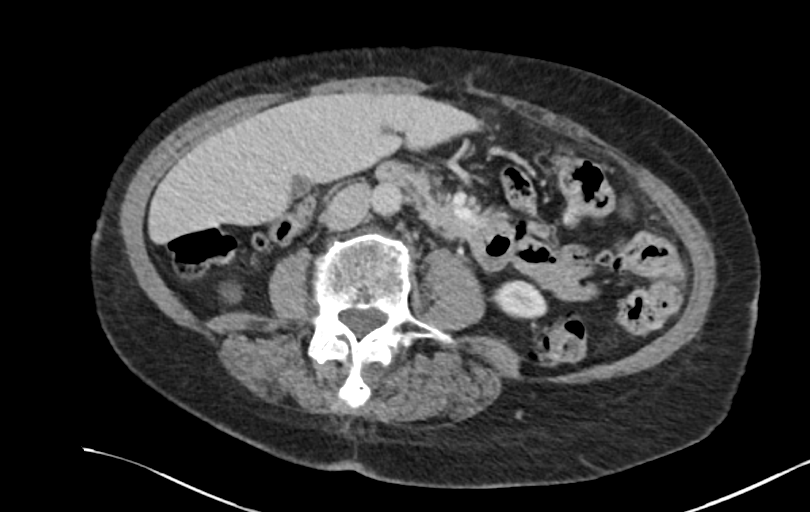
[im 57/86  soft-tissue]
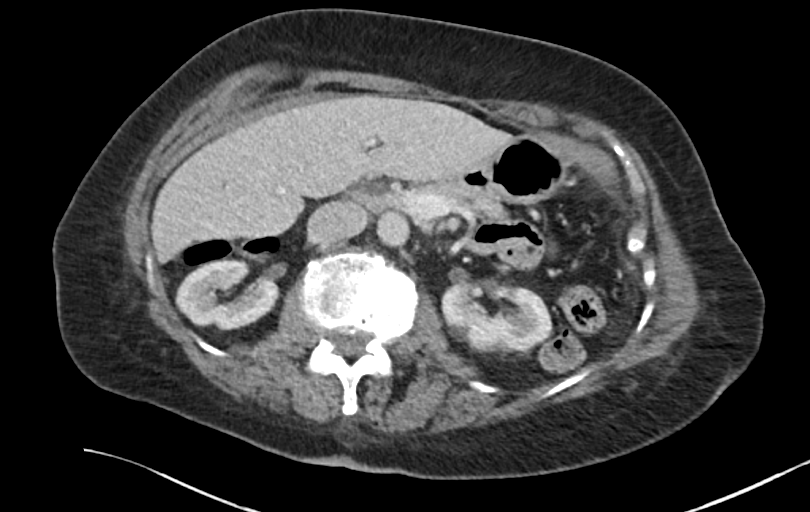
[im 69/86  soft-tissue]
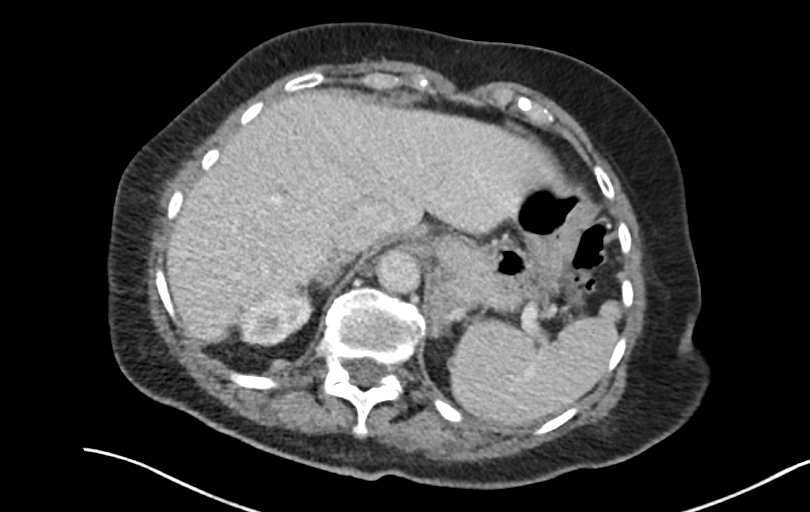
[im 77/86  soft-tissue]
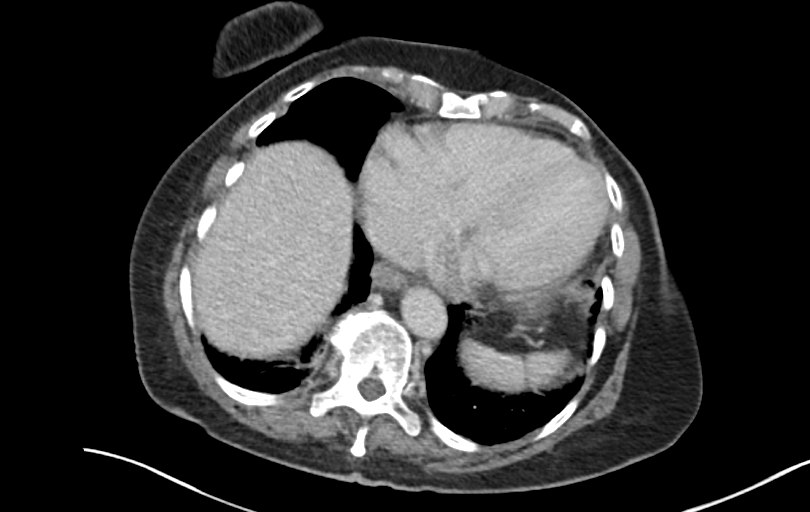

[Series 6: abd pelvis 2.00 br40 s3 cor · coronal · 0.83mm/px · 3 of 128 slices shown]
[im 43/128  soft-tissue]
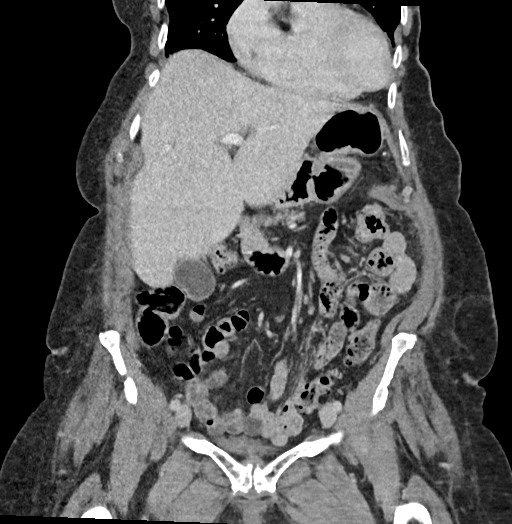
[im 57/128  soft-tissue]
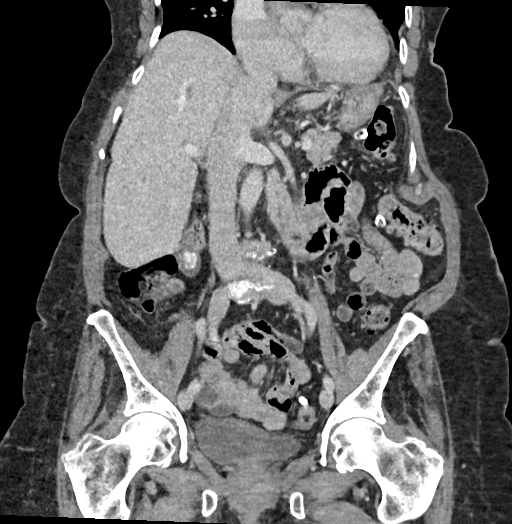
[im 71/128  soft-tissue]
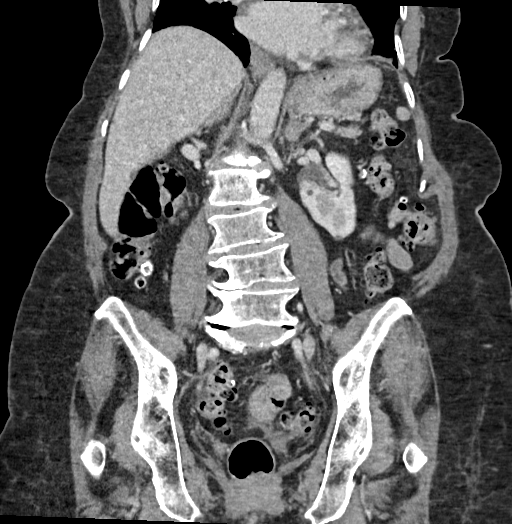

[11 of 46 positions shown; findings below may reference images not displayed]

FINDINGS: Lower Chest: No acute findings.

Hepatobiliary: No hepatic masses identified. Several calcified
gallstones are seen, largest measuring 1.8 cm. No evidence of
cholecystitis or biliary ductal dilatation.

Pancreas:  No mass or inflammatory changes.

Spleen: Within normal limits in size and appearance.

Adrenals/Urinary Tract: Multiple small cysts are again seen in both
kidneys. No masses identified. No evidence of ureteral calculi or
hydronephrosis.

Stomach/Bowel: No evidence of obstruction, inflammatory process or
abnormal fluid collections. Diffuse colonic diverticulosis is again
seen, however there is no evidence of diverticulitis. Normal
appendix visualized.

Vascular/Lymphatic: No pathologically enlarged lymph nodes. No
abdominal aortic aneurysm.

Reproductive: Prior hysterectomy. Adnexal regions are unremarkable.
Tiny amount of free fluid is seen in pelvic cul-de-sac which is
nonspecific.

Other:  None.

Musculoskeletal:  No suspicious bone lesions identified.
IMPRESSION: No acute findings.  No evidence of recurrent or metastatic disease.

Cholelithiasis. No radiographic evidence of cholecystitis.

Colonic diverticulosis, without radiographic evidence of
diverticulitis.

## 2021-01-22 DIAGNOSIS — M25551 Pain in right hip: Secondary | ICD-10-CM | POA: Diagnosis not present

## 2021-01-22 DIAGNOSIS — W19XXXA Unspecified fall, initial encounter: Secondary | ICD-10-CM | POA: Diagnosis not present

## 2021-01-28 DIAGNOSIS — R519 Headache, unspecified: Secondary | ICD-10-CM | POA: Diagnosis not present

## 2021-02-25 ENCOUNTER — Encounter: Payer: Self-pay | Admitting: Podiatry

## 2021-02-25 ENCOUNTER — Other Ambulatory Visit: Payer: Self-pay

## 2021-02-25 ENCOUNTER — Ambulatory Visit: Payer: Medicare PPO | Admitting: Podiatry

## 2021-02-25 DIAGNOSIS — M2041 Other hammer toe(s) (acquired), right foot: Secondary | ICD-10-CM

## 2021-02-25 DIAGNOSIS — M79675 Pain in left toe(s): Secondary | ICD-10-CM | POA: Diagnosis not present

## 2021-02-25 DIAGNOSIS — L84 Corns and callosities: Secondary | ICD-10-CM | POA: Diagnosis not present

## 2021-02-25 DIAGNOSIS — M79674 Pain in right toe(s): Secondary | ICD-10-CM

## 2021-02-25 DIAGNOSIS — I739 Peripheral vascular disease, unspecified: Secondary | ICD-10-CM | POA: Diagnosis not present

## 2021-02-25 DIAGNOSIS — M2011 Hallux valgus (acquired), right foot: Secondary | ICD-10-CM

## 2021-02-25 DIAGNOSIS — B351 Tinea unguium: Secondary | ICD-10-CM | POA: Diagnosis not present

## 2021-02-25 DIAGNOSIS — M2012 Hallux valgus (acquired), left foot: Secondary | ICD-10-CM

## 2021-02-25 DIAGNOSIS — M2042 Other hammer toe(s) (acquired), left foot: Secondary | ICD-10-CM

## 2021-03-01 NOTE — Progress Notes (Signed)
  Subjective:  Patient ID: [name], female    DOB: March 07, 1925,  MRN: [id number]  87 y.o. female presents with painful thick toenails that are difficult to trim. Pain interferes with ambulation. Aggravating factors include wearing enclosed shoe gear. Pain is relieved with periodic professional debridement.   Daughter is present during today's visit. She states Mom has been complaining of pain in the left great toe. Denies any drainage redness or swelling.  Review of Systems: Negative except as noted in the HPI.   No Known Allergies    Objective:   Constitutional Pt is a pleasant 87 y.o. African American female WD, WN in NAD.[name] AAO x 3.   Vascular Capillary refill time to digits immediate b/l. Palpable DP pulse(s) b/l lower extremities Nonpalpable PT pulse(s) b/l lower extremities. Pedal hair absent. Lower extremity skin temperature gradient within normal limits. No pain with calf compression b/l. Evidence of chronic venous insufficiency b/l lower extremities. No cyanosis or clubbing noted.  Neurologic Normal speech. Oriented to person, place, and time. Protective sensation intact 5/5 intact bilaterally with 10g monofilament b/l. Vibratory sensation intact b/l.  Dermatologic Pedal skin thin, shiny and atrophic b/l. No open wounds bilaterally. No interdigital macerations bilaterally. Toenails 1-5 left, R hallux, R 3rd toe, R 4th toe and R 5th toe elongated, discolored, dystrophic, thickened, and crumbly with subungual debris and tenderness to dorsal palpation. Anonychia noted R 2nd toe. Nailbed(s) epithelialized.  Hyperkeratotic lesion(s) dorsal aspect left 2nd toe PIPJ, L hallux and medial aspect L 2nd toe.  No erythema, no edema, no drainage, no fluctuance.  Orthopedic: Normal muscle strength 5/5 to all lower extremity muscle groups bilaterally. No pain crepitus or joint limitation noted with ROM b/l. Hallux valgus with bunion deformity noted b/l lower extremities.   Radiographs:  None Assessment:   1. Pain due to onychomycosis of toenails of both feet   2. Corns   3. Hallux valgus, acquired, bilateral   4. Acquired hammertoes of both feet   5. PAD (peripheral artery disease) ([place])    Plan:  Patient was evaluated and treated and all questions answered.  Onychomycosis with pain -Nails palliatively debridement as below. -Educated on self-care  Procedure: Nail Debridement Rationale: Pain Type of Debridement: manual, sharp debridement. Instrumentation: Nail nipper, rotary burr. Number of Nails: 9  -Examined patient. -Toenails 1-5 left, R hallux, R 3rd toe, R 4th toe and R 5th toe debrided in length and girth without iatrogenic bleeding with sterile nail nipper and dremel. For onycholysis of left hallux nailplate, toenail was debrided to level of adherence.  -Corn(s) left great toe, dorsal 2nd PIPJ left and medial 2nd digit left foot pared utilizing sterile scalpel blade without incident. -Patient to report any pedal injuries to medical professional immediately. -Patient to continue soft, supportive shoe gear daily. -Patient/POA to call should there be question/concern in the interim.  Return in about 3 months (around 05/27/2021) for nail trim.  Marzetta Board, DPM

## 2021-03-06 ENCOUNTER — Other Ambulatory Visit: Payer: Self-pay | Admitting: Cardiology

## 2021-03-06 DIAGNOSIS — I5042 Chronic combined systolic (congestive) and diastolic (congestive) heart failure: Secondary | ICD-10-CM

## 2021-03-10 ENCOUNTER — Other Ambulatory Visit: Payer: Self-pay

## 2021-03-10 DIAGNOSIS — I5042 Chronic combined systolic (congestive) and diastolic (congestive) heart failure: Secondary | ICD-10-CM

## 2021-03-10 MED ORDER — FUROSEMIDE 20 MG PO TABS: 20.0000 mg | ORAL_TABLET | ORAL | 1 refills | Status: DC | PRN

## 2021-04-04 ENCOUNTER — Other Ambulatory Visit: Payer: Self-pay | Admitting: Cardiology

## 2021-05-11 ENCOUNTER — Telehealth: Payer: Medicare PPO | Admitting: *Deleted

## 2021-05-11 NOTE — Telephone Encounter (Signed)
Okay, please let her daughter know we will fax it to [place] when I return to the office on tomorrow.

## 2021-05-11 NOTE — Telephone Encounter (Signed)
Patient's daughter is calling to request a refill of the nonformulary compound medication. Please advise.

## 2021-05-12 NOTE — Telephone Encounter (Signed)
Returned call to patient's daughter and informed per Dr Elisha Ponder that the prescription will be sent to pharmacy.verbalized understanding.

## 2021-05-12 NOTE — Telephone Encounter (Signed)
Thank you :)

## 2021-05-19 ENCOUNTER — Other Ambulatory Visit: Payer: Self-pay | Admitting: Family Medicine

## 2021-05-19 DIAGNOSIS — Z1231 Encounter for screening mammogram for malignant neoplasm of breast: Secondary | ICD-10-CM

## 2021-05-28 ENCOUNTER — Other Ambulatory Visit: Payer: Self-pay

## 2021-05-28 ENCOUNTER — Ambulatory Visit: Payer: Medicare PPO | Admitting: Cardiology

## 2021-05-28 ENCOUNTER — Encounter: Payer: Self-pay | Admitting: Cardiology

## 2021-05-28 VITALS — BP 123/71 | HR 60 | Temp 98.0°F | Resp 16 | Ht 66.0 in | Wt 171.0 lb

## 2021-05-28 DIAGNOSIS — I872 Venous insufficiency (chronic) (peripheral): Secondary | ICD-10-CM | POA: Diagnosis not present

## 2021-05-28 DIAGNOSIS — I10 Essential (primary) hypertension: Secondary | ICD-10-CM | POA: Diagnosis not present

## 2021-05-28 DIAGNOSIS — R600 Localized edema: Secondary | ICD-10-CM

## 2021-05-28 DIAGNOSIS — I5032 Chronic diastolic (congestive) heart failure: Secondary | ICD-10-CM | POA: Diagnosis not present

## 2021-05-28 DIAGNOSIS — Z86718 Personal history of other venous thrombosis and embolism: Secondary | ICD-10-CM | POA: Diagnosis not present

## 2021-05-28 MED ORDER — QUETIAPINE FUMARATE 25 MG PO TABS: 50.0000 mg | ORAL_TABLET | Freq: Every day | ORAL | 0 refills | Status: DC

## 2021-05-28 NOTE — Progress Notes (Signed)
[name] Date of Birth: [birth year] MRN: [id number] [place], PA-C Former Cardiology Providers: Jeri Lager, APRN, FNP-C Primary Cardiologist: Rex Kras, DO, [facility] (established care May 23, 2020)  Date: 05/28/21 Last Office Visit: 11/28/2020.   Chief Complaint  Patient presents with   Chronic diastolic heart failure    Follow-up    6 months     HPI  [name] is a 87 y.o.  female who presents to the office with a chief complaint of " 19-monthfollow-up for heart failure management."Patient's past medical history and cardiovascular risk factors include: Chronic heart failure with preserved EF, stage B, NYHA class II, history of recovered cardiomyopathy, history of DVT, GERD, history of GI bleed due to diverticulosis, chronic anemia, hypertension, chronic venous stasis, history of uterine cancer status post radiation therapy in 2016, history of breast cancer status postmastectomy and radiation therapy.  Patient presents to the office accompanied by her daughter [name]  Patient is being followed for the management of congestive heart failure.   Patient presents here today for 632-monthollow-up for management of heart failure with preserved EF.  Patient is noted to have reduced LVEF in the past but has recovered her LV function with guideline directed medical therapy.  Since last office visit she has not been hospitalized or seen in urgent care visits for cardiovascular symptoms.  She is overall euvolemic.  Her blood pressures are significantly better compared to the last office visit.  Medications reconciled.  Patient is currently on Entresto, spironolactone, and taking Lasix on a daily basis as opposed to as needed.  Patient denies any anginal discomfort at rest or with effort related activities.  Patient is also lost 6 pounds since last office visit.  FUNCTIONAL STATUS: Walks with cane and walker.    ALLERGIES: No Known  Allergies   MEDICATION LIST PRIOR TO VISIT: Current Outpatient Medications on File Prior to Visit  Medication Sig Dispense Refill   acetaminophen (TYLENOL) 325 MG tablet Take 650 mg by mouth every 6 (six) hours as needed.     calcium carbonate (OS-CAL - DOSED IN MG OF ELEMENTAL CALCIUM) 1250 (500 Ca) MG tablet Take 1 tablet by mouth.     Cholecalciferol (VITAMIN D3) 1000 UNITS CAPS Take 1,000 Units by mouth daily.     citalopram (CELEXA) 20 MG tablet Take 20 mg by mouth daily.     ENTRESTO 24-26 MG TAKE 1 TABLET BY MOUTH TWICE DAILY 180 tablet 3   furosemide (LASIX) 20 MG tablet Take 1 tablet (20 mg total) by mouth as needed. 90 tablet 1   Multiple Vitamins-Minerals (CENTRUM SILVER PO) Take 1 tablet by mouth daily.     NONFORMULARY OR COMPOUNDED ITEM Antifungal solution: Terbinafine 3%, Fluconazole 2%, Tea Tree Oil 5%, Urea 10%, Ibuprofen 2% in DMSO suspension #308m each 3   Omega-3 Fatty Acids (FISH OIL) 1000 MG CAPS Take 1,200 mg by mouth daily.     spironolactone (ALDACTONE) 25 MG tablet TAKE 1 TABLET(25 MG) BY MOUTH DAILY 90 tablet 1   No current facility-administered medications on file prior to visit.    PAST MEDICAL HISTORY: Past Medical History:  Diagnosis Date   Anxiety    [place] ([place]  left 1994, right 2007   Diverticulosis    Diverticulosis of colon with hemorrhage 06/19/2014   DVT (deep venous thrombosis) ([place]   right leg x 2   Endometrial adenocarcinoma ([place]14   biopsy   Family history of malignant  neoplasm of gastrointestinal tract    GERD (gastroesophageal reflux disease)    History of blood transfusion    Hx of radiation therapy 02/2006 - 03/2006   right breast   Hx of radiation therapy 5/12, 5/19, 5/29, 6/2, 04/30/2013   proximal vagina, 30 Gy in 5 sessions, HDR   Hypertension    Iron deficiency    Memory loss    Peripheral vascular disease ([place])    leg wounds   Personal history of radiation therapy    Ulcers of both lower extremities  ([place])    chronic   Venous stasis    bilateral    PAST SURGICAL HISTORY: Past Surgical History:  Procedure Laterality Date   ABDOMINAL HYSTERECTOMY  01/18/13   robotic, bso   BREAST LUMPECTOMY Right    COLONOSCOPY WITH PROPOFOL N/A 09/16/2017   Procedure: COLONOSCOPY WITH PROPOFOL;  Surgeon: Wilford Corner, MD;  Location: [place];  Service: Endoscopy;  Laterality: N/A;  Needs STAT CBC drawn before procedure   MASTECTOMY Left 1994   left , Tamoxifen x 17 yrs   partial mastectomy Right 2007   radiation    FAMILY HISTORY: The patient's family history includes Anuerysm in her sister; Breast cancer in her daughter and sister; Cancer in her brother and father; Colon cancer in her sister; Heart disease in her sister; Other in her mother; Prostate cancer in her brother.   SOCIAL HISTORY:  The patient  reports that she has never smoked. She has never used smokeless tobacco. She reports that she does not drink alcohol and does not use drugs.  Review of Systems  Constitutional: Negative for chills and fever.  HENT:  Negative for hoarse voice and nosebleeds.   Eyes:  Negative for discharge, double vision and pain.  Cardiovascular:  Positive for leg swelling (chronic and stable. ). Negative for chest pain, claudication, dyspnea on exertion, near-syncope, orthopnea, palpitations, paroxysmal nocturnal dyspnea and syncope.  Respiratory:  Negative for hemoptysis and shortness of breath.   Musculoskeletal:  Negative for muscle cramps and myalgias.  Gastrointestinal:  Negative for abdominal pain, constipation, diarrhea, hematemesis, hematochezia, melena, nausea and vomiting.  Neurological:  Negative for dizziness and light-headedness.   PHYSICAL EXAM: Vitals with BMI 05/28/2021 11/28/2020 07/08/2020  Height _0  _1  _2   Weight 171 lbs 177 lbs 181 lbs  BMI 27.61 54.65 03.54  Systolic 656 812 751  Diastolic 71 78 71  Pulse 60 63 65    CONSTITUTIONAL: Age-appropriate female,  hemodynamically stable, no acute distress.   SKIN: Skin is warm and dry. No rash noted. No cyanosis. No pallor. No jaundice HEAD: Normocephalic and atraumatic.  EYES: No scleral icterus MOUTH/THROAT: Moist oral membranes.  NECK: No JVD present. No thyromegaly noted. No carotid bruits  LYMPHATIC: No visible cervical adenopathy.  CHEST Normal respiratory effort. No intercostal retractions  LUNGS: Clear to auscultation bilaterally.  No stridor. No wheezes. No rales.  CARDIOVASCULAR: Regular, positive Z0-Y1, soft holosystolic murmur heard at the apex, no gallops or rubs. ABDOMINAL: No apparent ascites.  EXTREMITIES: No peripheral edema, wearing bilateral compression stockings up to the knee. HEMATOLOGIC: No significant bruising NEUROLOGIC: Oriented to person, place, and time. Nonfocal. Normal muscle tone.  PSYCHIATRIC: Normal mood and affect. Normal behavior. Cooperative  CARDIAC DATABASE: EKG: 05/28/2021: Normal sinus rhythm, 61 bpm, first-degree AV block, consider old anteroseptal infarct, without underlying injury pattern.  Echocardiogram: 08/22/2018: LVEF 44%, moderate LVH, mildly decreased global wall motion, grade 1 diastolic impairment.  Trace MR, moderate TR, RVSP 40  mmHg.  07/02/2019: LVEF 40%, grade 1 diastolic impairment, mild biatrial dilatation, trace AR, mild MR, moderate TR, PASP 44 mmHg.  Stress Testing:  None  Heart Catheterization: None   LABORATORY DATA: CBC Latest Ref Rng & Units 12/20/2018 08/08/2018 06/19/2018  WBC 4.0 - 10.5 K/uL 5.2 5.1 6.3  Hemoglobin 12.0 - 15.0 g/dL 10.5(L) 10.2(L) 10.8(L)  Hematocrit 36.0 - 46.0 % 32.0(L) 30.8(L) 32.4(L)  Platelets 150 - 400 K/uL 191 218 216    CMP Latest Ref Rng & Units 01/15/2019 12/20/2018 08/08/2018  Glucose 65 - 99 mg/dL 84 89 83  BUN 10 - 36 mg/dL _0 Creatinine 0.57 - 1.00 mg/dL 1.06(H) 1.08(H) 0.93  Sodium 134 - 144 mmol/L 143 141 143  Potassium 3.5 - 5.2 mmol/L 5.0 4.3 4.2  Chloride 96 - 106 mmol/L 103 103 105   CO2 20 - 29 mmol/L 19(L) 31 30  Calcium 8.7 - 10.3 mg/dL 9.8 9.7 9.3  Total Protein 6.5 - 8.1 g/dL - 6.8 6.5  Total Bilirubin 0.3 - 1.2 mg/dL - 0.4 0.8  Alkaline Phos 38 - 126 U/L - 62 51  AST 15 - 41 U/L - 12(L) 17  ALT 0 - 44 U/L - 7 16    Lipid Panel  No results found for: CHOL, TRIG, HDL, CHOLHDL, VLDL, LDLCALC, LDLDIRECT, LABVLDL  No results found for: HGBA1C No components found for: NTPROBNP No results found for: TSH  Cardiac Panel (last 3 results) No results for input(s): CKTOTAL, CKMB, TROPONINIHS, RELINDX in the last 72 hours.   External Labs: Collected: 09/24/2020 from care everywhere Creatinine 1.26 mg/dL. eGFR: 48 mL/min per 1.73 m Potassium 4.8 TSH 2.31 Magnesium 2.3  Collected: 08/20/2020 Serum creatinine 1.26 mg/dL. GFR 48 Lipid profile: Total cholesterol 161, triglycerides 54, HDL 56, LDL 94  IMPRESSION:    ICD-10-CM   1. Chronic diastolic heart failure (HCC)  I50.32 EKG 12-Lead    Pro b natriuretic peptide (BNP)    Basic metabolic panel    Magnesium    2. Essential hypertension  I10     3. History of deep venous thrombosis (DVT) of distal vein of right lower extremity  Z86.718     4. Chronic venous stasis dermatitis of both lower extremities  I87.2     5. Bilateral leg edema  R60.0        RECOMMENDATIONS: [name] is a 87 y.o. female whose past medical history and cardiovascular risk factors include: Chronic heart failure with preserved EF, stage B, NYHA class II, history of recovered cardiomyopathy, history of DVT, GERD, history of GI bleed due to diverticulosis, chronic anemia, hypertension, chronic venous stasis, history of uterine cancer status post radiation therapy in 2016, history of breast cancer status postmastectomy and radiation therapy.  Chronic heart failure with preserved EF, stage B, NYHA class II: Patient is currently euvolemic.   Has lost approximately 6 pounds since last office visit. Medications  reconciled. Patient takes Lasix on a daily basis as opposed to as needed basis.   Will check labs today: BMP, BNP, magnesium level.   Home blood pressures are consistently around 130 mmHg.  Blood pressure log reviewed.    Recovered cardiomyopathy: Educated on importance of guideline directed medical therapy and improving her modifiable cardiovascular risk factors.  See above  History of DVT: Currently not on oral anticoagulation, managed by primary team  Benign essential hypertension:  Blood pressure log reviewed.   Medications reconciled.   Low-salt diet recommended.   Currently managed by  primary care provider.  Chronic venous stasis: Prescriptions for compression stockings provided to the patient as well.  FINAL MEDICATION LIST END OF ENCOUNTER: No orders of the defined types were placed in this encounter.    Current Outpatient Medications:    acetaminophen (TYLENOL) 325 MG tablet, Take 650 mg by mouth every 6 (six) hours as needed., Disp: , Rfl:    calcium carbonate (OS-CAL - DOSED IN MG OF ELEMENTAL CALCIUM) 1250 (500 Ca) MG tablet, Take 1 tablet by mouth., Disp: , Rfl:    Cholecalciferol (VITAMIN D3) 1000 UNITS CAPS, Take 1,000 Units by mouth daily., Disp: , Rfl:    citalopram (CELEXA) 20 MG tablet, Take 20 mg by mouth daily., Disp: , Rfl:    ENTRESTO 24-26 MG, TAKE 1 TABLET BY MOUTH TWICE DAILY, Disp: 180 tablet, Rfl: 3   furosemide (LASIX) 20 MG tablet, Take 1 tablet (20 mg total) by mouth as needed., Disp: 90 tablet, Rfl: 1   Multiple Vitamins-Minerals (CENTRUM SILVER PO), Take 1 tablet by mouth daily., Disp: , Rfl:    NONFORMULARY OR COMPOUNDED ITEM, Antifungal solution: Terbinafine 3%, Fluconazole 2%, Tea Tree Oil 5%, Urea 10%, Ibuprofen 2% in DMSO suspension #32m, Disp: 1 each, Rfl: 3   Omega-3 Fatty Acids (FISH OIL) 1000 MG CAPS, Take 1,200 mg by mouth daily., Disp: , Rfl:    QUEtiapine (SEROQUEL) 25 MG tablet, Take 2 tablets (50 mg total) by mouth at bedtime., Disp: 180  tablet, Rfl: 0   spironolactone (ALDACTONE) 25 MG tablet, TAKE 1 TABLET(25 MG) BY MOUTH DAILY, Disp: 90 tablet, Rfl: 1  Orders Placed This Encounter  Procedures   Pro b natriuretic peptide (BNP)   Basic metabolic panel   Magnesium   EKG 12-Lead   Total time spent 30 minutes:   --Continue cardiac medications as reconciled in final medication list. --Return in about 6 months (around 11/28/2021) for Follow up, heart failure management.. Or sooner if needed. --Continue follow-up with your primary care physician regarding the management of your other chronic comorbid conditions.  Patient's questions and concerns were addressed to her satisfaction. She voices understanding of the instructions provided during this encounter.   This note was created using a voice recognition software as a result there may be grammatical errors inadvertently enclosed that do not reflect the nature of this encounter. Every attempt is made to correct such errors.  SRex Kras DNevada FBaylor [facility] Pager: [phone]: [phone]

## 2021-05-29 LAB — BASIC METABOLIC PANEL
BUN/Creatinine Ratio: 22 (ref 12–28)
BUN: 29 mg/dL (ref 10–36)
CO2: 22 mmol/L (ref 20–29)
Calcium: 9.7 mg/dL (ref 8.7–10.3)
Chloride: 102 mmol/L (ref 96–106)
Creatinine, Ser: 1.34 mg/dL — ABNORMAL HIGH (ref 0.57–1.00)
Glucose: 102 mg/dL — ABNORMAL HIGH (ref 65–99)
Potassium: 4.1 mmol/L (ref 3.5–5.2)
Sodium: 140 mmol/L (ref 134–144)
eGFR: 37 mL/min/{1.73_m2} — ABNORMAL LOW (ref >59)

## 2021-05-29 LAB — PRO B NATRIURETIC PEPTIDE: NT-Pro BNP: 252 pg/mL (ref 0–738)

## 2021-05-29 LAB — MAGNESIUM: Magnesium: 2 mg/dL (ref 1.6–2.3)

## 2021-06-01 NOTE — Progress Notes (Signed)
Pt aware.

## 2021-06-12 ENCOUNTER — Other Ambulatory Visit: Payer: Self-pay

## 2021-06-12 ENCOUNTER — Encounter: Payer: Self-pay | Admitting: Podiatry

## 2021-06-12 ENCOUNTER — Ambulatory Visit: Payer: Medicare PPO | Admitting: Podiatry

## 2021-06-12 DIAGNOSIS — M79675 Pain in left toe(s): Secondary | ICD-10-CM | POA: Diagnosis not present

## 2021-06-12 DIAGNOSIS — M79674 Pain in right toe(s): Secondary | ICD-10-CM

## 2021-06-12 DIAGNOSIS — B351 Tinea unguium: Secondary | ICD-10-CM

## 2021-06-12 DIAGNOSIS — L84 Corns and callosities: Secondary | ICD-10-CM

## 2021-06-12 DIAGNOSIS — I739 Peripheral vascular disease, unspecified: Secondary | ICD-10-CM | POA: Diagnosis not present

## 2021-06-14 NOTE — Progress Notes (Signed)
Subjective: [name] is a pleasant 87 y.o. female patient seen today painful thick toenails that are difficult to trim. Pain interferes with ambulation. Aggravating factors include wearing enclosed shoe gear. Pain is relieved with periodic professional debridement.  PCP is Marda Stalker, PA-C.   No Known Allergies  Objective: Physical Exam  General: [name] is a pleasant 87 y.o. African American female, in NAD. AAO x 3.   Vascular:  Capillary refill time to digits immediate b/l. Palpable DP pulse(s) b/l lower extremities Nonpalpable PT pulse(s) b/l lower extremities. Pedal hair absent. Lower extremity skin temperature gradient within normal limits. Evidence of chronic venous insufficiency b/l lower extremities.  Dermatological:  Pedal skin is thin shiny, atrophic b/l lower extremities. Toenails 1-5 left, R hallux, R 3rd toe, R 4th toe, and R 5th toe elongated, discolored, dystrophic, thickened, and crumbly with subungual debris and tenderness to dorsal palpation. Hyperkeratotic lesion(s) L hallux, L 2nd toe PIPJ and medial aspect L 2nd toe.  No erythema, no edema, no drainage, no fluctuance.  Musculoskeletal:  Normal muscle strength 5/5 to all lower extremity muscle groups bilaterally. No pain crepitus or joint limitation noted with ROM b/l. Hallux valgus with bunion deformity noted b/l lower extremities. Hammertoe(s) noted to the L 2nd toe.  Neurological:  Protective sensation intact 5/5 intact bilaterally with 10g monofilament b/l. Vibratory sensation intact b/l.  Assessment and Plan:  1. Pain due to onychomycosis of toenails of both feet   2. Corns   3. PAD (peripheral artery disease) ([place])   -Examined patient. -Patient to continue soft, supportive shoe gear daily. -Toenails 1-5 left, R hallux, R 3rd toe, R 4th toe, and R 5th toe debrided in length and girth without iatrogenic bleeding with sterile nail nipper and dremel.  -Corn(s) L hallux and L 2nd toe PIPJ  and medial L 2nd toe pared utilizing sterile scalpel blade without complication or incident. Total number debrided=2. -Patient to report any pedal injuries to medical professional immediately. -Patient/POA to call should there be question/concern in the interim.  Return in about 3 months (around 09/12/2021).  Marzetta Board, DPM

## 2021-07-08 ENCOUNTER — Ambulatory Visit: Payer: Medicare PPO | Admitting: Family Medicine

## 2021-07-08 ENCOUNTER — Other Ambulatory Visit: Payer: Self-pay

## 2021-07-08 ENCOUNTER — Encounter: Payer: Self-pay | Admitting: Family Medicine

## 2021-07-08 VITALS — BP 136/65 | HR 59 | Ht 66.0 in | Wt 171.0 lb

## 2021-07-08 DIAGNOSIS — B029 Zoster without complications: Secondary | ICD-10-CM

## 2021-07-08 DIAGNOSIS — R269 Unspecified abnormalities of gait and mobility: Secondary | ICD-10-CM

## 2021-07-08 DIAGNOSIS — F0391 Unspecified dementia with behavioral disturbance: Secondary | ICD-10-CM

## 2021-07-08 MED ORDER — QUETIAPINE FUMARATE 25 MG PO TABS: 50.0000 mg | ORAL_TABLET | Freq: Every day | ORAL | 3 refills | Status: DC

## 2021-07-08 MED ORDER — VALACYCLOVIR HCL 1 G PO TABS
1000.0000 mg | ORAL_TABLET | Freq: Two times a day (BID) | ORAL | 0 refills | Status: DC
Start: 2021-07-08 — End: 2021-11-30

## 2021-07-08 NOTE — Patient Instructions (Signed)
Below is our plan:  We will continue quetiapine '25mg'$  in the evening and again at bedtime. We would consider increasing the dose if needed in the future. We could also consider adding an as needed medication to help with restlessness and anxiety. Please call me if you feel we need to change treatment plan. Consider home health or palliative evaluation.   I am going to call in valacyclovir to see if this helps with the scalp pain. If symptoms do not improve, please contact PCP.   Please make sure you are staying well hydrated. I recommend 50-60 ounces daily. Well balanced diet and regular exercise encouraged. Consistent sleep schedule with 6-8 hours recommended.   Please continue follow up with care team as directed.   Follow up with me in 1 year   You may receive a survey regarding today's visit. I encourage you to leave honest feed back as I do use this information to improve patient care. Thank you for seeing me today!

## 2021-07-08 NOTE — Progress Notes (Signed)
PATIENT: [name] DOB: 10/14/1925  REASON FOR VISIT: follow up HISTORY FROM: patient  Chief Complaint  Patient presents with   Follow-up    Pt with daughter. She states memory is about the same. She states that the hallucinations and combativeness is still present and occurs more at night. She has been having frequent headaches. Daughter states patient is unable to do memory testing.      HISTORY OF PRESENT ILLNESS:  [name] is a 87 year old female, seen in request by her primary care PA Marda Stalker for evaluation of dementia, initial evaluation was on June 05, 2019.  She is accompanied by her granddaughter [name] at today's visit.   I have reviewed and summarized the referring note from the referring physician.  She had past medical history of breast cancer, left side was treated in 1994, right side was in 2007, she also had a history of DVT, endometrial adenocarcinoma, peripheral vascular disease, she lives at home with her daughter, the care of her 1.05 is [name].   She began to have gradual onset memory loss since 2019, getting worse rapidly, she is independent in daily activity, no longer driving, but still make simple meals, dressing, toileting, bathing without help, she has decreased appetite, sometimes has difficulty sleeping   She has gradual worsening visual hallucinations, she see people at her home, talking, irritated sometimes scared her much, she got up in the middle of the night, she has to use the cane to knock on the wall to stop them talking.  This happened mostly at the evening time, sometimes at daytime as well,   She ambulate with a cane due to low back, joint pain,   Laboratory evaluations in April 2020, hematocrit 34, creatinine 1.05, normal B12, TSH 3.57, Personally reviewed CT head in April 2020: Generalized atrophy, small vessel disease, no acute abnormalities.   UPDATE 07/08/2020 ALL: [name] is a 87 y.o. female  here today for follow up. She continues Seroquel '50mg'$  at bedtime. Her daughter presents today and aids in history. She reports that Seroquel has helped significantly with sleep. She is resting well. She does continue to have hallucinations but feel they are not as frequent. Hallucinations are auditory and visual and occur several times a week. She is sometimes frightened but easily reassured. She remains active. She lives with her daughter. She is able to do her own grocery shopping. She does not drive. She is needing more assistance with walking. No falls. She uses a cane for short distances and a Rolator for longer distances. She is able to performs ADL's. She requires assistance with medications. Family decided against MRI and PT ordered at last visit. They do not feel these are needed.   UPDATE 07/08/2021 ALL: [name] returns for follow for follow up for AD. Her daughter presents with her and gives most of history. She is stable. No significant changes. She continues quetiapine '25mg'$  early afternoon and right before bed. She continues to have visual and auditory hallucinations. Mostly at night. She does seem frightened at times but easily distracted and reassured. She is sleeping more. Eating less. She has lost some weight but seems stable. She has had three falls since last being seen. No obvious injuries. She is using cane or walker in the home and wheelchair for longer distances. She lives with her daughter. She has 5 children, three daughters and two sons who all care for her. Her niece also helps.   Her daughter is concerned with a  knot on the right side of her scalp that Mrs Manasse reports has been tender to touch for the past 4-5 days. Similar symptoms presented to PCP in March and treated as shingles. Symptoms resolved within a couple of days after starting valacyclovir. No obvious rash, fever, chills or other signs of infection.    REVIEW OF SYSTEMS: Out of a complete 14 system review of  symptoms, the patient complains only of the following symptoms, memory loss, scalp pain and all other reviewed systems are negative.   ALLERGIES: No Known Allergies  HOME MEDICATIONS: Outpatient Medications Prior to Visit  Medication Sig Dispense Refill   acetaminophen (TYLENOL) 325 MG tablet Take 650 mg by mouth every 6 (six) hours as needed.     calcium carbonate (OS-CAL - DOSED IN MG OF ELEMENTAL CALCIUM) 1250 (500 Ca) MG tablet Take 1 tablet by mouth.     Cholecalciferol (VITAMIN D3) 1000 UNITS CAPS Take 1,000 Units by mouth daily.     citalopram (CELEXA) 20 MG tablet Take 20 mg by mouth daily.     ENTRESTO 24-26 MG TAKE 1 TABLET BY MOUTH TWICE DAILY 180 tablet 3   furosemide (LASIX) 20 MG tablet Take 1 tablet (20 mg total) by mouth as needed. 90 tablet 1   Multiple Vitamins-Minerals (CENTRUM SILVER PO) Take 1 tablet by mouth daily.     NONFORMULARY OR COMPOUNDED ITEM Antifungal solution: Terbinafine 3%, Fluconazole 2%, Tea Tree Oil 5%, Urea 10%, Ibuprofen 2% in DMSO suspension #28m 1 each 3   Omega-3 Fatty Acids (FISH OIL) 1000 MG CAPS Take 1,200 mg by mouth daily.     spironolactone (ALDACTONE) 25 MG tablet TAKE 1 TABLET(25 MG) BY MOUTH DAILY 90 tablet 1   QUEtiapine (SEROQUEL) 25 MG tablet Take 2 tablets (50 mg total) by mouth at bedtime. 180 tablet 0   No facility-administered medications prior to visit.    PAST MEDICAL HISTORY: Past Medical History:  Diagnosis Date   Anxiety    Breast cancer ([facility]    left 1994, right 2007   Diverticulosis    Diverticulosis of colon with hemorrhage 06/19/2014   DVT ([place] venous thrombosis) ([place] 1992   right leg x 2   Endometrial adenocarcinoma ([facility] 12/25/12   biopsy   Family history of malignant neoplasm of gastrointestinal tract    GERD (gastroesophageal reflux disease)    History of blood transfusion    Hx of radiation therapy 02/2006 - 03/2006   right breast   Hx of radiation therapy 5/12, 5/19, 5/29, 6/2, 04/30/2013   proximal  vagina, 30 Gy in 5 sessions, HDR   Hypertension    Iron deficiency    Memory loss    Peripheral vascular disease ([facility]    leg wounds   Personal history of radiation therapy    Ulcers of both lower extremities ([facility]    chronic   Venous stasis    bilateral    PAST SURGICAL HISTORY: Past Surgical History:  Procedure Laterality Date   ABDOMINAL HYSTERECTOMY  01/18/13   robotic, bso   BREAST LUMPECTOMY Right    COLONOSCOPY WITH PROPOFOL N/A 09/16/2017   Procedure: COLONOSCOPY WITH PROPOFOL;  Surgeon: SWilford Corner MD;  [facility];  Service: Endoscopy;  Laterality: N/A;  Needs STAT CBC drawn before procedure   MASTECTOMY Left 1994   left , Tamoxifen x 17 yrs   partial mastectomy Right 2007   radiation    FAMILY HISTORY: Family History  Problem Relation Age of Onset   Cancer  Father        skin   Other Mother        died during childbirth   Cancer Brother        prostate   Prostate cancer Brother    Breast cancer Daughter    Colon cancer Sister    Heart disease Sister        MI   Breast cancer Sister    Anuerysm Sister     SOCIAL HISTORY: Social History   Socioeconomic History   Marital status: Widowed    Spouse name: Not on file   Number of children: 5   Years of education: 2nd or 3rd grade   Highest education level: Not on file  Occupational History   Occupation: Retired   Tobacco Use   Smoking status: Never   Smokeless tobacco: Never  Vaping Use   Vaping Use: Never used  Substance and Sexual Activity   Alcohol use: No   Drug use: No   Sexual activity: Never  Other Topics Concern   Not on file  Social History Narrative   No caffeine use.   Lives at home with her daughter.   Right-handed.   Social Determinants of Health   Financial Resource Strain: Not on file  Food Insecurity: Not on file  Transportation Needs: Not on file  Physical Activity: Not on file  Stress: Not on file  Social Connections: Not on file  Intimate Partner  Violence: Not on file      PHYSICAL EXAM  Vitals:   07/08/21 1014  BP: 136/65  Pulse: (!) 59  Weight: 171 lb (77.6 kg)  Height: '5\' 6"'$  (1.676 m)    Body mass index is 27.6 kg/m.  Generalized: Well developed, in no acute distress  Cardiology: normal rate and rhythm, no murmur noted Respiratory: clear to auscultation bilaterally  Neurological examination  Mentation: Alert, pleasant, not oriented to time, place, but able to assist with some history taking. Follows all commands speech and language fluent Cranial nerve II-XII: Pupils were equal round reactive to light. Extraocular movements were full, visual field were full  Motor: The motor testing reveals 5 over 5 strength of all 4 extremities. Good symmetric motor tone is noted throughout.  Sensory: Sensory testing is intact to soft touch on all 4 extremities. No evidence of extinction is noted.  Coordination: Cerebellar testing reveals good finger-nose-finger and heel-to-shin bilaterally but requires repetitive redirection.  Gait and station: Gait not assessed today, she is in wheelchair.  Skin: tenderness reported with light palpation of right temporal/parietal region, very slight asymmetry compared to bony structure of left side, no obvious rash, slight erythema could be associated with her rubbing area, no signs of infection.   DIAGNOSTIC DATA (LABS, IMAGING, TESTING) - I reviewed patient records, labs, notes, testing and imaging myself where available.  MMSE - Mini Mental State Exam 07/08/2021 06/05/2019  Not completed: Unable to complete -  Orientation to time - 2  Orientation to Place - 3  Registration - 3  Attention/ Calculation - 0  Recall - 0  Language- name 2 objects - 2  Language- repeat - 1  Language- follow 3 step command - 3  Language- read & follow direction - 1  Write a sentence - 0  Copy design - 0  Total score - 15    No flowsheet data found.   Lab Results  Component Value Date   WBC 5.2 12/20/2018    HGB 10.5 (L) 12/20/2018   HCT  32.0 (L) 12/20/2018   MCV 87.2 12/20/2018   PLT 191 12/20/2018      Component Value Date/Time   NA 140 05/28/2021 1428   K 4.1 05/28/2021 1428   CL 102 05/28/2021 1428   CO2 22 05/28/2021 1428   GLUCOSE 102 (H) 05/28/2021 1428   GLUCOSE 89 12/20/2018 1137   BUN 29 05/28/2021 1428   CREATININE 1.34 (H) 05/28/2021 1428   CREATININE 1.08 (H) 12/20/2018 1137   CALCIUM 9.7 05/28/2021 1428   PROT 6.8 12/20/2018 1137   ALBUMIN 3.5 12/20/2018 1137   AST 12 (L) 12/20/2018 1137   ALT 7 12/20/2018 1137   ALKPHOS 62 12/20/2018 1137   BILITOT 0.4 12/20/2018 1137   GFRNONAA 46 (L) 01/15/2019 1152   GFRNONAA 45 (L) 12/20/2018 1137   GFRAA 53 (L) 01/15/2019 1152   GFRAA 52 (L) 12/20/2018 1137   No results found for: CHOL, HDL, LDLCALC, LDLDIRECT, TRIG, CHOLHDL No results found for: HGBA1C Lab Results  Component Value Date   VITAMINB12 1,566 (H) 01/02/2018   No results found for: TSH     ASSESSMENT AND PLAN 87 y.o. year old female  has a past medical history of Anxiety, Breast cancer ([place]), Diverticulosis, Diverticulosis of colon with hemorrhage (06/19/2014), DVT (deep venous thrombosis) ([place]) (1992), Endometrial adenocarcinoma ([place]) (12/25/12), Family history of malignant neoplasm of gastrointestinal tract, GERD (gastroesophageal reflux disease), History of blood transfusion, radiation therapy (02/2006 - 03/2006), radiation therapy (5/12, 5/19, 5/29, 6/2, 04/30/2013), Hypertension, Iron deficiency, Memory loss, Peripheral vascular disease ([place]), Personal history of radiation therapy, Ulcers of both lower extremities ([place]), and Venous stasis. here with     ICD-10-CM   1. Dementia with behavioral disturbance, unspecified dementia type ([place])  F03.91     2. Gait abnormality  R26.9     3. Herpes zoster without complication  [id number]        Ms Chesebro is doing fairly well. She was unable to complete MMSE. We will continue Seroquel '50mg'$  daily. We have discussed  concerns of hallucinations. We could consider increasing dose versus adding PRN anxiety medications if she has any worsening or aggressive behavioral changes. She was encouraged to discuss home health referral versus palliative referral with PCP. Family feels they are managing well at this time. They will monitor weight at home, BMI 27. Fall precautions reviewed. She was encouraged to stay physically and mentally active. Memory compensation strategies reviewed. I will start her on valacyclovir '1000mg'$  BID for 7 days for presumed shingles. Family is aware to reach out to PCP for any worsening or unresolved symptoms. She will continue close follow up with PCP. Healthy lifestyle habits encouraged. Fall and safety precautions advised. She will follow up with [place] in 1 year, sooner if needed.    No orders of the defined types were placed in this encounter.    Meds ordered this encounter  Medications   QUEtiapine (SEROQUEL) 25 MG tablet    Sig: Take 2 tablets (50 mg total) by mouth at bedtime.    Dispense:  180 tablet    Refill:  3    Order Specific Question:   Supervising Provider    Answer:   Melvenia Beam [id number]   valACYclovir (VALTREX) 1000 MG tablet    Sig: Take 1 tablet (1,000 mg total) by mouth 2 (two) times daily.    Dispense:  14 tablet    Refill:  0    Order Specific Question:   Supervising Provider    Answer:   Jaynee Eagles,  Larina Bras [id number]        Debbora Presto, FNP-C 07/08/2021, 11:35 AM [facility] [address] [place], [place] [address] [phone]

## 2021-07-10 ENCOUNTER — Ambulatory Visit
Admission: RE | Admit: 2021-07-10 | Discharge: 2021-07-10 | Disposition: A | Discharge status: Home / Self Care | Payer: Medicare PPO | Source: Ambulatory Visit | Attending: Family Medicine | Admitting: Family Medicine

## 2021-07-10 ENCOUNTER — Other Ambulatory Visit: Payer: Self-pay

## 2021-07-10 DIAGNOSIS — Z1231 Encounter for screening mammogram for malignant neoplasm of breast: Secondary | ICD-10-CM

## 2021-08-26 DIAGNOSIS — I5032 Chronic diastolic (congestive) heart failure: Secondary | ICD-10-CM | POA: Diagnosis not present

## 2021-08-26 DIAGNOSIS — Z0000 Encounter for general adult medical examination without abnormal findings: Secondary | ICD-10-CM | POA: Diagnosis not present

## 2021-08-26 DIAGNOSIS — G479 Sleep disorder, unspecified: Secondary | ICD-10-CM | POA: Diagnosis not present

## 2021-08-26 DIAGNOSIS — D649 Anemia, unspecified: Secondary | ICD-10-CM | POA: Diagnosis not present

## 2021-08-26 DIAGNOSIS — Z23 Encounter for immunization: Secondary | ICD-10-CM | POA: Diagnosis not present

## 2021-08-26 DIAGNOSIS — E669 Obesity, unspecified: Secondary | ICD-10-CM | POA: Diagnosis not present

## 2021-08-26 DIAGNOSIS — R413 Other amnesia: Secondary | ICD-10-CM | POA: Diagnosis not present

## 2021-08-26 DIAGNOSIS — I10 Essential (primary) hypertension: Secondary | ICD-10-CM | POA: Diagnosis not present

## 2021-08-26 DIAGNOSIS — R296 Repeated falls: Secondary | ICD-10-CM | POA: Diagnosis not present

## 2021-08-27 ENCOUNTER — Telehealth: Payer: Self-pay | Admitting: Family Medicine

## 2021-08-27 NOTE — Telephone Encounter (Signed)
Pt's daughter said medication isn't doing anything and making delusions worse. Would like a callback for another medication that Amy told her about if this one didn't work.

## 2021-09-02 ENCOUNTER — Other Ambulatory Visit: Payer: Self-pay | Admitting: Cardiology

## 2021-09-02 DIAGNOSIS — I5042 Chronic combined systolic (congestive) and diastolic (congestive) heart failure: Secondary | ICD-10-CM

## 2021-09-15 ENCOUNTER — Ambulatory Visit: Payer: Medicare PPO | Admitting: Podiatry

## 2021-09-15 ENCOUNTER — Other Ambulatory Visit: Payer: Self-pay

## 2021-09-15 DIAGNOSIS — L97321 Non-pressure chronic ulcer of left ankle limited to breakdown of skin: Secondary | ICD-10-CM

## 2021-09-15 DIAGNOSIS — B351 Tinea unguium: Secondary | ICD-10-CM | POA: Diagnosis not present

## 2021-09-15 DIAGNOSIS — M79674 Pain in right toe(s): Secondary | ICD-10-CM

## 2021-09-15 DIAGNOSIS — L84 Corns and callosities: Secondary | ICD-10-CM

## 2021-09-15 DIAGNOSIS — I739 Peripheral vascular disease, unspecified: Secondary | ICD-10-CM

## 2021-09-15 DIAGNOSIS — M79675 Pain in left toe(s): Secondary | ICD-10-CM | POA: Diagnosis not present

## 2021-09-20 NOTE — Progress Notes (Signed)
  Subjective:  Patient ID: [name], female    DOB: 02-Jan-1925,  MRN: [id number]  [name] presents to clinic today for for at risk foot care. Patient has h/o PAD and corn(s) b/l and painful thick toenails that are difficult to trim. Painful toenails interfere with ambulation. Aggravating factors include wearing enclosed shoe gear. Pain is relieved with periodic professional debridement. Painful corns are aggravated when weightbearing when wearing enclosed shoe gear. Pain is relieved with periodic professional debridement.  Her daughter is present during today's visit. Daughter states Mom had ulcers of both legs which healed with help of Wound Care. They were unaware of small ulcer on lateral aspect of left ankle.   PCP is Marda Stalker, PA-C , and last visit was September, 2022.  No Known Allergies  Review of Systems: Negative except as noted in the HPI. Objective:   Constitutional [name] is a pleasant 87 y.o. African American female, WD, WN in NAD. AAO x 3.   Vascular Capillary refill time to digits immediate b/l. Palpable DP pulse(s) b/l lower extremities Nonpalpable PT pulse(s) b/l lower extremities. Pedal hair absent. Lower extremity skin temperature gradient within normal limits. No pain with calf compression RLE. Evidence of chronic venous insufficiency b/l lower extremities. No cyanosis or clubbing noted.  Neurologic Normal speech. Oriented to person, place, and time. Protective sensation intact 5/5 intact bilaterally with 10g monofilament b/l. Vibratory sensation intact b/l.  Dermatologic Pedal skin thin, shiny and atrophic b/l LE. Toenails 1-5 left, R hallux, R 3rd toe, R 4th toe, and R 5th toe well maintained with adequate length. No erythema, no edema, no drainage, no fluctuance. Hyperkeratotic lesion(s) left 2nd PIPJ dorsal PIPJ and medial PIPJ left 2nd toe and L hallux.  No erythema, no edema, no drainage, no fluctuance. Healing ulcer to level of dermis  noted lateral aspect of left ankle; appears to be contracting. No erythema, no edema, no drainage, no fluctuance.  Orthopedic: Normal muscle strength 5/5 to all lower extremity muscle groups bilaterally. Hammertoe(s) noted to the L 2nd toe.   Radiographs: None Assessment:   1. Pain due to onychomycosis of toenails of both feet   2. Corns   3. Ankle ulcer, left, limited to breakdown of skin ([place])   4. PAD (peripheral artery disease) ([place])    Plan:  Patient was evaluated and treated and all questions answered. Consent given for treatment as described below: -Examined patient. -Ulcer left ankle appears to be contracting/healing. Monitor. If condition worsens, daughter will contact Wound Care. -Toenails 1-5 left, R hallux, R 3rd toe, R 4th toe, and R 5th toe debrided in length and girth without iatrogenic bleeding with sterile nail nipper and dremel.  -Corn(s) L hallux interdigitally and L 2nd toe dorsal PIPJ and medial aspect of left 2nd toe pared utilizing sterile scalpel blade without complication or incident. Total number debrided=3. -Patient/POA to call should there be question/concern in the interim.  Return in about 3 months (around 12/16/2021).  Marzetta Board, DPM

## 2021-09-21 ENCOUNTER — Encounter: Payer: Self-pay | Admitting: Podiatry

## 2021-09-30 DIAGNOSIS — J069 Acute upper respiratory infection, unspecified: Secondary | ICD-10-CM | POA: Diagnosis not present

## 2021-09-30 DIAGNOSIS — R059 Cough, unspecified: Secondary | ICD-10-CM | POA: Diagnosis not present

## 2021-09-30 DIAGNOSIS — U071 COVID-19: Secondary | ICD-10-CM | POA: Diagnosis not present

## 2021-09-30 DIAGNOSIS — J029 Acute pharyngitis, unspecified: Secondary | ICD-10-CM | POA: Diagnosis not present

## 2021-10-11 DIAGNOSIS — U071 COVID-19: Secondary | ICD-10-CM | POA: Diagnosis not present

## 2021-10-17 DIAGNOSIS — U071 COVID-19: Secondary | ICD-10-CM | POA: Diagnosis not present

## 2021-11-05 DIAGNOSIS — I5032 Chronic diastolic (congestive) heart failure: Secondary | ICD-10-CM | POA: Diagnosis not present

## 2021-11-05 DIAGNOSIS — I872 Venous insufficiency (chronic) (peripheral): Secondary | ICD-10-CM | POA: Diagnosis not present

## 2021-11-05 DIAGNOSIS — R350 Frequency of micturition: Secondary | ICD-10-CM | POA: Diagnosis not present

## 2021-11-27 ENCOUNTER — Other Ambulatory Visit: Payer: Self-pay

## 2021-11-27 ENCOUNTER — Encounter (HOSPITAL_BASED_OUTPATIENT_CLINIC_OR_DEPARTMENT_OTHER): Payer: Medicare PPO | Attending: Internal Medicine | Admitting: Internal Medicine

## 2021-11-27 DIAGNOSIS — Z8543 Personal history of malignant neoplasm of ovary: Secondary | ICD-10-CM | POA: Insufficient documentation

## 2021-11-27 DIAGNOSIS — I5042 Chronic combined systolic (congestive) and diastolic (congestive) heart failure: Secondary | ICD-10-CM | POA: Diagnosis not present

## 2021-11-27 DIAGNOSIS — M1990 Unspecified osteoarthritis, unspecified site: Secondary | ICD-10-CM | POA: Diagnosis not present

## 2021-11-27 DIAGNOSIS — Z853 Personal history of malignant neoplasm of breast: Secondary | ICD-10-CM | POA: Diagnosis not present

## 2021-11-27 DIAGNOSIS — C50919 Malignant neoplasm of unspecified site of unspecified female breast: Secondary | ICD-10-CM | POA: Insufficient documentation

## 2021-11-27 DIAGNOSIS — I739 Peripheral vascular disease, unspecified: Secondary | ICD-10-CM | POA: Insufficient documentation

## 2021-11-27 DIAGNOSIS — F0280 Dementia in other diseases classified elsewhere without behavioral disturbance: Secondary | ICD-10-CM | POA: Insufficient documentation

## 2021-11-27 DIAGNOSIS — I87312 Chronic venous hypertension (idiopathic) with ulcer of left lower extremity: Secondary | ICD-10-CM | POA: Insufficient documentation

## 2021-11-27 DIAGNOSIS — C541 Malignant neoplasm of endometrium: Secondary | ICD-10-CM | POA: Diagnosis not present

## 2021-11-27 DIAGNOSIS — L97822 Non-pressure chronic ulcer of other part of left lower leg with fat layer exposed: Secondary | ICD-10-CM | POA: Insufficient documentation

## 2021-11-27 DIAGNOSIS — Z9221 Personal history of antineoplastic chemotherapy: Secondary | ICD-10-CM | POA: Insufficient documentation

## 2021-11-27 DIAGNOSIS — G309 Alzheimer's disease, unspecified: Secondary | ICD-10-CM | POA: Insufficient documentation

## 2021-11-27 DIAGNOSIS — I110 Hypertensive heart disease with heart failure: Secondary | ICD-10-CM | POA: Diagnosis not present

## 2021-11-27 NOTE — Progress Notes (Signed)
[name], [name] ([id number]) Visit Report for 11/27/2021 Chief Complaint Document Details Patient Name: Date of Service: [name], [name] 11/27/2021 8:00 A M Medical Record Number: [id number] Patient Account Number: [id number] Date of Birth/Sex: Treating RN: Jan 13, 1925 (88 y.o. Elam Dutch Primary Care Provider: Marda Stalker Other Clinician: Referring Provider: Treating Provider/Extender: Aviva Signs in Treatment: 0 Information Obtained from: Patient Chief Complaint Left lower extremity wound Electronic Signature(s) Signed: 11/27/2021 9:33:34 AM By: Kalman Shan DO Entered By: Kalman Shan on 11/27/2021 09:26:49 -------------------------------------------------------------------------------- Debridement Details Patient Name: Date of Service: [name], FLIPPEN [name] 11/27/2021 8:00 A M Medical Record Number: [id number] Patient Account Number: [id number] Date of Birth/Sex: Treating RN: Apr 30, 1925 (88 y.o. [name] Primary Care Provider: Marda Stalker Other Clinician: Referring Provider: Treating Provider/Extender: Marjo Bicker Weeks in Treatment: 0 Debridement Performed for Assessment: Wound #26 Left,Lateral Ankle Performed By: Physician Kalman Shan, DO Debridement Type: Debridement Severity of Tissue Pre Debridement: Fat layer exposed Level of Consciousness (Pre-procedure): Awake and Alert Pre-procedure Verification/Time Out Yes - 08:56 Taken: Start Time: 08:57 Pain Control: Other : Benzocaine 20% T Area Debrided (L x W): otal 4 (cm) x 3 (cm) = 12 (cm) Tissue and other material debrided: Non-Viable, Slough, Subcutaneous, Skin: Dermis , Slough Level: Skin/Subcutaneous Tissue Debridement Description: Excisional Instrument: Curette Bleeding: Minimum Hemostasis Achieved: Pressure End Time: 09:00 Response to Treatment: Procedure was tolerated well Level of Consciousness (Post- Awake and  Alert procedure): Post Debridement Measurements of Total Wound Length: (cm) 4 Width: (cm) 3 Depth: (cm) 0.1 Volume: (cm) 0.942 Character of Wound/Ulcer Post Debridement: Stable Severity of Tissue Post Debridement: Fat layer exposed Post Procedure Diagnosis Same as Pre-procedure Electronic Signature(s) Signed: 11/27/2021 9:33:34 AM By: Kalman Shan DO Signed: 11/27/2021 12:17:25 PM By: Lorrin Jackson Entered By: Lorrin Jackson on 11/27/2021 09:01:01 -------------------------------------------------------------------------------- HPI Details Patient Name: Date of Service: [name], ROSTON [name] 11/27/2021 8:00 A M Medical Record Number: [id number] Patient Account Number: [id number] Date of Birth/Sex: Treating RN: 04/20/1925 (88 y.o. Elam Dutch Primary Care Provider: Marda Stalker Other Clinician: Referring Provider: Treating Provider/Extender: Marjo Bicker Weeks in Treatment: 0 History of Present Illness HPI Description: this is a patient we know from several prior wounds on her bilateral lower extremities. She has venous stasis physiology, inflammation and hypertension. She is been fully evaluated for venous ablation and was found not to be a candidate. She does not have significant arterial disease. 02/14/15 the wound itself on her lateral left leg did not look too bad however there was surrounding maceration which was concerning 02/27/15 the wound itself is small with surrounding circumferential epithelialization there is no surrounding maceration. 03/06/15 only a very small area remains. I think this is mostly epithelialized however I would be uncomfortable not dressing this this week 03/13/15 the area is epithelialized. There was a thick surface on this. I took a #15 blade then lightly disrupted it but there does not appear to be any open area I did not see anything but appropriate epithelium Readmission: 08/16/18 on evaluation today patient presents for  initial evaluation and our clinic released readmission although she has not been seen since April 2016. Nonetheless she is having issues with the ulcers currently on the bilateral lateral malleolus or locations as well as the left lateral lower extremity. These have been present for roughly 3 weeks since the patient actually developed significant bilateral lower extremity edema which patient's daughter states was roughly 3 times the size of her legs currently. Subsequently she ended up in  the emerging department due to congestive heart failure. They were able to get the swelling under control and fortunately she seems to be doing much better at this point in time. She was placed in an [employer] which she sat on for week part evaluation today as well. Fortunately the wound areas actually seem to be doing fairly well there is some macerated skin surrounding there are the areas/surface of the wound which are gonna require some debridement at this point. No fevers, chills, nausea, or vomiting noted at this time. Patient has a history of hypertension, mild peripheral vascular disease, chronic venous stasis, and other than the ulcer seems to be doing fairly well today. No fevers, chills, nausea, or vomiting noted at this time. 08/23/18 on evaluation today patient actually appears to be doing great in regard to her bilateral lower should be ulcers. She's shown signs of improvement even just with one week with the [employer]. In general I'm actually very pleased with how things appear at this point. 09/06/18 on evaluation today patient actually appears to be doing much better her left lower extremity is completely healed. Go to the right lower extremity lateral malleolus ulcer this still seems to be showing signs of being open. There does not appear to be evidence of infection which is good news. This did require some sharp debridement however to remove some of the necrotic tissue/eschar on the  surface of the wound Readmission 11/27/2021 [name] is a 88 year old female with a past medical history of endometrial and breast cancer, chronic venous insufficiency, dementia and essential hypertension that presents to the clinic with a 1 month history of nonhealing ulcer to the left lower extremity. She states that the ulcer opened 1 month ago and then healed but has reopened again in the past week. She is currently keeping the area covered. She has a history of wounds to her lower extremities. She has compression stockings that she uses daily. She currently denies signs of infection. Electronic Signature(s) Signed: 11/27/2021 9:33:34 AM By: Kalman Shan DO Entered By: Kalman Shan on 11/27/2021 09:29:25 -------------------------------------------------------------------------------- Physical Exam Details Patient Name: Date of Service: CASSANDRE, OLEKSY [name] 11/27/2021 8:00 A M Medical Record Number: [id number] Patient Account Number: [id number] Date of Birth/Sex: Treating RN: [birth year] (88 y.o. Elam Dutch Primary Care Provider: Marda Stalker Other Clinician: Referring Provider: Treating Provider/Extender: Marjo Bicker Weeks in Treatment: 0 Constitutional respirations regular, non-labored and within target range for patient.. Cardiovascular 2+ dorsalis pedis/posterior tibialis pulses. Psychiatric pleasant and cooperative. Notes Left lower extremity: T the distal lateral aspect there is an open wound with nonviable tissue throughout. No surrounding signs of infection. Mild odor. No pain o reported. Electronic Signature(s) Signed: 11/27/2021 9:33:34 AM By: Kalman Shan DO Entered By: Kalman Shan on 11/27/2021 09:30:10 -------------------------------------------------------------------------------- Physician Orders Details Patient Name: Date of Service: KARIE, SKOWRON [name] 11/27/2021 8:00 A M Medical Record Number:  [id number] Patient Account Number: [id number] Date of Birth/Sex: Treating RN: [birth year] (88 y.o. [name] Primary Care Provider: Marda Stalker Other Clinician: Referring Provider: Treating Provider/Extender: Aviva Signs in Treatment: 0 Verbal / Phone Orders: No Diagnosis Coding Follow-up Appointments ppointment in 1 week. - Friday with Dr. Heber [place] Return A Nurse Visit: - Monday for wrap change Bathing/ Shower/ Hygiene May shower with protection but do not get wound dressing(s) wet. - May use cast protector bag. Can get from [facility] or [facility] Edema Control - Lymphedema / SCD / Other Elevate legs to the  level of the heart or above for 30 minutes daily and/or when sitting, a frequency of: Avoid standing for long periods of time. Patient to wear own compression stockings every day. - T right leg o Additional Orders / Instructions Follow Nutritious Diet - High Protein Diet Wound Treatment Wound #26 - Ankle Wound Laterality: Left, Lateral Cleanser: Soap and Water 1 x Per Week/30 Days Discharge Instructions: May shower and wash wound with dial antibacterial soap and water prior to dressing change. Peri-Wound Care: Triamcinolone 15 (g) 1 x Per Week/30 Days Discharge Instructions: Use triamcinolone 15 (g) as directed Peri-Wound Care: Sween Lotion (Moisturizing lotion) 1 x Per Week/30 Days Discharge Instructions: Apply moisturizing lotion as directed Topical: Gentamicin 1 x Per Week/30 Days Discharge Instructions: As directed by physician Prim Dressing: PolyMem Silver Non-Adhesive Dressing, 4.25x4.25 in 1 x Per Week/30 Days ary Discharge Instructions: Apply to wound bed as instructed Secondary Dressing: ABD Pad, 5x9 1 x Per Week/30 Days Discharge Instructions: Apply over primary dressing as directed. Compression Wrap: Kerlix Roll 4.5x3.1 (in/yd) 1 x Per Week/30 Days Discharge Instructions: Apply Kerlix and Coban compression as directed. Compression  Wrap: Coban Self-Adherent Wrap 4x5 (in/yd) 1 x Per Week/30 Days Discharge Instructions: Apply over Kerlix as directed. Electronic Signature(s) Signed: 11/27/2021 9:33:34 AM By: Kalman Shan DO Entered By: Kalman Shan on 11/27/2021 09:30:30 -------------------------------------------------------------------------------- Problem List Details Patient Name: Date of Service: FAUN, MCQUEEN [name] 11/27/2021 8:00 A M Medical Record Number: [id number] Patient Account Number: [id number] Date of Birth/Sex: Treating RN: Dec 26, 1924 (88 y.o. Martyn Malay, Linda Primary Care Provider: Marda Stalker Other Clinician: Referring Provider: Treating Provider/Extender: Aviva Signs in Treatment: 0 Active Problems ICD-10 Encounter Code Description Active Date MDM Diagnosis I87.312 Chronic venous hypertension (idiopathic) with ulcer of left lower extremity 11/27/2021 No Yes L97.822 Non-pressure chronic ulcer of other part of left lower leg with fat layer exposed1/04/2022 No Yes C50.919 Malignant neoplasm of unspecified site of unspecified female breast 11/27/2021 No Yes I50.42 Chronic combined systolic (congestive) and diastolic (congestive) heart failure 11/27/2021 No Yes G30.9 Alzheimer's disease, unspecified 11/27/2021 No Yes C54.1 Malignant neoplasm of endometrium 11/27/2021 No Yes Inactive Problems Resolved Problems Electronic Signature(s) Signed: 11/27/2021 9:33:34 AM By: Kalman Shan DO Entered By: Kalman Shan on 11/27/2021 09:26:26 -------------------------------------------------------------------------------- Progress Note Details Patient Name: Date of Service: SOUNDRA, LAMPLEY [name] 11/27/2021 8:00 A M Medical Record Number: [id number] Patient Account Number: [id number] Date of Birth/Sex: Treating RN: 01/08/1925 (88 y.o. Elam Dutch Primary Care Provider: Marda Stalker Other Clinician: Referring Provider: Treating Provider/Extender: Aviva Signs in Treatment: 0 Subjective Chief Complaint Information obtained from Patient Left lower extremity wound History of Present Illness (HPI) this is a patient we know from several prior wounds on her bilateral lower extremities. She has venous stasis physiology, inflammation and hypertension. She is been fully evaluated for venous ablation and was found not to be a candidate. She does not have significant arterial disease. 02/14/15 the wound itself on her lateral left leg did not look too bad however there was surrounding maceration which was concerning 02/27/15 the wound itself is small with surrounding circumferential epithelialization there is no surrounding maceration. 03/06/15 only a very small area remains. I think this is mostly epithelialized however I would be uncomfortable not dressing this this week 03/13/15 the area is epithelialized. There was a thick surface on this. I took a #15 blade then lightly disrupted it but there does not appear to be any open area I did not see anything but  appropriate epithelium Readmission: 08/16/18 on evaluation today patient presents for initial evaluation and our clinic released readmission although she has not been seen since April 2016. Nonetheless she is having issues with the ulcers currently on the bilateral lateral malleolus or locations as well as the left lateral lower extremity. These have been present for roughly 3 weeks since the patient actually developed significant bilateral lower extremity edema which patient's daughter states was roughly 3 times the size of her legs currently. Subsequently she ended up in the emerging department due to congestive heart failure. They were able to get the swelling under control and fortunately she seems to be doing much better at this point in time. She was placed in an [employer] which she sat on for week part evaluation today as well. Fortunately the wound areas actually seem  to be doing fairly well there is some macerated skin surrounding there are the areas/surface of the wound which are gonna require some debridement at this point. No fevers, chills, nausea, or vomiting noted at this time. Patient has a history of hypertension, mild peripheral vascular disease, chronic venous stasis, and other than the ulcer seems to be doing fairly well today. No fevers, chills, nausea, or vomiting noted at this time. 08/23/18 on evaluation today patient actually appears to be doing great in regard to her bilateral lower should be ulcers. She's shown signs of improvement even just with one week with the [employer]. In general I'm actually very pleased with how things appear at this point. 09/06/18 on evaluation today patient actually appears to be doing much better her left lower extremity is completely healed. Go to the right lower extremity lateral malleolus ulcer this still seems to be showing signs of being open. There does not appear to be evidence of infection which is good news. This did require some sharp debridement however to remove some of the necrotic tissue/eschar on the surface of the wound Readmission 11/27/2021 Ms. Caral Whan is a 88 year old female with a past medical history of endometrial and breast cancer, chronic venous insufficiency, dementia and essential hypertension that presents to the clinic with a 1 month history of nonhealing ulcer to the left lower extremity. She states that the ulcer opened 1 month ago and then healed but has reopened again in the past week. She is currently keeping the area covered. She has a history of wounds to her lower extremities. She has compression stockings that she uses daily. She currently denies signs of infection. Patient History Information obtained from Patient. Allergies No Known Drug Allergies Family History Cancer - Child, Hypertension - Child,Father, Kidney Disease - Child, Stroke - Siblings,Child, Thyroid  Problems - Child, No family history of Diabetes, Heart Disease, Hereditary Spherocytosis, Lung Disease, Seizures, Tuberculosis. Social History Never smoker, Marital Status - Widowed, Alcohol Use - Never, Drug Use - No History, Caffeine Use - Never. Medical History Eyes Denies history of Cataracts, Glaucoma, Optic Neuritis Ear/Nose/Mouth/Throat Denies history of Chronic sinus problems/congestion, Middle ear problems Hematologic/Lymphatic Patient has history of Anemia Denies history of Hemophilia, Human Immunodeficiency Virus, Lymphedema, Sickle Cell Disease Respiratory Denies history of Aspiration, Asthma, Chronic Obstructive Pulmonary Disease (COPD), Pneumothorax, Sleep Apnea, Tuberculosis Cardiovascular Patient has history of Congestive Heart Failure, Hypertension, Peripheral Venous Disease Denies history of Angina, Arrhythmia, Coronary Artery Disease, Hypotension, Myocardial Infarction, Peripheral Arterial Disease, Phlebitis, Vasculitis Gastrointestinal Denies history of Cirrhosis , Colitis, Crohnoos, Hepatitis A, Hepatitis B, Hepatitis C Endocrine Denies history of Type I Diabetes, Type II Diabetes Genitourinary  Denies history of End Stage Renal Disease Immunological Denies history of Lupus Erythematosus, Raynaudoos, Scleroderma Integumentary (Skin) Denies history of History of Burn Musculoskeletal Patient has history of Osteoarthritis Denies history of Gout, Rheumatoid Arthritis, Osteomyelitis Neurologic Denies history of Neuropathy, Quadriplegia, Paraplegia, Seizure Disorder Oncologic Patient has history of Received Chemotherapy - Breast and Ovarian Cancer Denies history of Received Radiation Hospitalization/Surgery History - diverticulits. Review of Systems (ROS) Eyes Complains or has symptoms of Glasses / Contacts. Genitourinary Denies complaints or symptoms of Frequent urination. Integumentary (Skin) Complains or has symptoms of Wounds - Left  Ankle. Psychiatric Denies complaints or symptoms of Claustrophobia, Suicidal. Objective Constitutional respirations regular, non-labored and within target range for patient.. Vitals Time Taken: 8:05 AM, Height: 66 in, Source: Stated, Weight: 176 lbs, Source: Stated, BMI: 28.4, Temperature: 97.9 F, Pulse: 64 bpm, Respiratory Rate: 18 breaths/min, Blood Pressure: 135/71 mmHg. Cardiovascular 2+ dorsalis pedis/posterior tibialis pulses. Psychiatric pleasant and cooperative. General Notes: Left lower extremity: T the distal lateral aspect there is an open wound with nonviable tissue throughout. No surrounding signs of infection. Mild o odor. No pain reported. Integumentary (Hair, Skin) Wound #26 status is Open. Original cause of wound was Gradually Appeared. The date acquired was: 11/20/2021. The wound is located on the Left,Lateral Ankle. The wound measures 4cm length x 3cm width x 0.1cm depth; 9.425cm^2 area and 0.942cm^3 volume. There is Fat Layer (Subcutaneous Tissue) exposed. There is no tunneling or undermining noted. There is a medium amount of serosanguineous drainage noted. The wound margin is distinct with the outline attached to the wound base. There is large (67-100%) red granulation within the wound bed. There is a small (1-33%) amount of necrotic tissue within the wound bed including Adherent Slough. Assessment Active Problems ICD-10 Chronic venous hypertension (idiopathic) with ulcer of left lower extremity Non-pressure chronic ulcer of other part of left lower leg with fat layer exposed Malignant neoplasm of unspecified site of unspecified female breast Chronic combined systolic (congestive) and diastolic (congestive) heart failure Alzheimer's disease, unspecified Malignant neoplasm of endometrium Patient presents with a 1 month history of ulcer to her left lower extremity and this appears related to her venous insufficiency. There is nonviable tissue throughout and this  was debrided. There was mild odor on exam and I recommended using gentamicin ointment along with PolyMem silver to help address any bioburden. There are no surrounding signs of infection. Her ABIs were 1.1 on the left. I recommended Kerlix/Coban. T assure there are no issues I o recommended a nurse visit on Monday for wrap change and then follow-up with me later in the week. Procedures Wound #26 Pre-procedure diagnosis of Wound #26 is a Venous Leg Ulcer located on the Left,Lateral Ankle .Severity of Tissue Pre Debridement is: Fat layer exposed. There was a Excisional Skin/Subcutaneous Tissue Debridement with a total area of 12 sq cm performed by Kalman Shan, DO. With the following instrument(s): Curette to remove Non-Viable tissue/material. Material removed includes Subcutaneous Tissue, Slough, and Skin: Dermis after achieving pain control using Other (Benzocaine 20%). No specimens were taken. A time out was conducted at 08:56, prior to the start of the procedure. A Minimum amount of bleeding was controlled with Pressure. The procedure was tolerated well. Post Debridement Measurements: 4cm length x 3cm width x 0.1cm depth; 0.942cm^3 volume. Character of Wound/Ulcer Post Debridement is stable. Severity of Tissue Post Debridement is: Fat layer exposed. Post procedure Diagnosis Wound #26: Same as Pre-Procedure Plan Follow-up Appointments: Return Appointment in 1 week. - Friday with Dr. Heber Hubbard Nurse  Visit: - Monday for wrap change Bathing/ Shower/ Hygiene: May shower with protection but do not get wound dressing(s) wet. - May use cast protector bag. Can get from [facility] or [facility] Edema Control - Lymphedema / SCD / Other: Elevate legs to the level of the heart or above for 30 minutes daily and/or when sitting, a frequency of: Avoid standing for long periods of time. Patient to wear own compression stockings every day. - T right leg o Additional Orders / Instructions: Follow Nutritious Diet  - High Protein Diet WOUND #26: - Ankle Wound Laterality: Left, Lateral Cleanser: Soap and Water 1 x Per Week/30 Days Discharge Instructions: May shower and wash wound with dial antibacterial soap and water prior to dressing change. Peri-Wound Care: Triamcinolone 15 (g) 1 x Per Week/30 Days Discharge Instructions: Use triamcinolone 15 (g) as directed Peri-Wound Care: Sween Lotion (Moisturizing lotion) 1 x Per Week/30 Days Discharge Instructions: Apply moisturizing lotion as directed Topical: Gentamicin 1 x Per Week/30 Days Discharge Instructions: As directed by physician Prim Dressing: PolyMem Silver Non-Adhesive Dressing, 4.25x4.25 in 1 x Per Week/30 Days ary Discharge Instructions: Apply to wound bed as instructed Secondary Dressing: ABD Pad, 5x9 1 x Per Week/30 Days Discharge Instructions: Apply over primary dressing as directed. Com pression Wrap: Kerlix Roll 4.5x3.1 (in/yd) 1 x Per Week/30 Days Discharge Instructions: Apply Kerlix and Coban compression as directed. Com pression Wrap: Coban Self-Adherent Wrap 4x5 (in/yd) 1 x Per Week/30 Days Discharge Instructions: Apply over Kerlix as directed. 1. In office sharp debridement 2. Gentamicin with PolyMem silver under Kerlix/Coban 3. Nurse visit and doctor visit next week Electronic Signature(s) Signed: 11/27/2021 9:33:34 AM By: Kalman Shan DO Entered By: Kalman Shan on 11/27/2021 09:32:46 -------------------------------------------------------------------------------- HxROS Details Patient Name: Date of Service: KALEIYAH, POLSKY [name] 11/27/2021 8:00 A M Medical Record Number: [id number] Patient Account Number: [id number] Date of Birth/Sex: Treating RN: 21-Jan-1925 (88 y.o. [name] Primary Care Provider: Marda Stalker Other Clinician: Referring Provider: Treating Provider/Extender: Aviva Signs in Treatment: 0 Information Obtained From Patient Eyes Complaints and Symptoms: Positive  for: Glasses / Contacts Medical History: Negative for: Cataracts; Glaucoma; Optic Neuritis Genitourinary Complaints and Symptoms: Negative for: Frequent urination Medical History: Negative for: End Stage Renal Disease Integumentary (Skin) Complaints and Symptoms: Positive for: Wounds - Left Ankle Medical History: Negative for: History of Burn Psychiatric Complaints and Symptoms: Negative for: Claustrophobia; Suicidal Ear/Nose/Mouth/Throat Medical History: Negative for: Chronic sinus problems/congestion; Middle ear problems Hematologic/Lymphatic Medical History: Positive for: Anemia Negative for: Hemophilia; Human Immunodeficiency Virus; Lymphedema; Sickle Cell Disease Respiratory Medical History: Negative for: Aspiration; Asthma; Chronic Obstructive Pulmonary Disease (COPD); Pneumothorax; Sleep Apnea; Tuberculosis Cardiovascular Medical History: Positive for: Congestive Heart Failure; Hypertension; Peripheral Venous Disease Negative for: Angina; Arrhythmia; Coronary Artery Disease; Hypotension; Myocardial Infarction; Peripheral Arterial Disease; Phlebitis; Vasculitis Gastrointestinal Medical History: Negative for: Cirrhosis ; Colitis; Crohns; Hepatitis A; Hepatitis B; Hepatitis C Endocrine Medical History: Negative for: Type I Diabetes; Type II Diabetes Immunological Medical History: Negative for: Lupus Erythematosus; Raynauds; Scleroderma Musculoskeletal Medical History: Positive for: Osteoarthritis Negative for: Gout; Rheumatoid Arthritis; Osteomyelitis Neurologic Medical History: Negative for: Neuropathy; Quadriplegia; Paraplegia; Seizure Disorder Oncologic Medical History: Positive for: Received Chemotherapy - Breast and Ovarian Cancer Negative for: Received Radiation Immunizations Pneumococcal Vaccine: Received Pneumococcal Vaccination: Yes Received Pneumococcal Vaccination On or After 60th Birthday: Yes Implantable Devices None Hospitalization / Surgery  History Type of Hospitalization/Surgery diverticulits Family and Social History Cancer: Yes - Child; Diabetes: No; Heart Disease: No; Hereditary Spherocytosis: No; Hypertension: Yes -  Child,Father; Kidney Disease: Yes - Child; Lung Disease: No; Seizures: No; Stroke: Yes - Siblings,Child; Thyroid Problems: Yes - Child; Tuberculosis: No; Never smoker; Marital Status - Widowed; Alcohol Use: Never; Drug Use: No History; Caffeine Use: Never; Financial Concerns: No; Food, Clothing or Shelter Needs: No; Support System Lacking: No; Transportation Concerns: No Electronic Signature(s) Signed: 11/27/2021 9:33:34 AM By: Kalman Shan DO Signed: 11/27/2021 12:17:25 PM By: Lorrin Jackson Entered By: Lorrin Jackson on 11/27/2021 08:11:22 -------------------------------------------------------------------------------- SuperBill Details Patient Name: Date of Service: KALLE, BERNATH [name] 11/27/2021 Medical Record Number: [id number] Patient Account Number: [id number] Date of Birth/Sex: Treating RN: 01-Oct-1925 (88 y.o. [name] Primary Care Provider: Marda Stalker Other Clinician: Referring Provider: Treating Provider/Extender: Marjo Bicker Weeks in Treatment: 0 Diagnosis Coding ICD-10 Codes Code Description [phone] Chronic venous hypertension (idiopathic) with ulcer of left lower extremity L97.822 Non-pressure chronic ulcer of other part of left lower leg with fat layer exposed C50.919 Malignant neoplasm of unspecified site of unspecified female breast I50.42 Chronic combined systolic (congestive) and diastolic (congestive) heart failure G30.9 Alzheimer's disease, unspecified C54.1 Malignant neoplasm of endometrium Facility Procedures CPT4 Code: [id number] Description: 08144 - WOUND CARE VISIT-LEV 3 EST PT Modifier: 25 Quantity: 1 CPT4 Code: [id number] Description: 97026 - DEB SUBQ TISSUE 20 SQ CM/< ICD-10 Diagnosis Description L97.822 Non-pressure chronic ulcer of  other part of left lower leg with fat layer expos I87.312 Chronic venous hypertension (idiopathic) with ulcer of left lower extremity Modifier: ed Quantity: 1 Physician Procedures : CPT4 Code Description Modifier [id number] - WC PHYS LEVEL 4 - NEW PT ICD-10 Diagnosis Description I87.312 Chronic venous hypertension (idiopathic) with ulcer of left lower extremity L97.822 Non-pressure chronic ulcer of other part of left lower leg  with fat layer exposed I50.42 Chronic combined systolic (congestive) and diastolic (congestive) heart failure G30.9 Alzheimer's disease, unspecified Quantity: 1 Electronic Signature(s) Signed: 11/27/2021 9:33:34 AM By: Kalman Shan DO Previous Signature: 11/27/2021 9:32:21 AM Version By: Lorrin Jackson Entered By: Kalman Shan on 11/27/2021 09:33:08

## 2021-11-27 NOTE — Progress Notes (Signed)
[name] ([id number]) Visit Report for 11/27/2021 Abuse/Suicide Risk Screen Details Patient Name: Date of Service: [name], [name] 11/27/2021 8:00 A M Medical Record Number: [id number] Patient Account Number: [id number] Date of Birth/Sex: Treating RN: 1925/09/13 (88 y.o. [name] Primary Care Amous Crewe: Marda Stalker Other Clinician: Referring Jelesa Mangini: Treating Wrenn Willcox/Extender: Marjo Bicker Weeks in Treatment: 0 Abuse/Suicide Risk Screen Items Answer ABUSE RISK SCREEN: Has anyone close to you tried to hurt or harm you recentlyo No Do you feel uncomfortable with anyone in your familyo No Has anyone forced you do things that you didnt want to doo No Electronic Signature(s) Signed: 11/27/2021 12:17:25 PM By: Lorrin Jackson Entered By: Lorrin Jackson on 11/27/2021 08:11:31 -------------------------------------------------------------------------------- Activities of Daily Living Details Patient Name: Date of Service: [name] 11/27/2021 8:00 A M Medical Record Number: [id number] Patient Account Number: [id number] Date of Birth/Sex: Treating RN: 1925-05-16 (88 y.o. [name] Primary Care Ma Munoz: Marda Stalker Other Clinician: Referring Takota Cahalan: Treating Naylani Bradner/Extender: Aviva Signs in Treatment: 0 Activities of Daily Living Items Answer Activities of Daily Living (Please select one for each item) Drive Automobile Not Able T Medications ake Need Assistance Use T elephone Need Assistance Care for Appearance Need Assistance Use T oilet Need Assistance Bath / Shower Need Assistance Dress Self Need Assistance Feed Self Completely Able Walk Need Assistance Get In / Out Bed Need Assistance Housework Need Assistance Prepare Meals Need Assistance Handle Money Need Assistance Shop for Self Need Assistance Electronic Signature(s) Signed: 11/27/2021 12:17:25 PM By: Lorrin Jackson Entered  By: Lorrin Jackson on 11/27/2021 08:12:17 -------------------------------------------------------------------------------- Education Screening Details Patient Name: Date of Service: [name] 11/27/2021 8:00 A M Medical Record Number: [id number] Patient Account Number: [id number] Date of Birth/Sex: Treating RN: [birth year] (88 y.o. [name] Primary Care Paulita Licklider: Marda Stalker Other Clinician: Referring Kayn Haymore: Treating Ramiya Delahunty/Extender: Aviva Signs in Treatment: 0 Primary Learner Assessed: Caregiver Reason Patient is not Primary Learner: Memory Issues Learning Preferences/Education Level/Primary Language Learning Preference: Explanation, Demonstration, Printed Material Highest Education Level: Grade School Preferred Language: English Cognitive Barrier Language Barrier: No Translator Needed: No Memory Deficit: Yes Emotional Barrier: No Cultural/Religious Beliefs Affecting Medical Care: No Physical Barrier Impaired Vision: Yes Glasses Impaired Hearing: No Decreased Hand dexterity: No Knowledge/Comprehension Knowledge Level: High Comprehension Level: High Ability to understand written instructions: High Ability to understand verbal instructions: High Motivation Anxiety Level: Calm Cooperation: Cooperative Education Importance: Acknowledges Need Interest in Health Problems: Asks Questions Perception: Coherent Willingness to Engage in Self-Management High Activities: Readiness to Engage in Self-Management High Activities: Notes Daughter is main caregiver Engineer, maintenance) Signed: 11/27/2021 12:17:25 PM By: Lorrin Jackson Entered By: Lorrin Jackson on 11/27/2021 08:13:20 -------------------------------------------------------------------------------- Fall Risk Assessment Details Patient Name: Date of Service: [name] 11/27/2021 8:00 [place] Record Number: [id number] Patient Account Number:  [id number] Date of Birth/Sex: Treating RN: 08/12/1925 (88 y.o. [name] Primary Care Cassey Hurrell: Marda Stalker Other Clinician: Referring Wakeelah Solan: Treating Arielis Leonhart/Extender: Aviva Signs in Treatment: 0 Fall Risk Assessment Items Have you had 2 or more falls in the last 12 monthso 0 No Have you had any fall that resulted in injury in the last 12 monthso 0 No FALLS RISK SCREEN History of falling - immediate or within 3 months 0 No Secondary diagnosis (Do you have 2 or more medical diagnoseso) 15 Yes Ambulatory aid None/bed rest/wheelchair/nurse 0 No Crutches/cane/walker 15 Yes Furniture 0 No Intravenous therapy Access/Saline/Heparin Lock 0 No Gait/Transferring Normal/ bed rest/  wheelchair 0 No Weak (short steps with or without shuffle, stooped but able to lift head while walking, may seek 10 Yes support from furniture) Impaired (short steps with shuffle, may have difficulty arising from chair, head down, impaired 0 No balance) Mental Status Oriented to own ability 0 Yes Electronic Signature(s) Signed: 11/27/2021 12:17:25 PM By: Lorrin Jackson Entered By: Lorrin Jackson on 11/27/2021 08:13:32 -------------------------------------------------------------------------------- Foot Assessment Details Patient Name: Date of Service: [name] 11/27/2021 8:00 A M Medical Record Number: [id number] Patient Account Number: [id number] Date of Birth/Sex: Treating RN: August 03, 1925 (88 y.o. [name] Primary Care Tandrea Kommer: Marda Stalker Other Clinician: Referring Abdalrahman Clementson: Treating Marielena Harvell/Extender: Marjo Bicker Weeks in Treatment: 0 Foot Assessment Items Site Locations + = Sensation present, - = Sensation absent, C = Callus, U = Ulcer R = Redness, W = Warmth, M = Maceration, PU = Pre-ulcerative lesion F = Fissure, S = Swelling, D = Dryness Assessment Right: Left: Other Deformity: No No Prior Foot Ulcer:  No No Prior Amputation: No No Charcot Joint: No No Ambulatory Status: Ambulatory With Help Assistance Device: Walker Gait: Steady Electronic Signature(s) Signed: 11/27/2021 12:17:25 PM By: Lorrin Jackson Entered By: Lorrin Jackson on 11/27/2021 08:19:41 -------------------------------------------------------------------------------- Nutrition Risk Screening Details Patient Name: Date of Service: CARLOYN, LAHUE [name] 11/27/2021 8:00 A M Medical Record Number: [id number] Patient Account Number: [id number] Date of Birth/Sex: Treating RN: August 30, 1925 (88 y.o. [name] Primary Care Strider Vallance: Marda Stalker Other Clinician: Referring Stefen Juba: Treating Mayanna Garlitz/Extender: Marjo Bicker Weeks in Treatment: 0 Height (in): 66 Weight (lbs): 176 Body Mass Index (BMI): 28.4 Nutrition Risk Screening Items Score Screening NUTRITION RISK SCREEN: I have an illness or condition that made me change the kind and/or amount of food I eat 0 No I eat fewer than two meals per day 3 Yes I eat few fruits and vegetables, or milk products 0 No I have three or more drinks of beer, liquor or wine almost every day 0 No I have tooth or mouth problems that make it hard for me to eat 0 No I don't always have enough money to buy the food I need 0 No I eat alone most of the time 0 No I take three or more different prescribed or over-the-counter drugs a day 1 Yes Without wanting to, I have lost or gained 10 pounds in the last six months 2 Yes I am not always physically able to shop, cook and/or feed myself 0 No Nutrition Protocols Good Risk Protocol Moderate Risk Protocol High Risk Proctocol 0 Provide education on nutrition Risk Level: High Risk Score: 6 Electronic Signature(s) Signed: 11/27/2021 12:17:25 PM By: Lorrin Jackson Entered By: Lorrin Jackson on 11/27/2021 08:14:13

## 2021-11-27 NOTE — Progress Notes (Signed)
DILLIE, BURANDT ([id number]) Visit Report for 11/27/2021 Allergy List Details Patient Name: Date of Service: [name], [name] 11/27/2021 8:00 A M Medical Record Number: [id number] Patient Account Number: [id number] Date of Birth/Sex: Treating RN: 1925-02-10 (88 y.o. [name] Primary Care Sung R[place]: Marda Stalker Other Clinician: Referring Lazar Tierce: Treating Masyn Rostro/Extender: Marjo Bicker Weeks in Treatment: 0 Allergies Active Allergies No Known Drug Allergies Active: 02/27/2015 Allergy Notes Electronic Signature(s) Signed: 11/27/2021 12:17:25 PM By: Lorrin Jackson Entered By: Lorrin Jackson on 11/27/2021 08:08:35 -------------------------------------------------------------------------------- Arrival Information Details Patient Name: Date of Service: [name], [name] 11/27/2021 8:00 A M Medical Record Number: [id number] Patient Account Number: [id number] Date of Birth/Sex: Treating RN: 1925-09-25 (88 y.o. [name] Primary Care Courage Biglow: Marda Stalker Other Clinician: Referring Sadaf Przybysz: Treating Avraj Lindroth/Extender: Aviva Signs in Treatment: 0 Visit Information Patient Arrived: Wheel Chair Arrival Time: 08:02 Accompanied By: Daughter Transfer Assistance: Manual Patient Identification Verified: Yes Secondary Verification Process Completed: Yes Patient Requires Transmission-Based Precautions: No Patient Has Alerts: No History Since Last Visit Added or deleted any medications: No Any new allergies or adverse reactions: No Had a fall or experienced change in activities of daily living that may affect risk of falls: No Signs or symptoms of abuse/neglect since last visito No Hospitalized since last visit: No Implantable device outside of the clinic excluding cellular tissue based products placed in the center since last visit: No Has Dressing in Place as Prescribed: Yes Pain Present Now: Yes Electronic  Signature(s) Signed: 11/27/2021 12:17:25 PM By: Lorrin Jackson Entered By: Lorrin Jackson on 11/27/2021 08:05:48 -------------------------------------------------------------------------------- Clinic Level of Care Assessment Details Patient Name: Date of Service: [name], [name] 11/27/2021 8:00 A M Medical Record Number: [id number] Patient Account Number: [id number] Date of Birth/Sex: Treating RN: 26-Mar-1925 (88 y.o. [name] Primary Care Andrianna Manalang: Marda Stalker Other Clinician: Referring Shaleah Nissley: Treating Annakate Soulier/Extender: Aviva Signs in Treatment: 0 Clinic Level of Care Assessment Items TOOL 1 Quantity Score X- 1 0 Use when EandM and Procedure is performed on INITIAL visit ASSESSMENTS - Nursing Assessment / Reassessment X- 1 20 General Physical Exam (combine w/ comprehensive assessment (listed just below) when performed on new pt. evals) X- 1 25 Comprehensive Assessment (HX, ROS, Risk Assessments, Wounds Hx, etc.) ASSESSMENTS - Wound and Skin Assessment / Reassessment []  - 0 Dermatologic / Skin Assessment (not related to wound area) ASSESSMENTS - Ostomy and/or Continence Assessment and Care []  - 0 Incontinence Assessment and Management []  - 0 Ostomy Care Assessment and Management (repouching, etc.) PROCESS - Coordination of Care []  - 0 Simple Patient / Family Education for ongoing care X- 1 20 Complex (extensive) Patient / Family Education for ongoing care X- 1 10 Staff obtains Programmer, systems, Records, T Results / Process Orders est X- 1 10 Staff telephones HHA, Nursing Homes / Clarify orders / etc []  - 0 Routine Transfer to another Facility (non-emergent condition) []  - 0 Routine Hospital Admission (non-emergent condition) []  - 0 New Admissions / Biomedical engineer / Ordering NPWT Apligraf, etc. , []  - 0 Emergency Hospital Admission (emergent condition) PROCESS - Special Needs []  - 0 Pediatric / Minor Patient  Management []  - 0 Isolation Patient Management []  - 0 Hearing / Language / Visual special needs []  - 0 Assessment of Community assistance (transportation, D/C planning, etc.) []  - 0 Additional assistance / Altered mentation []  - 0 Support Surface(s) Assessment (bed, cushion, seat, etc.) INTERVENTIONS - Miscellaneous []  - 0 External ear exam []  - 0 Patient Transfer (multiple staff /  Hoyer Lift / Similar devices) []  - 0 Simple Staple / Suture removal (25 or less) []  - 0 Complex Staple / Suture removal (26 or more) []  - 0 Hypo/Hyperglycemic Management (do not check if billed separately) X- 1 15 Ankle / Brachial Index (ABI) - do not check if billed separately Has the patient been seen at the hospital within the last three years: Yes Total Score: 100 Level Of Care: New/Established - Level 3 Electronic Signature(s) Signed: 11/27/2021 12:17:25 PM By: Lorrin Jackson Signed: 11/27/2021 12:17:25 PM By: Lorrin Jackson Entered By: Lorrin Jackson on 11/27/2021 09:08:04 -------------------------------------------------------------------------------- Encounter Discharge Information Details Patient Name: Date of Service: [name], [name] 11/27/2021 8:00 A M Medical Record Number: [id number] Patient Account Number: [id number] Date of Birth/Sex: Treating RN: March 05, 1925 (88 y.o. [name] Primary Care Jesica Goheen: Marda Stalker Other Clinician: Referring Rosilyn Coachman: Treating Megha Agnes/Extender: Aviva Signs in Treatment: 0 Encounter Discharge Information Items Post Procedure Vitals Discharge Condition: Stable Temperature (F): 97.9 Ambulatory Status: Wheelchair Pulse (bpm): 64 Discharge Destination: Home Respiratory Rate (breaths/min): 18 Transportation: Private Auto Blood Pressure (mmHg): 135/71 Accompanied By: daughter Schedule Follow-up Appointment: Yes Clinical Summary of Care: Provided on 11/27/2021 Form Type Recipient Paper Patient  Patient Electronic Signature(s) Signed: 11/27/2021 12:17:25 PM By: Lorrin Jackson Entered By: Lorrin Jackson on 11/27/2021 09:09:20 -------------------------------------------------------------------------------- Lower Extremity Assessment Details Patient Name: Date of Service: [name], [name] 11/27/2021 8:00 A M Medical Record Number: [id number] Patient Account Number: [id number] Date of Birth/Sex: Treating RN: Feb 09, 1925 (88 y.o. [name] Primary Care Giovannie Scerbo: Marda Stalker Other Clinician: Referring Itzae Mccurdy: Treating Agapito Hanway/Extender: Marjo Bicker Weeks in Treatment: 0 Edema Assessment Assessed: [name]: Yes] Patrice Paradise: No] Edema: [Left: Ye] [Right: s] Calf Left: Right: Point of Measurement: 30 cm From Medial Instep 36.5 cm Ankle Left: Right: Point of Measurement: 11 cm From Medial Instep 22.5 cm Knee To Floor Left: Right: From Medial Instep 37 cm Vascular Assessment Pulses: Dorsalis Pedis Palpable: [Left:Yes] Blood Pressure: Brachial: [Left:135] Ankle: [Left:Dorsalis Pedis: 150 1.11] Electronic Signature(s) Signed: 11/27/2021 12:17:25 PM By: Lorrin Jackson Entered By: Lorrin Jackson on 11/27/2021 08:27:41 -------------------------------------------------------------------------------- Multi Wound Chart Details Patient Name: Date of Service: [name], [name] 11/27/2021 8:00 A M Medical Record Number: [id number] Patient Account Number: [id number] Date of Birth/Sex: Treating RN: 28-Sep-1925 (88 y.o. Elam Dutch Primary Care Abishai Viegas: Marda Stalker Other Clinician: Referring Gia Lusher: Treating Lindsey Demonte/Extender: Marjo Bicker Weeks in Treatment: 0 Vital Signs Height(in): 66 Pulse(bpm): 93 Weight(lbs): 176 Blood Pressure(mmHg): 135/71 Body Mass Index(BMI): 28 Temperature(F): 97.9 Respiratory Rate(breaths/min): 18 Photos: [N/A:N/A] Left, Lateral Ankle N/A N/A Wound Location: Gradually Appeared  N/A N/A Wounding Event: Venous Leg Ulcer N/A N/A Primary Etiology: Anemia, Congestive Heart Failure, N/A N/A Comorbid History: Hypertension, Peripheral Venous Disease, Osteoarthritis, Received Chemotherapy 11/20/2021 N/A N/A Date Acquired: 0 N/A N/A Weeks of Treatment: Open N/A N/A Wound Status: 4x3x0.1 N/A N/A Measurements L x W x D (cm) 9.425 N/A N/A A (cm) : rea 0.942 N/A N/A Volume (cm) : 0.00% N/A N/A % Reduction in A rea: 0.00% N/A N/A % Reduction in Volume: Full Thickness Without Exposed N/A N/A Classification: Support Structures Medium N/A N/A Exudate A mount: Serosanguineous N/A N/A Exudate Type: red, brown N/A N/A Exudate Color: Distinct, outline attached N/A N/A Wound Margin: Large (67-100%) N/A N/A Granulation A mount: Red N/A N/A Granulation Quality: Small (1-33%) N/A N/A Necrotic A mount: Fat Layer (Subcutaneous Tissue): Yes N/A N/A Exposed Structures: Fascia: No Tendon: No Muscle: No Joint: No Bone: No None N/A  N/A Epithelialization: Debridement - Excisional N/A N/A Debridement: Pre-procedure Verification/Time Out 08:56 N/A N/A Taken: Other N/A N/A Pain Control: Subcutaneous, Slough N/A N/A Tissue Debrided: Skin/Subcutaneous Tissue N/A N/A Level: 12 N/A N/A Debridement A (sq cm): rea Curette N/A N/A Instrument: Minimum N/A N/A Bleeding: Pressure N/A N/A Hemostasis A chieved: Procedure was tolerated well N/A N/A Debridement Treatment Response: 4x3x0.1 N/A N/A Post Debridement Measurements L x W x D (cm) 0.942 N/A N/A Post Debridement Volume: (cm) Debridement N/A N/A Procedures Performed: Treatment Notes Wound #26 (Ankle) Wound Laterality: Left, Lateral Cleanser Soap and Water Discharge Instruction: May shower and wash wound with dial antibacterial soap and water prior to dressing change. Peri-Wound Care Topical Gentamicin Discharge Instruction: As directed by physician Primary Dressing PolyMem Silver  Non-Adhesive Dressing, 4.25x4.25 in Discharge Instruction: Apply to wound bed as instructed Secondary Dressing ABD Pad, 5x9 Discharge Instruction: Apply over primary dressing as directed. Secured With Compression Wrap Kerlix Roll 4.5x3.1 (in/yd) Discharge Instruction: Apply Kerlix and Coban compression as directed. Coban Self-Adherent Wrap 4x5 (in/yd) Discharge Instruction: Apply over Kerlix as directed. Compression Stockings Add-Ons Electronic Signature(s) Signed: 11/27/2021 9:33:34 AM By: Kalman Shan DO Signed: 11/27/2021 11:36:31 AM By: Baruch Gouty RN, BSN Entered By: Kalman Shan on 11/27/2021 09:26:36 -------------------------------------------------------------------------------- Multi-Disciplinary Care Plan Details Patient Name: Date of Service: [name], [name] 11/27/2021 8:00 A M Medical Record Number: [id number] Patient Account Number: [id number] Date of Birth/Sex: Treating RN: 1925-11-04 (88 y.o. [name] Primary Care Kace Hartje: Marda Stalker Other Clinician: Referring Ammar Moffatt: Treating Gae Bihl/Extender: Aviva Signs in Treatment: 0 Active Inactive Nutrition Nursing Diagnoses: Imbalanced nutrition Goals: Patient/caregiver agrees to and verbalizes understanding of need to use nutritional supplements and/or vitamins as prescribed Date Initiated: 11/27/2021 Target Resolution Date: 12/25/2021 Goal Status: Active Interventions: Assess patient nutrition upon admission and as needed per policy Provide education on nutrition Notes: Wound/Skin Impairment Nursing Diagnoses: Impaired tissue integrity Goals: Patient/caregiver will verbalize understanding of skin care regimen Date Initiated: 11/27/2021 Target Resolution Date: 12/25/2021 Goal Status: Active Ulcer/skin breakdown will have a volume reduction of 30% by week 4 Date Initiated: 11/27/2021 Target Resolution Date: 12/25/2021 Goal Status: Active Interventions: Assess  patient/caregiver ability to obtain necessary supplies Assess patient/caregiver ability to perform ulcer/skin care regimen upon admission and as needed Assess ulceration(s) every visit Provide education on ulcer and skin care Treatment Activities: Topical wound management initiated : 11/27/2021 Notes: Electronic Signature(s) Signed: 11/27/2021 8:36:26 AM By: Lorrin Jackson Entered By: Lorrin Jackson on 11/27/2021 08:36:25 -------------------------------------------------------------------------------- Pain Assessment Details Patient Name: Date of Service: [name], [name] 11/27/2021 8:00 A M Medical Record Number: [id number] Patient Account Number: [id number] Date of Birth/Sex: Treating RN: [birth year] (88 y.o. [name] Primary Care Borden Thune: Marda Stalker Other Clinician: Referring Kaide Gage: Treating Heath Tesler/Extender: Marjo Bicker Weeks in Treatment: 0 Active Problems Location of Pain Severity and Description of Pain Patient Has Paino Yes Site Locations Pain Location: Pain Location: Pain in Ulcers With Dressing Change: Yes Duration of the Pain. Constant / Intermittento Intermittent Rate the pain. Current Pain Level: 4 Character of Pain Describe the Pain: Tender Pain Management and Medication Current Pain Management: Medication: Yes Cold Application: No Rest: Yes Massage: No Activity: No T.E.N.S.: No Heat Application: No Leg drop or elevation: No Is the Current Pain Management Adequate: Adequate How does your wound impact your activities of daily livingo Sleep: No Bathing: No Appetite: No Relationship With Others: No Bladder Continence: No Emotions: No Bowel Continence: No Work: No Toileting: No Drive: No Dressing: No Hobbies:  No Electronic Signature(s) Signed: 11/27/2021 12:17:25 PM By: Lorrin Jackson Entered By: Lorrin Jackson on 11/27/2021  08:14:51 -------------------------------------------------------------------------------- Patient/Caregiver Education Details Patient Name: Date of Service: [name] 1/6/2023andnbsp8:00 A M Medical Record Number: [id number] Patient Account Number: [id number] Date of Birth/Gender: Treating RN: 1925/02/28 (88 y.o. [name] Primary Care Physician: Marda Stalker Other Clinician: Referring Physician: Treating Physician/Extender: Aviva Signs in Treatment: 0 Education Assessment Education Provided To: Patient and Caregiver Education Topics Provided Nutrition: Methods: Explain/Verbal, Printed Responses: State content correctly Wound/Skin Impairment: Methods: Demonstration, Explain/Verbal, Printed Responses: State content correctly Electronic Signature(s) Signed: 11/27/2021 12:17:25 PM By: Lorrin Jackson Entered By: Lorrin Jackson on 11/27/2021 08:36:57 -------------------------------------------------------------------------------- Wound Assessment Details Patient Name: Date of Service: [name], GLANZER [name] 11/27/2021 8:00 A M Medical Record Number: [id number] Patient Account Number: [id number] Date of Birth/Sex: Treating RN: 1925/03/25 (88 y.o. [name] Primary Care Nyrah Demos: Marda Stalker Other Clinician: Referring Legion Discher: Treating Achillies Buehl/Extender: Marjo Bicker Weeks in Treatment: 0 Wound Status Wound Number: 26 Primary Venous Leg Ulcer Etiology: Wound Location: Left, Lateral Ankle Wound Open Wounding Event: Gradually Appeared Status: Date Acquired: 11/20/2021 Comorbid Anemia, Congestive Heart Failure, Hypertension, Peripheral Weeks Of Treatment: 0 History: Venous Disease, Osteoarthritis, Received Chemotherapy Clustered Wound: No Photos Wound Measurements Length: (cm) 4 Width: (cm) 3 Depth: (cm) 0.1 Area: (cm) 9.425 Volume: (cm) 0.942 % Reduction in Area: 0% % Reduction in  Volume: 0% Epithelialization: None Tunneling: No Undermining: No Wound Description Classification: Full Thickness Without Exposed Support Structures Wound Margin: Distinct, outline attached Exudate Amount: Medium Exudate Type: Serosanguineous Exudate Color: red, brown Foul Odor After Cleansing: No Slough/Fibrino Yes Wound Bed Granulation Amount: Large (67-100%) Exposed Structure Granulation Quality: Red Fascia Exposed: No Necrotic Amount: Small (1-33%) Fat Layer (Subcutaneous Tissue) Exposed: Yes Necrotic Quality: Adherent Slough Tendon Exposed: No Muscle Exposed: No Joint Exposed: No Bone Exposed: No Treatment Notes Wound #26 (Ankle) Wound Laterality: Left, Lateral Cleanser Soap and Water Discharge Instruction: May shower and wash wound with dial antibacterial soap and water prior to dressing change. Peri-Wound Care Topical Gentamicin Discharge Instruction: As directed by physician Primary Dressing PolyMem Silver Non-Adhesive Dressing, 4.25x4.25 in Discharge Instruction: Apply to wound bed as instructed Secondary Dressing ABD Pad, 5x9 Discharge Instruction: Apply over primary dressing as directed. Secured With Compression Wrap Kerlix Roll 4.5x3.1 (in/yd) Discharge Instruction: Apply Kerlix and Coban compression as directed. Coban Self-Adherent Wrap 4x5 (in/yd) Discharge Instruction: Apply over Kerlix as directed. Compression Stockings Add-Ons Electronic Signature(s) Signed: 11/27/2021 12:17:25 PM By: Lorrin Jackson Entered By: Lorrin Jackson on 11/27/2021 08:23:55 -------------------------------------------------------------------------------- Vitals Details Patient Name: Date of Service: FLOR[place], PAOLILLO [name] 11/27/2021 8:00 A M Medical Record Number: [id number] Patient Account Number: [id number] Date of Birth/Sex: Treating RN: [birth year] (88 y.o. [name] Primary Care Tiyon Sanor: Marda Stalker Other Clinician: Referring Lindwood Mogel: Treating  Allie Ousley/Extender: Marjo Bicker Weeks in Treatment: 0 Vital Signs Time Taken: 08:05 Temperature (F): 97.9 Height (in): 66 Pulse (bpm): 64 Source: Stated Respiratory Rate (breaths/min): 18 Weight (lbs): 176 Blood Pressure (mmHg): 135/71 Source: Stated Reference Range: 80 - 120 mg / dl Body Mass Index (BMI): 28.4 Electronic Signature(s) Signed: 11/27/2021 12:17:25 PM By: Lorrin Jackson Entered By: Lorrin Jackson on 11/27/2021 76:72:09

## 2021-11-30 ENCOUNTER — Encounter (HOSPITAL_BASED_OUTPATIENT_CLINIC_OR_DEPARTMENT_OTHER): Payer: Medicare PPO | Admitting: Internal Medicine

## 2021-11-30 ENCOUNTER — Ambulatory Visit: Payer: Medicare PPO | Admitting: Cardiology

## 2021-11-30 ENCOUNTER — Other Ambulatory Visit: Payer: Self-pay

## 2021-11-30 ENCOUNTER — Encounter: Payer: Self-pay | Admitting: Cardiology

## 2021-11-30 VITALS — BP 118/63 | HR 64 | Resp 16 | Ht 66.0 in | Wt 166.0 lb

## 2021-11-30 DIAGNOSIS — I10 Essential (primary) hypertension: Secondary | ICD-10-CM

## 2021-11-30 DIAGNOSIS — I5032 Chronic diastolic (congestive) heart failure: Secondary | ICD-10-CM | POA: Diagnosis not present

## 2021-11-30 DIAGNOSIS — I87312 Chronic venous hypertension (idiopathic) with ulcer of left lower extremity: Secondary | ICD-10-CM | POA: Diagnosis not present

## 2021-11-30 DIAGNOSIS — L97822 Non-pressure chronic ulcer of other part of left lower leg with fat layer exposed: Secondary | ICD-10-CM | POA: Diagnosis not present

## 2021-11-30 DIAGNOSIS — I429 Cardiomyopathy, unspecified: Secondary | ICD-10-CM | POA: Diagnosis not present

## 2021-11-30 DIAGNOSIS — G309 Alzheimer's disease, unspecified: Secondary | ICD-10-CM | POA: Diagnosis not present

## 2021-11-30 DIAGNOSIS — I739 Peripheral vascular disease, unspecified: Secondary | ICD-10-CM | POA: Diagnosis not present

## 2021-11-30 DIAGNOSIS — I5042 Chronic combined systolic (congestive) and diastolic (congestive) heart failure: Secondary | ICD-10-CM | POA: Diagnosis not present

## 2021-11-30 DIAGNOSIS — C50919 Malignant neoplasm of unspecified site of unspecified female breast: Secondary | ICD-10-CM | POA: Diagnosis not present

## 2021-11-30 DIAGNOSIS — I872 Venous insufficiency (chronic) (peripheral): Secondary | ICD-10-CM | POA: Diagnosis not present

## 2021-11-30 DIAGNOSIS — Z853 Personal history of malignant neoplasm of breast: Secondary | ICD-10-CM | POA: Diagnosis not present

## 2021-11-30 DIAGNOSIS — I110 Hypertensive heart disease with heart failure: Secondary | ICD-10-CM | POA: Diagnosis not present

## 2021-11-30 DIAGNOSIS — Z86718 Personal history of other venous thrombosis and embolism: Secondary | ICD-10-CM

## 2021-11-30 DIAGNOSIS — C541 Malignant neoplasm of endometrium: Secondary | ICD-10-CM | POA: Diagnosis not present

## 2021-11-30 NOTE — Progress Notes (Signed)
[name], [name] ([id number]) Visit Report for 11/30/2021 Arrival Information Details Patient Name: Date of Service: [name], [name] 11/30/2021 1:30 PM Medical Record Number: [id number] Patient Account Number: [id number] Date of Birth/Sex: Treating RN: 10/06/1925 (88 y.o. Nancy Fetter Primary Care Maansi Wike: Marda Stalker Other Clinician: Referring Reika Callanan: Treating Jalani Cullifer/Extender: Aviva Signs in Treatment: 0 Visit Information History Since Last Visit Added or deleted any medications: No Patient Arrived: Wheel Chair Any new allergies or adverse reactions: No Arrival Time: 13:38 Had a fall or experienced change in No Accompanied By: daughter activities of daily living that may affect Transfer Assistance: Manual risk of falls: Patient Identification Verified: Yes Signs or symptoms of abuse/neglect since last visito No Secondary Verification Process Completed: Yes Hospitalized since last visit: No Patient Requires Transmission-Based Precautions: No Implantable device outside of the clinic excluding No Patient Has Alerts: No cellular tissue based products placed in the center since last visit: Has Dressing in Place as Prescribed: Yes Has Compression in Place as Prescribed: Yes Pain Present Now: No Electronic Signature(s) Signed: 11/30/2021 4:41:04 PM By: Levan Hurst RN, BSN Entered By: Levan Hurst on 11/30/2021 14:05:27 -------------------------------------------------------------------------------- Clinic Level of Care Assessment Details Patient Name: Date of Service: [name], KURODA [name] 11/30/2021 1:30 PM Medical Record Number: [id number] Patient Account Number: [id number] Date of Birth/Sex: Treating RN: January 01, 1925 (88 y.o. Nancy Fetter Primary Care Anderson Middlebrooks: Marda Stalker Other Clinician: Referring Kasia Trego: Treating Tyreque Finken/Extender: Aviva Signs in Treatment: 0 Clinic Level of Care  Assessment Items TOOL 4 Quantity Score X- 1 0 Use when only an EandM is performed on FOLLOW-UP visit ASSESSMENTS - Nursing Assessment / Reassessment X- 1 10 Reassessment of Co-morbidities (includes updates in patient status) X- 1 5 Reassessment of Adherence to Treatment Plan ASSESSMENTS - Wound and Skin A ssessment / Reassessment X - Simple Wound Assessment / Reassessment - one wound 1 5 []  - 0 Complex Wound Assessment / Reassessment - multiple wounds []  - 0 Dermatologic / Skin Assessment (not related to wound area) ASSESSMENTS - Focused Assessment []  - 0 Circumferential Edema Measurements - multi extremities []  - 0 Nutritional Assessment / Counseling / Intervention []  - 0 Lower Extremity Assessment (monofilament, tuning fork, pulses) []  - 0 Peripheral Arterial Disease Assessment (using hand held doppler) ASSESSMENTS - Ostomy and/or Continence Assessment and Care []  - 0 Incontinence Assessment and Management []  - 0 Ostomy Care Assessment and Management (repouching, etc.) PROCESS - Coordination of Care X - Simple Patient / Family Education for ongoing care 1 15 []  - 0 Complex (extensive) Patient / Family Education for ongoing care X- 1 10 Staff obtains Programmer, systems, Records, T Results / Process Orders est []  - 0 Staff telephones HHA, Nursing Homes / Clarify orders / etc []  - 0 Routine Transfer to another Facility (non-emergent condition) []  - 0 Routine Hospital Admission (non-emergent condition) []  - 0 New Admissions / Biomedical engineer / Ordering NPWT Apligraf, etc. , []  - 0 Emergency Hospital Admission (emergent condition) X- 1 10 Simple Discharge Coordination []  - 0 Complex (extensive) Discharge Coordination PROCESS - Special Needs []  - 0 Pediatric / Minor Patient Management []  - 0 Isolation Patient Management []  - 0 Hearing / Language / Visual special needs []  - 0 Assessment of Community assistance (transportation, D/C planning, etc.) []  -  0 Additional assistance / Altered mentation []  - 0 Support Surface(s) Assessment (bed, cushion, seat, etc.) INTERVENTIONS - Wound Cleansing / Measurement X - Simple Wound Cleansing - one wound 1 5 []  - 0 Complex  Wound Cleansing - multiple wounds []  - 0 Wound Imaging (photographs - any number of wounds) []  - 0 Wound Tracing (instead of photographs) X- 1 5 Simple Wound Measurement - one wound []  - 0 Complex Wound Measurement - multiple wounds INTERVENTIONS - Wound Dressings []  - 0 Small Wound Dressing one or multiple wounds []  - 0 Medium Wound Dressing one or multiple wounds X- 1 20 Large Wound Dressing one or multiple wounds []  - 0 Application of Medications - topical []  - 0 Application of Medications - injection INTERVENTIONS - Miscellaneous []  - 0 External ear exam []  - 0 Specimen Collection (cultures, biopsies, blood, body fluids, etc.) []  - 0 Specimen(s) / Culture(s) sent or taken to Lab for analysis []  - 0 Patient Transfer (multiple staff / Civil Service fast streamer / Similar devices) []  - 0 Simple Staple / Suture removal (25 or less) []  - 0 Complex Staple / Suture removal (26 or more) []  - 0 Hypo / Hyperglycemic Management (close monitor of Blood Glucose) []  - 0 Ankle / Brachial Index (ABI) - do not check if billed separately X- 1 5 Vital Signs Has the patient been seen at the hospital within the last three years: Yes Total Score: 90 Level Of Care: New/Established - Level 3 Electronic Signature(s) Signed: 11/30/2021 4:41:04 PM By: Levan Hurst RN, BSN Entered By: Levan Hurst on 11/30/2021 14:04:15 -------------------------------------------------------------------------------- Encounter Discharge Information Details Patient Name: Date of Service: [name], [name] 11/30/2021 1:30 PM Medical Record Number: [id number] Patient Account Number: [id number] Date of Birth/Sex: Treating RN: Mar 28, 1925 (88 y.o. Nancy Fetter Primary Care Kethan Papadopoulos: Marda Stalker Other  Clinician: Referring Dadrian Ballantine: Treating Micki Cassel/Extender: Aviva Signs in Treatment: 0 Encounter Discharge Information Items Discharge Condition: Stable Ambulatory Status: Wheelchair Discharge Destination: Home Transportation: Private Auto Accompanied By: daughter Schedule Follow-up Appointment: Yes Clinical Summary of Care: Patient Declined Electronic Signature(s) Signed: 11/30/2021 4:41:04 PM By: Levan Hurst RN, BSN Entered By: Levan Hurst on 11/30/2021 14:05:06 -------------------------------------------------------------------------------- Wound Assessment Details Patient Name: Date of Service: [name], [name] 11/30/2021 1:30 PM Medical Record Number: [id number] Patient Account Number: [id number] Date of Birth/Sex: Treating RN: Feb 05, 1925 (88 y.o. Nancy Fetter Primary Care Munirah Doerner: Marda Stalker Other Clinician: Referring Ana Woodroof: Treating Joelle Flessner/Extender: Marjo Bicker Weeks in Treatment: 0 Wound Status Wound Number: 26 Primary Venous Leg Ulcer Etiology: Wound Location: Left, Lateral Ankle Wound Open Wounding Event: Gradually Appeared Status: Date Acquired: 11/20/2021 Comorbid Anemia, Congestive Heart Failure, Hypertension, Peripheral Weeks Of Treatment: 0 History: Venous Disease, Osteoarthritis, Received Chemotherapy Clustered Wound: No Wound Measurements Length: (cm) 4 Width: (cm) 3 Depth: (cm) 0.1 Area: (cm) 9.425 Volume: (cm) 0.942 Wound Description Classification: Full Thickness Without Exposed Support Structu Wound Margin: Distinct, outline attached Exudate Amount: Medium Exudate Type: Serosanguineous Exudate Color: red, brown res Foul Odor After Cleansing: No Slough/Fibrino Yes % Reduction in Area: 0% % Reduction in Volume: 0% Epithelialization: None Tunneling: No Undermining: No Wound Bed Granulation Amount: Large (67-100%) Exposed Structure Granulation Quality: Red Fascia  Exposed: No Necrotic Amount: Small (1-33%) Fat Layer (Subcutaneous Tissue) Exposed: Yes Necrotic Quality: Adherent Slough Tendon Exposed: No Muscle Exposed: No Joint Exposed: No Bone Exposed: No Treatment Notes Wound #26 (Ankle) Wound Laterality: Left, Lateral Cleanser Soap and Water Discharge Instruction: May shower and wash wound with dial antibacterial soap and water prior to dressing change. Peri-Wound Care Triamcinolone 15 (g) Discharge Instruction: Use triamcinolone 15 (g) as directed Sween Lotion (Moisturizing lotion) Discharge Instruction: Apply moisturizing lotion as directed Topical Gentamicin Discharge Instruction: As directed  by physician Primary Dressing PolyMem Silver Non-Adhesive Dressing, 4.25x4.25 in Discharge Instruction: Apply to wound bed as instructed Secondary Dressing ABD Pad, 5x9 Discharge Instruction: Apply over primary dressing as directed. Secured With Compression Wrap Kerlix Roll 4.5x3.1 (in/yd) Discharge Instruction: Apply Kerlix and Coban compression as directed. Coban Self-Adherent Wrap 4x5 (in/yd) Discharge Instruction: Apply over Kerlix as directed. Compression Stockings Add-Ons Electronic Signature(s) Signed: 11/30/2021 4:41:04 PM By: Levan Hurst RN, BSN Entered By: Levan Hurst on 11/30/2021 14:03:44 -------------------------------------------------------------------------------- [place] Details Patient Name: Date of Service: [name], [name] 11/30/2021 1:30 PM Medical Record Number: [id number] Patient Account Number: [id number] Date of Birth/Sex: Treating RN: 04-14-1925 (88 y.o. Nancy Fetter Primary Care Kharter Sestak: Marda Stalker Other Clinician: Referring Kratos Ruscitti: Treating Aryam Zhan/Extender: Marjo Bicker Weeks in Treatment: 0 Vital Signs Time Taken: 13:38 Temperature (F): 97.7 Height (in): 66 Pulse (bpm): 75 Weight (lbs): 176 Respiratory Rate (breaths/min): 16 Body Mass Index (BMI):  28.4 Blood Pressure (mmHg): 132/71 Reference Range: 80 - 120 mg / dl Electronic Signature(s) Signed: 11/30/2021 4:41:04 PM By: Levan Hurst RN, BSN Entered By: Levan Hurst on 11/30/2021 14:03:16

## 2021-11-30 NOTE — Progress Notes (Signed)
[name] Date of Birth: 1925/09/26 MRN: [id number] [place], PA-C Former Cardiology Providers: Jeri Lager, APRN, FNP-C Primary Cardiologist: Rex Kras, DO, [facility] (established care May 23, 2020)  Date: 11/30/21 Last Office Visit: 05/28/2021.   Chief Complaint  Patient presents with   heart failure management   Follow-up    HPI  [name] is a 88 y.o.  female who presents to the office with a chief complaint of " 110-monthfollow-up for congestive heart failure management..Marland KitchenPatient's past medical history and cardiovascular risk factors include: Chronic heart failure with preserved EF, stage B, NYHA class II, history of recovered cardiomyopathy, history of DVT, GERD, history of GI bleed due to diverticulosis, chronic anemia, hypertension, chronic venous stasis, history of uterine cancer status post radiation therapy in 2016, history of breast cancer status postmastectomy and radiation therapy.  Patient presents to the office accompanied by her daughter [name]  Patient is being followed for the management of congestive heart failure.   Patient presents for 643-monthollow-up for heart failure management.  Patient's LVEF has recovered compared to past.  And clinically she is doing well.  She denies orthopnea, paroxysmal nocturnal dyspnea or lower extremity swelling.  No hospitalizations or urgent care visits for cardiovascular symptoms since July 2022 which was last office visit.  Unfortunately, over the last 2 weeks patient developed a venous ulcer and has been receiving care at wound care clinic.  Left lower extremity is wrapped in Kerlix up to the knee.  She does have foul-smelling odor which has been stable according to the daughter.  She denies any fevers or chills.  She is currently not on antibiotics.  Her weight overall remained stable but has lost 5 pounds since last office visit 6 months ago.  Her blood pressures are well controlled  according to the daughter SBP ranges between 117-129 mmHg.  FUNCTIONAL STATUS: Walks with cane and walker.    ALLERGIES: No Known Allergies   MEDICATION LIST PRIOR TO VISIT: Current Outpatient Medications on File Prior to Visit  Medication Sig Dispense Refill   acetaminophen (TYLENOL) 325 MG tablet Take 650 mg by mouth every 6 (six) hours as needed.     calcium carbonate (OS-CAL - DOSED IN MG OF ELEMENTAL CALCIUM) 1250 (500 Ca) MG tablet Take 1 tablet by mouth.     Cholecalciferol (VITAMIN D3) 1000 UNITS CAPS Take 1,000 Units by mouth daily.     citalopram (CELEXA) 20 MG tablet Take 20 mg by mouth daily.     ENTRESTO 24-26 MG TAKE 1 TABLET BY MOUTH TWICE DAILY 180 tablet 3   furosemide (LASIX) 20 MG tablet TAKE 1 TABLET BY MOUTH AS NEEDED 90 tablet 1   Multiple Vitamins-Minerals (CENTRUM SILVER PO) Take 1 tablet by mouth daily.     NONFORMULARY OR COMPOUNDED ITEM Antifungal solution: Terbinafine 3%, Fluconazole 2%, Tea Tree Oil 5%, Urea 10%, Ibuprofen 2% in DMSO suspension #3074m each 3   Omega-3 Fatty Acids (FISH OIL) 1000 MG CAPS Take 1,200 mg by mouth daily.     QUEtiapine (SEROQUEL) 25 MG tablet Take 2 tablets (50 mg total) by mouth at bedtime. 180 tablet 3   spironolactone (ALDACTONE) 25 MG tablet TAKE 1 TABLET(25 MG) BY MOUTH DAILY 90 tablet 1   No current facility-administered medications on file prior to visit.    PAST MEDICAL HISTORY: Past Medical History:  Diagnosis Date   Anxiety    [place], right 2007   Diverticulosis  Diverticulosis of colon with hemorrhage 06/19/2014   DVT (deep venous thrombosis) ([place]) 1992   right leg x 2   Endometrial adenocarcinoma ([place]) 12/25/12   biopsy   Family history of malignant neoplasm of gastrointestinal tract    GERD (gastroesophageal reflux disease)    History of blood transfusion    Hx of radiation therapy 02/2006 - 03/2006   right breast   Hx of radiation therapy 5/12, 5/19, 5/29, 6/2, 04/30/2013    proximal vagina, 30 Gy in 5 sessions, HDR   Hypertension    Iron deficiency    Memory loss    Peripheral vascular disease ([place])    leg wounds   Personal history of radiation therapy    Ulcers of both lower extremities ([place])    chronic   Venous stasis    bilateral    PAST SURGICAL HISTORY: Past Surgical History:  Procedure Laterality Date   ABDOMINAL HYSTERECTOMY  01/18/13   robotic, bso   BREAST LUMPECTOMY Right    COLONOSCOPY WITH PROPOFOL N/A 09/16/2017   Procedure: COLONOSCOPY WITH PROPOFOL;  Surgeon: Wilford Corner, MD;  Location: [facility];  Service: Endoscopy;  Laterality: N/A;  Needs STAT CBC drawn before procedure   MASTECTOMY Left 1994   left , Tamoxifen x 17 yrs   partial mastectomy Right 2007   radiation    FAMILY HISTORY: The patient's family history includes Anuerysm in her sister; Breast cancer in her daughter and sister; Cancer in her brother and father; Colon cancer in her sister; Heart disease in her sister; Other in her mother; Prostate cancer in her brother.   SOCIAL HISTORY:  The patient  reports that she has never smoked. She has never used smokeless tobacco. She reports that she does not drink alcohol and does not use drugs.  Review of Systems  Constitutional: Negative for chills and fever.  HENT:  Negative for hoarse voice and nosebleeds.   Eyes:  Negative for discharge, double vision and pain.  Cardiovascular:  Positive for leg swelling (chronic). Negative for chest pain, claudication, dyspnea on exertion, near-syncope, orthopnea, palpitations, paroxysmal nocturnal dyspnea and syncope.  Respiratory:  Negative for hemoptysis and shortness of breath.   Musculoskeletal:  Negative for muscle cramps and myalgias.       Open wound w/ dressing LLE.   Gastrointestinal:  Negative for abdominal pain, constipation, diarrhea, hematemesis, hematochezia, melena, nausea and vomiting.  Neurological:  Negative for dizziness and light-headedness.   PHYSICAL  EXAM: Vitals with BMI 11/30/2021 07/08/2021 05/28/2021  Height '5\' 6"'  '5\' 6"'  '5\' 6"'   Weight 166 lbs 171 lbs 171 lbs  BMI 26.81 30.86 57.84  Systolic 696 295 284  Diastolic 63 65 71  Pulse 64 59 60    CONSTITUTIONAL: Age-appropriate female, hemodynamically stable, no acute distress.   SKIN: Skin is warm and dry. No rash noted. No cyanosis. No pallor. No jaundice HEAD: Normocephalic and atraumatic.  EYES: No scleral icterus MOUTH/THROAT: Moist oral membranes.  NECK: No JVD present. No thyromegaly noted. No carotid bruits  LYMPHATIC: No visible cervical adenopathy.  CHEST Normal respiratory effort. No intercostal retractions  LUNGS: Clear to auscultation bilaterally.  No stridor. No wheezes. No rales.  CARDIOVASCULAR: Regular, positive X3-K4, soft holosystolic murmur heard at the apex, no gallops or rubs. ABDOMINAL: No apparent ascites.  EXTREMITIES: +1 bilateral peripheral edema, wearing bilateral compression stockings up to the knee, dressing LLE due to wound, foul smelling.  HEMATOLOGIC: No significant bruising NEUROLOGIC: Oriented to person, place, and time. Nonfocal. Normal muscle tone.  PSYCHIATRIC: Normal mood and affect. Normal behavior. Cooperative  CARDIAC DATABASE: EKG: 05/28/2021: Normal sinus rhythm, 61 bpm, first-degree AV block, consider old anteroseptal infarct, without underlying injury pattern. 11/30/2021: NSR, 65 bpm, old anteroseptal infarct, without underlying injury pattern.    Echocardiogram: 08/22/2018: LVEF 44%, moderate LVH, mildly decreased global wall motion, grade 1 diastolic impairment.  Trace MR, moderate TR, RVSP 40 mmHg.  07/02/2019: LVEF 67%, grade 1 diastolic impairment, mild biatrial dilatation, trace AR, mild MR, moderate TR, PASP 44 mmHg.  Stress Testing:  None  Heart Catheterization: None   LABORATORY DATA: CBC Latest Ref Rng & Units 12/20/2018 08/08/2018 06/19/2018  WBC 4.0 - 10.5 K/uL 5.2 5.1 6.3  Hemoglobin 12.0 - 15.0 g/dL 10.5(L) 10.2(L) 10.8(L)   Hematocrit 36.0 - 46.0 % 32.0(L) 30.8(L) 32.4(L)  Platelets 150 - 400 K/uL 191 218 216    CMP Latest Ref Rng & Units 05/28/2021 01/15/2019 12/20/2018  Glucose 65 - 99 mg/dL 102(H) 84 89  BUN 10 - 36 mg/dL '29 19 23  ' Creatinine 0.57 - 1.00 mg/dL 1.34(H) 1.06(H) 1.08(H)  Sodium 134 - 144 mmol/L 140 143 141  Potassium 3.5 - 5.2 mmol/L 4.1 5.0 4.3  Chloride 96 - 106 mmol/L 102 103 103  CO2 20 - 29 mmol/L 22 19(L) 31  Calcium 8.7 - 10.3 mg/dL 9.7 9.8 9.7  Total Protein 6.5 - 8.1 g/dL - - 6.8  Total Bilirubin 0.3 - 1.2 mg/dL - - 0.4  Alkaline Phos 38 - 126 U/L - - 62  AST 15 - 41 U/L - - 12(L)  ALT 0 - 44 U/L - - 7    Lipid Panel  No results found for: CHOL, TRIG, HDL, CHOLHDL, VLDL, LDLCALC, LDLDIRECT, LABVLDL  No results found for: HGBA1C No components found for: NTPROBNP No results found for: TSH  Cardiac Panel (last 3 results) No results for input(s): CKTOTAL, CKMB, TROPONINIHS, RELINDX in the last 72 hours.   External Labs: Collected: 09/24/2020 from care everywhere Creatinine 1.26 mg/dL. eGFR: 48 mL/min per 1.73 m Potassium 4.8 TSH 2.31 Magnesium 2.3  Collected: 08/20/2020 Serum creatinine 1.26 mg/dL. GFR 48 Lipid profile: Total cholesterol 161, triglycerides 54, HDL 56, LDL 94  IMPRESSION:    ICD-10-CM   1. Chronic diastolic heart failure (HCC)  I50.32 EKG 12-Lead    2. Recovered cardiomyopathy  I42.9     3. Chronic venous stasis dermatitis of both lower extremities  I87.2     4. History of deep venous thrombosis (DVT) of distal vein of right lower extremity  Z86.718     5. Essential hypertension  I10        RECOMMENDATIONS: Kirra Verga is a 88 y.o. female whose past medical history and cardiovascular risk factors include: Chronic heart failure with preserved EF, stage B, NYHA class II, history of recovered cardiomyopathy, history of DVT, GERD, history of GI bleed due to diverticulosis, chronic anemia, hypertension, chronic venous stasis, history of  uterine cancer status post radiation therapy in 2016, history of breast cancer status postmastectomy and radiation therapy.  Chronic heart failure with preserved EF, stage B, NYHA class II: Patient is currently euvolemic.   Has lost approximately 5 pounds since last office visit. Medications reconciled. Takes Lasix on a as needed basis. Home blood pressures are well controlled. No recent hospitalizations or urgent care visits for cardiovascular symptoms.  Recovered cardiomyopathy: Educated on importance of guideline directed medical therapy and improving her modifiable cardiovascular risk factors.  See above  Chronic venous stasis: Approximately 2 weeks  ago patient had a open wound on the left lower extremity.  Currently sees wound care clinic for management.  The lower extremity was not evaluated during today's encounter and is currently wrapped.  They do have an appointment with wound care later today.  The wound is foul-smelling and patient is informed to let her provider know to rule out underlying infectious etiology.  Currently not on antibiotics.  Will defer additional management to wound care for now.  Benign essential hypertension:  Blood pressure log reviewed.   Medications reconciled.   Low-salt diet recommended.   Currently managed by primary care provider.  FINAL MEDICATION LIST END OF ENCOUNTER: No orders of the defined types were placed in this encounter.     Current Outpatient Medications:    acetaminophen (TYLENOL) 325 MG tablet, Take 650 mg by mouth every 6 (six) hours as needed., Disp: , Rfl:    calcium carbonate (OS-CAL - DOSED IN MG OF ELEMENTAL CALCIUM) 1250 (500 Ca) MG tablet, Take 1 tablet by mouth., Disp: , Rfl:    Cholecalciferol (VITAMIN D3) 1000 UNITS CAPS, Take 1,000 Units by mouth daily., Disp: , Rfl:    citalopram (CELEXA) 20 MG tablet, Take 20 mg by mouth daily., Disp: , Rfl:    ENTRESTO 24-26 MG, TAKE 1 TABLET BY MOUTH TWICE DAILY, Disp: 180 tablet, Rfl:  3   furosemide (LASIX) 20 MG tablet, TAKE 1 TABLET BY MOUTH AS NEEDED, Disp: 90 tablet, Rfl: 1   Multiple Vitamins-Minerals (CENTRUM SILVER PO), Take 1 tablet by mouth daily., Disp: , Rfl:    NONFORMULARY OR COMPOUNDED ITEM, Antifungal solution: Terbinafine 3%, Fluconazole 2%, Tea Tree Oil 5%, Urea 10%, Ibuprofen 2% in DMSO suspension #11m, Disp: 1 each, Rfl: 3   Omega-3 Fatty Acids (FISH OIL) 1000 MG CAPS, Take 1,200 mg by mouth daily., Disp: , Rfl:    QUEtiapine (SEROQUEL) 25 MG tablet, Take 2 tablets (50 mg total) by mouth at bedtime., Disp: 180 tablet, Rfl: 3   spironolactone (ALDACTONE) 25 MG tablet, TAKE 1 TABLET(25 MG) BY MOUTH DAILY, Disp: 90 tablet, Rfl: 1  Orders Placed This Encounter  Procedures   EKG 12-Lead   Total time spent 22 minutes:   --Continue cardiac medications as reconciled in final medication list. --Return in about 6 months (around 05/30/2022) for Follow up, heart failure management.. Or sooner if needed. --Continue follow-up with your primary care physician regarding the management of your other chronic comorbid conditions.  Patient's questions and concerns were addressed to her satisfaction. She voices understanding of the instructions provided during this encounter.   This note was created using a voice recognition software as a result there may be grammatical errors inadvertently enclosed that do not reflect the nature of this encounter. Every attempt is made to correct such errors.  SRex Kras DNevada FCohen [facility] Pager: [phone]: [phone]

## 2021-11-30 NOTE — Progress Notes (Signed)
[name], [name] ([id number]) Visit Report for 11/30/2021 SuperBill Details Patient Name: Date of Service: [name], [name] 11/30/2021 Medical Record Number: [id number] Patient Account Number: [id number] Date of Birth/Sex: Treating RN: 1925/02/16 (88 y.o. Nancy Fetter Primary Care Provider: Marda Stalker Other Clinician: Referring Provider: Treating Provider/Extender: Marjo Bicker Weeks in Treatment: 0 Diagnosis Coding ICD-10 Codes Code Description [phone] Chronic venous hypertension (idiopathic) with ulcer of left lower extremity L97.822 Non-pressure chronic ulcer of other part of left lower leg with fat layer exposed C50.919 Malignant neoplasm of unspecified site of unspecified female breast I50.42 Chronic combined systolic (congestive) and diastolic (congestive) heart failure G30.9 Alzheimer's disease, unspecified C54.1 Malignant neoplasm of endometrium Facility Procedures CPT4 Code Description Modifier Quantity [id number] - WOUND CARE VISIT-LEV 3 EST PT 1 Electronic Signature(s) Signed: 11/30/2021 2:48:07 PM By: Kalman Shan DO Signed: 11/30/2021 4:41:04 PM By: Levan Hurst RN, BSN Entered By: Levan Hurst on 11/30/2021 14:05:17

## 2021-12-04 ENCOUNTER — Encounter (HOSPITAL_BASED_OUTPATIENT_CLINIC_OR_DEPARTMENT_OTHER): Payer: Medicare PPO | Admitting: Internal Medicine

## 2021-12-04 ENCOUNTER — Other Ambulatory Visit: Payer: Self-pay

## 2021-12-04 DIAGNOSIS — G309 Alzheimer's disease, unspecified: Secondary | ICD-10-CM | POA: Diagnosis not present

## 2021-12-04 DIAGNOSIS — I110 Hypertensive heart disease with heart failure: Secondary | ICD-10-CM | POA: Diagnosis not present

## 2021-12-04 DIAGNOSIS — L97822 Non-pressure chronic ulcer of other part of left lower leg with fat layer exposed: Secondary | ICD-10-CM

## 2021-12-04 DIAGNOSIS — I87312 Chronic venous hypertension (idiopathic) with ulcer of left lower extremity: Secondary | ICD-10-CM | POA: Diagnosis not present

## 2021-12-04 DIAGNOSIS — C50919 Malignant neoplasm of unspecified site of unspecified female breast: Secondary | ICD-10-CM | POA: Diagnosis not present

## 2021-12-04 DIAGNOSIS — I739 Peripheral vascular disease, unspecified: Secondary | ICD-10-CM | POA: Diagnosis not present

## 2021-12-04 DIAGNOSIS — I5042 Chronic combined systolic (congestive) and diastolic (congestive) heart failure: Secondary | ICD-10-CM | POA: Diagnosis not present

## 2021-12-04 DIAGNOSIS — C541 Malignant neoplasm of endometrium: Secondary | ICD-10-CM | POA: Diagnosis not present

## 2021-12-04 DIAGNOSIS — Z853 Personal history of malignant neoplasm of breast: Secondary | ICD-10-CM | POA: Diagnosis not present

## 2021-12-08 NOTE — Progress Notes (Addendum)
LINDER, PRAJAPATI ([id number]) Visit Report for 12/04/2021 Chief Complaint Document Details Patient Name: Date of Service: [name], [name] 12/04/2021 8:45 A M Medical Record Number: [id number] Patient Account Number: [id number] Date of Birth/Sex: Treating RN: 1925-04-16 (88 y.o. Elam Dutch Primary Care Provider: Marda Stalker Other Clinician: Referring Provider: Treating Provider/Extender: Aviva Signs in Treatment: 1 Information Obtained from: Patient Chief Complaint Left lower extremity wound Electronic Signature(s) Signed: 12/04/2021 9:38:27 AM By: Kalman Shan DO Entered By: Kalman Shan on 12/04/2021 09:32:11 -------------------------------------------------------------------------------- Debridement Details Patient Name: Date of Service: [name], [name] 12/04/2021 8:45 A M Medical Record Number: [id number] Patient Account Number: [id number] Date of Birth/Sex: Treating RN: 12/05/1924 (89 y.o. [name], Meta.Reding Primary Care Provider: Marda Stalker Other Clinician: Referring Provider: Treating Provider/Extender: Marjo Bicker Weeks in Treatment: 1 Debridement Performed for Assessment: Wound #26 Left,Lateral Ankle Performed By: Physician Kalman Shan, DO Debridement Type: Debridement Severity of Tissue Pre Debridement: Fat layer exposed Level of Consciousness (Pre-procedure): Awake and Alert Pre-procedure Verification/Time Out Yes - 08:50 Taken: Start Time: 08:51 Pain Control: Other : Benzocaine 20% T Area Debrided (L x W): otal 4 (cm) x 4.3 (cm) = 17.2 (cm) Tissue and other material debrided: Viable, Non-Viable, Slough, Subcutaneous, Skin: Dermis , Skin: Epidermis, Fibrin/Exudate, Slough Level: Skin/Subcutaneous Tissue Debridement Description: Excisional Instrument: Curette Specimen: Swab, Number of Specimens T aken: 1 Bleeding: Minimum Hemostasis Achieved: Pressure End Time:  09:00 Procedural Pain: 0 Post Procedural Pain: 0 Response to Treatment: Procedure was tolerated well Level of Consciousness (Post- Awake and Alert procedure): Post Debridement Measurements of Total Wound Length: (cm) 4 Width: (cm) 4.3 Depth: (cm) 0.1 Volume: (cm) 1.351 Character of Wound/Ulcer Post Debridement: Requires Further Debridement Severity of Tissue Post Debridement: Fat layer exposed Post Procedure Diagnosis Same as Pre-procedure Electronic Signature(s) Signed: 12/04/2021 9:38:27 AM By: Kalman Shan DO Signed: 12/04/2021 1:40:06 PM By: Deon Pilling RN, BSN Entered By: Deon Pilling on 12/04/2021 09:01:25 -------------------------------------------------------------------------------- HPI Details Patient Name: Date of Service: [name], [name] 12/04/2021 8:45 A M Medical Record Number: [id number] Patient Account Number: [id number] Date of Birth/Sex: Treating RN: 04-17-1925 (88 y.o. Elam Dutch Primary Care Provider: Marda Stalker Other Clinician: Referring Provider: Treating Provider/Extender: Marjo Bicker Weeks in Treatment: 1 History of Present Illness HPI Description: this is a patient we know from several prior wounds on her bilateral lower extremities. She has venous stasis physiology, inflammation and hypertension. She is been fully evaluated for venous ablation and was found not to be a candidate. She does not have significant arterial disease. 02/14/15 the wound itself on her lateral left leg did not look too bad however there was surrounding maceration which was concerning 02/27/15 the wound itself is small with surrounding circumferential epithelialization there is no surrounding maceration. 03/06/15 only a very small area remains. I think this is mostly epithelialized however I would be uncomfortable not dressing this this week 03/13/15 the area is epithelialized. There was a thick surface on this. I took a #15 blade then lightly  disrupted it but there does not appear to be any open area I did not see anything but appropriate epithelium Readmission: 08/16/18 on evaluation today patient presents for initial evaluation and our clinic released readmission although she has not been seen since April 2016. Nonetheless she is having issues with the ulcers currently on the bilateral lateral malleolus or locations as well as the left lateral lower extremity. These have been present for roughly 3 weeks since the patient actually developed significant  bilateral lower extremity edema which patient's daughter states was roughly 3 times the size of her legs currently. Subsequently she ended up in the emerging department due to congestive heart failure. They were able to get the swelling under control and fortunately she seems to be doing much better at this point in time. She was placed in an [employer] which she sat on for week part evaluation today as well. Fortunately the wound areas actually seem to be doing fairly well there is some macerated skin surrounding there are the areas/surface of the wound which are gonna require some debridement at this point. No fevers, chills, nausea, or vomiting noted at this time. Patient has a history of hypertension, mild peripheral vascular disease, chronic venous stasis, and other than the ulcer seems to be doing fairly well today. No fevers, chills, nausea, or vomiting noted at this time. 08/23/18 on evaluation today patient actually appears to be doing great in regard to her bilateral lower should be ulcers. She's shown signs of improvement even just with one week with the [employer]. In general I'm actually very pleased with how things appear at this point. 09/06/18 on evaluation today patient actually appears to be doing much better her left lower extremity is completely healed. Go to the right lower extremity lateral malleolus ulcer this still seems to be showing signs of being open.  There does not appear to be evidence of infection which is good news. This did require some sharp debridement however to remove some of the necrotic tissue/eschar on the surface of the wound Readmission 11/27/2021 [name] is a 88 year old female with a past medical history of endometrial and breast cancer, chronic venous insufficiency, dementia and essential hypertension that presents to the clinic with a 1 month history of nonhealing ulcer to the left lower extremity. She states that the ulcer opened 1 month ago and then healed but has reopened again in the past week. She is currently keeping the area covered. She has a history of wounds to her lower extremities. She has compression stockings that she uses daily. She currently denies signs of infection. 1/13; patient presents for follow-up. She followed up for nurse visit for wrap change. She reports more odor. She denies pain. Electronic Signature(s) Signed: 12/04/2021 9:38:27 AM By: Kalman Shan DO Entered By: Kalman Shan on 12/04/2021 09:32:40 -------------------------------------------------------------------------------- Physical Exam Details Patient Name: Date of Service: [name], [name] 12/04/2021 8:45 A M Medical Record Number: [id number] Patient Account Number: [id number] Date of Birth/Sex: Treating RN: May 01, 1925 (88 y.o. Elam Dutch Primary Care Provider: Marda Stalker Other Clinician: Referring Provider: Treating Provider/Extender: Marjo Bicker Weeks in Treatment: 1 Constitutional respirations regular, non-labored and within target range for patient.. Cardiovascular 2+ dorsalis pedis/posterior tibialis pulses. Psychiatric pleasant and cooperative. Notes Left lower extremity: T the distal lateral aspect there is an open wound with nonviable tissue, granulation tissue and circumferential maceration. No signs of o soft tissue infection. Continues to have odor. Denies  pain. Electronic Signature(s) Signed: 12/04/2021 9:38:27 AM By: Kalman Shan DO Entered By: Kalman Shan on 12/04/2021 09:33:19 -------------------------------------------------------------------------------- Physician Orders Details Patient Name: Date of Service: [name], [name] 12/04/2021 8:45 A M Medical Record Number: [id number] Patient Account Number: [id number] Date of Birth/Sex: Treating RN: October 07, 1925 (88 y.o. [name] Primary Care Provider: Marda Stalker Other Clinician: Referring Provider: Treating Provider/Extender: Aviva Signs in Treatment: 1 Verbal / Phone Orders: No Diagnosis Coding ICD-10 Coding Code Description [phone] Chronic venous hypertension (  idiopathic) with ulcer of left lower extremity L97.822 Non-pressure chronic ulcer of other part of left lower leg with fat layer exposed C50.919 Malignant neoplasm of unspecified site of unspecified female breast I50.42 Chronic combined systolic (congestive) and diastolic (congestive) heart failure G30.9 Alzheimer's disease, unspecified C54.1 Malignant neoplasm of endometrium Follow-up Appointments ppointment in 1 week. - Friday with Dr. Heber [place] Return A Pick up gentamicin ointment at pharmacy. Other: - [employer] [facility]. Bathing/ Shower/ Hygiene May shower and wash wound with soap and water. - with dressing changes may wash with soap and water. Edema Control - Lymphedema / SCD / Other Elevate legs to the level of the heart or above for 30 minutes daily and/or when sitting, a frequency of: Avoid standing for long periods of time. Patient to wear own compression stockings every day. - apply in the morning and remove at night to both legs. Exercise regularly Moisturize legs daily. - every night before day. Additional Orders / Instructions Follow Nutritious Diet - High Protein Diet Home Health Admit to [facility] for wound care. May utilize  formulary equivalent dressing for wound treatment orders unless otherwise specified. New wound care orders this week; continue Home Health for wound care. May utilize formulary equivalent dressing for wound treatment orders unless otherwise specified. - Home Health for skilled wound care nursing twice a week and wound center weekly. Wound Treatment Wound #26 - Ankle Wound Laterality: Left, Lateral Cleanser: Soap and Water 1 x Per Day/30 Days Discharge Instructions: May shower and wash wound with dial antibacterial soap and water prior to dressing change. Peri-Wound Care: Skin Prep 1 x Per Day/30 Days Discharge Instructions: Use skin prep as directed Peri-Wound Care: Sween Lotion (Moisturizing lotion) 1 x Per Day/30 Days Discharge Instructions: Apply moisturizing lotion as directed Topical: Gentamicin 1 x Per Day/30 Days Discharge Instructions: Apply directly to wound bed. Prim Dressing: Maxorb Extra Calcium Alginate Dressing, 4x4 in 1 x Per Day/30 Days ary Discharge Instructions: Apply calcium alginate to wound bed as instructed Secondary Dressing: Woven Gauze Sponge, Non-Sterile 4x4 in 1 x Per Day/30 Days Discharge Instructions: Apply over primary dressing as directed. Secondary Dressing: Bordered Gauze, 6x6 (in/in) 1 x Per Day/30 Days Discharge Instructions: Apply over primary dressing as directed. Laboratory erobe culture (MICRO) Bacteria identified in Unspecified specimen by A LOINC Code: 253-6 Convenience Name: Areobic culture-specimen not specified Patient Medications llergies: No Known Drug Allergies A Notifications Medication Indication Start End 12/04/2021 gentamicin DOSE 1 - topical 0.1 % cream - 1 application daily Electronic Signature(s) Signed: 12/04/2021 9:34:18 AM By: Kalman Shan DO Entered By: Kalman Shan on 12/04/2021 09:34:17 -------------------------------------------------------------------------------- Problem List Details Patient Name: Date of  Service: DERRIANA, OSER [name] 12/04/2021 8:45 A M Medical Record Number: [id number] Patient Account Number: [id number] Date of Birth/Sex: Treating RN: [birth year] (88 y.o. [name], Meta.Reding Primary Care Provider: Marda Stalker Other Clinician: Referring Provider: Treating Provider/Extender: Aviva Signs in Treatment: 1 Active Problems ICD-10 Encounter Code Description Active Date MDM Diagnosis I87.312 Chronic venous hypertension (idiopathic) with ulcer of left lower extremity 11/27/2021 No Yes L97.822 Non-pressure chronic ulcer of other part of left lower leg with fat layer exposed1/04/2022 No Yes C50.919 Malignant neoplasm of unspecified site of unspecified female breast 11/27/2021 No Yes I50.42 Chronic combined systolic (congestive) and diastolic (congestive) heart failure 11/27/2021 No Yes G30.9 Alzheimer's disease, unspecified 11/27/2021 No Yes C54.1 Malignant neoplasm of endometrium 11/27/2021 No Yes Inactive Problems Resolved Problems Electronic Signature(s) Signed: 12/04/2021 9:38:27 AM By: Kalman Shan DO  Entered By: Kalman Shan on 12/04/2021 09:31:53 -------------------------------------------------------------------------------- Progress Note Details Patient Name: Date of Service: [name], [name] 12/04/2021 8:45 A M Medical Record Number: [id number] Patient Account Number: [id number] Date of Birth/Sex: Treating RN: 20-Oct-1925 (88 y.o. Elam Dutch Primary Care Provider: Marda Stalker Other Clinician: Referring Provider: Treating Provider/Extender: Aviva Signs in Treatment: 1 Subjective Chief Complaint Information obtained from Patient Left lower extremity wound History of Present Illness (HPI) this is a patient we know from several prior wounds on her bilateral lower extremities. She has venous stasis physiology, inflammation and hypertension. She is been fully evaluated for venous ablation and was  found not to be a candidate. She does not have significant arterial disease. 02/14/15 the wound itself on her lateral left leg did not look too bad however there was surrounding maceration which was concerning 02/27/15 the wound itself is small with surrounding circumferential epithelialization there is no surrounding maceration. 03/06/15 only a very small area remains. I think this is mostly epithelialized however I would be uncomfortable not dressing this this week 03/13/15 the area is epithelialized. There was a thick surface on this. I took a #15 blade then lightly disrupted it but there does not appear to be any open area I did not see anything but appropriate epithelium Readmission: 08/16/18 on evaluation today patient presents for initial evaluation and our clinic released readmission although she has not been seen since April 2016. Nonetheless she is having issues with the ulcers currently on the bilateral lateral malleolus or locations as well as the left lateral lower extremity. These have been present for roughly 3 weeks since the patient actually developed significant bilateral lower extremity edema which patient's daughter states was roughly 3 times the size of her legs currently. Subsequently she ended up in the emerging department due to congestive heart failure. They were able to get the swelling under control and fortunately she seems to be doing much better at this point in time. She was placed in an [employer] which she sat on for week part evaluation today as well. Fortunately the wound areas actually seem to be doing fairly well there is some macerated skin surrounding there are the areas/surface of the wound which are gonna require some debridement at this point. No fevers, chills, nausea, or vomiting noted at this time. Patient has a history of hypertension, mild peripheral vascular disease, chronic venous stasis, and other than the ulcer seems to be doing fairly well today. No  fevers, chills, nausea, or vomiting noted at this time. 08/23/18 on evaluation today patient actually appears to be doing great in regard to her bilateral lower should be ulcers. She's shown signs of improvement even just with one week with the [employer]. In general I'm actually very pleased with how things appear at this point. 09/06/18 on evaluation today patient actually appears to be doing much better her left lower extremity is completely healed. Go to the right lower extremity lateral malleolus ulcer this still seems to be showing signs of being open. There does not appear to be evidence of infection which is good news. This did require some sharp debridement however to remove some of the necrotic tissue/eschar on the surface of the wound Readmission 11/27/2021 [name] is a 88 year old female with a past medical history of endometrial and breast cancer, chronic venous insufficiency, dementia and essential hypertension that presents to the clinic with a 1 month history of nonhealing ulcer to the left lower extremity.  She states that the ulcer opened 1 month ago and then healed but has reopened again in the past week. She is currently keeping the area covered. She has a history of wounds to her lower extremities. She has compression stockings that she uses daily. She currently denies signs of infection. 1/13; patient presents for follow-up. She followed up for nurse visit for wrap change. She reports more odor. She denies pain. Patient History Information obtained from Patient. Family History Cancer - Child, Hypertension - Child,Father, Kidney Disease - Child, Stroke - Siblings,Child, Thyroid Problems - Child, No family history of Diabetes, Heart Disease, Hereditary Spherocytosis, Lung Disease, Seizures, Tuberculosis. Social History Never smoker, Marital Status - Widowed, Alcohol Use - Never, Drug Use - No History, Caffeine Use - Never. Medical History Eyes Denies history of  Cataracts, Glaucoma, Optic Neuritis Ear/Nose/Mouth/Throat Denies history of Chronic sinus problems/congestion, Middle ear problems Hematologic/Lymphatic Patient has history of Anemia Denies history of Hemophilia, Human Immunodeficiency Virus, Lymphedema, Sickle Cell Disease Respiratory Denies history of Aspiration, Asthma, Chronic Obstructive Pulmonary Disease (COPD), Pneumothorax, Sleep Apnea, Tuberculosis Cardiovascular Patient has history of Congestive Heart Failure, Hypertension, Peripheral Venous Disease Denies history of Angina, Arrhythmia, Coronary Artery Disease, Hypotension, Myocardial Infarction, Peripheral Arterial Disease, Phlebitis, Vasculitis Gastrointestinal Denies history of Cirrhosis , Colitis, Crohnoos, Hepatitis A, Hepatitis B, Hepatitis C Endocrine Denies history of Type I Diabetes, Type II Diabetes Genitourinary Denies history of End Stage Renal Disease Immunological Denies history of Lupus Erythematosus, Raynaudoos, Scleroderma Integumentary (Skin) Denies history of History of Burn Musculoskeletal Patient has history of Osteoarthritis Denies history of Gout, Rheumatoid Arthritis, Osteomyelitis Neurologic Denies history of Neuropathy, Quadriplegia, Paraplegia, Seizure Disorder Oncologic Patient has history of Received Chemotherapy - Breast and Ovarian Cancer Denies history of Received Radiation Hospitalization/Surgery History - diverticulits. Objective Constitutional respirations regular, non-labored and within target range for patient.. Vitals Time Taken: 8:43 AM, Height: 66 in, Weight: 176 lbs, BMI: 28.4, Temperature: 98.2 F, Pulse: 73 bpm, Respiratory Rate: 16 breaths/min, Blood Pressure: 135/68 mmHg. Cardiovascular 2+ dorsalis pedis/posterior tibialis pulses. Psychiatric pleasant and cooperative. General Notes: Left lower extremity: T the distal lateral aspect there is an open wound with nonviable tissue, granulation tissue and circumferential  maceration. o No signs of soft tissue infection. Continues to have odor. Denies pain. Integumentary (Hair, Skin) Wound #26 status is Open. Original cause of wound was Gradually Appeared. The date acquired was: 11/20/2021. The wound has been in treatment 1 weeks. The wound is located on the Left,Lateral Ankle. The wound measures 4cm length x 4.3cm width x 0.1cm depth; 13.509cm^2 area and 1.351cm^3 volume. There is Fat Layer (Subcutaneous Tissue) exposed. There is no tunneling or undermining noted. There is a large amount of serosanguineous drainage noted. Foul odor after cleansing was noted. The wound margin is distinct with the outline attached to the wound base. There is medium (34-66%) red granulation within the wound bed. There is a medium (34-66%) amount of necrotic tissue within the wound bed including Adherent Slough. Assessment Active Problems ICD-10 Chronic venous hypertension (idiopathic) with ulcer of left lower extremity Non-pressure chronic ulcer of other part of left lower leg with fat layer exposed Malignant neoplasm of unspecified site of unspecified female breast Chronic combined systolic (congestive) and diastolic (congestive) heart failure Alzheimer's disease, unspecified Malignant neoplasm of endometrium Patient's wound is stable. I debrided nonviable tissue. There are no signs of surrounding tissue infection. However there is more odor and drainage on exam. At this time I recommended a PCR culture as she would likely benefit from  [facility] antibiotics. For now I recommended continuing with gentamicin ointment and changing the dressing to calcium alginate to help with drainage control. I will not wrap her leg this week but she can use her compression stockings daily. She has significant bioburden and I think this needs to be addressed before rewrapping her. Overall the wound does appears cleaner with more granulation tissue today. Procedures Wound #26 Pre-procedure  diagnosis of Wound #26 is a Venous Leg Ulcer located on the Left,Lateral Ankle .Severity of Tissue Pre Debridement is: Fat layer exposed. There was a Excisional Skin/Subcutaneous Tissue Debridement with a total area of 17.2 sq cm performed by Kalman Shan, DO. With the following instrument(s): Curette to remove Viable and Non-Viable tissue/material. Material removed includes Subcutaneous Tissue, Slough, Skin: Dermis, Skin: Epidermis, and Fibrin/Exudate after achieving pain control using Other (Benzocaine 20%). 1 specimen was taken by a Swab and sent to the lab per facility protocol. A time out was conducted at 08:50, prior to the start of the procedure. A Minimum amount of bleeding was controlled with Pressure. The procedure was tolerated well with a pain level of 0 throughout and a pain level of 0 following the procedure. Post Debridement Measurements: 4cm length x 4.3cm width x 0.1cm depth; 1.351cm^3 volume. Character of Wound/Ulcer Post Debridement requires further debridement. Severity of Tissue Post Debridement is: Fat layer exposed. Post procedure Diagnosis Wound #26: Same as Pre-Procedure Plan Follow-up Appointments: Return Appointment in 1 week. - Friday with Dr. Lorin Mercy up gentamicin ointment at pharmacy. Other: - [employer] [facility]. Bathing/ Shower/ Hygiene: May shower and wash wound with soap and water. - with dressing changes may wash with soap and water. Edema Control - Lymphedema / SCD / Other: Elevate legs to the level of the heart or above for 30 minutes daily and/or when sitting, a frequency of: Avoid standing for long periods of time. Patient to wear own compression stockings every day. - apply in the morning and remove at night to both legs. Exercise regularly Moisturize legs daily. - every night before day. Additional Orders / Instructions: Follow Nutritious Diet - High Protein Diet Home Health: Admit to Home Health for wound care. May  utilize formulary equivalent dressing for wound treatment orders unless otherwise specified. New wound care orders this week; continue Home Health for wound care. May utilize formulary equivalent dressing for wound treatment orders unless otherwise specified. - Home Health for skilled wound care nursing twice a week and wound center weekly. Laboratory ordered were: Areobic culture-specimen not specified The following medication(s) was prescribed: gentamicin topical 0.1 % cream 1 1 application daily starting 12/04/2021 WOUND #26: - Ankle Wound Laterality: Left, Lateral Cleanser: Soap and Water 1 x Per Day/30 Days Discharge Instructions: May shower and wash wound with dial antibacterial soap and water prior to dressing change. Peri-Wound Care: Skin Prep 1 x Per Day/30 Days Discharge Instructions: Use skin prep as directed Peri-Wound Care: Sween Lotion (Moisturizing lotion) 1 x Per Day/30 Days Discharge Instructions: Apply moisturizing lotion as directed Topical: Gentamicin 1 x Per Day/30 Days Discharge Instructions: Apply directly to wound bed. Prim Dressing: Maxorb Extra Calcium Alginate Dressing, 4x4 in 1 x Per Day/30 Days ary Discharge Instructions: Apply calcium alginate to wound bed as instructed Secondary Dressing: Woven Gauze Sponge, Non-Sterile 4x4 in 1 x Per Day/30 Days Discharge Instructions: Apply over primary dressing as directed. Secondary Dressing: Bordered Gauze, 6x6 (in/in) 1 x Per Day/30 Days Discharge Instructions: Apply over primary dressing as directed. 1. In office sharp  debridement 2. Gentamicin with calcium alginate under foam border dressing with compression stockings daily. 3. Follow-up in 1 week Electronic Signature(s) Signed: 12/04/2021 9:38:27 AM By: Kalman Shan DO Entered By: Kalman Shan on 12/04/2021 09:37:36 -------------------------------------------------------------------------------- HxROS Details Patient Name: Date of Service: [name], [name]  [name] 12/04/2021 8:45 A M Medical Record Number: [id number] Patient Account Number: [id number] Date of Birth/Sex: Treating RN: 04-May-1925 (88 y.o. Elam Dutch Primary Care Provider: Marda Stalker Other Clinician: Referring Provider: Treating Provider/Extender: Aviva Signs in Treatment: 1 Information Obtained From Patient Eyes Medical History: Negative for: Cataracts; Glaucoma; Optic Neuritis Ear/Nose/Mouth/Throat Medical History: Negative for: Chronic sinus problems/congestion; Middle ear problems Hematologic/Lymphatic Medical History: Positive for: Anemia Negative for: Hemophilia; Human Immunodeficiency Virus; Lymphedema; Sickle Cell Disease Respiratory Medical History: Negative for: Aspiration; Asthma; Chronic Obstructive Pulmonary Disease (COPD); Pneumothorax; Sleep Apnea; Tuberculosis Cardiovascular Medical History: Positive for: Congestive Heart Failure; Hypertension; Peripheral Venous Disease Negative for: Angina; Arrhythmia; Coronary Artery Disease; Hypotension; Myocardial Infarction; Peripheral Arterial Disease; Phlebitis; Vasculitis Gastrointestinal Medical History: Negative for: Cirrhosis ; Colitis; Crohns; Hepatitis A; Hepatitis B; Hepatitis C Endocrine Medical History: Negative for: Type I Diabetes; Type II Diabetes Genitourinary Medical History: Negative for: End Stage Renal Disease Immunological Medical History: Negative for: Lupus Erythematosus; Raynauds; Scleroderma Integumentary (Skin) Medical History: Negative for: History of Burn Musculoskeletal Medical History: Positive for: Osteoarthritis Negative for: Gout; Rheumatoid Arthritis; Osteomyelitis Neurologic Medical History: Negative for: Neuropathy; Quadriplegia; Paraplegia; Seizure Disorder Oncologic Medical History: Positive for: Received Chemotherapy - Breast and Ovarian Cancer Negative for: Received Radiation Immunizations Pneumococcal  Vaccine: Received Pneumococcal Vaccination: Yes Received Pneumococcal Vaccination On or After 60th Birthday: Yes Implantable Devices None Hospitalization / Surgery History Type of Hospitalization/Surgery diverticulits Family and Social History Cancer: Yes - Child; Diabetes: No; Heart Disease: No; Hereditary Spherocytosis: No; Hypertension: Yes - Child,Father; Kidney Disease: Yes - Child; Lung Disease: No; Seizures: No; Stroke: Yes - Siblings,Child; Thyroid Problems: Yes - Child; Tuberculosis: No; Never smoker; Marital Status - Widowed; Alcohol Use: Never; Drug Use: No History; Caffeine Use: Never; Financial Concerns: No; Food, Clothing or Shelter Needs: No; Support System Lacking: No; Transportation Concerns: No Electronic Signature(s) Signed: 12/04/2021 9:38:27 AM By: Kalman Shan DO Signed: 12/08/2021 11:39:47 AM By: Baruch Gouty RN, BSN Entered By: Kalman Shan on 12/04/2021 09:32:47 -------------------------------------------------------------------------------- SuperBill Details Patient Name: Date of Service: KELSE, PLOCH [name] 12/04/2021 Medical Record Number: [id number] Patient Account Number: [id number] Date of Birth/Sex: Treating RN: [birth year] (88 y.o. [name], Meta.Reding Primary Care Provider: Marda Stalker Other Clinician: Referring Provider: Treating Provider/Extender: Marjo Bicker Weeks in Treatment: 1 Diagnosis Coding ICD-10 Codes Code Description [phone] Chronic venous hypertension (idiopathic) with ulcer of left lower extremity L97.822 Non-pressure chronic ulcer of other part of left lower leg with fat layer exposed C50.919 Malignant neoplasm of unspecified site of unspecified female breast I50.42 Chronic combined systolic (congestive) and diastolic (congestive) heart failure G30.9 Alzheimer's disease, unspecified C54.1 Malignant neoplasm of endometrium Facility Procedures CPT4 Code: [id number] Description: 61950 - DEB SUBQ TISSUE  20 SQ CM/< ICD-10 Diagnosis Description L97.822 Non-pressure chronic ulcer of other part of left lower leg with fat layer exp Modifier: osed Quantity: 1 Physician Procedures : CPT4 Code Description Modifier [id number] - WC PHYS SUBQ TISS 20 SQ CM ICD-10 Diagnosis Description L97.822 Non-pressure chronic ulcer of other part of left lower leg with fat layer exposed Quantity: 1 Electronic Signature(s) Signed: 12/09/2021 12:02:35 PM By: Deon Pilling RN, BSN Signed: 12/10/2021 9:21:14 AM By: Kalman Shan DO Previous Signature: 12/04/2021 9:38:27 AM  Version By: Kalman Shan DO Entered By: Deon Pilling on 12/09/2021 12:01:55

## 2021-12-08 NOTE — Progress Notes (Signed)
[name], [name] ([id number]) Visit Report for 12/04/2021 Arrival Information Details Patient Name: Date of Service: [name], [name] 12/04/2021 8:45 A M Medical Record Number: [id number] Patient Account Number: [id number] Date of Birth/Sex: Treating RN: 12/27/1924 (88 y.o. [name] Primary Care Elexia Friedt: Marda Stalker Other Clinician: Referring Vivaan Helseth: Treating Presli Fanguy/Extender: Aviva Signs in Treatment: 1 Visit Information History Since Last Visit Added or deleted any medications: No Patient Arrived: Ambulatory Any new allergies or adverse reactions: No Arrival Time: 08:43 Had a fall or experienced change in No Accompanied By: daughter activities of daily living that may affect Transfer Assistance: Manual risk of falls: Patient Identification Verified: Yes Signs or symptoms of abuse/neglect since last visito No Secondary Verification Process Completed: Yes Hospitalized since last visit: No Patient Requires Transmission-Based Precautions: No Implantable device outside of the clinic excluding No Patient Has Alerts: No cellular tissue based products placed in the center since last visit: Has Dressing in Place as Prescribed: Yes Pain Present Now: No Electronic Signature(s) Signed: 12/04/2021 9:17:29 AM By: Sandre Kitty Entered By: Sandre Kitty on 12/04/2021 08:43:30 -------------------------------------------------------------------------------- Clinic Level of Care Assessment Details Patient Name: Date of Service: [name], [name] 12/04/2021 8:45 A M Medical Record Number: [id number] Patient Account Number: [id number] Date of Birth/Sex: Treating RN: 11-Nov-1925 (88 y.o. [name] Primary Care Ligia Duguay: Marda Stalker Other Clinician: Referring Raveena Hebdon: Treating Kyara Boxer/Extender: Aviva Signs in Treatment: 1 Clinic Level of Care Assessment Items TOOL 4 Quantity Score X- 1 0 Use  when only an EandM is performed on FOLLOW-UP visit ASSESSMENTS - Nursing Assessment / Reassessment X- 1 10 Reassessment of Co-morbidities (includes updates in patient status) X- 1 5 Reassessment of Adherence to Treatment Plan ASSESSMENTS - Wound and Skin A ssessment / Reassessment X - Simple Wound Assessment / Reassessment - one wound 1 5 []  - 0 Complex Wound Assessment / Reassessment - multiple wounds X- 1 10 Dermatologic / Skin Assessment (not related to wound area) ASSESSMENTS - Focused Assessment X- 1 5 Circumferential Edema Measurements - multi extremities X- 1 10 Nutritional Assessment / Counseling / Intervention []  - 0 Lower Extremity Assessment (monofilament, tuning fork, pulses) []  - 0 Peripheral Arterial Disease Assessment (using hand held doppler) ASSESSMENTS - Ostomy and/or Continence Assessment and Care []  - 0 Incontinence Assessment and Management []  - 0 Ostomy Care Assessment and Management (repouching, etc.) PROCESS - Coordination of Care X - Simple Patient / Family Education for ongoing care 1 15 []  - 0 Complex (extensive) Patient / Family Education for ongoing care X- 1 10 Staff obtains Programmer, systems, Records, T Results / Process Orders est X- 1 10 Staff telephones HHA, Nursing Homes / Clarify orders / etc []  - 0 Routine Transfer to another Facility (non-emergent condition) []  - 0 Routine Hospital Admission (non-emergent condition) []  - 0 New Admissions / Biomedical engineer / Ordering NPWT Apligraf, etc. , []  - 0 Emergency Hospital Admission (emergent condition) X- 1 10 Simple Discharge Coordination []  - 0 Complex (extensive) Discharge Coordination PROCESS - Special Needs []  - 0 Pediatric / Minor Patient Management []  - 0 Isolation Patient Management []  - 0 Hearing / Language / Visual special needs []  - 0 Assessment of Community assistance (transportation, D/C planning, etc.) []  - 0 Additional assistance / Altered mentation []  - 0 Support  Surface(s) Assessment (bed, cushion, seat, etc.) INTERVENTIONS - Wound Cleansing / Measurement X - Simple Wound Cleansing - one wound 1 5 []  - 0 Complex Wound Cleansing - multiple wounds X- 1 5  Wound Imaging (photographs - any number of wounds) []  - 0 Wound Tracing (instead of photographs) X- 1 5 Simple Wound Measurement - one wound []  - 0 Complex Wound Measurement - multiple wounds INTERVENTIONS - Wound Dressings []  - 0 Small Wound Dressing one or multiple wounds X- 1 15 Medium Wound Dressing one or multiple wounds []  - 0 Large Wound Dressing one or multiple wounds X- 1 5 Application of Medications - topical []  - 0 Application of Medications - injection INTERVENTIONS - Miscellaneous []  - 0 External ear exam []  - 0 Specimen Collection (cultures, biopsies, blood, body fluids, etc.) X- 1 5 Specimen(s) / Culture(s) sent or taken to Lab for analysis []  - 0 Patient Transfer (multiple staff / Civil Service fast streamer / Similar devices) []  - 0 Simple Staple / Suture removal (25 or less) []  - 0 Complex Staple / Suture removal (26 or more) []  - 0 Hypo / Hyperglycemic Management (close monitor of Blood Glucose) []  - 0 Ankle / Brachial Index (ABI) - do not check if billed separately X- 1 5 Vital Signs Has the patient been seen at the hospital within the last three years: Yes Total Score: 135 Level Of Care: New/Established - Level 4 Electronic Signature(s) Signed: 12/04/2021 1:40:06 PM By: Deon Pilling RN, BSN Entered By: Deon Pilling on 12/04/2021 09:13:55 -------------------------------------------------------------------------------- Encounter Discharge Information Details Patient Name: Date of Service: [name], CHINCHILLA [name] 12/04/2021 8:45 A M Medical Record Number: [id number] Patient Account Number: [id number] Date of Birth/Sex: Treating RN: 02-14-1925 (88 y.o. [name] Primary Care Somtochukwu Woollard: Marda Stalker Other Clinician: Referring Renold Kozar: Treating Khamiyah Grefe/Extender:  Aviva Signs in Treatment: 1 Encounter Discharge Information Items Post Procedure Vitals Discharge Condition: Stable Temperature (F): 98.2 Ambulatory Status: Wheelchair Pulse (bpm): 73 Discharge Destination: Home Respiratory Rate (breaths/min): 16 Transportation: Private Auto Blood Pressure (mmHg): 135/68 Accompanied By: daughter Schedule Follow-up Appointment: Yes Clinical Summary of Care: Electronic Signature(s) Signed: 12/04/2021 1:40:06 PM By: Deon Pilling RN, BSN Entered By: Deon Pilling on 12/04/2021 13:00:50 -------------------------------------------------------------------------------- Lower Extremity Assessment Details Patient Name: Date of Service: [name], AGUINIGA [name] 12/04/2021 8:45 A M Medical Record Number: [id number] Patient Account Number: [id number] Date of Birth/Sex: Treating RN: 1925/05/17 (88 y.o. [name] Primary Care Aerabella Galasso: Marda Stalker Other Clinician: Referring Dayzee Trower: Treating Nikolos Billig/Extender: Marjo Bicker Weeks in Treatment: 1 Edema Assessment Assessed: [Left: Yes] [Right: No] Edema: [Left: Ye] [Right: s] Calf Left: Right: Point of Measurement: 30 cm From Medial Instep 35 cm Ankle Left: Right: Point of Measurement: 11 cm From Medial Instep 22 cm Vascular Assessment Pulses: Dorsalis Pedis Palpable: [Left:Yes] Electronic Signature(s) Signed: 12/04/2021 1:40:06 PM By: Deon Pilling RN, BSN Entered By: Deon Pilling on 12/04/2021 08:46:58 -------------------------------------------------------------------------------- Multi Wound Chart Details Patient Name: Date of Service: [name], MANSKE [name] 12/04/2021 8:45 A M Medical Record Number: [id number] Patient Account Number: [id number] Date of Birth/Sex: Treating RN: [birth year] (88 y.o. [name] Primary Care Jedd Schulenburg: Marda Stalker Other Clinician: Referring Sage Kopera: Treating Clavin Ruhlman/Extender: Marjo Bicker Weeks in Treatment: 1 Vital Signs Height(in): 66 Pulse(bpm): 39 Weight(lbs): 176 Blood Pressure(mmHg): 135/68 Body Mass Index(BMI): 28 Temperature(F): 98.2 Respiratory Rate(breaths/min): 16 Photos: [N/A:N/A] Left, Lateral Ankle N/A N/A Wound Location: Gradually Appeared N/A N/A Wounding Event: Venous Leg Ulcer N/A N/A Primary Etiology: Anemia, Congestive Heart Failure, N/A N/A Comorbid History: Hypertension, Peripheral Venous Disease, Osteoarthritis, Received Chemotherapy 11/20/2021 N/A N/A Date Acquired: 1 N/A N/A Weeks of Treatment: Open N/A N/A Wound Status: 4x4.3x0.1 N/A N/A Measurements L x W x D (  cm) 13.509 N/A N/A A (cm) : rea 1.351 N/A N/A Volume (cm) : -43.30% N/A N/A % Reduction in Area: -43.40% N/A N/A % Reduction in Volume: Full Thickness Without Exposed N/A N/A Classification: Support Structures Large N/A N/A Exudate Amount: Serosanguineous N/A N/A Exudate Type: red, brown N/A N/A Exudate Color: Yes N/A N/A Foul Odor A Cleansing: fter No N/A N/A Odor Anticipated Due to Product Use: Distinct, outline attached N/A N/A Wound Margin: Medium (34-66%) N/A N/A Granulation A mount: Red N/A N/A Granulation Quality: Medium (34-66%) N/A N/A Necrotic Amount: Fat Layer (Subcutaneous Tissue): Yes N/A N/A Exposed Structures: Fascia: No Tendon: No Muscle: No Joint: No Bone: No None N/A N/A Epithelialization: Debridement - Excisional N/A N/A Debridement: 08:50 N/A N/A Pre-procedure Verification/Time Out Taken: Other N/A N/A Pain Control: Subcutaneous, Slough N/A N/A Tissue Debrided: Skin/Subcutaneous Tissue N/A N/A Level: 17.2 N/A N/A Debridement A (sq cm): rea Curette N/A N/A Instrument: Swab N/A N/A Specimen: 1 N/A N/A Number of Specimens Taken: Minimum N/A N/A Bleeding: Pressure N/A N/A Hemostasis A chieved: 0 N/A N/A Procedural Pain: 0 N/A N/A Post Procedural Pain: Procedure was tolerated  well N/A N/A Debridement Treatment Response: 4x4.3x0.1 N/A N/A Post Debridement Measurements L x W x D (cm) 1.351 N/A N/A Post Debridement Volume: (cm) Debridement N/A N/A Procedures Performed: Treatment Notes Electronic Signature(s) Signed: 12/04/2021 9:38:27 AM By: Kalman Shan DO Signed: 12/08/2021 11:39:47 AM By: Baruch Gouty RN, BSN Entered By: Kalman Shan on 12/04/2021 09:32:00 -------------------------------------------------------------------------------- Multi-Disciplinary Care Plan Details Patient Name: Date of Service: [name], MEINEKE [name] 12/04/2021 8:45 A M Medical Record Number: [id number] Patient Account Number: [id number] Date of Birth/Sex: Treating RN: [birth year] (88 y.o. [name] Primary Care Matina Rodier: Marda Stalker Other Clinician: Referring Skyla Champagne: Treating Taeshawn Helfman/Extender: Aviva Signs in Treatment: 1 Active Inactive Nutrition Nursing Diagnoses: Imbalanced nutrition Goals: Patient/caregiver agrees to and verbalizes understanding of need to use nutritional supplements and/or vitamins as prescribed Date Initiated: 11/27/2021 Target Resolution Date: 12/25/2021 Goal Status: Active Interventions: Assess patient nutrition upon admission and as needed per policy Provide education on nutrition Treatment Activities: Education provided on Nutrition : 11/27/2021 Notes: Wound/Skin Impairment Nursing Diagnoses: Impaired tissue integrity Goals: Patient/caregiver will verbalize understanding of skin care regimen Date Initiated: 11/27/2021 Target Resolution Date: 12/25/2021 Goal Status: Active Ulcer/skin breakdown will have a volume reduction of 30% by week 4 Date Initiated: 11/27/2021 Target Resolution Date: 12/25/2021 Goal Status: Active Interventions: Assess patient/caregiver ability to obtain necessary supplies Assess patient/caregiver ability to perform ulcer/skin care regimen upon admission and as needed Assess  ulceration(s) every visit Provide education on ulcer and skin care Treatment Activities: Topical wound management initiated : 11/27/2021 Notes: Electronic Signature(s) Signed: 12/04/2021 1:40:06 PM By: Deon Pilling RN, BSN Entered By: Deon Pilling on 12/04/2021 08:47:43 -------------------------------------------------------------------------------- Pain Assessment Details Patient Name: Date of Service: [name], [name] 12/04/2021 8:45 A M Medical Record Number: [id number] Patient Account Number: [id number] Date of Birth/Sex: Treating RN: 06/04/1925 (88 y.o. [name] Primary Care Chazlyn Cude: Marda Stalker Other Clinician: Referring Kevin Space: Treating Eliannah Hinde/Extender: Marjo Bicker Weeks in Treatment: 1 Active Problems Location of Pain Severity and Description of Pain Patient Has Paino No Site Locations Pain Management and Medication Current Pain Management: Electronic Signature(s) Signed: 12/04/2021 9:17:29 AM By: Sandre Kitty Signed: 12/08/2021 11:39:47 AM By: Baruch Gouty RN, BSN Entered By: Sandre Kitty on 12/04/2021 08:43:58 -------------------------------------------------------------------------------- Patient/Caregiver Education Details Patient Name: Date of Service: [name] 1/13/2023andnbsp8:45 [place] Record Number: [id number] Patient Account Number: [id number] Date  of Birth/Gender: Treating RN: 04-Nov-1925 (88 y.o. [name] Primary Care Physician: Marda Stalker Other Clinician: Referring Physician: Treating Physician/Extender: Aviva Signs in Treatment: 1 Education Assessment Education Provided To: Patient and Caregiver Education Topics Provided Wound/Skin Impairment: Handouts: Skin Care Do's and Dont's Methods: Explain/Verbal Responses: Reinforcements needed Electronic Signature(s) Signed: 12/04/2021 1:40:06 PM By: Deon Pilling RN, BSN Entered By: Deon Pilling on 12/04/2021 08:47:55 -------------------------------------------------------------------------------- Wound Assessment Details Patient Name: Date of Service: [name], BERTAGNOLLI [name] 12/04/2021 8:45 A M Medical Record Number: [id number] Patient Account Number: [id number] Date of Birth/Sex: Treating RN: 09-10-1925 (88 y.o. Martyn Malay, Linda Primary Care Sylvie Mifsud: Marda Stalker Other Clinician: Referring Casimer Russett: Treating Jashan Cotten/Extender: Marjo Bicker Weeks in Treatment: 1 Wound Status Wound Number: [id number] Primary Venous Leg Ulcer Etiology: Wound Location: Left, Lateral Ankle Wound Open Wounding Event: Gradually Appeared Status: Date Acquired: 11/20/2021 Comorbid Anemia, Congestive Heart Failure, Hypertension, Peripheral Weeks Of Treatment: 1 History: Venous Disease, Osteoarthritis, Received Chemotherapy Clustered Wound: No Photos Wound Measurements Length: (cm) 4 Width: (cm) 4.3 Depth: (cm) 0.1 Area: (cm) 13.509 Volume: (cm) 1.351 % Reduction in Area: -43.3% % Reduction in Volume: -43.4% Epithelialization: None Tunneling: No Undermining: No Wound Description Classification: Full Thickness Without Exposed Support Structu Wound Margin: Distinct, outline attached Exudate Amount: Large Exudate Type: Serosanguineous Exudate Color: red, brown res Foul Odor After Cleansing: Yes Due to Product Use: No Slough/Fibrino Yes Wound Bed Granulation Amount: Medium (34-66%) Exposed Structure Granulation Quality: Red Fascia Exposed: No Necrotic Amount: Medium (34-66%) Fat Layer (Subcutaneous Tissue) Exposed: Yes Necrotic Quality: Adherent Slough Tendon Exposed: No Muscle Exposed: No Joint Exposed: No Bone Exposed: No Treatment Notes Wound #26 (Ankle) Wound Laterality: Left, Lateral Cleanser Soap and Water Discharge Instruction: May shower and wash wound with dial antibacterial soap and water prior to dressing change. Peri-Wound Care Skin  Prep Discharge Instruction: Use skin prep as directed Sween Lotion (Moisturizing lotion) Discharge Instruction: Apply moisturizing lotion as directed Topical Gentamicin Discharge Instruction: Apply directly to wound bed. Primary Dressing Maxorb Extra Calcium Alginate Dressing, 4x4 in Discharge Instruction: Apply calcium alginate to wound bed as instructed Secondary Dressing Woven Gauze Sponge, Non-Sterile 4x4 in Discharge Instruction: Apply over primary dressing as directed. Bordered Gauze, 6x6 (in/in) Discharge Instruction: Apply over primary dressing as directed. Secured With Compression Wrap Compression Stockings Environmental education officer) Signed: 12/04/2021 1:40:06 PM By: Deon Pilling RN, BSN Signed: 12/08/2021 11:39:47 AM By: Baruch Gouty RN, BSN Entered By: Deon Pilling on 12/04/2021 08:47:20 -------------------------------------------------------------------------------- Vitals Details Patient Name: Date of Service: [name], GRUMBINE [name] 12/04/2021 8:45 A M Medical Record Number: [id number] Patient Account Number: [id number] Date of Birth/Sex: Treating RN: July 29, 1925 (88 y.o. [name] Primary Care Steffie Waggoner: Marda Stalker Other Clinician: Referring Kesleigh Morson: Treating Braidan Ricciardi/Extender: Aviva Signs in Treatment: 1 Vital Signs Time Taken: 08:43 Temperature (F): 98.2 Height (in): 66 Pulse (bpm): 73 Weight (lbs): 176 Respiratory Rate (breaths/min): 16 Body Mass Index (BMI): 28.4 Blood Pressure (mmHg): 135/68 Reference Range: 80 - 120 mg / dl Electronic Signature(s) Signed: 12/04/2021 9:17:29 AM By: Sandre Kitty Entered By: Sandre Kitty on 12/04/2021 08:43:52

## 2021-12-10 ENCOUNTER — Telehealth: Payer: Self-pay | Admitting: Family Medicine

## 2021-12-10 NOTE — Telephone Encounter (Signed)
Called the pt to inform her that the letter has been completed. Advised the letter will be at the front for pick up

## 2021-12-10 NOTE — Telephone Encounter (Signed)
We will work on completing a note for the pt

## 2021-12-11 ENCOUNTER — Encounter (HOSPITAL_BASED_OUTPATIENT_CLINIC_OR_DEPARTMENT_OTHER): Payer: Medicare PPO | Admitting: Internal Medicine

## 2021-12-11 ENCOUNTER — Other Ambulatory Visit: Payer: Self-pay

## 2021-12-11 DIAGNOSIS — L97822 Non-pressure chronic ulcer of other part of left lower leg with fat layer exposed: Secondary | ICD-10-CM | POA: Diagnosis not present

## 2021-12-11 DIAGNOSIS — C541 Malignant neoplasm of endometrium: Secondary | ICD-10-CM | POA: Diagnosis not present

## 2021-12-11 DIAGNOSIS — I5042 Chronic combined systolic (congestive) and diastolic (congestive) heart failure: Secondary | ICD-10-CM

## 2021-12-11 DIAGNOSIS — I87312 Chronic venous hypertension (idiopathic) with ulcer of left lower extremity: Secondary | ICD-10-CM | POA: Diagnosis not present

## 2021-12-11 DIAGNOSIS — G309 Alzheimer's disease, unspecified: Secondary | ICD-10-CM

## 2021-12-11 DIAGNOSIS — Z853 Personal history of malignant neoplasm of breast: Secondary | ICD-10-CM | POA: Diagnosis not present

## 2021-12-11 DIAGNOSIS — I739 Peripheral vascular disease, unspecified: Secondary | ICD-10-CM | POA: Diagnosis not present

## 2021-12-11 DIAGNOSIS — C50919 Malignant neoplasm of unspecified site of unspecified female breast: Secondary | ICD-10-CM | POA: Diagnosis not present

## 2021-12-11 DIAGNOSIS — I110 Hypertensive heart disease with heart failure: Secondary | ICD-10-CM | POA: Diagnosis not present

## 2021-12-11 NOTE — Progress Notes (Signed)
[name], [name] ([id number]) Visit Report for 12/11/2021 Chief Complaint Document Details Patient Name: Date of Service: [name], [name] 12/11/2021 9:00 A M Medical Record Number: [id number] Patient Account Number: [id number] Date of Birth/Sex: Treating RN: Jul 14, 1925 (88 y.o. [name] Primary Care Provider: Marda Stalker Other Clinician: Referring Provider: Treating Provider/Extender: Aviva Signs in Treatment: 2 Information Obtained from: Patient Chief Complaint Left lower extremity wound Electronic Signature(s) Signed: 12/11/2021 11:23:15 AM By: Kalman Shan DO Entered By: Kalman Shan on 12/11/2021 11:14:51 -------------------------------------------------------------------------------- HPI Details Patient Name: Date of Service: [name], [name] [name] 12/11/2021 9:00 [place] Record Number: [id number] Patient Account Number: [id number] Date of Birth/Sex: Treating RN: 01-24-1925 (88 y.o. [name] Primary Care Provider: Marda Stalker Other Clinician: Referring Provider: Treating Provider/Extender: Marjo Bicker Weeks in Treatment: 2 History of Present Illness HPI Description: this is a patient we know from several prior wounds on her bilateral lower extremities. She has venous stasis physiology, inflammation and hypertension. She is been fully evaluated for venous ablation and was found not to be a candidate. She does not have significant arterial disease. 02/14/15 the wound itself on her lateral left leg did not look too bad however there was surrounding maceration which was concerning 02/27/15 the wound itself is small with surrounding circumferential epithelialization there is no surrounding maceration. 03/06/15 only a very small area remains. I think this is mostly epithelialized however I would be uncomfortable not dressing this this week 03/13/15 the area is epithelialized. There was a thick  surface on this. I took a #15 blade then lightly disrupted it but there does not appear to be any open area I did not see anything but appropriate epithelium Readmission: 08/16/18 on evaluation today patient presents for initial evaluation and our clinic released readmission although she has not been seen since April 2016. Nonetheless she is having issues with the ulcers currently on the bilateral lateral malleolus or locations as well as the left lateral lower extremity. These have been present for roughly 3 weeks since the patient actually developed significant bilateral lower extremity edema which patient's daughter states was roughly 3 times the size of her legs currently. Subsequently she ended up in the emerging department due to congestive heart failure. They were able to get the swelling under control and fortunately she seems to be doing much better at this point in time. She was placed in an [employer] which she sat on for week part evaluation today as well. Fortunately the wound areas actually seem to be doing fairly well there is some macerated skin surrounding there are the areas/surface of the wound which are gonna require some debridement at this point. No fevers, chills, nausea, or vomiting noted at this time. Patient has a history of hypertension, mild peripheral vascular disease, chronic venous stasis, and other than the ulcer seems to be doing fairly well today. No fevers, chills, nausea, or vomiting noted at this time. 08/23/18 on evaluation today patient actually appears to be doing great in regard to her bilateral lower should be ulcers. She's shown signs of improvement even just with one week with the [employer]. In general I'm actually very pleased with how things appear at this point. 09/06/18 on evaluation today patient actually appears to be doing much better her left lower extremity is completely healed. Go to the right lower extremity lateral malleolus ulcer this  still seems to be showing signs of being open. There does not appear to be evidence  of infection which is good news. This did require some sharp debridement however to remove some of the necrotic tissue/eschar on the surface of the wound Readmission 11/27/2021 [name] is a 88 year old female with a past medical history of endometrial and breast cancer, chronic venous insufficiency, dementia and essential hypertension that presents to the clinic with a 1 month history of nonhealing ulcer to the left lower extremity. She states that the ulcer opened 1 month ago and then healed but has reopened again in the past week. She is currently keeping the area covered. She has a history of wounds to her lower extremities. She has compression stockings that she uses daily. She currently denies signs of infection. 1/13; patient presents for follow-up. She followed up for nurse visit for wrap change. She reports more odor. She denies pain. 1/20; patient presents for follow-up. She has been using gentamicin ointment with calcium alginate to the wound bed. She reports improvement in odor and drainage. She currently denies systemic signs of infection. Electronic Signature(s) Signed: 12/11/2021 11:23:15 AM By: Kalman Shan DO Entered By: Kalman Shan on 12/11/2021 11:16:14 -------------------------------------------------------------------------------- Physical Exam Details Patient Name: Date of Service: [name], [name] [name] 12/11/2021 9:00 A M Medical Record Number: [id number] Patient Account Number: [id number] Date of Birth/Sex: Treating RN: 1925/11/19 (88 y.o. [name] Primary Care Provider: Marda Stalker Other Clinician: Referring Provider: Treating Provider/Extender: Marjo Bicker Weeks in Treatment: 2 Constitutional respirations regular, non-labored and within target range for patient.. Cardiovascular 2+ dorsalis pedis/posterior tibialis  pulses. Psychiatric pleasant and cooperative. Notes Left lower extremity: T the distal lateral aspect there is an open wound with granulation tissue and scant nonviable tissue with circumferential maceration. No o drainage noted. No odor noted. Electronic Signature(s) Signed: 12/11/2021 11:23:15 AM By: Kalman Shan DO Entered By: Kalman Shan on 12/11/2021 11:18:38 -------------------------------------------------------------------------------- Physician Orders Details Patient Name: Date of Service: SHAMERIA, TRIMARCO [name] 12/11/2021 9:00 A M Medical Record Number: [id number] Patient Account Number: [id number] Date of Birth/Sex: Treating RN: 05/02/1925 (88 y.o. [name], Meta.Reding Primary Care Provider: Marda Stalker Other Clinician: Referring Provider: Treating Provider/Extender: Aviva Signs in Treatment: 2 Verbal / Phone Orders: No Diagnosis Coding ICD-10 Coding Code Description [phone] Chronic venous hypertension (idiopathic) with ulcer of left lower extremity L97.822 Non-pressure chronic ulcer of other part of left lower leg with fat layer exposed C50.919 Malignant neoplasm of unspecified site of unspecified female breast I50.42 Chronic combined systolic (congestive) and diastolic (congestive) heart failure G30.9 Alzheimer's disease, unspecified C54.1 Malignant neoplasm of endometrium Follow-up Appointments ppointment in 1 week. - Friday with Dr. Heber [place] Return A ***Call [facility] about the topical medication. Once it arrives bring into wound center with each visit. Other: - [employer] wound care supply company. Bathing/ Shower/ Hygiene May shower and wash wound with soap and water. - with dressing changes may wash with soap and water. Edema Control - Lymphedema / SCD / Other Elevate legs to the level of the heart or above for 30 minutes daily and/or when sitting, a frequency of: Avoid standing for long periods of  time. Patient to wear own compression stockings every day. - apply in the morning and remove at night to both legs. Exercise regularly Moisturize legs daily. - every night before day. Additional Orders / Instructions Follow Nutritious Diet - High Protein Diet Home Health Admit to [facility] for wound care. May utilize formulary equivalent dressing for wound treatment orders unless otherwise specified. New wound care orders this week; continue Home  Health for wound care. May utilize formulary equivalent dressing for wound treatment orders unless otherwise specified. - Home Health for skilled wound care nursing twice a week and wound center weekly. Wound Treatment Wound #26 - Ankle Wound Laterality: Left, Lateral Cleanser: Soap and Water 1 x Per Day/30 Days Discharge Instructions: May shower and wash wound with dial antibacterial soap and water prior to dressing change. Peri-Wound Care: Zinc Oxide Ointment 30g tube 1 x Per Day/30 Days Discharge Instructions: Apply Zinc Oxide to periwound with each dressing change Peri-Wound Care: Sween Lotion (Moisturizing lotion) 1 x Per Day/30 Days Discharge Instructions: Apply moisturizing lotion as directed Topical: Gentamicin 1 x Per Day/30 Days Discharge Instructions: Apply directly to wound bed. Prim Dressing: Maxorb Extra Calcium Alginate Dressing, 4x4 in 1 x Per Day/30 Days ary Discharge Instructions: Apply calcium alginate to wound bed as instructed Secondary Dressing: Woven Gauze Sponge, Non-Sterile 4x4 in 1 x Per Day/30 Days Discharge Instructions: Apply over primary dressing as directed. Secondary Dressing: Bordered Gauze, 6x6 (in/in) 1 x Per Day/30 Days Discharge Instructions: Apply over primary dressing as directed. Electronic Signature(s) Signed: 12/11/2021 11:23:15 AM By: Kalman Shan DO Entered By: Kalman Shan on 12/11/2021 11:19:06 -------------------------------------------------------------------------------- Problem List  Details Patient Name: Date of Service: [name], [name] [name] 12/11/2021 9:00 A M Medical Record Number: [id number] Patient Account Number: [id number] Date of Birth/Sex: Treating RN: [birth year] (88 y.o. [name], Meta.Reding Primary Care Provider: Marda Stalker Other Clinician: Referring Provider: Treating Provider/Extender: Aviva Signs in Treatment: 2 Active Problems ICD-10 Encounter Code Description Active Date MDM Diagnosis I87.312 Chronic venous hypertension (idiopathic) with ulcer of left lower extremity 11/27/2021 No Yes L97.822 Non-pressure chronic ulcer of other part of left lower leg with fat layer exposed1/04/2022 No Yes C50.919 Malignant neoplasm of unspecified site of unspecified female breast 11/27/2021 No Yes I50.42 Chronic combined systolic (congestive) and diastolic (congestive) heart failure 11/27/2021 No Yes G30.9 Alzheimer's disease, unspecified 11/27/2021 No Yes C54.1 Malignant neoplasm of endometrium 11/27/2021 No Yes Inactive Problems Resolved Problems Electronic Signature(s) Signed: 12/11/2021 11:23:15 AM By: Kalman Shan DO Entered By: Kalman Shan on 12/11/2021 11:14:35 -------------------------------------------------------------------------------- Progress Note Details Patient Name: Date of Service: [name], [name] [name] 12/11/2021 9:00 [place] Record Number: [id number] Patient Account Number: [id number] Date of Birth/Sex: Treating RN: 03-17-1925 (88 y.o. [name] Primary Care Provider: Marda Stalker Other Clinician: Referring Provider: Treating Provider/Extender: Aviva Signs in Treatment: 2 Subjective Chief Complaint Information obtained from Patient Left lower extremity wound History of Present Illness (HPI) this is a patient we know from several prior wounds on her bilateral lower extremities. She has venous stasis physiology, inflammation and hypertension. She is been fully  evaluated for venous ablation and was found not to be a candidate. She does not have significant arterial disease. 02/14/15 the wound itself on her lateral left leg did not look too bad however there was surrounding maceration which was concerning 02/27/15 the wound itself is small with surrounding circumferential epithelialization there is no surrounding maceration. 03/06/15 only a very small area remains. I think this is mostly epithelialized however I would be uncomfortable not dressing this this week 03/13/15 the area is epithelialized. There was a thick surface on this. I took a #15 blade then lightly disrupted it but there does not appear to be any open area I did not see anything but appropriate epithelium Readmission: 08/16/18 on evaluation today patient presents for initial evaluation and our clinic released readmission although she has not been seen since  April 2016. Nonetheless she is having issues with the ulcers currently on the bilateral lateral malleolus or locations as well as the left lateral lower extremity. These have been present for roughly 3 weeks since the patient actually developed significant bilateral lower extremity edema which patient's daughter states was roughly 3 times the size of her legs currently. Subsequently she ended up in the emerging department due to congestive heart failure. They were able to get the swelling under control and fortunately she seems to be doing much better at this point in time. She was placed in an [employer] which she sat on for week part evaluation today as well. Fortunately the wound areas actually seem to be doing fairly well there is some macerated skin surrounding there are the areas/surface of the wound which are gonna require some debridement at this point. No fevers, chills, nausea, or vomiting noted at this time. Patient has a history of hypertension, mild peripheral vascular disease, chronic venous stasis, and other than the ulcer  seems to be doing fairly well today. No fevers, chills, nausea, or vomiting noted at this time. 08/23/18 on evaluation today patient actually appears to be doing great in regard to her bilateral lower should be ulcers. She's shown signs of improvement even just with one week with the [employer]. In general I'm actually very pleased with how things appear at this point. 09/06/18 on evaluation today patient actually appears to be doing much better her left lower extremity is completely healed. Go to the right lower extremity lateral malleolus ulcer this still seems to be showing signs of being open. There does not appear to be evidence of infection which is good news. This did require some sharp debridement however to remove some of the necrotic tissue/eschar on the surface of the wound Readmission 11/27/2021 [name] is a 88 year old female with a past medical history of endometrial and breast cancer, chronic venous insufficiency, dementia and essential hypertension that presents to the clinic with a 1 month history of nonhealing ulcer to the left lower extremity. She states that the ulcer opened 1 month ago and then healed but has reopened again in the past week. She is currently keeping the area covered. She has a history of wounds to her lower extremities. She has compression stockings that she uses daily. She currently denies signs of infection. 1/13; patient presents for follow-up. She followed up for nurse visit for wrap change. She reports more odor. She denies pain. 1/20; patient presents for follow-up. She has been using gentamicin ointment with calcium alginate to the wound bed. She reports improvement in odor and drainage. She currently denies systemic signs of infection. Patient History Information obtained from Patient. Family History Cancer - Child, Hypertension - Child,Father, Kidney Disease - Child, Stroke - Siblings,Child, Thyroid Problems - Child, No family history  of Diabetes, Heart Disease, Hereditary Spherocytosis, Lung Disease, Seizures, Tuberculosis. Social History Never smoker, Marital Status - Widowed, Alcohol Use - Never, Drug Use - No History, Caffeine Use - Never. Medical History Eyes Denies history of Cataracts, Glaucoma, Optic Neuritis Ear/Nose/Mouth/Throat Denies history of Chronic sinus problems/congestion, Middle ear problems Hematologic/Lymphatic Patient has history of Anemia Denies history of Hemophilia, Human Immunodeficiency Virus, Lymphedema, Sickle Cell Disease Respiratory Denies history of Aspiration, Asthma, Chronic Obstructive Pulmonary Disease (COPD), Pneumothorax, Sleep Apnea, Tuberculosis Cardiovascular Patient has history of Congestive Heart Failure, Hypertension, Peripheral Venous Disease Denies history of Angina, Arrhythmia, Coronary Artery Disease, Hypotension, Myocardial Infarction, Peripheral Arterial Disease, Phlebitis, Vasculitis Gastrointestinal  Denies history of Cirrhosis , Colitis, Crohnoos, Hepatitis A, Hepatitis B, Hepatitis C Endocrine Denies history of Type I Diabetes, Type II Diabetes Genitourinary Denies history of End Stage Renal Disease Immunological Denies history of Lupus Erythematosus, Raynaudoos, Scleroderma Integumentary (Skin) Denies history of History of Burn Musculoskeletal Patient has history of Osteoarthritis Denies history of Gout, Rheumatoid Arthritis, Osteomyelitis Neurologic Denies history of Neuropathy, Quadriplegia, Paraplegia, Seizure Disorder Oncologic Patient has history of Received Chemotherapy - Breast and Ovarian Cancer Denies history of Received Radiation Hospitalization/Surgery History - diverticulits. Objective Constitutional respirations regular, non-labored and within target range for patient.. Vitals Time Taken: 8:49 AM, Height: 66 in, Weight: 176 lbs, BMI: 28.4, Temperature: 98.3 F, Pulse: 66 bpm, Respiratory Rate: 16 breaths/min, Blood Pressure: 123/62  mmHg. Cardiovascular 2+ dorsalis pedis/posterior tibialis pulses. Psychiatric pleasant and cooperative. General Notes: Left lower extremity: T the distal lateral aspect there is an open wound with granulation tissue and scant nonviable tissue with circumferential o maceration. No drainage noted. No odor noted. Integumentary (Hair, Skin) Wound #26 status is Open. Original cause of wound was Gradually Appeared. The date acquired was: 11/20/2021. The wound has been in treatment 2 weeks. The wound is located on the Left,Lateral Ankle. The wound measures 4cm length x 4.3cm width x 0.1cm depth; 13.509cm^2 area and 1.351cm^3 volume. There is Fat Layer (Subcutaneous Tissue) exposed. There is no tunneling or undermining noted. There is a large amount of serosanguineous drainage noted. The wound margin is distinct with the outline attached to the wound base. There is large (67-100%) red granulation within the wound bed. There is a small (1-33%) amount of necrotic tissue within the wound bed including Adherent Slough. Assessment Active Problems ICD-10 Chronic venous hypertension (idiopathic) with ulcer of left lower extremity Non-pressure chronic ulcer of other part of left lower leg with fat layer exposed Malignant neoplasm of unspecified site of unspecified female breast Chronic combined systolic (congestive) and diastolic (congestive) heart failure Alzheimer's disease, unspecified Malignant neoplasm of endometrium Patient's wound has improved with the use of gentamicin and calcium alginate. There is no longer odor and the wound bed appears healthier with granulation tissue. She had a PCR culture and results were sent for [facility] antibiotics. Once she is able to obtain this we will apply this in office and use a compression wrap. For now I recommended continuing with gentamicin ointment, calcium alginate and adding zinc oxide to the macerated areas and use her compression stocking  daily. Plan Follow-up Appointments: Return Appointment in 1 week. - Friday with Dr. Heber [place] ***Call [facility] about the topical medication. Once it arrives bring into wound center with each visit. Other: - [employer] wound care supply company. Bathing/ Shower/ Hygiene: May shower and wash wound with soap and water. - with dressing changes may wash with soap and water. Edema Control - Lymphedema / SCD / Other: Elevate legs to the level of the heart or above for 30 minutes daily and/or when sitting, a frequency of: Avoid standing for long periods of time. Patient to wear own compression stockings every day. - apply in the morning and remove at night to both legs. Exercise regularly Moisturize legs daily. - every night before day. Additional Orders / Instructions: Follow Nutritious Diet - High Protein Diet Home Health: Admit to Home Health for wound care. May utilize formulary equivalent dressing for wound treatment orders unless otherwise specified. New wound care orders this week; continue Home Health for wound care. May utilize formulary equivalent dressing for wound treatment orders unless otherwise specified. -  Home Health for skilled wound care nursing twice a week and wound center weekly. WOUND #26: - Ankle Wound Laterality: Left, Lateral Cleanser: Soap and Water 1 x Per Day/30 Days Discharge Instructions: May shower and wash wound with dial antibacterial soap and water prior to dressing change. Peri-Wound Care: Zinc Oxide Ointment 30g tube 1 x Per Day/30 Days Discharge Instructions: Apply Zinc Oxide to periwound with each dressing change Peri-Wound Care: Sween Lotion (Moisturizing lotion) 1 x Per Day/30 Days Discharge Instructions: Apply moisturizing lotion as directed Topical: Gentamicin 1 x Per Day/30 Days Discharge Instructions: Apply directly to wound bed. Prim Dressing: Maxorb Extra Calcium Alginate Dressing, 4x4 in 1 x Per Day/30 Days ary Discharge  Instructions: Apply calcium alginate to wound bed as instructed Secondary Dressing: Woven Gauze Sponge, Non-Sterile 4x4 in 1 x Per Day/30 Days Discharge Instructions: Apply over primary dressing as directed. Secondary Dressing: Bordered Gauze, 6x6 (in/in) 1 x Per Day/30 Days Discharge Instructions: Apply over primary dressing as directed. 1. Gentamicin ointment, calcium alginate under compression stocking daily 2. Follow-up in 1 week Electronic Signature(s) Signed: 12/11/2021 11:23:15 AM By: Kalman Shan DO Entered By: Kalman Shan on 12/11/2021 11:22:08 -------------------------------------------------------------------------------- HxROS Details Patient Name: Date of Service: [name], [name] [name] 12/11/2021 9:00 A M Medical Record Number: [id number] Patient Account Number: [id number] Date of Birth/Sex: Treating RN: 02/06/1925 (88 y.o. [name] Primary Care Provider: Marda Stalker Other Clinician: Referring Provider: Treating Provider/Extender: Aviva Signs in Treatment: 2 Information Obtained From Patient Eyes Medical History: Negative for: Cataracts; Glaucoma; Optic Neuritis Ear/Nose/Mouth/Throat Medical History: Negative for: Chronic sinus problems/congestion; Middle ear problems Hematologic/Lymphatic Medical History: Positive for: Anemia Negative for: Hemophilia; Human Immunodeficiency Virus; Lymphedema; Sickle Cell Disease Respiratory Medical History: Negative for: Aspiration; Asthma; Chronic Obstructive Pulmonary Disease (COPD); Pneumothorax; Sleep Apnea; Tuberculosis Cardiovascular Medical History: Positive for: Congestive Heart Failure; Hypertension; Peripheral Venous Disease Negative for: Angina; Arrhythmia; Coronary Artery Disease; Hypotension; Myocardial Infarction; Peripheral Arterial Disease; Phlebitis; Vasculitis Gastrointestinal Medical History: Negative for: Cirrhosis ; Colitis; Crohns; Hepatitis A; Hepatitis  B; Hepatitis C Endocrine Medical History: Negative for: Type I Diabetes; Type II Diabetes Genitourinary Medical History: Negative for: End Stage Renal Disease Immunological Medical History: Negative for: Lupus Erythematosus; Raynauds; Scleroderma Integumentary (Skin) Medical History: Negative for: History of Burn Musculoskeletal Medical History: Positive for: Osteoarthritis Negative for: Gout; Rheumatoid Arthritis; Osteomyelitis Neurologic Medical History: Negative for: Neuropathy; Quadriplegia; Paraplegia; Seizure Disorder Oncologic Medical History: Positive for: Received Chemotherapy - Breast and Ovarian Cancer Negative for: Received Radiation Immunizations Pneumococcal Vaccine: Received Pneumococcal Vaccination: Yes Received Pneumococcal Vaccination On or After 60th Birthday: Yes Implantable Devices None Hospitalization / Surgery History Type of Hospitalization/Surgery diverticulits Family and Social History Cancer: Yes - Child; Diabetes: No; Heart Disease: No; Hereditary Spherocytosis: No; Hypertension: Yes - Child,Father; Kidney Disease: Yes - Child; Lung Disease: No; Seizures: No; Stroke: Yes - Siblings,Child; Thyroid Problems: Yes - Child; Tuberculosis: No; Never smoker; Marital Status - Widowed; Alcohol Use: Never; Drug Use: No History; Caffeine Use: Never; Financial Concerns: No; Food, Clothing or Shelter Needs: No; Support System Lacking: No; Transportation Concerns: No Electronic Signature(s) Signed: 12/11/2021 11:23:15 AM By: Kalman Shan DO Signed: 12/11/2021 1:05:14 PM By: Baruch Gouty RN, BSN Entered By: Kalman Shan on 12/11/2021 11:16:24 -------------------------------------------------------------------------------- SuperBill Details Patient Name: Date of Service: [name], [name] [name] 12/11/2021 Medical Record Number: [id number] Patient Account Number: [id number] Date of Birth/Sex: Treating RN: [birth year] (88 y.o. [name], Tammi Klippel Primary Care  Provider: Marda Stalker Other Clinician: Referring Provider: Treating Provider/Extender: Leonie Green,  Courtney Weeks in Treatment: 2 Diagnosis Coding ICD-10 Codes Code Description I87.312 Chronic venous hypertension (idiopathic) with ulcer of left lower extremity L97.822 Non-pressure chronic ulcer of other part of left lower leg with fat layer exposed C50.919 Malignant neoplasm of unspecified site of unspecified female breast I50.42 Chronic combined systolic (congestive) and diastolic (congestive) heart failure G30.9 Alzheimer's disease, unspecified C54.1 Malignant neoplasm of endometrium Facility Procedures CPT4 Code: 32761470 Description: 99214 - WOUND CARE VISIT-LEV 4 EST PT Modifier: Quantity: 1 Physician Procedures : CPT4 Code Description Modifier [id number] - WC PHYS LEVEL 3 - EST PT ICD-10 Diagnosis Description I87.312 Chronic venous hypertension (idiopathic) with ulcer of left lower extremity L97.822 Non-pressure chronic ulcer of other part of left lower leg  with fat layer exposed I50.42 Chronic combined systolic (congestive) and diastolic (congestive) heart failure G30.9 Alzheimer's disease, unspecified Quantity: 1 Electronic Signature(s) Signed: 12/11/2021 11:23:15 AM By: Kalman Shan DO Entered By: Kalman Shan on 12/11/2021 11:22:57

## 2021-12-17 NOTE — Progress Notes (Signed)
LIZANNE, ERKER ([id number]) Visit Report for 12/11/2021 Arrival Information Details Patient Name: Date of Service: [name], [name] 12/11/2021 9:00 [place] Record Number: [id number] Patient Account Number: [id number] Date of Birth/Sex: Treating RN: Jun 27, 1925 (88 y.o. Female) Baruch Gouty Primary Care Shilah Hefel: Marda Stalker Other Clinician: Referring Laelyn Blumenthal: Treating Derwood Becraft/Extender: Aviva Signs in Treatment: 2 Visit Information History Since Last Visit Added or deleted any medications: No Patient Arrived: Wheel Chair Any new allergies or adverse reactions: No Arrival Time: 08:49 Had a fall or experienced change in No Accompanied By: daughter activities of daily living that may affect Transfer Assistance: Manual risk of falls: Patient Identification Verified: Yes Signs or symptoms of abuse/neglect since last visito No Secondary Verification Process Completed: Yes Hospitalized since last visit: No Patient Requires Transmission-Based Precautions: No Implantable device outside of the clinic excluding No Patient Has Alerts: No cellular tissue based products placed in the center since last visit: Has Dressing in Place as Prescribed: Yes Pain Present Now: No Electronic Signature(s) Signed: 12/17/2021 3:23:49 PM By: Sandre Kitty Entered By: Sandre Kitty on 12/11/2021 08:49:29 -------------------------------------------------------------------------------- Clinic Level of Care Assessment Details Patient Name: Date of Service: [name] 12/11/2021 9:00 [place] Record Number: [id number] Patient Account Number: [id number] Date of Birth/Sex: Treating RN: 04/06/1925 (88 y.o. Female) Deon Pilling Primary Care Kase Shughart: Marda Stalker Other Clinician: Referring Vandella Ord: Treating Josely Moffat/Extender: Aviva Signs in Treatment: 2 Clinic Level of Care Assessment Items TOOL 4 Quantity  Score X- 1 0 Use when only an EandM is performed on FOLLOW-UP visit ASSESSMENTS - Nursing Assessment / Reassessment X- 1 10 Reassessment of Co-morbidities (includes updates in patient status) X- 1 5 Reassessment of Adherence to Treatment Plan ASSESSMENTS - Wound and Skin A ssessment / Reassessment X - Simple Wound Assessment / Reassessment - one wound 1 5 []  - 0 Complex Wound Assessment / Reassessment - multiple wounds X- 1 10 Dermatologic / Skin Assessment (not related to wound area) ASSESSMENTS - Focused Assessment X- 1 5 Circumferential Edema Measurements - multi extremities X- 1 10 Nutritional Assessment / Counseling / Intervention []  - 0 Lower Extremity Assessment (monofilament, tuning fork, pulses) []  - 0 Peripheral Arterial Disease Assessment (using hand held doppler) ASSESSMENTS - Ostomy and/or Continence Assessment and Care []  - 0 Incontinence Assessment and Management []  - 0 Ostomy Care Assessment and Management (repouching, etc.) PROCESS - Coordination of Care []  - 0 Simple Patient / Family Education for ongoing care X- 1 20 Complex (extensive) Patient / Family Education for ongoing care X- 1 10 Staff obtains Programmer, systems, Records, T Results / Process Orders est []  - 0 Staff telephones HHA, Nursing Homes / Clarify orders / etc []  - 0 Routine Transfer to another Facility (non-emergent condition) []  - 0 Routine Hospital Admission (non-emergent condition) []  - 0 New Admissions / Biomedical engineer / Ordering NPWT Apligraf, etc. , []  - 0 Emergency Hospital Admission (emergent condition) []  - 0 Simple Discharge Coordination X- 1 15 Complex (extensive) Discharge Coordination PROCESS - Special Needs []  - 0 Pediatric / Minor Patient Management []  - 0 Isolation Patient Management []  - 0 Hearing / Language / Visual special needs []  - 0 Assessment of Community assistance (transportation, D/C planning, etc.) []  - 0 Additional assistance / Altered  mentation []  - 0 Support Surface(s) Assessment (bed, cushion, seat, etc.) INTERVENTIONS - Wound Cleansing / Measurement X - Simple Wound Cleansing - one wound 1 5 []  - 0 Complex Wound Cleansing - multiple wounds X- 1 5  Wound Imaging (photographs - any number of wounds) []  - 0 Wound Tracing (instead of photographs) X- 1 5 Simple Wound Measurement - one wound []  - 0 Complex Wound Measurement - multiple wounds INTERVENTIONS - Wound Dressings []  - 0 Small Wound Dressing one or multiple wounds X- 1 15 Medium Wound Dressing one or multiple wounds []  - 0 Large Wound Dressing one or multiple wounds []  - 0 Application of Medications - topical []  - 0 Application of Medications - injection INTERVENTIONS - Miscellaneous []  - 0 External ear exam []  - 0 Specimen Collection (cultures, biopsies, blood, body fluids, etc.) []  - 0 Specimen(s) / Culture(s) sent or taken to Lab for analysis []  - 0 Patient Transfer (multiple staff / Civil Service fast streamer / Similar devices) []  - 0 Simple Staple / Suture removal (25 or less) []  - 0 Complex Staple / Suture removal (26 or more) []  - 0 Hypo / Hyperglycemic Management (close monitor of Blood Glucose) []  - 0 Ankle / Brachial Index (ABI) - do not check if billed separately X- 1 5 Vital Signs Has the patient been seen at the hospital within the last three years: Yes Total Score: 125 Level Of Care: New/Established - Level 4 Electronic Signature(s) Signed: 12/11/2021 2:04:28 PM By: Deon Pilling RN, BSN Entered By: Deon Pilling on 12/11/2021 09:24:13 -------------------------------------------------------------------------------- Encounter Discharge Information Details Patient Name: Date of Service: [name] 12/11/2021 9:00 [place] Medical Record Number: [id number] Patient Account Number: [id number] Date of Birth/Sex: Treating RN: [birth year] (88 y.o. Female) Deon Pilling Primary Care Zymeir Salminen: Marda Stalker Other Clinician: Referring  Deandria Klute: Treating Brent Noto/Extender: Aviva Signs in Treatment: 2 Encounter Discharge Information Items Discharge Condition: Stable Ambulatory Status: Wheelchair Discharge Destination: Home Transportation: Private Auto Accompanied By: daughter Schedule Follow-up Appointment: Yes Clinical Summary of Care: Electronic Signature(s) Signed: 12/11/2021 2:04:28 PM By: Deon Pilling RN, BSN Entered By: Deon Pilling on 12/11/2021 09:25:10 -------------------------------------------------------------------------------- Lower Extremity Assessment Details Patient Name: Date of Service: CALLEEN, ALVIS [name] 12/11/2021 9:00 A M Medical Record Number: [id number] Patient Account Number: [id number] Date of Birth/Sex: Treating RN: May 06, 1925 (88 y.o. Female) Deon Pilling Primary Care Amoni Scallan: Marda Stalker Other Clinician: Referring Rolando Hessling: Treating Thomasena Vandenheuvel/Extender: Marjo Bicker Weeks in Treatment: 2 Edema Assessment Assessed: [Left: Yes] [Right: No] Edema: [Left: Ye] [Right: s] Calf Left: Right: Point of Measurement: 30 cm From Medial Instep 35 cm Ankle Left: Right: Point of Measurement: 11 cm From Medial Instep 22 cm Vascular Assessment Pulses: Dorsalis Pedis Palpable: [Left:Yes] Electronic Signature(s) Signed: 12/11/2021 2:04:28 PM By: Deon Pilling RN, BSN Entered By: Deon Pilling on 12/11/2021 09:02:05 -------------------------------------------------------------------------------- Multi Wound Chart Details Patient Name: Date of Service: NECHELLE, PETRIZZO [name] 12/11/2021 9:00 A M Medical Record Number: [id number] Patient Account Number: [id number] Date of Birth/Sex: Treating RN: 23-May-1925 (88 y.o. Female) Baruch Gouty Primary Care Ameenah Prosser: Marda Stalker Other Clinician: Referring Destry Bezdek: Treating Rateel Beldin/Extender: Marjo Bicker Weeks in Treatment: 2 Vital Signs Height(in): 66 Pulse(bpm):  38 Weight(lbs): 176 Blood Pressure(mmHg): 123/62 Body Mass Index(BMI): 28 Temperature(F): 98.3 Respiratory Rate(breaths/min): 16 Photos: [N/A:N/A] Left, Lateral Ankle N/A N/A Wound Location: Gradually Appeared N/A N/A Wounding Event: Venous Leg Ulcer N/A N/A Primary Etiology: Anemia, Congestive Heart Failure, N/A N/A Comorbid History: Hypertension, Peripheral Venous Disease, Osteoarthritis, Received Chemotherapy 11/20/2021 N/A N/A Date Acquired: 2 N/A N/A Weeks of Treatment: Open N/A N/A Wound Status: 4x4.3x0.1 N/A N/A Measurements L x W x D (cm) 13.509 N/A N/A A (cm) : rea 1.351 N/A N/A Volume (cm) : -43.30% N/A N/A %  Reduction in Area: -43.40% N/A N/A % Reduction in Volume: Full Thickness Without Exposed N/A N/A Classification: Support Structures Large N/A N/A Exudate Amount: Serosanguineous N/A N/A Exudate Type: red, brown N/A N/A Exudate Color: Distinct, outline attached N/A N/A Wound Margin: Large (67-100%) N/A N/A Granulation Amount: Red N/A N/A Granulation Quality: Small (1-33%) N/A N/A Necrotic Amount: Fat Layer (Subcutaneous Tissue): Yes N/A N/A Exposed Structures: Fascia: No Tendon: No Muscle: No Joint: No Bone: No None N/A N/A Epithelialization: Treatment Notes Wound #26 (Ankle) Wound Laterality: Left, Lateral Cleanser Soap and Water Discharge Instruction: May shower and wash wound with dial antibacterial soap and water prior to dressing change. Peri-Wound Care Zinc Oxide Ointment 30g tube Discharge Instruction: Apply Zinc Oxide to periwound with each dressing change Sween Lotion (Moisturizing lotion) Discharge Instruction: Apply moisturizing lotion as directed Topical Gentamicin Discharge Instruction: Apply directly to wound bed. Primary Dressing Maxorb Extra Calcium Alginate Dressing, 4x4 in Discharge Instruction: Apply calcium alginate to wound bed as instructed Secondary Dressing Woven Gauze Sponge, Non-Sterile 4x4  in Discharge Instruction: Apply over primary dressing as directed. Bordered Gauze, 6x6 (in/in) Discharge Instruction: Apply over primary dressing as directed. Secured With Compression Wrap Compression Stockings Add-Ons Electronic Signature(s) Signed: 12/11/2021 11:23:15 AM By: Kalman Shan DO Signed: 12/11/2021 1:05:14 PM By: Baruch Gouty RN, BSN Entered By: Kalman Shan on 12/11/2021 11:14:43 -------------------------------------------------------------------------------- [place] Details Patient Name: Date of Service: SHATERIA, PATERNOSTRO [name] 12/11/2021 9:00 A M Medical Record Number: [id number] Patient Account Number: [id number] Date of Birth/Sex: Treating RN: Mar 03, 1925 (88 y.o. Female) Deon Pilling Primary Care Dyke Weible: Marda Stalker Other Clinician: Referring Jud Fanguy: Treating Kayleen Alig/Extender: Aviva Signs in Treatment: 2 Active Inactive Nutrition Nursing Diagnoses: Imbalanced nutrition Goals: Patient/caregiver agrees to and verbalizes understanding of need to use nutritional supplements and/or vitamins as prescribed Date Initiated: 11/27/2021 Target Resolution Date: 12/25/2021 Goal Status: Active Interventions: Assess patient nutrition upon admission and as needed per policy Provide education on nutrition Treatment Activities: Education provided on Nutrition : 11/27/2021 Notes: Wound/Skin Impairment Nursing Diagnoses: Impaired tissue integrity Goals: Patient/caregiver will verbalize understanding of skin care regimen Date Initiated: 11/27/2021 Target Resolution Date: 12/25/2021 Goal Status: Active Ulcer/skin breakdown will have a volume reduction of 30% by week 4 Date Initiated: 11/27/2021 Target Resolution Date: 12/25/2021 Goal Status: Active Interventions: Assess patient/caregiver ability to obtain necessary supplies Assess patient/caregiver ability to perform ulcer/skin care regimen upon admission and as  needed Assess ulceration(s) every visit Provide education on ulcer and skin care Treatment Activities: Topical wound management initiated : 11/27/2021 Notes: Electronic Signature(s) Signed: 12/11/2021 2:04:28 PM By: Deon Pilling RN, BSN Entered By: Deon Pilling on 12/11/2021 09:03:37 -------------------------------------------------------------------------------- Pain Assessment Details Patient Name: Date of Service: ZURY, FAZZINO [name] 12/11/2021 9:00 [place] Record Number: [id number] Patient Account Number: [id number] Date of Birth/Sex: Treating RN: 04/14/1925 (88 y.o. Female) Baruch Gouty Primary Care Tiaria Biby: Marda Stalker Other Clinician: Referring Lerae Langham: Treating Royanne Warshaw/Extender: Marjo Bicker Weeks in Treatment: 2 Active Problems Location of Pain Severity and Description of Pain Patient Has Paino No Site Locations Pain Management and Medication Current Pain Management: Electronic Signature(s) Signed: 12/11/2021 1:05:14 PM By: Baruch Gouty RN, BSN Signed: 12/17/2021 3:23:49 PM By: Sandre Kitty Entered By: Sandre Kitty on 12/11/2021 08:49:54 -------------------------------------------------------------------------------- Patient/Caregiver Education Details Patient Name: Date of Service: Thalia Party [name] 1/20/2023andnbsp9:00 [place] Record Number: [id number] Patient Account Number: [id number] Date of Birth/Gender: Treating RN: 1925-03-30 (88 y.o. Female) Deon Pilling Primary Care Physician: Marda Stalker Other Clinician: Referring Physician: Treating Physician/Extender: Heber [place]  Ralene Ok, Delphina Cahill in Treatment: 2 Education Assessment Education Provided To: Patient Education Topics Provided Wound/Skin Impairment: Handouts: Skin Care Do's and Dont's Methods: Explain/Verbal Responses: Reinforcements needed Electronic Signature(s) Signed: 12/11/2021 2:04:28 PM By: Deon Pilling RN, BSN Entered  By: Deon Pilling on 12/11/2021 09:03:49 -------------------------------------------------------------------------------- Wound Assessment Details Patient Name: Date of Service: RHEYA, MINOGUE [name] 12/11/2021 9:00 A M Medical Record Number: [id number] Patient Account Number: [id number] Date of Birth/Sex: Treating RN: Jun 29, 1925 (88 y.o. Female) Baruch Gouty Primary Care Breeona Waid: Marda Stalker Other Clinician: Referring Niclas Markell: Treating Johnrobert Foti/Extender: Marjo Bicker Weeks in Treatment: 2 Wound Status Wound Number: 26 Primary Venous Leg Ulcer Etiology: Wound Location: Left, Lateral Ankle Wound Open Wounding Event: Gradually Appeared Status: Date Acquired: 11/20/2021 Comorbid Anemia, Congestive Heart Failure, Hypertension, Peripheral Weeks Of Treatment: 2 History: Venous Disease, Osteoarthritis, Received Chemotherapy Clustered Wound: No Photos Wound Measurements Length: (cm) 4 Width: (cm) 4.3 Depth: (cm) 0.1 Area: (cm) 13.509 Volume: (cm) 1.351 % Reduction in Area: -43.3% % Reduction in Volume: -43.4% Epithelialization: None Tunneling: No Undermining: No Wound Description Classification: Full Thickness Without Exposed Support Structures Wound Margin: Distinct, outline attached Exudate Amount: Large Exudate Type: Serosanguineous Exudate Color: red, brown Foul Odor After Cleansing: No Slough/Fibrino Yes Wound Bed Granulation Amount: Large (67-100%) Exposed Structure Granulation Quality: Red Fascia Exposed: No Necrotic Amount: Small (1-33%) Fat Layer (Subcutaneous Tissue) Exposed: Yes Necrotic Quality: Adherent Slough Tendon Exposed: No Muscle Exposed: No Joint Exposed: No Bone Exposed: No Treatment Notes Wound #26 (Ankle) Wound Laterality: Left, Lateral Cleanser Soap and Water Discharge Instruction: May shower and wash wound with dial antibacterial soap and water prior to dressing change. Peri-Wound Care Zinc Oxide Ointment  30g tube Discharge Instruction: Apply Zinc Oxide to periwound with each dressing change Sween Lotion (Moisturizing lotion) Discharge Instruction: Apply moisturizing lotion as directed Topical Gentamicin Discharge Instruction: Apply directly to wound bed. Primary Dressing Maxorb Extra Calcium Alginate Dressing, 4x4 in Discharge Instruction: Apply calcium alginate to wound bed as instructed Secondary Dressing Woven Gauze Sponge, Non-Sterile 4x4 in Discharge Instruction: Apply over primary dressing as directed. Bordered Gauze, 6x6 (in/in) Discharge Instruction: Apply over primary dressing as directed. Secured With Compression Wrap Compression Stockings Environmental education officer) Signed: 12/11/2021 1:05:14 PM By: Baruch Gouty RN, BSN Signed: 12/11/2021 2:04:28 PM By: Deon Pilling RN, BSN Entered By: Deon Pilling on 12/11/2021 09:02:54 -------------------------------------------------------------------------------- Vitals Details Patient Name: Date of Service: RAE, PLOTNER [name] 12/11/2021 9:00 A M Medical Record Number: [id number] Patient Account Number: [id number] Date of Birth/Sex: Treating RN: 01-Jan-1925 (88 y.o. Female) Baruch Gouty Primary Care Amarrah Meinhart: Marda Stalker Other Clinician: Referring Andon Villard: Treating Quamel Fitzmaurice/Extender: Marjo Bicker Weeks in Treatment: 2 Vital Signs Time Taken: 08:49 Temperature (F): 98.3 Height (in): 66 Pulse (bpm): 66 Weight (lbs): 176 Respiratory Rate (breaths/min): 16 Body Mass Index (BMI): 28.4 Blood Pressure (mmHg): 123/62 Reference Range: 80 - 120 mg / dl Electronic Signature(s) Signed: 12/17/2021 3:23:49 PM By: Sandre Kitty Entered By: Sandre Kitty on 12/11/2021 08:49:49

## 2021-12-18 ENCOUNTER — Other Ambulatory Visit: Payer: Self-pay

## 2021-12-18 ENCOUNTER — Encounter (HOSPITAL_BASED_OUTPATIENT_CLINIC_OR_DEPARTMENT_OTHER): Payer: Medicare PPO | Admitting: Internal Medicine

## 2021-12-18 DIAGNOSIS — L97822 Non-pressure chronic ulcer of other part of left lower leg with fat layer exposed: Secondary | ICD-10-CM | POA: Diagnosis not present

## 2021-12-18 DIAGNOSIS — I110 Hypertensive heart disease with heart failure: Secondary | ICD-10-CM | POA: Diagnosis not present

## 2021-12-18 DIAGNOSIS — Z853 Personal history of malignant neoplasm of breast: Secondary | ICD-10-CM | POA: Diagnosis not present

## 2021-12-18 DIAGNOSIS — I739 Peripheral vascular disease, unspecified: Secondary | ICD-10-CM | POA: Diagnosis not present

## 2021-12-18 DIAGNOSIS — C541 Malignant neoplasm of endometrium: Secondary | ICD-10-CM | POA: Diagnosis not present

## 2021-12-18 DIAGNOSIS — I87312 Chronic venous hypertension (idiopathic) with ulcer of left lower extremity: Secondary | ICD-10-CM | POA: Diagnosis not present

## 2021-12-18 DIAGNOSIS — I5042 Chronic combined systolic (congestive) and diastolic (congestive) heart failure: Secondary | ICD-10-CM | POA: Diagnosis not present

## 2021-12-18 DIAGNOSIS — G309 Alzheimer's disease, unspecified: Secondary | ICD-10-CM | POA: Diagnosis not present

## 2021-12-18 DIAGNOSIS — C50919 Malignant neoplasm of unspecified site of unspecified female breast: Secondary | ICD-10-CM | POA: Diagnosis not present

## 2021-12-22 NOTE — Progress Notes (Signed)
[name], LEWING ([id number]) Visit Report for 12/18/2021 Chief Complaint Document Details Patient Name: Date of Service: CHEVI, LIM 12/18/2021 9:15 A M Medical Record Number: [id number] Patient Account Number: [id number] Date of Birth/Sex: Treating RN: August 02, 1925 (88 y.o. Nancy Fetter Primary Care Provider: Marda Stalker Other Clinician: Referring Provider: Treating Provider/Extender: Aviva Signs in Treatment: 3 Information Obtained from: Patient Chief Complaint Left lower extremity wound Electronic Signature(s) Signed: 12/18/2021 11:49:58 AM By: Kalman Shan DO Entered By: Kalman Shan on 12/18/2021 10:01:32 -------------------------------------------------------------------------------- HPI Details Patient Name: Date of Service: [name], STALLINGS RNESTINE 12/18/2021 9:15 A M Medical Record Number: [id number] Patient Account Number: [id number] Date of Birth/Sex: Treating RN: 1925/03/16 (88 y.o. Nancy Fetter Primary Care Provider: Marda Stalker Other Clinician: Referring Provider: Treating Provider/Extender: Marjo Bicker Weeks in Treatment: 3 History of Present Illness HPI Description: this is a patient we know from several prior wounds on her bilateral lower extremities. She has venous stasis physiology, inflammation and hypertension. She is been fully evaluated for venous ablation and was found not to be a candidate. She does not have significant arterial disease. 02/14/15 the wound itself on her lateral left leg did not look too bad however there was surrounding maceration which was concerning 02/27/15 the wound itself is small with surrounding circumferential epithelialization there is no surrounding maceration. 03/06/15 only a very small area remains. I think this is mostly epithelialized however I would be uncomfortable not dressing this this week 03/13/15 the area is epithelialized. There was a thick surface  on this. I took a #15 blade then lightly disrupted it but there does not appear to be any open area I did not see anything but appropriate epithelium Readmission: 08/16/18 on evaluation today patient presents for initial evaluation and our clinic released readmission although she has not been seen since April 2016. Nonetheless she is having issues with the ulcers currently on the bilateral lateral malleolus or locations as well as the left lateral lower extremity. These have been present for roughly 3 weeks since the patient actually developed significant bilateral lower extremity edema which patient's daughter states was roughly 3 times the size of her legs currently. Subsequently she ended up in the emerging department due to congestive heart failure. They were able to get the swelling under control and fortunately she seems to be doing much better at this point in time. She was placed in an [employer] which she sat on for week part evaluation today as well. Fortunately the wound areas actually seem to be doing fairly well there is some macerated skin surrounding there are the areas/surface of the wound which are gonna require some debridement at this point. No fevers, chills, nausea, or vomiting noted at this time. Patient has a history of hypertension, mild peripheral vascular disease, chronic venous stasis, and other than the ulcer seems to be doing fairly well today. No fevers, chills, nausea, or vomiting noted at this time. 08/23/18 on evaluation today patient actually appears to be doing great in regard to her bilateral lower should be ulcers. She's shown signs of improvement even just with one week with the [employer]. In general I'm actually very pleased with how things appear at this point. 09/06/18 on evaluation today patient actually appears to be doing much better her left lower extremity is completely healed. Go to the right lower extremity lateral malleolus ulcer this still  seems to be showing signs of being open. There does not appear to be evidence  of infection which is good news. This did require some sharp debridement however to remove some of the necrotic tissue/eschar on the surface of the wound Readmission 11/27/2021 Ms. Goldye Tourangeau is a 88 year old female with a past medical history of endometrial and breast cancer, chronic venous insufficiency, dementia and essential hypertension that presents to the clinic with a 1 month history of nonhealing ulcer to the left lower extremity. She states that the ulcer opened 1 month ago and then healed but has reopened again in the past week. She is currently keeping the area covered. She has a history of wounds to her lower extremities. She has compression stockings that she uses daily. She currently denies signs of infection. 1/13; patient presents for follow-up. She followed up for nurse visit for wrap change. She reports more odor. She denies pain. 1/20; patient presents for follow-up. She has been using gentamicin ointment with calcium alginate to the wound bed. She reports improvement in odor and drainage. She currently denies systemic signs of infection. 1/27; patient presents for follow-up. She has been using gentamicin ointment with calcium alginate. She received her Keystone antibiotic 2 days ago. She brought this in. Home health has also been established. They will come out for the first time next week. She currently denies signs of infection. She has no issues or complaints today. Electronic Signature(s) Signed: 12/18/2021 11:49:58 AM By: Kalman Shan DO Entered By: Kalman Shan on 12/18/2021 10:02:26 -------------------------------------------------------------------------------- Physical Exam Details Patient Name: Date of Service: [name], [name] RNESTINE 12/18/2021 9:15 A M Medical Record Number: [id number] Patient Account Number: [id number] Date of Birth/Sex: Treating RN: July 24, 1925 (88 y.o. Nancy Fetter Primary Care Provider: Marda Stalker Other Clinician: Referring Provider: Treating Provider/Extender: Marjo Bicker Weeks in Treatment: 3 Constitutional respirations regular, non-labored and within target range for patient.. Cardiovascular 2+ dorsalis pedis/posterior tibialis pulses. Psychiatric pleasant and cooperative. Notes Left lower extremity: T the distal lateral aspect there is an open wound with granulation tissue and scant nonviable tissue. No drainage noted. No odor noted. o Improvement in appearance since last clinic visit. 2+ pitting edema to the knee. No signs of surrounding infection. Electronic Signature(s) Signed: 12/18/2021 11:49:58 AM By: Kalman Shan DO Entered By: Kalman Shan on 12/18/2021 10:04:55 -------------------------------------------------------------------------------- Physician Orders Details Patient Name: Date of Service: DARLENA, KOVAL RNESTINE 12/18/2021 9:15 A M Medical Record Number: [id number] Patient Account Number: [id number] Date of Birth/Sex: Treating RN: 21-Nov-1925 (88 y.o. [name] Primary Care Provider: Marda Stalker Other Clinician: Referring Provider: Treating Provider/Extender: Aviva Signs in Treatment: 3 Verbal / Phone Orders: No Diagnosis Coding Follow-up Appointments ppointment in 1 week. - Friday with Dr. Heber [place] and Tammi Klippel, RN Return A ***Continue to bring keystone topical antibiotic medication to each visit.*** Bathing/ Shower/ Hygiene May shower with protection but do not get wound dressing(s) wet. - use cast protector. May shower and wash wound with soap and water. - with dressing changes may wash with soap and water. Edema Control - Lymphedema / SCD / Other Elevate legs to the level of the heart or above for 30 minutes daily and/or when sitting, a frequency of: Avoid standing for long periods of time. Patient to wear own compression stockings every  day. - apply in the morning and remove at night to right leg. Exercise regularly Moisturize legs daily. - every night before day right leg. Additional Orders / Instructions Follow Nutritious Diet - High Protein Diet Home Health New wound care orders this week; continue Home Health for wound  care. May utilize formulary equivalent dressing for wound treatment orders unless otherwise specified. - Home Health for skilled wound care nursing once a week and wound center weekly. Apply keystone topical antibiotics and hydrofera blue classic as primary dressing. Other Home Health Orders/Instructions: [facility] Wound Treatment Wound #26 - Ankle Wound Laterality: Left, Lateral Cleanser: Soap and Water (Home Health) 2 x Per Week/30 Days Discharge Instructions: May shower and wash wound with dial antibacterial soap and water prior to dressing change. Peri-Wound Care: Zinc Oxide Ointment 30g tube (Home Health) 2 x Per Week/30 Days Discharge Instructions: Apply Zinc Oxide to periwound with each dressing change Peri-Wound Care: Sween Lotion (Moisturizing lotion) (Home Health) 2 x Per Week/30 Days Discharge Instructions: Apply moisturizing lotion as directed Topical: Keystone topica Antibiotics (Home Health) 2 x Per Week/30 Days Discharge Instructions: Mix medication according to pharmacy instruction apply to wound bed. Prim Dressing: Hydrofera Blue Classic Foam, 4x4 in (Home Health) 2 x Per Week/30 Days ary Discharge Instructions: Moisten with saline apply over the [facility] medication. Secondary Dressing: Zetuvit Plus 4x8 in [facility]) 2 x Per Week/30 Days Discharge Instructions: Apply over primary dressing as directed. Compression Wrap: ThreePress (3 layer compression wrap) (Home Health) 2 x Per Week/30 Days Discharge Instructions: Apply three layer compression as directed. Electronic Signature(s) Signed: 12/18/2021 11:49:58 AM By: Kalman Shan DO Entered By: Kalman Shan on  12/18/2021 10:05:10 -------------------------------------------------------------------------------- Problem List Details Patient Name: Date of Service: YARIANNA, VARBLE RNESTINE 12/18/2021 9:15 A M Medical Record Number: [id number] Patient Account Number: [id number] Date of Birth/Sex: Treating RN: Aug 19, 1925 (88 y.o. Nancy Fetter Primary Care Provider: Marda Stalker Other Clinician: Referring Provider: Treating Provider/Extender: Aviva Signs in Treatment: 3 Active Problems ICD-10 Encounter Code Description Active Date MDM Diagnosis I87.312 Chronic venous hypertension (idiopathic) with ulcer of left lower extremity 11/27/2021 No Yes L97.822 Non-pressure chronic ulcer of other part of left lower leg with fat layer exposed1/04/2022 No Yes C50.919 Malignant neoplasm of unspecified site of unspecified female breast 11/27/2021 No Yes I50.42 Chronic combined systolic (congestive) and diastolic (congestive) heart failure 11/27/2021 No Yes G30.9 Alzheimer's disease, unspecified 11/27/2021 No Yes C54.1 Malignant neoplasm of endometrium 11/27/2021 No Yes Inactive Problems Resolved Problems Electronic Signature(s) Signed: 12/18/2021 11:49:58 AM By: Kalman Shan DO Entered By: Kalman Shan on 12/18/2021 10:01:20 -------------------------------------------------------------------------------- Progress Note Details Patient Name: Date of Service: [name], ACKROYD RNESTINE 12/18/2021 9:15 A M Medical Record Number: [id number] Patient Account Number: [id number] Date of Birth/Sex: Treating RN: 1925-06-30 (88 y.o. Nancy Fetter Primary Care Provider: Marda Stalker Other Clinician: Referring Provider: Treating Provider/Extender: Aviva Signs in Treatment: 3 Subjective Chief Complaint Information obtained from Patient Left lower extremity wound History of Present Illness (HPI) this is a patient we know from several prior wounds on her  bilateral lower extremities. She has venous stasis physiology, inflammation and hypertension. She is been fully evaluated for venous ablation and was found not to be a candidate. She does not have significant arterial disease. 02/14/15 the wound itself on her lateral left leg did not look too bad however there was surrounding maceration which was concerning 02/27/15 the wound itself is small with surrounding circumferential epithelialization there is no surrounding maceration. 03/06/15 only a very small area remains. I think this is mostly epithelialized however I would be uncomfortable not dressing this this week 03/13/15 the area is epithelialized. There was a thick surface on this. I took a #15 blade then lightly disrupted it but there does not appear  to be any open area I did not see anything but appropriate epithelium Readmission: 08/16/18 on evaluation today patient presents for initial evaluation and our clinic released readmission although she has not been seen since April 2016. Nonetheless she is having issues with the ulcers currently on the bilateral lateral malleolus or locations as well as the left lateral lower extremity. These have been present for roughly 3 weeks since the patient actually developed significant bilateral lower extremity edema which patient's daughter states was roughly 3 times the size of her legs currently. Subsequently she ended up in the emerging department due to congestive heart failure. They were able to get the swelling under control and fortunately she seems to be doing much better at this point in time. She was placed in an [employer] which she sat on for week part evaluation today as well. Fortunately the wound areas actually seem to be doing fairly well there is some macerated skin surrounding there are the areas/surface of the wound which are gonna require some debridement at this point. No fevers, chills, nausea, or vomiting noted at this time. Patient has  a history of hypertension, mild peripheral vascular disease, chronic venous stasis, and other than the ulcer seems to be doing fairly well today. No fevers, chills, nausea, or vomiting noted at this time. 08/23/18 on evaluation today patient actually appears to be doing great in regard to her bilateral lower should be ulcers. She's shown signs of improvement even just with one week with the [employer]. In general I'm actually very pleased with how things appear at this point. 09/06/18 on evaluation today patient actually appears to be doing much better her left lower extremity is completely healed. Go to the right lower extremity lateral malleolus ulcer this still seems to be showing signs of being open. There does not appear to be evidence of infection which is good news. This did require some sharp debridement however to remove some of the necrotic tissue/eschar on the surface of the wound Readmission 11/27/2021 Ms. Agatha Duplechain is a 88 year old female with a past medical history of endometrial and breast cancer, chronic venous insufficiency, dementia and essential hypertension that presents to the clinic with a 1 month history of nonhealing ulcer to the left lower extremity. She states that the ulcer opened 1 month ago and then healed but has reopened again in the past week. She is currently keeping the area covered. She has a history of wounds to her lower extremities. She has compression stockings that she uses daily. She currently denies signs of infection. 1/13; patient presents for follow-up. She followed up for nurse visit for wrap change. She reports more odor. She denies pain. 1/20; patient presents for follow-up. She has been using gentamicin ointment with calcium alginate to the wound bed. She reports improvement in odor and drainage. She currently denies systemic signs of infection. 1/27; patient presents for follow-up. She has been using gentamicin ointment with calcium alginate.  She received her Keystone antibiotic 2 days ago. She brought this in. Home health has also been established. They will come out for the first time next week. She currently denies signs of infection. She has no issues or complaints today. Patient History Information obtained from Patient. Family History Cancer - Child, Hypertension - Child,Father, Kidney Disease - Child, Stroke - Siblings,Child, Thyroid Problems - Child, No family history of Diabetes, Heart Disease, Hereditary Spherocytosis, Lung Disease, Seizures, Tuberculosis. Social History Never smoker, Marital Status - Widowed, Alcohol Use - Never,  Drug Use - No History, Caffeine Use - Never. Medical History Eyes Denies history of Cataracts, Glaucoma, Optic Neuritis Ear/Nose/Mouth/Throat Denies history of Chronic sinus problems/congestion, Middle ear problems Hematologic/Lymphatic Patient has history of Anemia Denies history of Hemophilia, Human Immunodeficiency Virus, Lymphedema, Sickle Cell Disease Respiratory Denies history of Aspiration, Asthma, Chronic Obstructive Pulmonary Disease (COPD), Pneumothorax, Sleep Apnea, Tuberculosis Cardiovascular Patient has history of Congestive Heart Failure, Hypertension, Peripheral Venous Disease Denies history of Angina, Arrhythmia, Coronary Artery Disease, Hypotension, Myocardial Infarction, Peripheral Arterial Disease, Phlebitis, Vasculitis Gastrointestinal Denies history of Cirrhosis , Colitis, Crohnoos, Hepatitis A, Hepatitis B, Hepatitis C Endocrine Denies history of Type I Diabetes, Type II Diabetes Genitourinary Denies history of End Stage Renal Disease Immunological Denies history of Lupus Erythematosus, Raynaudoos, Scleroderma Integumentary (Skin) Denies history of History of Burn Musculoskeletal Patient has history of Osteoarthritis Denies history of Gout, Rheumatoid Arthritis, Osteomyelitis Neurologic Denies history of Neuropathy, Quadriplegia, Paraplegia, Seizure  Disorder Oncologic Patient has history of Received Chemotherapy - Breast and Ovarian Cancer Denies history of Received Radiation Hospitalization/Surgery History - diverticulits. Objective Constitutional respirations regular, non-labored and within target range for patient.. Vitals Time Taken: 9:15 AM, Height: 66 in, Weight: 176 lbs, BMI: 28.4, Temperature: 97.6 F, Pulse: 57 bpm, Respiratory Rate: 16 breaths/min, Blood Pressure: 123/50 mmHg. Cardiovascular 2+ dorsalis pedis/posterior tibialis pulses. Psychiatric pleasant and cooperative. General Notes: Left lower extremity: T the distal lateral aspect there is an open wound with granulation tissue and scant nonviable tissue. No drainage noted. o No odor noted. Improvement in appearance since last clinic visit. 2+ pitting edema to the knee. No signs of surrounding infection. Integumentary (Hair, Skin) Wound #26 status is Open. Original cause of wound was Gradually Appeared. The date acquired was: 11/20/2021. The wound has been in treatment 3 weeks. The wound is located on the Left,Lateral Ankle. The wound measures 4.5cm length x 4.4cm width x 0.1cm depth; 15.551cm^2 area and 1.555cm^3 volume. There is Fat Layer (Subcutaneous Tissue) exposed. There is no tunneling or undermining noted. There is a medium amount of serosanguineous drainage noted. The wound margin is distinct with the outline attached to the wound base. There is large (67-100%) red granulation within the wound bed. There is a small (1-33%) amount of necrotic tissue within the wound bed including Adherent Slough. Assessment Active Problems ICD-10 Chronic venous hypertension (idiopathic) with ulcer of left lower extremity Non-pressure chronic ulcer of other part of left lower leg with fat layer exposed Malignant neoplasm of unspecified site of unspecified female breast Chronic combined systolic (congestive) and diastolic (congestive) heart failure Alzheimer's disease,  unspecified Malignant neoplasm of endometrium Patient's wound has shown improvement in appearance since last clinic visit. No signs of surrounding infection on exam. At this time I recommended starting [facility] antibiotic with Hydrofera Blue under 3 layer compression. Patient has home health and they will change the wrap early next week. I will see her next week as well. She knows to call with any questions or concerns. Procedures Wound #26 Pre-procedure diagnosis of Wound #26 is a Venous Leg Ulcer located on the Left,Lateral Ankle . There was a Three Layer Compression Therapy Procedure by Deon Pilling, RN. Post procedure Diagnosis Wound #26: Same as Pre-Procedure Plan Follow-up Appointments: Return Appointment in 1 week. - Friday with Dr. Heber [place] and Tammi Klippel, RN ***Continue to bring keystone topical antibiotic medication to each visit.*** Bathing/ Shower/ Hygiene: May shower with protection but do not get wound dressing(s) wet. - use cast protector. May shower and wash wound with soap and water. -  with dressing changes may wash with soap and water. Edema Control - Lymphedema / SCD / Other: Elevate legs to the level of the heart or above for 30 minutes daily and/or when sitting, a frequency of: Avoid standing for long periods of time. Patient to wear own compression stockings every day. - apply in the morning and remove at night to right leg. Exercise regularly Moisturize legs daily. - every night before day right leg. Additional Orders / Instructions: Follow Nutritious Diet - High Protein Diet Home Health: New wound care orders this week; continue Home Health for wound care. May utilize formulary equivalent dressing for wound treatment orders unless otherwise specified. - Home Health for skilled wound care nursing once a week and wound center weekly. Apply keystone topical antibiotics and hydrofera blue classic as primary dressing. Other Home Health Orders/Instructions: [facility] home  health WOUND #26: - Ankle Wound Laterality: Left, Lateral Cleanser: Soap and Water (Home Health) 2 x Per Week/30 Days Discharge Instructions: May shower and wash wound with dial antibacterial soap and water prior to dressing change. Peri-Wound Care: Zinc Oxide Ointment 30g tube (Home Health) 2 x Per Week/30 Days Discharge Instructions: Apply Zinc Oxide to periwound with each dressing change Peri-Wound Care: Sween Lotion (Moisturizing lotion) (Home Health) 2 x Per Week/30 Days Discharge Instructions: Apply moisturizing lotion as directed Topical: Keystone topica Antibiotics (Home Health) 2 x Per Week/30 Days Discharge Instructions: Mix medication according to pharmacy instruction apply to wound bed. Prim Dressing: Hydrofera Blue Classic Foam, 4x4 in (Home Health) 2 x Per Week/30 Days ary Discharge Instructions: Moisten with saline apply over the [facility] medication. Secondary Dressing: Zetuvit Plus 4x8 in [facility]) 2 x Per Week/30 Days Discharge Instructions: Apply over primary dressing as directed. Com pression Wrap: ThreePress (3 layer compression wrap) (Home Health) 2 x Per Week/30 Days Discharge Instructions: Apply three layer compression as directed. 1. Keystone antibiotics with Hydrofera Blue under 3 layer compression 2. Follow-up in 1 week Electronic Signature(s) Signed: 12/18/2021 11:49:58 AM By: Kalman Shan DO Entered By: Kalman Shan on 12/18/2021 11:48:36 -------------------------------------------------------------------------------- HxROS Details Patient Name: Date of Service: [name], TURVEY RNESTINE 12/18/2021 9:15 A M Medical Record Number: [id number] Patient Account Number: [id number] Date of Birth/Sex: Treating RN: 11-30-1924 (88 y.o. Nancy Fetter Primary Care Provider: Marda Stalker Other Clinician: Referring Provider: Treating Provider/Extender: Aviva Signs in Treatment: 3 Information Obtained  From Patient Eyes Medical History: Negative for: Cataracts; Glaucoma; Optic Neuritis Ear/Nose/Mouth/Throat Medical History: Negative for: Chronic sinus problems/congestion; Middle ear problems Hematologic/Lymphatic Medical History: Positive for: Anemia Negative for: Hemophilia; Human Immunodeficiency Virus; Lymphedema; Sickle Cell Disease Respiratory Medical History: Negative for: Aspiration; Asthma; Chronic Obstructive Pulmonary Disease (COPD); Pneumothorax; Sleep Apnea; Tuberculosis Cardiovascular Medical History: Positive for: Congestive Heart Failure; Hypertension; Peripheral Venous Disease Negative for: Angina; Arrhythmia; Coronary Artery Disease; Hypotension; Myocardial Infarction; Peripheral Arterial Disease; Phlebitis; Vasculitis Gastrointestinal Medical History: Negative for: Cirrhosis ; Colitis; Crohns; Hepatitis A; Hepatitis B; Hepatitis C Endocrine Medical History: Negative for: Type I Diabetes; Type II Diabetes Genitourinary Medical History: Negative for: End Stage Renal Disease Immunological Medical History: Negative for: Lupus Erythematosus; Raynauds; Scleroderma Integumentary (Skin) Medical History: Negative for: History of Burn Musculoskeletal Medical History: Positive for: Osteoarthritis Negative for: Gout; Rheumatoid Arthritis; Osteomyelitis Neurologic Medical History: Negative for: Neuropathy; Quadriplegia; Paraplegia; Seizure Disorder Oncologic Medical History: Positive for: Received Chemotherapy - Breast and Ovarian Cancer Negative for: Received Radiation Immunizations Pneumococcal Vaccine: Received Pneumococcal Vaccination: Yes Received Pneumococcal Vaccination On or After 60th Birthday: Yes Implantable Devices  None Hospitalization / Surgery History Type of Hospitalization/Surgery diverticulits Family and Social History Cancer: Yes - Child; Diabetes: No; Heart Disease: No; Hereditary Spherocytosis: No; Hypertension: Yes - Child,Father;  Kidney Disease: Yes - Child; Lung Disease: No; Seizures: No; Stroke: Yes - Siblings,Child; Thyroid Problems: Yes - Child; Tuberculosis: No; Never smoker; Marital Status - Widowed; Alcohol Use: Never; Drug Use: No History; Caffeine Use: Never; Financial Concerns: No; Food, Clothing or Shelter Needs: No; Support System Lacking: No; Transportation Concerns: No Electronic Signature(s) Signed: 12/18/2021 11:49:58 AM By: Kalman Shan DO Signed: 12/22/2021 5:37:15 PM By: Levan Hurst RN, BSN Entered By: Kalman Shan on 12/18/2021 10:02:32 -------------------------------------------------------------------------------- SuperBill Details Patient Name: Date of Service: TAIJA, MATHIAS RNESTINE 12/18/2021 Medical Record Number: [id number] Patient Account Number: [id number] Date of Birth/Sex: Treating RN: [birth year] (88 y.o. [name], Meta.Reding Primary Care Provider: Marda Stalker Other Clinician: Referring Provider: Treating Provider/Extender: Marjo Bicker Weeks in Treatment: 3 Diagnosis Coding ICD-10 Codes Code Description [phone] Chronic venous hypertension (idiopathic) with ulcer of left lower extremity L97.822 Non-pressure chronic ulcer of other part of left lower leg with fat layer exposed C50.919 Malignant neoplasm of unspecified site of unspecified female breast I50.42 Chronic combined systolic (congestive) and diastolic (congestive) heart failure G30.9 Alzheimer's disease, unspecified C54.1 Malignant neoplasm of endometrium Facility Procedures CPT4 Code: [id number] Description: (Facility Use Only) [phone] - [place] LWR LT LEG Modifier: Quantity: 1 Physician Procedures : CPT4 Code Description Modifier [id number] - WC PHYS LEVEL 3 - EST PT ICD-10 Diagnosis Description I87.312 Chronic venous hypertension (idiopathic) with ulcer of left lower extremity L97.822 Non-pressure chronic ulcer of other part of left lower leg  with fat layer exposed I50.42  Chronic combined systolic (congestive) and diastolic (congestive) heart failure Quantity: 1 Electronic Signature(s) Signed: 12/18/2021 11:49:58 AM By: Kalman Shan DO Entered By: Kalman Shan on 12/18/2021 11:49:12

## 2021-12-23 NOTE — Progress Notes (Signed)
[name], [name] ([id number]) Visit Report for 12/18/2021 Arrival Information Details Patient Name: Date of Service: [name], [name] 12/18/2021 9:15 A M Medical Record Number: [id number] Patient Account Number: [id number] Date of Birth/Sex: Treating RN: July 29, 1925 (88 y.o. [name] Primary Care [name]: [name] Other Clinician: Referring [name]: Treating [name]/Extender: [name] Signs in Treatment: 3 Visit Information History Since Last Visit Added or deleted any medications: No Patient Arrived: Wheel Chair Any new allergies or adverse reactions: No Arrival Time: 09:15 Had a fall or experienced change in No Accompanied By: daughter activities of daily living that may affect Transfer Assistance: None risk of falls: Patient Identification Verified: Yes Signs or symptoms of abuse/neglect since last visito No Secondary Verification Process Completed: Yes Hospitalized since last visit: No Patient Requires Transmission-Based Precautions: No Implantable device outside of the clinic excluding No Patient Has Alerts: No cellular tissue based products placed in the center since last visit: Has Dressing in Place as Prescribed: Yes Pain Present Now: No Electronic Signature(s) Signed: 12/23/2021 9:58:51 AM By: [name] Entered By: [name] on 12/18/2021 09:15:53 -------------------------------------------------------------------------------- Compression Therapy Details Patient Name: Date of Service: [name] [name] 12/18/2021 9:15 A M Medical Record Number: [id number] Patient Account Number: [id number] Date of Birth/Sex: Treating RN: 1925-07-04 (88 y.o. [name] Primary Care [name]: [name] Other Clinician: Referring [name]: Treating [name]/Extender: [name] Weeks in Treatment: 3 Compression Therapy Performed for Wound Assessment: Wound #26 Left,Lateral  Ankle Performed By: Clinician [name] Pilling, RN Compression Type: Three Layer Post Procedure Diagnosis Same as Pre-procedure Electronic Signature(s) Signed: 12/18/2021 1:05:50 PM By: [name] Entered By: [name] on 12/18/2021 09:40:08 -------------------------------------------------------------------------------- Encounter Discharge Information Details Patient Name: Date of Service: [name], [name] 12/18/2021 9:15 A M Medical Record Number: [id number] Patient Account Number: [id number] Date of Birth/Sex: Treating RN: 1925-09-12 (88 y.o. [name], [name] Primary Care Caasi Giglia: [name] Other Clinician: Referring [name]: Treating [name]/Extender: [name] Signs in Treatment: 3 Encounter Discharge Information Items Discharge Condition: Stable Ambulatory Status: Wheelchair Discharge Destination: Home Transportation: Private Auto Accompanied By: daughter Schedule Follow-up Appointment: Yes Clinical Summary of Care: Electronic Signature(s) Signed: 12/18/2021 1:05:50 PM By: [name] Entered By: [name] on 12/18/2021 09:48:39 -------------------------------------------------------------------------------- Lower Extremity Assessment Details Patient Name: Date of Service: [name], [name] 12/18/2021 9:15 A M Medical Record Number: [id number] Patient Account Number: [id number] Date of Birth/Sex: Treating RN: 1925-05-17 (88 y.o. [name] Primary Care Colvin Blatt: [name] Other Clinician: Referring Willaim Mode: Treating [name]/Extender: [name] Weeks in Treatment: 3 Edema Assessment Assessed: [Left: Yes] [Right: No] Edema: [Left: Ye] [Right: s] Calf Left: Right: Point of Measurement: 30 cm From Medial Instep 35 cm Ankle Left: Right: Point of Measurement: 11 cm From Medial Instep 22 cm Vascular Assessment Pulses: Dorsalis Pedis Palpable:  [Left:Yes] Electronic Signature(s) Signed: 12/18/2021 1:05:50 PM By: [name] Entered By: [name] on 12/18/2021 09:37:33 -------------------------------------------------------------------------------- Multi Wound Chart Details Patient Name: Date of Service: [name], [name] 12/18/2021 9:15 A M Medical Record Number: [id number] Patient Account Number: [id number] Date of Birth/Sex: Treating RN: [birth year] (88 y.o. [name] Primary Care Oneita Allmon: [name] Other Clinician: Referring Adelei Scobey: Treating Markitta Ausburn/Extender: [name] Weeks in Treatment: 3 Vital Signs Height(in): 66 Pulse(bpm): 65 Weight(lbs): 176 Blood Pressure(mmHg): 123/50 Body Mass Index(BMI): 28.4 Temperature(F): 97.6 Respiratory Rate(breaths/min): 16 Photos: [N/A:N/A] Left, Lateral Ankle N/A N/A Wound Location: Gradually Appeared N/A N/A Wounding Event: Venous Leg Ulcer N/A  N/A Primary Etiology: Anemia, Congestive Heart Failure, N/A N/A Comorbid History: Hypertension, Peripheral Venous Disease, Osteoarthritis, Received Chemotherapy 11/20/2021 N/A N/A Date Acquired: 3 N/A N/A Weeks of Treatment: Open N/A N/A Wound Status: No N/A N/A Wound Recurrence: 4.5x4.4x0.1 N/A N/A Measurements L x W x D (cm) 15.551 N/A N/A A (cm) : rea 1.555 N/A N/A Volume (cm) : -65.00% N/A N/A % Reduction in Area: -65.10% N/A N/A % Reduction in Volume: Full Thickness Without Exposed N/A N/A Classification: Support Structures Medium N/A N/A Exudate Amount: Serosanguineous N/A N/A Exudate Type: red, brown N/A N/A Exudate Color: Distinct, outline attached N/A N/A Wound Margin: Large (67-100%) N/A N/A Granulation Amount: Red N/A N/A Granulation Quality: Small (1-33%) N/A N/A Necrotic Amount: Fat Layer (Subcutaneous Tissue): Yes N/A N/A Exposed Structures: Fascia: No Tendon: No Muscle: No Joint: No Bone: No Small (1-33%) N/A  N/A Epithelialization: Compression Therapy N/A N/A Procedures Performed: Treatment Notes Wound #26 (Ankle) Wound Laterality: Left, Lateral Cleanser Soap and Water Discharge Instruction: May shower and wash wound with dial antibacterial soap and water prior to dressing change. Peri-Wound Care Zinc Oxide Ointment 30g tube Discharge Instruction: Apply Zinc Oxide to periwound with each dressing change Sween Lotion (Moisturizing lotion) Discharge Instruction: Apply moisturizing lotion as directed Topical Keystone topica Antibiotics Discharge Instruction: Mix medication according to pharmacy instruction apply to wound bed. Primary Dressing Hydrofera Blue Classic Foam, 4x4 in Discharge Instruction: Moisten with saline apply over the [facility] medication. Secondary Dressing Zetuvit Plus 4x8 in Discharge Instruction: Apply over primary dressing as directed. Secured With Compression Wrap ThreePress (3 layer compression wrap) Discharge Instruction: Apply three layer compression as directed. Compression Stockings Add-Ons Electronic Signature(s) Signed: 12/18/2021 11:49:58 AM By: Kalman Shan DO Signed: 12/22/2021 5:37:15 PM By: Levan Hurst RN, [name] Entered By: Kalman Shan on 12/18/2021 10:01:25 -------------------------------------------------------------------------------- [place] Details Patient Name: Date of Service: [name], [name] 12/18/2021 9:15 A M Medical Record Number: [id number] Patient Account Number: [id number] Date of Birth/Sex: Treating RN: 11-Sep-1925 (88 y.o. [name], [name] Primary Care Gretta Samons: [name] Other Clinician: Referring Keron Koffman: Treating Trashawn Oquendo/Extender: [name] Signs in Treatment: 3 Active Inactive Nutrition Nursing Diagnoses: Imbalanced nutrition Goals: Patient/caregiver agrees to and verbalizes understanding of need to use nutritional supplements and/or vitamins as  prescribed Date Initiated: 11/27/2021 Target Resolution Date: 01/22/2022 Goal Status: Active Interventions: Assess patient nutrition upon admission and as needed per policy Provide education on nutrition Treatment Activities: Education provided on Nutrition : 11/27/2021 Notes: Wound/Skin Impairment Nursing Diagnoses: Impaired tissue integrity Goals: Patient/caregiver will verbalize understanding of skin care regimen Date Initiated: 11/27/2021 Target Resolution Date: 01/22/2022 Goal Status: Active Ulcer/skin breakdown will have a volume reduction of 30% by week 4 Date Initiated: 11/27/2021 Target Resolution Date: 12/25/2021 Goal Status: Active Interventions: Assess patient/caregiver ability to obtain necessary supplies Assess patient/caregiver ability to perform ulcer/skin care regimen upon admission and as needed Assess ulceration(s) every visit Provide education on ulcer and skin care Treatment Activities: Topical wound management initiated : 11/27/2021 Notes: Electronic Signature(s) Signed: 12/18/2021 1:05:50 PM By: [name] Entered By: [name] on 12/18/2021 09:39:29 -------------------------------------------------------------------------------- Pain Assessment Details Patient Name: Date of Service: [name], [name] 12/18/2021 9:15 A M Medical Record Number: [id number] Patient Account Number: [id number] Date of Birth/Sex: Treating RN: 1925/01/21 (88 y.o. [name] Primary Care Windle Huebert: [name] Other Clinician: Referring Maressa Apollo: Treating Sierra Spargo/Extender: [name] Weeks in Treatment: 3 Active Problems Location of Pain Severity and Description of Pain Patient Has Paino No Site Locations  Pain Management and Medication Current Pain Management: Electronic Signature(s) Signed: 12/22/2021 5:37:15 PM By: Levan Hurst RN, [name] Signed: 12/23/2021 9:58:51 AM By: [name] Entered By: [name] on 12/18/2021  09:16:15 -------------------------------------------------------------------------------- Patient/Caregiver Education Details Patient Name: Date of Service: Thalia Party [name] 1/27/2023andnbsp9:15 [place] Record Number: [id number] Patient Account Number: [id number] Date of Birth/Gender: Treating RN: 23-Nov-1924 (88 y.o. [name] Primary Care Physician: [name] Other Clinician: Referring Physician: Treating Physician/Extender: [name] Signs in Treatment: 3 Education Assessment Education Provided To: Patient and Caregiver Education Topics Provided Wound/Skin Impairment: Handouts: Skin Care Do's and Dont's Methods: Explain/Verbal Responses: Reinforcements needed Electronic Signature(s) Signed: 12/18/2021 1:05:50 PM By: [name] Entered By: [name] on 12/18/2021 09:39:46 -------------------------------------------------------------------------------- Wound Assessment Details Patient Name: Date of Service: [name], [name] 12/18/2021 9:15 A M Medical Record Number: [id number] Patient Account Number: [id number] Date of Birth/Sex: Treating RN: 05/13/1925 (88 y.o. [name] Primary Care Perri Aragones: [name] Other Clinician: Referring Glennie Bose: Treating Burnadette Baskett/Extender: [name] Weeks in Treatment: 3 Wound Status Wound Number: 26 Primary Venous Leg Ulcer Etiology: Wound Location: Left, Lateral Ankle Wound Open Wounding Event: Gradually Appeared Status: Date Acquired: 11/20/2021 Comorbid Anemia, Congestive Heart Failure, Hypertension, Peripheral Weeks Of Treatment: 3 History: Venous Disease, Osteoarthritis, Received Chemotherapy Clustered Wound: No Photos Wound Measurements Length: (cm) 4.5 Width: (cm) 4.4 Depth: (cm) 0.1 Area: (cm) 15.551 Volume: (cm) 1.555 % Reduction in Area: -65% % Reduction in Volume: -65.1% Epithelialization: Small (1-33%) Tunneling:  No Undermining: No Wound Description Classification: Full Thickness Without Exposed Support Structures Wound Margin: Distinct, outline attached Exudate Amount: Medium Exudate Type: Serosanguineous Exudate Color: red, brown Foul Odor After Cleansing: No Slough/Fibrino Yes Wound Bed Granulation Amount: Large (67-100%) Exposed Structure Granulation Quality: Red Fascia Exposed: No Necrotic Amount: Small (1-33%) Fat Layer (Subcutaneous Tissue) Exposed: Yes Necrotic Quality: Adherent Slough Tendon Exposed: No Muscle Exposed: No Joint Exposed: No Bone Exposed: No Treatment Notes Wound #26 (Ankle) Wound Laterality: Left, Lateral Cleanser Soap and Water Discharge Instruction: May shower and wash wound with dial antibacterial soap and water prior to dressing change. Peri-Wound Care Zinc Oxide Ointment 30g tube Discharge Instruction: Apply Zinc Oxide to periwound with each dressing change Sween Lotion (Moisturizing lotion) Discharge Instruction: Apply moisturizing lotion as directed Topical Keystone topica Antibiotics Discharge Instruction: Mix medication according to pharmacy instruction apply to wound bed. Primary Dressing Hydrofera Blue Classic Foam, 4x4 in Discharge Instruction: Moisten with saline apply over the [facility] medication. Secondary Dressing Zetuvit Plus 4x8 in Discharge Instruction: Apply over primary dressing as directed. Secured With Compression Wrap ThreePress (3 layer compression wrap) Discharge Instruction: Apply three layer compression as directed. Compression Stockings Add-Ons Electronic Signature(s) Signed: 12/18/2021 1:05:50 PM By: [name] Signed: 12/22/2021 5:37:15 PM By: Levan Hurst RN, [name] Entered By: [name] on 12/18/2021 09:38:04 -------------------------------------------------------------------------------- Vitals Details Patient Name: Date of Service: [name], [name] 12/18/2021 9:15 A M Medical Record Number:  [id number] Patient Account Number: [id number] Date of Birth/Sex: Treating RN: [birth year] (88 y.o. [name] Primary Care Crimson Dubberly: [name] Other Clinician: Referring Keniel Ralston: Treating Raizy Auzenne/Extender: [name] Weeks in Treatment: 3 Vital Signs Time Taken: 09:15 Temperature (F): 97.6 Height (in): 66 Pulse (bpm): 57 Weight (lbs): 176 Respiratory Rate (breaths/min): 16 Body Mass Index (BMI): 28.4 Blood Pressure (mmHg): 123/50 Reference Range: 80 - 120 mg / dl Electronic Signature(s) Signed: 12/23/2021 9:58:51 AM By: [name] Entered By: [name] on 12/18/2021 09:16:11

## 2021-12-24 ENCOUNTER — Encounter (HOSPITAL_BASED_OUTPATIENT_CLINIC_OR_DEPARTMENT_OTHER): Payer: Medicare PPO | Attending: Internal Medicine | Admitting: Internal Medicine

## 2021-12-24 ENCOUNTER — Other Ambulatory Visit: Payer: Self-pay

## 2021-12-24 DIAGNOSIS — G309 Alzheimer's disease, unspecified: Secondary | ICD-10-CM | POA: Diagnosis not present

## 2021-12-24 DIAGNOSIS — L97822 Non-pressure chronic ulcer of other part of left lower leg with fat layer exposed: Secondary | ICD-10-CM

## 2021-12-24 DIAGNOSIS — I739 Peripheral vascular disease, unspecified: Secondary | ICD-10-CM | POA: Diagnosis not present

## 2021-12-24 DIAGNOSIS — I872 Venous insufficiency (chronic) (peripheral): Secondary | ICD-10-CM | POA: Diagnosis not present

## 2021-12-24 DIAGNOSIS — I5042 Chronic combined systolic (congestive) and diastolic (congestive) heart failure: Secondary | ICD-10-CM | POA: Diagnosis not present

## 2021-12-24 DIAGNOSIS — I87312 Chronic venous hypertension (idiopathic) with ulcer of left lower extremity: Secondary | ICD-10-CM

## 2021-12-24 DIAGNOSIS — M1990 Unspecified osteoarthritis, unspecified site: Secondary | ICD-10-CM | POA: Insufficient documentation

## 2021-12-24 DIAGNOSIS — I110 Hypertensive heart disease with heart failure: Secondary | ICD-10-CM | POA: Diagnosis not present

## 2021-12-25 ENCOUNTER — Ambulatory Visit: Payer: Medicare PPO | Admitting: Podiatry

## 2021-12-25 ENCOUNTER — Encounter: Payer: Self-pay | Admitting: Podiatry

## 2021-12-25 DIAGNOSIS — I739 Peripheral vascular disease, unspecified: Secondary | ICD-10-CM | POA: Diagnosis not present

## 2021-12-25 DIAGNOSIS — L84 Corns and callosities: Secondary | ICD-10-CM

## 2021-12-25 DIAGNOSIS — R296 Repeated falls: Secondary | ICD-10-CM | POA: Insufficient documentation

## 2021-12-25 DIAGNOSIS — M79674 Pain in right toe(s): Secondary | ICD-10-CM

## 2021-12-25 DIAGNOSIS — L89891 Pressure ulcer of other site, stage 1: Secondary | ICD-10-CM

## 2021-12-25 DIAGNOSIS — B351 Tinea unguium: Secondary | ICD-10-CM | POA: Diagnosis not present

## 2021-12-25 NOTE — Patient Instructions (Addendum)
Biatain Silicone Pad or equivalent for pressure area of left bunion for protection daily. Continue post-op shoe until resolved.  Foam toe separators for 1st webspace between great toe and second toe of both feet. May be purchased from [employer].  Pressure Injury A pressure injury is damage to the skin and underlying tissue that results from pressure being applied to an area of the body. It often affects people who must spend a long time in a bed or chair because of a medical condition. Pressure injuries usually occur: Over bony parts of the body, such as the tailbone, shoulders, elbows, hips, heels, spine, ankles, and back of the head. Under medical devices that make contact with the body, such as respiratory equipment, stockings, tubes, and splints. Pressure injuries start as reddened areas on the skin and can lead to pain and an open wound. What are the causes? This condition is caused by frequent or constant pressure to an area of the body. Decreased blood flow to the skin can eventually cause the skin tissue to die and break down, causing a wound. What increases the risk? You are more likely to develop this condition if you: Are in the hospital or an extended care facility. Are bedridden or in a wheelchair. Have an injury or disease that keeps you from: Moving normally. Feeling pain or pressure. Have a condition that: Makes you sleepy or less alert. Causes poor blood flow. Need to wear a medical device. Have poor control of your bladder or bowel functions (incontinence). Have poor nutrition (malnutrition). If you are at risk for pressure injuries, your health care provider may recommend certain types of mattresses, mattress covers, pillows, cushions, or boots to help prevent them. These may include products filled with air, foam, gel, or sand. What are the signs or symptoms? Symptoms of this condition depend on the severity of the injury. Symptoms may include: Red or dark areas of the  skin. Pain, warmth, or a change of skin texture. Blisters. An open wound. How is this diagnosed? This condition is diagnosed with a medical history and physical exam. You may also have tests, such as: Blood tests. Imaging tests. Blood flow tests. Your pressure injury will be staged based on its severity. Staging is based on: The depth of the tissue injury, including whether there is exposure of muscle, bone, or tendon. The cause of the pressure injury. How is this treated? This condition may be treated by: Relieving or redistributing pressure on your skin. This includes: Frequently changing your position. Avoiding positions that caused the wound or that can make the wound worse. Using specific bed mattresses, chair cushions, or protective boots. Moving medical devices from an area of pressure, or placing padding between the skin and the device. Using foams, creams, or powders to prevent rubbing (friction) on the skin. Keeping your skin clean and dry. This may include using a skin cleanser or skin barrier as told by your health care provider. Cleaning your injury and removing any dead tissue from the wound (debridement). Placing a bandage (dressing) over your injury. Using medicines for pain or to prevent or treat infection. Surgery may be needed if other treatments are not working or if your injury is very deep. Follow these instructions at home: Wound care Follow instructions from your health care provider about how to take care of your wound. Make sure you: Wash your hands with soap and water before and after you change your bandage (dressing). If soap and water are not available, use hand sanitizer.  Change your dressing as told by your health care provider.  Check your wound every day for signs of infection. Have a caregiver do this for you if you are not able. Check for: Redness, swelling, or increased pain. More fluid or blood. Warmth. Pus or a bad smell. Skin care Keep your  skin clean and dry. Gently pat your skin dry. Do not rub or massage your skin. You or a caregiver should check your skin every day for any changes in color or any new blisters or sores (ulcers). Medicines Take over-the-counter and prescription medicines only as told by your health care provider. If you were prescribed an antibiotic medicine, take or apply it as told by your health care provider. Do not stop using the antibiotic even if your condition improves. Reducing and redistributing pressure Do not lie or sit in one position for a long time. Move or change position every 1-2 hours, or as told by your health care provider. Use pillows or cushions to reduce pressure. Ask your health care provider to recommend cushions or pads for you. General instructions  Eat a healthy diet that includes lots of protein. Drink enough fluid to keep your urine pale yellow. Be as active as you can every day. Ask your health care provider to suggest safe exercises or activities. Do not abuse drugs or alcohol. Do not use any products that contain nicotine or tobacco, such as cigarettes, e-cigarettes, and chewing tobacco. If you need help quitting, ask your health care provider. Keep all follow-up visits as told by your health care provider. This is important. Contact a health care provider if: You have: A fever or chills. Pain that is not helped by medicine. Any changes in skin color. New blisters or sores. Pus or a bad smell coming from your wound. Redness, swelling, or pain around your wound. More fluid or blood coming from your wound. Your wound does not improve after 1-2 weeks of treatment. Summary A pressure injury is damage to the skin and underlying tissue that results from pressure being applied to an area of the body. Do not lie or sit in one position for a long time. Your health care provider may advise you to move or change position every 1-2 hours. Follow instructions from your health care  provider about how to take care of your wound. Keep all follow-up visits as told by your health care provider. This is important. This information is not intended to replace advice given to you by your health care provider. Make sure you discuss any questions you have with your health care provider. Document Revised: 06/07/2018 Document Reviewed: 06/07/2018 Elsevier Patient Education  [place].

## 2021-12-25 NOTE — Progress Notes (Signed)
YSABELLE, GOODROE ([id number]) Visit Report for 12/24/2021 Arrival Information Details Patient Name: Date of Service: [name], [name] 12/24/2021 10:30 A M Medical Record Number: [id number] Patient Account Number: [id number] Date of Birth/Sex: Treating RN: 11-18-1925 (88 y.o. [name], Meta.Reding Primary Care Elane Peabody: Marda Stalker Other Clinician: Referring Rajiv Parlato: Treating Lachlyn Vanderstelt/Extender: Aviva Signs in Treatment: 3 Visit Information History Since Last Visit Added or deleted any medications: No Patient Arrived: Wheel Chair Any new allergies or adverse reactions: No Arrival Time: 10:20 Had a fall or experienced change in No Accompanied By: daughter activities of daily living that may affect Transfer Assistance: None risk of falls: Patient Identification Verified: Yes Signs or symptoms of abuse/neglect since last visito No Secondary Verification Process Completed: Yes Hospitalized since last visit: No Patient Requires Transmission-Based Precautions: No Implantable device outside of the clinic excluding No Patient Has Alerts: No cellular tissue based products placed in the center since last visit: Has Dressing in Place as Prescribed: Yes Has Compression in Place as Prescribed: Yes Pain Present Now: No Electronic Signature(s) Signed: 12/24/2021 4:09:06 PM By: Deon Pilling RN, BSN Entered By: Deon Pilling on 12/24/2021 10:24:49 -------------------------------------------------------------------------------- Compression Therapy Details Patient Name: Date of Service: [name], ZORN [name] 12/24/2021 10:30 A M Medical Record Number: [id number] Patient Account Number: [id number] Date of Birth/Sex: Treating RN: 12-14-1924 (88 y.o. [name] Primary Care Kaidyn Javid: Marda Stalker Other Clinician: Referring Veola Cafaro: Treating Arayla Kruschke/Extender: Marjo Bicker Weeks in Treatment: 3 Compression Therapy Performed for Wound  Assessment: Wound #26 Left,Lateral Ankle Performed By: Clinician Deon Pilling, RN Compression Type: Three Layer Post Procedure Diagnosis Same as Pre-procedure Electronic Signature(s) Signed: 12/24/2021 4:09:06 PM By: Deon Pilling RN, BSN Entered By: Deon Pilling on 12/24/2021 10:38:40 -------------------------------------------------------------------------------- Encounter Discharge Information Details Patient Name: Date of Service: [name], BARANSKI [name] 12/24/2021 10:30 A M Medical Record Number: [id number] Patient Account Number: [id number] Date of Birth/Sex: Treating RN: Nov 03, 1925 (88 y.o. [name], Tammi Klippel Primary Care Yan Okray: Marda Stalker Other Clinician: Referring Laverta Harnisch: Treating Sophiya Morello/Extender: Aviva Signs in Treatment: 3 Encounter Discharge Information Items Discharge Condition: Stable Ambulatory Status: Wheelchair Discharge Destination: Home Transportation: Other Accompanied By: daughter Schedule Follow-up Appointment: Yes Clinical Summary of Care: Electronic Signature(s) Signed: 12/24/2021 4:09:06 PM By: Deon Pilling RN, BSN Entered By: Deon Pilling on 12/24/2021 10:42:37 -------------------------------------------------------------------------------- Lower Extremity Assessment Details Patient Name: Date of Service: [name], WERDEN [name] 12/24/2021 10:30 A M Medical Record Number: [id number] Patient Account Number: [id number] Date of Birth/Sex: Treating RN: 16-May-1925 (88 y.o. [name], Tammi Klippel Primary Care Avri Paiva: Marda Stalker Other Clinician: Referring Saamiya Jeppsen: Treating Glendale Youngblood/Extender: Marjo Bicker Weeks in Treatment: 3 Edema Assessment Assessed: [Left: Yes] [Right: No] Edema: [Left: N] [Right: o] Calf Left: Right: Point of Measurement: 30 cm From Medial Instep 29 cm Ankle Left: Right: Point of Measurement: 11 cm From Medial Instep 22 cm Vascular Assessment Pulses: Dorsalis  Pedis Palpable: [Left:Yes] Electronic Signature(s) Signed: 12/24/2021 4:09:06 PM By: Deon Pilling RN, BSN Entered By: Deon Pilling on 12/24/2021 10:29:03 -------------------------------------------------------------------------------- Multi Wound Chart Details Patient Name: Date of Service: [name], VALIENTE [name] 12/24/2021 10:30 A M Medical Record Number: [id number] Patient Account Number: [id number] Date of Birth/Sex: Treating RN: 02-28-1925 (88 y.o. [name] Primary Care Chaz Mcglasson: Marda Stalker Other Clinician: Referring Kenza Munar: Treating Bhavana Kady/Extender: Marjo Bicker Weeks in Treatment: 3 Vital Signs Height(in): 66 Pulse(bpm): 64 Weight(lbs): 176 Blood Pressure(mmHg): 134/72 Body Mass Index(BMI): 28.4 Temperature(F): 97.7 Respiratory Rate(breaths/min): 20 Photos: [N/A:N/A] Left, Lateral Ankle N/A N/A Wound Location: Gradually Appeared  N/A N/A Wounding Event: Venous Leg Ulcer N/A N/A Primary Etiology: Anemia, Congestive Heart Failure, N/A N/A Comorbid History: Hypertension, Peripheral Venous Disease, Osteoarthritis, Received Chemotherapy 11/20/2021 N/A N/A Date Acquired: 3 N/A N/A Weeks of Treatment: Open N/A N/A Wound Status: No N/A N/A Wound Recurrence: 4.5x4.4x0.1 N/A N/A Measurements L x W x D (cm) 15.551 N/A N/A A (cm) : rea 1.555 N/A N/A Volume (cm) : -65.00% N/A N/A % Reduction in Area: -65.10% N/A N/A % Reduction in Volume: Full Thickness Without Exposed N/A N/A Classification: Support Structures Medium N/A N/A Exudate Amount: Serosanguineous N/A N/A Exudate Type: red, brown N/A N/A Exudate Color: Distinct, outline attached N/A N/A Wound Margin: Large (67-100%) N/A N/A Granulation Amount: Red N/A N/A Granulation Quality: Small (1-33%) N/A N/A Necrotic Amount: Fat Layer (Subcutaneous Tissue): Yes N/A N/A Exposed Structures: Fascia: No Tendon: No Muscle: No Joint: No Bone: No Small (1-33%)  N/A N/A Epithelialization: Compression Therapy N/A N/A Procedures Performed: Treatment Notes Wound #26 (Ankle) Wound Laterality: Left, Lateral Cleanser Soap and Water Discharge Instruction: May shower and wash wound with dial antibacterial soap and water prior to dressing change. Peri-Wound Care Zinc Oxide Ointment 30g tube Discharge Instruction: Apply Zinc Oxide as needed to periwound with each dressing change Sween Lotion (Moisturizing lotion) Discharge Instruction: Apply moisturizing lotion as directed Topical Keystone topica Antibiotics Discharge Instruction: Mix medication according to pharmacy instruction apply to wound bed. Primary Dressing Hydrofera Blue Classic Foam, 4x4 in Discharge Instruction: Moisten with saline apply over the [facility] medication. Secondary Dressing Zetuvit Plus 4x8 in Discharge Instruction: Apply over primary dressing as directed. Secured With Compression Wrap ThreePress (3 layer compression wrap) Discharge Instruction: Apply three layer compression as directed. Compression Stockings Add-Ons Electronic Signature(s) Signed: 12/24/2021 12:11:13 PM By: Kalman Shan DO Signed: 12/25/2021 1:32:11 PM By: Fara Chute By: Kalman Shan on 12/24/2021 10:42:40 -------------------------------------------------------------------------------- [place] Details Patient Name: Date of Service: IREANNA, FINLAYSON [name] 12/24/2021 10:30 A M Medical Record Number: [id number] Patient Account Number: [id number] Date of Birth/Sex: Treating RN: 07-20-1925 (88 y.o. [name], Tammi Klippel Primary Care Aalina Brege: Marda Stalker Other Clinician: Referring Jantzen Pilger: Treating Shell Blanchette/Extender: Aviva Signs in Treatment: 3 Active Inactive Nutrition Nursing Diagnoses: Imbalanced nutrition Goals: Patient/caregiver agrees to and verbalizes understanding of need to use nutritional supplements and/or vitamins as  prescribed Date Initiated: 11/27/2021 Target Resolution Date: 01/22/2022 Goal Status: Active Interventions: Assess patient nutrition upon admission and as needed per policy Provide education on nutrition Treatment Activities: Education provided on Nutrition : 11/27/2021 Notes: Wound/Skin Impairment Nursing Diagnoses: Impaired tissue integrity Goals: Patient/caregiver will verbalize understanding of skin care regimen Date Initiated: 11/27/2021 Target Resolution Date: 01/22/2022 Goal Status: Active Ulcer/skin breakdown will have a volume reduction of 30% by week 4 Date Initiated: 11/27/2021 Target Resolution Date: 12/25/2021 Goal Status: Active Interventions: Assess patient/caregiver ability to obtain necessary supplies Assess patient/caregiver ability to perform ulcer/skin care regimen upon admission and as needed Assess ulceration(s) every visit Provide education on ulcer and skin care Treatment Activities: Topical wound management initiated : 11/27/2021 Notes: Electronic Signature(s) Signed: 12/24/2021 4:09:06 PM By: Deon Pilling RN, BSN Entered By: Deon Pilling on 12/24/2021 10:34:51 -------------------------------------------------------------------------------- Pain Assessment Details Patient Name: Date of Service: BROOKLEE, MICHELIN [name] 12/24/2021 10:30 A M Medical Record Number: [id number] Patient Account Number: [id number] Date of Birth/Sex: Treating RN: Feb 06, 1925 (88 y.o. [name] Primary Care Mccormick Macon: Marda Stalker Other Clinician: Referring Chele Cornell: Treating Tyara Dassow/Extender: Marjo Bicker Weeks in Treatment: 3 Active Problems Location of Pain Severity and Description  of Pain Patient Has Paino No Site Locations Rate the pain. Current Pain Level: 0 Pain Management and Medication Current Pain Management: Medication: No Cold Application: No Rest: No Massage: No Activity: No T.E.N.S.: No Heat Application: No Leg drop or elevation:  No Is the Current Pain Management Adequate: Adequate How does your wound impact your activities of daily livingo Sleep: No Bathing: No Appetite: No Relationship With Others: No Bladder Continence: No Emotions: No Bowel Continence: No Work: No Toileting: No Drive: No Dressing: No Hobbies: No Engineer, maintenance) Signed: 12/24/2021 4:09:06 PM By: Deon Pilling RN, BSN Entered By: Deon Pilling on 12/24/2021 10:25:19 -------------------------------------------------------------------------------- Patient/Caregiver Education Details Patient Name: Date of Service: Thalia Party [name] 2/2/2023andnbsp10:30 A M Medical Record Number: [id number] Patient Account Number: [id number] Date of Birth/Gender: Treating RN: 04/14/1925 (88 y.o. [name] Primary Care Physician: Marda Stalker Other Clinician: Referring Physician: Treating Physician/Extender: Aviva Signs in Treatment: 3 Education Assessment Education Provided To: Patient and Caregiver daughter Education Topics Provided Wound/Skin Impairment: Handouts: Skin Care Do's and Dont's Methods: Explain/Verbal Responses: Reinforcements needed Electronic Signature(s) Signed: 12/24/2021 4:09:06 PM By: Deon Pilling RN, BSN Entered By: Deon Pilling on 12/24/2021 10:37:48 -------------------------------------------------------------------------------- Wound Assessment Details Patient Name: Date of Service: KELLYANN, ORDWAY [name] 12/24/2021 10:30 A M Medical Record Number: [id number] Patient Account Number: [id number] Date of Birth/Sex: Treating RN: December 21, 1924 (88 y.o. [name], Meta.Reding Primary Care Katriana Dortch: Marda Stalker Other Clinician: Referring Avyan Livesay: Treating Geoff Dacanay/Extender: Marjo Bicker Weeks in Treatment: 3 Wound Status Wound Number: 26 Primary Venous Leg Ulcer Etiology: Wound Location: Left, Lateral Ankle Wound Open Wounding Event: Gradually  Appeared Status: Date Acquired: 11/20/2021 Comorbid Anemia, Congestive Heart Failure, Hypertension, Peripheral Weeks Of Treatment: 3 History: Venous Disease, Osteoarthritis, Received Chemotherapy Clustered Wound: No Photos Wound Measurements Length: (cm) 4.5 Width: (cm) 4.4 Depth: (cm) 0.1 Area: (cm) 15.551 Volume: (cm) 1.555 % Reduction in Area: -65% % Reduction in Volume: -65.1% Epithelialization: Small (1-33%) Tunneling: No Undermining: No Wound Description Classification: Full Thickness Without Exposed Support Structures Wound Margin: Distinct, outline attached Exudate Amount: Medium Exudate Type: Serosanguineous Exudate Color: red, brown Foul Odor After Cleansing: No Slough/Fibrino Yes Wound Bed Granulation Amount: Large (67-100%) Exposed Structure Granulation Quality: Red Fascia Exposed: No Necrotic Amount: Small (1-33%) Fat Layer (Subcutaneous Tissue) Exposed: Yes Necrotic Quality: Adherent Slough Tendon Exposed: No Muscle Exposed: No Joint Exposed: No Bone Exposed: No Treatment Notes Wound #26 (Ankle) Wound Laterality: Left, Lateral Cleanser Soap and Water Discharge Instruction: May shower and wash wound with dial antibacterial soap and water prior to dressing change. Peri-Wound Care Zinc Oxide Ointment 30g tube Discharge Instruction: Apply Zinc Oxide as needed to periwound with each dressing change Sween Lotion (Moisturizing lotion) Discharge Instruction: Apply moisturizing lotion as directed Topical Keystone topica Antibiotics Discharge Instruction: Mix medication according to pharmacy instruction apply to wound bed. Primary Dressing Hydrofera Blue Classic Foam, 4x4 in Discharge Instruction: Moisten with saline apply over the [facility] medication. Secondary Dressing Zetuvit Plus 4x8 in Discharge Instruction: Apply over primary dressing as directed. Secured With Compression Wrap ThreePress (3 layer compression wrap) Discharge Instruction: Apply  three layer compression as directed. Compression Stockings Add-Ons Electronic Signature(s) Signed: 12/24/2021 4:09:06 PM By: Deon Pilling RN, BSN Signed: 12/24/2021 4:09:06 PM By: Deon Pilling RN, BSN Entered By: Deon Pilling on 12/24/2021 10:34:35 -------------------------------------------------------------------------------- Vitals Details Patient Name: Date of Service: TITIANNA, LOOMIS [name] 12/24/2021 10:30 A M Medical Record Number: [id number] Patient Account Number: [id number] Date of Birth/Sex: Treating RN: 1925-07-22 (88 y.o. F) [place], [place]  Primary Care Chestine Belknap: Marda Stalker Other Clinician: Referring Kella Splinter: Treating Isahi Godwin/Extender: Aviva Signs in Treatment: 3 Vital Signs Time Taken: 10:20 Temperature (F): 97.7 Height (in): 66 Pulse (bpm): 64 Weight (lbs): 176 Respiratory Rate (breaths/min): 20 Body Mass Index (BMI): 28.4 Blood Pressure (mmHg): 134/72 Reference Range: 80 - 120 mg / dl Electronic Signature(s) Signed: 12/24/2021 4:09:06 PM By: Deon Pilling RN, BSN Entered By: Deon Pilling on 12/24/2021 10:25:08

## 2021-12-25 NOTE — Progress Notes (Signed)
[name], [name] ([id number]) Visit Report for 12/24/2021 Chief Complaint Document Details Patient Name: Date of Service: [name], [name] 12/24/2021 10:30 A M Medical Record Number: [id number] Patient Account Number: [id number] Date of Birth/Sex: Treating RN: [birth year] (88 y.o. [name] Primary Care Provider: Marda Stalker Other Clinician: Referring Provider: Treating Provider/Extender: Aviva Signs in Treatment: 3 Information Obtained from: Patient Chief Complaint Left lower extremity wound Electronic Signature(s) Signed: 12/24/2021 12:11:13 PM By: Kalman Shan DO Entered By: Kalman Shan on 12/24/2021 10:43:27 -------------------------------------------------------------------------------- HPI Details Patient Name: Date of Service: [name], [name] 12/24/2021 10:30 A M Medical Record Number: [id number] Patient Account Number: [id number] Date of Birth/Sex: Treating RN: [birth year] (88 y.o. [name] Primary Care Provider: Marda Stalker Other Clinician: Referring Provider: Treating Provider/Extender: Aviva Signs in Treatment: 3 History of Present Illness HPI Description: this is a patient we know from several prior wounds on her bilateral lower extremities. She has venous stasis physiology, inflammation and hypertension. She is been fully evaluated for venous ablation and was found not to be a candidate. She does not have significant arterial disease. 02/14/15 the wound itself on her lateral left leg did not look too bad however there was surrounding maceration which was concerning 02/27/15 the wound itself is small with surrounding circumferential epithelialization there is no surrounding maceration. 03/06/15 only a very small area remains. I think this is mostly epithelialized however I would be uncomfortable not dressing this this week 03/13/15 the area is epithelialized. There was a thick surface  on this. I took a #15 blade then lightly disrupted it but there does not appear to be any open area I did not see anything but appropriate epithelium Readmission: 08/16/18 on evaluation today patient presents for initial evaluation and our clinic released readmission although she has not been seen since April 2016. Nonetheless she is having issues with the ulcers currently on the bilateral lateral malleolus or locations as well as the left lateral lower extremity. These have been present for roughly 3 weeks since the patient actually developed significant bilateral lower extremity edema which patient's daughter states was roughly 3 times the size of her legs currently. Subsequently she ended up in the emerging department due to congestive heart failure. They were able to get the swelling under control and fortunately she seems to be doing much better at this point in time. She was placed in an [employer] which she sat on for week part evaluation today as well. Fortunately the wound areas actually seem to be doing fairly well there is some macerated skin surrounding there are the areas/surface of the wound which are gonna require some debridement at this point. No fevers, chills, nausea, or vomiting noted at this time. Patient has a history of hypertension, mild peripheral vascular disease, chronic venous stasis, and other than the ulcer seems to be doing fairly well today. No fevers, chills, nausea, or vomiting noted at this time. 08/23/18 on evaluation today patient actually appears to be doing great in regard to her bilateral lower should be ulcers. She's shown signs of improvement even just with one week with the [employer]. In general I'm actually very pleased with how things appear at this point. 09/06/18 on evaluation today patient actually appears to be doing much better her left lower extremity is completely healed. Go to the right lower extremity lateral malleolus ulcer this still  seems to be showing signs of being open. There does not appear to be evidence  of infection which is good news. This did require some sharp debridement however to remove some of the necrotic tissue/eschar on the surface of the wound Readmission 11/27/2021 [name] is a 88 year old female with a past medical history of endometrial and breast cancer, chronic venous insufficiency, dementia and essential hypertension that presents to the clinic with a 1 month history of nonhealing ulcer to the left lower extremity. She states that the ulcer opened 1 month ago and then healed but has reopened again in the past week. She is currently keeping the area covered. She has a history of wounds to her lower extremities. She has compression stockings that she uses daily. She currently denies signs of infection. 1/13; patient presents for follow-up. She followed up for nurse visit for wrap change. She reports more odor. She denies pain. 1/20; patient presents for follow-up. She has been using gentamicin ointment with calcium alginate to the wound bed. She reports improvement in odor and drainage. She currently denies systemic signs of infection. 1/27; patient presents for follow-up. She has been using gentamicin ointment with calcium alginate. She received her Keystone antibiotic 2 days ago. She brought this in. Home health has also been established. They will come out for the first time next week. She currently denies signs of infection. She has no issues or complaints today. 2/2; patient presents for follow-up. She has been using Keystone antibiotics with [facility] with no issues. She denies signs of infection. She has no issues or complaints today. Electronic Signature(s) Signed: 12/24/2021 12:11:13 PM By: Kalman Shan DO Entered By: Kalman Shan on 12/24/2021 10:44:04 -------------------------------------------------------------------------------- Physical Exam Details Patient Name: Date  of Service: [name], [name] 12/24/2021 10:30 A M Medical Record Number: [id number] Patient Account Number: [id number] Date of Birth/Sex: Treating RN: 05-09-1925 (88 y.o. [name] Primary Care Provider: Marda Stalker Other Clinician: Referring Provider: Treating Provider/Extender: Marjo Bicker Weeks in Treatment: 3 Constitutional respirations regular, non-labored and within target range for patient.. Cardiovascular 2+ dorsalis pedis/posterior tibialis pulses. Psychiatric pleasant and cooperative. Notes Left lower extremity: T the distal lateral aspect there is an open wound with granulation tissue and scant nonviable tissue. No drainage noted. No odor noted. o Improvement in appearance since last clinic visit. Good edema control. No signs of surrounding infection. Electronic Signature(s) Signed: 12/24/2021 12:11:13 PM By: Kalman Shan DO Entered By: Kalman Shan on 12/24/2021 10:44:47 -------------------------------------------------------------------------------- Physician Orders Details Patient Name: Date of Service: [name], [name] 12/24/2021 10:30 A M Medical Record Number: [id number] Patient Account Number: [id number] Date of Birth/Sex: Treating RN: Feb 28, 1925 (88 y.o. [name], Meta.Reding Primary Care Provider: Marda Stalker Other Clinician: Referring Provider: Treating Provider/Extender: Aviva Signs in Treatment: 3 Verbal / Phone Orders: No Diagnosis Coding ICD-10 Coding Code Description [phone] Chronic venous hypertension (idiopathic) with ulcer of left lower extremity L97.822 Non-pressure chronic ulcer of other part of left lower leg with fat layer exposed C50.919 Malignant neoplasm of unspecified site of unspecified female breast I50.42 Chronic combined systolic (congestive) and diastolic (congestive) heart failure G30.9 Alzheimer's disease, unspecified C54.1 Malignant neoplasm of  endometrium Follow-up Appointments ppointment in 1 week. - Dr. Heber [place] and Tammi Klippel, RN Return A ***Continue to bring keystone topical antibiotic medication to each visit.*** Bathing/ Shower/ Hygiene May shower with protection but do not get wound dressing(s) wet. - use cast protector. May shower and wash wound with soap and water. - with dressing changes may wash with soap and water. Edema Control - Lymphedema / SCD / Other  Elevate legs to the level of the heart or above for 30 minutes daily and/or when sitting, a frequency of: Avoid standing for long periods of time. Patient to wear own compression stockings every day. - apply in the morning and remove at night to right leg. Exercise regularly Moisturize legs daily. - every night before day right leg. Additional Orders / Instructions Follow Nutritious Diet - High Protein Diet Home Health No change in wound care orders this week; continue Home Health for wound care. May utilize formulary equivalent dressing for wound treatment orders unless otherwise specified. - Home Health for skilled wound care nursing once a week and wound center weekly. Apply keystone topical antibiotics and hydrofera blue classic as primary dressing. Other Home Health Orders/Instructions: [facility] Wound Treatment Wound #26 - Ankle Wound Laterality: Left, Lateral Cleanser: Soap and Water (Home Health) 2 x Per Week/30 Days Discharge Instructions: May shower and wash wound with dial antibacterial soap and water prior to dressing change. Peri-Wound Care: Zinc Oxide Ointment 30g tube (Home Health) 2 x Per Week/30 Days Discharge Instructions: Apply Zinc Oxide as needed to periwound with each dressing change Peri-Wound Care: Sween Lotion (Moisturizing lotion) (Home Health) 2 x Per Week/30 Days Discharge Instructions: Apply moisturizing lotion as directed Topical: Keystone topica Antibiotics (Home Health) 2 x Per Week/30 Days Discharge Instructions: Mix  medication according to pharmacy instruction apply to wound bed. Prim Dressing: Hydrofera Blue Classic Foam, 4x4 in (Home Health) 2 x Per Week/30 Days ary Discharge Instructions: Moisten with saline apply over the [facility] medication. Secondary Dressing: Zetuvit Plus 4x8 in [facility]) 2 x Per Week/30 Days Discharge Instructions: Apply over primary dressing as directed. Compression Wrap: ThreePress (3 layer compression wrap) (Home Health) 2 x Per Week/30 Days Discharge Instructions: Apply three layer compression as directed. Electronic Signature(s) Signed: 12/24/2021 12:11:13 PM By: Kalman Shan DO Entered By: Kalman Shan on 12/24/2021 10:45:05 -------------------------------------------------------------------------------- Problem List Details Patient Name: Date of Service: [name], [name] 12/24/2021 10:30 A M Medical Record Number: [id number] Patient Account Number: [id number] Date of Birth/Sex: Treating RN: 03-Oct-1925 (88 y.o. [name], Meta.Reding Primary Care Provider: Marda Stalker Other Clinician: Referring Provider: Treating Provider/Extender: Aviva Signs in Treatment: 3 Active Problems ICD-10 Encounter Code Description Active Date MDM Diagnosis I87.312 Chronic venous hypertension (idiopathic) with ulcer of left lower extremity 11/27/2021 No Yes L97.822 Non-pressure chronic ulcer of other part of left lower leg with fat layer exposed1/04/2022 No Yes C50.919 Malignant neoplasm of unspecified site of unspecified female breast 11/27/2021 No Yes I50.42 Chronic combined systolic (congestive) and diastolic (congestive) heart failure 11/27/2021 No Yes G30.9 Alzheimer's disease, unspecified 11/27/2021 No Yes C54.1 Malignant neoplasm of endometrium 11/27/2021 No Yes Inactive Problems Resolved Problems Electronic Signature(s) Signed: 12/24/2021 12:11:13 PM By: Kalman Shan DO Entered By: Kalman Shan on 12/24/2021  10:41:57 -------------------------------------------------------------------------------- Progress Note Details Patient Name: Date of Service: [name], [name] 12/24/2021 10:30 A M Medical Record Number: [id number] Patient Account Number: [id number] Date of Birth/Sex: Treating RN: 09-13-1925 (88 y.o. [name] Primary Care Provider: Marda Stalker Other Clinician: Referring Provider: Treating Provider/Extender: Aviva Signs in Treatment: 3 Subjective Chief Complaint Information obtained from Patient Left lower extremity wound History of Present Illness (HPI) this is a patient we know from several prior wounds on her bilateral lower extremities. She has venous stasis physiology, inflammation and hypertension. She is been fully evaluated for venous ablation and was found not to be a candidate. She does not have significant arterial  disease. 02/14/15 the wound itself on her lateral left leg did not look too bad however there was surrounding maceration which was concerning 02/27/15 the wound itself is small with surrounding circumferential epithelialization there is no surrounding maceration. 03/06/15 only a very small area remains. I think this is mostly epithelialized however I would be uncomfortable not dressing this this week 03/13/15 the area is epithelialized. There was a thick surface on this. I took a #15 blade then lightly disrupted it but there does not appear to be any open area I did not see anything but appropriate epithelium Readmission: 08/16/18 on evaluation today patient presents for initial evaluation and our clinic released readmission although she has not been seen since April 2016. Nonetheless she is having issues with the ulcers currently on the bilateral lateral malleolus or locations as well as the left lateral lower extremity. These have been present for roughly 3 weeks since the patient actually developed significant bilateral lower  extremity edema which patient's daughter states was roughly 3 times the size of her legs currently. Subsequently she ended up in the emerging department due to congestive heart failure. They were able to get the swelling under control and fortunately she seems to be doing much better at this point in time. She was placed in an [employer] which she sat on for week part evaluation today as well. Fortunately the wound areas actually seem to be doing fairly well there is some macerated skin surrounding there are the areas/surface of the wound which are gonna require some debridement at this point. No fevers, chills, nausea, or vomiting noted at this time. Patient has a history of hypertension, mild peripheral vascular disease, chronic venous stasis, and other than the ulcer seems to be doing fairly well today. No fevers, chills, nausea, or vomiting noted at this time. 08/23/18 on evaluation today patient actually appears to be doing great in regard to her bilateral lower should be ulcers. She's shown signs of improvement even just with one week with the [employer]. In general I'm actually very pleased with how things appear at this point. 09/06/18 on evaluation today patient actually appears to be doing much better her left lower extremity is completely healed. Go to the right lower extremity lateral malleolus ulcer this still seems to be showing signs of being open. There does not appear to be evidence of infection which is good news. This did require some sharp debridement however to remove some of the necrotic tissue/eschar on the surface of the wound Readmission 11/27/2021 [name] is a 88 year old female with a past medical history of endometrial and breast cancer, chronic venous insufficiency, dementia and essential hypertension that presents to the clinic with a 1 month history of nonhealing ulcer to the left lower extremity. She states that the ulcer opened 1 month ago and then  healed but has reopened again in the past week. She is currently keeping the area covered. She has a history of wounds to her lower extremities. She has compression stockings that she uses daily. She currently denies signs of infection. 1/13; patient presents for follow-up. She followed up for nurse visit for wrap change. She reports more odor. She denies pain. 1/20; patient presents for follow-up. She has been using gentamicin ointment with calcium alginate to the wound bed. She reports improvement in odor and drainage. She currently denies systemic signs of infection. 1/27; patient presents for follow-up. She has been using gentamicin ointment with calcium alginate. She received her [employer]  antibiotic 2 days ago. She brought this in. Home health has also been established. They will come out for the first time next week. She currently denies signs of infection. She has no issues or complaints today. 2/2; patient presents for follow-up. She has been using Keystone antibiotics with [facility] with no issues. She denies signs of infection. She has no issues or complaints today. Patient History Information obtained from Patient. Family History Cancer - Child, Hypertension - Child,Father, Kidney Disease - Child, Stroke - Siblings,Child, Thyroid Problems - Child, No family history of Diabetes, Heart Disease, Hereditary Spherocytosis, Lung Disease, Seizures, Tuberculosis. Social History Never smoker, Marital Status - Widowed, Alcohol Use - Never, Drug Use - No History, Caffeine Use - Never. Medical History Eyes Denies history of Cataracts, Glaucoma, Optic Neuritis Ear/Nose/Mouth/Throat Denies history of Chronic sinus problems/congestion, Middle ear problems Hematologic/Lymphatic Patient has history of Anemia Denies history of Hemophilia, Human Immunodeficiency Virus, Lymphedema, Sickle Cell Disease Respiratory Denies history of Aspiration, Asthma, Chronic Obstructive Pulmonary Disease  (COPD), Pneumothorax, Sleep Apnea, Tuberculosis Cardiovascular Patient has history of Congestive Heart Failure, Hypertension, Peripheral Venous Disease Denies history of Angina, Arrhythmia, Coronary Artery Disease, Hypotension, Myocardial Infarction, Peripheral Arterial Disease, Phlebitis, Vasculitis Gastrointestinal Denies history of Cirrhosis , Colitis, Crohnoos, Hepatitis A, Hepatitis B, Hepatitis C Endocrine Denies history of Type I Diabetes, Type II Diabetes Genitourinary Denies history of End Stage Renal Disease Immunological Denies history of Lupus Erythematosus, Raynaudoos, Scleroderma Integumentary (Skin) Denies history of History of Burn Musculoskeletal Patient has history of Osteoarthritis Denies history of Gout, Rheumatoid Arthritis, Osteomyelitis Neurologic Denies history of Neuropathy, Quadriplegia, Paraplegia, Seizure Disorder Oncologic Patient has history of Received Chemotherapy - Breast and Ovarian Cancer Denies history of Received Radiation Hospitalization/Surgery History - diverticulits. Objective Constitutional respirations regular, non-labored and within target range for patient.. Vitals Time Taken: 10:20 AM, Height: 66 in, Weight: 176 lbs, BMI: 28.4, Temperature: 97.7 F, Pulse: 64 bpm, Respiratory Rate: 20 breaths/min, Blood Pressure: 134/72 mmHg. Cardiovascular 2+ dorsalis pedis/posterior tibialis pulses. Psychiatric pleasant and cooperative. General Notes: Left lower extremity: T the distal lateral aspect there is an open wound with granulation tissue and scant nonviable tissue. No drainage noted. o No odor noted. Improvement in appearance since last clinic visit. Good edema control. No signs of surrounding infection. Integumentary (Hair, Skin) Wound #26 status is Open. Original cause of wound was Gradually Appeared. The date acquired was: 11/20/2021. The wound has been in treatment 3 weeks. The wound is located on the Left,Lateral Ankle. The wound  measures 4.5cm length x 4.4cm width x 0.1cm depth; 15.551cm^2 area and 1.555cm^3 volume. There is Fat Layer (Subcutaneous Tissue) exposed. There is no tunneling or undermining noted. There is a medium amount of serosanguineous drainage noted. The wound margin is distinct with the outline attached to the wound base. There is large (67-100%) red granulation within the wound bed. There is a small (1-33%) amount of necrotic tissue within the wound bed including Adherent Slough. Assessment Active Problems ICD-10 Chronic venous hypertension (idiopathic) with ulcer of left lower extremity Non-pressure chronic ulcer of other part of left lower leg with fat layer exposed Malignant neoplasm of unspecified site of unspecified female breast Chronic combined systolic (congestive) and diastolic (congestive) heart failure Alzheimer's disease, unspecified Malignant neoplasm of endometrium Patient's wound has shown improvement in appearance since last clinic visit. There is more granulation tissue present. The area appears healthier. I recommended continuing with Keystone antibiotic and Hydrofera Blue under compression therapy. No signs of surrounding infection. Follow-up in 1 week. Procedures  Wound #26 Pre-procedure diagnosis of Wound #26 is a Venous Leg Ulcer located on the Left,Lateral Ankle . There was a Three Layer Compression Therapy Procedure by Deon Pilling, RN. Post procedure Diagnosis Wound #26: Same as Pre-Procedure Plan Follow-up Appointments: Return Appointment in 1 week. - Dr. Heber [place] and Tammi Klippel, RN ***Continue to bring keystone topical antibiotic medication to each visit.*** Bathing/ Shower/ Hygiene: May shower with protection but do not get wound dressing(s) wet. - use cast protector. May shower and wash wound with soap and water. - with dressing changes may wash with soap and water. Edema Control - Lymphedema / SCD / Other: Elevate legs to the level of the heart or above for 30 minutes  daily and/or when sitting, a frequency of: Avoid standing for long periods of time. Patient to wear own compression stockings every day. - apply in the morning and remove at night to right leg. Exercise regularly Moisturize legs daily. - every night before day right leg. Additional Orders / Instructions: Follow Nutritious Diet - High Protein Diet Home Health: No change in wound care orders this week; continue Home Health for wound care. May utilize formulary equivalent dressing for wound treatment orders unless otherwise specified. - Home Health for skilled wound care nursing once a week and wound center weekly. Apply keystone topical antibiotics and hydrofera blue classic as primary dressing. Other Home Health Orders/Instructions: [facility] WOUND #26: - Ankle Wound Laterality: Left, Lateral Cleanser: Soap and Water (Home Health) 2 x Per Week/30 Days Discharge Instructions: May shower and wash wound with dial antibacterial soap and water prior to dressing change. Peri-Wound Care: Zinc Oxide Ointment 30g tube (Home Health) 2 x Per Week/30 Days Discharge Instructions: Apply Zinc Oxide as needed to periwound with each dressing change Peri-Wound Care: Sween Lotion (Moisturizing lotion) (Home Health) 2 x Per Week/30 Days Discharge Instructions: Apply moisturizing lotion as directed Topical: Keystone topica Antibiotics (Home Health) 2 x Per Week/30 Days Discharge Instructions: Mix medication according to pharmacy instruction apply to wound bed. Prim Dressing: Hydrofera Blue Classic Foam, 4x4 in (Home Health) 2 x Per Week/30 Days ary Discharge Instructions: Moisten with saline apply over the [facility] medication. Secondary Dressing: Zetuvit Plus 4x8 in [facility]) 2 x Per Week/30 Days Discharge Instructions: Apply over primary dressing as directed. Compression Wrap: ThreePress (3 layer compression wrap) (Home Health) 2 x Per Week/30 Days Discharge Instructions: Apply three layer  compression as directed. 1. Hydrofera Blue and Keystone antibiotic under 3 layer compression 2. Follow-up in 1 week Electronic Signature(s) Signed: 12/24/2021 12:11:13 PM By: Kalman Shan DO Entered By: Kalman Shan on 12/24/2021 10:47:03 -------------------------------------------------------------------------------- HxROS Details Patient Name: Date of Service: [name], [name] 12/24/2021 10:30 A M Medical Record Number: [id number] Patient Account Number: [id number] Date of Birth/Sex: Treating RN: 1925/11/18 (88 y.o. [name] Primary Care Provider: Marda Stalker Other Clinician: Referring Provider: Treating Provider/Extender: Aviva Signs in Treatment: 3 Information Obtained From Patient Eyes Medical History: Negative for: Cataracts; Glaucoma; Optic Neuritis Ear/Nose/Mouth/Throat Medical History: Negative for: Chronic sinus problems/congestion; Middle ear problems Hematologic/Lymphatic Medical History: Positive for: Anemia Negative for: Hemophilia; Human Immunodeficiency Virus; Lymphedema; Sickle Cell Disease Respiratory Medical History: Negative for: Aspiration; Asthma; Chronic Obstructive Pulmonary Disease (COPD); Pneumothorax; Sleep Apnea; Tuberculosis Cardiovascular Medical History: Positive for: Congestive Heart Failure; Hypertension; Peripheral Venous Disease Negative for: Angina; Arrhythmia; Coronary Artery Disease; Hypotension; Myocardial Infarction; Peripheral Arterial Disease; Phlebitis; Vasculitis Gastrointestinal Medical History: Negative for: Cirrhosis ; Colitis; Crohns; Hepatitis A; Hepatitis B; Hepatitis C Endocrine  Medical History: Negative for: Type I Diabetes; Type II Diabetes Genitourinary Medical History: Negative for: End Stage Renal Disease Immunological Medical History: Negative for: Lupus Erythematosus; Raynauds; Scleroderma Integumentary (Skin) Medical History: Negative for: History of  Burn Musculoskeletal Medical History: Positive for: Osteoarthritis Negative for: Gout; Rheumatoid Arthritis; Osteomyelitis Neurologic Medical History: Negative for: Neuropathy; Quadriplegia; Paraplegia; Seizure Disorder Oncologic Medical History: Positive for: Received Chemotherapy - Breast and Ovarian Cancer Negative for: Received Radiation Immunizations Pneumococcal Vaccine: Received Pneumococcal Vaccination: Yes Received Pneumococcal Vaccination On or After 60th Birthday: Yes Implantable Devices None Hospitalization / Surgery History Type of Hospitalization/Surgery diverticulits Family and Social History Cancer: Yes - Child; Diabetes: No; Heart Disease: No; Hereditary Spherocytosis: No; Hypertension: Yes - Child,Father; Kidney Disease: Yes - Child; Lung Disease: No; Seizures: No; Stroke: Yes - Siblings,Child; Thyroid Problems: Yes - Child; Tuberculosis: No; Never smoker; Marital Status - Widowed; Alcohol Use: Never; Drug Use: No History; Caffeine Use: Never; Financial Concerns: No; Food, Clothing or Shelter Needs: No; Support System Lacking: No; Transportation Concerns: No Electronic Signature(s) Signed: 12/24/2021 12:11:13 PM By: Kalman Shan DO Signed: 12/25/2021 1:32:11 PM By: Lorrin Jackson Entered By: Kalman Shan on 12/24/2021 10:44:11 -------------------------------------------------------------------------------- SuperBill Details Patient Name: Date of Service: OLIVYA, SOBOL [name] 12/24/2021 Medical Record Number: [id number] Patient Account Number: [id number] Date of Birth/Sex: Treating RN: [birth year] (88 y.o. [name], Meta.Reding Primary Care Provider: Marda Stalker Other Clinician: Referring Provider: Treating Provider/Extender: Marjo Bicker Weeks in Treatment: 3 Diagnosis Coding ICD-10 Codes Code Description [id number] Chronic venous hypertension (idiopathic) with ulcer of left lower extremity L97.822 Non-pressure chronic ulcer of other  part of left lower leg with fat layer exposed C50.919 Malignant neoplasm of unspecified site of unspecified female breast I50.42 Chronic combined systolic (congestive) and diastolic (congestive) heart failure G30.9 Alzheimer's disease, unspecified C54.1 Malignant neoplasm of endometrium Facility Procedures CPT4 Code: [id number] Description: (Facility Use Only) [phone] - [facility] Modifier: Quantity: 1 Physician Procedures : CPT4 Code Description Modifier [id number] - WC PHYS LEVEL 3 - EST PT ICD-10 Diagnosis Description I87.312 Chronic venous hypertension (idiopathic) with ulcer of left lower extremity L97.822 Non-pressure chronic ulcer of other part of left lower leg  with fat layer exposed I50.42 Chronic combined systolic (congestive) and diastolic (congestive) heart failure G30.9 Alzheimer's disease, unspecified Quantity: 1 Electronic Signature(s) Signed: 12/24/2021 12:11:13 PM By: Kalman Shan DO Entered By: Kalman Shan on 12/24/2021 10:50:09

## 2021-12-31 ENCOUNTER — Encounter (HOSPITAL_BASED_OUTPATIENT_CLINIC_OR_DEPARTMENT_OTHER): Payer: Medicare PPO | Admitting: Internal Medicine

## 2021-12-31 ENCOUNTER — Other Ambulatory Visit: Payer: Self-pay

## 2021-12-31 DIAGNOSIS — I5042 Chronic combined systolic (congestive) and diastolic (congestive) heart failure: Secondary | ICD-10-CM | POA: Diagnosis not present

## 2021-12-31 DIAGNOSIS — I110 Hypertensive heart disease with heart failure: Secondary | ICD-10-CM | POA: Diagnosis not present

## 2021-12-31 DIAGNOSIS — I739 Peripheral vascular disease, unspecified: Secondary | ICD-10-CM | POA: Diagnosis not present

## 2021-12-31 DIAGNOSIS — L97822 Non-pressure chronic ulcer of other part of left lower leg with fat layer exposed: Secondary | ICD-10-CM | POA: Diagnosis not present

## 2021-12-31 DIAGNOSIS — M1990 Unspecified osteoarthritis, unspecified site: Secondary | ICD-10-CM | POA: Diagnosis not present

## 2021-12-31 DIAGNOSIS — I872 Venous insufficiency (chronic) (peripheral): Secondary | ICD-10-CM | POA: Diagnosis not present

## 2021-12-31 NOTE — Progress Notes (Signed)
[name], [name] ([id number]) Visit Report for 12/31/2021 Chief Complaint Document Details Patient Name: Date of Service: [name], [name] 12/31/2021 10:15 A [name] Medical Record Number: [id number] Patient Account Number: [id number] Date of Birth/Sex: Treating RN: 1925/09/29 (88 y.o. F) Primary Care Provider: Marda [name] Other Clinician: Referring Provider: Treating Provider/Extender: [name] in Treatment: 4 Information Obtained from: Patient Chief Complaint Left lower extremity wound Electronic Signature(s) Signed: 12/31/2021 11:19:45 AM By: [name] Shan DO Entered By: [name] on 12/31/2021 11:16:06 -------------------------------------------------------------------------------- Debridement Details Patient Name: Date of Service: [name], [name] 12/31/2021 10:15 A [name] Medical Record Number: [id number] Patient Account Number: [id number] Date of Birth/Sex: Treating RN: 10/08/1925 (88 y.o. [name], Meta.Reding Primary Care Provider: Marda [name] Other Clinician: Referring Provider: Treating Provider/Extender: [name] in Treatment: 4 Debridement Performed for Assessment: Wound #26 Left,Lateral Ankle Performed By: Physician [name] Shan, DO Debridement Type: Debridement Severity of Tissue Pre Debridement: Fat layer exposed Level of Consciousness (Pre-procedure): Awake and Alert Pre-procedure Verification/Time Out Yes - 10:48 Taken: Start Time: 10:49 Pain Control: Lidocaine 5% topical ointment T Area Debrided (L x W): otal 4.3 (cm) x 3.9 (cm) = 16.77 (cm) Tissue and other material debrided: Viable, Non-Viable, Slough, Subcutaneous, Skin: Dermis , Skin: Epidermis, Fibrin/Exudate, Slough Level: Skin/Subcutaneous Tissue Debridement Description: Excisional Instrument: Curette Bleeding: Minimum Hemostasis Achieved: Pressure End Time: 10:52 Procedural Pain: 0 Post Procedural Pain: 0 Response to  Treatment: Procedure was tolerated well Level of Consciousness (Post- Awake and Alert procedure): Post Debridement Measurements of Total Wound Length: (cm) 4.3 Width: (cm) 3.9 Depth: (cm) 0.1 Volume: (cm) 1.317 Character of Wound/Ulcer Post Debridement: Improved Severity of Tissue Post Debridement: Fat layer exposed Post Procedure Diagnosis Same as Pre-procedure Electronic Signature(s) Signed: 12/31/2021 11:19:45 AM By: [name] Shan DO Signed: 12/31/2021 5:47:56 PM By: [name] Pilling RN, BSN Entered By: [name] on 12/31/2021 10:53:08 -------------------------------------------------------------------------------- HPI Details Patient Name: Date of Service: [name], [name] 12/31/2021 10:15 A [name] Medical Record Number: [id number] Patient Account Number: [id number] Date of Birth/Sex: Treating RN: 04/28/1925 (88 y.o. F) Primary Care Provider: Marda [name] Other Clinician: Referring Provider: Treating Provider/Extender: [name] in Treatment: 4 History of Present Illness HPI Description: this is a patient we know from several prior wounds on her bilateral lower extremities. She has venous stasis physiology, inflammation and hypertension. She is been fully evaluated for venous ablation and was found not to be a candidate. She does not have significant arterial disease. 02/14/15 the wound itself on her lateral left leg did not look too bad however there was surrounding maceration which was concerning 02/27/15 the wound itself is small with surrounding circumferential epithelialization there is no surrounding maceration. 03/06/15 only a very small area remains. I think this is mostly epithelialized however I would be uncomfortable not dressing this this week 03/13/15 the area is epithelialized. There was a thick surface on this. I took a #15 blade then lightly disrupted it but there does not appear to be any open area I did not see anything but appropriate  epithelium Readmission: 08/16/18 on evaluation today patient presents for initial evaluation and our clinic released readmission although she has not been seen since April 2016. Nonetheless she is having issues with the ulcers currently on the bilateral lateral malleolus or locations as well as the left lateral lower extremity. These have been present for roughly 3 [name] since the patient actually developed significant bilateral lower extremity edema which patient's daughter states was roughly 3 times the size  of her legs currently. Subsequently she ended up in the emerging department due to congestive heart failure. They were able to get the swelling under control and fortunately she seems to be doing much better at this point in time. She was placed in an [employer] which she sat on for week part evaluation today as well. Fortunately the wound areas actually seem to be doing fairly well there is some macerated skin surrounding there are the areas/surface of the wound which are gonna require some debridement at this point. No fevers, chills, nausea, or vomiting noted at this time. Patient has a history of hypertension, mild peripheral vascular disease, chronic venous stasis, and other than the ulcer seems to be doing fairly well today. No fevers, chills, nausea, or vomiting noted at this time. 08/23/18 on evaluation today patient actually appears to be doing great in regard to her bilateral lower should be ulcers. She's shown [name] of improvement even just with one week with the [employer]. In general I'[name] actually very pleased with how things appear at this point. 09/06/18 on evaluation today patient actually appears to be doing much better her left lower extremity is completely healed. Go to the right lower extremity lateral malleolus ulcer this still seems to be showing [name] of being open. There does not appear to be evidence of infection which is good news. This did require some sharp  debridement however to remove some of the necrotic tissue/eschar on the surface of the wound Readmission 11/27/2021 [name] is a 88 year old female with a past medical history of endometrial and breast cancer, chronic venous insufficiency, dementia and essential hypertension that presents to the clinic with a 1 month history of nonhealing ulcer to the left lower extremity. She states that the ulcer opened 1 month ago and then healed but has reopened again in the past week. She is currently keeping the area covered. She has a history of wounds to her lower extremities. She has compression stockings that she uses daily. She currently denies [name] of infection. 1/13; patient presents for follow-up. She followed up for nurse visit for wrap change. She reports more odor. She denies pain. 1/20; patient presents for follow-up. She has been using gentamicin ointment with calcium alginate to the wound bed. She reports improvement in odor and drainage. She currently denies systemic [name] of infection. 1/27; patient presents for follow-up. She has been using gentamicin ointment with calcium alginate. She received her Keystone antibiotic 2 days ago. She brought this in. Home health has also been established. They will come out for the first time next week. She currently denies [name] of infection. She has no issues or complaints today. 2/2; patient presents for follow-up. She has been using Keystone antibiotics with [facility] with no issues. She denies [name] of infection. She has no issues or complaints today. 2/9; patient presents for follow-up. She has been using Keystone antibiotics with [facility] with no issues. She has tolerated the compression wrap well. She denies [name] of infection. Electronic Signature(s) Signed: 12/31/2021 11:19:45 AM By: [name] Shan DO Entered By: [name] on 12/31/2021  11:16:44 -------------------------------------------------------------------------------- Physical Exam Details Patient Name: Date of Service: [name], [name] 12/31/2021 10:15 A [name] Medical Record Number: [id number] Patient Account Number: [id number] Date of Birth/Sex: Treating RN: Feb 28, 1925 (88 y.o. F) Primary Care Provider: Marda [name] Other Clinician: Referring Provider: Treating Provider/Extender: [name] in Treatment: 4 Constitutional respirations regular, non-labored and within target range for patient.. Cardiovascular 2+  dorsalis pedis/posterior tibialis pulses. Psychiatric pleasant and cooperative. Notes Left lower extremity: T the distal lateral aspect there is an open wound with granulation tissue and nonviable tissue. Appears well-healing. No [name] of o surrounding infection. Electronic Signature(s) Signed: 12/31/2021 11:19:45 AM By: [name] Shan DO Entered By: [name] on 12/31/2021 11:17:27 -------------------------------------------------------------------------------- Physician Orders Details Patient Name: Date of Service: [name], [name] 12/31/2021 10:15 A [name] Medical Record Number: [id number] Patient Account Number: [id number] Date of Birth/Sex: Treating RN: 1925-01-28 (88 y.o. [name], Meta.Reding Primary Care Provider: Marda [name] Other Clinician: Referring Provider: Treating Provider/Extender: [name] in Treatment: 4 Verbal / Phone Orders: No Diagnosis Coding ICD-10 Coding Code Description [phone] Chronic venous hypertension (idiopathic) with ulcer of left lower extremity L97.822 Non-pressure chronic ulcer of other part of left lower leg with fat layer exposed C50.919 Malignant neoplasm of unspecified site of unspecified female breast I50.42 Chronic combined systolic (congestive) and diastolic (congestive) heart failure G30.9 Alzheimer's disease, unspecified C54.1  Malignant neoplasm of endometrium Follow-up Appointments ppointment in 1 week. - Dr. Heber [place] and Tammi Klippel, RN Return A ***Continue to bring keystone topical antibiotic medication to each visit.*** Bathing/ Shower/ Hygiene May shower with protection but do not get wound dressing(s) wet. - use cast protector. May shower and wash wound with soap and water. - with dressing changes may wash with soap and water. Edema Control - Lymphedema / SCD / Other Elevate legs to the level of the heart or above for 30 minutes daily and/or when sitting, a frequency of: Avoid standing for long periods of time. Patient to wear own compression stockings every day. - apply in the morning and remove at night to right leg. Exercise regularly Moisturize legs daily. - every night before day right leg. Additional Orders / Instructions Follow Nutritious Diet - High Protein Diet Home Health No change in wound care orders this week; continue Home Health for wound care. May utilize formulary equivalent dressing for wound treatment orders unless otherwise specified. - Home Health for skilled wound care nursing once a week and wound center weekly. Apply keystone topical antibiotics and hydrofera blue classic as primary dressing. Other Home Health Orders/Instructions: [facility] home health Wound Treatment Wound #26 - Ankle Wound Laterality: Left, Lateral Cleanser: Soap and Water (Home Health) 2 x Per Week/30 Days Discharge Instructions: May shower and wash wound with dial antibacterial soap and water prior to dressing change. Peri-Wound Care: Sween Lotion (Moisturizing lotion) (Home Health) 2 x Per Week/30 Days Discharge Instructions: Apply moisturizing lotion as directed Topical: Keystone topica Antibiotics (Home Health) 2 x Per Week/30 Days Discharge Instructions: Mix medication according to pharmacy instruction apply to wound bed. Prim Dressing: Hydrofera Blue Classic Foam, 4x4 in (Home Health) 2 x Per Week/30  Days ary Discharge Instructions: Moisten with saline apply over the [facility] medication. Secondary Dressing: Zetuvit Plus 4x8 in [facility]) 2 x Per Week/30 Days Discharge Instructions: Apply over primary dressing as directed. Compression Wrap: ThreePress (3 layer compression wrap) (Home Health) 2 x Per Week/30 Days Discharge Instructions: Apply three layer compression as directed. Electronic Signature(s) Signed: 12/31/2021 11:19:45 AM By: [name] Shan DO Entered By: [name] on 12/31/2021 11:17:50 -------------------------------------------------------------------------------- Problem List Details Patient Name: Date of Service: [name], [name] 12/31/2021 10:15 A [name] Medical Record Number: [id number] Patient Account Number: [id number] Date of Birth/Sex: Treating RN: Jun 02, 1925 (88 y.o. [name] Primary Care Provider: Marda [name] Other Clinician: Referring Provider: Treating Provider/Extender: [name] in Treatment: 4 Active Problems ICD-10  Encounter Code Description Active Date MDM Diagnosis I87.312 Chronic venous hypertension (idiopathic) with ulcer of left lower extremity 11/27/2021 No Yes L97.822 Non-pressure chronic ulcer of other part of left lower leg with fat layer exposed1/04/2022 No Yes C50.919 Malignant neoplasm of unspecified site of unspecified female breast 11/27/2021 No Yes I50.42 Chronic combined systolic (congestive) and diastolic (congestive) heart failure 11/27/2021 No Yes G30.9 Alzheimer's disease, unspecified 11/27/2021 No Yes C54.1 Malignant neoplasm of endometrium 11/27/2021 No Yes Inactive Problems Resolved Problems Electronic Signature(s) Signed: 12/31/2021 11:19:45 AM By: [name] Shan DO Entered By: [name] on 12/31/2021 11:15:40 -------------------------------------------------------------------------------- Progress Note Details Patient Name: Date of Service: [name], [name] 12/31/2021  10:15 A [name] Medical Record Number: [id number] Patient Account Number: [id number] Date of Birth/Sex: Treating RN: 01-22-1925 (88 y.o. F) Primary Care Provider: Marda [name] Other Clinician: Referring Provider: Treating Provider/Extender: [name] in Treatment: 4 Subjective Chief Complaint Information obtained from Patient Left lower extremity wound History of Present Illness (HPI) this is a patient we know from several prior wounds on her bilateral lower extremities. She has venous stasis physiology, inflammation and hypertension. She is been fully evaluated for venous ablation and was found not to be a candidate. She does not have significant arterial disease. 02/14/15 the wound itself on her lateral left leg did not look too bad however there was surrounding maceration which was concerning 02/27/15 the wound itself is small with surrounding circumferential epithelialization there is no surrounding maceration. 03/06/15 only a very small area remains. I think this is mostly epithelialized however I would be uncomfortable not dressing this this week 03/13/15 the area is epithelialized. There was a thick surface on this. I took a #15 blade then lightly disrupted it but there does not appear to be any open area I did not see anything but appropriate epithelium Readmission: 08/16/18 on evaluation today patient presents for initial evaluation and our clinic released readmission although she has not been seen since April 2016. Nonetheless she is having issues with the ulcers currently on the bilateral lateral malleolus or locations as well as the left lateral lower extremity. These have been present for roughly 3 [name] since the patient actually developed significant bilateral lower extremity edema which patient's daughter states was roughly 3 times the size of her legs currently. Subsequently she ended up in the emerging department due to congestive heart failure. They  were able to get the swelling under control and fortunately she seems to be doing much better at this point in time. She was placed in an [employer] which she sat on for week part evaluation today as well. Fortunately the wound areas actually seem to be doing fairly well there is some macerated skin surrounding there are the areas/surface of the wound which are gonna require some debridement at this point. No fevers, chills, nausea, or vomiting noted at this time. Patient has a history of hypertension, mild peripheral vascular disease, chronic venous stasis, and other than the ulcer seems to be doing fairly well today. No fevers, chills, nausea, or vomiting noted at this time. 08/23/18 on evaluation today patient actually appears to be doing great in regard to her bilateral lower should be ulcers. She's shown [name] of improvement even just with one week with the [employer]. In general I'[name] actually very pleased with how things appear at this point. 09/06/18 on evaluation today patient actually appears to be doing much better her left lower extremity is completely healed. Go to the right lower  extremity lateral malleolus ulcer this still seems to be showing [name] of being open. There does not appear to be evidence of infection which is good news. This did require some sharp debridement however to remove some of the necrotic tissue/eschar on the surface of the wound Readmission 11/27/2021 Ms. Izabellah Dadisman is a 88 year old female with a past medical history of endometrial and breast cancer, chronic venous insufficiency, dementia and essential hypertension that presents to the clinic with a 1 month history of nonhealing ulcer to the left lower extremity. She states that the ulcer opened 1 month ago and then healed but has reopened again in the past week. She is currently keeping the area covered. She has a history of wounds to her lower extremities. She has compression stockings that she uses  daily. She currently denies [name] of infection. 1/13; patient presents for follow-up. She followed up for nurse visit for wrap change. She reports more odor. She denies pain. 1/20; patient presents for follow-up. She has been using gentamicin ointment with calcium alginate to the wound bed. She reports improvement in odor and drainage. She currently denies systemic [name] of infection. 1/27; patient presents for follow-up. She has been using gentamicin ointment with calcium alginate. She received her Keystone antibiotic 2 days ago. She brought this in. Home health has also been established. They will come out for the first time next week. She currently denies [name] of infection. She has no issues or complaints today. 2/2; patient presents for follow-up. She has been using Keystone antibiotics with [facility] with no issues. She denies [name] of infection. She has no issues or complaints today. 2/9; patient presents for follow-up. She has been using Keystone antibiotics with [facility] with no issues. She has tolerated the compression wrap well. She denies [name] of infection. Patient History Information obtained from Patient. Family History Cancer - Child, Hypertension - Child,Father, Kidney Disease - Child, Stroke - Siblings,Child, Thyroid Problems - Child, No family history of Diabetes, Heart Disease, Hereditary Spherocytosis, Lung Disease, Seizures, Tuberculosis. Social History Never smoker, Marital Status - Widowed, Alcohol Use - Never, Drug Use - No History, Caffeine Use - Never. Medical History Eyes Denies history of Cataracts, Glaucoma, Optic Neuritis Ear/Nose/Mouth/Throat Denies history of Chronic sinus problems/congestion, Middle ear problems Hematologic/Lymphatic Patient has history of Anemia Denies history of Hemophilia, Human Immunodeficiency Virus, Lymphedema, Sickle Cell Disease Respiratory Denies history of Aspiration, Asthma, Chronic Obstructive Pulmonary Disease  (COPD), Pneumothorax, Sleep Apnea, Tuberculosis Cardiovascular Patient has history of Congestive Heart Failure, Hypertension, Peripheral Venous Disease Denies history of Angina, Arrhythmia, Coronary Artery Disease, Hypotension, Myocardial Infarction, Peripheral Arterial Disease, Phlebitis, Vasculitis Gastrointestinal Denies history of Cirrhosis , Colitis, Crohnoos, Hepatitis A, Hepatitis B, Hepatitis C Endocrine Denies history of Type I Diabetes, Type II Diabetes Genitourinary Denies history of End Stage Renal Disease Immunological Denies history of Lupus Erythematosus, Raynaudoos, Scleroderma Integumentary (Skin) Denies history of History of Burn Musculoskeletal Patient has history of Osteoarthritis Denies history of Gout, Rheumatoid Arthritis, Osteomyelitis Neurologic Denies history of Neuropathy, Quadriplegia, Paraplegia, Seizure Disorder Oncologic Patient has history of Received Chemotherapy - Breast and Ovarian Cancer Denies history of Received Radiation Hospitalization/Surgery History - diverticulits. Objective Constitutional respirations regular, non-labored and within target range for patient.. Vitals Time Taken: 10:27 AM, Height: 66 in, Weight: 176 lbs, BMI: 28.4, Temperature: 98.4 F, Pulse: 74 bpm, Respiratory Rate: 20 breaths/min, Blood Pressure: 150/67 mmHg. Cardiovascular 2+ dorsalis pedis/posterior tibialis pulses. Psychiatric pleasant and cooperative. General Notes: Left lower extremity: T the distal lateral aspect there is  an open wound with granulation tissue and nonviable tissue. Appears well-healing. No o [name] of surrounding infection. Integumentary (Hair, Skin) Wound #26 status is Open. Original cause of wound was Gradually Appeared. The date acquired was: 11/20/2021. The wound has been in treatment 4 [name]. The wound is located on the Left,Lateral Ankle. The wound measures 4.3cm length x 3.9cm width x 0.1cm depth; 13.171cm^2 area and 1.317cm^3  volume. There is Fat Layer (Subcutaneous Tissue) exposed. There is no tunneling or undermining noted. There is a medium amount of serosanguineous drainage noted. The wound margin is distinct with the outline attached to the wound base. There is large (67-100%) red granulation within the wound bed. There is a small (1-33%) amount of necrotic tissue within the wound bed including Adherent Slough. Assessment Active Problems ICD-10 Chronic venous hypertension (idiopathic) with ulcer of left lower extremity Non-pressure chronic ulcer of other part of left lower leg with fat layer exposed Malignant neoplasm of unspecified site of unspecified female breast Chronic combined systolic (congestive) and diastolic (congestive) heart failure Alzheimer's disease, unspecified Malignant neoplasm of endometrium Patient's wound has shown improvement in size and appearance since last clinic visit. No [name] of surrounding infection. I debrided nonviable tissue. I recommended Continuing with Keystone antibiotics and Hydrofera Blue under compression therapy. Follow-up in 1 week. Procedures Wound #26 Pre-procedure diagnosis of Wound #26 is a Venous Leg Ulcer located on the Left,Lateral Ankle .Severity of Tissue Pre Debridement is: Fat layer exposed. There was a Excisional Skin/Subcutaneous Tissue Debridement with a total area of 16.77 sq cm performed by [name] Shan, DO. With the following instrument(s): Curette to remove Viable and Non-Viable tissue/material. Material removed includes Subcutaneous Tissue, Slough, Skin: Dermis, Skin: Epidermis, and Fibrin/Exudate after achieving pain control using Lidocaine 5% topical ointment. A time out was conducted at 10:48, prior to the start of the procedure. A Minimum amount of bleeding was controlled with Pressure. The procedure was tolerated well with a pain level of 0 throughout and a pain level of 0 following the procedure. Post Debridement Measurements: 4.3cm length x  3.9cm width x 0.1cm depth; 1.317cm^3 volume. Character of Wound/Ulcer Post Debridement is improved. Severity of Tissue Post Debridement is: Fat layer exposed. Post procedure Diagnosis Wound #26: Same as Pre-Procedure Pre-procedure diagnosis of Wound #26 is a Venous Leg Ulcer located on the Left,Lateral Ankle . There was a Three Layer Compression Therapy Procedure by [name] Pilling, RN. Post procedure Diagnosis Wound #26: Same as Pre-Procedure Plan Follow-up Appointments: Return Appointment in 1 week. - Dr. Heber [place] and Tammi Klippel, RN ***Continue to bring keystone topical antibiotic medication to each visit.*** Bathing/ Shower/ Hygiene: May shower with protection but do not get wound dressing(s) wet. - use cast protector. May shower and wash wound with soap and water. - with dressing changes may wash with soap and water. Edema Control - Lymphedema / SCD / Other: Elevate legs to the level of the heart or above for 30 minutes daily and/or when sitting, a frequency of: Avoid standing for long periods of time. Patient to wear own compression stockings every day. - apply in the morning and remove at night to right leg. Exercise regularly Moisturize legs daily. - every night before day right leg. Additional Orders / Instructions: Follow Nutritious Diet - High Protein Diet Home Health: No change in wound care orders this week; continue Home Health for wound care. May utilize formulary equivalent dressing for wound treatment orders unless otherwise specified. - Home Health for skilled wound care nursing once a week and wound  center weekly. Apply keystone topical antibiotics and hydrofera blue classic as primary dressing. Other Home Health Orders/Instructions: [facility] home health WOUND #26: - Ankle Wound Laterality: Left, Lateral Cleanser: Soap and Water (Home Health) 2 x Per Week/30 Days Discharge Instructions: May shower and wash wound with dial antibacterial soap and water prior to dressing  change. Peri-Wound Care: Sween Lotion (Moisturizing lotion) (Home Health) 2 x Per Week/30 Days Discharge Instructions: Apply moisturizing lotion as directed Topical: Keystone topica Antibiotics (Home Health) 2 x Per Week/30 Days Discharge Instructions: Mix medication according to pharmacy instruction apply to wound bed. Prim Dressing: Hydrofera Blue Classic Foam, 4x4 in (Home Health) 2 x Per Week/30 Days ary Discharge Instructions: Moisten with saline apply over the [facility] medication. Secondary Dressing: Zetuvit Plus 4x8 in [facility]) 2 x Per Week/30 Days Discharge Instructions: Apply over primary dressing as directed. Com pression Wrap: ThreePress (3 layer compression wrap) (Home Health) 2 x Per Week/30 Days Discharge Instructions: Apply three layer compression as directed. 1. In office sharp debridement 2. Keystone antibiotic with Hydrofera Blue under 3 layer compression 3. Follow-up in 1 week Electronic Signature(s) Signed: 12/31/2021 11:19:45 AM By: [name] Shan DO Entered By: [name] on 12/31/2021 11:18:59 -------------------------------------------------------------------------------- HxROS Details Patient Name: Date of Service: [name], [name] 12/31/2021 10:15 A [name] Medical Record Number: [id number] Patient Account Number: [id number] Date of Birth/Sex: Treating RN: [birth year] (88 y.o. F) Primary Care Provider: Marda [name] Other Clinician: Referring Provider: Treating Provider/Extender: [name] in Treatment: 4 Information Obtained From Patient Eyes Medical History: Negative for: Cataracts; Glaucoma; Optic Neuritis Ear/Nose/Mouth/Throat Medical History: Negative for: Chronic sinus problems/congestion; Middle ear problems Hematologic/Lymphatic Medical History: Positive for: Anemia Negative for: Hemophilia; Human Immunodeficiency Virus; Lymphedema; Sickle Cell Disease Respiratory Medical History: Negative for:  Aspiration; Asthma; Chronic Obstructive Pulmonary Disease (COPD); Pneumothorax; Sleep Apnea; Tuberculosis Cardiovascular Medical History: Positive for: Congestive Heart Failure; Hypertension; Peripheral Venous Disease Negative for: Angina; Arrhythmia; Coronary Artery Disease; Hypotension; Myocardial Infarction; Peripheral Arterial Disease; Phlebitis; Vasculitis Gastrointestinal Medical History: Negative for: Cirrhosis ; Colitis; Crohns; Hepatitis A; Hepatitis B; Hepatitis C Endocrine Medical History: Negative for: Type I Diabetes; Type II Diabetes Genitourinary Medical History: Negative for: End Stage Renal Disease Immunological Medical History: Negative for: Lupus Erythematosus; Raynauds; Scleroderma Integumentary (Skin) Medical History: Negative for: History of Burn Musculoskeletal Medical History: Positive for: Osteoarthritis Negative for: Gout; Rheumatoid Arthritis; Osteomyelitis Neurologic Medical History: Negative for: Neuropathy; Quadriplegia; Paraplegia; Seizure Disorder Oncologic Medical History: Positive for: Received Chemotherapy - Breast and Ovarian Cancer Negative for: Received Radiation Immunizations Pneumococcal Vaccine: Received Pneumococcal Vaccination: Yes Received Pneumococcal Vaccination On or After 60th Birthday: Yes Implantable Devices None Hospitalization / Surgery History Type of Hospitalization/Surgery diverticulits Family and Social History Cancer: Yes - Child; Diabetes: No; Heart Disease: No; Hereditary Spherocytosis: No; Hypertension: Yes - Child,Father; Kidney Disease: Yes - Child; Lung Disease: No; Seizures: No; Stroke: Yes - Siblings,Child; Thyroid Problems: Yes - Child; Tuberculosis: No; Never smoker; Marital Status - Widowed; Alcohol Use: Never; Drug Use: No History; Caffeine Use: Never; Financial Concerns: No; Food, Clothing or Shelter Needs: No; Support System Lacking: No; Transportation Concerns: No Electronic Signature(s) Signed:  12/31/2021 11:19:45 AM By: [name] Shan DO Entered By: [name] on 12/31/2021 11:16:54 -------------------------------------------------------------------------------- SuperBill Details Patient Name: Date of Service: [name], MANNY [name] 12/31/2021 Medical Record Number: [id number] Patient Account Number: [id number] Date of Birth/Sex: Treating RN: Aug 15, 1925 (88 y.o. [name], Tammi Klippel Primary Care Provider: Marda [name] Other Clinician: Referring Provider: Treating Provider/Extender: [name] in  Treatment: 4 Diagnosis Coding ICD-10 Codes Code Description I87.312 Chronic venous hypertension (idiopathic) with ulcer of left lower extremity L97.822 Non-pressure chronic ulcer of other part of left lower leg with fat layer exposed C50.919 Malignant neoplasm of unspecified site of unspecified female breast I50.42 Chronic combined systolic (congestive) and diastolic (congestive) heart failure G30.9 Alzheimer's disease, unspecified C54.1 Malignant neoplasm of endometrium Facility Procedures Physician Procedures : CPT4 Code Description Modifier [id number] - WC PHYS SUBQ TISS 20 SQ CM ICD-10 Diagnosis Description L97.822 Non-pressure chronic ulcer of other part of left lower leg with fat layer exposed Quantity: 1 Electronic Signature(s) Signed: 12/31/2021 11:19:45 AM By: [name] Shan DO Entered By: [name] on 12/31/2021 11:19:10

## 2021-12-31 NOTE — Progress Notes (Signed)
[name], [name] ([id number]) Visit Report for 12/31/2021 Arrival Information Details Patient Name: Date of Service: [name], [name] 12/31/2021 10:15 A M Medical Record Number: [id number] Patient Account Number: [id number] Date of Birth/Sex: Treating RN: 01-20-1925 (88 y.o. [name], Tammi Klippel Primary Care Hajra Port: Marda Stalker Other Clinician: Referring Quintavius Niebuhr: Treating Kiahna Banghart/Extender: Aviva Signs in Treatment: 4 Visit Information History Since Last Visit Added or deleted any medications: No Patient Arrived: Wheel Chair Any new allergies or adverse reactions: No Arrival Time: 10:27 Had a fall or experienced change in No Accompanied By: daughter activities of daily living that may affect Transfer Assistance: None risk of falls: Patient Identification Verified: Yes Signs or symptoms of abuse/neglect since last visito No Secondary Verification Process Completed: Yes Hospitalized since last visit: No Patient Requires Transmission-Based Precautions: No Implantable device outside of the clinic excluding No Patient Has Alerts: No cellular tissue based products placed in the center since last visit: Has Dressing in Place as Prescribed: Yes Has Compression in Place as Prescribed: Yes Pain Present Now: No Electronic Signature(s) Signed: 12/31/2021 5:47:56 PM By: Deon Pilling RN, BSN Entered By: Deon Pilling on 12/31/2021 10:27:45 -------------------------------------------------------------------------------- Compression Therapy Details Patient Name: Date of Service: [place], [place] 12/31/2021 10:15 A M Medical Record Number: [id number] Patient Account Number: [id number] Date of Birth/Sex: Treating RN: [birth year] (88 y.o. [name] Primary Care Verlene Glantz: Marda Stalker Other Clinician: Referring Deiona Hooper: Treating Kimiah Hibner/Extender: Marjo Bicker Weeks in Treatment: 4 Compression Therapy Performed for Wound  Assessment: Wound #26 Left,Lateral Ankle Performed By: Clinician Deon Pilling, RN Compression Type: Three Layer Post Procedure Diagnosis Same as Pre-procedure Electronic Signature(s) Signed: 12/31/2021 5:47:56 PM By: Deon Pilling RN, BSN Entered By: Deon Pilling on 12/31/2021 10:51:32 -------------------------------------------------------------------------------- Encounter Discharge Information Details Patient Name: Date of Service: [name], [name] 12/31/2021 10:15 A M Medical Record Number: [id number] Patient Account Number: [id number] Date of Birth/Sex: Treating RN: 06-Feb-1925 (88 y.o. [name], Tammi Klippel Primary Care Maryum Batterson: Marda Stalker Other Clinician: Referring Urban Naval: Treating Bentlie Withem/Extender: Aviva Signs in Treatment: 4 Encounter Discharge Information Items Post Procedure Vitals Discharge Condition: Stable Temperature (F): 98.4 Ambulatory Status: Wheelchair Pulse (bpm): 74 Discharge Destination: Home Respiratory Rate (breaths/min): 20 Transportation: Private Auto Blood Pressure (mmHg): 150/67 Accompanied By: daughter Schedule Follow-up Appointment: Yes Clinical Summary of Care: Electronic Signature(s) Signed: 12/31/2021 5:47:56 PM By: Deon Pilling RN, BSN Entered By: Deon Pilling on 12/31/2021 10:56:58 -------------------------------------------------------------------------------- Lower Extremity Assessment Details Patient Name: Date of Service: [name], [name] 12/31/2021 10:15 A M Medical Record Number: [id number] Patient Account Number: [id number] Date of Birth/Sex: Treating RN: Oct 27, 1925 (88 y.o. [name] Primary Care Osiel Stick: Marda Stalker Other Clinician: Referring Yalissa Fink: Treating Beverlie Kurihara/Extender: Marjo Bicker Weeks in Treatment: 4 Edema Assessment Assessed: [Left: Yes] [Right: No] Edema: [Left: N] [Right: o] Calf Left: Right: Point of Measurement: 30 cm From Medial  Instep 30 cm Ankle Left: Right: Point of Measurement: 11 cm From Medial Instep 21 cm Vascular Assessment Pulses: Dorsalis Pedis Palpable: [Left:Yes] Electronic Signature(s) Signed: 12/31/2021 5:47:56 PM By: Deon Pilling RN, BSN Entered By: Deon Pilling on 12/31/2021 10:33:49 -------------------------------------------------------------------------------- Multi Wound Chart Details Patient Name: Date of Service: [name], [name] 12/31/2021 10:15 A M Medical Record Number: [id number] Patient Account Number: [id number] Date of Birth/Sex: Treating RN: 07/27/1925 (88 y.o. F) Primary Care Charnese Federici: Marda Stalker Other Clinician: Referring Buffie Herne: Treating Lareta Bruneau/Extender: Marjo Bicker Weeks in Treatment: 4 Vital Signs Height(in): 66 Pulse(bpm): 74 Weight(lbs): 176 Blood Pressure(mmHg): 150/67 Body Mass Index(BMI): 28.4  Temperature(F): 98.4 Respiratory Rate(breaths/min): 20 Photos: [N/A:N/A] Left, Lateral Ankle N/A N/A Wound Location: Gradually Appeared N/A N/A Wounding Event: Venous Leg Ulcer N/A N/A Primary Etiology: Anemia, Congestive Heart Failure, N/A N/A Comorbid History: Hypertension, Peripheral Venous Disease, Osteoarthritis, Received Chemotherapy 11/20/2021 N/A N/A Date Acquired: 4 N/A N/A Weeks of Treatment: Open N/A N/A Wound Status: No N/A N/A Wound Recurrence: 4.3x3.9x0.1 N/A N/A Measurements L x W x D (cm) 13.171 N/A N/A A (cm) : rea 1.317 N/A N/A Volume (cm) : -39.70% N/A N/A % Reduction in A rea: -39.80% N/A N/A % Reduction in Volume: Full Thickness Without Exposed N/A N/A Classification: Support Structures Medium N/A N/A Exudate A mount: Serosanguineous N/A N/A Exudate Type: red, brown N/A N/A Exudate Color: Distinct, outline attached N/A N/A Wound Margin: Large (67-100%) N/A N/A Granulation A mount: Red N/A N/A Granulation Quality: Small (1-33%) N/A N/A Necrotic A mount: Fat Layer (Subcutaneous  Tissue): Yes N/A N/A Exposed Structures: Fascia: No Tendon: No Muscle: No Joint: No Bone: No Small (1-33%) N/A N/A Epithelialization: Debridement - Excisional N/A N/A Debridement: Pre-procedure Verification/Time Out 10:48 N/A N/A Taken: Lidocaine 5% topical ointment N/A N/A Pain Control: Subcutaneous, Slough N/A N/A Tissue Debrided: Skin/Subcutaneous Tissue N/A N/A Level: 16.77 N/A N/A Debridement A (sq cm): rea Curette N/A N/A Instrument: Minimum N/A N/A Bleeding: Pressure N/A N/A Hemostasis A chieved: 0 N/A N/A Procedural Pain: 0 N/A N/A Post Procedural Pain: Procedure was tolerated well N/A N/A Debridement Treatment Response: 4.3x3.9x0.1 N/A N/A Post Debridement Measurements L x W x D (cm) 1.317 N/A N/A Post Debridement Volume: (cm) Compression Therapy N/A N/A Procedures Performed: Debridement Treatment Notes Wound #26 (Ankle) Wound Laterality: Left, Lateral Cleanser Soap and Water Discharge Instruction: May shower and wash wound with dial antibacterial soap and water prior to dressing change. Peri-Wound Care Sween Lotion (Moisturizing lotion) Discharge Instruction: Apply moisturizing lotion as directed Topical Keystone topica Antibiotics Discharge Instruction: Mix medication according to pharmacy instruction apply to wound bed. Primary Dressing Hydrofera Blue Classic Foam, 4x4 in Discharge Instruction: Moisten with saline apply over the [facility] medication. Secondary Dressing Zetuvit Plus 4x8 in Discharge Instruction: Apply over primary dressing as directed. Secured With Compression Wrap ThreePress (3 layer compression wrap) Discharge Instruction: Apply three layer compression as directed. Compression Stockings Add-Ons Electronic Signature(s) Signed: 12/31/2021 11:19:45 AM By: Kalman Shan DO Entered By: Kalman Shan on 12/31/2021 11:15:51 -------------------------------------------------------------------------------- Multi-Disciplinary  Care Plan Details Patient Name: Date of Service: [name], [name] 12/31/2021 10:15 A M Medical Record Number: [id number] Patient Account Number: [id number] Date of Birth/Sex: Treating RN: March 10, 1925 (88 y.o. [name], Tammi Klippel Primary Care Isac Lincks: Marda Stalker Other Clinician: Referring Haynes Giannotti: Treating Zuhair Lariccia/Extender: Aviva Signs in Treatment: 4 Active Inactive Nutrition Nursing Diagnoses: Imbalanced nutrition Goals: Patient/caregiver agrees to and verbalizes understanding of need to use nutritional supplements and/or vitamins as prescribed Date Initiated: 11/27/2021 Target Resolution Date: 01/22/2022 Goal Status: Active Interventions: Assess patient nutrition upon admission and as needed per policy Provide education on nutrition Treatment Activities: Education provided on Nutrition : 11/27/2021 Notes: Wound/Skin Impairment Nursing Diagnoses: Impaired tissue integrity Goals: Patient/caregiver will verbalize understanding of skin care regimen Date Initiated: 11/27/2021 Target Resolution Date: 01/22/2022 Goal Status: Active Ulcer/skin breakdown will have a volume reduction of 30% by week 4 Date Initiated: 11/27/2021 Date Inactivated: 12/31/2021 Target Resolution Date: 12/25/2021 Unmet Reason: according to Goal Status: Unmet measurements has not reduced. Interventions: Assess patient/caregiver ability to obtain necessary supplies Assess patient/caregiver ability to perform ulcer/skin care regimen upon admission and as needed  Assess ulceration(s) every visit Provide education on ulcer and skin care Treatment Activities: Topical wound management initiated : 11/27/2021 Notes: Electronic Signature(s) Signed: 12/31/2021 5:47:56 PM By: Deon Pilling RN, BSN Entered By: Deon Pilling on 12/31/2021 10:50:30 -------------------------------------------------------------------------------- Pain Assessment Details Patient Name: Date of Service: [name], [name]  [name] 12/31/2021 10:15 A M Medical Record Number: [id number] Patient Account Number: [id number] Date of Birth/Sex: Treating RN: [birth year] (88 y.o. [name] Primary Care Zeynep Fantroy: Marda Stalker Other Clinician: Referring Darion Milewski: Treating Shaquitta Burbridge/Extender: Marjo Bicker Weeks in Treatment: 4 Active Problems Location of Pain Severity and Description of Pain Patient Has Paino No Site Locations Rate the pain. Current Pain Level: 0 Pain Management and Medication Current Pain Management: Medication: No Cold Application: No Rest: No Massage: No Activity: No T.E.N.S.: No Heat Application: No Leg drop or elevation: No Is the Current Pain Management Adequate: Adequate How does your wound impact your activities of daily livingo Sleep: No Bathing: No Appetite: No Relationship With Others: No Bladder Continence: No Emotions: No Bowel Continence: No Work: No Toileting: No Drive: No Dressing: No Hobbies: No Engineer, maintenance) Signed: 12/31/2021 5:47:56 PM By: Deon Pilling RN, BSN Entered By: Deon Pilling on 12/31/2021 10:28:15 -------------------------------------------------------------------------------- Patient/Caregiver Education Details Patient Name: Date of Service: [name] 2/9/2023andnbsp10:15 A M Medical Record Number: [id number] Patient Account Number: [id number] Date of Birth/Gender: Treating RN: 12-27-1924 (88 y.o. [name] Primary Care Physician: Marda Stalker Other Clinician: Referring Physician: Treating Physician/Extender: Aviva Signs in Treatment: 4 Education Assessment Education Provided To: Patient and Caregiver Education Topics Provided Nutrition: Handouts: Nutrition Methods: Explain/Verbal Responses: Reinforcements needed Electronic Signature(s) Signed: 12/31/2021 5:47:56 PM By: Deon Pilling RN, BSN Entered By: Deon Pilling on 12/31/2021  10:50:58 -------------------------------------------------------------------------------- Wound Assessment Details Patient Name: Date of Service: [name], [name] 12/31/2021 10:15 A M Medical Record Number: [id number] Patient Account Number: [id number] Date of Birth/Sex: Treating RN: 02-Aug-1925 (88 y.o. [name], Meta.Reding Primary Care Christain Mcraney: Marda Stalker Other Clinician: Referring Elysha Daw: Treating Linkon Siverson/Extender: Marjo Bicker Weeks in Treatment: 4 Wound Status Wound Number: 26 Primary Venous Leg Ulcer Etiology: Wound Location: Left, Lateral Ankle Wound Open Wounding Event: Gradually Appeared Status: Date Acquired: 11/20/2021 Comorbid Anemia, Congestive Heart Failure, Hypertension, Peripheral Weeks Of Treatment: 4 History: Venous Disease, Osteoarthritis, Received Chemotherapy Clustered Wound: No Photos Wound Measurements Length: (cm) 4.3 Width: (cm) 3.9 Depth: (cm) 0.1 Area: (cm) 13.171 Volume: (cm) 1.317 % Reduction in Area: -39.7% % Reduction in Volume: -39.8% Epithelialization: Small (1-33%) Tunneling: No Undermining: No Wound Description Classification: Full Thickness Without Exposed Support Structures Wound Margin: Distinct, outline attached Exudate Amount: Medium Exudate Type: Serosanguineous Exudate Color: red, brown Foul Odor After Cleansing: No Slough/Fibrino Yes Wound Bed Granulation Amount: Large (67-100%) Exposed Structure Granulation Quality: Red Fascia Exposed: No Necrotic Amount: Small (1-33%) Fat Layer (Subcutaneous Tissue) Exposed: Yes Necrotic Quality: Adherent Slough Tendon Exposed: No Muscle Exposed: No Joint Exposed: No Bone Exposed: No Treatment Notes Wound #26 (Ankle) Wound Laterality: Left, Lateral Cleanser Soap and Water Discharge Instruction: May shower and wash wound with dial antibacterial soap and water prior to dressing change. Peri-Wound Care Sween Lotion (Moisturizing lotion) Discharge  Instruction: Apply moisturizing lotion as directed Topical Keystone topica Antibiotics Discharge Instruction: Mix medication according to pharmacy instruction apply to wound bed. Primary Dressing Hydrofera Blue Classic Foam, 4x4 in Discharge Instruction: Moisten with saline apply over the [facility] medication. Secondary Dressing Zetuvit Plus 4x8 in Discharge Instruction: Apply over primary dressing as directed. Secured With Compression Wrap ThreePress (  3 layer compression wrap) Discharge Instruction: Apply three layer compression as directed. Compression Stockings Add-Ons Electronic Signature(s) Signed: 12/31/2021 5:47:56 PM By: Deon Pilling RN, BSN Entered By: Deon Pilling on 12/31/2021 10:39:26 -------------------------------------------------------------------------------- Vitals Details Patient Name: Date of Service: [name], [name] 12/31/2021 10:15 A M Medical Record Number: [id number] Patient Account Number: [id number] Date of Birth/Sex: Treating RN: [birth year] (88 y.o. [name], Tammi Klippel Primary Care Nyonna Hargrove: Marda Stalker Other Clinician: Referring Aerin Delany: Treating Kenta Laster/Extender: Marjo Bicker Weeks in Treatment: 4 Vital Signs Time Taken: 10:27 Temperature (F): 98.4 Height (in): 66 Pulse (bpm): 74 Weight (lbs): 176 Respiratory Rate (breaths/min): 20 Body Mass Index (BMI): 28.4 Blood Pressure (mmHg): 150/67 Reference Range: 80 - 120 mg / dl Electronic Signature(s) Signed: 12/31/2021 5:47:56 PM By: Deon Pilling RN, BSN Entered By: Deon Pilling on 12/31/2021 10:28:07

## 2022-01-01 NOTE — Progress Notes (Signed)
Subjective: [name] is a pleasant 88 y.o. female patient seen today for for at risk foot care. Patient has h/o PAD   Patient  presents with her daughter on today's visit.  Daughter states Mom did have breakdown on her left ankle again and she is being seen by Dr. Heber  in [place]. She does have a pressure spot on her left bunion area.   PCP is Marda Stalker, PA-C. Last visit was: 11/05/2021.  No Known Allergies  Objective: Physical Exam  General: [name] is a pleasant 88 y.o. African American female, thin build in NAD. AAO x 3.   Vascular:  CFT <3 seconds b/l LE. Palpable DP pulse(s) b/l LE. Diminished PT pulse(s) b/l LE. Pedal hair absent. No pain with calf compression b/l. Evidence of chronic venous insufficiency b/l LE.   Dermatological:  Area of pressure noted medial aspect left 1st metatarsal head. Skin remains intact and area is dime-size. [name] Pedal skin thin, shiny and atrophic b/l LE. No interdigital macerations noted b/l LE. Hyperkeratotic lesion(s) L hallux and L 2nd toe.  No erythema, no edema, no drainage, no fluctuance.   Musculoskeletal:  Normal muscle strength 5/5 to all lower extremity muscle groups bilaterally. HAV with bunion deformity noted b/l LE. Hammertoe(s) noted to the L 2nd toe.[name] No pain, crepitus or joint limitation noted with ROM b/l LE.  Patient ambulates independently without assistive aids.   Neurological:  Protective sensation intact 5/5 intact bilaterally with 10g monofilament b/l. Vibratory sensation intact b/l.   Assessment and Plan:  1. Pain due to onychomycosis of toenails of both feet   2. Corns   3. Pressure injury of left foot, stage 1   4. PAD (peripheral artery disease) ([place])      Patient was evaluated and treated and all questions answered. Consent given for treatment as described below: -Mycotic toenails 1-5 bilaterally were debrided in length and girth with sterile nail nippers and dremel without  incident. -Corn(s) L hallux and L 2nd toe pared utilizing sterile scalpel blade without complication or incident. Total number debrided=2. -Applied Biatain Silicone Pad for protection of pressure area left bunion area. Apply equivalent protective dressing to bunion area left foot daily. Continue postop shoe left foot until resolved. -Dispensed foam toe separator for 1st webspace left foot. Apply to 1st webspace every morning. Remove every evening. -Patient/POA to call should there be question/concern in the interim.  Return in about 3 months (around 03/24/2022).  Marzetta Board, DPM

## 2022-01-07 ENCOUNTER — Other Ambulatory Visit: Payer: Self-pay

## 2022-01-07 ENCOUNTER — Encounter (HOSPITAL_BASED_OUTPATIENT_CLINIC_OR_DEPARTMENT_OTHER): Payer: Medicare PPO | Admitting: Internal Medicine

## 2022-01-07 DIAGNOSIS — L97822 Non-pressure chronic ulcer of other part of left lower leg with fat layer exposed: Secondary | ICD-10-CM | POA: Diagnosis not present

## 2022-01-07 DIAGNOSIS — I110 Hypertensive heart disease with heart failure: Secondary | ICD-10-CM | POA: Diagnosis not present

## 2022-01-07 DIAGNOSIS — M1990 Unspecified osteoarthritis, unspecified site: Secondary | ICD-10-CM | POA: Diagnosis not present

## 2022-01-07 DIAGNOSIS — I739 Peripheral vascular disease, unspecified: Secondary | ICD-10-CM | POA: Diagnosis not present

## 2022-01-07 DIAGNOSIS — I5042 Chronic combined systolic (congestive) and diastolic (congestive) heart failure: Secondary | ICD-10-CM | POA: Diagnosis not present

## 2022-01-07 DIAGNOSIS — I872 Venous insufficiency (chronic) (peripheral): Secondary | ICD-10-CM | POA: Diagnosis not present

## 2022-01-07 NOTE — Progress Notes (Signed)
YOHANNA, [name] ([id number]) Visit Report for 01/07/2022 Arrival Information Details Patient Name: Date of Service: [name], [name] 01/07/2022 10:00 A M Medical Record Number: [id number] Patient Account Number: [id number] Date of Birth/Sex: Treating RN: December 06, 1924 (88 y.o. [name].Reding Primary Care [place]heton Viramontes: Marda Stalker Other Clinician: Referring Awais Cobarrubias: Treating Orlandria Kissner/Extender: Aviva Signs in Treatment: 5 Visit Information History Since Last Visit Added or deleted any medications: No Patient Arrived: Wheel Chair Any new allergies or adverse reactions: No Arrival Time: 10:22 Had a fall or experienced change in No Accompanied By: daughter activities of daily living that may affect Transfer Assistance: None risk of falls: Patient Identification Verified: Yes Signs or symptoms of abuse/neglect since last visito No Secondary Verification Process Completed: Yes Hospitalized since last visit: No Patient Requires Transmission-Based Precautions: No Implantable device outside of the clinic excluding No Patient Has Alerts: No cellular tissue based products placed in the center since last visit: Has Dressing in Place as Prescribed: Yes Has Compression in Place as Prescribed: Yes Pain Present Now: No Electronic Signature(s) Signed: 01/07/2022 4:02:17 PM By: Deon Pilling RN, BSN Entered By: Deon Pilling on 01/07/2022 10:24:13 -------------------------------------------------------------------------------- Compression Therapy Details Patient Name: Date of Service: [name], [name] 01/07/2022 10:00 A M Medical Record Number: [id number] Patient Account Number: [id number] Date of Birth/Sex: Treating RN: [birth year] (88 y.o. [name] Primary Care Nikita Humble: Marda Stalker Other Clinician: Referring Reneka Nebergall: Treating Durwin Davisson/Extender: Marjo Bicker Weeks in Treatment: 5 Compression Therapy Performed for  Wound Assessment: Wound #26 Left,Lateral Ankle Performed By: Clinician Rhae Hammock, RN Compression Type: Three Layer Post Procedure Diagnosis Same as Pre-procedure Electronic Signature(s) Signed: 01/07/2022 5:08:54 PM By: [name] Entered By: Rhae Hammock on 01/07/2022 10:52:03 -------------------------------------------------------------------------------- Encounter Discharge Information Details Patient Name: Date of Service: [name], FIORENZA [name] 01/07/2022 10:00 A M Medical Record Number: [id number] Patient Account Number: [id number] Date of Birth/Sex: Treating RN: 1924/12/04 (88 y.o. [name] Primary Care Kaori Jumper: Marda Stalker Other Clinician: Referring Jacson Rapaport: Treating Malakie Balis/Extender: Aviva Signs in Treatment: 5 Encounter Discharge Information Items Post Procedure Vitals Discharge Condition: Stable Temperature (F): 98.7 Ambulatory Status: Wheelchair Pulse (bpm): 74 Discharge Destination: Home Respiratory Rate (breaths/min): 17 Transportation: Private Auto Blood Pressure (mmHg): 134/74 Accompanied By: Caren Macadam Schedule Follow-up Appointment: Yes Clinical Summary of Care: Patient Declined Electronic Signature(s) Signed: 01/07/2022 5:08:54 PM By: [name] Entered By: Rhae Hammock on 01/07/2022 11:09:55 -------------------------------------------------------------------------------- Lower Extremity Assessment Details Patient Name: Date of Service: [name], [name] 01/07/2022 10:00 A M Medical Record Number: [id number] Patient Account Number: [id number] Date of Birth/Sex: Treating RN: 01/03/1925 (88 y.o. [name] Primary Care Carissa Musick: Marda Stalker Other Clinician: Referring Delayni Streed: Treating Leonila Speranza/Extender: Marjo Bicker Weeks in Treatment: 5 Edema Assessment Assessed: [Left: No] [Right: No] Edema: [Left: N] [Right: o] Calf Left:  Right: Point of Measurement: 30 cm From Medial Instep 30 cm Ankle Left: Right: Point of Measurement: 11 cm From Medial Instep 20.5 cm Vascular Assessment Pulses: Dorsalis Pedis Palpable: [Left:Yes] Posterior Tibial Palpable: [Left:Yes] Electronic Signature(s) Signed: 01/07/2022 5:08:54 PM By: [name] Entered By: Rhae Hammock on 01/07/2022 10:36:33 -------------------------------------------------------------------------------- Multi Wound Chart Details Patient Name: Date of Service: [name], [name] 01/07/2022 10:00 A M Medical Record Number: [id number] Patient Account Number: [id number] Date of Birth/Sex: Treating RN: 1925-01-14 (88 y.o. F) Primary Care Maxximus Gotay: Marda Stalker Other Clinician: Referring Arbie Reisz: Treating Barclay Lennox/Extender: Marjo Bicker Weeks in Treatment: 5 Vital Signs Height(in): 66 Pulse(bpm): 73 Weight(lbs): 176 Blood Pressure(mmHg): 121/68 Body  Mass Index(BMI): 28.4 Temperature(F): 98 Respiratory Rate(breaths/min): 20 Photos: [N/A:N/A] Left, Lateral Ankle N/A N/A Wound Location: Gradually Appeared N/A N/A Wounding Event: Venous Leg Ulcer N/A N/A Primary Etiology: Anemia, Congestive Heart Failure, N/A N/A Comorbid History: Hypertension, Peripheral Venous Disease, Osteoarthritis, Received Chemotherapy 11/20/2021 N/A N/A Date Acquired: 5 N/A N/A Weeks of Treatment: Open N/A N/A Wound Status: No N/A N/A Wound Recurrence: 4.1x3.7x0.2 N/A N/A Measurements L x W x D (cm) 11.914 N/A N/A A (cm) : rea 2.383 N/A N/A Volume (cm) : -26.40% N/A N/A % Reduction in A rea: -153.00% N/A N/A % Reduction in Volume: Full Thickness Without Exposed N/A N/A Classification: Support Structures Medium N/A N/A Exudate A mount: Serosanguineous N/A N/A Exudate Type: red, brown N/A N/A Exudate Color: Distinct, outline attached N/A N/A Wound Margin: Large (67-100%) N/A N/A Granulation A mount: Red  N/A N/A Granulation Quality: Small (1-33%) N/A N/A Necrotic A mount: Fat Layer (Subcutaneous Tissue): Yes N/A N/A Exposed Structures: Fascia: No Tendon: No Muscle: No Joint: No Bone: No Small (1-33%) N/A N/A Epithelialization: Debridement - Excisional N/A N/A Debridement: Pre-procedure Verification/Time Out 10:45 N/A N/A Taken: Lidocaine N/A N/A Pain Control: Subcutaneous, Slough N/A N/A Tissue Debrided: Skin/Subcutaneous Tissue N/A N/A Level: 15.17 N/A N/A Debridement A (sq cm): rea Curette N/A N/A Instrument: Minimum N/A N/A Bleeding: Pressure N/A N/A Hemostasis A chieved: 0 N/A N/A Procedural Pain: 0 N/A N/A Post Procedural Pain: Procedure was tolerated well N/A N/A Debridement Treatment Response: 4.1x3.7x0.2 N/A N/A Post Debridement Measurements L x W x D (cm) 2.383 N/A N/A Post Debridement Volume: (cm) Compression Therapy N/A N/A Procedures Performed: Debridement Treatment Notes Wound #26 (Ankle) Wound Laterality: Left, Lateral Cleanser Soap and Water Discharge Instruction: May shower and wash wound with dial antibacterial soap and water prior to dressing change. Peri-Wound Care Sween Lotion (Moisturizing lotion) Discharge Instruction: Apply moisturizing lotion as directed Topical Keystone topica Antibiotics Discharge Instruction: Mix medication according to pharmacy instruction apply to wound bed. Primary Dressing Hydrofera Blue Classic Foam, 4x4 in Discharge Instruction: Moisten with saline apply over the [facility] medication. Secondary Dressing Zetuvit Plus 4x8 in Discharge Instruction: Apply over primary dressing as directed. Secured With Compression Wrap ThreePress (3 layer compression wrap) Discharge Instruction: Apply three layer compression as directed. Compression Stockings Add-Ons Electronic Signature(s) Signed: 01/07/2022 11:42:09 AM By: Kalman Shan DO Entered By: Kalman Shan on 01/07/2022  11:13:17 -------------------------------------------------------------------------------- Multi-Disciplinary Care Plan Details Patient Name: Date of Service: EWA, HIPP [name] 01/07/2022 10:00 A M Medical Record Number: [id number] Patient Account Number: [id number] Date of Birth/Sex: Treating RN: 12/13/1924 (88 y.o. [name] Primary Care Amy Gothard: Marda Stalker Other Clinician: Referring Wetona Viramontes: Treating Lyris Hitchman/Extender: Aviva Signs in Treatment: 5 Active Inactive Nutrition Nursing Diagnoses: Imbalanced nutrition Goals: Patient/caregiver agrees to and verbalizes understanding of need to use nutritional supplements and/or vitamins as prescribed Date Initiated: 11/27/2021 Target Resolution Date: 01/22/2022 Goal Status: Active Interventions: Assess patient nutrition upon admission and as needed per policy Provide education on nutrition Treatment Activities: Education provided on Nutrition : 12/31/2021 Notes: Wound/Skin Impairment Nursing Diagnoses: Impaired tissue integrity Goals: Patient/caregiver will verbalize understanding of skin care regimen Date Initiated: 11/27/2021 Target Resolution Date: 01/22/2022 Goal Status: Active Ulcer/skin breakdown will have a volume reduction of 30% by week 4 Date Initiated: 11/27/2021 Date Inactivated: 12/31/2021 Target Resolution Date: 12/25/2021 Unmet Reason: according to Goal Status: Unmet measurements has not reduced. Interventions: Assess patient/caregiver ability to obtain necessary supplies Assess patient/caregiver ability to perform ulcer/skin care regimen upon admission and as needed  Assess ulceration(s) every visit Provide education on ulcer and skin care Treatment Activities: Topical wound management initiated : 11/27/2021 Notes: Electronic Signature(s) Signed: 01/07/2022 5:08:54 PM By: [name] Entered By: Rhae Hammock on 01/07/2022  10:53:31 -------------------------------------------------------------------------------- Pain Assessment Details Patient Name: Date of Service: AZARIAH, LATENDRESSE [name] 01/07/2022 10:00 A M Medical Record Number: [id number] Patient Account Number: [id number] Date of Birth/Sex: Treating RN: [birth year] (88 y.o. [name] Primary Care Miaa Latterell: Marda Stalker Other Clinician: Referring Vermell Madrid: Treating Edy Belt/Extender: Marjo Bicker Weeks in Treatment: 5 Active Problems Location of Pain Severity and Description of Pain Patient Has Paino No Site Locations Rate the pain. Current Pain Level: 0 Pain Management and Medication Current Pain Management: Medication: No Cold Application: No Rest: No Massage: No Activity: No T.E.N.S.: No Heat Application: No Leg drop or elevation: No Is the Current Pain Management Adequate: Adequate How does your wound impact your activities of daily livingo Sleep: No Bathing: No Appetite: No Relationship With Others: No Bladder Continence: No Emotions: No Bowel Continence: No Work: No Toileting: No Drive: No Dressing: No Hobbies: No Engineer, maintenance) Signed: 01/07/2022 4:02:17 PM By: Deon Pilling RN, BSN Entered By: Deon Pilling on 01/07/2022 10:24:49 -------------------------------------------------------------------------------- Patient/Caregiver Education Details Patient Name: Date of Service: Thalia Party [name] 2/16/2023andnbsp10:00 A M Medical Record Number: [id number] Patient Account Number: [id number] Date of Birth/Gender: Treating RN: 1925/05/24 (88 y.o. [name] Primary Care Physician: Marda Stalker Other Clinician: Referring Physician: Treating Physician/Extender: Aviva Signs in Treatment: 5 Education Assessment Education Provided To: Patient Education Topics Provided Nutrition: Methods: Explain/Verbal Responses: Reinforcements needed, State  content correctly Wound/Skin Impairment: Methods: Explain/Verbal Responses: Reinforcements needed, State content correctly Electronic Signature(s) Signed: 01/07/2022 5:08:54 PM By: [name] Entered By: Rhae Hammock on 01/07/2022 10:53:56 -------------------------------------------------------------------------------- Wound Assessment Details Patient Name: Date of Service: Hammitt, [place] 01/07/2022 10:00 A M Medical Record Number: [id number] Patient Account Number: [id number] Date of Birth/Sex: Treating RN: [birth year] (88 y.o. [name] Primary Care Sherilynn Dieu: Marda Stalker Other Clinician: Referring Rayyan Burley: Treating Travanti Mcmanus/Extender: Marjo Bicker Weeks in Treatment: 5 Wound Status Wound Number: 26 Primary Venous Leg Ulcer Etiology: Wound Location: Left, Lateral Ankle Wound Open Wounding Event: Gradually Appeared Status: Date Acquired: 11/20/2021 Comorbid Anemia, Congestive Heart Failure, Hypertension, Peripheral Weeks Of Treatment: 5 History: Venous Disease, Osteoarthritis, Received Chemotherapy Clustered Wound: No Photos Wound Measurements Length: (cm) 4.1 Width: (cm) 3.7 Depth: (cm) 0.2 Area: (cm) 11.914 Volume: (cm) 2.383 % Reduction in Area: -26.4% % Reduction in Volume: -153% Epithelialization: Small (1-33%) Tunneling: No Undermining: No Wound Description Classification: Full Thickness Without Exposed Support Structures Wound Margin: Distinct, outline attached Exudate Amount: Medium Exudate Type: Serosanguineous Exudate Color: red, brown Foul Odor After Cleansing: No Slough/Fibrino Yes Wound Bed Granulation Amount: Large (67-100%) Exposed Structure Granulation Quality: Red Fascia Exposed: No Necrotic Amount: Small (1-33%) Fat Layer (Subcutaneous Tissue) Exposed: Yes Necrotic Quality: Adherent Slough Tendon Exposed: No Muscle Exposed: No Joint Exposed: No Bone Exposed: No Treatment  Notes Wound #26 (Ankle) Wound Laterality: Left, Lateral Cleanser Soap and Water Discharge Instruction: May shower and wash wound with dial antibacterial soap and water prior to dressing change. Peri-Wound Care Sween Lotion (Moisturizing lotion) Discharge Instruction: Apply moisturizing lotion as directed Topical Keystone topica Antibiotics Discharge Instruction: Mix medication according to pharmacy instruction apply to wound bed. Primary Dressing Hydrofera Blue Classic Foam, 4x4 in Discharge Instruction: Moisten with saline apply over the [facility] medication. Secondary Dressing Zetuvit Plus 4x8 in Discharge Instruction: Apply over primary dressing  as directed. Secured With Compression Wrap ThreePress (3 layer compression wrap) Discharge Instruction: Apply three layer compression as directed. Compression Stockings Add-Ons Electronic Signature(s) Signed: 01/07/2022 5:08:54 PM By: [name] Signed: 01/07/2022 5:10:40 PM By: Lorrin Jackson Entered By: Lorrin Jackson on 01/07/2022 10:35:48 -------------------------------------------------------------------------------- Vitals Details Patient Name: Date of Service: RANA, HOCHSTEIN [name] 01/07/2022 10:00 A M Medical Record Number: [id number] Patient Account Number: [id number] Date of Birth/Sex: Treating RN: 12/10/1924 (88 y.o. [name] Shoe, Tammi Klippel Primary Care Aviv Rota: Marda Stalker Other Clinician: Referring Arliss Hepburn: Treating Edrik Rundle/Extender: Marjo Bicker Weeks in Treatment: 5 Vital Signs Time Taken: 10:20 Temperature (F): 98 Height (in): 66 Pulse (bpm): 73 Weight (lbs): 176 Respiratory Rate (breaths/min): 20 Body Mass Index (BMI): 28.4 Blood Pressure (mmHg): 121/68 Reference Range: 80 - 120 mg / dl Electronic Signature(s) Signed: 01/07/2022 4:02:17 PM By: Deon Pilling RN, BSN Entered By: Deon Pilling on 01/07/2022 10:24:40

## 2022-01-07 NOTE — Progress Notes (Signed)
[name] ([id number]) Visit Report for 01/07/2022 Chief Complaint Document Details Patient Name: Date of Service: [name], [name] 01/07/2022 10:00 A M Medical Record Number: [id number] Patient Account Number: [id number] Date of Birth/Sex: Treating RN: 01/05/1925 (88 y.o. F) Primary Care Provider: Marda Stalker Other Clinician: Referring Provider: Treating Provider/Extender: Aviva Signs in Treatment: 5 Information Obtained from: Patient Chief Complaint Left lower extremity wound Electronic Signature(s) Signed: 01/07/2022 11:42:09 AM By: Kalman Shan DO Entered By: Kalman Shan on 01/07/2022 11:13:39 -------------------------------------------------------------------------------- Debridement Details Patient Name: Date of Service: [name], [name] 01/07/2022 10:00 A M Medical Record Number: [id number] Patient Account Number: [id number] Date of Birth/Sex: Treating RN: 13-Nov-1925 (88 y.o. [name], [name]: Marda Stalker Other Clinician: Referring Provider: Treating Provider/Extender: Marjo Bicker Weeks in Treatment: 5 Debridement Performed for Assessment: Wound #26 Left,Lateral Ankle Performed By: Physician Kalman Shan, DO Debridement Type: Debridement Severity of Tissue Pre Debridement: Fat layer exposed Level of Consciousness (Pre-procedure): Awake and Alert Pre-procedure Verification/Time Out Yes - 10:45 Taken: Start Time: 10:45 Pain Control: Lidocaine T Area Debrided (L x W): otal 4.1 (cm) x 3.7 (cm) = 15.17 (cm) Tissue and other material debrided: Viable, Non-Viable, Slough, Subcutaneous, Slough Level: Skin/Subcutaneous Tissue Debridement Description: Excisional Instrument: Curette Bleeding: Minimum Hemostasis Achieved: Pressure End Time: 10:45 Procedural Pain: 0 Post Procedural Pain: 0 Response to Treatment: Procedure was tolerated well Level of Consciousness  (Post- Awake and Alert procedure): Post Debridement Measurements of Total Wound Length: (cm) 4.1 Width: (cm) 3.7 Depth: (cm) 0.2 Volume: (cm) 2.383 Character of Wound/Ulcer Post Debridement: Improved Severity of Tissue Post Debridement: Fat layer exposed Post Procedure Diagnosis Same as Pre-procedure Electronic Signature(s) Signed: 01/07/2022 11:42:09 AM By: Kalman Shan DO Signed: 01/07/2022 5:08:54 PM By: [name] Entered By: Rhae Hammock on 01/07/2022 10:47:50 -------------------------------------------------------------------------------- HPI Details Patient Name: Date of Service: [name], [name] 01/07/2022 10:00 A M Medical Record Number: [id number] Patient Account Number: [id number] Date of Birth/Sex: Treating RN: 01-Oct-1925 (88 y.o. F) Primary Care Provider: Marda Stalker Other Clinician: Referring Provider: Treating Provider/Extender: Aviva Signs in Treatment: 5 History of Present Illness HPI Description: this is a patient we know from several prior wounds on her bilateral lower extremities. She has venous stasis physiology, inflammation and hypertension. She is been fully evaluated for venous ablation and was found not to be a candidate. She does not have significant arterial disease. 02/14/15 the wound itself on her lateral left leg did not look too bad however there was surrounding maceration which was concerning 02/27/15 the wound itself is small with surrounding circumferential epithelialization there is no surrounding maceration. 03/06/15 only a very small area remains. I think this is mostly epithelialized however I would be uncomfortable not dressing this this week 03/13/15 the area is epithelialized. There was a thick surface on this. I took a #15 blade then lightly disrupted it but there does not appear to be any open area I did not see anything but appropriate epithelium Readmission: 08/16/18 on evaluation today  patient presents for initial evaluation and our clinic released readmission although she has not been seen since April 2016. Nonetheless she is having issues with the ulcers currently on the bilateral lateral malleolus or locations as well as the left lateral lower extremity. These have been present for roughly 3 weeks since the patient actually developed significant bilateral lower extremity edema which patient's daughter states was roughly 3 times the size of her legs currently. Subsequently she ended up in the  emerging department due to congestive heart failure. They were able to get the swelling under control and fortunately she seems to be doing much better at this point in time. She was placed in an [employer] which she sat on for week part evaluation today as well. Fortunately the wound areas actually seem to be doing fairly well there is some macerated skin surrounding there are the areas/surface of the wound which are gonna require some debridement at this point. No fevers, chills, nausea, or vomiting noted at this time. Patient has a history of hypertension, mild peripheral vascular disease, chronic venous stasis, and other than the ulcer seems to be doing fairly well today. No fevers, chills, nausea, or vomiting noted at this time. 08/23/18 on evaluation today patient actually appears to be doing great in regard to her bilateral lower should be ulcers. She's shown signs of improvement even just with one week with the [employer]. In general I'm actually very pleased with how things appear at this point. 09/06/18 on evaluation today patient actually appears to be doing much better her left lower extremity is completely healed. Go to the right lower extremity lateral malleolus ulcer this still seems to be showing signs of being open. There does not appear to be evidence of infection which is good news. This did require some sharp debridement however to remove some of the necrotic  tissue/eschar on the surface of the wound Readmission 11/27/2021 [name] is a 88 year old female with a past medical history of endometrial and breast cancer, chronic venous insufficiency, dementia and essential hypertension that presents to the clinic with a 1 month history of nonhealing ulcer to the left lower extremity. She states that the ulcer opened 1 month ago and then healed but has reopened again in the past week. She is currently keeping the area covered. She has a history of wounds to her lower extremities. She has compression stockings that she uses daily. She currently denies signs of infection. 1/13; patient presents for follow-up. She followed up for nurse visit for wrap change. She reports more odor. She denies pain. 1/20; patient presents for follow-up. She has been using gentamicin ointment with calcium alginate to the wound bed. She reports improvement in odor and drainage. She currently denies systemic signs of infection. 1/27; patient presents for follow-up. She has been using gentamicin ointment with calcium alginate. She received her Keystone antibiotic 2 days ago. She brought this in. Home health has also been established. They will come out for the first time next week. She currently denies signs of infection. She has no issues or complaints today. 2/2; patient presents for follow-up. She has been using Keystone antibiotics with [facility] with no issues. She denies signs of infection. She has no issues or complaints today. 2/9; patient presents for follow-up. She has been using Keystone antibiotics with [facility] with no issues. She has tolerated the compression wrap well. She denies signs of infection. 2/16; patient presents for follow-up. She has been using Keystone antibiotics with Hydrofera Blue under compression wrap with no issues. She currently denies signs of infection. Electronic Signature(s) Signed: 01/07/2022 11:42:09 AM By: Kalman Shan  DO Entered By: Kalman Shan on 01/07/2022 11:14:10 -------------------------------------------------------------------------------- Physical Exam Details Patient Name: Date of Service: [name], [name] 01/07/2022 10:00 A M Medical Record Number: [id number] Patient Account Number: [id number] Date of Birth/Sex: Treating RN: 10-13-1925 (88 y.o. F) Primary Care Provider: Marda Stalker Other Clinician: Referring Provider: Treating Provider/Extender: Marjo Bicker  Weeks in Treatment: 5 Constitutional respirations regular, non-labored and within target range for patient.. Cardiovascular 2+ dorsalis pedis/posterior tibialis pulses. Psychiatric pleasant and cooperative. Notes Left lower extremity: T the distal lateral aspect there is an open wound with granulation tissue and nonviable tissue. Appears well-healing. No signs of o surrounding infection. Electronic Signature(s) Signed: 01/07/2022 11:42:09 AM By: Kalman Shan DO Entered By: Kalman Shan on 01/07/2022 11:14:35 -------------------------------------------------------------------------------- Physician Orders Details Patient Name: Date of Service: ERSA, DELANEY [name] 01/07/2022 10:00 A M Medical Record Number: [id number] Patient Account Number: [id number] Date of Birth/Sex: Treating RN: [birth year] (88 y.o. [name], [name]: Marda Stalker Other Clinician: Referring Provider: Treating Provider/Extender: Aviva Signs in Treatment: 5 Verbal / Phone Orders: No Diagnosis Coding Follow-up Appointments ppointment in 1 week. - Dr. Heber Gregory and Allayne Butcher, RN: Room # 6 Return A ***Continue to bring keystone topical antibiotic medication to each visit.*** Bathing/ Shower/ Hygiene May shower with protection but do not get wound dressing(s) wet. - use cast protector. May shower and wash wound with soap and water. - with dressing changes may wash with  soap and water. Edema Control - Lymphedema / SCD / Other Elevate legs to the level of the heart or above for 30 minutes daily and/or when sitting, a frequency of: Avoid standing for long periods of time. Patient to wear own compression stockings every day. - apply in the morning and remove at night to right leg. Exercise regularly Moisturize legs daily. - every night before day right leg. Additional Orders / Instructions Follow Nutritious Diet - High Protein Diet Home Health No change in wound care orders this week; continue Home Health for wound care. May utilize formulary equivalent dressing for wound treatment orders unless otherwise specified. - Home Health for skilled wound care nursing once a week and wound center weekly. Apply keystone topical antibiotics and hydrofera blue classic as primary dressing. Other Home Health Orders/Instructions: [facility] Wound Treatment Wound #26 - Ankle Wound Laterality: Left, Lateral Cleanser: Soap and Water (Home Health) 2 x Per Week/30 Days Discharge Instructions: May shower and wash wound with dial antibacterial soap and water prior to dressing change. Peri-Wound Care: Sween Lotion (Moisturizing lotion) (Home Health) 2 x Per Week/30 Days Discharge Instructions: Apply moisturizing lotion as directed Topical: Keystone topica Antibiotics (Home Health) 2 x Per Week/30 Days Discharge Instructions: Mix medication according to pharmacy instruction apply to wound bed. Prim Dressing: Hydrofera Blue Classic Foam, 4x4 in (Home Health) 2 x Per Week/30 Days ary Discharge Instructions: Moisten with saline apply over the [facility] medication. Secondary Dressing: Zetuvit Plus 4x8 in [facility]) 2 x Per Week/30 Days Discharge Instructions: Apply over primary dressing as directed. Compression Wrap: ThreePress (3 layer compression wrap) (Home Health) 2 x Per Week/30 Days Discharge Instructions: Apply three layer compression as directed. Electronic  Signature(s) Signed: 01/07/2022 11:42:09 AM By: Kalman Shan DO Entered By: Kalman Shan on 01/07/2022 11:14:54 -------------------------------------------------------------------------------- Problem List Details Patient Name: Date of Service: KIMBERLYANN, HOLLAR [name] 01/07/2022 10:00 A M Medical Record Number: [id number] Patient Account Number: [id number] Date of Birth/Sex: Treating RN: 12-25-1924 (88 y.o. F) Primary Care Provider: Marda Stalker Other Clinician: Referring Provider: Treating Provider/Extender: Aviva Signs in Treatment: 5 Active Problems ICD-10 Encounter Code Description Active Date MDM Diagnosis I87.312 Chronic venous hypertension (idiopathic) with ulcer of left lower extremity 11/27/2021 No Yes L97.822 Non-pressure chronic ulcer of other part of left lower leg with fat layer exposed1/04/2022 No Yes C50.919 Malignant neoplasm of  unspecified site of unspecified female breast 11/27/2021 No Yes I50.42 Chronic combined systolic (congestive) and diastolic (congestive) heart failure 11/27/2021 No Yes G30.9 Alzheimer's disease, unspecified 11/27/2021 No Yes C54.1 Malignant neoplasm of endometrium 11/27/2021 No Yes Inactive Problems Resolved Problems Electronic Signature(s) Signed: 01/07/2022 11:42:09 AM By: Kalman Shan DO Entered By: Kalman Shan on 01/07/2022 11:13:09 -------------------------------------------------------------------------------- Progress Note Details Patient Name: Date of Service: [name], [name] 01/07/2022 10:00 A M Medical Record Number: [id number] Patient Account Number: [id number] Date of Birth/Sex: Treating RN: 1925-01-18 (88 y.o. F) Primary Care Provider: Marda Stalker Other Clinician: Referring Provider: Treating Provider/Extender: Aviva Signs in Treatment: 5 Subjective Chief Complaint Information obtained from Patient Left lower extremity wound History of Present  Illness (HPI) this is a patient we know from several prior wounds on her bilateral lower extremities. She has venous stasis physiology, inflammation and hypertension. She is been fully evaluated for venous ablation and was found not to be a candidate. She does not have significant arterial disease. 02/14/15 the wound itself on her lateral left leg did not look too bad however there was surrounding maceration which was concerning 02/27/15 the wound itself is small with surrounding circumferential epithelialization there is no surrounding maceration. 03/06/15 only a very small area remains. I think this is mostly epithelialized however I would be uncomfortable not dressing this this week 03/13/15 the area is epithelialized. There was a thick surface on this. I took a #15 blade then lightly disrupted it but there does not appear to be any open area I did not see anything but appropriate epithelium Readmission: 08/16/18 on evaluation today patient presents for initial evaluation and our clinic released readmission although she has not been seen since April 2016. Nonetheless she is having issues with the ulcers currently on the bilateral lateral malleolus or locations as well as the left lateral lower extremity. These have been present for roughly 3 weeks since the patient actually developed significant bilateral lower extremity edema which patient's daughter states was roughly 3 times the size of her legs currently. Subsequently she ended up in the emerging department due to congestive heart failure. They were able to get the swelling under control and fortunately she seems to be doing much better at this point in time. She was placed in an [employer] which she sat on for week part evaluation today as well. Fortunately the wound areas actually seem to be doing fairly well there is some macerated skin surrounding there are the areas/surface of the wound which are gonna require some debridement at this  point. No fevers, chills, nausea, or vomiting noted at this time. Patient has a history of hypertension, mild peripheral vascular disease, chronic venous stasis, and other than the ulcer seems to be doing fairly well today. No fevers, chills, nausea, or vomiting noted at this time. 08/23/18 on evaluation today patient actually appears to be doing great in regard to her bilateral lower should be ulcers. She's shown signs of improvement even just with one week with the [employer]. In general I'm actually very pleased with how things appear at this point. 09/06/18 on evaluation today patient actually appears to be doing much better her left lower extremity is completely healed. Go to the right lower extremity lateral malleolus ulcer this still seems to be showing signs of being open. There does not appear to be evidence of infection which is good news. This did require some sharp debridement however to remove some of the necrotic tissue/eschar on  the surface of the wound Readmission 11/27/2021 [name] is a 88 year old female with a past medical history of endometrial and breast cancer, chronic venous insufficiency, dementia and essential hypertension that presents to the clinic with a 1 month history of nonhealing ulcer to the left lower extremity. She states that the ulcer opened 1 month ago and then healed but has reopened again in the past week. She is currently keeping the area covered. She has a history of wounds to her lower extremities. She has compression stockings that she uses daily. She currently denies signs of infection. 1/13; patient presents for follow-up. She followed up for nurse visit for wrap change. She reports more odor. She denies pain. 1/20; patient presents for follow-up. She has been using gentamicin ointment with calcium alginate to the wound bed. She reports improvement in odor and drainage. She currently denies systemic signs of infection. 1/27; patient  presents for follow-up. She has been using gentamicin ointment with calcium alginate. She received her Keystone antibiotic 2 days ago. She brought this in. Home health has also been established. They will come out for the first time next week. She currently denies signs of infection. She has no issues or complaints today. 2/2; patient presents for follow-up. She has been using Keystone antibiotics with [facility] with no issues. She denies signs of infection. She has no issues or complaints today. 2/9; patient presents for follow-up. She has been using Keystone antibiotics with [facility] with no issues. She has tolerated the compression wrap well. She denies signs of infection. 2/16; patient presents for follow-up. She has been using Keystone antibiotics with Hydrofera Blue under compression wrap with no issues. She currently denies signs of infection. Patient History Information obtained from Patient. Family History Cancer - Child, Hypertension - Child,Father, Kidney Disease - Child, Stroke - Siblings,Child, Thyroid Problems - Child, No family history of Diabetes, Heart Disease, Hereditary Spherocytosis, Lung Disease, Seizures, Tuberculosis. Social History Never smoker, Marital Status - Widowed, Alcohol Use - Never, Drug Use - No History, Caffeine Use - Never. Medical History Eyes Denies history of Cataracts, Glaucoma, Optic Neuritis Ear/Nose/Mouth/Throat Denies history of Chronic sinus problems/congestion, Middle ear problems Hematologic/Lymphatic Patient has history of Anemia Denies history of Hemophilia, Human Immunodeficiency Virus, Lymphedema, Sickle Cell Disease Respiratory Denies history of Aspiration, Asthma, Chronic Obstructive Pulmonary Disease (COPD), Pneumothorax, Sleep Apnea, Tuberculosis Cardiovascular Patient has history of Congestive Heart Failure, Hypertension, Peripheral Venous Disease Denies history of Angina, Arrhythmia, Coronary Artery Disease, Hypotension,  Myocardial Infarction, Peripheral Arterial Disease, Phlebitis, Vasculitis Gastrointestinal Denies history of Cirrhosis , Colitis, Crohnoos, Hepatitis A, Hepatitis B, Hepatitis C Endocrine Denies history of Type I Diabetes, Type II Diabetes Genitourinary Denies history of End Stage Renal Disease Immunological Denies history of Lupus Erythematosus, Raynaudoos, Scleroderma Integumentary (Skin) Denies history of History of Burn Musculoskeletal Patient has history of Osteoarthritis Denies history of Gout, Rheumatoid Arthritis, Osteomyelitis Neurologic Denies history of Neuropathy, Quadriplegia, Paraplegia, Seizure Disorder Oncologic Patient has history of Received Chemotherapy - Breast and Ovarian Cancer Denies history of Received Radiation Hospitalization/Surgery History - diverticulits. Objective Constitutional respirations regular, non-labored and within target range for patient.. Vitals Time Taken: 10:20 AM, Height: 66 in, Weight: 176 lbs, BMI: 28.4, Temperature: 98 F, Pulse: 73 bpm, Respiratory Rate: 20 breaths/min, Blood Pressure: 121/68 mmHg. Cardiovascular 2+ dorsalis pedis/posterior tibialis pulses. Psychiatric pleasant and cooperative. General Notes: Left lower extremity: T the distal lateral aspect there is an open wound with granulation tissue and nonviable tissue. Appears well-healing. No o signs of surrounding  infection. Integumentary (Hair, Skin) Wound #26 status is Open. Original cause of wound was Gradually Appeared. The date acquired was: 11/20/2021. The wound has been in treatment 5 weeks. The wound is located on the Left,Lateral Ankle. The wound measures 4.1cm length x 3.7cm width x 0.2cm depth; 11.914cm^2 area and 2.383cm^3 volume. There is Fat Layer (Subcutaneous Tissue) exposed. There is no tunneling or undermining noted. There is a medium amount of serosanguineous drainage noted. The wound margin is distinct with the outline attached to the wound base. There  is large (67-100%) red granulation within the wound bed. There is a small (1-33%) amount of necrotic tissue within the wound bed including Adherent Slough. Assessment Active Problems ICD-10 Chronic venous hypertension (idiopathic) with ulcer of left lower extremity Non-pressure chronic ulcer of other part of left lower leg with fat layer exposed Malignant neoplasm of unspecified site of unspecified female breast Chronic combined systolic (congestive) and diastolic (congestive) heart failure Alzheimer's disease, unspecified Malignant neoplasm of endometrium Patient's wound has shown improvement in appearance since last clinic visit. Appears well-healing. No signs of infection on exam. I debrided nonviable tissue. I recommended continuing with Keystone antibiotic and Hydrofera Blue under 3 layer compression. Procedures Wound #26 Pre-procedure diagnosis of Wound #26 is a Venous Leg Ulcer located on the Left,Lateral Ankle .Severity of Tissue Pre Debridement is: Fat layer exposed. There was a Excisional Skin/Subcutaneous Tissue Debridement with a total area of 15.17 sq cm performed by Kalman Shan, DO. With the following instrument(s): Curette to remove Viable and Non-Viable tissue/material. Material removed includes Subcutaneous Tissue and Slough and after achieving pain control using Lidocaine. No specimens were taken. A time out was conducted at 10:45, prior to the start of the procedure. A Minimum amount of bleeding was controlled with Pressure. The procedure was tolerated well with a pain level of 0 throughout and a pain level of 0 following the procedure. Post Debridement Measurements: 4.1cm length x 3.7cm width x 0.2cm depth; 2.383cm^3 volume. Character of Wound/Ulcer Post Debridement is improved. Severity of Tissue Post Debridement is: Fat layer exposed. Post procedure Diagnosis Wound #26: Same as Pre-Procedure Pre-procedure diagnosis of Wound #26 is a Venous Leg Ulcer located on the  Left,Lateral Ankle . There was a Three Layer Compression Therapy Procedure by Rhae Hammock, RN. Post procedure Diagnosis Wound #26: Same as Pre-Procedure Plan Follow-up Appointments: Return Appointment in 1 week. - Dr. Heber [name] and Allayne Butcher, RN: Room # 6 ***Continue to bring keystone topical antibiotic medication to each visit.*** Bathing/ Shower/ Hygiene: May shower with protection but do not get wound dressing(s) wet. - use cast protector. May shower and wash wound with soap and water. - with dressing changes may wash with soap and water. Edema Control - Lymphedema / SCD / Other: Elevate legs to the level of the heart or above for 30 minutes daily and/or when sitting, a frequency of: Avoid standing for long periods of time. Patient to wear own compression stockings every day. - apply in the morning and remove at night to right leg. Exercise regularly Moisturize legs daily. - every night before day right leg. Additional Orders / Instructions: Follow Nutritious Diet - High Protein Diet Home Health: No change in wound care orders this week; continue Home Health for wound care. May utilize formulary equivalent dressing for wound treatment orders unless otherwise specified. - Home Health for skilled wound care nursing once a week and wound center weekly. Apply keystone topical antibiotics and hydrofera blue classic as primary dressing. Other Home Health Orders/Instructions: -  [facility] WOUND #26: - Ankle Wound Laterality: Left, Lateral Cleanser: Soap and Water (Home Health) 2 x Per Week/30 Days Discharge Instructions: May shower and wash wound with dial antibacterial soap and water prior to dressing change. Peri-Wound Care: Sween Lotion (Moisturizing lotion) (Home Health) 2 x Per Week/30 Days Discharge Instructions: Apply moisturizing lotion as directed Topical: Keystone topica Antibiotics (Home Health) 2 x Per Week/30 Days Discharge Instructions: Mix medication according to  pharmacy instruction apply to wound bed. Prim Dressing: Hydrofera Blue Classic Foam, 4x4 in (Home Health) 2 x Per Week/30 Days ary Discharge Instructions: Moisten with saline apply over the [facility] medication. Secondary Dressing: Zetuvit Plus 4x8 in [facility]) 2 x Per Week/30 Days Discharge Instructions: Apply over primary dressing as directed. Com pression Wrap: ThreePress (3 layer compression wrap) (Home Health) 2 x Per Week/30 Days Discharge Instructions: Apply three layer compression as directed. 1. In office sharp debridement 2. Keystone antibiotic and Hydrofera Blue 3. 3 layer compression 4. Follow-up in 1 week Electronic Signature(s) Signed: 01/07/2022 11:42:09 AM By: Kalman Shan DO Entered By: Kalman Shan on 01/07/2022 11:41:23 -------------------------------------------------------------------------------- HxROS Details Patient Name: Date of Service: [name], [name] 01/07/2022 10:00 A M Medical Record Number: [id number] Patient Account Number: [id number] Date of Birth/Sex: Treating RN: 01/03/1925 (88 y.o. F) Primary Care Provider: Marda Stalker Other Clinician: Referring Provider: Treating Provider/Extender: Aviva Signs in Treatment: 5 Information Obtained From Patient Eyes Medical History: Negative for: Cataracts; Glaucoma; Optic Neuritis Ear/Nose/Mouth/Throat Medical History: Negative for: Chronic sinus problems/congestion; Middle ear problems Hematologic/Lymphatic Medical History: Positive for: Anemia Negative for: Hemophilia; Human Immunodeficiency Virus; Lymphedema; Sickle Cell Disease Respiratory Medical History: Negative for: Aspiration; Asthma; Chronic Obstructive Pulmonary Disease (COPD); Pneumothorax; Sleep Apnea; Tuberculosis Cardiovascular Medical History: Positive for: Congestive Heart Failure; Hypertension; Peripheral Venous Disease Negative for: Angina; Arrhythmia; Coronary Artery Disease;  Hypotension; Myocardial Infarction; Peripheral Arterial Disease; Phlebitis; Vasculitis Gastrointestinal Medical History: Negative for: Cirrhosis ; Colitis; Crohns; Hepatitis A; Hepatitis B; Hepatitis C Endocrine Medical History: Negative for: Type I Diabetes; Type II Diabetes Genitourinary Medical History: Negative for: End Stage Renal Disease Immunological Medical History: Negative for: Lupus Erythematosus; Raynauds; Scleroderma Integumentary (Skin) Medical History: Negative for: History of Burn Musculoskeletal Medical History: Positive for: Osteoarthritis Negative for: Gout; Rheumatoid Arthritis; Osteomyelitis Neurologic Medical History: Negative for: Neuropathy; Quadriplegia; Paraplegia; Seizure Disorder Oncologic Medical History: Positive for: Received Chemotherapy - Breast and Ovarian Cancer Negative for: Received Radiation Immunizations Pneumococcal Vaccine: Received Pneumococcal Vaccination: Yes Received Pneumococcal Vaccination On or After 60th Birthday: Yes Implantable Devices None Hospitalization / Surgery History Type of Hospitalization/Surgery diverticulits Family and Social History Cancer: Yes - Child; Diabetes: No; Heart Disease: No; Hereditary Spherocytosis: No; Hypertension: Yes - Child,Father; Kidney Disease: Yes - Child; Lung Disease: No; Seizures: No; Stroke: Yes - Siblings,Child; Thyroid Problems: Yes - Child; Tuberculosis: No; Never smoker; Marital Status - Widowed; Alcohol Use: Never; Drug Use: No History; Caffeine Use: Never; Financial Concerns: No; Food, Clothing or Shelter Needs: No; Support System Lacking: No; Transportation Concerns: No Electronic Signature(s) Signed: 01/07/2022 11:42:09 AM By: Kalman Shan DO Entered By: Kalman Shan on 01/07/2022 11:14:17 -------------------------------------------------------------------------------- SuperBill Details Patient Name: Date of Service: JENEFER, WOERNER [name] 01/07/2022 Medical Record  Number: [id number] Patient Account Number: [id number] Date of Birth/Sex: Treating RN: November 10, 1925 (88 y.o. [name], [name]: Marda Stalker Other Clinician: Referring Provider: Treating Provider/Extender: Marjo Bicker Weeks in Treatment: 5 Diagnosis Coding ICD-10 Codes Code Description [phone] Chronic venous hypertension (idiopathic) with ulcer of left lower  extremity [phone] Non-pressure chronic ulcer of other part of left lower leg with fat layer exposed C50.919 Malignant neoplasm of unspecified site of unspecified female breast I50.42 Chronic combined systolic (congestive) and diastolic (congestive) heart failure G30.9 Alzheimer's disease, unspecified C54.1 Malignant neoplasm of endometrium Facility Procedures CPT4 Code: [id number] Description: 45848 - DEB SUBQ TISSUE 20 SQ CM/< ICD-10 Diagnosis Description L97.822 Non-pressure chronic ulcer of other part of left lower leg with fat layer expo Modifier: sed Quantity: 1 Physician Procedures : CPT4 Code Description Modifier [id number] 11042 - WC PHYS SUBQ TISS 20 SQ CM ICD-10 Diagnosis Description L97.822 Non-pressure chronic ulcer of other part of left lower leg with fat layer exposed Quantity: 1 Electronic Signature(s) Signed: 01/07/2022 11:42:09 AM By: Kalman Shan DO Entered By: Kalman Shan on 01/07/2022 11:41:44

## 2022-01-14 ENCOUNTER — Encounter (HOSPITAL_BASED_OUTPATIENT_CLINIC_OR_DEPARTMENT_OTHER): Payer: Medicare PPO | Admitting: Internal Medicine

## 2022-01-14 ENCOUNTER — Other Ambulatory Visit: Payer: Self-pay

## 2022-01-14 DIAGNOSIS — I87312 Chronic venous hypertension (idiopathic) with ulcer of left lower extremity: Secondary | ICD-10-CM

## 2022-01-14 DIAGNOSIS — L97822 Non-pressure chronic ulcer of other part of left lower leg with fat layer exposed: Secondary | ICD-10-CM | POA: Diagnosis not present

## 2022-01-14 DIAGNOSIS — I5042 Chronic combined systolic (congestive) and diastolic (congestive) heart failure: Secondary | ICD-10-CM | POA: Diagnosis not present

## 2022-01-14 DIAGNOSIS — M1990 Unspecified osteoarthritis, unspecified site: Secondary | ICD-10-CM | POA: Diagnosis not present

## 2022-01-14 DIAGNOSIS — I739 Peripheral vascular disease, unspecified: Secondary | ICD-10-CM | POA: Diagnosis not present

## 2022-01-14 DIAGNOSIS — I872 Venous insufficiency (chronic) (peripheral): Secondary | ICD-10-CM | POA: Diagnosis not present

## 2022-01-14 DIAGNOSIS — I110 Hypertensive heart disease with heart failure: Secondary | ICD-10-CM | POA: Diagnosis not present

## 2022-01-20 NOTE — Progress Notes (Signed)
MILDRETH, REEK ([id number]) Visit Report for 01/14/2022 Arrival Information Details Patient Name: Date of Service: [name], [name] 01/14/2022 10:30 A M Medical Record Number: [id number] Patient Account Number: [id number] Date of Birth/Sex: Treating RN: 08-17-1925 (88 y.o. [name], [name] Primary Care Pauline Trainer: Marda Stalker Other Clinician: Referring Estiven Kohan: Treating Mercede Rollo/Extender: Aviva Signs in Treatment: 6 Visit Information History Since Last Visit Added or deleted any medications: No Patient Arrived: Wheel Chair Any new allergies or adverse reactions: No Arrival Time: 10:30 Had a fall or experienced change in No Accompanied By: daughter activities of daily living that may affect Transfer Assistance: Manual risk of falls: Patient Identification Verified: Yes Signs or symptoms of abuse/neglect since last visito No Secondary Verification Process Completed: Yes Hospitalized since last visit: No Patient Requires Transmission-Based Precautions: No Implantable device outside of the clinic excluding No Patient Has Alerts: No cellular tissue based products placed in the center since last visit: Has Dressing in Place as Prescribed: Yes Has Compression in Place as Prescribed: Yes Pain Present Now: No Electronic Signature(s) Signed: 01/20/2022 3:37:08 PM By: [name] Entered By: Rhae Hammock on 01/14/2022 10:31:29 -------------------------------------------------------------------------------- Compression Therapy Details Patient Name: Date of Service: [name] 01/14/2022 10:30 A M Medical Record Number: [id number] Patient Account Number: [id number] Date of Birth/Sex: Treating RN: [birth year] (88 y.o. [name], [name] Primary Care Libra Gatz: Marda Stalker Other Clinician: Referring Bonham Zingale: Treating Galvin Aversa/Extender: Marjo Bicker Weeks in Treatment: 6 Compression Therapy  Performed for Wound Assessment: Wound #26 Left,Lateral Ankle Performed By: Clinician Rhae Hammock, RN Compression Type: Three Layer Post Procedure Diagnosis Same as Pre-procedure Electronic Signature(s) Signed: 01/20/2022 3:37:08 PM By: [name] Entered By: Rhae Hammock on 01/14/2022 11:17:21 -------------------------------------------------------------------------------- Lower Extremity Assessment Details Patient Name: Date of Service: [name], [name] 01/14/2022 10:30 A M Medical Record Number: [id number] Patient Account Number: [id number] Date of Birth/Sex: Treating RN: [birth year] (88 y.o. [name], [name] Primary Care Autumnrose Yore: Marda Stalker Other Clinician: Referring Patriece Archbold: Treating Lumen Brinlee/Extender: Marjo Bicker Weeks in Treatment: 6 Edema Assessment Assessed: [name]: Yes] [Right: No] Edema: [Left: N] [Right: o] Calf Left: Right: Point of Measurement: 30 cm From Medial Instep 30 cm Ankle Left: Right: Point of Measurement: 11 cm From Medial Instep 20.5 cm Vascular Assessment Pulses: Dorsalis Pedis Palpable: [Left:Yes] Posterior Tibial Palpable: [Left:Yes] Electronic Signature(s) Signed: 01/20/2022 3:37:08 PM By: [name] Entered By: Rhae Hammock on 01/14/2022 10:45:07 -------------------------------------------------------------------------------- Multi Wound Chart Details Patient Name: Date of Service: [name] 01/14/2022 10:30 A M Medical Record Number: [id number] Patient Account Number: [id number] Date of Birth/Sex: Treating RN: Sep 09, 1925 (88 y.o. F) Primary Care Jurni Cesaro: Marda Stalker Other Clinician: Referring Weronika Birch: Treating Asyah Candler/Extender: Aviva Signs in Treatment: 6 Vital Signs Height(in): 66 Pulse(bpm): 65 Weight(lbs): 176 Blood Pressure(mmHg): 118/70 Body Mass Index(BMI): 28.4 Temperature(F): 98.1 Respiratory  Rate(breaths/min): 17 Photos: [26:Left, Lateral Ankle] [N/A:N/A N/A] Wound Location: [26:Gradually Appeared] [N/A:N/A] Wounding Event: [26:Venous Leg Ulcer] [N/A:N/A] Primary Etiology: [26:Anemia, Congestive Heart Failure,] [N/A:N/A] Comorbid History: [26:Hypertension, Peripheral Venous Disease, Osteoarthritis, Received Chemotherapy 11/20/2021] [N/A:N/A] Date Acquired: [26:6] [N/A:N/A] Weeks of Treatment: [26:Open] [N/A:N/A] Wound Status: [26:No] [N/A:N/A] Wound Recurrence: [26:4.3x3.5x0.2] [N/A:N/A] Measurements L x W x D (cm) [26:11.82] [N/A:N/A] A (cm) : rea [26:2.364] [N/A:N/A] Volume (cm) : [26:-25.40%] [N/A:N/A] % Reduction in A [26:rea: -151.00%] [N/A:N/A] % Reduction in Volume: [26:Full Thickness Without Exposed] [N/A:N/A] Classification: [26:Support Structures Medium] [N/A:N/A] Exudate A mount: [26:Serosanguineous] [N/A:N/A] Exudate Type: [26:red, brown] [N/A:N/A] Exudate Color: [26:Distinct, outline attached] [N/A:N/A] Wound Margin: [  26:Large (67-100%)] [N/A:N/A] Granulation A mount: [26:Red] [N/A:N/A] Granulation Quality: [26:Small (1-33%)] [N/A:N/A] Necrotic A mount: [26:Fat Layer (Subcutaneous Tissue): Yes N/A] Exposed Structures: [26:Fascia: No Tendon: No Muscle: No Joint: No Bone: No Small (1-33%)] [N/A:N/A] Epithelialization: [26:Debridement - Excisional] [N/A:N/A] Debridement: Pre-procedure Verification/Time Out 10:54 [N/A:N/A] Taken: [26:Lidocaine] [N/A:N/A] Pain Control: [26:Subcutaneous, Slough] [N/A:N/A] Tissue Debrided: [26:Skin/Subcutaneous Tissue] [N/A:N/A] Level: [26:15.05] [N/A:N/A] Debridement A (sq cm): [26:rea Curette] [N/A:N/A] Instrument: [26:Minimum] [N/A:N/A] Bleeding: [26:Silver Nitrate] [N/A:N/A] Hemostasis A chieved: [26:0] [N/A:N/A] Procedural Pain: [26:0] [N/A:N/A] Post Procedural Pain: [26:Procedure was tolerated well] [N/A:N/A] Debridement Treatment Response: [26:4.3x3.5x0.2] [N/A:N/A] Post Debridement Measurements L x W x D (cm)  [26:2.364] [N/A:N/A] Post Debridement Volume: (cm) [26:Debridement] [N/A:N/A] Treatment Notes Electronic Signature(s) Signed: 01/14/2022 11:09:02 AM By: Kalman Shan DO Entered By: Kalman Shan on 01/14/2022 11:01:50 -------------------------------------------------------------------------------- Multi-Disciplinary Care Plan Details Patient Name: Date of Service: TALAYA, LAMPRECHT [name] 01/14/2022 10:30 A M Medical Record Number: [id number] Patient Account Number: [id number] Date of Birth/Sex: Treating RN: May 08, 1925 (88 y.o. [name], [name] Primary Care Zakariya Knickerbocker: Marda Stalker Other Clinician: Referring Kimberlly Norgard: Treating Leona Alen/Extender: Aviva Signs in Treatment: 6 Active Inactive Nutrition Nursing Diagnoses: Imbalanced nutrition Goals: Patient/caregiver agrees to and verbalizes understanding of need to use nutritional supplements and/or vitamins as prescribed Date Initiated: 11/27/2021 Target Resolution Date: 01/22/2022 Goal Status: Active Interventions: Assess patient nutrition upon admission and as needed per policy Provide education on nutrition Treatment Activities: Education provided on Nutrition : 01/07/2022 Notes: Wound/Skin Impairment Nursing Diagnoses: Impaired tissue integrity Goals: Patient/caregiver will verbalize understanding of skin care regimen Date Initiated: 11/27/2021 Target Resolution Date: 01/22/2022 Goal Status: Active Ulcer/skin breakdown will have a volume reduction of 30% by week 4 Date Initiated: 11/27/2021 Date Inactivated: 12/31/2021 Target Resolution Date: 12/25/2021 Unmet Reason: according to Goal Status: Unmet measurements has not reduced. Interventions: Assess patient/caregiver ability to obtain necessary supplies Assess patient/caregiver ability to perform ulcer/skin care regimen upon admission and as needed Assess ulceration(s) every visit Provide education on ulcer and skin care Treatment  Activities: Topical wound management initiated : 11/27/2021 Notes: Electronic Signature(s) Signed: 01/20/2022 3:37:08 PM By: [name] Entered By: Rhae Hammock on 01/14/2022 10:51:36 -------------------------------------------------------------------------------- Pain Assessment Details Patient Name: Date of Service: KAYTI, POSS [name] 01/14/2022 10:30 A M Medical Record Number: [id number] Patient Account Number: [id number] Date of Birth/Sex: Treating RN: 12-25-1924 (88 y.o. [name], [name] Primary Care Jerami Tammen: Marda Stalker Other Clinician: Referring Lakenya Riendeau: Treating Truth Wolaver/Extender: Marjo Bicker Weeks in Treatment: 6 Active Problems Location of Pain Severity and Description of Pain Patient Has Paino No Site Locations Pain Management and Medication Current Pain Management: Electronic Signature(s) Signed: 01/20/2022 3:37:08 PM By: [name] Entered By: Rhae Hammock on 01/14/2022 10:32:19 -------------------------------------------------------------------------------- Patient/Caregiver Education Details Patient Name: Date of Service: Thalia Party [name] 2/23/2023andnbsp10:30 [place] Record Number: [id number] Patient Account Number: [id number] Date of Birth/Gender: Treating RN: [birth year] (88 y.o. [name], [name] Primary Care Physician: Marda Stalker Other Clinician: Referring Physician: Treating Physician/Extender: Aviva Signs in Treatment: 6 Education Assessment Education Provided To: Patient and Caregiver Education Topics Provided Basic Hygiene: Methods: Explain/Verbal Responses: Reinforcements needed, State content correctly Nutrition: Methods: Explain/Verbal Responses: Reinforcements needed, State content correctly Wound/Skin Impairment: Methods: Explain/Verbal Responses: Reinforcements needed, State content correctly Electronic Signature(s) Signed: 01/20/2022  3:37:08 PM By: [name] Entered By: Rhae Hammock on 01/14/2022 10:56:29 -------------------------------------------------------------------------------- Wound Assessment Details Patient Name: Date of Service: CLEDITH, KAMIYA [name] 01/14/2022 10:30 A M Medical Record Number: [id number] Patient Account Number: [id number] Date of Birth/Sex:  Treating RN: 1925-06-11 (88 y.o. [name], [name] Primary Care Aneri Slagel: Marda Stalker Other Clinician: Referring Chanse Kagel: Treating Yazid Pop/Extender: Marjo Bicker Weeks in Treatment: 6 Wound Status Wound Number: 26 Primary Venous Leg Ulcer Etiology: Wound Location: Left, Lateral Ankle Wound Open Wounding Event: Gradually Appeared Status: Date Acquired: 11/20/2021 Comorbid Anemia, Congestive Heart Failure, Hypertension, Peripheral Weeks Of Treatment: 6 History: Venous Disease, Osteoarthritis, Received Chemotherapy Clustered Wound: No Photos Wound Measurements Length: (cm) 4.3 Width: (cm) 3.5 Depth: (cm) 0.2 Area: (cm) 11.82 Volume: (cm) 2.364 % Reduction in Area: -25.4% % Reduction in Volume: -151% Epithelialization: Small (1-33%) Tunneling: No Undermining: No Wound Description Classification: Full Thickness Without Exposed Support Structures Wound Margin: Distinct, outline attached Exudate Amount: Medium Exudate Type: Serosanguineous Exudate Color: red, brown Foul Odor After Cleansing: No Slough/Fibrino Yes Wound Bed Granulation Amount: Large (67-100%) Exposed Structure Granulation Quality: Red Fascia Exposed: No Necrotic Amount: Small (1-33%) Fat Layer (Subcutaneous Tissue) Exposed: Yes Necrotic Quality: Adherent Slough Tendon Exposed: No Muscle Exposed: No Joint Exposed: No Bone Exposed: No Electronic Signature(s) Signed: 01/20/2022 3:37:08 PM By: [name] Entered By: Rhae Hammock on 01/14/2022  10:48:22 -------------------------------------------------------------------------------- Vitals Details Patient Name: Date of Service: TYRA, GURAL [name] 01/14/2022 10:30 A M Medical Record Number: [id number] Patient Account Number: [id number] Date of Birth/Sex: Treating RN: June 13, 1925 (88 y.o. [name], [name] Primary Care Marabelle Cushman: Marda Stalker Other Clinician: Referring Lolamae Voisin: Treating Lazara Grieser/Extender: Marjo Bicker Weeks in Treatment: 6 Vital Signs Time Taken: 10:31 Temperature (F): 98.1 Height (in): 66 Pulse (bpm): 65 Weight (lbs): 176 Respiratory Rate (breaths/min): 17 Body Mass Index (BMI): 28.4 Blood Pressure (mmHg): 118/70 Reference Range: 80 - 120 mg / dl Electronic Signature(s) Signed: 01/20/2022 3:37:08 PM By: [name] Entered By: Rhae Hammock on 01/14/2022 10:31:59

## 2022-01-20 NOTE — Progress Notes (Signed)
[name], [name] ([id number]) Visit Report for 01/14/2022 Chief Complaint Document Details Patient Name: Date of Service: [name], [name] 01/14/2022 10:30 A M Medical Record Number: [id number] Patient Account Number: [id number] Date of Birth/Sex: Treating RN: 19-Nov-1925 (88 y.o. F) Primary Care Provider: Marda Stalker Other Clinician: Referring Provider: Treating Provider/Extender: Aviva Signs in Treatment: 6 Information Obtained from: Patient Chief Complaint Left lower extremity wound Electronic Signature(s) Signed: 01/14/2022 11:09:02 AM By: Kalman Shan DO Entered By: Kalman Shan on 01/14/2022 11:02:04 -------------------------------------------------------------------------------- Debridement Details Patient Name: Date of Service: [name], WHETSEL [name] 01/14/2022 10:30 A M Medical Record Number: [id number] Patient Account Number: [id number] Date of Birth/Sex: Treating RN: [birth year] (88 y.o. [name], [name]: Marda Stalker Other Clinician: Referring Provider: Treating Provider/Extender: Aviva Signs in Treatment: 6 Debridement Performed for Assessment: Wound #26 Left,Lateral Ankle Performed By: Physician Kalman Shan, DO Debridement Type: Debridement Severity of Tissue Pre Debridement: Fat layer exposed Level of Consciousness (Pre-procedure): Awake and Alert Pre-procedure Verification/Time Out Yes - 10:54 Taken: Start Time: 10:54 Pain Control: Lidocaine T Area Debrided (L x W): otal 4.3 (cm) x 3.5 (cm) = 15.05 (cm) Tissue and other material debrided: Viable, Non-Viable, Slough, Subcutaneous, Slough Level: Skin/Subcutaneous Tissue Debridement Description: Excisional Instrument: Curette Bleeding: Minimum Hemostasis Achieved: Silver Nitrate End Time: 10:54 Procedural Pain: 0 Post Procedural Pain: 0 Response to Treatment: Procedure was tolerated well Level of  Consciousness (Post- Awake and Alert procedure): Post Debridement Measurements of Total Wound Length: (cm) 4.3 Width: (cm) 3.5 Depth: (cm) 0.2 Volume: (cm) 2.364 Character of Wound/Ulcer Post Debridement: Improved Severity of Tissue Post Debridement: Fat layer exposed Post Procedure Diagnosis Same as Pre-procedure Electronic Signature(s) Signed: 01/14/2022 11:09:02 AM By: Kalman Shan DO Signed: 01/20/2022 3:37:08 PM By: [name] Entered By: Rhae Hammock on 01/14/2022 10:55:36 -------------------------------------------------------------------------------- HPI Details Patient Name: Date of Service: [name], SASAKI [name] 01/14/2022 10:30 A M Medical Record Number: [id number] Patient Account Number: [id number] Date of Birth/Sex: Treating RN: 03/17/1925 (88 y.o. F) Primary Care Provider: Marda Stalker Other Clinician: Referring Provider: Treating Provider/Extender: Aviva Signs in Treatment: 6 History of Present Illness HPI Description: this is a patient we know from several prior wounds on her bilateral lower extremities. She has venous stasis physiology, inflammation and hypertension. She is been fully evaluated for venous ablation and was found not to be a candidate. She does not have significant arterial disease. 02/14/15 the wound itself on her lateral left leg did not look too bad however there was surrounding maceration which was concerning 02/27/15 the wound itself is small with surrounding circumferential epithelialization there is no surrounding maceration. 03/06/15 only a very small area remains. I think this is mostly epithelialized however I would be uncomfortable not dressing this this week 03/13/15 the area is epithelialized. There was a thick surface on this. I took a #15 blade then lightly disrupted it but there does not appear to be any open area I did not see anything but appropriate epithelium Readmission: 08/16/18 on  evaluation today patient presents for initial evaluation and our clinic released readmission although she has not been seen since April 2016. Nonetheless she is having issues with the ulcers currently on the bilateral lateral malleolus or locations as well as the left lateral lower extremity. These have been present for roughly 3 weeks since the patient actually developed significant bilateral lower extremity edema which patient's daughter states was roughly 3 times the size of her legs currently. Subsequently she ended up in  the emerging department due to congestive heart failure. They were able to get the swelling under control and fortunately she seems to be doing much better at this point in time. She was placed in an [employer] which she sat on for week part evaluation today as well. Fortunately the wound areas actually seem to be doing fairly well there is some macerated skin surrounding there are the areas/surface of the wound which are gonna require some debridement at this point. No fevers, chills, nausea, or vomiting noted at this time. Patient has a history of hypertension, mild peripheral vascular disease, chronic venous stasis, and other than the ulcer seems to be doing fairly well today. No fevers, chills, nausea, or vomiting noted at this time. 08/23/18 on evaluation today patient actually appears to be doing great in regard to her bilateral lower should be ulcers. She's shown signs of improvement even just with one week with the [employer]. In general I'm actually very pleased with how things appear at this point. 09/06/18 on evaluation today patient actually appears to be doing much better her left lower extremity is completely healed. Go to the right lower extremity lateral malleolus ulcer this still seems to be showing signs of being open. There does not appear to be evidence of infection which is good news. This did require some sharp debridement however to remove some of  the necrotic tissue/eschar on the surface of the wound Readmission 11/27/2021 [name] is a 88 year old female with a past medical history of endometrial and breast cancer, chronic venous insufficiency, dementia and essential hypertension that presents to the clinic with a 1 month history of nonhealing ulcer to the left lower extremity. She states that the ulcer opened 1 month ago and then healed but has reopened again in the past week. She is currently keeping the area covered. She has a history of wounds to her lower extremities. She has compression stockings that she uses daily. She currently denies signs of infection. 1/13; patient presents for follow-up. She followed up for nurse visit for wrap change. She reports more odor. She denies pain. 1/20; patient presents for follow-up. She has been using gentamicin ointment with calcium alginate to the wound bed. She reports improvement in odor and drainage. She currently denies systemic signs of infection. 1/27; patient presents for follow-up. She has been using gentamicin ointment with calcium alginate. She received her Keystone antibiotic 2 days ago. She brought this in. Home health has also been established. They will come out for the first time next week. She currently denies signs of infection. She has no issues or complaints today. 2/2; patient presents for follow-up. She has been using Keystone antibiotics with [facility] with no issues. She denies signs of infection. She has no issues or complaints today. 2/9; patient presents for follow-up. She has been using Keystone antibiotics with [facility] with no issues. She has tolerated the compression wrap well. She denies signs of infection. 2/16; patient presents for follow-up. She has been using Keystone antibiotics with Hydrofera Blue under compression wrap with no issues. She currently denies signs of infection. 2/23; patient presents for follow-up. She continues to use  Keystone antibiotics with Hydrofera Blue under the compression wrap. She has no issues or complaints today. She denies signs of infection. Home health continues to come out and change the wrap. Electronic Signature(s) Signed: 01/14/2022 11:09:02 AM By: Kalman Shan DO Entered By: Kalman Shan on 01/14/2022 11:02:45 -------------------------------------------------------------------------------- Physical Exam Details Patient Name: Date  of Service: DAN, DISSINGER [name] 01/14/2022 10:30 A M Medical Record Number: [id number] Patient Account Number: [id number] Date of Birth/Sex: Treating RN: 07/15/1925 (88 y.o. F) Primary Care Provider: Marda Stalker Other Clinician: Referring Provider: Treating Provider/Extender: Marjo Bicker Weeks in Treatment: 6 Constitutional respirations regular, non-labored and within target range for patient.. Cardiovascular 2+ dorsalis pedis/posterior tibialis pulses. Psychiatric pleasant and cooperative. Notes Left lower extremity: T the distal lateral aspect there is an open wound with granulation tissue and nonviable tissue. Some areas of hyper granulated. Appears o well-healing. No signs of surrounding infection. Electronic Signature(s) Signed: 01/14/2022 11:09:02 AM By: Kalman Shan DO Entered By: Kalman Shan on 01/14/2022 11:05:49 -------------------------------------------------------------------------------- Physician Orders Details Patient Name: Date of Service: LAFAWN, LENOIR [name] 01/14/2022 10:30 A M Medical Record Number: [id number] Patient Account Number: [id number] Date of Birth/Sex: Treating RN: May 29, 1925 (88 y.o. [name], [name]: Marda Stalker Other Clinician: Referring Provider: Treating Provider/Extender: Aviva Signs in Treatment: 6 Verbal / Phone Orders: No Diagnosis Coding Follow-up Appointments ppointment in 1 week. - Dr. Heber [place] and  Allayne Butcher, RN: Room # 9 Return A ***Continue to bring keystone topical antibiotic medication to each visit.*** Bathing/ Shower/ Hygiene May shower with protection but do not get wound dressing(s) wet. - use cast protector. May shower and wash wound with soap and water. - with dressing changes may wash with soap and water. Edema Control - Lymphedema / SCD / Other Elevate legs to the level of the heart or above for 30 minutes daily and/or when sitting, a frequency of: Avoid standing for long periods of time. Patient to wear own compression stockings every day. - apply in the morning and remove at night to right leg. Exercise regularly Moisturize legs daily. - every night before day right leg. Additional Orders / Instructions Follow Nutritious Diet - High Protein Diet Home Health No change in wound care orders this week; continue Home Health for wound care. May utilize formulary equivalent dressing for wound treatment orders unless otherwise specified. - Home Health for skilled wound care nursing once a week and wound center weekly. Apply keystone topical antibiotics and hydrofera blue classic as primary dressing. NO ZINC OXIDE!! Other Home Health Orders/Instructions: [facility] home health Wound Treatment Wound #26 - Ankle Wound Laterality: Left, Lateral Cleanser: Soap and Water (Home Health) 2 x Per Week/30 Days Discharge Instructions: May shower and wash wound with dial antibacterial soap and water prior to dressing change. Peri-Wound Care: Sween Lotion (Moisturizing lotion) (Home Health) 2 x Per Week/30 Days Discharge Instructions: Apply moisturizing lotion as directed Topical: Keystone topica Antibiotics (Home Health) 2 x Per Week/30 Days Discharge Instructions: Mix medication according to pharmacy instruction apply to wound bed. Prim Dressing: Hydrofera Blue Classic Foam, 4x4 in (Home Health) 2 x Per Week/30 Days ary Discharge Instructions: Moisten with saline apply over the [facility]  medication. Secondary Dressing: Zetuvit Plus 4x8 in [facility]) 2 x Per Week/30 Days Discharge Instructions: Apply over primary dressing as directed. Compression Wrap: ThreePress (3 layer compression wrap) (Home Health) 2 x Per Week/30 Days Discharge Instructions: Apply three layer compression as directed. Electronic Signature(s) Signed: 01/14/2022 11:09:02 AM By: Kalman Shan DO Entered By: Kalman Shan on 01/14/2022 11:06:11 -------------------------------------------------------------------------------- Problem List Details Patient Name: Date of Service: ALISA, STJAMES [name] 01/14/2022 10:30 A M Medical Record Number: [id number] Patient Account Number: [id number] Date of Birth/Sex: Treating RN: 1925/11/09 (88 y.o. F) Primary Care Provider: Marda Stalker Other Clinician: Referring Provider: Treating Provider/Extender: Marjo Bicker  Weeks in Treatment: 6 Active Problems ICD-10 Encounter Code Description Active Date MDM Diagnosis I87.312 Chronic venous hypertension (idiopathic) with ulcer of left lower extremity 11/27/2021 No Yes L97.822 Non-pressure chronic ulcer of other part of left lower leg with fat layer exposed1/04/2022 No Yes C50.919 Malignant neoplasm of unspecified site of unspecified female breast 11/27/2021 No Yes I50.42 Chronic combined systolic (congestive) and diastolic (congestive) heart failure 11/27/2021 No Yes G30.9 Alzheimer's disease, unspecified 11/27/2021 No Yes C54.1 Malignant neoplasm of endometrium 11/27/2021 No Yes Inactive Problems Resolved Problems Electronic Signature(s) Signed: 01/14/2022 11:09:02 AM By: Kalman Shan DO Entered By: Kalman Shan on 01/14/2022 11:01:43 -------------------------------------------------------------------------------- Progress Note Details Patient Name: Date of Service: JERIANNE, ANSELMO [name] 01/14/2022 10:30 A M Medical Record Number: [id number] Patient Account Number: [id number] Date of  Birth/Sex: Treating RN: [birth year] (88 y.o. F) Primary Care Provider: Marda Stalker Other Clinician: Referring Provider: Treating Provider/Extender: Aviva Signs in Treatment: 6 Subjective Chief Complaint Information obtained from Patient Left lower extremity wound History of Present Illness (HPI) this is a patient we know from several prior wounds on her bilateral lower extremities. She has venous stasis physiology, inflammation and hypertension. She is been fully evaluated for venous ablation and was found not to be a candidate. She does not have significant arterial disease. 02/14/15 the wound itself on her lateral left leg did not look too bad however there was surrounding maceration which was concerning 02/27/15 the wound itself is small with surrounding circumferential epithelialization there is no surrounding maceration. 03/06/15 only a very small area remains. I think this is mostly epithelialized however I would be uncomfortable not dressing this this week 03/13/15 the area is epithelialized. There was a thick surface on this. I took a #15 blade then lightly disrupted it but there does not appear to be any open area I did not see anything but appropriate epithelium Readmission: 08/16/18 on evaluation today patient presents for initial evaluation and our clinic released readmission although she has not been seen since April 2016. Nonetheless she is having issues with the ulcers currently on the bilateral lateral malleolus or locations as well as the left lateral lower extremity. These have been present for roughly 3 weeks since the patient actually developed significant bilateral lower extremity edema which patient's daughter states was roughly 3 times the size of her legs currently. Subsequently she ended up in the emerging department due to congestive heart failure. They were able to get the swelling under control and fortunately she seems to be doing much  better at this point in time. She was placed in an [employer] which she sat on for week part evaluation today as well. Fortunately the wound areas actually seem to be doing fairly well there is some macerated skin surrounding there are the areas/surface of the wound which are gonna require some debridement at this point. No fevers, chills, nausea, or vomiting noted at this time. Patient has a history of hypertension, mild peripheral vascular disease, chronic venous stasis, and other than the ulcer seems to be doing fairly well today. No fevers, chills, nausea, or vomiting noted at this time. 08/23/18 on evaluation today patient actually appears to be doing great in regard to her bilateral lower should be ulcers. She's shown signs of improvement even just with one week with the [employer]. In general I'm actually very pleased with how things appear at this point. 09/06/18 on evaluation today patient actually appears to be doing much better her left lower extremity is  completely healed. Go to the right lower extremity lateral malleolus ulcer this still seems to be showing signs of being open. There does not appear to be evidence of infection which is good news. This did require some sharp debridement however to remove some of the necrotic tissue/eschar on the surface of the wound Readmission 11/27/2021 Ms. Pricsilla Lindvall is a 88 year old female with a past medical history of endometrial and breast cancer, chronic venous insufficiency, dementia and essential hypertension that presents to the clinic with a 1 month history of nonhealing ulcer to the left lower extremity. She states that the ulcer opened 1 month ago and then healed but has reopened again in the past week. She is currently keeping the area covered. She has a history of wounds to her lower extremities. She has compression stockings that she uses daily. She currently denies signs of infection. 1/13; patient presents for follow-up. She  followed up for nurse visit for wrap change. She reports more odor. She denies pain. 1/20; patient presents for follow-up. She has been using gentamicin ointment with calcium alginate to the wound bed. She reports improvement in odor and drainage. She currently denies systemic signs of infection. 1/27; patient presents for follow-up. She has been using gentamicin ointment with calcium alginate. She received her Keystone antibiotic 2 days ago. She brought this in. Home health has also been established. They will come out for the first time next week. She currently denies signs of infection. She has no issues or complaints today. 2/2; patient presents for follow-up. She has been using Keystone antibiotics with [facility] with no issues. She denies signs of infection. She has no issues or complaints today. 2/9; patient presents for follow-up. She has been using Keystone antibiotics with [facility] with no issues. She has tolerated the compression wrap well. She denies signs of infection. 2/16; patient presents for follow-up. She has been using Keystone antibiotics with Hydrofera Blue under compression wrap with no issues. She currently denies signs of infection. 2/23; patient presents for follow-up. She continues to use Keystone antibiotics with Hydrofera Blue under the compression wrap. She has no issues or complaints today. She denies signs of infection. Home health continues to come out and change the wrap. Patient History Information obtained from Patient. Family History Cancer - Child, Hypertension - Child,Father, Kidney Disease - Child, Stroke - Siblings,Child, Thyroid Problems - Child, No family history of Diabetes, Heart Disease, Hereditary Spherocytosis, Lung Disease, Seizures, Tuberculosis. Social History Never smoker, Marital Status - Widowed, Alcohol Use - Never, Drug Use - No History, Caffeine Use - Never. Medical History Eyes Denies history of Cataracts, Glaucoma, Optic  Neuritis Ear/Nose/Mouth/Throat Denies history of Chronic sinus problems/congestion, Middle ear problems Hematologic/Lymphatic Patient has history of Anemia Denies history of Hemophilia, Human Immunodeficiency Virus, Lymphedema, Sickle Cell Disease Respiratory Denies history of Aspiration, Asthma, Chronic Obstructive Pulmonary Disease (COPD), Pneumothorax, Sleep Apnea, Tuberculosis Cardiovascular Patient has history of Congestive Heart Failure, Hypertension, Peripheral Venous Disease Denies history of Angina, Arrhythmia, Coronary Artery Disease, Hypotension, Myocardial Infarction, Peripheral Arterial Disease, Phlebitis, Vasculitis Gastrointestinal Denies history of Cirrhosis , Colitis, Crohnoos, Hepatitis A, Hepatitis B, Hepatitis C Endocrine Denies history of Type I Diabetes, Type II Diabetes Genitourinary Denies history of End Stage Renal Disease Immunological Denies history of Lupus Erythematosus, Raynaudoos, Scleroderma Integumentary (Skin) Denies history of History of Burn Musculoskeletal Patient has history of Osteoarthritis Denies history of Gout, Rheumatoid Arthritis, Osteomyelitis Neurologic Denies history of Neuropathy, Quadriplegia, Paraplegia, Seizure Disorder Oncologic Patient has history of Received Chemotherapy -  Breast and Ovarian Cancer Denies history of Received Radiation Hospitalization/Surgery History - diverticulits. Objective Constitutional respirations regular, non-labored and within target range for patient.. Vitals Time Taken: 10:31 AM, Height: 66 in, Weight: 176 lbs, BMI: 28.4, Temperature: 98.1 F, Pulse: 65 bpm, Respiratory Rate: 17 breaths/min, Blood Pressure: 118/70 mmHg. Cardiovascular 2+ dorsalis pedis/posterior tibialis pulses. Psychiatric pleasant and cooperative. General Notes: Left lower extremity: T the distal lateral aspect there is an open wound with granulation tissue and nonviable tissue. Some areas of hyper o granulated. Appears  well-healing. No signs of surrounding infection. Integumentary (Hair, Skin) Wound #26 status is Open. Original cause of wound was Gradually Appeared. The date acquired was: 11/20/2021. The wound has been in treatment 6 weeks. The wound is located on the Left,Lateral Ankle. The wound measures 4.3cm length x 3.5cm width x 0.2cm depth; 11.82cm^2 area and 2.364cm^3 volume. There is Fat Layer (Subcutaneous Tissue) exposed. There is no tunneling or undermining noted. There is a medium amount of serosanguineous drainage noted. The wound margin is distinct with the outline attached to the wound base. There is large (67-100%) red granulation within the wound bed. There is a small (1-33%) amount of necrotic tissue within the wound bed including Adherent Slough. Assessment Active Problems ICD-10 Chronic venous hypertension (idiopathic) with ulcer of left lower extremity Non-pressure chronic ulcer of other part of left lower leg with fat layer exposed Malignant neoplasm of unspecified site of unspecified female breast Chronic combined systolic (congestive) and diastolic (congestive) heart failure Alzheimer's disease, unspecified Malignant neoplasm of endometrium Patient's wound has shown improvement in size and appearance since last clinic visit. I debrided nonviable tissue. No signs of surrounding infection. I recommended continuing with Keystone antibiotic and Hydrofera Blue under compression therapy. Follow-up in 1 week Procedures Wound #26 Pre-procedure diagnosis of Wound #26 is a Venous Leg Ulcer located on the Left,Lateral Ankle .Severity of Tissue Pre Debridement is: Fat layer exposed. There was a Excisional Skin/Subcutaneous Tissue Debridement with a total area of 15.05 sq cm performed by Kalman Shan, DO. With the following instrument(s): Curette to remove Viable and Non-Viable tissue/material. Material removed includes Subcutaneous Tissue and Slough and after achieving pain control using  Lidocaine. No specimens were taken. A time out was conducted at 10:54, prior to the start of the procedure. A Minimum amount of bleeding was controlled with Silver Nitrate. The procedure was tolerated well with a pain level of 0 throughout and a pain level of 0 following the procedure. Post Debridement Measurements: 4.3cm length x 3.5cm width x 0.2cm depth; 2.364cm^3 volume. Character of Wound/Ulcer Post Debridement is improved. Severity of Tissue Post Debridement is: Fat layer exposed. Post procedure Diagnosis Wound #26: Same as Pre-Procedure Plan Follow-up Appointments: Return Appointment in 1 week. - Dr. Heber [place] and Allayne Butcher, RN: Room # 9 ***Continue to bring keystone topical antibiotic medication to each visit.*** Bathing/ Shower/ Hygiene: May shower with protection but do not get wound dressing(s) wet. - use cast protector. May shower and wash wound with soap and water. - with dressing changes may wash with soap and water. Edema Control - Lymphedema / SCD / Other: Elevate legs to the level of the heart or above for 30 minutes daily and/or when sitting, a frequency of: Avoid standing for long periods of time. Patient to wear own compression stockings every day. - apply in the morning and remove at night to right leg. Exercise regularly Moisturize legs daily. - every night before day right leg. Additional Orders / Instructions: Follow Nutritious Diet - High  Protein Diet Home Health: No change in wound care orders this week; continue Home Health for wound care. May utilize formulary equivalent dressing for wound treatment orders unless otherwise specified. - Home Health for skilled wound care nursing once a week and wound center weekly. Apply keystone topical antibiotics and hydrofera blue classic as primary dressing. NO ZINC OXIDE!! Other Home Health Orders/Instructions: [facility] WOUND #26: - Ankle Wound Laterality: Left, Lateral Cleanser: Soap and Water (Home Health) 2 x Per  Week/30 Days Discharge Instructions: May shower and wash wound with dial antibacterial soap and water prior to dressing change. Peri-Wound Care: Sween Lotion (Moisturizing lotion) (Home Health) 2 x Per Week/30 Days Discharge Instructions: Apply moisturizing lotion as directed Topical: Keystone topica Antibiotics (Home Health) 2 x Per Week/30 Days Discharge Instructions: Mix medication according to pharmacy instruction apply to wound bed. Prim Dressing: Hydrofera Blue Classic Foam, 4x4 in (Home Health) 2 x Per Week/30 Days ary Discharge Instructions: Moisten with saline apply over the [facility] medication. Secondary Dressing: Zetuvit Plus 4x8 in [facility]) 2 x Per Week/30 Days Discharge Instructions: Apply over primary dressing as directed. Com pression Wrap: ThreePress (3 layer compression wrap) (Home Health) 2 x Per Week/30 Days Discharge Instructions: Apply three layer compression as directed. 1. In office sharp debridement 2. Hydrofera Blue and Keystone under 3 layer compression 3. Follow-up in 1 week Electronic Signature(s) Signed: 01/14/2022 11:09:02 AM By: Kalman Shan DO Entered By: Kalman Shan on 01/14/2022 11:08:13 -------------------------------------------------------------------------------- HxROS Details Patient Name: Date of Service: SHAELYN, DECARLI [name] 01/14/2022 10:30 A M Medical Record Number: [id number] Patient Account Number: [id number] Date of Birth/Sex: Treating RN: 05-20-1925 (88 y.o. F) Primary Care Provider: Marda Stalker Other Clinician: Referring Provider: Treating Provider/Extender: Aviva Signs in Treatment: 6 Information Obtained From Patient Eyes Medical History: Negative for: Cataracts; Glaucoma; Optic Neuritis Ear/Nose/Mouth/Throat Medical History: Negative for: Chronic sinus problems/congestion; Middle ear problems Hematologic/Lymphatic Medical History: Positive for: Anemia Negative for: Hemophilia;  Human Immunodeficiency Virus; Lymphedema; Sickle Cell Disease Respiratory Medical History: Negative for: Aspiration; Asthma; Chronic Obstructive Pulmonary Disease (COPD); Pneumothorax; Sleep Apnea; Tuberculosis Cardiovascular Medical History: Positive for: Congestive Heart Failure; Hypertension; Peripheral Venous Disease Negative for: Angina; Arrhythmia; Coronary Artery Disease; Hypotension; Myocardial Infarction; Peripheral Arterial Disease; Phlebitis; Vasculitis Gastrointestinal Medical History: Negative for: Cirrhosis ; Colitis; Crohns; Hepatitis A; Hepatitis B; Hepatitis C Endocrine Medical History: Negative for: Type I Diabetes; Type II Diabetes Genitourinary Medical History: Negative for: End Stage Renal Disease Immunological Medical History: Negative for: Lupus Erythematosus; Raynauds; Scleroderma Integumentary (Skin) Medical History: Negative for: History of Burn Musculoskeletal Medical History: Positive for: Osteoarthritis Negative for: Gout; Rheumatoid Arthritis; Osteomyelitis Neurologic Medical History: Negative for: Neuropathy; Quadriplegia; Paraplegia; Seizure Disorder Oncologic Medical History: Positive for: Received Chemotherapy - Breast and Ovarian Cancer Negative for: Received Radiation Immunizations Pneumococcal Vaccine: Received Pneumococcal Vaccination: Yes Received Pneumococcal Vaccination On or After 60th Birthday: Yes Implantable Devices None Hospitalization / Surgery History Type of Hospitalization/Surgery diverticulits Family and Social History Cancer: Yes - Child; Diabetes: No; Heart Disease: No; Hereditary Spherocytosis: No; Hypertension: Yes - Child,Father; Kidney Disease: Yes - Child; Lung Disease: No; Seizures: No; Stroke: Yes - Siblings,Child; Thyroid Problems: Yes - Child; Tuberculosis: No; Never smoker; Marital Status - Widowed; Alcohol Use: Never; Drug Use: No History; Caffeine Use: Never; Financial Concerns: No; Food, Clothing or  Shelter Needs: No; Support System Lacking: No; Transportation Concerns: No Electronic Signature(s) Signed: 01/14/2022 11:09:02 AM By: Kalman Shan DO Entered By: Kalman Shan on 01/14/2022 11:04:48 -------------------------------------------------------------------------------- SuperBill Details  Patient Name: Date of Service: KANDANCE, YANO 01/14/2022 Medical Record Number: [id number] Patient Account Number: [id number] Date of Birth/Sex: Treating RN: [birth year] (88 y.o. F) Primary Care Provider: Marda Stalker Other Clinician: Referring Provider: Treating Provider/Extender: Aviva Signs in Treatment: 6 Diagnosis Coding ICD-10 Codes Code Description [phone] Chronic venous hypertension (idiopathic) with ulcer of left lower extremity L97.822 Non-pressure chronic ulcer of other part of left lower leg with fat layer exposed C50.919 Malignant neoplasm of unspecified site of unspecified female breast I50.42 Chronic combined systolic (congestive) and diastolic (congestive) heart failure G30.9 Alzheimer's disease, unspecified C54.1 Malignant neoplasm of endometrium Facility Procedures CPT4 Code: [id number] Description: 80165 - DEB SUBQ TISSUE 20 SQ CM/< ICD-10 Diagnosis Description L97.822 Non-pressure chronic ulcer of other part of left lower leg with fat layer expo I87.312 Chronic venous hypertension (idiopathic) with ulcer of left lower extremity Modifier: sed Quantity: 1 Physician Procedures : CPT4 Code Description Modifier [id number] - WC PHYS SUBQ TISS 20 SQ CM 1 ICD-10 Diagnosis Description L97.822 Non-pressure chronic ulcer of other part of left lower leg with fat layer exposed I87.312 Chronic venous hypertension (idiopathic) with  ulcer of left lower extremity Quantity: Electronic Signature(s) Signed: 01/14/2022 11:09:02 AM By: Kalman Shan DO Entered By: Kalman Shan on 01/14/2022 11:08:37

## 2022-01-21 ENCOUNTER — Other Ambulatory Visit: Payer: Self-pay

## 2022-01-21 ENCOUNTER — Encounter (HOSPITAL_BASED_OUTPATIENT_CLINIC_OR_DEPARTMENT_OTHER): Payer: Medicare PPO | Attending: Internal Medicine | Admitting: Internal Medicine

## 2022-01-21 DIAGNOSIS — C50919 Malignant neoplasm of unspecified site of unspecified female breast: Secondary | ICD-10-CM | POA: Insufficient documentation

## 2022-01-21 DIAGNOSIS — C541 Malignant neoplasm of endometrium: Secondary | ICD-10-CM | POA: Insufficient documentation

## 2022-01-21 DIAGNOSIS — G309 Alzheimer's disease, unspecified: Secondary | ICD-10-CM | POA: Insufficient documentation

## 2022-01-21 DIAGNOSIS — L97822 Non-pressure chronic ulcer of other part of left lower leg with fat layer exposed: Secondary | ICD-10-CM | POA: Insufficient documentation

## 2022-01-21 DIAGNOSIS — I87312 Chronic venous hypertension (idiopathic) with ulcer of left lower extremity: Secondary | ICD-10-CM | POA: Diagnosis not present

## 2022-01-21 DIAGNOSIS — I5042 Chronic combined systolic (congestive) and diastolic (congestive) heart failure: Secondary | ICD-10-CM | POA: Insufficient documentation

## 2022-01-21 DIAGNOSIS — I509 Heart failure, unspecified: Secondary | ICD-10-CM | POA: Insufficient documentation

## 2022-01-21 DIAGNOSIS — I110 Hypertensive heart disease with heart failure: Secondary | ICD-10-CM | POA: Diagnosis not present

## 2022-01-21 DIAGNOSIS — Z9221 Personal history of antineoplastic chemotherapy: Secondary | ICD-10-CM | POA: Insufficient documentation

## 2022-01-21 NOTE — Progress Notes (Signed)
OMEKA, HOLBEN ([id number]) Visit Report for 01/21/2022 Chief Complaint Document Details Patient Name: Date of Service: [name], [name] 01/21/2022 10:00 A M Medical Record [name]: [id number] Patient Account [name]: [id number] Date of Birth/Sex: Treating RN: Jul 19, 1925 (88 y.o. [name] Primary Care Provider: Marda [name] Other Clinician: Referring Provider: Treating Provider/Extender: [name]: 7 Information Obtained from: Patient Chief Complaint Left lower extremity wound Electronic Signature(s) Signed: 01/21/2022 12:38:40 PM By: [name] Shan DO Entered By: [name] on 01/21/2022 11:25:57 -------------------------------------------------------------------------------- Debridement Details Patient Name: Date of Service: [name], [name] 01/21/2022 10:00 A M Medical Record [name]: [id number] Patient Account [name]: [id number] Date of Birth/Sex: Treating RN: 10-28-1925 (88 y.o. [name] Primary Care Provider: Marda [name] Other Clinician: Referring Provider: Treating Provider/Extender: [name]: 7 Debridement Performed for Assessment: Wound #26 Left,Lateral Ankle Performed By: Physician [name] Shan, DO Debridement Type: Debridement Severity of Tissue Pre Debridement: Fat layer exposed Level of Consciousness (Pre-procedure): Awake and Alert Pre-procedure Verification/Time Out Yes - 10:55 Taken: Start Time: 10:55 Pain Control: Lidocaine T Area Debrided (L x W): otal 4.2 (cm) x 3.4 (cm) = 14.28 (cm) Tissue and other material debrided: Viable, Non-Viable, Slough, Subcutaneous, Skin: Dermis , Skin: Epidermis, Slough Level: Skin/Subcutaneous Tissue Debridement Description: Excisional Instrument: Curette Bleeding: Minimum Hemostasis Achieved: Pressure End Time: 10:55 Procedural Pain: 0 Post Procedural Pain: 0 Response to [name]:  Procedure was tolerated well Level of Consciousness (Post- Awake and Alert procedure): Post Debridement Measurements of Total Wound Length: (cm) 4.2 Width: (cm) 3.4 Depth: (cm) 0.2 Volume: (cm) 2.243 Character of Wound/Ulcer Post Debridement: Improved Severity of Tissue Post Debridement: Fat layer exposed Post Procedure Diagnosis Same as Pre-procedure Electronic Signature(s) Signed: 01/21/2022 12:38:40 PM By: [name] Shan DO Signed: 01/21/2022 4:17:36 PM By: [name] Hammock RN Entered By: [name] on 01/21/2022 10:56:24 -------------------------------------------------------------------------------- HPI Details Patient Name: Date of Service: [name], [name] 01/21/2022 10:00 [name]: [id number] Patient Account [name]: [id number] Date of Birth/Sex: Treating RN: 07-30-1925 (88 y.o. [name] Primary Care Provider: Marda [name] Other Clinician: Referring Provider: Treating Provider/Extender: [name]: 7 History of Present Illness HPI Description: this is a patient we know from several prior wounds on her bilateral lower extremities. She has venous stasis physiology, inflammation and hypertension. She is been fully evaluated for venous ablation and was found not to be a candidate. She does not have significant arterial disease. 02/14/15 the wound itself on her lateral left leg did not look too bad however there was surrounding maceration which was concerning 02/27/15 the wound itself is small with surrounding circumferential epithelialization there is no surrounding maceration. 03/06/15 only a very small area remains. I think this is mostly epithelialized however I would be uncomfortable not dressing this this week 03/13/15 the area is epithelialized. There was a thick surface on this. I took a #15 blade then lightly disrupted it but there does not appear to be any open area I did not see anything but  appropriate epithelium Readmission: 08/16/18 on evaluation today patient presents for initial evaluation and our clinic released readmission although she has not been seen since April 2016. Nonetheless she is having issues with the ulcers currently on the bilateral lateral malleolus or locations as well as the left lateral lower extremity. These have been present for roughly 3 weeks since the patient actually developed significant bilateral lower extremity edema which patient's daughter states was roughly 3 times the size of  her legs currently. Subsequently she ended up in the emerging department due to congestive heart failure. They were able to get the swelling under control and fortunately she seems to be doing much better at this point in time. She was placed in an [employer] which she sat on for week part evaluation today as well. Fortunately the wound areas actually seem to be doing fairly well there is some macerated skin surrounding there are the areas/surface of the wound which are gonna require some debridement at this point. No fevers, chills, nausea, or vomiting noted at this time. Patient has a history of hypertension, mild peripheral vascular disease, chronic venous stasis, and other than the ulcer seems to be doing fairly well today. No fevers, chills, nausea, or vomiting noted at this time. 08/23/18 on evaluation today patient actually appears to be doing great in regard to her bilateral lower should be ulcers. She's shown signs of improvement even just with one week with the [employer]. In general I'm actually very pleased with how things appear at this point. 09/06/18 on evaluation today patient actually appears to be doing much better her left lower extremity is completely healed. Go to the right lower extremity lateral malleolus ulcer this still seems to be showing signs of being open. There does not appear to be evidence of infection which is good news. This did  require some sharp debridement however to remove some of the necrotic tissue/eschar on the surface of the wound Readmission 11/27/2021 [name] is a 88 year old female with a past medical history of endometrial and breast cancer, chronic venous insufficiency, dementia and essential hypertension that presents to the clinic with a 1 month history of nonhealing ulcer to the left lower extremity. She states that the ulcer opened 1 month ago and then healed but has reopened again in the past week. She is currently keeping the area covered. She has a history of wounds to her lower extremities. She has compression stockings that she uses daily. She currently denies signs of infection. 1/13; patient presents for follow-up. She followed up for nurse visit for wrap change. She reports more odor. She denies pain. 1/20; patient presents for follow-up. She has been using gentamicin ointment with calcium alginate to the wound bed. She reports improvement in odor and drainage. She currently denies systemic signs of infection. 1/27; patient presents for follow-up. She has been using gentamicin ointment with calcium alginate. She received her Keystone antibiotic 2 days ago. She brought this in. Home health has also been established. They will come out for the first time next week. She currently denies signs of infection. She has no issues or complaints today. 2/2; patient presents for follow-up. She has been using Keystone antibiotics with [facility] with no issues. She denies signs of infection. She has no issues or complaints today. 2/9; patient presents for follow-up. She has been using Keystone antibiotics with [facility] with no issues. She has tolerated the compression wrap well. She denies signs of infection. 2/16; patient presents for follow-up. She has been using Keystone antibiotics with Hydrofera Blue under compression wrap with no issues. She currently denies signs of infection. 2/23;  patient presents for follow-up. She continues to use Keystone antibiotics with Hydrofera Blue under the compression wrap. She has no issues or complaints today. She denies signs of infection. Home health continues to come out and change the wrap. 3/2; patient presents for follow-up. She continues to use Keystone with [facility] under compression with  no issues. She denies signs of infection. Electronic Signature(s) Signed: 01/21/2022 12:38:40 PM By: [name] Shan DO Entered By: [name] on 01/21/2022 11:28:01 -------------------------------------------------------------------------------- Physical Exam Details Patient Name: Date of Service: [name], [name] 01/21/2022 10:00 A M Medical Record [name]: [id number] Patient Account [name]: [id number] Date of Birth/Sex: Treating RN: [birth year] (88 y.o. [name] Primary Care Provider: Marda [name] Other Clinician: Referring Provider: Treating Provider/Extender: Marjo Bicker Weeks in [name]: 7 Constitutional respirations regular, non-labored and within target range for patient.. Cardiovascular 2+ dorsalis pedis/posterior tibialis pulses. Psychiatric pleasant and cooperative. Notes Well-healingLeft lower extremity: T the distal lateral aspect there is an open wound with granulation tissue and nonviable tissue.. No surrounding signs of o infection. Electronic Signature(s) Signed: 01/21/2022 12:38:40 PM By: [name] Shan DO Entered By: [name] on 01/21/2022 11:32:31 -------------------------------------------------------------------------------- Physician Orders Details Patient Name: Date of Service: [name], [name] 01/21/2022 10:00 A M Medical Record [name]: [id number] Patient Account [name]: [id number] Date of Birth/Sex: Treating RN: [birth year] (88 y.o. [name] Primary Care Provider: Marda [name] Other Clinician: Referring Provider: Treating  Provider/Extender: [name]: 7 Verbal / Phone Orders: No Diagnosis Coding Follow-up Appointments ppointment in 1 week. - Dr. Heber [place]se[place] and Allayne Butcher, RN: Room # 9 Return A ***Continue to bring keystone topical antibiotic medication to each visit.*** Bathing/ Shower/ Hygiene May shower with protection but do not get wound dressing(s) wet. - use cast protector. May shower and wash wound with soap and water. - with dressing changes may wash with soap and water. Edema Control - Lymphedema / SCD / Other Elevate legs to the level of the heart or above for 30 minutes daily and/or when sitting, a frequency of: Avoid standing for long periods of time. Patient to wear own compression stockings every day. - apply in the morning and remove at night to right leg. Exercise regularly Moisturize legs daily. - every night before day right leg. Additional Orders / Instructions Follow Nutritious Diet - High Protein Diet Home Health No change in wound care orders this week; continue Home Health for wound care. May utilize formulary equivalent dressing for wound [name] orders unless otherwise specified. - Home Health for skilled wound care nursing once a week and wound center weekly. Apply keystone topical antibiotics and hydrofera blue classic as primary dressing. NO ZINC OXIDE!! Other Home Health Orders/Instructions: [facility] home health Wound [name] Wound #26 - Ankle Wound Laterality: Left, Lateral Cleanser: Soap and Water (Home Health) 2 x Per Week/30 Days Discharge Instructions: May shower and wash wound with dial antibacterial soap and water prior to dressing change. Peri-Wound Care: Sween Lotion (Moisturizing lotion) (Home Health) 2 x Per Week/30 Days Discharge Instructions: Apply moisturizing lotion as directed Topical: Keystone topica Antibiotics (Home Health) 2 x Per Week/30 Days Discharge Instructions: Mix medication according to pharmacy  instruction apply to wound bed. Prim Dressing: Hydrofera Blue Classic Foam, 4x4 in (Home Health) 2 x Per Week/30 Days ary Discharge Instructions: Moisten with saline apply over the [facility] medication. Secondary Dressing: Zetuvit Plus 4x8 in [facility]) 2 x Per Week/30 Days Discharge Instructions: Apply over primary dressing as directed. Compression Wrap: ThreePress (3 layer compression wrap) (Home Health) 2 x Per Week/30 Days Discharge Instructions: Apply three layer compression as directed. Electronic Signature(s) Signed: 01/21/2022 12:38:40 PM By: [name] Shan DO Entered By: [name] on 01/21/2022 11:32:49 -------------------------------------------------------------------------------- Problem List Details Patient Name: Date of Service: [name], [name] 01/21/2022 10:00 A M Medical Record [name]: [id number] Patient Account  [name]: [id number] Date of Birth/Sex: Treating RN: February 25, 1925 (88 y.o. [name] Primary Care Provider: Marda [name] Other Clinician: Referring Provider: Treating Provider/Extender: [name]: 7 Active Problems ICD-10 Encounter Code Description Active Date MDM Diagnosis I87.312 Chronic venous hypertension (idiopathic) with ulcer of left lower extremity 11/27/2021 No Yes L97.822 Non-pressure chronic ulcer of other part of left lower leg with fat layer exposed1/04/2022 No Yes C50.919 Malignant neoplasm of unspecified site of unspecified female breast 11/27/2021 No Yes I50.42 Chronic combined systolic (congestive) and diastolic (congestive) heart failure 11/27/2021 No Yes G30.9 Alzheimer's disease, unspecified 11/27/2021 No Yes C54.1 Malignant neoplasm of endometrium 11/27/2021 No Yes Inactive Problems Resolved Problems Electronic Signature(s) Signed: 01/21/2022 12:38:40 PM By: [name] Shan DO Entered By: [name] on 01/21/2022  11:25:13 -------------------------------------------------------------------------------- Progress Note Details Patient Name: Date of Service: [name], [name] 01/21/2022 10:00 A M Medical Record [name]: [id number] Patient Account [name]: [id number] Date of Birth/Sex: Treating RN: 1925/04/26 (88 y.o. [name] Primary Care Provider: Marda [name] Other Clinician: Referring Provider: Treating Provider/Extender: [name]: 7 Subjective Chief Complaint Information obtained from Patient Left lower extremity wound History of Present Illness (HPI) this is a patient we know from several prior wounds on her bilateral lower extremities. She has venous stasis physiology, inflammation and hypertension. She is been fully evaluated for venous ablation and was found not to be a candidate. She does not have significant arterial disease. 02/14/15 the wound itself on her lateral left leg did not look too bad however there was surrounding maceration which was concerning 02/27/15 the wound itself is small with surrounding circumferential epithelialization there is no surrounding maceration. 03/06/15 only a very small area remains. I think this is mostly epithelialized however I would be uncomfortable not dressing this this week 03/13/15 the area is epithelialized. There was a thick surface on this. I took a #15 blade then lightly disrupted it but there does not appear to be any open area I did not see anything but appropriate epithelium Readmission: 08/16/18 on evaluation today patient presents for initial evaluation and our clinic released readmission although she has not been seen since April 2016. Nonetheless she is having issues with the ulcers currently on the bilateral lateral malleolus or locations as well as the left lateral lower extremity. These have been present for roughly 3 weeks since the patient actually developed significant bilateral lower  extremity edema which patient's daughter states was roughly 3 times the size of her legs currently. Subsequently she ended up in the emerging department due to congestive heart failure. They were able to get the swelling under control and fortunately she seems to be doing much better at this point in time. She was placed in an [employer] which she sat on for week part evaluation today as well. Fortunately the wound areas actually seem to be doing fairly well there is some macerated skin surrounding there are the areas/surface of the wound which are gonna require some debridement at this point. No fevers, chills, nausea, or vomiting noted at this time. Patient has a history of hypertension, mild peripheral vascular disease, chronic venous stasis, and other than the ulcer seems to be doing fairly well today. No fevers, chills, nausea, or vomiting noted at this time. 08/23/18 on evaluation today patient actually appears to be doing great in regard to her bilateral lower should be ulcers. She's shown signs of improvement even just with one week with the [employer].  In general I'm actually very pleased with how things appear at this point. 09/06/18 on evaluation today patient actually appears to be doing much better her left lower extremity is completely healed. Go to the right lower extremity lateral malleolus ulcer this still seems to be showing signs of being open. There does not appear to be evidence of infection which is good news. This did require some sharp debridement however to remove some of the necrotic tissue/eschar on the surface of the wound Readmission 11/27/2021 Ms. Emmilee Reamer is a 88 year old female with a past medical history of endometrial and breast cancer, chronic venous insufficiency, dementia and essential hypertension that presents to the clinic with a 1 month history of nonhealing ulcer to the left lower extremity. She states that the ulcer opened 1 month ago and then  healed but has reopened again in the past week. She is currently keeping the area covered. She has a history of wounds to her lower extremities. She has compression stockings that she uses daily. She currently denies signs of infection. 1/13; patient presents for follow-up. She followed up for nurse visit for wrap change. She reports more odor. She denies pain. 1/20; patient presents for follow-up. She has been using gentamicin ointment with calcium alginate to the wound bed. She reports improvement in odor and drainage. She currently denies systemic signs of infection. 1/27; patient presents for follow-up. She has been using gentamicin ointment with calcium alginate. She received her Keystone antibiotic 2 days ago. She brought this in. Home health has also been established. They will come out for the first time next week. She currently denies signs of infection. She has no issues or complaints today. 2/2; patient presents for follow-up. She has been using Keystone antibiotics with [facility] with no issues. She denies signs of infection. She has no issues or complaints today. 2/9; patient presents for follow-up. She has been using Keystone antibiotics with [facility] with no issues. She has tolerated the compression wrap well. She denies signs of infection. 2/16; patient presents for follow-up. She has been using Keystone antibiotics with Hydrofera Blue under compression wrap with no issues. She currently denies signs of infection. 2/23; patient presents for follow-up. She continues to use Keystone antibiotics with Hydrofera Blue under the compression wrap. She has no issues or complaints today. She denies signs of infection. Home health continues to come out and change the wrap. 3/2; patient presents for follow-up. She continues to use [facility] with [facility] under compression with no issues. She denies signs of infection. Patient History Information obtained from Patient. Family  History Cancer - Child, Hypertension - Child,Father, Kidney Disease - Child, Stroke - Siblings,Child, Thyroid Problems - Child, No family history of Diabetes, Heart Disease, Hereditary Spherocytosis, Lung Disease, Seizures, Tuberculosis. Social History Never smoker, Marital Status - Widowed, Alcohol Use - Never, Drug Use - No History, Caffeine Use - Never. Medical History Eyes Denies history of Cataracts, Glaucoma, Optic Neuritis Ear/Nose/Mouth/Throat Denies history of Chronic sinus problems/congestion, Middle ear problems Hematologic/Lymphatic Patient has history of Anemia Denies history of Hemophilia, Human Immunodeficiency Virus, Lymphedema, Sickle Cell Disease Respiratory Denies history of Aspiration, Asthma, Chronic Obstructive Pulmonary Disease (COPD), Pneumothorax, Sleep Apnea, Tuberculosis Cardiovascular Patient has history of Congestive Heart Failure, Hypertension, Peripheral Venous Disease Denies history of Angina, Arrhythmia, Coronary Artery Disease, Hypotension, Myocardial Infarction, Peripheral Arterial Disease, Phlebitis, Vasculitis Gastrointestinal Denies history of Cirrhosis , Colitis, Crohnoos, Hepatitis A, Hepatitis B, Hepatitis C Endocrine Denies history of Type I Diabetes, Type II Diabetes Genitourinary  Denies history of End Stage Renal Disease Immunological Denies history of Lupus Erythematosus, Raynaudoos, Scleroderma Integumentary (Skin) Denies history of History of Burn Musculoskeletal Patient has history of Osteoarthritis Denies history of Gout, Rheumatoid Arthritis, Osteomyelitis Neurologic Denies history of Neuropathy, Quadriplegia, Paraplegia, Seizure Disorder Oncologic Patient has history of Received Chemotherapy - Breast and Ovarian Cancer Denies history of Received Radiation Hospitalization/Surgery History - diverticulits. Objective Constitutional respirations regular, non-labored and within target range for patient.. Vitals Time Taken: 10:36  AM, Height: 66 in, Weight: 176 lbs, BMI: 28.4, Temperature: 98.4 F, Pulse: 62 bpm, Respiratory Rate: 17 breaths/min, Blood Pressure: 124/77 mmHg. Cardiovascular 2+ dorsalis pedis/posterior tibialis pulses. Psychiatric pleasant and cooperative. General Notes: Well-healingLeft lower extremity: T the distal lateral aspect there is an open wound with granulation tissue and nonviable tissue.[name] No o surrounding signs of infection. Integumentary (Hair, Skin) Wound #26 status is Open. Original cause of wound was Gradually Appeared. The date acquired was: 11/20/2021. The wound has been in [name] 7 weeks. The wound is located on the Left,Lateral Ankle. The wound measures 4.2cm length x 3.4cm width x 0.2cm depth; 11.215cm^2 area and 2.243cm^3 volume. There is Fat Layer (Subcutaneous Tissue) exposed. There is no tunneling or undermining noted. There is a medium amount of serosanguineous drainage noted. The wound margin is distinct with the outline attached to the wound base. There is large (67-100%) red granulation within the wound bed. There is a small (1-33%) amount of necrotic tissue within the wound bed including Adherent Slough. Assessment Active Problems ICD-10 Chronic venous hypertension (idiopathic) with ulcer of left lower extremity Non-pressure chronic ulcer of other part of left lower leg with fat layer exposed Malignant neoplasm of unspecified site of unspecified female breast Chronic combined systolic (congestive) and diastolic (congestive) heart failure Alzheimer's disease, unspecified Malignant neoplasm of endometrium Patient's wound has improved in size and appearance since last clinic visit. I debrided nonviable tissue. No surrounding signs of infection. I recommended continue with current therapy of Hydrofera Blue and Keystone antibiotic under 3 layer compression. Follow-up in 1 week Procedures Wound #26 Pre-procedure diagnosis of Wound #26 is a Venous Leg Ulcer located on the  Left,Lateral Ankle .Severity of Tissue Pre Debridement is: Fat layer exposed. There was a Excisional Skin/Subcutaneous Tissue Debridement with a total area of 14.28 sq cm performed by [name] Shan, DO. With the following instrument(s): Curette to remove Viable and Non-Viable tissue/material. Material removed includes Subcutaneous Tissue, Slough, Skin: Dermis, and Skin: Epidermis after achieving pain control using Lidocaine. No specimens were taken. A time out was conducted at 10:55, prior to the start of the procedure. A Minimum amount of bleeding was controlled with Pressure. The procedure was tolerated well with a pain level of 0 throughout and a pain level of 0 following the procedure. Post Debridement Measurements: 4.2cm length x 3.4cm width x 0.2cm depth; 2.243cm^3 volume. Character of Wound/Ulcer Post Debridement is improved. Severity of Tissue Post Debridement is: Fat layer exposed. Post procedure Diagnosis Wound #26: Same as Pre-Procedure Pre-procedure diagnosis of Wound #26 is a Venous Leg Ulcer located on the Left,Lateral Ankle . There was a Three Layer Compression Therapy Procedure by [name] Hammock, RN. Post procedure Diagnosis Wound #26: Same as Pre-Procedure Plan Follow-up Appointments: Return Appointment in 1 week. - Dr. Heber Tyler and Allayne Butcher, RN: Room # 9 ***Continue to bring keystone topical antibiotic medication to each visit.*** Bathing/ Shower/ Hygiene: May shower with protection but do not get wound dressing(s) wet. - use cast protector. May shower and wash wound with soap and  water. - with dressing changes may wash with soap and water. Edema Control - Lymphedema / SCD / Other: Elevate legs to the level of the heart or above for 30 minutes daily and/or when sitting, a frequency of: Avoid standing for long periods of time. Patient to wear own compression stockings every day. - apply in the morning and remove at night to right leg. Exercise regularly Moisturize legs  daily. - every night before day right leg. Additional Orders / Instructions: Follow Nutritious Diet - High Protein Diet Home Health: No change in wound care orders this week; continue Home Health for wound care. May utilize formulary equivalent dressing for wound [name] orders unless otherwise specified. - Home Health for skilled wound care nursing once a week and wound center weekly. Apply keystone topical antibiotics and hydrofera blue classic as primary dressing. NO ZINC OXIDE!! Other Home Health Orders/Instructions: [facility] home health WOUND #26: - Ankle Wound Laterality: Left, Lateral Cleanser: Soap and Water (Home Health) 2 x Per Week/30 Days Discharge Instructions: May shower and wash wound with dial antibacterial soap and water prior to dressing change. Peri-Wound Care: Sween Lotion (Moisturizing lotion) (Home Health) 2 x Per Week/30 Days Discharge Instructions: Apply moisturizing lotion as directed Topical: Keystone topica Antibiotics (Home Health) 2 x Per Week/30 Days Discharge Instructions: Mix medication according to pharmacy instruction apply to wound bed. Prim Dressing: Hydrofera Blue Classic Foam, 4x4 in (Home Health) 2 x Per Week/30 Days ary Discharge Instructions: Moisten with saline apply over the [facility] medication. Secondary Dressing: Zetuvit Plus 4x8 in [facility]) 2 x Per Week/30 Days Discharge Instructions: Apply over primary dressing as directed. Com pression Wrap: ThreePress (3 layer compression wrap) (Home Health) 2 x Per Week/30 Days Discharge Instructions: Apply three layer compression as directed. 1. In office sharp debridement 2. Keystone antibiotic and Hydrofera Blue under 3 layer compression 3. Follow-up in 1 week Electronic Signature(s) Signed: 01/21/2022 12:38:40 PM By: [name] Shan DO Entered By: [name] on 01/21/2022 11:34:31 -------------------------------------------------------------------------------- HxROS Details Patient  Name: Date of Service: [name], [name] 01/21/2022 10:00 A M Medical Record [name]: [id number] Patient Account [name]: [id number] Date of Birth/Sex: Treating RN: 15-Jul-1925 (88 y.o. [name] Primary Care Provider: Marda [name] Other Clinician: Referring Provider: Treating Provider/Extender: [name]: 7 Information Obtained From Patient Eyes Medical History: Negative for: Cataracts; Glaucoma; Optic Neuritis Ear/Nose/Mouth/Throat Medical History: Negative for: Chronic sinus problems/congestion; Middle ear problems Hematologic/Lymphatic Medical History: Positive for: Anemia Negative for: Hemophilia; Human Immunodeficiency Virus; Lymphedema; Sickle Cell Disease Respiratory Medical History: Negative for: Aspiration; Asthma; Chronic Obstructive Pulmonary Disease (COPD); Pneumothorax; Sleep Apnea; Tuberculosis Cardiovascular Medical History: Positive for: Congestive Heart Failure; Hypertension; Peripheral Venous Disease Negative for: Angina; Arrhythmia; Coronary Artery Disease; Hypotension; Myocardial Infarction; Peripheral Arterial Disease; Phlebitis; Vasculitis Gastrointestinal Medical History: Negative for: Cirrhosis ; Colitis; Crohns; Hepatitis A; Hepatitis B; Hepatitis C Endocrine Medical History: Negative for: Type I Diabetes; Type II Diabetes Genitourinary Medical History: Negative for: End Stage Renal Disease Immunological Medical History: Negative for: Lupus Erythematosus; Raynauds; Scleroderma Integumentary (Skin) Medical History: Negative for: History of Burn Musculoskeletal Medical History: Positive for: Osteoarthritis Negative for: Gout; Rheumatoid Arthritis; Osteomyelitis Neurologic Medical History: Negative for: Neuropathy; Quadriplegia; Paraplegia; Seizure Disorder Oncologic Medical History: Positive for: Received Chemotherapy - Breast and Ovarian Cancer Negative for: Received  Radiation Immunizations Pneumococcal Vaccine: Received Pneumococcal Vaccination: Yes Received Pneumococcal Vaccination On or After 60th Birthday: Yes Implantable Devices None Hospitalization / Surgery History Type of Hospitalization/Surgery diverticulits Family and Social  History Cancer: Yes - Child; Diabetes: No; Heart Disease: No; Hereditary Spherocytosis: No; Hypertension: Yes - Child,Father; Kidney Disease: Yes - Child; Lung Disease: No; Seizures: No; Stroke: Yes - Siblings,Child; Thyroid Problems: Yes - Child; Tuberculosis: No; Never smoker; Marital Status - Widowed; Alcohol Use: Never; Drug Use: No History; Caffeine Use: Never; Financial Concerns: No; Food, Clothing or Shelter Needs: No; Support System Lacking: No; Transportation Concerns: No Electronic Signature(s) Signed: 01/21/2022 12:38:40 PM By: [name] Shan DO Signed: 01/21/2022 4:17:36 PM By: [name] Hammock RN Entered By: [name] on 01/21/2022 11:28:10 -------------------------------------------------------------------------------- SuperBill Details Patient Name: Date of Service: [name], [name] 01/21/2022 Medical Record [name]: [id number] Patient Account [name]: [id number] Date of Birth/Sex: Treating RN: 1925/02/07 (88 y.o. [name] Primary Care Provider: Marda [name] Other Clinician: Referring Provider: Treating Provider/Extender: Marjo Bicker Weeks in [name]: 7 Diagnosis Coding ICD-10 Codes Code Description [phone] Chronic venous hypertension (idiopathic) with ulcer of left lower extremity L97.822 Non-pressure chronic ulcer of other part of left lower leg with fat layer exposed C50.919 Malignant neoplasm of unspecified site of unspecified female breast I50.42 Chronic combined systolic (congestive) and diastolic (congestive) heart failure G30.9 Alzheimer's disease, unspecified C54.1 Malignant neoplasm of endometrium Facility Procedures CPT4 Code: [id number]  I Description: 99833 - DEB SUBQ TISSUE 20 SQ CM/< CD-10 Diagnosis Description L97.822 Non-pressure chronic ulcer of other part of left lower leg with fat layer expo Modifier: sed Quantity: 1 Physician Procedures : CPT4 Code Description Modifier [id number] - WC PHYS SUBQ TISS 20 SQ CM ICD-10 Diagnosis Description L97.822 Non-pressure chronic ulcer of other part of left lower leg with fat layer exposed Quantity: 1 Electronic Signature(s) Signed: 01/21/2022 12:38:40 PM By: [name] Shan DO Entered By: [name] on 01/21/2022 11:34:40

## 2022-01-21 NOTE — Progress Notes (Signed)
[name], [name] ([id number]) Visit Report for 01/21/2022 Arrival Information Details Patient Name: Date of Service: [name], [name] 01/21/2022 10:00 A M Medical Record Number: [id number] Patient Account Number: [id number] Date of Birth/Sex: Treating RN: 1925/05/03 (88 y.o. [name], [name] Primary Care Gurjit Loconte: Marda Stalker Other Clinician: Referring Juneau Doughman: Treating Zariel Capano/Extender: Aviva Signs in Treatment: 7 Visit Information History Since Last Visit Added or deleted any medications: No Patient Arrived: Wheel Chair Any new allergies or adverse reactions: No Arrival Time: 10:35 Had a fall or experienced change in No Accompanied By: daughter activities of daily living that may affect Transfer Assistance: Manual risk of falls: Patient Identification Verified: Yes Signs or symptoms of abuse/neglect since last visito No Secondary Verification Process Completed: Yes Hospitalized since last visit: No Patient Requires Transmission-Based Precautions: No Implantable device outside of the clinic excluding No Patient Has Alerts: No cellular tissue based products placed in the center since last visit: Has Dressing in Place as Prescribed: Yes Has Compression in Place as Prescribed: Yes Pain Present Now: No Electronic Signature(s) Signed: 01/21/2022 4:17:36 PM By: [name] Entered By: Rhae Hammock on 01/21/2022 10:36:13 -------------------------------------------------------------------------------- Compression Therapy Details Patient Name: Date of Service: [name], [name] 01/21/2022 10:00 A M Medical Record Number: [id number] Patient Account Number: [id number] Date of Birth/Sex: Treating RN: [birth year] (88 y.o. [name], [name] Primary Care Jahmar Mckelvy: Marda Stalker Other Clinician: Referring Micaylah Bertucci: Treating Mikhael Hendriks/Extender: Marjo Bicker Weeks in Treatment: 7 Compression Therapy Performed  for Wound Assessment: Wound #26 Left,Lateral Ankle Performed By: Clinician Rhae Hammock, RN Compression Type: Three Layer Post Procedure Diagnosis Same as Pre-procedure Electronic Signature(s) Signed: 01/21/2022 4:17:36 PM By: [name] Entered By: Rhae Hammock on 01/21/2022 10:56:40 -------------------------------------------------------------------------------- Encounter Discharge Information Details Patient Name: Date of Service: [name], [name] 01/21/2022 10:00 A M Medical Record Number: [id number] Patient Account Number: [id number] Date of Birth/Sex: Treating RN: [birth year] (89 y.o. [name], [name] Primary Care Oneita Allmon: Marda Stalker Other Clinician: Referring Dreana Britz: Treating Jatin Naumann/Extender: Aviva Signs in Treatment: 7 Encounter Discharge Information Items Post Procedure Vitals Discharge Condition: Stable Temperature (F): 98.7 Ambulatory Status: Wheelchair Pulse (bpm): 74 Discharge Destination: Home Respiratory Rate (breaths/min): 17 Transportation: Private Auto Blood Pressure (mmHg): 134/74 Accompanied By: self Schedule Follow-up Appointment: Yes Clinical Summary of Care: Patient Declined Electronic Signature(s) Signed: 01/21/2022 4:17:36 PM By: [name] Entered By: Rhae Hammock on 01/21/2022 11:13:34 -------------------------------------------------------------------------------- Lower Extremity Assessment Details Patient Name: Date of Service: [name], [name] 01/21/2022 10:00 A M Medical Record Number: [id number] Patient Account Number: [id number] Date of Birth/Sex: Treating RN: [birth year] (88 y.o. [name], [name] Primary Care Estevan Kersh: Marda Stalker Other Clinician: Referring Willoughby Doell: Treating Kevron Patella/Extender: Marjo Bicker Weeks in Treatment: 7 Edema Assessment Assessed: [name]: Yes] [Right: No] Edema: [Left: N] [Right: o] Calf Left:  Right: Point of Measurement: 30 cm From Medial Instep 33 cm Ankle Left: Right: Point of Measurement: 11 cm From Medial Instep 21.4 cm Vascular Assessment Pulses: Dorsalis Pedis Palpable: [Left:Yes] Posterior Tibial Palpable: [Left:Yes] Electronic Signature(s) Signed: 01/21/2022 4:17:36 PM By: [name] Entered By: Rhae Hammock on 01/21/2022 10:43:44 -------------------------------------------------------------------------------- Multi Wound Chart Details Patient Name: Date of Service: [name], [name] 01/21/2022 10:00 A M Medical Record Number: [id number] Patient Account Number: [id number] Date of Birth/Sex: Treating RN: 1925/02/07 (88 y.o. [name], [name] Primary Care Zakry Caso: Marda Stalker Other Clinician: Referring Marvine Encalade: Treating Leonie Amacher/Extender: Marjo Bicker Weeks in Treatment: 7 Vital Signs Height(in): 66 Pulse(bpm): 62 Weight(lbs): 176 Blood Pressure(mmHg): 124/77  Body Mass Index(BMI): 28.4 Temperature(F): 98.4 Respiratory Rate(breaths/min): 17 Photos: [N/A:N/A] Left, Lateral Ankle N/A N/A Wound Location: Gradually Appeared N/A N/A Wounding Event: Venous Leg Ulcer N/A N/A Primary Etiology: Anemia, Congestive Heart Failure, N/A N/A Comorbid History: Hypertension, Peripheral Venous Disease, Osteoarthritis, Received Chemotherapy 11/20/2021 N/A N/A Date Acquired: 7 N/A N/A Weeks of Treatment: Open N/A N/A Wound Status: No N/A N/A Wound Recurrence: 4.2x3.4x0.2 N/A N/A Measurements L x W x D (cm) 11.215 N/A N/A A (cm) : rea 2.243 N/A N/A Volume (cm) : -19.00% N/A N/A % Reduction in A rea: -138.10% N/A N/A % Reduction in Volume: Full Thickness Without Exposed N/A N/A Classification: Support Structures Medium N/A N/A Exudate A mount: Serosanguineous N/A N/A Exudate Type: red, brown N/A N/A Exudate Color: Distinct, outline attached N/A N/A Wound Margin: Large (67-100%) N/A  N/A Granulation A mount: Red N/A N/A Granulation Quality: Small (1-33%) N/A N/A Necrotic A mount: Fat Layer (Subcutaneous Tissue): Yes N/A N/A Exposed Structures: Fascia: No Tendon: No Muscle: No Joint: No Bone: No Small (1-33%) N/A N/A Epithelialization: Debridement - Excisional N/A N/A Debridement: Pre-procedure Verification/Time Out 10:55 N/A N/A Taken: Lidocaine N/A N/A Pain Control: Subcutaneous, Slough N/A N/A Tissue Debrided: Skin/Subcutaneous Tissue N/A N/A Level: 14.28 N/A N/A Debridement A (sq cm): rea Curette N/A N/A Instrument: Minimum N/A N/A Bleeding: Pressure N/A N/A Hemostasis A chieved: 0 N/A N/A Procedural Pain: 0 N/A N/A Post Procedural Pain: Procedure was tolerated well N/A N/A Debridement Treatment Response: 4.2x3.4x0.2 N/A N/A Post Debridement Measurements L x W x D (cm) 2.243 N/A N/A Post Debridement Volume: (cm) Compression Therapy N/A N/A Procedures Performed: Debridement Treatment Notes Wound #26 (Ankle) Wound Laterality: Left, Lateral Cleanser Soap and Water Discharge Instruction: May shower and wash wound with dial antibacterial soap and water prior to dressing change. Peri-Wound Care Sween Lotion (Moisturizing lotion) Discharge Instruction: Apply moisturizing lotion as directed Topical Keystone topica Antibiotics Discharge Instruction: Mix medication according to pharmacy instruction apply to wound bed. Primary Dressing Hydrofera Blue Classic Foam, 4x4 in Discharge Instruction: Moisten with saline apply over the [facility] medication. Secondary Dressing Zetuvit Plus 4x8 in Discharge Instruction: Apply over primary dressing as directed. Secured With Compression Wrap ThreePress (3 layer compression wrap) Discharge Instruction: Apply three layer compression as directed. Compression Stockings Add-Ons Electronic Signature(s) Signed: 01/21/2022 12:38:40 PM By: Kalman Shan DO Signed: 01/21/2022 4:17:36 PM By: [name] Entered By: Kalman Shan on 01/21/2022 11:25:31 -------------------------------------------------------------------------------- Multi-Disciplinary Care Plan Details Patient Name: Date of Service: [name], [name] 01/21/2022 10:00 A M Medical Record Number: [id number] Patient Account Number: [id number] Date of Birth/Sex: Treating RN: 1925-07-13 (88 y.o. [name], [name] Primary Care Rayder Sullenger: Marda Stalker Other Clinician: Referring Dasani Crear: Treating Jaleisa Brose/Extender: Aviva Signs in Treatment: 7 Active Inactive Nutrition Nursing Diagnoses: Imbalanced nutrition Goals: Patient/caregiver agrees to and verbalizes understanding of need to use nutritional supplements and/or vitamins as prescribed Date Initiated: 11/27/2021 Target Resolution Date: 01/22/2022 Goal Status: Active Interventions: Assess patient nutrition upon admission and as needed per policy Provide education on nutrition Treatment Activities: Education provided on Nutrition : 01/14/2022 Notes: Wound/Skin Impairment Nursing Diagnoses: Impaired tissue integrity Goals: Patient/caregiver will verbalize understanding of skin care regimen Date Initiated: 11/27/2021 Target Resolution Date: 01/22/2022 Goal Status: Active Ulcer/skin breakdown will have a volume reduction of 30% by week 4 Date Initiated: 11/27/2021 Date Inactivated: 12/31/2021 Target Resolution Date: 12/25/2021 Unmet Reason: according to Goal Status: Unmet measurements has not reduced. Interventions: Assess patient/caregiver ability to obtain necessary supplies Assess patient/caregiver ability to  perform ulcer/skin care regimen upon admission and as needed Assess ulceration(s) every visit Provide education on ulcer and skin care Treatment Activities: Topical wound management initiated : 11/27/2021 Notes: Electronic Signature(s) Signed: 01/21/2022 4:17:36 PM By: [name] Entered By: Rhae Hammock on  01/21/2022 10:52:16 -------------------------------------------------------------------------------- Pain Assessment Details Patient Name: Date of Service: [name], [name] 01/21/2022 10:00 [place] Record Number: [id number] Patient Account Number: [id number] Date of Birth/Sex: Treating RN: 1925/08/21 (88 y.o. [name], [name] Primary Care Jeanita Carneiro: Marda Stalker Other Clinician: Referring Mehul Rudin: Treating Merl Guardino/Extender: Marjo Bicker Weeks in Treatment: 7 Active Problems Location of Pain Severity and Description of Pain Patient Has Paino No Site Locations Pain Management and Medication Current Pain Management: Electronic Signature(s) Signed: 01/21/2022 4:17:36 PM By: [name] Entered By: Rhae Hammock on 01/21/2022 10:36:39 -------------------------------------------------------------------------------- Patient/Caregiver Education Details Patient Name: Date of Service: [name] 3/2/2023andnbsp10:00 A M Medical Record Number: [id number] Patient Account Number: [id number] Date of Birth/Gender: Treating RN: Apr 22, 1925 (88 y.o. [name], [name] Primary Care Physician: Marda Stalker Other Clinician: Referring Physician: Treating Physician/Extender: Aviva Signs in Treatment: 7 Education Assessment Education Provided To: Patient Education Topics Provided Nutrition: Methods: Explain/Verbal Responses: Reinforcements needed, State content correctly Wound/Skin Impairment: Methods: Explain/Verbal Responses: Reinforcements needed, State content correctly Electronic Signature(s) Signed: 01/21/2022 4:17:36 PM By: [name] Entered By: Rhae Hammock on 01/21/2022 10:52:37 -------------------------------------------------------------------------------- Wound Assessment Details Patient Name: Date of Service: [name], ROCHELLE [name] 01/21/2022 10:00 A M Medical Record Number:  [id number] Patient Account Number: [id number] Date of Birth/Sex: Treating RN: [birth year] (88 y.o. [name], [name] Primary Care Rodriguez Aguinaldo: Marda Stalker Other Clinician: Referring Jong Rickman: Treating Adriano Bischof/Extender: Marjo Bicker Weeks in Treatment: 7 Wound Status Wound Number: 26 Primary Venous Leg Ulcer Etiology: Wound Location: Left, Lateral Ankle Wound Open Wounding Event: Gradually Appeared Status: Date Acquired: 11/20/2021 Comorbid Anemia, Congestive Heart Failure, Hypertension, Peripheral Weeks Of Treatment: 7 History: Venous Disease, Osteoarthritis, Received Chemotherapy Clustered Wound: No Photos Wound Measurements Length: (cm) 4.2 Width: (cm) 3.4 Depth: (cm) 0.2 Area: (cm) 11.215 Volume: (cm) 2.243 % Reduction in Area: -19% % Reduction in Volume: -138.1% Epithelialization: Small (1-33%) Tunneling: No Undermining: No Wound Description Classification: Full Thickness Without Exposed Support Structures Wound Margin: Distinct, outline attached Exudate Amount: Medium Exudate Type: Serosanguineous Exudate Color: red, brown Foul Odor After Cleansing: No Slough/Fibrino Yes Wound Bed Granulation Amount: Large (67-100%) Exposed Structure Granulation Quality: Red Fascia Exposed: No Necrotic Amount: Small (1-33%) Fat Layer (Subcutaneous Tissue) Exposed: Yes Necrotic Quality: Adherent Slough Tendon Exposed: No Muscle Exposed: No Joint Exposed: No Bone Exposed: No Treatment Notes Wound #26 (Ankle) Wound Laterality: Left, Lateral Cleanser Soap and Water Discharge Instruction: May shower and wash wound with dial antibacterial soap and water prior to dressing change. Peri-Wound Care Sween Lotion (Moisturizing lotion) Discharge Instruction: Apply moisturizing lotion as directed Topical Keystone topica Antibiotics Discharge Instruction: Mix medication according to pharmacy instruction apply to wound bed. Primary Dressing Hydrofera  Blue Classic Foam, 4x4 in Discharge Instruction: Moisten with saline apply over the [facility] medication. Secondary Dressing Zetuvit Plus 4x8 in Discharge Instruction: Apply over primary dressing as directed. Secured With Compression Wrap ThreePress (3 layer compression wrap) Discharge Instruction: Apply three layer compression as directed. Compression Stockings Add-Ons Electronic Signature(s) Signed: 01/21/2022 4:17:36 PM By: [name] Entered By: Rhae Hammock on 01/21/2022 10:46:37 -------------------------------------------------------------------------------- Vitals Details Patient Name: Date of Service: KATTIE, SANTOYO [name] 01/21/2022 10:00 A M Medical Record Number: [id number] Patient Account Number: [id number] Date of Birth/Sex:  Treating RN: 21-Mar-1925 (88 y.o. [name], [name] Primary Care Malekai Markwood: Marda Stalker Other Clinician: Referring Luceal Hollibaugh: Treating Hiilei Gerst/Extender: Marjo Bicker Weeks in Treatment: 7 Vital Signs Time Taken: 10:36 Temperature (F): 98.4 Height (in): 66 Pulse (bpm): 62 Weight (lbs): 176 Respiratory Rate (breaths/min): 17 Body Mass Index (BMI): 28.4 Blood Pressure (mmHg): 124/77 Reference Range: 80 - 120 mg / dl Electronic Signature(s) Signed: 01/21/2022 4:17:36 PM By: [name] Entered By: Rhae Hammock on 01/21/2022 10:36:30

## 2022-01-28 ENCOUNTER — Encounter (HOSPITAL_BASED_OUTPATIENT_CLINIC_OR_DEPARTMENT_OTHER): Payer: Medicare PPO | Admitting: Internal Medicine

## 2022-02-04 ENCOUNTER — Encounter (HOSPITAL_BASED_OUTPATIENT_CLINIC_OR_DEPARTMENT_OTHER): Payer: Medicare PPO | Admitting: Internal Medicine

## 2022-02-04 ENCOUNTER — Other Ambulatory Visit: Payer: Self-pay

## 2022-02-04 DIAGNOSIS — G309 Alzheimer's disease, unspecified: Secondary | ICD-10-CM | POA: Diagnosis not present

## 2022-02-04 DIAGNOSIS — L97822 Non-pressure chronic ulcer of other part of left lower leg with fat layer exposed: Secondary | ICD-10-CM

## 2022-02-04 DIAGNOSIS — C50919 Malignant neoplasm of unspecified site of unspecified female breast: Secondary | ICD-10-CM | POA: Diagnosis not present

## 2022-02-04 DIAGNOSIS — I110 Hypertensive heart disease with heart failure: Secondary | ICD-10-CM | POA: Diagnosis not present

## 2022-02-04 DIAGNOSIS — Z9221 Personal history of antineoplastic chemotherapy: Secondary | ICD-10-CM | POA: Diagnosis not present

## 2022-02-04 DIAGNOSIS — C541 Malignant neoplasm of endometrium: Secondary | ICD-10-CM | POA: Diagnosis not present

## 2022-02-04 DIAGNOSIS — I87312 Chronic venous hypertension (idiopathic) with ulcer of left lower extremity: Secondary | ICD-10-CM | POA: Diagnosis not present

## 2022-02-04 DIAGNOSIS — I509 Heart failure, unspecified: Secondary | ICD-10-CM | POA: Diagnosis not present

## 2022-02-04 DIAGNOSIS — I5042 Chronic combined systolic (congestive) and diastolic (congestive) heart failure: Secondary | ICD-10-CM | POA: Diagnosis not present

## 2022-02-05 NOTE — Progress Notes (Signed)
DEVERY, ODWYER ([id number]) ?Visit Report for 02/04/2022 ?Chief Complaint Document Details ?Patient Name: Date of Service: ?[name], [name] 02/04/2022 10:15 A M ?Medical Record Number: [id number] ?Patient Account Number: [id number] ?Date of Birth/Sex: Treating RN: ?[birth year] (88 y.o. [name], [name] ?Primary Care Provider: Marda Stalker Other Clinician: ?Referring Provider: ?Treating Provider/Extender: Kalman Shan ?Marda Stalker ?Weeks in Treatment: 9 ?Information Obtained from: Patient ?Chief Complaint ?Left lower extremity wound ?Electronic Signature(s) ?Signed: 02/04/2022 12:44:27 PM By: Kalman Shan DO ?Entered By: Kalman Shan on 02/04/2022 12:41:26 ?-------------------------------------------------------------------------------- ?Debridement Details ?Patient Name: Date of Service: ?[name], [name] 02/04/2022 10:15 A M ?Medical Record Number: [id number] ?Patient Account Number: [id number] ?Date of Birth/Sex: Treating RN: ?Feb 08, 1925 (88 y.o. [name], [name] ?Primary Care Provider: Marda Stalker Other Clinician: ?Referring Provider: ?Treating Provider/Extender: Kalman Shan ?Marda Stalker ?Weeks in Treatment: 9 ?Debridement Performed for Assessment: Wound #26 Left,Lateral Ankle ?Performed By: Physician Kalman Shan, DO ?Debridement Type: Debridement ?Severity of Tissue Pre Debridement: Fat layer exposed ?Level of Consciousness (Pre-procedure): Awake and Alert ?Pre-procedure Verification/Time Out Yes - 11:02 ?Taken: ?Start Time: 11:02 ?Pain Control: Lidocaine ?T Area Debrided (L x W): ?otal 3.8 (cm) x 2.8 (cm) = 10.64 (cm?) ?Tissue and other material debrided: Viable, Non-Viable, Slough, Subcutaneous, Slough ?Level: Skin/Subcutaneous Tissue ?Debridement Description: Excisional ?Instrument: Curette ?Bleeding: Minimum ?Hemostasis Achieved: Pressure ?End Time: 11:02 ?Procedural Pain: 0 ?Post Procedural Pain: 0 ?Response to Treatment: Procedure was tolerated well ?Level  of Consciousness (Post- Awake and Alert ?procedure): ?Post Debridement Measurements of Total Wound ?Length: (cm) 3.8 ?Width: (cm) 2.8 ?Depth: (cm) 0.2 ?Volume: (cm?) 1.671 ?Character of Wound/Ulcer Post Debridement: Improved ?Severity of Tissue Post Debridement: Fat layer exposed ?Post Procedure Diagnosis ?Same as Pre-procedure ?Electronic Signature(s) ?Signed: 02/04/2022 12:44:27 PM By: Kalman Shan DO ?Signed: 02/05/2022 12:57:05 PM By: [name] ?Entered By: Rhae Hammock on 02/04/2022 11:04:43 ?-------------------------------------------------------------------------------- ?HPI Details ?Patient Name: Date of Service: ?[name], [name] 02/04/2022 10:15 A M ?Medical Record Number: [id number] ?Patient Account Number: [id number] ?Date of Birth/Sex: Treating RN: ?[birth year] (88 y.o. [name], [name] ?Primary Care Provider: Marda Stalker Other Clinician: ?Referring Provider: ?Treating Provider/Extender: Kalman Shan ?Marda Stalker ?Weeks in Treatment: 9 ?History of Present Illness ?HPI Description: this is a patient we know from several prior wounds on her bilateral lower extremities. She has venous stasis physiology, inflammation and ?hypertension. She is been fully evaluated for venous ablation and was found not to be a candidate. She does not have significant arterial disease. ?02/14/15 the wound itself on her lateral left leg did not look too bad however there was surrounding maceration which was concerning ?02/27/15 the wound itself is small with surrounding circumferential epithelialization there is no surrounding maceration. ?03/06/15 only a very small area remains. I think this is mostly epithelialized however I would be uncomfortable not dressing this this week ?03/13/15 the area is epithelialized. There was a thick surface on this. I took a #15 blade then lightly disrupted it but there does not appear to be any open area I ?did not see anything but appropriate  epithelium ?Readmission: ?08/16/18 on evaluation today patient presents for initial evaluation and our clinic released readmission although she has not been seen since April 2016. ?Nonetheless she is having issues with the ulcers currently on the bilateral lateral malleolus or locations as well as the left lateral lower extremity. These have ?been present for roughly 3 weeks since the patient actually developed significant bilateral lower extremity edema which patient's daughter states was roughly 3 ?times the size of her legs currently. Subsequently she  ended up in the emerging department due to congestive heart failure. They were able to get the swelling ?under control and fortunately she seems to be doing much better at this point in time. She was placed in an [employer] which she sat on for week part ?evaluation today as well. Fortunately the wound areas actually seem to be doing fairly well there is some macerated skin surrounding there are the ?areas/surface of the wound which are gonna require some debridement at this point. No fevers, chills, nausea, or vomiting noted at this time. Patient has a ?history of hypertension, mild peripheral vascular disease, chronic venous stasis, and other than the ulcer seems to be doing fairly well today. No fevers, ?chills, nausea, or vomiting noted at this time. ?08/23/18 on evaluation today patient actually appears to be doing great in regard to her bilateral lower should be ulcers. She's shown signs of improvement even ?just with one week with the [employer]. In general I'm actually very pleased with how things appear at this point. ?09/06/18 on evaluation today patient actually appears to be doing much better her left lower extremity is completely healed. Go to the right lower extremity ?lateral malleolus ulcer this still seems to be showing signs of being open. There does not appear to be evidence of infection which is good news. This did require ?some sharp  debridement however to remove some of the necrotic tissue/eschar on the surface of the wound ?Readmission 11/27/2021 ?[name] is a 88 year old female with a past medical history of endometrial and breast cancer, chronic venous insufficiency, dementia and ?essential hypertension that presents to the clinic with a 1 month history of nonhealing ulcer to the left lower extremity. She states that the ulcer opened 1 ?month ago and then healed but has reopened again in the past week. She is currently keeping the area covered. She has a history of wounds to her lower ?extremities. She has compression stockings that she uses daily. She currently denies signs of infection. ?1/13; patient presents for follow-up. She followed up for nurse visit for wrap change. She reports more odor. She denies pain. ?1/20; patient presents for follow-up. She has been using gentamicin ointment with calcium alginate to the wound bed. She reports improvement in odor and ?drainage. She currently denies systemic signs of infection. ?1/27; patient presents for follow-up. She has been using gentamicin ointment with calcium alginate. She received her Keystone antibiotic 2 days ago. She ?brought this in. Home health has also been established. They will come out for the first time next week. She currently denies signs of infection. She has no ?issues or complaints today. ?2/2; patient presents for follow-up. She has been using Keystone antibiotics with [facility] with no issues. She denies signs of infection. She has no ?issues or complaints today. ?2/9; patient presents for follow-up. She has been using Keystone antibiotics with [facility] with no issues. She has tolerated the compression wrap well. ?She denies signs of infection. ?2/16; patient presents for follow-up. She has been using Keystone antibiotics with Hydrofera Blue under compression wrap with no issues. She currently denies ?signs of infection. ?2/23; patient presents  for follow-up. She continues to use Keystone antibiotics with Hydrofera Blue under the compression wrap. She has no issues or ?complaints today. She denies signs of infection. Home health continues to come out and change the w

## 2022-02-05 NOTE — Progress Notes (Signed)
ALVERTA, CACCAMO ([id number]) ?Visit Report for 02/04/2022 ?Arrival Information Details ?Patient Name: Date of Service: ?Longley, [name] 02/04/2022 10:15 A M ?Medical Record Number: [id number] ?Patient Account Number: [id number] ?Date of Birth/Sex: Treating RN: ?June 13, 1925 (88 y.o. [name], [name] ?Primary Care Averill Pons: Marda Stalker Other Clinician: ?Referring Sylvio Weatherall: ?Treating Mahrosh Donnell/Extender: Kalman Shan ?Marda Stalker ?Weeks in Treatment: 9 ?Visit Information History Since Last Visit ?Added or deleted any medications: No ?Patient Arrived: Wheel Chair ?Any new allergies or adverse reactions: No ?Arrival Time: 10:46 ?Had a fall or experienced change in No ?Accompanied By: daughter ?activities of daily living that may affect ?Transfer Assistance: Manual ?risk of falls: ?Patient Identification Verified: Yes ?Signs or symptoms of abuse/neglect since last visito No ?Secondary Verification Process Completed: Yes ?Hospitalized since last visit: No ?Patient Requires Transmission-Based Precautions: No ?Implantable device outside of the clinic excluding No ?Patient Has Alerts: No ?cellular tissue based products placed in the center ?since last visit: ?Pain Present Now: No ?Electronic Signature(s) ?Signed: 02/05/2022 12:57:05 PM By: [name] ?Entered By: Rhae Hammock on 02/04/2022 10:47:26 ?-------------------------------------------------------------------------------- ?Compression Therapy Details ?Patient Name: Date of Service: ?Wisler, [name] 02/04/2022 10:15 A M ?Medical Record Number: [id number] ?Patient Account Number: [id number] ?Date of Birth/Sex: Treating RN: ?Oct 25, 1925 (88 y.o. [name], [name] ?Primary Care Izaih Kataoka: Marda Stalker Other Clinician: ?Referring Saahir Prude: ?Treating Rayann Jolley/Extender: Kalman Shan ?Marda Stalker ?Weeks in Treatment: 9 ?Compression Therapy Performed for Wound Assessment: Wound #26 Left,Lateral Ankle ?Performed By: Clinician  Rhae Hammock, RN ?Compression Type: Three Layer ?Post Procedure Diagnosis ?Same as Pre-procedure ?Electronic Signature(s) ?Signed: 02/05/2022 12:57:05 PM By: [name] ?Entered By: Rhae Hammock on 02/04/2022 11:05:25 ?-------------------------------------------------------------------------------- ?Encounter Discharge Information Details ?Patient Name: ?Date of Service: ?Amyx, [name] 02/04/2022 10:15 A M ?Medical Record Number: [id number] ?Patient Account Number: [id number] ?Date of Birth/Sex: ?Treating RN: ?1925-10-25 (88 y.o. [name], [name] ?Primary Care Rex Oesterle: Marda Stalker ?Other Clinician: ?Referring Dawaun Brancato: ?Treating Bernard Slayden/Extender: Kalman Shan ?Marda Stalker ?Weeks in Treatment: 9 ?Encounter Discharge Information Items Post Procedure Vitals ?Discharge Condition: Stable ?Temperature (F): 98.7 ?Ambulatory Status: Wheelchair ?Pulse (bpm): 74 ?Discharge Destination: Home ?Respiratory Rate (breaths/min): 17 ?Transportation: Private Auto ?Blood Pressure (mmHg): 134/74 ?Accompanied By: daughter ?Schedule Follow-up Appointment: Yes ?Clinical Summary of Care: Patient Declined ?Electronic Signature(s) ?Signed: 02/05/2022 12:57:05 PM By: [name] ?Entered By: Rhae Hammock on 02/04/2022 11:12:32 ?-------------------------------------------------------------------------------- ?Lower Extremity Assessment Details ?Patient Name: ?Date of Service: ?Glazier, [name] 02/04/2022 10:15 A M ?Medical Record Number: [id number] ?Patient Account Number: [id number] ?Date of Birth/Sex: ?Treating RN: ?[birth year] (88 y.o. [name], [name] ?Primary Care Jadelynn Boylan: Marda Stalker ?Other Clinician: ?Referring Joselynn Amoroso: ?Treating Elbridge Magowan/Extender: Kalman Shan ?Marda Stalker ?Weeks in Treatment: 9 ?Edema Assessment ?Assessed: [Left: Yes] [Right: No] ?Edema: [Left: N] [Right: o] ?Calf ?Left: Right: ?Point of Measurement: 30 cm From Medial Instep 30 cm ?Ankle ?Left:  Right: ?Point of Measurement: 11 cm From Medial Instep 21.4 cm ?Vascular Assessment ?Pulses: ?Dorsalis Pedis ?Palpable: [Left:Yes] ?Posterior Tibial ?Palpable: [Left:Yes] ?Electronic Signature(s) ?Signed: 02/05/2022 12:57:05 PM By: [name] ?Entered By: Rhae Hammock on 02/04/2022 10:54:33 ?-------------------------------------------------------------------------------- ?Multi Wound Chart Details ?Patient Name: ?Date of Service: ?Gulledge, [name] 02/04/2022 10:15 A M ?Medical Record Number: [id number] ?Patient Account Number: [id number] ?Date of Birth/Sex: ?Treating RN: ?06-29-1925 (88 y.o. [name], [name] ?Primary Care Kerrie Latour: Marda Stalker ?Other Clinician: ?Referring Rosalia Mcavoy: ?Treating Twilla Khouri/Extender: Kalman Shan ?Marda Stalker ?Weeks in Treatment: 9 ?Vital Signs ?Height(in): 66 ?Pulse(bpm): 62 ?Weight(lbs): 176 ?Blood Pressure(mmHg): 102/62 ?Body Mass Index(BMI): 28.4 ?Temperature(??F): 97.9 ?Respiratory Rate(breaths/min): 18 ?Photos: [N/A:N/A] ?Left, Lateral Ankle  N/A N/A ?Wound Location: ?Gradually Appeared N/A N/A ?Wounding Event: ?Venous Leg Ulcer N/A N/A ?Primary Etiology: ?Anemia, Congestive Heart Failure, N/A N/A ?Comorbid History: ?Hypertension, Peripheral Venous ?Disease, Osteoarthritis, Received ?Chemotherapy ?11/20/2021 N/A N/A ?Date Acquired: ?23 N/A N/A ?Weeks of Treatment: ?Open N/A N/A ?Wound Status: ?No N/A N/A ?Wound Recurrence: ?3.8x2.8x0.2 N/A N/A ?Measurements L x W x D (cm) ?8.357 N/A N/A ?A (cm?) : ?rea ?1.671 N/A N/A ?Volume (cm?) : ?11.30% N/A N/A ?% Reduction in A rea: ?-77.40% N/A N/A ?% Reduction in Volume: ?Full Thickness Without Exposed N/A N/A ?Classification: ?Support Structures ?Medium N/A N/A ?Exudate A mount: ?Serosanguineous N/A N/A ?Exudate Type: ?red, brown N/A N/A ?Exudate Color: ?Distinct, outline attached N/A N/A ?Wound Margin: ?Large (67-100%) N/A N/A ?Granulation A mount: ?Red N/A N/A ?Granulation Quality: ?Small (1-33%) N/A  N/A ?Necrotic A mount: ?Fat Layer (Subcutaneous Tissue): Yes N/A N/A ?Exposed Structures: ?Fascia: No ?Tendon: No ?Muscle: No ?Joint: No ?Bone: No ?Small (1-33%) N/A N/A ?Epithelialization: ?Debridement - Excisional N/A N/A ?Debridement: ?Pre-procedure Verification/Time Out 11:02 N/A N/A ?Taken: ?Lidocaine N/A N/A ?Pain Control: ?Subcutaneous, Slough N/A N/A ?Tissue Debrided: ?Skin/Subcutaneous Tissue N/A N/A ?Level: ?10.64 N/A N/A ?Debridement A (sq cm): ?rea ?Curette N/A N/A ?Instrument: ?Minimum N/A N/A ?Bleeding: ?Pressure N/A N/A ?Hemostasis A chieved: ?0 N/A N/A ?Procedural Pain: ?0 N/A N/A ?Post Procedural Pain: ?Procedure was tolerated well N/A N/A ?Debridement Treatment Response: ?3.8x2.8x0.2 N/A N/A ?Post Debridement Measurements L x ?W x D (cm) ?1.671 N/A N/A ?Post Debridement Volume: (cm?) ?Compression Therapy N/A N/A ?Procedures Performed: ?Debridement ?Treatment Notes ?Wound #26 (Ankle) Wound Laterality: Left, Lateral ?Cleanser ?Soap and Water ?Discharge Instruction: May shower and wash wound with dial antibacterial soap and water prior to dressing change. ?Peri-Wound Care ?Sween Lotion (Moisturizing lotion) ?Discharge Instruction: Apply moisturizing lotion as directed ?Topical ?Keystone topical Antibiotics ?Discharge Instruction: Mix medication according to pharmacy instruction apply to wound bed. ?Primary Dressing ?Hydrofera Blue Classic Foam, 4x4 in ?Discharge Instruction: Moisten with saline apply over the [facility] medication. ?Secondary Dressing ?Zetuvit Plus 4x8 in ?Discharge Instruction: Apply over primary dressing as directed. ?Secured With ?Compression Wrap ?ThreePress (3 layer compression wrap) ?Discharge Instruction: Apply three layer compression as directed. ?Compression Stockings ?Add-Ons ?Electronic Signature(s) ?Signed: 02/04/2022 12:44:27 PM By: Kalman Shan DO ?Signed: 02/05/2022 12:57:05 PM By: [name] ?Entered By: Kalman Shan on 02/04/2022  12:41:01 ?-------------------------------------------------------------------------------- ?Multi-Disciplinary Care Plan Details ?Patient Name: ?Date of Service: ?Carrizales, [name] 02/04/2022 10:15 A M ?Medical Record Number: [id number] ?Fraser Din

## 2022-02-11 ENCOUNTER — Other Ambulatory Visit: Payer: Self-pay

## 2022-02-11 ENCOUNTER — Encounter (HOSPITAL_BASED_OUTPATIENT_CLINIC_OR_DEPARTMENT_OTHER): Payer: Medicare PPO | Admitting: Internal Medicine

## 2022-02-11 DIAGNOSIS — L97822 Non-pressure chronic ulcer of other part of left lower leg with fat layer exposed: Secondary | ICD-10-CM

## 2022-02-11 DIAGNOSIS — I110 Hypertensive heart disease with heart failure: Secondary | ICD-10-CM | POA: Diagnosis not present

## 2022-02-11 DIAGNOSIS — I87312 Chronic venous hypertension (idiopathic) with ulcer of left lower extremity: Secondary | ICD-10-CM | POA: Diagnosis not present

## 2022-02-11 DIAGNOSIS — I509 Heart failure, unspecified: Secondary | ICD-10-CM | POA: Diagnosis not present

## 2022-02-11 DIAGNOSIS — C50919 Malignant neoplasm of unspecified site of unspecified female breast: Secondary | ICD-10-CM | POA: Diagnosis not present

## 2022-02-11 DIAGNOSIS — Z9221 Personal history of antineoplastic chemotherapy: Secondary | ICD-10-CM | POA: Diagnosis not present

## 2022-02-11 DIAGNOSIS — G309 Alzheimer's disease, unspecified: Secondary | ICD-10-CM | POA: Diagnosis not present

## 2022-02-11 DIAGNOSIS — I5042 Chronic combined systolic (congestive) and diastolic (congestive) heart failure: Secondary | ICD-10-CM | POA: Diagnosis not present

## 2022-02-11 DIAGNOSIS — C541 Malignant neoplasm of endometrium: Secondary | ICD-10-CM | POA: Diagnosis not present

## 2022-02-12 NOTE — Progress Notes (Signed)
GWENDALYN, MCGONAGLE ([id number]) ?Visit Report for 02/11/2022 ?Chief Complaint Document Details ?Patient Name: Date of Service: ?Joswick, [name] 02/11/2022 10:15 A M ?Medical Record Number: [id number] ?Patient Account Number: [id number] ?Date of Birth/Sex: Treating RN: ?06/28/1925 (88 y.o. [name], [name] ?Primary Care Provider: Marda Stalker Other Clinician: ?Referring Provider: ?Treating Provider/Extender: Kalman Shan ?Marda Stalker ?Weeks in Treatment: 10 ?Information Obtained from: Patient ?Chief Complaint ?Left lower extremity wound ?Electronic Signature(s) ?Signed: 02/11/2022 11:32:10 AM By: Kalman Shan DO ?Entered By: Kalman Shan on 02/11/2022 11:14:31 ?-------------------------------------------------------------------------------- ?Debridement Details ?Patient Name: Date of Service: ?Peedin, [name] 02/11/2022 10:15 A M ?Medical Record Number: [id number] ?Patient Account Number: [id number] ?Date of Birth/Sex: Treating RN: ?06-03-1925 (88 y.o. [name] ?Primary Care Provider: Marda Stalker Other Clinician: ?Referring Provider: ?Treating Provider/Extender: Kalman Shan ?Marda Stalker ?Weeks in Treatment: 10 ?Debridement Performed for Assessment: Wound #26 Left,Lateral Ankle ?Performed By: Physician Kalman Shan, DO ?Debridement Type: Debridement ?Severity of Tissue Pre Debridement: Fat layer exposed ?Level of Consciousness (Pre-procedure): Awake and Alert ?Pre-procedure Verification/Time Out Yes - 10:39 ?Taken: ?Start Time: 10:40 ?Pain Control: ?Other : Benzocaine ?T Area Debrided (L x W): ?otal 3.3 (cm) x 3 (cm) = 9.9 (cm?) ?Tissue and other material debrided: ?Non-Viable, Slough, Subcutaneous, Skin: Dermis , Slough ?Level: Skin/Subcutaneous Tissue ?Debridement Description: Excisional ?Instrument: Curette ?Bleeding: Minimum ?Hemostasis Achieved: Pressure ?End Time: 10:45 ?Response to Treatment: Procedure was tolerated well ?Level of Consciousness (Post- Awake  and Alert ?procedure): ?Post Debridement Measurements of Total Wound ?Length: (cm) 3.3 ?Width: (cm) 3 ?Depth: (cm) 0.1 ?Volume: (cm?) 0.778 ?Character of Wound/Ulcer Post Debridement: Stable ?Severity of Tissue Post Debridement: Fat layer exposed ?Post Procedure Diagnosis ?Same as Pre-procedure ?Electronic Signature(s) ?Signed: 02/11/2022 11:32:10 AM By: Kalman Shan DO ?Signed: 02/11/2022 4:43:06 PM By: Lorrin Jackson ?Entered By: Lorrin Jackson on 02/11/2022 10:46:02 ?-------------------------------------------------------------------------------- ?HPI Details ?Patient Name: Date of Service: ?Auriemma, [name] 02/11/2022 10:15 A M ?Medical Record Number: [id number] ?Patient Account Number: [id number] ?Date of Birth/Sex: Treating RN: ?Sep 20, 1925 (88 y.o. [name], [name] ?Primary Care Provider: Marda Stalker Other Clinician: ?Referring Provider: ?Treating Provider/Extender: Kalman Shan ?Marda Stalker ?Weeks in Treatment: 10 ?History of Present Illness ?HPI Description: this is a patient we know from several prior wounds on her bilateral lower extremities. She has venous stasis physiology, inflammation and ?hypertension. She is been fully evaluated for venous ablation and was found not to be a candidate. She does not have significant arterial disease. ?02/14/15 the wound itself on her lateral left leg did not look too bad however there was surrounding maceration which was concerning ?02/27/15 the wound itself is small with surrounding circumferential epithelialization there is no surrounding maceration. ?03/06/15 only a very small area remains. I think this is mostly epithelialized however I would be uncomfortable not dressing this this week ?03/13/15 the area is epithelialized. There was a thick surface on this. I took a #15 blade then lightly disrupted it but there does not appear to be any open area I ?did not see anything but appropriate epithelium ?Readmission: ?08/16/18 on evaluation today patient  presents for initial evaluation and our clinic released readmission although she has not been seen since April 2016. ?Nonetheless she is having issues with the ulcers currently on the bilateral lateral malleolus or locations as well as the left lateral lower extremity. These have ?been present for roughly 3 weeks since the patient actually developed significant bilateral lower extremity edema which patient's daughter states was roughly 3 ?times the size of her legs currently. Subsequently she ended up in the  emerging department due to congestive heart failure. They were able to get the swelling ?under control and fortunately she seems to be doing much better at this point in time. She was placed in an [employer] which she sat on for week part ?evaluation today as well. Fortunately the wound areas actually seem to be doing fairly well there is some macerated skin surrounding there are the ?areas/surface of the wound which are gonna require some debridement at this point. No fevers, chills, nausea, or vomiting noted at this time. Patient has a ?history of hypertension, mild peripheral vascular disease, chronic venous stasis, and other than the ulcer seems to be doing fairly well today. No fevers, ?chills, nausea, or vomiting noted at this time. ?08/23/18 on evaluation today patient actually appears to be doing great in regard to her bilateral lower should be ulcers. She's shown signs of improvement even ?just with one week with the [employer]. In general I'm actually very pleased with how things appear at this point. ?09/06/18 on evaluation today patient actually appears to be doing much better her left lower extremity is completely healed. Go to the right lower extremity ?lateral malleolus ulcer this still seems to be showing signs of being open. There does not appear to be evidence of infection which is good news. This did require ?some sharp debridement however to remove some of the necrotic tissue/eschar  on the surface of the wound ?Readmission 11/27/2021 ?Ms. Naelani Lafrance is a 88 year old female with a past medical history of endometrial and breast cancer, chronic venous insufficiency, dementia and ?essential hypertension that presents to the clinic with a 1 month history of nonhealing ulcer to the left lower extremity. She states that the ulcer opened 1 ?month ago and then healed but has reopened again in the past week. She is currently keeping the area covered. She has a history of wounds to her lower ?extremities. She has compression stockings that she uses daily. She currently denies signs of infection. ?1/13; patient presents for follow-up. She followed up for nurse visit for wrap change. She reports more odor. She denies pain. ?1/20; patient presents for follow-up. She has been using gentamicin ointment with calcium alginate to the wound bed. She reports improvement in odor and ?drainage. She currently denies systemic signs of infection. ?1/27; patient presents for follow-up. She has been using gentamicin ointment with calcium alginate. She received her Keystone antibiotic 2 days ago. She ?brought this in. Home health has also been established. They will come out for the first time next week. She currently denies signs of infection. She has no ?issues or complaints today. ?2/2; patient presents for follow-up. She has been using Keystone antibiotics with [facility] with no issues. She denies signs of infection. She has no ?issues or complaints today. ?2/9; patient presents for follow-up. She has been using Keystone antibiotics with [facility] with no issues. She has tolerated the compression wrap well. ?She denies signs of infection. ?2/16; patient presents for follow-up. She has been using Keystone antibiotics with Hydrofera Blue under compression wrap with no issues. She currently denies ?signs of infection. ?2/23; patient presents for follow-up. She continues to use Keystone antibiotics with  Hydrofera Blue under the compression wrap. She has no issues or ?complaints today. She denies signs of infection. Home health continues to come out and change the wrap. ?3/2; patient presents for follow-up. Sh

## 2022-02-12 NOTE — Progress Notes (Signed)
TIYANNA, [name] ([id number]) ?Visit Report for 02/11/2022 ?Arrival Information Details ?Patient Name: Date of Service: ?[name], [name] [name] 02/11/2022 10:15 A M ?Medical Record Number: [id number] ?Patient Account Number: [id number] ?Date of Birth/Sex: Treating RN: ?08-27-1925 (88 y.o. [name] ?Primary Care Ayeisha Lindenberger: Marda Stalker Other Clinician: ?Referring Jocelyn Lowery: ?Treating Abdimalik Mayorquin/Extender: Kalman Shan ?Marda Stalker ?Weeks in Treatment: 10 ?Visit Information History Since Last Visit ?Added or deleted any medications: No ?Patient Arrived: Wheel Chair ?Any new allergies or adverse reactions: No ?Arrival Time: 10:18 ?Had a fall or experienced change in No ?Accompanied By: daughter ?activities of daily living that may affect ?Transfer Assistance: Manual ?risk of falls: ?Patient Identification Verified: Yes ?Signs or symptoms of abuse/neglect since last visito No ?Secondary Verification Process Completed: Yes ?Hospitalized since last visit: No ?Patient Requires Transmission-Based Precautions: No ?Implantable device outside of the clinic excluding No ?Patient Has Alerts: No ?cellular tissue based products placed in the center ?since last visit: ?Has Dressing in Place as Prescribed: Yes ?Has Compression in Place as Prescribed: Yes ?Pain Present Now: No ?Electronic Signature(s) ?Signed: 02/11/2022 4:43:06 PM By: Lorrin Jackson ?Entered By: Lorrin Jackson on 02/11/2022 10:22:28 ?-------------------------------------------------------------------------------- ?Compression Therapy Details ?Patient Name: Date of Service: ?[name], [name] [name] 02/11/2022 10:15 A M ?Medical Record Number: [id number] ?Patient Account Number: [id number] ?Date of Birth/Sex: Treating RN: ?08/02/1925 (88 y.o. [name] ?Primary Care Sybrina Laning: Marda Stalker Other Clinician: ?Referring Karrina Lye: ?Treating Enyah Moman/Extender: Kalman Shan ?Marda Stalker ?Weeks in Treatment: 10 ?Compression Therapy Performed for Wound  Assessment: Wound #26 Left,Lateral Ankle ?Performed By: Clinician Lorrin Jackson, RN ?Compression Type: Three Layer ?Post Procedure Diagnosis ?Same as Pre-procedure ?Electronic Signature(s) ?Signed: 02/11/2022 4:43:06 PM By: Lorrin Jackson ?Entered By: Lorrin Jackson on 02/11/2022 10:45:00 ?-------------------------------------------------------------------------------- ?Encounter Discharge Information Details ?Patient Name: ?Date of Service: ?[name], [name] [name] 02/11/2022 10:15 A M ?Medical Record Number: [id number] ?Patient Account Number: [id number] ?Date of Birth/Sex: ?Treating RN: ?March 18, 1925 (88 y.o. [name] ?Primary Care Xane Amsden: Marda Stalker ?Other Clinician: ?Referring Gaelyn Tukes: ?Treating Grettel Rames/Extender: Kalman Shan ?Marda Stalker ?Weeks in Treatment: 10 ?Encounter Discharge Information Items Post Procedure Vitals ?Discharge Condition: Stable ?Temperature (F): 97.8 ?Ambulatory Status: Wheelchair ?Pulse (bpm): 55 ?Discharge Destination: Home ?Respiratory Rate (breaths/min): 18 ?Transportation: Private Auto ?Blood Pressure (mmHg): 126/74 ?Accompanied By: daughter ?Schedule Follow-up Appointment: Yes ?Clinical Summary of Care: Provided on 02/11/2022 ?Form Type Recipient ?Paper Patient Patient ?Electronic Signature(s) ?Signed: 02/11/2022 11:29:20 AM By: Lorrin Jackson ?Entered By: Lorrin Jackson on 02/11/2022 11:29:19 ?-------------------------------------------------------------------------------- ?Lower Extremity Assessment Details ?Patient Name: ?Date of Service: ?[name], [name] [name] 02/11/2022 10:15 A M ?Medical Record Number: [id number] ?Patient Account Number: [id number] ?Date of Birth/Sex: ?Treating RN: ?17-Nov-1925 (88 y.o. [name] ?Primary Care Jonahtan Manseau: Marda Stalker ?Other Clinician: ?Referring Ersel Wadleigh: ?Treating Dyneisha Murchison/Extender: Kalman Shan ?Marda Stalker ?Weeks in Treatment: 10 ?Edema Assessment ?Assessed: [Left: Yes] [Right: No] ?Edema: [Left: N] [Right:  o] ?Calf ?Left: Right: ?Point of Measurement: 30 cm From Medial Instep 28 cm ?Ankle ?Left: Right: ?Point of Measurement: 11 cm From Medial Instep 20.5 cm ?Vascular Assessment ?Pulses: ?Dorsalis Pedis ?Palpable: [Left:Yes] ?Electronic Signature(s) ?Signed: 02/11/2022 4:43:06 PM By: Lorrin Jackson ?Entered By: Lorrin Jackson on 02/11/2022 10:32:44 ?-------------------------------------------------------------------------------- ?Multi Wound Chart Details ?Patient Name: ?Date of Service: ?[name], [name] [name] 02/11/2022 10:15 A M ?Medical Record Number: [id number] ?Patient Account Number: [id number] ?Date of Birth/Sex: ?Treating RN: ?12-Dec-1924 (89 y.o. [name], [name] ?Primary Care Jerman Tinnon: Marda Stalker ?Other Clinician: ?Referring Tomasita Beevers: ?Treating Mayari Matus/Extender: Kalman Shan ?Marda Stalker ?Weeks in Treatment: 10 ?Vital Signs ?Height(in): 66 ?Pulse(bpm): 55 ?Weight(lbs): 176 ?Blood Pressure(mmHg): 126/74 ?Body  Mass Index(BMI): 28.4 ?Temperature(??F): 97.8 ?Respiratory Rate(breaths/min): 18 ?Photos: [N/A:N/A] ?Left, Lateral Ankle N/A N/A ?Wound Location: ?Gradually Appeared N/A N/A ?Wounding Event: ?Venous Leg Ulcer N/A N/A ?Primary Etiology: ?Anemia, Congestive Heart Failure, N/A N/A ?Comorbid History: ?Hypertension, Peripheral Venous ?Disease, Osteoarthritis, Received ?Chemotherapy ?11/20/2021 N/A N/A ?Date Acquired: ?49 N/A N/A ?Weeks of Treatment: ?Open N/A N/A ?Wound Status: ?No N/A N/A ?Wound Recurrence: ?3.3x3x0.1 N/A N/A ?Measurements L x W x D (cm) ?7.775 N/A N/A ?A (cm?) : ?rea ?0.778 N/A N/A ?Volume (cm?) : ?17.50% N/A N/A ?% Reduction in A rea: ?17.40% N/A N/A ?% Reduction in Volume: ?Full Thickness Without Exposed N/A N/A ?Classification: ?Support Structures ?Medium N/A N/A ?Exudate A mount: ?Serosanguineous N/A N/A ?Exudate Type: ?red, brown N/A N/A ?Exudate Color: ?Distinct, outline attached N/A N/A ?Wound Margin: ?Large (67-100%) N/A N/A ?Granulation A mount: ?Red N/A  N/A ?Granulation Quality: ?Small (1-33%) N/A N/A ?Necrotic A mount: ?Fat Layer (Subcutaneous Tissue): Yes N/A N/A ?Exposed Structures: ?Fascia: No ?Tendon: No ?Muscle: No ?Joint: No ?Bone: No ?Small (1-33%) N/A N/A ?Epithelialization: ?Debridement - Excisional N/A N/A ?Debridement: ?Pre-procedure Verification/Time Out 10:39 N/A N/A ?Taken: ?Other N/A N/A ?Pain Control: ?Subcutaneous, Slough N/A N/A ?Tissue Debrided: ?Skin/Subcutaneous Tissue N/A N/A ?Level: ?9.9 N/A N/A ?Debridement A (sq cm): ?rea ?Curette N/A N/A ?Instrument: ?Minimum N/A N/A ?Bleeding: ?Pressure N/A N/A ?Hemostasis A chieved: ?Procedure was tolerated well N/A N/A ?Debridement Treatment Response: ?3.3x3x0.1 N/A N/A ?Post Debridement Measurements L x ?W x D (cm) ?0.778 N/A N/A ?Post Debridement Volume: (cm?) ?Compression Therapy N/A N/A ?Procedures Performed: ?Debridement ?Treatment Notes ?Electronic Signature(s) ?Signed: 02/11/2022 11:32:10 AM By: Kalman Shan DO ?Signed: 02/12/2022 12:10:41 PM By: [name] ?Entered By: Kalman Shan on 02/11/2022 11:13:38 ?-------------------------------------------------------------------------------- ?Multi-Disciplinary Care Plan Details ?Patient Name: ?Date of Service: ?[name], [name] [name] 02/11/2022 10:15 A M ?Medical Record Number: [id number] ?Patient Account Number: [id number] ?Date of Birth/Sex: ?Treating RN: ?1925/10/02 (88 y.o. [name] ?Primary Care Delylah Stanczyk: Marda Stalker ?Other Clinician: ?Referring Kenner Lewan: ?Treating Amilya Haver/Extender: Kalman Shan ?Marda Stalker ?Weeks in Treatment: 10 ?Active Inactive ?Nutrition ?Nursing Diagnoses: ?Imbalanced nutrition ?Goals: ?Patient/caregiver agrees to and verbalizes understanding of need to use nutritional supplements and/or vitamins as prescribed ?Date Initiated: 11/27/2021 ?Target Resolution Date: 02/20/2022 ?Goal Status: Active ?Interventions: ?Assess patient nutrition upon admission and as needed per policy ?Provide education  on nutrition ?Treatment Activities: ?Education provided on Nutrition : 02/04/2022 ?Notes: ?Wound/Skin Impairment ?Nursing Diagnoses: ?Impaired tissue integrity ?Goals: ?Patient/caregiver will verbalize understanding of

## 2022-02-18 ENCOUNTER — Encounter (HOSPITAL_BASED_OUTPATIENT_CLINIC_OR_DEPARTMENT_OTHER): Payer: Medicare PPO | Admitting: Internal Medicine

## 2022-02-18 DIAGNOSIS — L97822 Non-pressure chronic ulcer of other part of left lower leg with fat layer exposed: Secondary | ICD-10-CM

## 2022-02-18 DIAGNOSIS — I87312 Chronic venous hypertension (idiopathic) with ulcer of left lower extremity: Secondary | ICD-10-CM | POA: Diagnosis not present

## 2022-02-18 DIAGNOSIS — G309 Alzheimer's disease, unspecified: Secondary | ICD-10-CM | POA: Diagnosis not present

## 2022-02-18 DIAGNOSIS — I110 Hypertensive heart disease with heart failure: Secondary | ICD-10-CM | POA: Diagnosis not present

## 2022-02-18 DIAGNOSIS — C541 Malignant neoplasm of endometrium: Secondary | ICD-10-CM | POA: Diagnosis not present

## 2022-02-18 DIAGNOSIS — I509 Heart failure, unspecified: Secondary | ICD-10-CM | POA: Diagnosis not present

## 2022-02-18 DIAGNOSIS — C50919 Malignant neoplasm of unspecified site of unspecified female breast: Secondary | ICD-10-CM | POA: Diagnosis not present

## 2022-02-18 DIAGNOSIS — I5042 Chronic combined systolic (congestive) and diastolic (congestive) heart failure: Secondary | ICD-10-CM | POA: Diagnosis not present

## 2022-02-18 DIAGNOSIS — Z9221 Personal history of antineoplastic chemotherapy: Secondary | ICD-10-CM | POA: Diagnosis not present

## 2022-02-19 NOTE — Progress Notes (Signed)
[name], [name] ([id number]) ?Visit Report for 02/18/2022 ?Arrival Information Details ?Patient Name: Date of Service: ?[name], [name] 02/18/2022 12:30 PM ?Medical Record Number: [id number] ?Patient Account Number: [id number] ?Date of Birth/Sex: Treating RN: ?1925-03-16 (88 y.o. [name], [name] ?Primary Care Katharina Jehle: Marda Stalker Other Clinician: ?Referring Dann Ventress: ?Treating Joandry Slagter/Extender: Kalman Shan ?Marda Stalker ?Weeks in Treatment: 11 ?Visit Information History Since Last Visit ?Added or deleted any medications: No ?Patient Arrived: Wheel Chair ?Any new allergies or adverse reactions: No ?Arrival Time: 12:39 ?Had a fall or experienced change in No ?Accompanied By: daughter ?activities of daily living that may affect ?Transfer Assistance: Manual ?risk of falls: ?Patient Identification Verified: Yes ?Signs or symptoms of abuse/neglect since last visito No ?Secondary Verification Process Completed: Yes ?Hospitalized since last visit: No ?Patient Requires Transmission-Based Precautions: No ?Implantable device outside of the clinic excluding No ?Patient Has Alerts: No ?cellular tissue based products placed in the center ?since last visit: ?Has Dressing in Place as Prescribed: Yes ?Has Compression in Place as Prescribed: Yes ?Pain Present Now: No ?Electronic Signature(s) ?Signed: 02/19/2022 12:49:17 PM By: [name] ?Entered By: Rhae Hammock on 02/18/2022 12:39:24 ?-------------------------------------------------------------------------------- ?Compression Therapy Details ?Patient Name: Date of Service: ?[name], [name] 02/18/2022 12:30 PM ?Medical Record Number: [id number] ?Patient Account Number: [id number] ?Date of Birth/Sex: Treating RN: ?[birth year] (88 y.o. [name], [name] ?Primary Care Cortavius Montesinos: Marda Stalker Other Clinician: ?Referring Aloma Boch: ?Treating Keerthi Hazell/Extender: Kalman Shan ?Marda Stalker ?Weeks in Treatment: 11 ?Compression Therapy  Performed for Wound Assessment: Wound #26 Left,Lateral Ankle ?Performed By: Clinician Rhae Hammock, RN ?Compression Type: Three Layer ?Post Procedure Diagnosis ?Same as Pre-procedure ?Electronic Signature(s) ?Signed: 02/19/2022 12:49:17 PM By: [name] ?Entered By: Rhae Hammock on 02/18/2022 13:05:42 ?-------------------------------------------------------------------------------- ?Encounter Discharge Information Details ?Patient Name: ?Date of Service: ?[name], [name] 02/18/2022 12:30 PM ?Medical Record Number: [id number] ?Patient Account Number: [id number] ?Date of Birth/Sex: ?Treating RN: ?07/06/1925 (88 y.o. [name], [name] ?Primary Care Maansi Wike: Marda Stalker ?Other Clinician: ?Referring Aysha Livecchi: ?Treating Lupie Sawa/Extender: Kalman Shan ?Marda Stalker ?Weeks in Treatment: 11 ?Encounter Discharge Information Items Post Procedure Vitals ?Discharge Condition: Stable ?Temperature (F): 98.7 ?Ambulatory Status: Wheelchair ?Pulse (bpm): 74 ?Discharge Destination: Home ?Respiratory Rate (breaths/min): 17 ?Transportation: Private Auto ?Blood Pressure (mmHg): 134/74 ?Accompanied By: daughter ?Schedule Follow-up Appointment: Yes ?Clinical Summary of Care: Patient Declined ?Electronic Signature(s) ?Signed: 02/19/2022 12:49:17 PM By: [name] ?Entered By: Rhae Hammock on 02/18/2022 13:06:41 ?-------------------------------------------------------------------------------- ?Lower Extremity Assessment Details ?Patient Name: ?Date of Service: ?[name], [name] 02/18/2022 12:30 PM ?Medical Record Number: [id number] ?Patient Account Number: [id number] ?Date of Birth/Sex: ?Treating RN: ?05-28-1925 (88 y.o. [name], [name] ?Primary Care Geneieve Duell: Marda Stalker ?Other Clinician: ?Referring [place]: ?Treating Audrena Talaga/Extender: Kalman Shan ?Marda Stalker ?Weeks in Treatment: 11 ?Edema Assessment ?Assessed: [Left: Yes] [Right: No] ?Edema: [Left: N] [Right:  o] ?Calf ?Left: Right: ?Point of Measurement: 30 cm From Medial Instep 27.5 cm ?Ankle ?Left: Right: ?Point of Measurement: 11 cm From Medial Instep 20 cm ?Vascular Assessment ?Pulses: ?Dorsalis Pedis ?Palpable: [Left:Yes] ?Posterior Tibial ?Palpable: [Left:Yes] ?Electronic Signature(s) ?Signed: 02/19/2022 12:49:17 PM By: [name] ?Entered By: Rhae Hammock on 02/18/2022 13:02:14 ?-------------------------------------------------------------------------------- ?Multi Wound Chart Details ?Patient Name: ?Date of Service: ?[name], [name] 02/18/2022 12:30 PM ?Medical Record Number: [id number] ?Patient Account Number: [id number] ?Date of Birth/Sex: ?Treating RN: ?Nov 05, 1925 (88 y.o. [name], [name] ?Primary Care Lenor Provencher: Marda Stalker ?Other Clinician: ?Referring Hiyab Nhem: ?Treating Marqui Formby/Extender: Kalman Shan ?Marda Stalker ?Weeks in Treatment: 11 ?Vital Signs ?Height(in): 66 ?Pulse(bpm): 58 ?Weight(lbs): 176 ?Blood Pressure(mmHg): 124/74 ?Body Mass Index(BMI): 28.4 ?Temperature(??F):  97.7 ?Respiratory Rate(breaths/min): 17 ?Photos: [N/A:N/A] ?Left, Lateral Ankle N/A N/A ?Wound Location: ?Gradually Appeared N/A N/A ?Wounding Event: ?Venous Leg Ulcer N/A N/A ?Primary Etiology: ?Anemia, Congestive Heart Failure, N/A N/A ?Comorbid History: ?Hypertension, Peripheral Venous ?Disease, Osteoarthritis, Received ?Chemotherapy ?11/20/2021 N/A N/A ?Date Acquired: ?71 N/A N/A ?Weeks of Treatment: ?Open N/A N/A ?Wound Status: ?No N/A N/A ?Wound Recurrence: ?3.2x2.4x0.1 N/A N/A ?Measurements L x W x D (cm) ?6.032 N/A N/A ?A (cm?) : ?rea ?0.603 N/A N/A ?Volume (cm?) : ?36.00% N/A N/A ?% Reduction in A rea: ?36.00% N/A N/A ?% Reduction in Volume: ?Full Thickness Without Exposed N/A N/A ?Classification: ?Support Structures ?Medium N/A N/A ?Exudate A mount: ?Serosanguineous N/A N/A ?Exudate Type: ?red, brown N/A N/A ?Exudate Color: ?Distinct, outline attached N/A N/A ?Wound Margin: ?Large (67-100%) N/A  N/A ?Granulation A mount: ?Red N/A N/A ?Granulation Quality: ?Small (1-33%) N/A N/A ?Necrotic A mount: ?Fat Layer (Subcutaneous Tissue): Yes N/A N/A ?Exposed Structures: ?Fascia: No ?Tendon: No ?Muscle: No ?Joint: No ?Bone: No ?Small (1-33%) N/A N/A ?Epithelialization: ?Debridement - Selective/Open Wound N/A N/A ?Debridement: ?Pre-procedure Verification/Time Out 13:04 N/A N/A ?Taken: ?Lidocaine N/A N/A ?Pain Control: ?Skin/Epidermis N/A N/A ?Level: ?7.68 N/A N/A ?Debridement A (sq cm): ?rea ?Curette N/A N/A ?Instrument: ?Minimum N/A N/A ?Bleeding: ?Pressure N/A N/A ?Hemostasis A chieved: ?0 N/A N/A ?Procedural Pain: ?0 N/A N/A ?Post Procedural Pain: ?Procedure was tolerated well N/A N/A ?Debridement Treatment Response: ?3.2x2.4x0.1 N/A N/A ?Post Debridement Measurements L x ?W x D (cm) ?0.603 N/A N/A ?Post Debridement Volume: (cm?) ?Compression Therapy N/A N/A ?Procedures Performed: ?Debridement ?Treatment Notes ?Wound #26 (Ankle) Wound Laterality: Left, Lateral ?Cleanser ?Soap and Water ?Discharge Instruction: May shower and wash wound with dial antibacterial soap and water prior to dressing change. ?Peri-Wound Care ?Sween Lotion (Moisturizing lotion) ?Discharge Instruction: Apply moisturizing lotion as directed ?Topical ?Keystone topical Antibiotics ?Discharge Instruction: Mix medication according to pharmacy instruction apply to wound bed. ?Primary Dressing ?Hydrofera Blue Classic Foam, 4x4 in ?Discharge Instruction: Moisten with saline apply over the [facility] medication. ?Secondary Dressing ?Zetuvit Plus 4x8 in ?Discharge Instruction: Apply over primary dressing as directed. ?Secured With ?Compression Wrap ?ThreePress (3 layer compression wrap) ?Discharge Instruction: Apply three layer compression as directed. ?Compression Stockings ?Add-Ons ?Electronic Signature(s) ?Signed: 02/18/2022 1:20:48 PM By: Kalman Shan DO ?Signed: 02/19/2022 12:49:17 PM By: [name] ?Entered By: Kalman Shan on  02/18/2022 13:17:13 ?-------------------------------------------------------------------------------- ?Multi-Disciplinary Care Plan Details ?Patient Name: ?Date of Service: ?[name], [name] 02/18/2022 12:30 PM ?M

## 2022-02-19 NOTE — Progress Notes (Signed)
[name] ([id number]) ?Visit Report for 02/18/2022 ?Chief Complaint Document Details ?Patient Name: Date of Service: ?[name] 02/18/2022 12:30 PM ?Medical Record Number: [id number] ?Patient Account Number: [id number] ?Date of Birth/Sex: Treating [name]: ?November 08, 1925 (88 y.o. [name], [name] ?Primary Care Provider: Marda [name] Other Clinician: ?Referring Provider: ?Treating Provider/Extender: [name] ?[name] ?Weeks in Treatment: 11 ?Information Obtained from: Patient ?Chief Complaint ?Left lower extremity wound ?Electronic Signature(s) ?Signed: 02/18/2022 1:20:48 PM By: [name] Shan DO ?Entered By: [name] on 02/18/2022 13:17:26 ?-------------------------------------------------------------------------------- ?Debridement Details ?Patient Name: Date of Service: ?[name] 02/18/2022 12:30 PM ?Medical Record Number: [id number] ?Patient Account Number: [id number] ?Date of Birth/Sex: Treating [name]: ?22-Aug-1925 (88 y.o. [name], [name] ?Primary Care Provider: Marda [name] Other Clinician: ?Referring Provider: ?Treating Provider/Extender: [name] ?[name] ?Weeks in Treatment: 11 ?Debridement Performed for Assessment: Wound #26 Left,Lateral Ankle ?Performed By: Physician [name] Shan, DO ?Debridement Type: Debridement ?Severity of Tissue Pre Debridement: Fat layer exposed ?Level of Consciousness (Pre-procedure): Awake and Alert ?Pre-procedure Verification/Time Out Yes - 13:04 ?Taken: ?Start Time: 13:04 ?Pain Control: Lidocaine ?T Area Debrided (L x W): ?otal 3.2 (cm) x 2.4 (cm) = 7.68 (cm?) ?Tissue and other material debrided: ?Viable, Non-Viable, Skin: Dermis , Skin: Epidermis ?Level: Skin/Epidermis ?Debridement Description: Selective/Open Wound ?Instrument: Curette ?Bleeding: Minimum ?Hemostasis Achieved: Pressure ?End Time: 13:04 ?Procedural Pain: 0 ?Post Procedural Pain: 0 ?Response to Treatment: Procedure was tolerated well ?Level  of Consciousness (Post- Awake and Alert ?procedure): ?Post Debridement Measurements of Total Wound ?Length: (cm) 3.2 ?Width: (cm) 2.4 ?Depth: (cm) 0.1 ?Volume: (cm?) 0.603 ?Character of Wound/Ulcer Post Debridement: Improved ?Severity of Tissue Post Debridement: Fat layer exposed ?Post Procedure Diagnosis ?Same as Pre-procedure ?Electronic Signature(s) ?Signed: 02/18/2022 1:20:48 PM By: [name] Shan DO ?Signed: 02/19/2022 12:49:17 PM By: [name] ?Entered By: [name] on 02/18/2022 13:05:26 ?-------------------------------------------------------------------------------- ?HPI Details ?Patient Name: Date of Service: ?[name] 02/18/2022 12:30 PM ?Medical Record Number: [id number] ?Patient Account Number: [id number] ?Date of Birth/Sex: Treating [name]: ?02/15/1925 (88 y.o. [name], [name] ?Primary Care Provider: Marda [name] Other Clinician: ?Referring Provider: ?Treating Provider/Extender: [name] ?[name] ?Weeks in Treatment: 11 ?History of Present Illness ?HPI Description: this is a patient we know from several prior wounds on her bilateral lower extremities. She has venous stasis physiology, inflammation and ?hypertension. She is been fully evaluated for venous ablation and was found not to be a candidate. She does not have significant arterial disease. ?02/14/15 the wound itself on her lateral left leg did not look too bad however there was surrounding maceration which was concerning ?02/27/15 the wound itself is small with surrounding circumferential epithelialization there is no surrounding maceration. ?03/06/15 only a very small area remains. I think this is mostly epithelialized however I would be uncomfortable not dressing this this week ?03/13/15 the area is epithelialized. There was a thick surface on this. I took a #15 blade then lightly disrupted it but there does not appear to be any open area I ?did not see anything but appropriate  epithelium ?Readmission: ?08/16/18 on evaluation today patient presents for initial evaluation and our clinic released readmission although she has not been seen since April 2016. ?Nonetheless she is having issues with the ulcers currently on the bilateral lateral malleolus or locations as well as the left lateral lower extremity. These have ?been present for roughly 3 weeks since the patient actually developed significant bilateral lower extremity edema which patient's daughter states was roughly 3 ?times the size of her legs currently. Subsequently she ended  up in the emerging department due to congestive heart failure. They were able to get the swelling ?under control and fortunately she seems to be doing much better at this point in time. She was placed in an [employer] which she sat on for week part ?evaluation today as well. Fortunately the wound areas actually seem to be doing fairly well there is some macerated skin surrounding there are the ?areas/surface of the wound which are gonna require some debridement at this point. No fevers, chills, nausea, or vomiting noted at this time. Patient has a ?history of hypertension, mild peripheral vascular disease, chronic venous stasis, and other than the ulcer seems to be doing fairly well today. No fevers, ?chills, nausea, or vomiting noted at this time. ?08/23/18 on evaluation today patient actually appears to be doing great in regard to her bilateral lower should be ulcers. She's shown signs of improvement even ?just with one week with the [employer]. In general I'm actually very pleased with how things appear at this point. ?09/06/18 on evaluation today patient actually appears to be doing much better her left lower extremity is completely healed. Go to the right lower extremity ?lateral malleolus ulcer this still seems to be showing signs of being open. There does not appear to be evidence of infection which is good news. This did require ?some sharp  debridement however to remove some of the necrotic tissue/eschar on the surface of the wound ?Readmission 11/27/2021 ?[name] is a 88 year old female with a past medical history of endometrial and breast cancer, chronic venous insufficiency, dementia and ?essential hypertension that presents to the clinic with a 1 month history of nonhealing ulcer to the left lower extremity. She states that the ulcer opened 1 ?month ago and then healed but has reopened again in the past week. She is currently keeping the area covered. She has a history of wounds to her lower ?extremities. She has compression stockings that she uses daily. She currently denies signs of infection. ?1/13; patient presents for follow-up. She followed up for nurse visit for wrap change. She reports more odor. She denies pain. ?1/20; patient presents for follow-up. She has been using gentamicin ointment with calcium alginate to the wound bed. She reports improvement in odor and ?drainage. She currently denies systemic signs of infection. ?1/27; patient presents for follow-up. She has been using gentamicin ointment with calcium alginate. She received her Keystone antibiotic 2 days ago. She ?brought this in. Home health has also been established. They will come out for the first time next week. She currently denies signs of infection. She has no ?issues or complaints today. ?2/2; patient presents for follow-up. She has been using Keystone antibiotics with [facility] with no issues. She denies signs of infection. She has no ?issues or complaints today. ?2/9; patient presents for follow-up. She has been using Keystone antibiotics with [facility] with no issues. She has tolerated the compression wrap well. ?She denies signs of infection. ?2/16; patient presents for follow-up. She has been using Keystone antibiotics with Hydrofera Blue under compression wrap with no issues. She currently denies ?signs of infection. ?2/23; patient presents  for follow-up. She continues to use Keystone antibiotics with Hydrofera Blue under the compression wrap. She has no issues or ?complaints today. She denies signs of infection. Home health continues to come out and change the w

## 2022-03-04 ENCOUNTER — Encounter (HOSPITAL_BASED_OUTPATIENT_CLINIC_OR_DEPARTMENT_OTHER): Payer: Medicare PPO | Attending: Internal Medicine | Admitting: Internal Medicine

## 2022-03-04 DIAGNOSIS — G309 Alzheimer's disease, unspecified: Secondary | ICD-10-CM | POA: Diagnosis not present

## 2022-03-04 DIAGNOSIS — I872 Venous insufficiency (chronic) (peripheral): Secondary | ICD-10-CM | POA: Diagnosis not present

## 2022-03-04 DIAGNOSIS — C50919 Malignant neoplasm of unspecified site of unspecified female breast: Secondary | ICD-10-CM | POA: Insufficient documentation

## 2022-03-04 DIAGNOSIS — I87312 Chronic venous hypertension (idiopathic) with ulcer of left lower extremity: Secondary | ICD-10-CM | POA: Diagnosis not present

## 2022-03-04 DIAGNOSIS — L97822 Non-pressure chronic ulcer of other part of left lower leg with fat layer exposed: Secondary | ICD-10-CM | POA: Diagnosis not present

## 2022-03-04 DIAGNOSIS — I5042 Chronic combined systolic (congestive) and diastolic (congestive) heart failure: Secondary | ICD-10-CM | POA: Insufficient documentation

## 2022-03-04 DIAGNOSIS — C541 Malignant neoplasm of endometrium: Secondary | ICD-10-CM | POA: Insufficient documentation

## 2022-03-04 DIAGNOSIS — L97322 Non-pressure chronic ulcer of left ankle with fat layer exposed: Secondary | ICD-10-CM | POA: Diagnosis not present

## 2022-03-05 NOTE — Progress Notes (Signed)
NICKEY, CANEDO ([id number]) ?Visit Report for 03/04/2022 ?Debridement Details ?Patient Name: Date of Service: ?[name], [name] 03/04/2022 12:30 PM ?Medical Record Number: [id number] ?Patient Account Number: [id number] ?Date of Birth/Sex: Treating RN: ?[birth year] (88 y.o. [name], [name] ?Primary Care Provider: Marda Stalker Other Clinician: ?Referring Provider: ?Treating Provider/Extender: Linton Ham ?Marda Stalker ?Weeks in Treatment: 13 ?Debridement Performed for Assessment: Wound #26 Left,Lateral Ankle ?Performed By: Physician Ricard Dillon., MD ?Debridement Type: Debridement ?Severity of Tissue Pre Debridement: Fat layer exposed ?Level of Consciousness (Pre-procedure): Awake and Alert ?Pre-procedure Verification/Time Out Yes - 13:15 ?Taken: ?Start Time: 13:15 ?Pain Control: Lidocaine ?T Area Debrided (L x W): ?otal 2.9 (cm) x 2.2 (cm) = 6.38 (cm?) ?Tissue and other material debrided: Viable, Non-Viable, Slough, Subcutaneous, Slough ?Level: Skin/Subcutaneous Tissue ?Debridement Description: Excisional ?Instrument: Curette ?Bleeding: Minimum ?Hemostasis Achieved: Pressure ?End Time: 13:15 ?Procedural Pain: 0 ?Post Procedural Pain: 0 ?Response to Treatment: Procedure was tolerated well ?Level of Consciousness (Post- Awake and Alert ?procedure): ?Post Debridement Measurements of Total Wound ?Length: (cm) 2.9 ?Width: (cm) 2.2 ?Depth: (cm) 0.1 ?Volume: (cm?) 0.501 ?Character of Wound/Ulcer Post Debridement: Improved ?Severity of Tissue Post Debridement: Fat layer exposed ?Post Procedure Diagnosis ?Same as Pre-procedure ?Electronic Signature(s) ?Signed: 03/04/2022 4:28:39 PM By: Linton Ham MD ?Signed: 03/05/2022 12:54:41 PM By: [name] ?Entered By: Linton Ham on 03/04/2022 13:15:13 ?-------------------------------------------------------------------------------- ?HPI Details ?Patient Name: Date of Service: ?Yoakum, [name] 03/04/2022 12:30 PM ?Medical Record Number:  [id number] ?Patient Account Number: [id number] ?Date of Birth/Sex: Treating RN: ?Dec 30, 1924 (88 y.o. [name], [name] ?Primary Care Provider: Marda Stalker Other Clinician: ?Referring Provider: ?Treating Provider/Extender: Linton Ham ?Marda Stalker ?Weeks in Treatment: 13 ?History of Present Illness ?HPI Description: this is a patient we know from several prior wounds on her bilateral lower extremities. She has venous stasis physiology, inflammation and ?hypertension. She is been fully evaluated for venous ablation and was found not to be a candidate. She does not have significant arterial disease. ?02/14/15 the wound itself on her lateral left leg did not look too bad however there was surrounding maceration which was concerning ?02/27/15 the wound itself is small with surrounding circumferential epithelialization there is no surrounding maceration. ?03/06/15 only a very small area remains. I think this is mostly epithelialized however I would be uncomfortable not dressing this this week ?03/13/15 the area is epithelialized. There was a thick surface on this. I took a #15 blade then lightly disrupted it but there does not appear to be any open area I ?did not see anything but appropriate epithelium ?Readmission: ?08/16/18 on evaluation today patient presents for initial evaluation and our clinic released readmission although she has not been seen since April 2016. ?Nonetheless she is having issues with the ulcers currently on the bilateral lateral malleolus or locations as well as the left lateral lower extremity. These have ?been present for roughly 3 weeks since the patient actually developed significant bilateral lower extremity edema which patient's daughter states was roughly 3 ?times the size of her legs currently. Subsequently she ended up in the emerging department due to congestive heart failure. They were able to get the swelling ?under control and fortunately she seems to be doing much better at  this point in time. She was placed in an [employer] which she sat on for week part ?evaluation today as well. Fortunately the wound areas actually seem to be doing fairly well there is some macerated skin surrounding there are the ?areas/surface of the wound which are gonna require some debridement  at this point. No fevers, chills, nausea, or vomiting noted at this time. Patient has a ?history of hypertension, mild peripheral vascular disease, chronic venous stasis, and other than the ulcer seems to be doing fairly well today. No fevers, ?chills, nausea, or vomiting noted at this time. ?08/23/18 on evaluation today patient actually appears to be doing great in regard to her bilateral lower should be ulcers. She's shown signs of improvement even ?just with one week with the [employer]. In general I'm actually very pleased with how things appear at this point. ?09/06/18 on evaluation today patient actually appears to be doing much better her left lower extremity is completely healed. Go to the right lower extremity ?lateral malleolus ulcer this still seems to be showing signs of being open. There does not appear to be evidence of infection which is good news. This did require ?some sharp debridement however to remove some of the necrotic tissue/eschar on the surface of the wound ?Readmission 11/27/2021 ?[name] is a 88 year old female with a past medical history of endometrial and breast cancer, chronic venous insufficiency, dementia and ?essential hypertension that presents to the clinic with a 1 month history of nonhealing ulcer to the left lower extremity. She states that the ulcer opened 1 ?month ago and then healed but has reopened again in the past week. She is currently keeping the area covered. She has a history of wounds to her lower ?extremities. She has compression stockings that she uses daily. She currently denies signs of infection. ?1/13; patient presents for follow-up. She followed  up for nurse visit for wrap change. She reports more odor. She denies pain. ?1/20; patient presents for follow-up. She has been using gentamicin ointment with calcium alginate to the wound bed. She reports improvement in odor and ?drainage. She currently denies systemic signs of infection. ?1/27; patient presents for follow-up. She has been using gentamicin ointment with calcium alginate. She received her Keystone antibiotic 2 days ago. She ?brought this in. Home health has also been established. They will come out for the first time next week. She currently denies signs of infection. She has no ?issues or complaints today. ?2/2; patient presents for follow-up. She has been using Keystone antibiotics with [facility] with no issues. She denies signs of infection. She has no ?issues or complaints today. ?2/9; patient presents for follow-up. She has been using Keystone antibiotics with [facility] with no issues. She has tolerated the compression wrap well. ?She denies signs of infection. ?2/16; patient presents for follow-up. She has been using Keystone antibiotics with Hydrofera Blue under compression wrap with no issues. She currently denies ?signs of infection. ?2/23; patient presents for follow-up. She continues to use Keystone antibiotics with Hydrofera Blue under the compression wrap. She has no issues or ?complaints today. She denies signs of infection. Home health continues to come out and change the wrap. ?3/2; patient presents for follow-up. She continues to use [facility] with [facility] under compression with no issues. She denies signs of infection. ?3/16; patient presents for follow-up. She continues to use [facility] with [facility] under compression with no issues. Home health change the dressing ?last week. She denies signs of infection. ?3/23; patient presents for follow-up. She continues to use Keystone antibiotics with Hydrofera Blue under compression with no issues. Home health  continues ?to change the dressings. She denies signs of infection. ?3/30; patient presents for follow-up. She continues to use Keystone antibiotics with Hydrofera Blue under compression with no issues. Home  health c

## 2022-03-05 NOTE — Progress Notes (Signed)
AMARIYANA, HEACOX ([id number]) ?Visit Report for 03/04/2022 ?Arrival Information Details ?Patient Name: Date of Service: ?Seltzer, [name] 03/04/2022 12:30 PM ?Medical Record Number: [id number] ?Patient Account Number: [id number] ?Date of Birth/Sex: Treating RN: ?August 21, 1925 (88 y.o. [name], [name] ?Primary Care Wynter Grave: Marda Stalker Other Clinician: ?Referring Threasa Kinch: ?Treating Laterrance Nauta/Extender: Linton Ham ?Marda Stalker ?Weeks in Treatment: 13 ?Visit Information History Since Last Visit ?Added or deleted any medications: No ?Patient Arrived: Wheel Chair ?Any new allergies or adverse reactions: No ?Arrival Time: 12:45 ?Had a fall or experienced change in No ?Accompanied By: daughter ?activities of daily living that may affect ?Transfer Assistance: Manual ?risk of falls: ?Patient Identification Verified: Yes ?Signs or symptoms of abuse/neglect since last visito No ?Secondary Verification Process Completed: Yes ?Hospitalized since last visit: No ?Patient Requires Transmission-Based Precautions: No ?Implantable device outside of the clinic excluding No ?Patient Has Alerts: No ?cellular tissue based products placed in the center ?since last visit: ?Has Dressing in Place as Prescribed: Yes ?Has Compression in Place as Prescribed: Yes ?Pain Present Now: No ?Electronic Signature(s) ?Signed: 03/05/2022 12:54:41 PM By: [name] ?Entered By: Rhae Hammock on 03/04/2022 12:45:34 ?-------------------------------------------------------------------------------- ?Compression Therapy Details ?Patient Name: Date of Service: ?Rackers, [name] 03/04/2022 12:30 PM ?Medical Record Number: [id number] ?Patient Account Number: [id number] ?Date of Birth/Sex: Treating RN: ?[birth year] (88 y.o. [name], [name] ?Primary Care Carlon Chaloux: Marda Stalker Other Clinician: ?Referring Lucely Leard: ?Treating Ezrah Dembeck/Extender: Linton Ham ?Marda Stalker ?Weeks in Treatment: 13 ?Compression Therapy  Performed for Wound Assessment: Wound #26 Left,Lateral Ankle ?Performed By: Clinician Rhae Hammock, RN ?Compression Type: Three Layer ?Post Procedure Diagnosis ?Same as Pre-procedure ?Electronic Signature(s) ?Signed: 03/05/2022 12:54:41 PM By: [name] ?Entered By: Rhae Hammock on 03/04/2022 13:16:46 ?-------------------------------------------------------------------------------- ?Encounter Discharge Information Details ?Patient Name: ?Date of Service: ?Anselmi, [name] 03/04/2022 12:30 PM ?Medical Record Number: [id number] ?Patient Account Number: [id number] ?Date of Birth/Sex: ?Treating RN: ?12-14-1924 (88 y.o. [name], [name] ?Primary Care Armando Lauman: Marda Stalker ?Other Clinician: ?Referring Shylyn Younce: ?Treating Arland Usery/Extender: Linton Ham ?Marda Stalker ?Weeks in Treatment: 13 ?Encounter Discharge Information Items Post Procedure Vitals ?Discharge Condition: Stable ?Temperature (F): 98.7 ?Ambulatory Status: Ambulatory ?Pulse (bpm): 74 ?Discharge Destination: Home ?Respiratory Rate (breaths/min): 17 ?Transportation: Private Auto ?Blood Pressure (mmHg): 134/74 ?Accompanied By: daughter ?Schedule Follow-up Appointment: Yes ?Clinical Summary of Care: Patient Declined ?Electronic Signature(s) ?Signed: 03/05/2022 12:54:41 PM By: [name] ?Entered By: Rhae Hammock on 03/04/2022 17:01:04 ?-------------------------------------------------------------------------------- ?Lower Extremity Assessment Details ?Patient Name: ?Date of Service: ?Fritsche, [name] 03/04/2022 12:30 PM ?Medical Record Number: [id number] ?Patient Account Number: [id number] ?Date of Birth/Sex: ?Treating RN: ?August 22, 1925 (88 y.o. [name], [name] ?Primary Care Mckennah Kretchmer: Marda Stalker ?Other Clinician: ?Referring Melondy Blanchard: ?Treating Mahli Glahn/Extender: Linton Ham ?Marda Stalker ?Weeks in Treatment: 13 ?Edema Assessment ?Assessed: [Left: Yes] [Right: No] ?Edema: [Left: N] [Right:  o] ?Calf ?Left: Right: ?Point of Measurement: 30 cm From Medial Instep 27.5 cm ?Ankle ?Left: Right: ?Point of Measurement: 11 cm From Medial Instep 20 cm ?Vascular Assessment ?Pulses: ?Dorsalis Pedis ?Palpable: [Left:Yes] ?Posterior Tibial ?Palpable: [Left:Yes] ?Electronic Signature(s) ?Signed: 03/05/2022 12:54:41 PM By: [name] ?Entered By: Rhae Hammock on 03/04/2022 12:53:46 ?-------------------------------------------------------------------------------- ?Multi Wound Chart Details ?Patient Name: ?Date of Service: ?Keast, [name] 03/04/2022 12:30 PM ?Medical Record Number: [id number] ?Patient Account Number: [id number] ?Date of Birth/Sex: ?Treating RN: ?02/09/1925 (88 y.o. [name], [name] ?Primary Care Essense Bousquet: Marda Stalker ?Other Clinician: ?Referring Valencia Kassa: ?Treating Amamda Curbow/Extender: Linton Ham ?Marda Stalker ?Weeks in Treatment: 13 ?Vital Signs ?Height(in): 66 ?Pulse(bpm): 59 ?Weight(lbs): 176 ?Blood Pressure(mmHg): 134/74 ?Body Mass Index(BMI): 28.4 ?Temperature(??F):  97.7 ?Respiratory Rate(breaths/min): 17 ?Photos: [N/A:N/A] ?Left, Lateral Ankle N/A N/A ?Wound Location: ?Gradually Appeared N/A N/A ?Wounding Event: ?Venous Leg Ulcer N/A N/A ?Primary Etiology: ?Anemia, Congestive Heart Failure, N/A N/A ?Comorbid History: ?Hypertension, Peripheral Venous ?Disease, Osteoarthritis, Received ?Chemotherapy ?11/20/2021 N/A N/A ?Date Acquired: ?63 N/A N/A ?Weeks of Treatment: ?Open N/A N/A ?Wound Status: ?No N/A N/A ?Wound Recurrence: ?2.9x2.2x0.1 N/A N/A ?Measurements L x W x D (cm) ?5.011 N/A N/A ?A (cm?) : ?rea ?0.501 N/A N/A ?Volume (cm?) : ?46.80% N/A N/A ?% Reduction in A rea: ?46.80% N/A N/A ?% Reduction in Volume: ?Full Thickness Without Exposed N/A N/A ?Classification: ?Support Structures ?Medium N/A N/A ?Exudate A mount: ?Serosanguineous N/A N/A ?Exudate Type: ?red, brown N/A N/A ?Exudate Color: ?Distinct, outline attached N/A N/A ?Wound Margin: ?Large (67-100%) N/A  N/A ?Granulation A mount: ?Red N/A N/A ?Granulation Quality: ?Small (1-33%) N/A N/A ?Necrotic A mount: ?Fat Layer (Subcutaneous Tissue): Yes N/A N/A ?Exposed Structures: ?Fascia: No ?Tendon: No ?Muscle: No ?Joint: No ?Bone: No ?Small (1-33%) N/A N/A ?Epithelialization: ?Debridement - Excisional N/A N/A ?Debridement: ?Pre-procedure Verification/Time Out 13:15 N/A N/A ?Taken: ?Lidocaine N/A N/A ?Pain Control: ?Subcutaneous, Slough N/A N/A ?Tissue Debrided: ?Skin/Subcutaneous Tissue N/A N/A ?Level: ?6.38 N/A N/A ?Debridement A (sq cm): ?rea ?Curette N/A N/A ?Instrument: ?Minimum N/A N/A ?Bleeding: ?Pressure N/A N/A ?Hemostasis A chieved: ?0 N/A N/A ?Procedural Pain: ?0 N/A N/A ?Post Procedural Pain: ?Procedure was tolerated well N/A N/A ?Debridement Treatment Response: ?2.9x2.2x0.1 N/A N/A ?Post Debridement Measurements L x ?W x D (cm) ?0.501 N/A N/A ?Post Debridement Volume: (cm?) ?Debridement N/A N/A ?Procedures Performed: ?Treatment Notes ?Electronic Signature(s) ?Signed: 03/04/2022 4:28:39 PM By: Linton Ham MD ?Signed: 03/05/2022 12:54:41 PM By: [name] ?Entered By: Linton Ham on 03/04/2022 13:15:02 ?-------------------------------------------------------------------------------- ?Multi-Disciplinary Care Plan Details ?Patient Name: ?Date of Service: ?Puder, [name] 03/04/2022 12:30 PM ?Medical Record Number: [id number] ?Patient Account Number: [id number] ?Date of Birth/Sex: ?Treating RN: ?[birth year] (88 y.o. [name], [name] ?Primary Care Qusay Villada: Marda Stalker ?Other Clinician: ?Referring Jaevon Paras: ?Treating Daizee Firmin/Extender: Linton Ham ?Marda Stalker ?Weeks in Treatment: 13 ?Active Inactive ?Nutrition ?Nursing Diagnoses: ?Imbalanced nutrition ?Goals: ?Patient/caregiver agrees to and verbalizes understanding of need to use nutritional supplements and/or vitamins as prescribed ?Date Initiated: 11/27/2021 ?Target Resolution Date: 03/20/2022 ?Goal Status:  Active ?Interventions: ?Assess patient nutrition upon admission and as needed per policy ?Provide education on nutrition ?Treatment Activities: ?Education provided on Nutrition : 02/18/2022 ?Notes: ?Wound/Skin Impairment ?Nursing Diagnoses:

## 2022-03-11 ENCOUNTER — Encounter (HOSPITAL_BASED_OUTPATIENT_CLINIC_OR_DEPARTMENT_OTHER): Payer: Medicare PPO | Admitting: Internal Medicine

## 2022-03-11 DIAGNOSIS — I5042 Chronic combined systolic (congestive) and diastolic (congestive) heart failure: Secondary | ICD-10-CM | POA: Diagnosis not present

## 2022-03-11 DIAGNOSIS — L97822 Non-pressure chronic ulcer of other part of left lower leg with fat layer exposed: Secondary | ICD-10-CM | POA: Diagnosis not present

## 2022-03-11 DIAGNOSIS — C50919 Malignant neoplasm of unspecified site of unspecified female breast: Secondary | ICD-10-CM | POA: Diagnosis not present

## 2022-03-11 DIAGNOSIS — G309 Alzheimer's disease, unspecified: Secondary | ICD-10-CM | POA: Diagnosis not present

## 2022-03-11 DIAGNOSIS — I87312 Chronic venous hypertension (idiopathic) with ulcer of left lower extremity: Secondary | ICD-10-CM

## 2022-03-11 DIAGNOSIS — C541 Malignant neoplasm of endometrium: Secondary | ICD-10-CM | POA: Diagnosis not present

## 2022-03-11 NOTE — Progress Notes (Signed)
JAZARIA, JARECKI ([id number]) ?Visit Report for 03/11/2022 ?Chief Complaint Document Details ?Patient Name: Date of Service: ?[name], [name] 03/11/2022 11:00 A M ?Medical Record Number: [id number] ?Patient Account Number: [id number] ?Date of Birth/Sex: Treating RN: ?Nov 30, 1924 (88 y.o. [name], [name] ?Primary Care Provider: Marda Stalker Other Clinician: ?Referring Provider: ?Treating Provider/Extender: Kalman Shan ?Marda Stalker ?Weeks in Treatment: 14 ?Information Obtained from: Patient ?Chief Complaint ?Left lower extremity wound ?Electronic Signature(s) ?Signed: 03/11/2022 3:46:31 PM By: Kalman Shan DO ?Entered By: Kalman Shan on 03/11/2022 14:56:31 ?-------------------------------------------------------------------------------- ?Debridement Details ?Patient Name: Date of Service: ?Srinivasan, [name] 03/11/2022 11:00 A M ?Medical Record Number: [id number] ?Patient Account Number: [id number] ?Date of Birth/Sex: Treating RN: ?1925-06-01 (88 y.o. [name], [name] ?Primary Care Provider: Marda Stalker Other Clinician: ?Referring Provider: ?Treating Provider/Extender: Kalman Shan ?Marda Stalker ?Weeks in Treatment: 14 ?Debridement Performed for Assessment: Wound #26 Left,Lateral Ankle ?Performed By: Physician Kalman Shan, DO ?Debridement Type: Debridement ?Severity of Tissue Pre Debridement: Fat layer exposed ?Level of Consciousness (Pre-procedure): Awake and Alert ?Pre-procedure Verification/Time Out Yes - 12:00 ?Taken: ?Start Time: 12:00 ?Pain Control: Lidocaine ?T Area Debrided (L x W): ?otal 2.8 (cm) x 2 (cm) = 5.6 (cm?) ?Tissue and other material debrided: ?Viable, Non-Viable, Slough, Subcutaneous, Skin: Dermis , Skin: Epidermis, Slough ?Level: Skin/Subcutaneous Tissue ?Debridement Description: Excisional ?Instrument: Curette ?Bleeding: Minimum ?Hemostasis Achieved: Pressure ?End Time: 12:00 ?Procedural Pain: 0 ?Post Procedural Pain: 0 ?Response to Treatment:  Procedure was tolerated well ?Level of Consciousness (Post- Awake and Alert ?procedure): ?Post Debridement Measurements of Total Wound ?Length: (cm) 2.8 ?Width: (cm) 2 ?Depth: (cm) 0.1 ?Volume: (cm?) 0.44 ?Character of Wound/Ulcer Post Debridement: Improved ?Severity of Tissue Post Debridement: Fat layer exposed ?Post Procedure Diagnosis ?Same as Pre-procedure ?Electronic Signature(s) ?Signed: 03/11/2022 3:46:31 PM By: Kalman Shan DO ?Signed: 03/11/2022 5:27:50 PM By: [name] ?Entered By: Rhae Hammock on 03/11/2022 13:22:34 ?-------------------------------------------------------------------------------- ?HPI Details ?Patient Name: Date of Service: ?Arrasmith, [name] 03/11/2022 11:00 A M ?Medical Record Number: [id number] ?Patient Account Number: [id number] ?Date of Birth/Sex: Treating RN: ?01/11/1925 (88 y.o. [name], [name] ?Primary Care Provider: Marda Stalker Other Clinician: ?Referring Provider: ?Treating Provider/Extender: Kalman Shan ?Marda Stalker ?Weeks in Treatment: 14 ?History of Present Illness ?HPI Description: this is a patient we know from several prior wounds on her bilateral lower extremities. She has venous stasis physiology, inflammation and ?hypertension. She is been fully evaluated for venous ablation and was found not to be a candidate. She does not have significant arterial disease. ?02/14/15 the wound itself on her lateral left leg did not look too bad however there was surrounding maceration which was concerning ?02/27/15 the wound itself is small with surrounding circumferential epithelialization there is no surrounding maceration. ?03/06/15 only a very small area remains. I think this is mostly epithelialized however I would be uncomfortable not dressing this this week ?03/13/15 the area is epithelialized. There was a thick surface on this. I took a #15 blade then lightly disrupted it but there does not appear to be any open area I ?did not see anything but  appropriate epithelium ?Readmission: ?08/16/18 on evaluation today patient presents for initial evaluation and our clinic released readmission although she has not been seen since April 2016. ?Nonetheless she is having issues with the ulcers currently on the bilateral lateral malleolus or locations as well as the left lateral lower extremity. These have ?been present for roughly 3 weeks since the patient actually developed significant bilateral lower extremity edema which patient's daughter states was roughly 3 ?times the size of  her legs currently. Subsequently she ended up in the emerging department due to congestive heart failure. They were able to get the swelling ?under control and fortunately she seems to be doing much better at this point in time. She was placed in an [employer] which she sat on for week part ?evaluation today as well. Fortunately the wound areas actually seem to be doing fairly well there is some macerated skin surrounding there are the ?areas/surface of the wound which are gonna require some debridement at this point. No fevers, chills, nausea, or vomiting noted at this time. Patient has a ?history of hypertension, mild peripheral vascular disease, chronic venous stasis, and other than the ulcer seems to be doing fairly well today. No fevers, ?chills, nausea, or vomiting noted at this time. ?08/23/18 on evaluation today patient actually appears to be doing great in regard to her bilateral lower should be ulcers. She's shown signs of improvement even ?just with one week with the [employer]. In general I'm actually very pleased with how things appear at this point. ?09/06/18 on evaluation today patient actually appears to be doing much better her left lower extremity is completely healed. Go to the right lower extremity ?lateral malleolus ulcer this still seems to be showing signs of being open. There does not appear to be evidence of infection which is good news. This did  require ?some sharp debridement however to remove some of the necrotic tissue/eschar on the surface of the wound ?Readmission 11/27/2021 ?Ms. Fabienne Nolasco is a 88 year old female with a past medical history of endometrial and breast cancer, chronic venous insufficiency, dementia and ?essential hypertension that presents to the clinic with a 1 month history of nonhealing ulcer to the left lower extremity. She states that the ulcer opened 1 ?month ago and then healed but has reopened again in the past week. She is currently keeping the area covered. She has a history of wounds to her lower ?extremities. She has compression stockings that she uses daily. She currently denies signs of infection. ?1/13; patient presents for follow-up. She followed up for nurse visit for wrap change. She reports more odor. She denies pain. ?1/20; patient presents for follow-up. She has been using gentamicin ointment with calcium alginate to the wound bed. She reports improvement in odor and ?drainage. She currently denies systemic signs of infection. ?1/27; patient presents for follow-up. She has been using gentamicin ointment with calcium alginate. She received her Keystone antibiotic 2 days ago. She ?brought this in. Home health has also been established. They will come out for the first time next week. She currently denies signs of infection. She has no ?issues or complaints today. ?2/2; patient presents for follow-up. She has been using Keystone antibiotics with [facility] with no issues. She denies signs of infection. She has no ?issues or complaints today. ?2/9; patient presents for follow-up. She has been using Keystone antibiotics with [facility] with no issues. She has tolerated the compression wrap well. ?She denies signs of infection. ?2/16; patient presents for follow-up. She has been using Keystone antibiotics with Hydrofera Blue under compression wrap with no issues. She currently denies ?signs of infection. ?2/23;  patient presents for follow-up. She continues to use Keystone antibiotics with Hydrofera Blue under the compression wrap. She has no issues or ?complaints today. She denies signs of infection. Home health continues to

## 2022-03-11 NOTE — Progress Notes (Signed)
LESLE, FARON ([id number]) ?Visit Report for 03/11/2022 ?Arrival Information Details ?Patient Name: Date of Service: ?[name], [name] 03/11/2022 11:00 A M ?Medical Record Number: [id number] ?Patient Account Number: [id number] ?Date of Birth/Sex: Treating RN: ?02-14-1925 (88 y.o. [name], [name] ?Primary Care Jadalynn Burr: Marda Stalker Other Clinician: ?Referring Aran Menning: ?Treating Chalet Kerwin/Extender: Kalman Shan ?Marda Stalker ?Weeks in Treatment: 14 ?Visit Information History Since Last Visit ?Added or deleted any medications: No ?Patient Arrived: Wheel Chair ?Any new allergies or adverse reactions: No ?Arrival Time: 11:37 ?Had a fall or experienced change in No ?Accompanied By: daughter ?activities of daily living that may affect ?Transfer Assistance: Manual ?risk of falls: ?Patient Identification Verified: Yes ?Signs or symptoms of abuse/neglect since last visito No ?Secondary Verification Process Completed: Yes ?Hospitalized since last visit: No ?Patient Requires Transmission-Based Precautions: No ?Implantable device outside of the clinic excluding No ?Patient Has Alerts: No ?cellular tissue based products placed in the center ?since last visit: ?Has Dressing in Place as Prescribed: Yes ?Has Compression in Place as Prescribed: Yes ?Pain Present Now: No ?Electronic Signature(s) ?Signed: 03/11/2022 5:27:50 PM By: [name] ?Entered By: Rhae Hammock on 03/11/2022 11:38:39 ?-------------------------------------------------------------------------------- ?Lower Extremity Assessment Details ?Patient Name: Date of Service: ?[name], [name] 03/11/2022 11:00 A M ?Medical Record Number: [id number] ?Patient Account Number: [id number] ?Date of Birth/Sex: Treating RN: ?19-Jun-1925 (88 y.o. [name], [name] ?Primary Care Nichole Neyer: Marda Stalker Other Clinician: ?Referring Zoiee Wimmer: ?Treating Yazeed Pryer/Extender: Kalman Shan ?Marda Stalker ?Weeks in Treatment: 14 ?Edema  Assessment ?Assessed: [Left: No] [Right: No] ?Edema: [Left: N] [Right: o] ?Calf ?Left: Right: ?Point of Measurement: 30 cm From Medial Instep 28 cm ?Ankle ?Left: Right: ?Point of Measurement: 11 cm From Medial Instep 19 cm ?Electronic Signature(s) ?Signed: 03/11/2022 5:27:50 PM By: [name] ?Entered By: Rhae Hammock on 03/11/2022 11:48:38 ?-------------------------------------------------------------------------------- ?Multi Wound Chart Details ?Patient Name: ?Date of Service: ?[name], [name] 03/11/2022 11:00 A M ?Medical Record Number: [id number] ?Patient Account Number: [id number] ?Date of Birth/Sex: ?Treating RN: ?May 08, 1925 (88 y.o. [name], [name] ?Primary Care Rosamae Rocque: Marda Stalker ?Other Clinician: ?Referring Kobie Matkins: ?Treating Kitzia Camus/Extender: Kalman Shan ?Marda Stalker ?Weeks in Treatment: 14 ?Vital Signs ?Height(in): 66 ?Pulse(bpm): 74 ?Weight(lbs): 176 ?Blood Pressure(mmHg): 154/74 ?Body Mass Index(BMI): 28.4 ?Temperature(??F): 97.4 ?Respiratory Rate(breaths/min): 17 ?Photos: [N/A:N/A] ?Left, Lateral Ankle N/A N/A ?Wound Location: ?Gradually Appeared N/A N/A ?Wounding Event: ?Venous Leg Ulcer N/A N/A ?Primary Etiology: ?Anemia, Congestive Heart Failure, N/A N/A ?Comorbid History: ?Hypertension, Peripheral Venous ?Disease, Osteoarthritis, Received ?Chemotherapy ?11/20/2021 N/A N/A ?Date Acquired: ?11 N/A N/A ?Weeks of Treatment: ?Open N/A N/A ?Wound Status: ?No N/A N/A ?Wound Recurrence: ?2.8x2x0.1 N/A N/A ?Measurements L x W x D (cm) ?4.398 N/A N/A ?A (cm?) : ?rea ?0.44 N/A N/A ?Volume (cm?) : ?53.30% N/A N/A ?% Reduction in A rea: ?53.30% N/A N/A ?% Reduction in Volume: ?Full Thickness Without Exposed N/A N/A ?Classification: ?Support Structures ?Medium N/A N/A ?Exudate A mount: ?Serosanguineous N/A N/A ?Exudate Type: ?red, brown N/A N/A ?Exudate Color: ?Distinct, outline attached N/A N/A ?Wound Margin: ?Large (67-100%) N/A N/A ?Granulation A mount: ?Red N/A  N/A ?Granulation Quality: ?Small (1-33%) N/A N/A ?Necrotic A mount: ?Fat Layer (Subcutaneous Tissue): Yes N/A N/A ?Exposed Structures: ?Fascia: No ?Tendon: No ?Muscle: No ?Joint: No ?Bone: No ?Small (1-33%) N/A N/A ?Epithelialization: ?Debridement - Excisional N/A N/A ?Debridement: ?Pre-procedure Verification/Time Out 12:00 N/A N/A ?Taken: ?Lidocaine N/A N/A ?Pain Control: ?Subcutaneous, Slough N/A N/A ?Tissue Debrided: ?Skin/Subcutaneous Tissue N/A N/A ?Level: ?5.6 N/A N/A ?Debridement A (sq cm): ?rea ?Curette N/A N/A ?Instrument: ?Minimum N/A N/A ?Bleeding: ?  Pressure N/A N/A ?Hemostasis A chieved: ?0 N/A N/A ?Procedural Pain: ?0 N/A N/A ?Post Procedural Pain: ?Procedure was tolerated well N/A N/A ?Debridement Treatment Response: ?Post Debridement Measurements L x 2.8x2x0.1 N/A N/A ?W x D (cm) ?0.44 N/A N/A ?Post Debridement Volume: (cm?) ?Debridement N/A N/A ?Procedures Performed: ?Treatment Notes ?Electronic Signature(s) ?Signed: 03/11/2022 3:46:31 PM By: Kalman Shan DO ?Signed: 03/11/2022 5:27:50 PM By: [name] ?Entered By: Kalman Shan on 03/11/2022 14:56:20 ?-------------------------------------------------------------------------------- ?Multi-Disciplinary Care Plan Details ?Patient Name: ?Date of Service: ?[name], [name] 03/11/2022 11:00 A M ?Medical Record Number: [id number] ?Patient Account Number: [id number] ?Date of Birth/Sex: ?Treating RN: ?[birth year] (88 y.o. [name], [name] ?Primary Care Iman Reinertsen: Marda Stalker ?Other Clinician: ?Referring Teancum Brule: ?Treating Mylissa Lambe/Extender: Kalman Shan ?Marda Stalker ?Weeks in Treatment: 14 ?Active Inactive ?Nutrition ?Nursing Diagnoses: ?Imbalanced nutrition ?Goals: ?Patient/caregiver agrees to and verbalizes understanding of need to use nutritional supplements and/or vitamins as prescribed ?Date Initiated: 11/27/2021 ?Target Resolution Date: 03/20/2022 ?Goal Status: Active ?Interventions: ?Assess patient nutrition upon  admission and as needed per policy ?Provide education on nutrition ?Treatment Activities: ?Education provided on Nutrition : 03/04/2022 ?Notes: ?Wound/Skin Impairment ?Nursing Diagnoses: ?Impaired tissue integrity ?Goals: ?Patient/caregiver will verbalize understanding of skin care regimen ?Date Initiated: 11/27/2021 ?Target Resolution Date: 03/18/2022 ?Goal Status: Active ?Ulcer/skin breakdown will have a volume reduction of 30% by week 4 ?Date Initiated: 11/27/2021 ?Date Inactivated: 12/31/2021 ?Target Resolution Date: 12/25/2021 ?Unmet Reason: according to ?Goal Status: Unmet ?measurements has not reduced. ?Interventions: ?Assess patient/caregiver ability to obtain necessary supplies ?Assess patient/caregiver ability to perform ulcer/skin care regimen upon admission and as needed ?Assess ulceration(s) every visit ?Provide education on ulcer and skin care ?Treatment Activities: ?Topical wound management initiated : 11/27/2021 ?Notes: ?Electronic Signature(s) ?Signed: 03/11/2022 5:27:50 PM By: [name] ?Entered By: Rhae Hammock on 03/11/2022 11:59:47 ?-------------------------------------------------------------------------------- ?Pain Assessment Details ?Patient Name: ?Date of Service: ?[name], [name] 03/11/2022 11:00 A M ?Medical Record Number: [id number] ?Patient Account Number: [id number] ?Date of Birth/Sex: ?Treating RN: ?09-16-1925 (88 y.o. [name], [name] ?Primary Care Issa Kosmicki: Marda Stalker ?Other Clinician: ?Referring Reika Callanan: ?Treating Jaymian Bogart/Extender: Kalman Shan ?Marda Stalker ?Weeks in Treatment: 14 ?Active Problems ?Location of Pain Severity and Description of Pain ?Patient Has Paino No ?Site Locations ?Pain Management and Medication ?Current Pain Management: ?Electronic Signature(s) ?Signed: 03/11/2022 5:27:50 PM By: [name] ?Entered By: Rhae Hammock on 03/11/2022  11:48:24 ?-------------------------------------------------------------------------------- ?Wound Assessment Details ?Patient Name: ?Date of Service: ?[name], [name] 03/11/2022 11:00 A M ?Medical Record Number: [id number] ?Patient Account Number: [id number] ?Date of Birth/Sex: ?Treating RN: ?02/28/1925 (88 y.o. F) Bree

## 2022-03-17 ENCOUNTER — Other Ambulatory Visit: Payer: Self-pay | Admitting: Cardiology

## 2022-03-17 DIAGNOSIS — I5042 Chronic combined systolic (congestive) and diastolic (congestive) heart failure: Secondary | ICD-10-CM

## 2022-03-19 ENCOUNTER — Encounter (HOSPITAL_BASED_OUTPATIENT_CLINIC_OR_DEPARTMENT_OTHER): Payer: Medicare PPO | Admitting: Internal Medicine

## 2022-03-19 DIAGNOSIS — I872 Venous insufficiency (chronic) (peripheral): Secondary | ICD-10-CM | POA: Diagnosis not present

## 2022-03-19 DIAGNOSIS — I5042 Chronic combined systolic (congestive) and diastolic (congestive) heart failure: Secondary | ICD-10-CM | POA: Diagnosis not present

## 2022-03-19 DIAGNOSIS — L97312 Non-pressure chronic ulcer of right ankle with fat layer exposed: Secondary | ICD-10-CM | POA: Diagnosis not present

## 2022-03-19 DIAGNOSIS — I87312 Chronic venous hypertension (idiopathic) with ulcer of left lower extremity: Secondary | ICD-10-CM | POA: Diagnosis not present

## 2022-03-19 DIAGNOSIS — G309 Alzheimer's disease, unspecified: Secondary | ICD-10-CM | POA: Diagnosis not present

## 2022-03-19 DIAGNOSIS — L97822 Non-pressure chronic ulcer of other part of left lower leg with fat layer exposed: Secondary | ICD-10-CM | POA: Diagnosis not present

## 2022-03-19 DIAGNOSIS — C541 Malignant neoplasm of endometrium: Secondary | ICD-10-CM | POA: Diagnosis not present

## 2022-03-19 DIAGNOSIS — C50919 Malignant neoplasm of unspecified site of unspecified female breast: Secondary | ICD-10-CM | POA: Diagnosis not present

## 2022-03-19 NOTE — Progress Notes (Signed)
[name], [name] ([id number]) ?Visit Report for 03/19/2022 ?Debridement Details ?Patient Name: Date of Service: ?[name], [name] 03/19/2022 1:45 PM ?Medical Record Number: [id number] ?Patient Account Number: [id number] ?Date of Birth/Sex: Treating RN: ?[birth year] (88 y.o. [name], [name] ?Primary Care Provider: Marda Stalker Other Clinician: ?Referring Provider: ?Treating Provider/Extender: Linton Ham ?Marda Stalker ?Weeks in Treatment: 16 ?Debridement Performed for Assessment: Wound #26 Left,Lateral Ankle ?Performed By: Physician Ricard Dillon., MD ?Debridement Type: Debridement ?Severity of Tissue Pre Debridement: Fat layer exposed ?Level of Consciousness (Pre-procedure): Awake and Alert ?Pre-procedure Verification/Time Out Yes - 14:35 ?Taken: ?Start Time: 14:35 ?Pain Control: Lidocaine ?T Area Debrided (L x W): ?otal 2.7 (cm) x 1.8 (cm) = 4.86 (cm?) ?Tissue and other material debrided: Viable, Non-Viable, Slough, Subcutaneous, Slough ?Level: Skin/Subcutaneous Tissue ?Debridement Description: Excisional ?Instrument: Curette ?Bleeding: Minimum ?Hemostasis Achieved: Pressure ?End Time: 14:35 ?Procedural Pain: 0 ?Post Procedural Pain: 0 ?Response to Treatment: Procedure was tolerated well ?Level of Consciousness (Post- Awake and Alert ?procedure): ?Post Debridement Measurements of Total Wound ?Length: (cm) 2.7 ?Width: (cm) 1.8 ?Depth: (cm) 0.1 ?Volume: (cm?) 0.382 ?Character of Wound/Ulcer Post Debridement: Improved ?Severity of Tissue Post Debridement: Fat layer exposed ?Post Procedure Diagnosis ?Same as Pre-procedure ?Electronic Signature(s) ?Signed: 03/19/2022 3:54:37 PM By: [name] ?Signed: 03/19/2022 4:01:11 PM By: Linton Ham MD ?Entered By: Linton Ham on 03/19/2022 14:49:59 ?-------------------------------------------------------------------------------- ?HPI Details ?Patient Name: Date of Service: ?[name], [name] 03/19/2022 1:45 PM ?Medical Record Number:  [id number] ?Patient Account Number: [id number] ?Date of Birth/Sex: Treating RN: ?January 08, 1925 (88 y.o. [name], [name] ?Primary Care Provider: Marda Stalker Other Clinician: ?Referring Provider: ?Treating Provider/Extender: Linton Ham ?Marda Stalker ?Weeks in Treatment: 16 ?History of Present Illness ?HPI Description: this is a patient we know from several prior wounds on her bilateral lower extremities. She has venous stasis physiology, inflammation and ?hypertension. She is been fully evaluated for venous ablation and was found not to be a candidate. She does not have significant arterial disease. ?02/14/15 the wound itself on her lateral left leg did not look too bad however there was surrounding maceration which was concerning ?02/27/15 the wound itself is small with surrounding circumferential epithelialization there is no surrounding maceration. ?03/06/15 only a very small area remains. I think this is mostly epithelialized however I would be uncomfortable not dressing this this week ?03/13/15 the area is epithelialized. There was a thick surface on this. I took a #15 blade then lightly disrupted it but there does not appear to be any open area I ?did not see anything but appropriate epithelium ?Readmission: ?08/16/18 on evaluation today patient presents for initial evaluation and our clinic released readmission although she has not been seen since April 2016. ?Nonetheless she is having issues with the ulcers currently on the bilateral lateral malleolus or locations as well as the left lateral lower extremity. These have ?been present for roughly 3 weeks since the patient actually developed significant bilateral lower extremity edema which patient's daughter states was roughly 3 ?times the size of her legs currently. Subsequently she ended up in the emerging department due to congestive heart failure. They were able to get the swelling ?under control and fortunately she seems to be doing much better at  this point in time. She was placed in an [employer] which she sat on for week part ?evaluation today as well. Fortunately the wound areas actually seem to be doing fairly well there is some macerated skin surrounding there are the ?areas/surface of the wound which are gonna require some debridement  at this point. No fevers, chills, nausea, or vomiting noted at this time. Patient has a ?history of hypertension, mild peripheral vascular disease, chronic venous stasis, and other than the ulcer seems to be doing fairly well today. No fevers, ?chills, nausea, or vomiting noted at this time. ?08/23/18 on evaluation today patient actually appears to be doing great in regard to her bilateral lower should be ulcers. She's shown signs of improvement even ?just with one week with the [employer]. In general I'm actually very pleased with how things appear at this point. ?09/06/18 on evaluation today patient actually appears to be doing much better her left lower extremity is completely healed. Go to the right lower extremity ?lateral malleolus ulcer this still seems to be showing signs of being open. There does not appear to be evidence of infection which is good news. This did require ?some sharp debridement however to remove some of the necrotic tissue/eschar on the surface of the wound ?Readmission 11/27/2021 ?[name] is a 88 year old female with a past medical history of endometrial and breast cancer, chronic venous insufficiency, dementia and ?essential hypertension that presents to the clinic with a 1 month history of nonhealing ulcer to the left lower extremity. She states that the ulcer opened 1 ?month ago and then healed but has reopened again in the past week. She is currently keeping the area covered. She has a history of wounds to her lower ?extremities. She has compression stockings that she uses daily. She currently denies signs of infection. ?1/13; patient presents for follow-up. She followed  up for nurse visit for wrap change. She reports more odor. She denies pain. ?1/20; patient presents for follow-up. She has been using gentamicin ointment with calcium alginate to the wound bed. She reports improvement in odor and ?drainage. She currently denies systemic signs of infection. ?1/27; patient presents for follow-up. She has been using gentamicin ointment with calcium alginate. She received her Keystone antibiotic 2 days ago. She ?brought this in. Home health has also been established. They will come out for the first time next week. She currently denies signs of infection. She has no ?issues or complaints today. ?2/2; patient presents for follow-up. She has been using Keystone antibiotics with [facility] with no issues. She denies signs of infection. She has no ?issues or complaints today. ?2/9; patient presents for follow-up. She has been using Keystone antibiotics with [facility] with no issues. She has tolerated the compression wrap well. ?She denies signs of infection. ?2/16; patient presents for follow-up. She has been using Keystone antibiotics with Hydrofera Blue under compression wrap with no issues. She currently denies ?signs of infection. ?2/23; patient presents for follow-up. She continues to use Keystone antibiotics with Hydrofera Blue under the compression wrap. She has no issues or ?complaints today. She denies signs of infection. Home health continues to come out and change the wrap. ?3/2; patient presents for follow-up. She continues to use [facility] with [facility] under compression with no issues. She denies signs of infection. ?3/16; patient presents for follow-up. She continues to use [facility] with [facility] under compression with no issues. Home health change the dressing ?last week. She denies signs of infection. ?3/23; patient presents for follow-up. She continues to use Keystone antibiotics with Hydrofera Blue under compression with no issues. Home health  continues ?to change the dressings. She denies signs of infection. ?3/30; patient presents for follow-up. She continues to use Keystone antibiotics with Hydrofera Blue under compression with no issues. Home  health cont

## 2022-03-19 NOTE — Progress Notes (Signed)
MALLISA, ALAMEDA ([id number]) ?Visit Report for 03/19/2022 ?Arrival Information Details ?Patient Name: Date of Service: ?Straughter, [name] 03/19/2022 1:45 PM ?Medical Record Number: [id number] ?Patient Account Number: [id number] ?Date of Birth/Sex: Treating RN: ?February 15, 1925 (88 y.o. [name], [name] ?Primary Care Lurlean Kernen: Marda Stalker Other Clinician: ?Referring Jearl Soto: ?Treating Friedrich Harriott/Extender: Linton Ham ?Marda Stalker ?Weeks in Treatment: 16 ?Visit Information History Since Last Visit ?Added or deleted any medications: No ?Patient Arrived: Wheel Chair ?Any new allergies or adverse reactions: No ?Arrival Time: 14:00 ?Had a fall or experienced change in No ?Accompanied By: daughter ?activities of daily living that may affect ?Transfer Assistance: Manual ?risk of falls: ?Patient Identification Verified: Yes ?Signs or symptoms of abuse/neglect since last visito No ?Secondary Verification Process Completed: Yes ?Hospitalized since last visit: No ?Patient Requires Transmission-Based Precautions: No ?Implantable device outside of the clinic excluding No ?Patient Has Alerts: No ?cellular tissue based products placed in the center ?since last visit: ?Has Dressing in Place as Prescribed: Yes ?Has Compression in Place as Prescribed: Yes ?Pain Present Now: No ?Electronic Signature(s) ?Signed: 03/19/2022 3:54:37 PM By: [name] ?Entered By: Rhae Hammock on 03/19/2022 14:00:53 ?-------------------------------------------------------------------------------- ?Compression Therapy Details ?Patient Name: Date of Service: ?Capron, [name] 03/19/2022 1:45 PM ?Medical Record Number: [id number] ?Patient Account Number: [id number] ?Date of Birth/Sex: Treating RN: ?1925/09/07 (88 y.o. [name], [name] ?Primary Care Viveca Beckstrom: Marda Stalker Other Clinician: ?Referring Thanya Cegielski: ?Treating Shaianne Nucci/Extender: Linton Ham ?Marda Stalker ?Weeks in Treatment: 16 ?Compression Therapy Performed  for Wound Assessment: Wound #26 Left,Lateral Ankle ?Performed By: Clinician Rhae Hammock, RN ?Compression Type: Three Layer ?Post Procedure Diagnosis ?Same as Pre-procedure ?Electronic Signature(s) ?Signed: 03/19/2022 3:54:37 PM By: [name] ?Entered By: Rhae Hammock on 03/19/2022 14:18:50 ?-------------------------------------------------------------------------------- ?Encounter Discharge Information Details ?Patient Name: ?Date of Service: ?Paige, [name] 03/19/2022 1:45 PM ?Medical Record Number: [id number] ?Patient Account Number: [id number] ?Date of Birth/Sex: ?Treating RN: ?1925/10/07 (88 y.o. [name], [name] ?Primary Care Jiovanna Frei: Marda Stalker ?Other Clinician: ?Referring Lovely Kerins: ?Treating Lorann Tani/Extender: Linton Ham ?Marda Stalker ?Weeks in Treatment: 16 ?Encounter Discharge Information Items ?Discharge Condition: Stable ?Ambulatory Status: Wheelchair ?Discharge Destination: Home ?Transportation: Private Auto ?Accompanied By: daughter ?Schedule Follow-up Appointment: Yes ?Clinical Summary of Care: Patient Declined ?Electronic Signature(s) ?Signed: 03/19/2022 3:54:37 PM By: [name] ?Entered By: Rhae Hammock on 03/19/2022 14:22:52 ?-------------------------------------------------------------------------------- ?Lower Extremity Assessment Details ?Patient Name: ?Date of Service: ?Burack, [name] 03/19/2022 1:45 PM ?Medical Record Number: [id number] ?Patient Account Number: [id number] ?Date of Birth/Sex: ?Treating RN: ?Jun 27, 1925 (88 y.o. [name], [name] ?Primary Care Hudson Majkowski: Marda Stalker ?Other Clinician: ?Referring Neilan Rizzo: ?Treating Addam Goeller/Extender: Linton Ham ?Marda Stalker ?Weeks in Treatment: 16 ?Edema Assessment ?Assessed: [Left: Yes] [Right: No] ?Edema: [Left: N] [Right: o] ?Calf ?Left: Right: ?Point of Measurement: 30 cm From Medial Instep 28 cm ?Ankle ?Left: Right: ?Point of Measurement: 11 cm From Medial Instep 19  cm ?Vascular Assessment ?Pulses: ?Dorsalis Pedis ?Palpable: [Left:Yes] ?Posterior Tibial ?Palpable: [Left:Yes] ?Electronic Signature(s) ?Signed: 03/19/2022 3:54:37 PM By: [name] ?Entered By: Rhae Hammock on 03/19/2022 14:12:14 ?-------------------------------------------------------------------------------- ?Multi Wound Chart Details ?Patient Name: ?Date of Service: ?Manning, [name] 03/19/2022 1:45 PM ?Medical Record Number: [id number] ?Patient Account Number: [id number] ?Date of Birth/Sex: ?Treating RN: ?15-May-1925 (88 y.o. [name], [name] ?Primary Care Sabra Sessler: Marda Stalker ?Other Clinician: ?Referring Lyndsey Demos: ?Treating Graeme Menees/Extender: Linton Ham ?Marda Stalker ?Weeks in Treatment: 16 ?Vital Signs ?Height(in): 66 ?Pulse(bpm): 74 ?Weight(lbs): 176 ?Blood Pressure(mmHg): 139/74 ?Body Mass Index(BMI): 28.4 ?Temperature(??F): 98.7 ?Respiratory Rate(breaths/min): 17 ?Photos: [N/A:N/A] ?Left, Lateral Ankle N/A N/A ?Wound Location: ?Gradually Appeared N/A N/A ?  Wounding Event: ?Venous Leg Ulcer N/A N/A ?Primary Etiology: ?Anemia, Congestive Heart Failure, N/A N/A ?Comorbid History: ?Hypertension, Peripheral Venous ?Disease, Osteoarthritis, Received ?Chemotherapy ?11/20/2021 N/A N/A ?Date Acquired: ?69 N/A N/A ?Weeks of Treatment: ?Open N/A N/A ?Wound Status: ?No N/A N/A ?Wound Recurrence: ?2.7x1.8x0.1 N/A N/A ?Measurements L x W x D (cm) ?3.817 N/A N/A ?A (cm?) : ?rea ?0.382 N/A N/A ?Volume (cm?) : ?59.50% N/A N/A ?% Reduction in A rea: ?59.40% N/A N/A ?% Reduction in Volume: ?Full Thickness Without Exposed N/A N/A ?Classification: ?Support Structures ?Medium N/A N/A ?Exudate A mount: ?Serosanguineous N/A N/A ?Exudate Type: ?red, brown N/A N/A ?Exudate Color: ?Distinct, outline attached N/A N/A ?Wound Margin: ?Large (67-100%) N/A N/A ?Granulation A mount: ?Red N/A N/A ?Granulation Quality: ?Small (1-33%) N/A N/A ?Necrotic A mount: ?Fat Layer (Subcutaneous Tissue): Yes N/A  N/A ?Exposed Structures: ?Fascia: No ?Tendon: No ?Muscle: No ?Joint: No ?Bone: No ?Small (1-33%) N/A N/A ?Epithelialization: ?Debridement - Excisional N/A N/A ?Debridement: ?Pre-procedure Verification/Time Out 14:35 N/A N/A ?Taken: ?Lidocaine N/A N/A ?Pain Control: ?Subcutaneous, Slough N/A N/A ?Tissue Debrided: ?Skin/Subcutaneous Tissue N/A N/A ?Level: ?4.86 N/A N/A ?Debridement A (sq cm): ?rea ?Curette N/A N/A ?Instrument: ?Minimum N/A N/A ?Bleeding: ?Pressure N/A N/A ?Hemostasis A chieved: ?0 N/A N/A ?Procedural Pain: ?0 N/A N/A ?Post Procedural Pain: ?Procedure was tolerated well N/A N/A ?Debridement Treatment Response: ?2.7x1.8x0.1 N/A N/A ?Post Debridement Measurements L x ?W x D (cm) ?0.382 N/A N/A ?Post Debridement Volume: (cm?) ?Compression Therapy N/A N/A ?Procedures Performed: ?Debridement ?Treatment Notes ?Wound #26 (Ankle) Wound Laterality: Left, Lateral ?Cleanser ?Soap and Water ?Discharge Instruction: May shower and wash wound with dial antibacterial soap and water prior to dressing change. ?Peri-Wound Care ?Sween Lotion (Moisturizing lotion) ?Discharge Instruction: Apply moisturizing lotion as directed ?Topical ?Keystone topical Antibiotics ?Discharge Instruction: Mix medication according to pharmacy instruction apply to wound bed. ?Primary Dressing ?Hydrofera Blue Classic Foam, 4x4 in ?Discharge Instruction: Moisten with saline apply over the [facility] medication. ?Secondary Dressing ?Zetuvit Plus 4x8 in ?Discharge Instruction: Apply over primary dressing as directed. ?Secured With ?Compression Wrap ?ThreePress (3 layer compression wrap) ?Discharge Instruction: Apply three layer compression as directed. ?Compression Stockings ?Add-Ons ?Electronic Signature(s) ?Signed: 03/19/2022 3:54:37 PM By: [name] ?Signed: 03/19/2022 4:01:11 PM By: Linton Ham MD ?Entered By: Linton Ham on 03/19/2022  14:49:41 ?-------------------------------------------------------------------------------- ?Multi-Disciplinary Care Plan Details ?Patient Name: ?Date of Service: ?Grewell, [name] 03/19/2022 1:45 PM ?Medical Record Number: [id number] ?Patient Account Number: [id number] ?Date of Birth/Sex: ?Treating RN:

## 2022-03-26 ENCOUNTER — Encounter (HOSPITAL_BASED_OUTPATIENT_CLINIC_OR_DEPARTMENT_OTHER): Payer: Medicare PPO | Attending: Internal Medicine | Admitting: Internal Medicine

## 2022-03-26 ENCOUNTER — Ambulatory Visit: Payer: Medicare PPO | Admitting: Podiatry

## 2022-03-26 DIAGNOSIS — G309 Alzheimer's disease, unspecified: Secondary | ICD-10-CM | POA: Diagnosis not present

## 2022-03-26 DIAGNOSIS — L97822 Non-pressure chronic ulcer of other part of left lower leg with fat layer exposed: Secondary | ICD-10-CM | POA: Diagnosis not present

## 2022-03-26 DIAGNOSIS — C541 Malignant neoplasm of endometrium: Secondary | ICD-10-CM | POA: Diagnosis not present

## 2022-03-26 DIAGNOSIS — I5042 Chronic combined systolic (congestive) and diastolic (congestive) heart failure: Secondary | ICD-10-CM | POA: Insufficient documentation

## 2022-03-26 DIAGNOSIS — C50919 Malignant neoplasm of unspecified site of unspecified female breast: Secondary | ICD-10-CM | POA: Diagnosis not present

## 2022-03-26 DIAGNOSIS — I87312 Chronic venous hypertension (idiopathic) with ulcer of left lower extremity: Secondary | ICD-10-CM | POA: Diagnosis not present

## 2022-03-26 NOTE — Progress Notes (Signed)
SONI, KEGEL ([id number]) ?Visit Report for 03/26/2022 ?Arrival Information Details ?Patient Name: Date of Service: ?[name], [name] 03/26/2022 10:00 A M ?Medical Record Number: [id number] ?Patient Account Number: [id number] ?Date of Birth/Sex: Treating RN: ?May 28, 1925 (88 y.o. [name], [name] ?Primary Care [name]: [name] Other Clinician: ?Referring [name]: ?Treating [name]/Extender: [name] ?[name] ?Weeks in Treatment: 17 ?Visit Information History Since Last Visit ?All ordered tests and consults were completed: No ?Patient Arrived: Wheel Chair ?Added or deleted any medications: No ?Arrival Time: 09:59 ?Any new allergies or adverse reactions: No ?Accompanied By: self ?Had a fall or experienced change in No ?Transfer Assistance: Stretcher ?activities of daily living that may affect ?Patient Identification Verified: Yes ?risk of falls: ?Secondary Verification Process Completed: Yes ?Signs or symptoms of abuse/neglect since last visito No ?Patient Requires Transmission-Based Precautions: No ?Hospitalized since last visit: No ?Patient Has Alerts: No ?Implantable device outside of the clinic excluding No ?cellular tissue based products placed in the center ?since last visit: ?Has Dressing in Place as Prescribed: Yes ?Has Compression in Place as Prescribed: Yes ?Pain Present Now: No ?Electronic Signature(s) ?Signed: 03/26/2022 12:37:27 PM By: [name] Hammock RN ?Entered By: [name] on 03/26/2022 10:00:26 ?-------------------------------------------------------------------------------- ?Compression Therapy Details ?Patient Name: Date of Service: ?[name], [name] 03/26/2022 10:00 A M ?Medical Record Number: [id number] ?Patient Account Number: [id number] ?Date of Birth/Sex: Treating RN: ?Jan 11, 1925 (88 y.o. [name], [name] ?Primary Care Raunak Antuna: [name] Other Clinician: ?Referring [name]: ?Treating [name]/Extender: [name] ?[name] ?Weeks in Treatment: 17 ?Compression Therapy Performed for Wound Assessment: Wound #26 Left,Lateral Ankle ?Performed By: Clinician [name] Hammock, RN ?Compression Type: Three Layer ?Post Procedure Diagnosis ?Same as Pre-procedure ?Electronic Signature(s) ?Signed: 03/26/2022 12:37:27 PM By: [name] Hammock RN ?Entered By: [name] on 03/26/2022 10:20:46 ?-------------------------------------------------------------------------------- ?Encounter Discharge Information Details ?Patient Name: ?Date of Service: ?[name], [name] 03/26/2022 10:00 A M ?Medical Record Number: [id number] ?Patient Account Number: [id number] ?Date of Birth/Sex: ?Treating RN: ?[birth year] (88 y.o. [name], [name] ?Primary Care Ayomikun Starling: [name] ?Other Clinician: ?Referring Arham Symmonds: ?Treating [name]/Extender: [name] ?[name] ?Weeks in Treatment: 17 ?Encounter Discharge Information Items ?Discharge Condition: Stable ?Ambulatory Status: Wheelchair ?Discharge Destination: Home ?Transportation: Private Auto ?Accompanied By: daughter ?Schedule Follow-up Appointment: Yes ?Clinical Summary of Care: Patient Declined ?Electronic Signature(s) ?Signed: 03/26/2022 12:37:27 PM By: [name] Hammock RN ?Entered By: [name] on 03/26/2022 10:23:03 ?-------------------------------------------------------------------------------- ?Lower Extremity Assessment Details ?Patient Name: ?Date of Service: ?[name], [name] 03/26/2022 10:00 A M ?Medical Record Number: [id number] ?Patient Account Number: [id number] ?Date of Birth/Sex: ?Treating RN: ?1925/03/16 (88 y.o. [name], [name] ?Primary Care Willamae Demby: [name] ?Other Clinician: ?Referring [name]: ?Treating [name]/Extender: [name] ?[name] ?Weeks in Treatment: 17 ?Edema Assessment ?Assessed: [Left: Yes] [Right: No] ?Edema: [Left: N] [Right: o] ?Calf ?Left: Right: ?Point of Measurement: 30 cm From Medial Instep 28  cm ?Ankle ?Left: Right: ?Point of Measurement: 11 cm From Medial Instep 19 cm ?Vascular Assessment ?Pulses: ?Dorsalis Pedis ?Palpable: [Left:Yes] ?Posterior Tibial ?Palpable: [Left:Yes] ?Electronic Signature(s) ?Signed: 03/26/2022 12:37:27 PM By: [name] Hammock RN ?Entered By: [name] on 03/26/2022 10:01:27 ?-------------------------------------------------------------------------------- ?Multi Wound Chart Details ?Patient Name: ?Date of Service: ?[name], [name] 03/26/2022 10:00 A M ?Medical Record Number: [id number] ?Patient Account Number: [id number] ?Date of Birth/Sex: ?Treating RN: ?1925-06-25 (88 y.o. [name], [name] ?Primary Care [name]: [name] ?Other Clinician: ?Referring Zeric Baranowski: ?Treating Sierah Lacewell/Extender: [name] ?[name] ?Weeks in Treatment: 17 ?Vital Signs ?Height(in): 66 ?Pulse(bpm): 74 ?Weight(lbs): 176 ?Blood Pressure(mmHg): 120/70 ?Body Mass Index(BMI): 28.4 ?Temperature(??F): 98.7 ?Respiratory Rate(breaths/min): 17 ?  Photos: [N/A:N/A] ?Left, Lateral Ankle N/A N/A ?Wound Location: ?Gradually Appeared N/A N/A ?Wounding Event: ?Venous Leg Ulcer N/A N/A ?Primary Etiology: ?Anemia, Congestive Heart Failure, N/A N/A ?Comorbid History: ?Hypertension, Peripheral Venous ?Disease, Osteoarthritis, Received ?Chemotherapy ?11/20/2021 N/A N/A ?Date Acquired: ?29 N/A N/A ?Weeks of Treatment: ?Open N/A N/A ?Wound Status: ?No N/A N/A ?Wound Recurrence: ?2.5x1.8x0.2 N/A N/A ?Measurements L x W x D (cm) ?3.534 N/A N/A ?A (cm?) : ?rea ?0.707 N/A N/A ?Volume (cm?) : ?62.50% N/A N/A ?% Reduction in Area: ?24.90% N/A N/A ?% Reduction in Volume: ?Full Thickness Without Exposed N/A N/A ?Classification: ?Support Structures ?Medium N/A N/A ?Exudate Amount: ?Serosanguineous N/A N/A ?Exudate Type: ?red, brown N/A N/A ?Exudate Color: ?Distinct, outline attached N/A N/A ?Wound Margin: ?Large (67-100%) N/A N/A ?Granulation Amount: ?Red N/A N/A ?Granulation Quality: ?Small (1-33%) N/A  N/A ?Necrotic Amount: ?Fat Layer (Subcutaneous Tissue): Yes N/A N/A ?Exposed Structures: ?Fascia: No ?Tendon: No ?Muscle: No ?Joint: No ?Bone: No ?Small (1-33%) N/A N/A ?Epithelialization: ?Compression Therapy N/A N/A ?Procedures Performed: ?Treatment Notes ?Wound #26 (Ankle) Wound Laterality: Left, Lateral ?Cleanser ?Soap and Water ?Discharge Instruction: May shower and wash wound with dial antibacterial soap and water prior to dressing change. ?Peri-Wound Care ?Sween Lotion (Moisturizing lotion) ?Discharge Instruction: Apply moisturizing lotion as directed ?Topical ?Keystone topical Antibiotics ?Discharge Instruction: Mix medication according to pharmacy instruction apply to wound bed. ?Primary Dressing ?Hydrofera Blue Classic Foam, 4x4 in ?Discharge Instruction: Moisten with saline apply over the [facility] medication. ?Secondary Dressing ?Zetuvit Plus 4x8 in ?Discharge Instruction: Apply over primary dressing as directed. ?Secured With ?Compression Wrap ?ThreePress (3 layer compression wrap) ?Discharge Instruction: Apply three layer compression as directed. ?Compression Stockings ?Add-Ons ?Electronic Signature(s) ?Signed: 03/26/2022 11:26:03 AM By: [name] Shan DO ?Signed: 03/26/2022 12:37:27 PM By: [name] Hammock RN ?Entered By: [name] on 03/26/2022 10:29:12 ?-------------------------------------------------------------------------------- ?Multi-Disciplinary Care Plan Details ?Patient Name: ?Date of Service: ?[name], [name] 03/26/2022 10:00 A M ?Medical Record Number: [id number] ?Patient Account Number: [id number] ?Date of Birth/Sex: ?Treating RN: ?1925-11-12 (88 y.o. [name], [name] ?Primary Care Jaiceon Collister: [name] ?Other Clinician: ?Referring Pranav Lince: ?Treating Corin Tilly/Extender: [name] ?[name] ?Weeks in Treatment: 17 ?Active Inactive ?Nutrition ?Nursing Diagnoses: ?Imbalanced nutrition ?Goals: ?Patient/caregiver agrees to and verbalizes understanding of need to  use nutritional supplements and/or vitamins as prescribed ?Date Initiated: 11/27/2021 ?Target Resolution Date: 04/24/2022 ?Goal Status: Active ?Interventions: ?Assess patient nutrition upon admission and as needed per policy

## 2022-03-26 NOTE — Progress Notes (Signed)
TRESSY, KUNZMAN ([id number]) ?Visit Report for 03/26/2022 ?Chief Complaint Document Details ?Patient Name: Date of Service: ?Croghan, [name] 03/26/2022 10:00 A M ?Medical Record Number: [id number] ?Patient Account Number: [id number] ?Date of Birth/Sex: Treating RN: ?March 19, 1925 (88 y.o. [name], [name] ?Primary Care Provider: Marda Stalker Other Clinician: ?Referring Provider: ?Treating Provider/Extender: Kalman Shan ?Marda Stalker ?Weeks in Treatment: 17 ?Information Obtained from: Patient ?Chief Complaint ?Left lower extremity wound ?Electronic Signature(s) ?Signed: 03/26/2022 11:26:03 AM By: Kalman Shan DO ?Entered By: Kalman Shan on 03/26/2022 10:29:23 ?-------------------------------------------------------------------------------- ?HPI Details ?Patient Name: Date of Service: ?Sokolow, [name] 03/26/2022 10:00 A M ?Medical Record Number: [id number] ?Patient Account Number: [id number] ?Date of Birth/Sex: Treating RN: ?Mar 01, 1925 (88 y.o. [name], [name] ?Primary Care Provider: Marda Stalker Other Clinician: ?Referring Provider: ?Treating Provider/Extender: Kalman Shan ?Marda Stalker ?Weeks in Treatment: 17 ?History of Present Illness ?HPI Description: this is a patient we know from several prior wounds on her bilateral lower extremities. She has venous stasis physiology, inflammation and ?hypertension. She is been fully evaluated for venous ablation and was found not to be a candidate. She does not have significant arterial disease. ?02/14/15 the wound itself on her lateral left leg did not look too bad however there was surrounding maceration which was concerning ?02/27/15 the wound itself is small with surrounding circumferential epithelialization there is no surrounding maceration. ?03/06/15 only a very small area remains. I think this is mostly epithelialized however I would be uncomfortable not dressing this this week ?03/13/15 the area is epithelialized. There was a thick  surface on this. I took a #15 blade then lightly disrupted it but there does not appear to be any open area I ?did not see anything but appropriate epithelium ?Readmission: ?08/16/18 on evaluation today patient presents for initial evaluation and our clinic released readmission although she has not been seen since April 2016. ?Nonetheless she is having issues with the ulcers currently on the bilateral lateral malleolus or locations as well as the left lateral lower extremity. These have ?been present for roughly 3 weeks since the patient actually developed significant bilateral lower extremity edema which patient's daughter states was roughly 3 ?times the size of her legs currently. Subsequently she ended up in the emerging department due to congestive heart failure. They were able to get the swelling ?under control and fortunately she seems to be doing much better at this point in time. She was placed in an [employer] which she sat on for week part ?evaluation today as well. Fortunately the wound areas actually seem to be doing fairly well there is some macerated skin surrounding there are the ?areas/surface of the wound which are gonna require some debridement at this point. No fevers, chills, nausea, or vomiting noted at this time. Patient has a ?history of hypertension, mild peripheral vascular disease, chronic venous stasis, and other than the ulcer seems to be doing fairly well today. No fevers, ?chills, nausea, or vomiting noted at this time. ?08/23/18 on evaluation today patient actually appears to be doing great in regard to her bilateral lower should be ulcers. She's shown signs of improvement even ?just with one week with the [employer]. In general I'm actually very pleased with how things appear at this point. ?09/06/18 on evaluation today patient actually appears to be doing much better her left lower extremity is completely healed. Go to the right lower extremity ?lateral malleolus ulcer this  still seems to be showing signs of being open. There does not appear to be evidence  of infection which is good news. This did require ?some sharp debridement however to remove some of the necrotic tissue/eschar on the surface of the wound ?Readmission 11/27/2021 ?Ms. Nyilah Kight is a 88 year old female with a past medical history of endometrial and breast cancer, chronic venous insufficiency, dementia and ?essential hypertension that presents to the clinic with a 1 month history of nonhealing ulcer to the left lower extremity. She states that the ulcer opened 1 ?month ago and then healed but has reopened again in the past week. She is currently keeping the area covered. She has a history of wounds to her lower ?extremities. She has compression stockings that she uses daily. She currently denies signs of infection. ?1/13; patient presents for follow-up. She followed up for nurse visit for wrap change. She reports more odor. She denies pain. ?1/20; patient presents for follow-up. She has been using gentamicin ointment with calcium alginate to the wound bed. She reports improvement in odor and ?drainage. She currently denies systemic signs of infection. ?1/27; patient presents for follow-up. She has been using gentamicin ointment with calcium alginate. She received her Keystone antibiotic 2 days ago. She ?brought this in. Home health has also been established. They will come out for the first time next week. She currently denies signs of infection. She has no ?issues or complaints today. ?2/2; patient presents for follow-up. She has been using Keystone antibiotics with [facility] with no issues. She denies signs of infection. She has no ?issues or complaints today. ?2/9; patient presents for follow-up. She has been using Keystone antibiotics with [facility] with no issues. She has tolerated the compression wrap well. ?She denies signs of infection. ?2/16; patient presents for follow-up. She has been using  Keystone antibiotics with Hydrofera Blue under compression wrap with no issues. She currently denies ?signs of infection. ?2/23; patient presents for follow-up. She continues to use Keystone antibiotics with Hydrofera Blue under the compression wrap. She has no issues or ?complaints today. She denies signs of infection. Home health continues to come out and change the wrap. ?3/2; patient presents for follow-up. She continues to use [facility] with [facility] under compression with no issues. She denies signs of infection. ?3/16; patient presents for follow-up. She continues to use [facility] with [facility] under compression with no issues. Home health change the dressing ?last week. She denies signs of infection. ?3/23; patient presents for follow-up. She continues to use Keystone antibiotics with Hydrofera Blue under compression with no issues. Home health continues ?to change the dressings. She denies signs of infection. ?3/30; patient presents for follow-up. She continues to use Keystone antibiotics with Hydrofera Blue under compression with no issues. Home health continues ?to change the dressings. ?4/13 2-week follow-up. Keystone and Hydrofera Blue wound is roughly 2.3 cm in diameter smaller. No other issues ?4/20; patient presents for follow-up. She continues to use Keystone antibiotic with [facility] with no issues. ?4/28; patient with a difficult wound on her right medial lower leg. Using [facility] minimal improvement per our intake nurse we are using ?compression. She has home health changing the dressing ?5/5; patient presents for follow-up. We have been using Keystone antibiotics and Hydrofera Blue under compression therapy. She has no issues or complaints ?today. We discussed potentially using an advanced tissue product. She states she has had this done in the past and would like to proceed to see what her ?insurance would cover. ?Electronic Signature(s) ?Signed: 03/26/2022  11:26:03 AM By: Kalman Shan DO ?Entered By:  Kalman Shan on 03/26/2022 10:31:36 ?-------------------------------------------------------------------------------- ?Physical Exam Details ?Patient Name:

## 2022-03-29 DIAGNOSIS — Z961 Presence of intraocular lens: Secondary | ICD-10-CM | POA: Diagnosis not present

## 2022-03-29 DIAGNOSIS — H35363 Drusen (degenerative) of macula, bilateral: Secondary | ICD-10-CM | POA: Diagnosis not present

## 2022-03-30 ENCOUNTER — Ambulatory Visit: Payer: Medicare PPO | Admitting: Podiatry

## 2022-03-30 ENCOUNTER — Encounter: Payer: Self-pay | Admitting: Podiatry

## 2022-03-30 DIAGNOSIS — I739 Peripheral vascular disease, unspecified: Secondary | ICD-10-CM

## 2022-03-30 DIAGNOSIS — M79674 Pain in right toe(s): Secondary | ICD-10-CM | POA: Diagnosis not present

## 2022-03-30 DIAGNOSIS — M79675 Pain in left toe(s): Secondary | ICD-10-CM | POA: Diagnosis not present

## 2022-03-30 DIAGNOSIS — B351 Tinea unguium: Secondary | ICD-10-CM

## 2022-03-30 DIAGNOSIS — L84 Corns and callosities: Secondary | ICD-10-CM

## 2022-04-02 ENCOUNTER — Encounter (HOSPITAL_BASED_OUTPATIENT_CLINIC_OR_DEPARTMENT_OTHER): Payer: Medicare PPO | Admitting: Internal Medicine

## 2022-04-02 DIAGNOSIS — L97822 Non-pressure chronic ulcer of other part of left lower leg with fat layer exposed: Secondary | ICD-10-CM | POA: Diagnosis not present

## 2022-04-02 DIAGNOSIS — C541 Malignant neoplasm of endometrium: Secondary | ICD-10-CM | POA: Diagnosis not present

## 2022-04-02 DIAGNOSIS — G309 Alzheimer's disease, unspecified: Secondary | ICD-10-CM | POA: Diagnosis not present

## 2022-04-02 DIAGNOSIS — C50919 Malignant neoplasm of unspecified site of unspecified female breast: Secondary | ICD-10-CM | POA: Diagnosis not present

## 2022-04-02 DIAGNOSIS — I5042 Chronic combined systolic (congestive) and diastolic (congestive) heart failure: Secondary | ICD-10-CM | POA: Diagnosis not present

## 2022-04-02 DIAGNOSIS — I87312 Chronic venous hypertension (idiopathic) with ulcer of left lower extremity: Secondary | ICD-10-CM | POA: Diagnosis not present

## 2022-04-06 NOTE — Progress Notes (Signed)
VIENNA, FOLDEN ([id number]) ?Visit Report for 04/02/2022 ?Chief Complaint Document Details ?Patient Name: Date of Service: ?[name], [name] 04/02/2022 10:45 A M ?Medical Record Number: [id number] ?Patient Account Number: [id number] ?Date of Birth/Sex: Treating RN: ?Jan 31, 1925 (88 y.o. [name], [name] ?Primary Care Provider: Marda Stalker Other Clinician: ?Referring Provider: ?Treating Provider/Extender: Kalman Shan ?Marda Stalker ?Weeks in Treatment: 18 ?Information Obtained from: Patient ?Chief Complaint ?Left lower extremity wound ?Electronic Signature(s) ?Signed: 04/05/2022 2:13:55 PM By: Kalman Shan DO ?Entered By: Kalman Shan on 04/05/2022 13:02:13 ?-------------------------------------------------------------------------------- ?Debridement Details ?Patient Name: Date of Service: ?[name], [name] 04/02/2022 10:45 A M ?Medical Record Number: [id number] ?Patient Account Number: [id number] ?Date of Birth/Sex: Treating RN: ?Feb 08, 1925 (88 y.o. [name], [name] ?Primary Care Provider: Marda Stalker Other Clinician: ?Referring Provider: ?Treating Provider/Extender: Kalman Shan ?Marda Stalker ?Weeks in Treatment: 18 ?Debridement Performed for Assessment: Wound #26 Left,Lateral Ankle ?Performed By: Physician Kalman Shan, DO ?Debridement Type: Debridement ?Severity of Tissue Pre Debridement: Fat layer exposed ?Level of Consciousness (Pre-procedure): Awake and Alert ?Pre-procedure Verification/Time Out Yes - 12:35 ?Taken: ?Start Time: 12:35 ?Pain Control: Lidocaine ?T Area Debrided (L x W): ?otal 2.4 (cm) x 1.9 (cm) = 4.56 (cm?) ?Tissue and other material debrided: Viable, Non-Viable, Slough, Subcutaneous, Slough ?Level: Skin/Subcutaneous Tissue ?Debridement Description: Excisional ?Instrument: Curette ?Bleeding: Minimum ?Hemostasis Achieved: Pressure ?End Time: 12:35 ?Procedural Pain: 0 ?Post Procedural Pain: 0 ?Response to Treatment: Procedure was tolerated well ?Level  of Consciousness (Post- Awake and Alert ?procedure): ?Post Debridement Measurements of Total Wound ?Length: (cm) 2.4 ?Width: (cm) 1.9 ?Depth: (cm) 0.3 ?Volume: (cm?) 1.074 ?Character of Wound/Ulcer Post Debridement: Improved ?Severity of Tissue Post Debridement: Fat layer exposed ?Post Procedure Diagnosis ?Same as Pre-procedure ?Electronic Signature(s) ?Signed: 04/02/2022 2:51:54 PM By: [name] ?Signed: 04/05/2022 2:13:55 PM By: Kalman Shan DO ?Entered By: Rhae Hammock on 04/02/2022 14:18:05 ?-------------------------------------------------------------------------------- ?HPI Details ?Patient Name: Date of Service: ?[name], [name] 04/02/2022 10:45 A M ?Medical Record Number: [id number] ?Patient Account Number: [id number] ?Date of Birth/Sex: Treating RN: ?11/11/1925 (88 y.o. [name], [name] ?Primary Care Provider: Marda Stalker Other Clinician: ?Referring Provider: ?Treating Provider/Extender: Kalman Shan ?Marda Stalker ?Weeks in Treatment: 18 ?History of Present Illness ?HPI Description: this is a patient we know from several prior wounds on her bilateral lower extremities. She has venous stasis physiology, inflammation and ?hypertension. She is been fully evaluated for venous ablation and was found not to be a candidate. She does not have significant arterial disease. ?02/14/15 the wound itself on her lateral left leg did not look too bad however there was surrounding maceration which was concerning ?02/27/15 the wound itself is small with surrounding circumferential epithelialization there is no surrounding maceration. ?03/06/15 only a very small area remains. I think this is mostly epithelialized however I would be uncomfortable not dressing this this week ?03/13/15 the area is epithelialized. There was a thick surface on this. I took a #15 blade then lightly disrupted it but there does not appear to be any open area I ?did not see anything but appropriate  epithelium ?Readmission: ?08/16/18 on evaluation today patient presents for initial evaluation and our clinic released readmission although she has not been seen since April 2016. ?Nonetheless she is having issues with the ulcers currently on the bilateral lateral malleolus or locations as well as the left lateral lower extremity. These have ?been present for roughly 3 weeks since the patient actually developed significant bilateral lower extremity edema which patient's daughter states was roughly 3 ?times the size of her legs currently. Subsequently she  ended up in the emerging department due to congestive heart failure. They were able to get the swelling ?under control and fortunately she seems to be doing much better at this point in time. She was placed in an [employer] which she sat on for week part ?evaluation today as well. Fortunately the wound areas actually seem to be doing fairly well there is some macerated skin surrounding there are the ?areas/surface of the wound which are gonna require some debridement at this point. No fevers, chills, nausea, or vomiting noted at this time. Patient has a ?history of hypertension, mild peripheral vascular disease, chronic venous stasis, and other than the ulcer seems to be doing fairly well today. No fevers, ?chills, nausea, or vomiting noted at this time. ?08/23/18 on evaluation today patient actually appears to be doing great in regard to her bilateral lower should be ulcers. She's shown signs of improvement even ?just with one week with the [employer]. In general I'm actually very pleased with how things appear at this point. ?09/06/18 on evaluation today patient actually appears to be doing much better her left lower extremity is completely healed. Go to the right lower extremity ?lateral malleolus ulcer this still seems to be showing signs of being open. There does not appear to be evidence of infection which is good news. This did require ?some sharp  debridement however to remove some of the necrotic tissue/eschar on the surface of the wound ?Readmission 11/27/2021 ?[name] is a 88 year old female with a past medical history of endometrial and breast cancer, chronic venous insufficiency, dementia and ?essential hypertension that presents to the clinic with a 1 month history of nonhealing ulcer to the left lower extremity. She states that the ulcer opened 1 ?month ago and then healed but has reopened again in the past week. She is currently keeping the area covered. She has a history of wounds to her lower ?extremities. She has compression stockings that she uses daily. She currently denies signs of infection. ?1/13; patient presents for follow-up. She followed up for nurse visit for wrap change. She reports more odor. She denies pain. ?1/20; patient presents for follow-up. She has been using gentamicin ointment with calcium alginate to the wound bed. She reports improvement in odor and ?drainage. She currently denies systemic signs of infection. ?1/27; patient presents for follow-up. She has been using gentamicin ointment with calcium alginate. She received her Keystone antibiotic 2 days ago. She ?brought this in. Home health has also been established. They will come out for the first time next week. She currently denies signs of infection. She has no ?issues or complaints today. ?2/2; patient presents for follow-up. She has been using Keystone antibiotics with [facility] with no issues. She denies signs of infection. She has no ?issues or complaints today. ?2/9; patient presents for follow-up. She has been using Keystone antibiotics with [facility] with no issues. She has tolerated the compression wrap well. ?She denies signs of infection. ?2/16; patient presents for follow-up. She has been using Keystone antibiotics with Hydrofera Blue under compression wrap with no issues. She currently denies ?signs of infection. ?2/23; patient presents  for follow-up. She continues to use Keystone antibiotics with Hydrofera Blue under the compression wrap. She has no issues or ?complaints today. She denies signs of infection. Home health continues to come out and change the wr

## 2022-04-07 NOTE — Progress Notes (Signed)
?  Subjective:  ?Patient ID: [name], female    DOB: October 29, 1925,  MRN: [id number] ? ?[name] presents to clinic today for for at risk foot care. Patient has h/o PAD and corn(s) left great toe and L 2nd toe and painful thick toenails that are difficult to trim. Painful toenails interfere with ambulation. Aggravating factors include wearing enclosed shoe gear. Pain is relieved with periodic professional debridement. Painful corns are aggravated when weightbearing when wearing enclosed shoe gear. Pain is relieved with periodic professional debridement. ? ?She is accompanied by her daughter on today's visit. She continues to see wound care for LLE. ? ?New problem(s): None.  ? ?PCP is Marda [name], [name] , and last visit was September, 2022. ? ?No Known Allergies ? ?Review of Systems: Negative except as noted in the HPI. ? ?Objective: No changes noted in today's physical examination. ?Physical Exam ? ?General: [name] is a pleasant 88 y.o. African American female, thin build in NAD. AAO x 3.  ? ?Vascular:  ?CFT <3 seconds b/l LE. Palpable DP pulse(s) b/l LE. Diminished PT pulse(s) b/l LE. Pedal hair absent. No pain with calf compression b/l. BK dressing LLE which is clean, dry and intact. Wearing compression stocking RLE. ? ?Dermatological:  ?Area of pressure noted medial aspect left 1st metatarsal head. Skin remains intact and area is dime-size. [name] Pedal skin thin, shiny and atrophic b/l LE. No interdigital macerations noted b/l LE. Minimal hyperkeratosis L hallux and L 2nd toe.  No erythema, no edema, no drainage, no fluctuance.  ? ?Musculoskeletal:  ?Normal muscle strength 5/5 to all lower extremity muscle groups bilaterally. HAV with bunion deformity noted b/l LE. Hammertoe(s) noted to the L 2nd toe.[name] No pain, crepitus or joint limitation noted with ROM b/l LE.  Patient ambulates independently without assistive aids.  ? ?Neurological:  ?Protective sensation intact 5/5 intact bilaterally  with 10g monofilament b/l. Vibratory sensation intact b/l.  ? ?Assessment/Plan: ?1. Pain due to onychomycosis of toenails of both feet   ?2. PAD (peripheral artery disease) ([place])   ?  ?-Examined patient. ?-Patient to continue soft, supportive shoe gear daily. ?-Toenails 1-5 b/l were debrided in length and girth with sterile nail nippers and dremel without iatrogenic bleeding.  ?-Continue toe separator for interdigital corn left hallux/left 2nd toe to afftected digits daily for protection. ?-Patient/POA to call should there be question/concern in the interim.  ? ?Return in about 3 months (around 06/30/2022). ? ?Marzetta Board, DPM  ?

## 2022-04-09 ENCOUNTER — Telehealth: Payer: Self-pay | Admitting: Podiatry

## 2022-04-09 ENCOUNTER — Encounter (HOSPITAL_BASED_OUTPATIENT_CLINIC_OR_DEPARTMENT_OTHER): Payer: Medicare PPO | Admitting: Internal Medicine

## 2022-04-09 DIAGNOSIS — L97822 Non-pressure chronic ulcer of other part of left lower leg with fat layer exposed: Secondary | ICD-10-CM | POA: Diagnosis not present

## 2022-04-09 DIAGNOSIS — G309 Alzheimer's disease, unspecified: Secondary | ICD-10-CM | POA: Diagnosis not present

## 2022-04-09 DIAGNOSIS — I87312 Chronic venous hypertension (idiopathic) with ulcer of left lower extremity: Secondary | ICD-10-CM | POA: Diagnosis not present

## 2022-04-09 DIAGNOSIS — C541 Malignant neoplasm of endometrium: Secondary | ICD-10-CM | POA: Diagnosis not present

## 2022-04-09 DIAGNOSIS — I5042 Chronic combined systolic (congestive) and diastolic (congestive) heart failure: Secondary | ICD-10-CM | POA: Diagnosis not present

## 2022-04-09 DIAGNOSIS — C50919 Malignant neoplasm of unspecified site of unspecified female breast: Secondary | ICD-10-CM | POA: Diagnosis not present

## 2022-04-09 NOTE — Telephone Encounter (Signed)
Pts daughter called and states that someone called and left a message asking if pt was still using the topical cream for her toes. She is still currently using the medication for her toes.

## 2022-04-09 NOTE — Progress Notes (Signed)
[name], [name] ([id number]) Visit Report for 04/09/2022 Chief Complaint Document Details Patient Name: Date of Service: [name], [name] 04/09/2022 10:45 A M Medical Record Number: [id number] Patient Account Number: [id number] Date of Birth/Sex: Treating RN: 1925-06-04 (88 y.o. [name], [name]: Marda Stalker Other Clinician: Referring Provider: Treating Provider/Extender: Aviva Signs in Treatment: 43 Information Obtained from: Patient Chief Complaint Left lower extremity wound Electronic Signature(s) Signed: 04/09/2022 11:35:25 AM By: Kalman Shan DO Entered By: Kalman Shan on 04/09/2022 11:26:43 -------------------------------------------------------------------------------- Debridement Details Patient Name: Date of Service: [name], [name] 04/09/2022 10:45 A M Medical Record Number: [id number] Patient Account Number: [id number] Date of Birth/Sex: Treating RN: [birth year] (88 y.o. [name], [name]: Marda Stalker Other Clinician: Referring Provider: Treating Provider/Extender: Aviva Signs in Treatment: 19 Debridement Performed for Assessment: Wound #26 Left,Lateral Ankle Performed By: Physician Kalman Shan, DO Debridement Type: Debridement Severity of Tissue Pre Debridement: Fat layer exposed Level of Consciousness (Pre-procedure): Awake and Alert Pre-procedure Verification/Time Out Yes - 11:13 Taken: Start Time: 11:13 Pain Control: Lidocaine T Area Debrided (L x W): otal 2.4 (cm) x 1.5 (cm) = 3.6 (cm) Tissue and other material debrided: Viable, Non-Viable, Slough, Subcutaneous, Slough Level: Skin/Subcutaneous Tissue Debridement Description: Excisional Instrument: Curette Bleeding: Minimum Hemostasis Achieved: Pressure End Time: 11:13 Procedural Pain: 0 Post Procedural Pain: 0 Response to Treatment: Procedure was tolerated well Level  of Consciousness (Post- Awake and Alert procedure): Post Debridement Measurements of Total Wound Length: (cm) 2.4 Width: (cm) 1.5 Depth: (cm) 0.2 Volume: (cm) 0.565 Character of Wound/Ulcer Post Debridement: Improved Severity of Tissue Post Debridement: Fat layer exposed Post Procedure Diagnosis Same as Pre-procedure Electronic Signature(s) Signed: 04/09/2022 11:35:25 AM By: Kalman Shan DO Signed: 04/09/2022 12:30:35 PM By: [name] Entered By: Rhae Hammock on 04/09/2022 11:15:32 -------------------------------------------------------------------------------- HPI Details Patient Name: Date of Service: [name], [name] 04/09/2022 10:45 A M Medical Record Number: [id number] Patient Account Number: [id number] Date of Birth/Sex: Treating RN: 1925/01/07 (88 y.o. [name], [name]: Marda Stalker Other Clinician: Referring Provider: Treating Provider/Extender: Aviva Signs in Treatment: 19 History of Present Illness HPI Description: this is a patient we know from several prior wounds on her bilateral lower extremities. She has venous stasis physiology, inflammation and hypertension. She is been fully evaluated for venous ablation and was found not to be a candidate. She does not have significant arterial disease. 02/14/15 the wound itself on her lateral left leg did not look too bad however there was surrounding maceration which was concerning 02/27/15 the wound itself is small with surrounding circumferential epithelialization there is no surrounding maceration. 03/06/15 only a very small area remains. I think this is mostly epithelialized however I would be uncomfortable not dressing this this week 03/13/15 the area is epithelialized. There was a thick surface on this. I took a #15 blade then lightly disrupted it but there does not appear to be any open area I did not see anything but appropriate  epithelium Readmission: 08/16/18 on evaluation today patient presents for initial evaluation and our clinic released readmission although she has not been seen since April 2016. Nonetheless she is having issues with the ulcers currently on the bilateral lateral malleolus or locations as well as the left lateral lower extremity. These have been present for roughly 3 weeks since the patient actually developed significant bilateral lower extremity edema which patient's daughter states was roughly 3 times the size of her legs currently. Subsequently she  ended up in the emerging department due to congestive heart failure. They were able to get the swelling under control and fortunately she seems to be doing much better at this point in time. She was placed in an [employer] which she sat on for week part evaluation today as well. Fortunately the wound areas actually seem to be doing fairly well there is some macerated skin surrounding there are the areas/surface of the wound which are gonna require some debridement at this point. No fevers, chills, nausea, or vomiting noted at this time. Patient has a history of hypertension, mild peripheral vascular disease, chronic venous stasis, and other than the ulcer seems to be doing fairly well today. No fevers, chills, nausea, or vomiting noted at this time. 08/23/18 on evaluation today patient actually appears to be doing great in regard to her bilateral lower should be ulcers. She's shown signs of improvement even just with one week with the [employer]. In general I'm actually very pleased with how things appear at this point. 09/06/18 on evaluation today patient actually appears to be doing much better her left lower extremity is completely healed. Go to the right lower extremity lateral malleolus ulcer this still seems to be showing signs of being open. There does not appear to be evidence of infection which is good news. This did require some sharp  debridement however to remove some of the necrotic tissue/eschar on the surface of the wound Readmission 11/27/2021 [name] is a 88 year old female with a past medical history of endometrial and breast cancer, chronic venous insufficiency, dementia and essential hypertension that presents to the clinic with a 1 month history of nonhealing ulcer to the left lower extremity. She states that the ulcer opened 1 month ago and then healed but has reopened again in the past week. She is currently keeping the area covered. She has a history of wounds to her lower extremities. She has compression stockings that she uses daily. She currently denies signs of infection. 1/13; patient presents for follow-up. She followed up for nurse visit for wrap change. She reports more odor. She denies pain. 1/20; patient presents for follow-up. She has been using gentamicin ointment with calcium alginate to the wound bed. She reports improvement in odor and drainage. She currently denies systemic signs of infection. 1/27; patient presents for follow-up. She has been using gentamicin ointment with calcium alginate. She received her Keystone antibiotic 2 days ago. She brought this in. Home health has also been established. They will come out for the first time next week. She currently denies signs of infection. She has no issues or complaints today. 2/2; patient presents for follow-up. She has been using Keystone antibiotics with [facility] with no issues. She denies signs of infection. She has no issues or complaints today. 2/9; patient presents for follow-up. She has been using Keystone antibiotics with [facility] with no issues. She has tolerated the compression wrap well. She denies signs of infection. 2/16; patient presents for follow-up. She has been using Keystone antibiotics with Hydrofera Blue under compression wrap with no issues. She currently denies signs of infection. 2/23; patient presents  for follow-up. She continues to use Keystone antibiotics with Hydrofera Blue under the compression wrap. She has no issues or complaints today. She denies signs of infection. Home health continues to come out and change the wrap. 3/2; patient presents for follow-up. She continues to use [facility] with [facility] under compression with no issues. She denies signs  of infection. 3/16; patient presents for follow-up. She continues to use [facility] with [facility] under compression with no issues. Home health change the dressing last week. She denies signs of infection. 3/23; patient presents for follow-up. She continues to use Keystone antibiotics with Hydrofera Blue under compression with no issues. Home health continues to change the dressings. She denies signs of infection. 3/30; patient presents for follow-up. She continues to use Keystone antibiotics with Hydrofera Blue under compression with no issues. Home health continues to change the dressings. 4/13 2-week follow-up. Keystone and Hydrofera Blue wound is roughly 2.3 cm in diameter smaller. No other issues 4/20; patient presents for follow-up. She continues to use Keystone antibiotic with [facility] with no issues. 4/28; patient with a difficult wound on her right medial lower leg. Using [facility] Blue minimal improvement per our intake nurse we are using compression. She has home health changing the dressing 5/5; patient presents for follow-up. We have been using Keystone antibiotics and Hydrofera Blue under compression therapy. She has no issues or complaints today. We discussed potentially using an advanced tissue product. She states she has had this done in the past and would like to proceed to see what her insurance would cover. 5/15; patient presents for follow-up. She has been using Keystone antibiotics and Hydrofera Blue under compression therapy. She forgot her Keystone antibiotics today. Insurance has not  approved her for skin substitute. She currently denies signs of infection. She has home health that changes the dressing once a week. 5/19; patient presents for follow-up. We have been using Keystone antibiotic and Hydrofera Blue under compression therapy. She has no issues or complaints today. The [employer] rep confirmed over the phone that the patient has been 100% approved for their skin substitute. We will verify this as we have not received any paperwork stating this. Patient states she would like to proceed with a skin substitute if it is covered by insurance. Electronic Signature(s) Signed: 04/09/2022 11:35:25 AM By: Kalman Shan DO Entered By: Kalman Shan on 04/09/2022 11:30:29 -------------------------------------------------------------------------------- Physical Exam Details Patient Name: Date of Service: [name], [name] 04/09/2022 10:45 A M Medical Record Number: [id number] Patient Account Number: [id number] Date of Birth/Sex: Treating RN: 1925-04-17 (88 y.o. [name], [name]: Marda Stalker Other Clinician: Referring Provider: Treating Provider/Extender: Marjo Bicker Weeks in Treatment: 42 Constitutional respirations regular, non-labored and within target range for patient.. Cardiovascular 2+ dorsalis pedis/posterior tibialis pulses. Psychiatric pleasant and cooperative. Notes Left lower extremity: T the lateral aspect there is an open wound with granulation tissue and nonviable tissue present. No surrounding signs of infection. o Electronic Signature(s) Signed: 04/09/2022 11:35:25 AM By: Kalman Shan DO Entered By: Kalman Shan on 04/09/2022 11:32:13 -------------------------------------------------------------------------------- Physician Orders Details Patient Name: Date of Service: [name], [name] 04/09/2022 10:45 A M Medical Record Number: [id number] Patient Account Number: [id number] Date of  Birth/Sex: Treating RN: 1925/03/06 (88 y.o. Benjaman Lobe Primary Care Provider: Other Clinician: Marda Stalker Referring Provider: Treating Provider/Extender: Aviva Signs in Treatment: 41 Verbal / Phone Orders: No Diagnosis Coding Follow-up Appointments ppointment in 2 weeks. - Dr. Heber Hunnewell and Allayne Butcher Room # 9 Return A ***Continue to bring keystone topical antibiotic medication to each visit.*** Bathing/ Shower/ Hygiene May shower with protection but do not get wound dressing(s) wet. - use cast protector. May shower and wash wound with soap and water. - with dressing changes may wash with soap and water. Edema Control - Lymphedema / SCD / Other  Elevate legs to the level of the heart or above for 30 minutes daily and/or when sitting, a frequency of: Avoid standing for long periods of time. Patient to wear own compression stockings every day. - apply in the morning and remove at night to right leg. Exercise regularly Moisturize legs daily. - every night before day right leg. Additional Orders / Instructions Follow Nutritious Diet - High Protein Diet Home Health No change in wound care orders this week; continue Home Health for wound care. May utilize formulary equivalent dressing for wound treatment orders unless otherwise specified. - ****FOR WEEK OF 04/12/22 HOME HEALTH TO ALSO GO OUT ON FRIDAY 05/ 26/2023 TO CHANGE WRAP**** Home Health for skilled wound care nursing once a week and wound center weekly. Apply keystone topical antibiotics and hydrofera blue classic as primary dressing. NO ZINC OXIDE!! Other Home Health Orders/Instructions: [facility] home health Wound Treatment Wound #26 - Ankle Wound Laterality: Left, Lateral Cleanser: Soap and Water (Home Health) 2 x Per Week/30 Days Discharge Instructions: May shower and wash wound with dial antibacterial soap and water prior to dressing change. Peri-Wound Care: Sween Lotion (Moisturizing  lotion) (Home Health) 2 x Per Week/30 Days Discharge Instructions: Apply moisturizing lotion as directed Topical: Keystone topical Antibiotics (Home Health) 2 x Per Week/30 Days Discharge Instructions: Mix medication according to pharmacy instruction apply to wound bed. Prim Dressing: Hydrofera Blue Classic Foam, 4x4 in (Home Health) 2 x Per Week/30 Days ary Discharge Instructions: Moisten with saline apply over the [facility] medication. Secondary Dressing: Zetuvit Plus 4x8 in [facility]) 2 x Per Week/30 Days Discharge Instructions: Apply over primary dressing as directed. Compression Wrap: ThreePress (3 layer compression wrap) (Home Health) 2 x Per Week/30 Days Discharge Instructions: Apply three layer compression as directed. Electronic Signature(s) Signed: 04/09/2022 11:35:25 AM By: Kalman Shan DO Entered By: Kalman Shan on 04/09/2022 11:32:23 -------------------------------------------------------------------------------- Problem List Details Patient Name: Date of Service: [name], [name] 04/09/2022 10:45 A M Medical Record Number: [id number] Patient Account Number: [id number] Date of Birth/Sex: Treating RN: 01-06-1925 (88 y.o. [name], [name]: Marda Stalker Other Clinician: Referring Provider: Treating Provider/Extender: Aviva Signs in Treatment: 93 Active Problems ICD-10 Encounter Code Description Active Date MDM Diagnosis I87.312 Chronic venous hypertension (idiopathic) with ulcer of left lower extremity 11/27/2021 No Yes L97.822 Non-pressure chronic ulcer of other part of left lower leg with fat layer exposed1/04/2022 No Yes C50.919 Malignant neoplasm of unspecified site of unspecified female breast 11/27/2021 No Yes I50.42 Chronic combined systolic (congestive) and diastolic (congestive) heart failure 11/27/2021 No Yes G30.9 Alzheimer's disease, unspecified 11/27/2021 No Yes C54.1 Malignant neoplasm of  endometrium 11/27/2021 No Yes Inactive Problems Resolved Problems Electronic Signature(s) Signed: 04/09/2022 11:35:25 AM By: Kalman Shan DO Entered By: Kalman Shan on 04/09/2022 11:26:19 -------------------------------------------------------------------------------- Progress Note Details Patient Name: Date of Service: [name], [name] 04/09/2022 10:45 A M Medical Record Number: [id number] Patient Account Number: [id number] Date of Birth/Sex: Treating RN: 1925/03/28 (88 y.o. [name], [name]: Marda Stalker Other Clinician: Referring Provider: Treating Provider/Extender: Aviva Signs in Treatment: 36 Subjective Chief Complaint Information obtained from Patient Left lower extremity wound History of Present Illness (HPI) this is a patient we know from several prior wounds on her bilateral lower extremities. She has venous stasis physiology, inflammation and hypertension. She is been fully evaluated for venous ablation and was found not to be a candidate. She does not have significant arterial disease. 02/14/15 the wound itself on her  lateral left leg did not look too bad however there was surrounding maceration which was concerning 02/27/15 the wound itself is small with surrounding circumferential epithelialization there is no surrounding maceration. 03/06/15 only a very small area remains. I think this is mostly epithelialized however I would be uncomfortable not dressing this this week 03/13/15 the area is epithelialized. There was a thick surface on this. I took a #15 blade then lightly disrupted it but there does not appear to be any open area I did not see anything but appropriate epithelium Readmission: 08/16/18 on evaluation today patient presents for initial evaluation and our clinic released readmission although she has not been seen since April 2016. Nonetheless she is having issues with the ulcers currently on the  bilateral lateral malleolus or locations as well as the left lateral lower extremity. These have been present for roughly 3 weeks since the patient actually developed significant bilateral lower extremity edema which patient's daughter states was roughly 3 times the size of her legs currently. Subsequently she ended up in the emerging department due to congestive heart failure. They were able to get the swelling under control and fortunately she seems to be doing much better at this point in time. She was placed in an [employer] which she sat on for week part evaluation today as well. Fortunately the wound areas actually seem to be doing fairly well there is some macerated skin surrounding there are the areas/surface of the wound which are gonna require some debridement at this point. No fevers, chills, nausea, or vomiting noted at this time. Patient has a history of hypertension, mild peripheral vascular disease, chronic venous stasis, and other than the ulcer seems to be doing fairly well today. No fevers, chills, nausea, or vomiting noted at this time. 08/23/18 on evaluation today patient actually appears to be doing great in regard to her bilateral lower should be ulcers. She's shown signs of improvement even just with one week with the [employer]. In general I'm actually very pleased with how things appear at this point. 09/06/18 on evaluation today patient actually appears to be doing much better her left lower extremity is completely healed. Go to the right lower extremity lateral malleolus ulcer this still seems to be showing signs of being open. There does not appear to be evidence of infection which is good news. This did require some sharp debridement however to remove some of the necrotic tissue/eschar on the surface of the wound Readmission 11/27/2021 [name] is a 88 year old female with a past medical history of endometrial and breast cancer, chronic venous  insufficiency, dementia and essential hypertension that presents to the clinic with a 1 month history of nonhealing ulcer to the left lower extremity. She states that the ulcer opened 1 month ago and then healed but has reopened again in the past week. She is currently keeping the area covered. She has a history of wounds to her lower extremities. She has compression stockings that she uses daily. She currently denies signs of infection. 1/13; patient presents for follow-up. She followed up for nurse visit for wrap change. She reports more odor. She denies pain. 1/20; patient presents for follow-up. She has been using gentamicin ointment with calcium alginate to the wound bed. She reports improvement in odor and drainage. She currently denies systemic signs of infection. 1/27; patient presents for follow-up. She has been using gentamicin ointment with calcium alginate. She received her Keystone antibiotic 2 days ago. She brought this  in. Home health has also been established. They will come out for the first time next week. She currently denies signs of infection. She has no issues or complaints today. 2/2; patient presents for follow-up. She has been using Keystone antibiotics with [facility] with no issues. She denies signs of infection. She has no issues or complaints today. 2/9; patient presents for follow-up. She has been using Keystone antibiotics with [facility] with no issues. She has tolerated the compression wrap well. She denies signs of infection. 2/16; patient presents for follow-up. She has been using Keystone antibiotics with Hydrofera Blue under compression wrap with no issues. She currently denies signs of infection. 2/23; patient presents for follow-up. She continues to use Keystone antibiotics with Hydrofera Blue under the compression wrap. She has no issues or complaints today. She denies signs of infection. Home health continues to come out and change the wrap. 3/2;  patient presents for follow-up. She continues to use [facility] with [facility] under compression with no issues. She denies signs of infection. 3/16; patient presents for follow-up. She continues to use [facility] with [facility] under compression with no issues. Home health change the dressing last week. She denies signs of infection. 3/23; patient presents for follow-up. She continues to use Keystone antibiotics with Hydrofera Blue under compression with no issues. Home health continues to change the dressings. She denies signs of infection. 3/30; patient presents for follow-up. She continues to use Keystone antibiotics with Hydrofera Blue under compression with no issues. Home health continues to change the dressings. 4/13 2-week follow-up. Keystone and Hydrofera Blue wound is roughly 2.3 cm in diameter smaller. No other issues 4/20; patient presents for follow-up. She continues to use Keystone antibiotic with [facility] with no issues. 4/28; patient with a difficult wound on her right medial lower leg. Using [facility] Blue minimal improvement per our intake nurse we are using compression. She has home health changing the dressing 5/5; patient presents for follow-up. We have been using Keystone antibiotics and Hydrofera Blue under compression therapy. She has no issues or complaints today. We discussed potentially using an advanced tissue product. She states she has had this done in the past and would like to proceed to see what her insurance would cover. 5/15; patient presents for follow-up. She has been using Keystone antibiotics and Hydrofera Blue under compression therapy. She forgot her Keystone antibiotics today. Insurance has not approved her for skin substitute. She currently denies signs of infection. She has home health that changes the dressing once a week. 5/19; patient presents for follow-up. We have been using Keystone antibiotic and Hydrofera Blue under  compression therapy. She has no issues or complaints today. The [employer] rep confirmed over the phone that the patient has been 100% approved for their skin substitute. We will verify this as we have not received any paperwork stating this. Patient states she would like to proceed with a skin substitute if it is covered by insurance. Patient History Information obtained from Patient. Family History Cancer - Child, Hypertension - Child,Father, Kidney Disease - Child, Stroke - Siblings,Child, Thyroid Problems - Child, No family history of Diabetes, Heart Disease, Hereditary Spherocytosis, Lung Disease, Seizures, Tuberculosis. Social History Never smoker, Marital Status - Widowed, Alcohol Use - Never, Drug Use - No History, Caffeine Use - Never. Medical History Eyes Denies history of Cataracts, Glaucoma, Optic Neuritis Ear/Nose/Mouth/Throat Denies history of Chronic sinus problems/congestion, Middle ear problems Hematologic/Lymphatic Patient has history of Anemia Denies history of Hemophilia, Human Immunodeficiency  Virus, Lymphedema, Sickle Cell Disease Respiratory Denies history of Aspiration, Asthma, Chronic Obstructive Pulmonary Disease (COPD), Pneumothorax, Sleep Apnea, Tuberculosis Cardiovascular Patient has history of Congestive Heart Failure, Hypertension, Peripheral Venous Disease Denies history of Angina, Arrhythmia, Coronary Artery Disease, Hypotension, Myocardial Infarction, Peripheral Arterial Disease, Phlebitis, Vasculitis Gastrointestinal Denies history of Cirrhosis , Colitis, Crohnoos, Hepatitis A, Hepatitis B, Hepatitis C Endocrine Denies history of Type I Diabetes, Type II Diabetes Genitourinary Denies history of End Stage Renal Disease Immunological Denies history of Lupus Erythematosus, Raynaudoos, Scleroderma Integumentary (Skin) Denies history of History of Burn Musculoskeletal Patient has history of Osteoarthritis Denies history of Gout, Rheumatoid Arthritis,  Osteomyelitis Neurologic Denies history of Neuropathy, Quadriplegia, Paraplegia, Seizure Disorder Oncologic Patient has history of Received Chemotherapy - Breast and Ovarian Cancer Denies history of Received Radiation Hospitalization/Surgery History - diverticulits. Objective Constitutional respirations regular, non-labored and within target range for patient.. Vitals Time Taken: 10:38 AM, Height: 66 in, Weight: 176 lbs, BMI: 28.4, Temperature: 98.3 F, Pulse: 55 bpm, Respiratory Rate: 17 breaths/min, Blood Pressure: 134/77 mmHg. Cardiovascular 2+ dorsalis pedis/posterior tibialis pulses. Psychiatric pleasant and cooperative. General Notes: Left lower extremity: T the lateral aspect there is an open wound with granulation tissue and nonviable tissue present. No surrounding signs of o infection. Integumentary (Hair, Skin) Wound #26 status is Open. Original cause of wound was Gradually Appeared. The date acquired was: 11/20/2021. The wound has been in treatment 19 weeks. The wound is located on the Left,Lateral Ankle. The wound measures 2.4cm length x 1.5cm width x 0.2cm depth; 2.827cm^2 area and 0.565cm^3 volume. There is Fat Layer (Subcutaneous Tissue) exposed. There is no tunneling or undermining noted. There is a medium amount of serosanguineous drainage noted. The wound margin is distinct with the outline attached to the wound base. There is large (67-100%) red granulation within the wound bed. There is a small (1-33%) amount of necrotic tissue within the wound bed including Adherent Slough. Assessment Active Problems ICD-10 Chronic venous hypertension (idiopathic) with ulcer of left lower extremity Non-pressure chronic ulcer of other part of left lower leg with fat layer exposed Malignant neoplasm of unspecified site of unspecified female breast Chronic combined systolic (congestive) and diastolic (congestive) heart failure Alzheimer's disease, unspecified Malignant neoplasm of  endometrium Patient's wound has shown improvement in size and appearance since last clinic visit. I debrided nonviable tissue. I recommended for now continue with Keystone antibiotic and Hydrofera Blue under compression therapy. If Leroy Kennedy is covered by her insurance we will go ahead and order this For next clinic visit. Patient has home health that comes out weekly to change the dressing. Follow-up in 2 weeks. Procedures Wound #26 Pre-procedure diagnosis of Wound #26 is a Venous Leg Ulcer located on the Left,Lateral Ankle .Severity of Tissue Pre Debridement is: Fat layer exposed. There was a Excisional Skin/Subcutaneous Tissue Debridement with a total area of 3.6 sq cm performed by Kalman Shan, DO. With the following instrument(s): Curette to remove Viable and Non-Viable tissue/material. Material removed includes Subcutaneous Tissue and Slough and after achieving pain control using Lidocaine. No specimens were taken. A time out was conducted at 11:13, prior to the start of the procedure. A Minimum amount of bleeding was controlled with Pressure. The procedure was tolerated well with a pain level of 0 throughout and a pain level of 0 following the procedure. Post Debridement Measurements: 2.4cm length x 1.5cm width x 0.2cm depth; 0.565cm^3 volume. Character of Wound/Ulcer Post Debridement is improved. Severity of Tissue Post Debridement is: Fat layer exposed. Post procedure Diagnosis  Wound #26: Same as Pre-Procedure Pre-procedure diagnosis of Wound #26 is a Venous Leg Ulcer located on the Left,Lateral Ankle . There was a Three Layer Compression Therapy Procedure by Rhae Hammock, RN. Post procedure Diagnosis Wound #26: Same as Pre-Procedure Plan Follow-up Appointments: Return Appointment in 2 weeks. - Dr. Heber [place] and [facility] Room # 9 ***Continue to bring keystone topical antibiotic medication to each visit.*** Bathing/ Shower/ Hygiene: May shower with protection but do not get wound  dressing(s) wet. - use cast protector. May shower and wash wound with soap and water. - with dressing changes may wash with soap and water. Edema Control - Lymphedema / SCD / Other: Elevate legs to the level of the heart or above for 30 minutes daily and/or when sitting, a frequency of: Avoid standing for long periods of time. Patient to wear own compression stockings every day. - apply in the morning and remove at night to right leg. Exercise regularly Moisturize legs daily. - every night before day right leg. Additional Orders / Instructions: Follow Nutritious Diet - High Protein Diet Home Health: No change in wound care orders this week; continue Home Health for wound care. May utilize formulary equivalent dressing for wound treatment orders unless otherwise specified. - ****FOR WEEK OF 04/12/22 HOME HEALTH TO ALSO GO OUT ON FRIDAY 05/ 26/2023 TO CHANGE WRAP**** Home Health for skilled wound care nursing once a week and wound center weekly. Apply keystone topical antibiotics and hydrofera blue classic as primary dressing. NO ZINC OXIDE!! Other Home Health Orders/Instructions: [facility] home health WOUND #26: - Ankle Wound Laterality: Left, Lateral Cleanser: Soap and Water (Home Health) 2 x Per Week/30 Days Discharge Instructions: May shower and wash wound with dial antibacterial soap and water prior to dressing change. Peri-Wound Care: Sween Lotion (Moisturizing lotion) (Home Health) 2 x Per Week/30 Days Discharge Instructions: Apply moisturizing lotion as directed Topical: Keystone topical Antibiotics (Home Health) 2 x Per Week/30 Days Discharge Instructions: Mix medication according to pharmacy instruction apply to wound bed. Prim Dressing: Hydrofera Blue Classic Foam, 4x4 in (Home Health) 2 x Per Week/30 Days ary Discharge Instructions: Moisten with saline apply over the [facility] medication. Secondary Dressing: Zetuvit Plus 4x8 in [facility]) 2 x Per Week/30 Days Discharge  Instructions: Apply over primary dressing as directed. Com pression Wrap: ThreePress (3 layer compression wrap) (Home Health) 2 x Per Week/30 Days Discharge Instructions: Apply three layer compression as directed. 1. In office sharp debridement 2. Keystone antibiotic and Hydrofera Blue under 3 layer compression 3. Follow-up in 2 weeks Electronic Signature(s) Signed: 04/09/2022 11:35:25 AM By: Kalman Shan DO Entered By: Kalman Shan on 04/09/2022 11:34:34 -------------------------------------------------------------------------------- HxROS Details Patient Name: Date of Service: [name], [name] 04/09/2022 10:45 A M Medical Record Number: [id number] Patient Account Number: [id number] Date of Birth/Sex: Treating RN: [birth year] (88 y.o. [name], [name]: Marda Stalker Other Clinician: Referring Provider: Treating Provider/Extender: Aviva Signs in Treatment: 24 Information Obtained From Patient Eyes Medical History: Negative for: Cataracts; Glaucoma; Optic Neuritis Ear/Nose/Mouth/Throat Medical History: Negative for: Chronic sinus problems/congestion; Middle ear problems Hematologic/Lymphatic Medical History: Positive for: Anemia Negative for: Hemophilia; Human Immunodeficiency Virus; Lymphedema; Sickle Cell Disease Respiratory Medical History: Negative for: Aspiration; Asthma; Chronic Obstructive Pulmonary Disease (COPD); Pneumothorax; Sleep Apnea; Tuberculosis Cardiovascular Medical History: Positive for: Congestive Heart Failure; Hypertension; Peripheral Venous Disease Negative for: Angina; Arrhythmia; Coronary Artery Disease; Hypotension; Myocardial Infarction; Peripheral Arterial Disease; Phlebitis; Vasculitis Gastrointestinal Medical History: Negative for: Cirrhosis ; Colitis; Crohns; Hepatitis A; Hepatitis  B; Hepatitis C Endocrine Medical History: Negative for: Type I Diabetes; Type II  Diabetes Genitourinary Medical History: Negative for: End Stage Renal Disease Immunological Medical History: Negative for: Lupus Erythematosus; Raynauds; Scleroderma Integumentary (Skin) Medical History: Negative for: History of Burn Musculoskeletal Medical History: Positive for: Osteoarthritis Negative for: Gout; Rheumatoid Arthritis; Osteomyelitis Neurologic Medical History: Negative for: Neuropathy; Quadriplegia; Paraplegia; Seizure Disorder Oncologic Medical History: Positive for: Received Chemotherapy - Breast and Ovarian Cancer Negative for: Received Radiation Immunizations Pneumococcal Vaccine: Received Pneumococcal Vaccination: Yes Received Pneumococcal Vaccination On or After 60th Birthday: Yes Implantable Devices None Hospitalization / Surgery History Type of Hospitalization/Surgery diverticulits Family and Social History Cancer: Yes - Child; Diabetes: No; Heart Disease: No; Hereditary Spherocytosis: No; Hypertension: Yes - Child,Father; Kidney Disease: Yes - Child; Lung Disease: No; Seizures: No; Stroke: Yes - Siblings,Child; Thyroid Problems: Yes - Child; Tuberculosis: No; Never smoker; Marital Status - Widowed; Alcohol Use: Never; Drug Use: No History; Caffeine Use: Never; Financial Concerns: No; Food, Clothing or Shelter Needs: No; Support System Lacking: No; Transportation Concerns: No Electronic Signature(s) Signed: 04/09/2022 11:35:25 AM By: Kalman Shan DO Signed: 04/09/2022 12:30:35 PM By: [name] Entered By: Kalman Shan on 04/09/2022 11:30:35 -------------------------------------------------------------------------------- SuperBill Details Patient Name: Date of Service: MERRILL, DEANDA [name] 04/09/2022 Medical Record Number: [id number] Patient Account Number: [id number] Date of Birth/Sex: Treating RN: 12-17-1924 (89 y.o. [name], [name]: Marda Stalker Other Clinician: Referring Provider: Treating  Provider/Extender: Aviva Signs in Treatment: 19 Diagnosis Coding ICD-10 Codes Code Description [phone] Chronic venous hypertension (idiopathic) with ulcer of left lower extremity L97.822 Non-pressure chronic ulcer of other part of left lower leg with fat layer exposed C50.919 Malignant neoplasm of unspecified site of unspecified female breast I50.42 Chronic combined systolic (congestive) and diastolic (congestive) heart failure G30.9 Alzheimer's disease, unspecified C54.1 Malignant neoplasm of endometrium Facility Procedures CPT4 Code: [id number] Description: 08676 - DEB SUBQ TISSUE 20 SQ CM/< ICD-10 Diagnosis Description L97.822 Non-pressure chronic ulcer of other part of left lower leg with fat layer expo Modifier: sed Quantity: 1 Physician Procedures : CPT4 Code Description Modifier [id number] - WC PHYS SUBQ TISS 20 SQ CM ICD-10 Diagnosis Description L97.822 Non-pressure chronic ulcer of other part of left lower leg with fat layer exposed Quantity: 1 Electronic Signature(s) Signed: 04/09/2022 11:35:25 AM By: Kalman Shan DO Entered By: Kalman Shan on 04/09/2022 11:34:53

## 2022-04-09 NOTE — Telephone Encounter (Signed)
I spoke to pts dauighter to let her know about the refill. She is aware. Thank you

## 2022-04-18 ENCOUNTER — Other Ambulatory Visit: Payer: Self-pay | Admitting: Cardiology

## 2022-04-18 DIAGNOSIS — I5042 Chronic combined systolic (congestive) and diastolic (congestive) heart failure: Secondary | ICD-10-CM

## 2022-04-22 ENCOUNTER — Encounter (HOSPITAL_BASED_OUTPATIENT_CLINIC_OR_DEPARTMENT_OTHER): Payer: Medicare PPO | Attending: Internal Medicine | Admitting: Internal Medicine

## 2022-04-22 DIAGNOSIS — Z853 Personal history of malignant neoplasm of breast: Secondary | ICD-10-CM | POA: Insufficient documentation

## 2022-04-22 DIAGNOSIS — I87312 Chronic venous hypertension (idiopathic) with ulcer of left lower extremity: Secondary | ICD-10-CM | POA: Insufficient documentation

## 2022-04-22 DIAGNOSIS — I110 Hypertensive heart disease with heart failure: Secondary | ICD-10-CM | POA: Diagnosis not present

## 2022-04-22 DIAGNOSIS — C541 Malignant neoplasm of endometrium: Secondary | ICD-10-CM | POA: Diagnosis not present

## 2022-04-22 DIAGNOSIS — G309 Alzheimer's disease, unspecified: Secondary | ICD-10-CM | POA: Insufficient documentation

## 2022-04-22 DIAGNOSIS — L97822 Non-pressure chronic ulcer of other part of left lower leg with fat layer exposed: Secondary | ICD-10-CM

## 2022-04-22 DIAGNOSIS — I5042 Chronic combined systolic (congestive) and diastolic (congestive) heart failure: Secondary | ICD-10-CM | POA: Diagnosis not present

## 2022-04-22 DIAGNOSIS — Z8543 Personal history of malignant neoplasm of ovary: Secondary | ICD-10-CM | POA: Diagnosis not present

## 2022-04-23 NOTE — Progress Notes (Signed)
[name], [name] ([id number]) Visit Report for 04/22/2022 Chief Complaint Document Details Patient Name: Date of Service: [name], [name] 04/22/2022 10:00 A M Medical Record Number: [id number] Patient Account Number: [id number] Date of Birth/Sex: Treating RN: July 17, 1925 (88 y.o. [name] Primary Care Provider: Marda [name] Other Clinician: Referring Provider: Treating Provider/Extender: [name] Signs in Treatment: 20 Information Obtained from: Patient Chief Complaint Left lower extremity wound Electronic Signature(s) Signed: 04/22/2022 1:29:37 PM By: [name] Shan DO Entered By: [name] on 04/22/2022 11:56:23 -------------------------------------------------------------------------------- Debridement Details Patient Name: Date of Service: [name], [name] 04/22/2022 10:00 [name] Record Number: [id number] Patient Account Number: [id number] Date of Birth/Sex: Treating RN: 06-16-1925 (88 y.o. [name] Primary Care Provider: Marda [name] Other Clinician: Referring Provider: Treating Provider/Extender: [name] Signs in Treatment: 20 Debridement Performed for Assessment: Wound #26 Left,Lateral Ankle Performed By: Physician [name] Shan, DO Debridement Type: Debridement Severity of Tissue Pre Debridement: Fat layer exposed Level of Consciousness (Pre-procedure): Awake and Alert Pre-procedure Verification/Time Out Yes - 10:30 Taken: Start Time: 10:30 Pain Control: Lidocaine T Area Debrided (L x W): otal 2.4 (cm) x 1.5 (cm) = 3.6 (cm) Tissue and other material debrided: Viable, Non-Viable, Slough, Subcutaneous, Slough Level: Skin/Subcutaneous Tissue Debridement Description: Excisional Instrument: Curette Bleeding: Minimum Hemostasis Achieved: Pressure End Time: 10:30 Procedural Pain: 0 Post Procedural Pain: 0 Response to Treatment: Procedure was tolerated well Level of  Consciousness (Post- Awake and Alert procedure): Post Debridement Measurements of Total Wound Length: (cm) 2.4 Width: (cm) 1.5 Depth: (cm) 0.2 Volume: (cm) 0.565 Character of Wound/Ulcer Post Debridement: Improved Severity of Tissue Post Debridement: Fat layer exposed Post Procedure Diagnosis Same as Pre-procedure Electronic Signature(s) Signed: 04/22/2022 1:29:37 PM By: [name] Shan DO Signed: 04/23/2022 11:55:08 AM By: [name] Entered By: Rhae Hammock on 04/22/2022 10:34:03 -------------------------------------------------------------------------------- HPI Details Patient Name: Date of Service: [name], [name] 04/22/2022 10:00 [place] Record Number: [id number] Patient Account Number: [id number] Date of Birth/Sex: Treating RN: 1925/09/30 (88 y.o. [name] Primary Care Provider: Marda [name] Other Clinician: Referring Provider: Treating Provider/Extender: [name] Signs in Treatment: 20 History of Present Illness HPI Description: this is a patient we know from several prior wounds on her bilateral lower extremities. She has venous stasis physiology, inflammation and hypertension. She is been fully evaluated for venous ablation and was found not to be a candidate. She does not have significant arterial disease. 02/14/15 the wound itself on her lateral left leg did not look too bad however there was surrounding maceration which was concerning 02/27/15 the wound itself is small with surrounding circumferential epithelialization there is no surrounding maceration. 03/06/15 only a very small area remains. I think this is mostly epithelialized however I would be uncomfortable not dressing this this week 03/13/15 the area is epithelialized. There was a thick surface on this. I took a #15 blade then lightly disrupted it but there does not appear to be any open area I did not see anything but appropriate  epithelium Readmission: 08/16/18 on evaluation today patient presents for initial evaluation and our clinic released readmission although she has not been seen since April 2016. Nonetheless she is having issues with the ulcers currently on the bilateral lateral malleolus or locations as well as the left lateral lower extremity. These have been present for roughly 3 weeks since the patient actually developed significant bilateral lower extremity edema which patient's daughter states was roughly 3 times the size of her legs currently. Subsequently she  ended up in the emerging department due to congestive heart failure. They were able to get the swelling under control and fortunately she seems to be doing much better at this point in time. She was placed in an [employer] which she sat on for week part evaluation today as well. Fortunately the wound areas actually seem to be doing fairly well there is some macerated skin surrounding there are the areas/surface of the wound which are gonna require some debridement at this point. No fevers, chills, nausea, or vomiting noted at this time. Patient has a history of hypertension, mild peripheral vascular disease, chronic venous stasis, and other than the ulcer seems to be doing fairly well today. No fevers, chills, nausea, or vomiting noted at this time. 08/23/18 on evaluation today patient actually appears to be doing great in regard to her bilateral lower should be ulcers. She's shown signs of improvement even just with one week with the [employer]. In general I'm actually very pleased with how things appear at this point. 09/06/18 on evaluation today patient actually appears to be doing much better her left lower extremity is completely healed. Go to the right lower extremity lateral malleolus ulcer this still seems to be showing signs of being open. There does not appear to be evidence of infection which is good news. This did require some sharp  debridement however to remove some of the necrotic tissue/eschar on the surface of the wound Readmission 11/27/2021 [name] is a 88 year old female with a past medical history of endometrial and breast cancer, chronic venous insufficiency, dementia and essential hypertension that presents to the clinic with a 1 month history of nonhealing ulcer to the left lower extremity. She states that the ulcer opened 1 month ago and then healed but has reopened again in the past week. She is currently keeping the area covered. She has a history of wounds to her lower extremities. She has compression stockings that she uses daily. She currently denies signs of infection. 1/13; patient presents for follow-up. She followed up for nurse visit for wrap change. She reports more odor. She denies pain. 1/20; patient presents for follow-up. She has been using gentamicin ointment with calcium alginate to the wound bed. She reports improvement in odor and drainage. She currently denies systemic signs of infection. 1/27; patient presents for follow-up. She has been using gentamicin ointment with calcium alginate. She received her Keystone antibiotic 2 days ago. She brought this in. Home health has also been established. They will come out for the first time next week. She currently denies signs of infection. She has no issues or complaints today. 2/2; patient presents for follow-up. She has been using Keystone antibiotics with [facility] with no issues. She denies signs of infection. She has no issues or complaints today. 2/9; patient presents for follow-up. She has been using Keystone antibiotics with [facility] with no issues. She has tolerated the compression wrap well. She denies signs of infection. 2/16; patient presents for follow-up. She has been using Keystone antibiotics with Hydrofera Blue under compression wrap with no issues. She currently denies signs of infection. 2/23; patient presents  for follow-up. She continues to use Keystone antibiotics with Hydrofera Blue under the compression wrap. She has no issues or complaints today. She denies signs of infection. Home health continues to come out and change the wrap. 3/2; patient presents for follow-up. She continues to use [facility] with [facility] under compression with no issues. She denies signs  of infection. 3/16; patient presents for follow-up. She continues to use [facility] with [facility] under compression with no issues. Home health change the dressing last week. She denies signs of infection. 3/23; patient presents for follow-up. She continues to use Keystone antibiotics with Hydrofera Blue under compression with no issues. Home health continues to change the dressings. She denies signs of infection. 3/30; patient presents for follow-up. She continues to use Keystone antibiotics with Hydrofera Blue under compression with no issues. Home health continues to change the dressings. 4/13 2-week follow-up. Keystone and Hydrofera Blue wound is roughly 2.3 cm in diameter smaller. No other issues 4/20; patient presents for follow-up. She continues to use Keystone antibiotic with [facility] with no issues. 4/28; patient with a difficult wound on her right medial lower leg. Using [facility] Blue minimal improvement per our intake nurse we are using compression. She has home health changing the dressing 5/5; patient presents for follow-up. We have been using Keystone antibiotics and Hydrofera Blue under compression therapy. She has no issues or complaints today. We discussed potentially using an advanced tissue product. She states she has had this done in the past and would like to proceed to see what her insurance would cover. 5/15; patient presents for follow-up. She has been using Keystone antibiotics and Hydrofera Blue under compression therapy. She forgot her Keystone antibiotics today. Insurance has not  approved her for skin substitute. She currently denies signs of infection. She has home health that changes the dressing once a week. 5/19; patient presents for follow-up. We have been using Keystone antibiotic and Hydrofera Blue under compression therapy. She has no issues or complaints today. The [employer] rep confirmed over the phone that the patient has been 100% approved for their skin substitute. We will verify this as we have not received any paperwork stating this. Patient states she would like to proceed with a skin substitute if it is covered by insurance. 6/1; patient presents for follow-up. We have been using Keystone antibiotic and Hydrofera Blue under compression therapy. We confirmed that she does have a co-pay for the [employer] skin substitute. We will hold off on using this for now since she continues to show improvement with current therapy. She denies signs of infection. She has home health that comes out twice weekly. Electronic Signature(s) Signed: 04/22/2022 1:29:37 PM By: [name] Shan DO Entered By: [name] on 04/22/2022 11:57:23 -------------------------------------------------------------------------------- Physical Exam Details Patient Name: Date of Service: [name], [name] 04/22/2022 10:00 A M Medical Record Number: [id number] Patient Account Number: [id number] Date of Birth/Sex: Treating RN: 1925-09-06 (88 y.o. [name] Primary Care Provider: Marda [name] Other Clinician: Referring Provider: Treating Provider/Extender: Marjo Bicker Weeks in Treatment: 20 Constitutional respirations regular, non-labored and within target range for patient.. Cardiovascular 2+ dorsalis pedis/posterior tibialis pulses. Psychiatric pleasant and cooperative. Notes Left lower extremity: T the lateral aspect there is an open wound with granulation tissue and nonviable tissue present. No surrounding signs of infection. o Electronic  Signature(s) Signed: 04/22/2022 1:29:37 PM By: [name] Shan DO Entered By: [name] on 04/22/2022 12:01:55 -------------------------------------------------------------------------------- Physician Orders Details Patient Name: Date of Service: [name], [name] 04/22/2022 10:00 A M Medical Record Number: [id number] Patient Account Number: [id number] Date of Birth/Sex: Treating RN: 1925/10/03 (88 y.o. [name] Primary Care Provider: Marda [name] Other Clinician: Referring Provider: Treating Provider/Extender: [name] Signs in Treatment: 20 Verbal / Phone Orders: No Diagnosis Coding Follow-up Appointments ppointment in 2 weeks. - Dr. Heber [place]  and [place] Room # 9 Return A ***Continue to bring keystone topical antibiotic medication to each visit.*** Bathing/ Shower/ Hygiene May shower with protection but do not get wound dressing(s) wet. - use cast protector. May shower and wash wound with soap and water. - with dressing changes may wash with soap and water. Edema Control - Lymphedema / SCD / Other Elevate legs to the level of the heart or above for 30 minutes daily and/or when sitting, a frequency of: Avoid standing for long periods of time. Patient to wear own compression stockings every day. - apply in the morning and remove at night to right leg. Exercise regularly Moisturize legs daily. - every night before day right leg. Additional Orders / Instructions Follow Nutritious Diet - High Protein Diet Home Health No change in wound care orders this week; continue Home Health for wound care. May utilize formulary equivalent dressing for wound treatment orders unless otherwise specified. - ****FOR WEEK OF 04/26/22 HOME HEALTH TO GO OUT TWICE TO CHANGE WRAP**** Home Health for skilled wound care nursing once a week and wound center weekly. Apply keystone topical antibiotics and hydrofera blue classic as primary dressing. NO ZINC  OXIDE!! Other Home Health Orders/Instructions: [facility] home health Wound Treatment Wound #26 - Ankle Wound Laterality: Left, Lateral Cleanser: Soap and Water (Home Health) 2 x Per Week/30 Days Discharge Instructions: May shower and wash wound with dial antibacterial soap and water prior to dressing change. Peri-Wound Care: Sween Lotion (Moisturizing lotion) (Home Health) 2 x Per Week/30 Days Discharge Instructions: Apply moisturizing lotion as directed Topical: Keystone topical Antibiotics (Home Health) 2 x Per Week/30 Days Discharge Instructions: Mix medication according to pharmacy instruction apply to wound bed. Prim Dressing: Hydrofera Blue Classic Foam, 4x4 in (Home Health) 2 x Per Week/30 Days ary Discharge Instructions: Moisten with saline apply over the [facility] medication. Secondary Dressing: Zetuvit Plus 4x8 in [facility]) 2 x Per Week/30 Days Discharge Instructions: Apply over primary dressing as directed. Compression Wrap: ThreePress (3 layer compression wrap) (Home Health) 2 x Per Week/30 Days Discharge Instructions: Apply three layer compression as directed. Electronic Signature(s) Signed: 04/22/2022 1:29:37 PM By: [name] Shan DO Entered By: [name] on 04/22/2022 12:02:03 -------------------------------------------------------------------------------- Problem List Details Patient Name: Date of Service: [name], [name] 04/22/2022 10:00 A M Medical Record Number: [id number] Patient Account Number: [id number] Date of Birth/Sex: Treating RN: 1925/02/23 (88 y.o. [name] Primary Care Provider: Marda [name] Other Clinician: Referring Provider: Treating Provider/Extender: [name] Signs in Treatment: 20 Active Problems ICD-10 Encounter Code Description Active Date MDM Diagnosis I87.312 Chronic venous hypertension (idiopathic) with ulcer of left lower extremity 11/27/2021 No Yes L97.822 Non-pressure chronic ulcer  of other part of left lower leg with fat layer exposed1/04/2022 No Yes C50.919 Malignant neoplasm of unspecified site of unspecified female breast 11/27/2021 No Yes I50.42 Chronic combined systolic (congestive) and diastolic (congestive) heart failure 11/27/2021 No Yes G30.9 Alzheimer's disease, unspecified 11/27/2021 No Yes C54.1 Malignant neoplasm of endometrium 11/27/2021 No Yes Inactive Problems Resolved Problems Electronic Signature(s) Signed: 04/22/2022 1:29:37 PM By: [name] Shan DO Entered By: [name] on 04/22/2022 11:56:06 -------------------------------------------------------------------------------- Progress Note Details Patient Name: Date of Service: [name], [name] 04/22/2022 10:00 [place] Record Number: [id number] Patient Account Number: [id number] Date of Birth/Sex: Treating RN: Mar 29, 1925 (88 y.o. [name] Primary Care Provider: Marda [name] Other Clinician: Referring Provider: Treating Provider/Extender: [name] Signs in Treatment: 20 Subjective Chief Complaint Information obtained from Patient Left lower extremity  wound History of Present Illness (HPI) this is a patient we know from several prior wounds on her bilateral lower extremities. She has venous stasis physiology, inflammation and hypertension. She is been fully evaluated for venous ablation and was found not to be a candidate. She does not have significant arterial disease. 02/14/15 the wound itself on her lateral left leg did not look too bad however there was surrounding maceration which was concerning 02/27/15 the wound itself is small with surrounding circumferential epithelialization there is no surrounding maceration. 03/06/15 only a very small area remains. I think this is mostly epithelialized however I would be uncomfortable not dressing this this week 03/13/15 the area is epithelialized. There was a thick surface on this. I took a #15 blade then lightly  disrupted it but there does not appear to be any open area I did not see anything but appropriate epithelium Readmission: 08/16/18 on evaluation today patient presents for initial evaluation and our clinic released readmission although she has not been seen since April 2016. Nonetheless she is having issues with the ulcers currently on the bilateral lateral malleolus or locations as well as the left lateral lower extremity. These have been present for roughly 3 weeks since the patient actually developed significant bilateral lower extremity edema which patient's daughter states was roughly 3 times the size of her legs currently. Subsequently she ended up in the emerging department due to congestive heart failure. They were able to get the swelling under control and fortunately she seems to be doing much better at this point in time. She was placed in an [employer] which she sat on for week part evaluation today as well. Fortunately the wound areas actually seem to be doing fairly well there is some macerated skin surrounding there are the areas/surface of the wound which are gonna require some debridement at this point. No fevers, chills, nausea, or vomiting noted at this time. Patient has a history of hypertension, mild peripheral vascular disease, chronic venous stasis, and other than the ulcer seems to be doing fairly well today. No fevers, chills, nausea, or vomiting noted at this time. 08/23/18 on evaluation today patient actually appears to be doing great in regard to her bilateral lower should be ulcers. She's shown signs of improvement even just with one week with the [employer]. In general I'm actually very pleased with how things appear at this point. 09/06/18 on evaluation today patient actually appears to be doing much better her left lower extremity is completely healed. Go to the right lower extremity lateral malleolus ulcer this still seems to be showing signs of being open.  There does not appear to be evidence of infection which is good news. This did require some sharp debridement however to remove some of the necrotic tissue/eschar on the surface of the wound Readmission 11/27/2021 Ms. Travia Onstad is a 88 year old female with a past medical history of endometrial and breast cancer, chronic venous insufficiency, dementia and essential hypertension that presents to the clinic with a 1 month history of nonhealing ulcer to the left lower extremity. She states that the ulcer opened 1 month ago and then healed but has reopened again in the past week. She is currently keeping the area covered. She has a history of wounds to her lower extremities. She has compression stockings that she uses daily. She currently denies signs of infection. 1/13; patient presents for follow-up. She followed up for nurse visit for wrap change. She reports more odor. She denies  pain. 1/20; patient presents for follow-up. She has been using gentamicin ointment with calcium alginate to the wound bed. She reports improvement in odor and drainage. She currently denies systemic signs of infection. 1/27; patient presents for follow-up. She has been using gentamicin ointment with calcium alginate. She received her Keystone antibiotic 2 days ago. She brought this in. Home health has also been established. They will come out for the first time next week. She currently denies signs of infection. She has no issues or complaints today. 2/2; patient presents for follow-up. She has been using Keystone antibiotics with [facility] with no issues. She denies signs of infection. She has no issues or complaints today. 2/9; patient presents for follow-up. She has been using Keystone antibiotics with [facility] with no issues. She has tolerated the compression wrap well. She denies signs of infection. 2/16; patient presents for follow-up. She has been using Keystone antibiotics with Hydrofera Blue under  compression wrap with no issues. She currently denies signs of infection. 2/23; patient presents for follow-up. She continues to use Keystone antibiotics with Hydrofera Blue under the compression wrap. She has no issues or complaints today. She denies signs of infection. Home health continues to come out and change the wrap. 3/2; patient presents for follow-up. She continues to use [facility] with [facility] under compression with no issues. She denies signs of infection. 3/16; patient presents for follow-up. She continues to use [facility] with [facility] under compression with no issues. Home health change the dressing last week. She denies signs of infection. 3/23; patient presents for follow-up. She continues to use Keystone antibiotics with Hydrofera Blue under compression with no issues. Home health continues to change the dressings. She denies signs of infection. 3/30; patient presents for follow-up. She continues to use Keystone antibiotics with Hydrofera Blue under compression with no issues. Home health continues to change the dressings. 4/13 2-week follow-up. Keystone and Hydrofera Blue wound is roughly 2.3 cm in diameter smaller. No other issues 4/20; patient presents for follow-up. She continues to use Keystone antibiotic with [facility] with no issues. 4/28; patient with a difficult wound on her right medial lower leg. Using [facility] Blue minimal improvement per our intake nurse we are using compression. She has home health changing the dressing 5/5; patient presents for follow-up. We have been using Keystone antibiotics and Hydrofera Blue under compression therapy. She has no issues or complaints today. We discussed potentially using an advanced tissue product. She states she has had this done in the past and would like to proceed to see what her insurance would cover. 5/15; patient presents for follow-up. She has been using Keystone antibiotics and Hydrofera  Blue under compression therapy. She forgot her Keystone antibiotics today. Insurance has not approved her for skin substitute. She currently denies signs of infection. She has home health that changes the dressing once a week. 5/19; patient presents for follow-up. We have been using Keystone antibiotic and Hydrofera Blue under compression therapy. She has no issues or complaints today. The [employer] rep confirmed over the phone that the patient has been 100% approved for their skin substitute. We will verify this as we have not received any paperwork stating this. Patient states she would like to proceed with a skin substitute if it is covered by insurance. 6/1; patient presents for follow-up. We have been using Keystone antibiotic and Hydrofera Blue under compression therapy. We confirmed that she does have a co-pay for the Kerecis skin substitute. We will hold  off on using this for now since she continues to show improvement with current therapy. She denies signs of infection. She has home health that comes out twice weekly. Patient History Information obtained from Patient. Family History Cancer - Child, Hypertension - Child,Father, Kidney Disease - Child, Stroke - Siblings,Child, Thyroid Problems - Child, No family history of Diabetes, Heart Disease, Hereditary Spherocytosis, Lung Disease, Seizures, Tuberculosis. Social History Never smoker, Marital Status - Widowed, Alcohol Use - Never, Drug Use - No History, Caffeine Use - Never. Medical History Eyes Denies history of Cataracts, Glaucoma, Optic Neuritis Ear/Nose/Mouth/Throat Denies history of Chronic sinus problems/congestion, Middle ear problems Hematologic/Lymphatic Patient has history of Anemia Denies history of Hemophilia, Human Immunodeficiency Virus, Lymphedema, Sickle Cell Disease Respiratory Denies history of Aspiration, Asthma, Chronic Obstructive Pulmonary Disease (COPD), Pneumothorax, Sleep Apnea,  Tuberculosis Cardiovascular Patient has history of Congestive Heart Failure, Hypertension, Peripheral Venous Disease Denies history of Angina, Arrhythmia, Coronary Artery Disease, Hypotension, Myocardial Infarction, Peripheral Arterial Disease, Phlebitis, Vasculitis Gastrointestinal Denies history of Cirrhosis , Colitis, Crohnoos, Hepatitis A, Hepatitis B, Hepatitis C Endocrine Denies history of Type I Diabetes, Type II Diabetes Genitourinary Denies history of End Stage Renal Disease Immunological Denies history of Lupus Erythematosus, Raynaudoos, Scleroderma Integumentary (Skin) Denies history of History of Burn Musculoskeletal Patient has history of Osteoarthritis Denies history of Gout, Rheumatoid Arthritis, Osteomyelitis Neurologic Denies history of Neuropathy, Quadriplegia, Paraplegia, Seizure Disorder Oncologic Patient has history of Received Chemotherapy - Breast and Ovarian Cancer Denies history of Received Radiation Hospitalization/Surgery History - diverticulits. Objective Constitutional respirations regular, non-labored and within target range for patient.. Vitals Time Taken: 10:05 AM, Height: 66 in, Weight: 176 lbs, BMI: 28.4, Temperature: 97.8 F, Pulse: 57 bpm, Respiratory Rate: 17 breaths/min, Blood Pressure: 142/65 mmHg. Cardiovascular 2+ dorsalis pedis/posterior tibialis pulses. Psychiatric pleasant and cooperative. General Notes: Left lower extremity: T the lateral aspect there is an open wound with granulation tissue and nonviable tissue present. No surrounding signs of o infection. Integumentary (Hair, Skin) Wound #26 status is Open. Original cause of wound was Gradually Appeared. The date acquired was: 11/20/2021. The wound has been in treatment 20 weeks. The wound is located on the Left,Lateral Ankle. The wound measures 2.4cm length x 1.5cm width x 0.2cm depth; 2.827cm^2 area and 0.565cm^3 volume. There is Fat Layer (Subcutaneous Tissue) exposed. There  is no tunneling or undermining noted. There is a medium amount of serosanguineous drainage noted. The wound margin is distinct with the outline attached to the wound base. There is large (67-100%) red granulation within the wound bed. There is a small (1-33%) amount of necrotic tissue within the wound bed including Adherent Slough. Assessment Active Problems ICD-10 Chronic venous hypertension (idiopathic) with ulcer of left lower extremity Non-pressure chronic ulcer of other part of left lower leg with fat layer exposed Malignant neoplasm of unspecified site of unspecified female breast Chronic combined systolic (congestive) and diastolic (congestive) heart failure Alzheimer's disease, unspecified Malignant neoplasm of endometrium Patient has shown improvement in appearance since last clinic visit. The size is stable. I debrided nonviable tissue. We will continue with Hydrofera Blue and Keystone antibiotic under compression therapy. No signs of surrounding infection. She has home health and would like to follow-up in 2 weeks. At that time she may benefit from a dressing change to something like collagen or endoform. Procedures Wound #26 Pre-procedure diagnosis of Wound #26 is a Venous Leg Ulcer located on the Left,Lateral Ankle .Severity of Tissue Pre Debridement is: Fat layer exposed. There was a Excisional Skin/Subcutaneous Tissue Debridement  with a total area of 3.6 sq cm performed by [name] Shan, DO. With the following instrument(s): Curette to remove Viable and Non-Viable tissue/material. Material removed includes Subcutaneous Tissue and Slough and after achieving pain control using Lidocaine. No specimens were taken. A time out was conducted at 10:30, prior to the start of the procedure. A Minimum amount of bleeding was controlled with Pressure. The procedure was tolerated well with a pain level of 0 throughout and a pain level of 0 following the procedure. Post  Debridement Measurements: 2.4cm length x 1.5cm width x 0.2cm depth; 0.565cm^3 volume. Character of Wound/Ulcer Post Debridement is improved. Severity of Tissue Post Debridement is: Fat layer exposed. Post procedure Diagnosis Wound #26: Same as Pre-Procedure Pre-procedure diagnosis of Wound #26 is a Venous Leg Ulcer located on the Left,Lateral Ankle . There was a Three Layer Compression Therapy Procedure by Rhae Hammock, RN. Post procedure Diagnosis Wound #26: Same as Pre-Procedure Plan Follow-up Appointments: Return Appointment in 2 weeks. - Dr. Heber Gregory and Allayne Butcher Room # 9 ***Continue to bring keystone topical antibiotic medication to each visit.*** Bathing/ Shower/ Hygiene: May shower with protection but do not get wound dressing(s) wet. - use cast protector. May shower and wash wound with soap and water. - with dressing changes may wash with soap and water. Edema Control - Lymphedema / SCD / Other: Elevate legs to the level of the heart or above for 30 minutes daily and/or when sitting, a frequency of: Avoid standing for long periods of time. Patient to wear own compression stockings every day. - apply in the morning and remove at night to right leg. Exercise regularly Moisturize legs daily. - every night before day right leg. Additional Orders / Instructions: Follow Nutritious Diet - High Protein Diet Home Health: No change in wound care orders this week; continue Home Health for wound care. May utilize formulary equivalent dressing for wound treatment orders unless otherwise specified. - ****FOR WEEK OF 04/26/22 HOME HEALTH TO GO OUT TWICE TO CHANGE WRAP**** Home Health for skilled wound care nursing once a week and wound center weekly. Apply keystone topical antibiotics and hydrofera blue classic as primary dressing. NO ZINC OXIDE!! Other Home Health Orders/Instructions: [facility] home health WOUND #26: - Ankle Wound Laterality: Left, Lateral Cleanser: Soap and Water (Home  Health) 2 x Per Week/30 Days Discharge Instructions: May shower and wash wound with dial antibacterial soap and water prior to dressing change. Peri-Wound Care: Sween Lotion (Moisturizing lotion) (Home Health) 2 x Per Week/30 Days Discharge Instructions: Apply moisturizing lotion as directed Topical: Keystone topical Antibiotics (Home Health) 2 x Per Week/30 Days Discharge Instructions: Mix medication according to pharmacy instruction apply to wound bed. Prim Dressing: Hydrofera Blue Classic Foam, 4x4 in (Home Health) 2 x Per Week/30 Days ary Discharge Instructions: Moisten with saline apply over the [facility] medication. Secondary Dressing: Zetuvit Plus 4x8 in [facility]) 2 x Per Week/30 Days Discharge Instructions: Apply over primary dressing as directed. Com pression Wrap: ThreePress (3 layer compression wrap) (Home Health) 2 x Per Week/30 Days Discharge Instructions: Apply three layer compression as directed. 1. In office sharp debridement 2. Hydrofera Blue and Keystone antibiotic under 3 layer compression 3. Follow-up in 2 weeks Electronic Signature(s) Signed: 04/22/2022 1:29:37 PM By: [name] Shan DO Entered By: [name] on 04/22/2022 12:03:00 -------------------------------------------------------------------------------- HxROS Details Patient Name: Date of Service: [name], [name] 04/22/2022 10:00 A M Medical Record Number: [id number] Patient Account Number: [id number] Date of Birth/Sex: Treating RN: 03-Jun-1925 (88 y.o. [name]  Primary Care Provider: Marda [name] Other Clinician: Referring Provider: Treating Provider/Extender: [name] Signs in Treatment: 20 Information Obtained From Patient Eyes Medical History: Negative for: Cataracts; Glaucoma; Optic Neuritis Ear/Nose/Mouth/Throat Medical History: Negative for: Chronic sinus problems/congestion; Middle ear problems Hematologic/Lymphatic Medical  History: Positive for: Anemia Negative for: Hemophilia; Human Immunodeficiency Virus; Lymphedema; Sickle Cell Disease Respiratory Medical History: Negative for: Aspiration; Asthma; Chronic Obstructive Pulmonary Disease (COPD); Pneumothorax; Sleep Apnea; Tuberculosis Cardiovascular Medical History: Positive for: Congestive Heart Failure; Hypertension; Peripheral Venous Disease Negative for: Angina; Arrhythmia; Coronary Artery Disease; Hypotension; Myocardial Infarction; Peripheral Arterial Disease; Phlebitis; Vasculitis Gastrointestinal Medical History: Negative for: Cirrhosis ; Colitis; Crohns; Hepatitis A; Hepatitis B; Hepatitis C Endocrine Medical History: Negative for: Type I Diabetes; Type II Diabetes Genitourinary Medical History: Negative for: End Stage Renal Disease Immunological Medical History: Negative for: Lupus Erythematosus; Raynauds; Scleroderma Integumentary (Skin) Medical History: Negative for: History of Burn Musculoskeletal Medical History: Positive for: Osteoarthritis Negative for: Gout; Rheumatoid Arthritis; Osteomyelitis Neurologic Medical History: Negative for: Neuropathy; Quadriplegia; Paraplegia; Seizure Disorder Oncologic Medical History: Positive for: Received Chemotherapy - Breast and Ovarian Cancer Negative for: Received Radiation Immunizations Pneumococcal Vaccine: Received Pneumococcal Vaccination: Yes Received Pneumococcal Vaccination On or After 60th Birthday: Yes Implantable Devices None Hospitalization / Surgery History Type of Hospitalization/Surgery diverticulits Family and Social History Cancer: Yes - Child; Diabetes: No; Heart Disease: No; Hereditary Spherocytosis: No; Hypertension: Yes - Child,Father; Kidney Disease: Yes - Child; Lung Disease: No; Seizures: No; Stroke: Yes - Siblings,Child; Thyroid Problems: Yes - Child; Tuberculosis: No; Never smoker; Marital Status - Widowed; Alcohol Use: Never; Drug Use: No History; Caffeine  Use: Never; Financial Concerns: No; Food, Clothing or Shelter Needs: No; Support System Lacking: No; Transportation Concerns: No Electronic Signature(s) Signed: 04/22/2022 1:29:37 PM By: [name] Shan DO Signed: 04/23/2022 11:55:08 AM By: [name] Entered By: [name] on 04/22/2022 12:01:36 -------------------------------------------------------------------------------- SuperBill Details Patient Name: Date of Service: [name], [name] 04/22/2022 Medical Record Number: [id number] Patient Account Number: [id number] Date of Birth/Sex: Treating RN: 1925/10/23 (88 y.o. [name] Primary Care Provider: Marda [name] Other Clinician: Referring Provider: Treating Provider/Extender: Marjo Bicker Weeks in Treatment: 20 Diagnosis Coding ICD-10 Codes Code Description [phone] Chronic venous hypertension (idiopathic) with ulcer of left lower extremity L97.822 Non-pressure chronic ulcer of other part of left lower leg with fat layer exposed C50.919 Malignant neoplasm of unspecified site of unspecified female breast I50.42 Chronic combined systolic (congestive) and diastolic (congestive) heart failure G30.9 Alzheimer's disease, unspecified C54.1 Malignant neoplasm of endometrium Facility Procedures CPT4 Code: [id number] Description: 48185 - DEB SUBQ TISSUE 20 SQ CM/< ICD-10 Diagnosis Description L97.822 Non-pressure chronic ulcer of other part of left lower leg with fat layer expo Modifier: sed Quantity: 1 Physician Procedures : CPT4 Code Description Modifier [id number] - WC PHYS SUBQ TISS 20 SQ CM ICD-10 Diagnosis Description L97.822 Non-pressure chronic ulcer of other part of left lower leg with fat layer exposed Quantity: 1 Electronic Signature(s) Signed: 04/22/2022 1:29:37 PM By: [name] Shan DO Entered By: [name] on 04/22/2022 12:03:08

## 2022-04-23 NOTE — Progress Notes (Signed)
[name], [name] ([id number]) Visit Report for 04/22/2022 Arrival Information Details Patient Name: Date of Service: [name], [name] 04/22/2022 10:00 A M Medical Record Number: [id number] Patient Account Number: [id number] Date of Birth/Sex: Treating RN: 10-18-1925 (88 y.o. [name], [name] Primary Care Raushanah Osmundson: Marda Stalker Other Clinician: Referring Keyna Blizard: Treating Marta Bouie/Extender: Aviva Signs in Treatment: 20 Visit Information History Since Last Visit Added or deleted any medications: No Patient Arrived: Wheel Chair Any new allergies or adverse reactions: No Arrival Time: 10:05 Had a fall or experienced change in No Accompanied By: daughter activities of daily living that may affect Transfer Assistance: Manual risk of falls: Patient Identification Verified: Yes Signs or symptoms of abuse/neglect since last visito No Secondary Verification Process Completed: Yes Hospitalized since last visit: No Patient Requires Transmission-Based Precautions: No Implantable device outside of the clinic excluding No Patient Has Alerts: No cellular tissue based products placed in the center since last visit: Has Dressing in Place as Prescribed: Yes Has Compression in Place as Prescribed: Yes Pain Present Now: No Electronic Signature(s) Signed: 04/23/2022 11:55:08 AM By: [name] Entered By: Rhae Hammock on 04/22/2022 10:05:29 -------------------------------------------------------------------------------- Compression Therapy Details Patient Name: Date of Service: [name], TETRAULT [name] 04/22/2022 10:00 A M Medical Record Number: [id number] Patient Account Number: [id number] Date of Birth/Sex: Treating RN: 06-08-1925 (88 y.o. [name], [name] Primary Care Wenda Vanschaick: Marda Stalker Other Clinician: Referring Karizma Cheek: Treating Malaquias Lenker/Extender: Marjo Bicker Weeks in Treatment: 20 Compression Therapy  Performed for Wound Assessment: Wound #26 Left,Lateral Ankle Performed By: Clinician Rhae Hammock, RN Compression Type: Three Layer Post Procedure Diagnosis Same as Pre-procedure Electronic Signature(s) Signed: 04/23/2022 11:55:08 AM By: [name] Entered By: Rhae Hammock on 04/22/2022 10:33:10 -------------------------------------------------------------------------------- Encounter Discharge Information Details Patient Name: Date of Service: [name], TODISCO [name] 04/22/2022 10:00 [place] Record Number: [id number] Patient Account Number: [id number] Date of Birth/Sex: Treating RN: 1925/10/06 (88 y.o. [name], [name] Primary Care Gevon Markus: Marda Stalker Other Clinician: Referring Cahterine Heinzel: Treating Shelisha Gautier/Extender: Aviva Signs in Treatment: 20 Encounter Discharge Information Items Discharge Condition: Stable Ambulatory Status: Ambulatory Discharge Destination: Home Transportation: Private Auto Accompanied By: self Schedule Follow-up Appointment: Yes Clinical Summary of Care: Patient Declined Electronic Signature(s) Signed: 04/23/2022 11:55:08 AM By: [name] Entered By: Rhae Hammock on 04/22/2022 10:25:28 -------------------------------------------------------------------------------- Lower Extremity Assessment Details Patient Name: Date of Service: [name], RICK [name] 04/22/2022 10:00 [place] Record Number: [id number] Patient Account Number: [id number] Date of Birth/Sex: Treating RN: 1925/06/24 (88 y.o. [name], [name] Primary Care Walida Cajas: Marda Stalker Other Clinician: Referring Johnathan Tortorelli: Treating Square Jowett/Extender: Marjo Bicker Weeks in Treatment: 20 Edema Assessment Assessed: [name]: Yes] Patrice Paradise: No] Edema: [Left: N] [Right: o] Calf Left: Right: Point of Measurement: 30 cm From Medial Instep 31.4 cm Ankle Left: Right: Point of Measurement: 11 cm From Medial  Instep 20.5 cm Vascular Assessment Pulses: Dorsalis Pedis Palpable: [Left:Yes] Posterior Tibial Palpable: [Left:Yes] Electronic Signature(s) Signed: 04/23/2022 11:55:08 AM By: [name] Entered By: Rhae Hammock on 04/22/2022 10:06:03 -------------------------------------------------------------------------------- Multi Wound Chart Details Patient Name: Date of Service: [name], MELKA [name] 04/22/2022 10:00 A M Medical Record Number: [id number] Patient Account Number: [id number] Date of Birth/Sex: Treating RN: [birth year] (88 y.o. [name], [name] Primary Care Sekai Gitlin: Marda Stalker Other Clinician: Referring [place] Cantero: Treating Julius Matus/Extender: Marjo Bicker Weeks in Treatment: 20 Vital Signs Height(in): 66 Pulse(bpm): 37 Weight(lbs): 176 Blood Pressure(mmHg): 142/65 Body Mass Index(BMI): 28.4 Temperature(F): 97.8 Respiratory Rate(breaths/min): 17 Photos: [N/A:N/A] Left, Lateral Ankle N/A N/A Wound  Location: Gradually Appeared N/A N/A Wounding Event: Venous Leg Ulcer N/A N/A Primary Etiology: Anemia, Congestive Heart Failure, N/A N/A Comorbid History: Hypertension, Peripheral Venous Disease, Osteoarthritis, Received Chemotherapy 11/20/2021 N/A N/A Date Acquired: 20 N/A N/A Weeks of Treatment: Open N/A N/A Wound Status: No N/A N/A Wound Recurrence: 2.4x1.5x0.2 N/A N/A Measurements L x W x D (cm) 2.827 N/A N/A A (cm) : rea 0.565 N/A N/A Volume (cm) : 70.00% N/A N/A % Reduction in A rea: 40.00% N/A N/A % Reduction in Volume: Full Thickness Without Exposed N/A N/A Classification: Support Structures Medium N/A N/A Exudate A mount: Serosanguineous N/A N/A Exudate Type: red, brown N/A N/A Exudate Color: Distinct, outline attached N/A N/A Wound Margin: Large (67-100%) N/A N/A Granulation A mount: Red N/A N/A Granulation Quality: Small (1-33%) N/A N/A Necrotic A mount: Fat Layer (Subcutaneous Tissue):  Yes N/A N/A Exposed Structures: Fascia: No Tendon: No Muscle: No Joint: No Bone: No Small (1-33%) N/A N/A Epithelialization: Debridement - Excisional N/A N/A Debridement: Pre-procedure Verification/Time Out 10:30 N/A N/A Taken: Lidocaine N/A N/A Pain Control: Subcutaneous, Slough N/A N/A Tissue Debrided: Skin/Subcutaneous Tissue N/A N/A Level: 3.6 N/A N/A Debridement A (sq cm): rea Curette N/A N/A Instrument: Minimum N/A N/A Bleeding: Pressure N/A N/A Hemostasis A chieved: 0 N/A N/A Procedural Pain: 0 N/A N/A Post Procedural Pain: Procedure was tolerated well N/A N/A Debridement Treatment Response: 2.4x1.5x0.2 N/A N/A Post Debridement Measurements L x W x D (cm) 0.565 N/A N/A Post Debridement Volume: (cm) Compression Therapy N/A N/A Procedures Performed: Debridement Treatment Notes Wound #26 (Ankle) Wound Laterality: Left, Lateral Cleanser Soap and Water Discharge Instruction: May shower and wash wound with dial antibacterial soap and water prior to dressing change. Peri-Wound Care Sween Lotion (Moisturizing lotion) Discharge Instruction: Apply moisturizing lotion as directed Topical Keystone topical Antibiotics Discharge Instruction: Mix medication according to pharmacy instruction apply to wound bed. Primary Dressing Hydrofera Blue Classic Foam, 4x4 in Discharge Instruction: Moisten with saline apply over the [facility] medication. Secondary Dressing Zetuvit Plus 4x8 in Discharge Instruction: Apply over primary dressing as directed. Secured With Compression Wrap ThreePress (3 layer compression wrap) Discharge Instruction: Apply three layer compression as directed. Compression Stockings Add-Ons Electronic Signature(s) Signed: 04/22/2022 1:29:37 PM By: Kalman Shan DO Signed: 04/23/2022 11:55:08 AM By: [name] Entered By: Kalman Shan on 04/22/2022  11:56:15 -------------------------------------------------------------------------------- Multi-Disciplinary Care Plan Details Patient Name: Date of Service: ETHELMAE, RINGEL [name] 04/22/2022 10:00 A M Medical Record Number: [id number] Patient Account Number: [id number] Date of Birth/Sex: Treating RN: 11-06-1925 (88 y.o. [name], [name] Primary Care Chamari Cutbirth: Marda Stalker Other Clinician: Referring Keyosha Tiedt: Treating Kelsy Polack/Extender: Aviva Signs in Treatment: 20 Active Inactive Nutrition Nursing Diagnoses: Imbalanced nutrition Goals: Patient/caregiver agrees to and verbalizes understanding of need to use nutritional supplements and/or vitamins as prescribed Date Initiated: 11/27/2021 Target Resolution Date: 04/24/2022 Goal Status: Active Interventions: Assess patient nutrition upon admission and as needed per policy Provide education on nutrition Treatment Activities: Education provided on Nutrition : 03/04/2022 Notes: Wound/Skin Impairment Nursing Diagnoses: Impaired tissue integrity Goals: Patient/caregiver will verbalize understanding of skin care regimen Date Initiated: 11/27/2021 Target Resolution Date: 04/24/2022 Goal Status: Active Ulcer/skin breakdown will have a volume reduction of 30% by week 4 Date Initiated: 11/27/2021 Date Inactivated: 12/31/2021 Target Resolution Date: 12/25/2021 Unmet Reason: according to Goal Status: Unmet measurements has not reduced. Interventions: Assess patient/caregiver ability to obtain necessary supplies Assess patient/caregiver ability to perform ulcer/skin care regimen upon admission and as needed Assess ulceration(s) every visit Provide education on ulcer  and skin care Treatment Activities: Topical wound management initiated : 11/27/2021 Notes: Electronic Signature(s) Signed: 04/23/2022 11:55:08 AM By: [name] Entered By: Rhae Hammock on 04/22/2022  10:19:50 -------------------------------------------------------------------------------- Pain Assessment Details Patient Name: Date of Service: AAVYA, SHAFER [name] 04/22/2022 10:00 [place] Record Number: [id number] Patient Account Number: [id number] Date of Birth/Sex: Treating RN: 06-Jan-1925 (87 y.o. [name], [name] Primary Care Giankarlo Leamer: Marda Stalker Other Clinician: Referring Orla Jolliff: Treating Nguyen Todorov/Extender: Marjo Bicker Weeks in Treatment: 20 Active Problems Location of Pain Severity and Description of Pain Patient Has Paino No Site Locations Pain Management and Medication Current Pain Management: Electronic Signature(s) Signed: 04/23/2022 11:55:08 AM By: [name] Entered By: Rhae Hammock on 04/22/2022 10:05:56 -------------------------------------------------------------------------------- Patient/Caregiver Education Details Patient Name: Date of Service: Thalia Party [name] 6/1/2023andnbsp10:00 A M Medical Record Number: [id number] Patient Account Number: [id number] Date of Birth/Gender: Treating RN: Aug 09, 1925 (88 y.o. [name], [name] Primary Care Physician: Marda Stalker Other Clinician: Referring Physician: Treating Physician/Extender: Aviva Signs in Treatment: 20 Education Assessment Education Provided To: Patient Education Topics Provided Nutrition: Methods: Explain/Verbal Responses: Reinforcements needed, State content correctly Wound/Skin Impairment: Methods: Explain/Verbal Responses: Reinforcements needed, State content correctly Electronic Signature(s) Signed: 04/23/2022 11:55:08 AM By: [name] Entered By: Rhae Hammock on 04/22/2022 10:20:05 -------------------------------------------------------------------------------- Wound Assessment Details Patient Name: Date of Service: CHLOE, MIYOSHI [name] 04/22/2022 10:00 [place] Record Number:  [id number] Patient Account Number: [id number] Date of Birth/Sex: Treating RN: 1925/11/14 (88 y.o. [name], [name] Primary Care Miguel Christiana: Marda Stalker Other Clinician: Referring Deshea Pooley: Treating Payslie Mccaig/Extender: Marjo Bicker Weeks in Treatment: 20 Wound Status Wound Number: 26 Primary Venous Leg Ulcer Etiology: Wound Location: Left, Lateral Ankle Wound Open Wounding Event: Gradually Appeared Status: Date Acquired: 11/20/2021 Comorbid Anemia, Congestive Heart Failure, Hypertension, Peripheral Weeks Of Treatment: 20 History: Venous Disease, Osteoarthritis, Received Chemotherapy Clustered Wound: No Photos Wound Measurements Length: (cm) 2.4 Width: (cm) 1.5 Depth: (cm) 0.2 Area: (cm) 2.827 Volume: (cm) 0.565 % Reduction in Area: 70% % Reduction in Volume: 40% Epithelialization: Small (1-33%) Tunneling: No Undermining: No Wound Description Classification: Full Thickness Without Exposed Support Structures Wound Margin: Distinct, outline attached Exudate Amount: Medium Exudate Type: Serosanguineous Exudate Color: red, brown Foul Odor After Cleansing: No Slough/Fibrino Yes Wound Bed Granulation Amount: Large (67-100%) Exposed Structure Granulation Quality: Red Fascia Exposed: No Necrotic Amount: Small (1-33%) Fat Layer (Subcutaneous Tissue) Exposed: Yes Necrotic Quality: Adherent Slough Tendon Exposed: No Muscle Exposed: No Joint Exposed: No Bone Exposed: No Treatment Notes Wound #26 (Ankle) Wound Laterality: Left, Lateral Cleanser Soap and Water Discharge Instruction: May shower and wash wound with dial antibacterial soap and water prior to dressing change. Peri-Wound Care Sween Lotion (Moisturizing lotion) Discharge Instruction: Apply moisturizing lotion as directed Topical Keystone topical Antibiotics Discharge Instruction: Mix medication according to pharmacy instruction apply to wound bed. Primary Dressing Hydrofera Blue  Classic Foam, 4x4 in Discharge Instruction: Moisten with saline apply over the [facility] medication. Secondary Dressing Zetuvit Plus 4x8 in Discharge Instruction: Apply over primary dressing as directed. Secured With Compression Wrap ThreePress (3 layer compression wrap) Discharge Instruction: Apply three layer compression as directed. Compression Stockings Add-Ons Electronic Signature(s) Signed: 04/23/2022 11:55:08 AM By: [name] Entered By: Rhae Hammock on 04/22/2022 10:14:09 -------------------------------------------------------------------------------- Vitals Details Patient Name: Date of Service: DULCINEA, KINSER [name] 04/22/2022 10:00 [place] Record Number: [id number] Patient Account Number: [id number] Date of Birth/Sex: Treating RN: 1925/01/25 (88 y.o. [name], [name] Primary Care Jalesia Loudenslager: Marda Stalker Other Clinician: Referring Dessire Grimes:  Treating Avanti Jetter/Extender: Marjo Bicker Weeks in Treatment: 20 Vital Signs Time Taken: 10:05 Temperature (F): 97.8 Height (in): 66 Pulse (bpm): 57 Weight (lbs): 176 Respiratory Rate (breaths/min): 17 Body Mass Index (BMI): 28.4 Blood Pressure (mmHg): 142/65 Reference Range: 80 - 120 mg / dl Electronic Signature(s) Signed: 04/23/2022 11:55:08 AM By: [name] Entered By: Rhae Hammock on 04/22/2022 10:05:50

## 2022-05-04 NOTE — Progress Notes (Signed)
[name], [name] ([id number]) Visit Report for 04/02/2022 Arrival Information Details Patient Name: Date of Service: [name], MCCOSH 04/02/2022 10:45 A M Medical Record Number: [id number] Patient Account Number: [id number] Date of Birth/Sex: Treating RN: 1925-03-09 (88 y.o. [name], [name] Primary Care Essam Lowdermilk: Marda Stalker Other Clinician: Referring Koleen Celia: Treating Season Astacio/Extender: Aviva Signs in Treatment: 18 Visit Information History Since Last Visit Added or deleted any medications: No Patient Arrived: Wheel Chair Any new allergies or adverse reactions: No Arrival Time: 11:14 Had a fall or experienced change in No Accompanied By: daughter activities of daily living that may affect Transfer Assistance: Manual risk of falls: Patient Identification Verified: Yes Signs or symptoms of abuse/neglect since last visito No Secondary Verification Process Completed: Yes Hospitalized since last visit: No Patient Requires Transmission-Based Precautions: No Implantable device outside of the clinic excluding No Patient Has Alerts: No cellular tissue based products placed in the center since last visit: Has Dressing in Place as Prescribed: Yes Pain Present Now: No Electronic Signature(s) Signed: 05/04/2022 8:46:54 AM By: Erenest Blank Entered By: Erenest Blank on 04/02/2022 11:15:28 -------------------------------------------------------------------------------- Complex / Palliative Patient Assessment Details Patient Name: Date of Service: [name], [place] 04/02/2022 10:45 A M Medical Record Number: [id number] Patient Account Number: [id number] Date of Birth/Sex: Treating RN: 26-May-1925 (88 y.o. [name] Primary Care Mataya Kilduff: Marda Stalker Other Clinician: Referring Maia Handa: Treating Meribeth Vitug/Extender: Aviva Signs in Treatment: 18 Complex Wound Management Criteria Patient has remarkable or  complex co-morbidities requiring medications or treatments that extend wound healing times. Examples: Diabetes mellitus with chronic renal failure or end stage renal disease requiring dialysis Advanced or poorly controlled rheumatoid arthritis Diabetes mellitus and end stage chronic obstructive pulmonary disease Active cancer with current chemo- or radiation therapy Anemia, osteoarthritis, CHF, HTN, PVD, history of breast and ovarian Ca. Palliative Wound Management Criteria Care Approach Wound Care Plan: Complex Wound Management Electronic Signature(s) Signed: 04/02/2022 2:06:30 PM By: Deon Pilling RN, BSN Signed: 04/05/2022 2:13:55 PM By: Kalman Shan DO Entered By: Deon Pilling on 04/02/2022 14:06:29 -------------------------------------------------------------------------------- Compression Therapy Details Patient Name: Date of Service: [name], MITCHNER RNESTINE 04/02/2022 10:45 A M Medical Record Number: [id number] Patient Account Number: [id number] Date of Birth/Sex: Treating RN: 10-28-1925 (88 y.o. [name], [name] Primary Care Marasia Newhall: Marda Stalker Other Clinician: Referring Jaheem Hedgepath: Treating Garris Melhorn/Extender: Aviva Signs in Treatment: 18 Compression Therapy Performed for Wound Assessment: Wound #26 Left,Lateral Ankle Performed By: Clinician Rhae Hammock, RN Compression Type: Three Layer Post Procedure Diagnosis Same as Pre-procedure Electronic Signature(s) Signed: 04/02/2022 2:51:54 PM By: [name] Entered By: Rhae Hammock on 04/02/2022 12:06:46 -------------------------------------------------------------------------------- Encounter Discharge Information Details Patient Name: Date of Service: [name] 04/02/2022 10:45 A M Medical Record Number: [id number] Patient Account Number: [id number] Date of Birth/Sex: Treating RN: [birth year] (88 y.o. [name], [name] Primary Care Brena Windsor: Marda Stalker Other Clinician: Referring [place] Lebeau: Treating Syretta Kochel/Extender: Aviva Signs in Treatment: 18 Encounter Discharge Information Items Discharge Condition: Stable Ambulatory Status: Wheelchair Discharge Destination: Home Transportation: Private Auto Accompanied By: daughter Schedule Follow-up Appointment: Yes Clinical Summary of Care: Patient Declined Electronic Signature(s) Signed: 04/02/2022 2:51:54 PM By: [name] Entered By: Rhae Hammock on 04/02/2022 12:10:14 -------------------------------------------------------------------------------- Lower Extremity Assessment Details Patient Name: Date of Service: [name], [name] RNESTINE 04/02/2022 10:45 A M Medical Record Number: [id number] Patient Account Number: [id number] Date of Birth/Sex: Treating RN: 1925-04-29 (88 y.o. [name], [name] Primary Care Jeriko Kowalke: Marda Stalker Other Clinician: Referring Yeny Schmoll: Treating [place] Yearby/Extender: Leonie Green,  Courtney Weeks in Treatment: 18 Edema Assessment Assessed: [Left: No] [Right: No] Edema: [Left: N] [Right: o] Calf Left: Right: Point of Measurement: 30 cm From Medial Instep 31.4 cm Ankle Left: Right: Point of Measurement: 11 cm From Medial Instep 20.5 cm Vascular Assessment Pulses: Dorsalis Pedis Palpable: [Left:Yes] Electronic Signature(s) Signed: 04/02/2022 2:51:54 PM By: [name] Signed: 05/04/2022 8:46:54 AM By: Erenest Blank Entered By: Erenest Blank on 04/02/2022 11:30:04 -------------------------------------------------------------------------------- Multi Wound Chart Details Patient Name: Date of Service: [name], [name] RNESTINE 04/02/2022 10:45 A M Medical Record Number: [id number] Patient Account Number: [id number] Date of Birth/Sex: Treating RN: 1925-11-19 (88 y.o. [name], [name] Primary Care Rock Sobol: Marda Stalker Other Clinician: Referring Shanessa Hodak: Treating  Nishka Heide/Extender: Aviva Signs in Treatment: 18 Vital Signs Height(in): 35 Pulse(bpm): 69 Weight(lbs): 176 Blood Pressure(mmHg): 148/61 Body Mass Index(BMI): 28.4 Temperature(F): 98.1 Respiratory Rate(breaths/min): 16 Photos: [N/A:N/A] Left, Lateral Ankle N/A N/A Wound Location: Gradually Appeared N/A N/A Wounding Event: Venous Leg Ulcer N/A N/A Primary Etiology: Anemia, Congestive Heart Failure, N/A N/A Comorbid History: Hypertension, Peripheral Venous Disease, Osteoarthritis, Received Chemotherapy 11/20/2021 N/A N/A Date Acquired: 80 N/A N/A Weeks of Treatment: Open N/A N/A Wound Status: No N/A N/A Wound Recurrence: 2.4x1.9x0.3 N/A N/A Measurements L x W x D (cm) 3.581 N/A N/A A (cm) : rea 1.074 N/A N/A Volume (cm) : 62.00% N/A N/A % Reduction in Area: -14.00% N/A N/A % Reduction in Volume: Full Thickness Without Exposed N/A N/A Classification: Support Structures Medium N/A N/A Exudate A mount: Serosanguineous N/A N/A Exudate Type: red, brown N/A N/A Exudate Color: Distinct, outline attached N/A N/A Wound Margin: Large (67-100%) N/A N/A Granulation A mount: Red N/A N/A Granulation Quality: Small (1-33%) N/A N/A Necrotic A mount: Fat Layer (Subcutaneous Tissue): Yes N/A N/A Exposed Structures: Fascia: No Tendon: No Muscle: No Joint: No Bone: No Small (1-33%) N/A N/A Epithelialization: Debridement - Excisional N/A N/A Debridement: Pre-procedure Verification/Time Out 12:35 N/A N/A Taken: Lidocaine N/A N/A Pain Control: Subcutaneous, Slough N/A N/A Tissue Debrided: Skin/Subcutaneous Tissue N/A N/A Level: 4.56 N/A N/A Debridement A (sq cm): rea Curette N/A N/A Instrument: Minimum N/A N/A Bleeding: Pressure N/A N/A Hemostasis A chieved: 0 N/A N/A Procedural Pain: 0 N/A N/A Post Procedural Pain: Procedure was tolerated well N/A N/A Debridement Treatment Response: 2.4x1.9x0.3 N/A N/A Post  Debridement Measurements L x W x D (cm) 1.074 N/A N/A Post Debridement Volume: (cm) Compression Therapy N/A N/A Procedures Performed: Debridement Treatment Notes Wound #26 (Ankle) Wound Laterality: Left, Lateral Cleanser Soap and Water Discharge Instruction: May shower and wash wound with dial antibacterial soap and water prior to dressing change. Peri-Wound Care Sween Lotion (Moisturizing lotion) Discharge Instruction: Apply moisturizing lotion as directed Topical Keystone topical Antibiotics Discharge Instruction: Mix medication according to pharmacy instruction apply to wound bed. Primary Dressing Hydrofera Blue Classic Foam, 4x4 in Discharge Instruction: Moisten with saline apply over the [facility] medication. Secondary Dressing Zetuvit Plus 4x8 in Discharge Instruction: Apply over primary dressing as directed. Secured With Compression Wrap ThreePress (3 layer compression wrap) Discharge Instruction: Apply three layer compression as directed. Compression Stockings Add-Ons Electronic Signature(s) Signed: 04/05/2022 2:13:55 PM By: Kalman Shan DO Signed: 04/06/2022 4:47:11 PM By: [name] Entered By: Kalman Shan on 04/05/2022 13:02:04 -------------------------------------------------------------------------------- Multi-Disciplinary Care Plan Details Patient Name: Date of Service: [place], [place] 04/02/2022 10:45 A M Medical Record Number: [id number] Patient Account Number: [id number] Date of Birth/Sex: Treating RN: 03/02/1925 (88 y.o. [name], [name] Primary Care Evva Din: Marda Stalker Other Clinician: Referring Jaylenne Hamelin:  Treating Rolin Schult/Extender: Marjo Bicker Weeks in Treatment: 18 Active Inactive Nutrition Nursing Diagnoses: Imbalanced nutrition Goals: Patient/caregiver agrees to and verbalizes understanding of need to use nutritional supplements and/or vitamins as prescribed Date Initiated:  11/27/2021 Target Resolution Date: 04/24/2022 Goal Status: Active Interventions: Assess patient nutrition upon admission and as needed per policy Provide education on nutrition Treatment Activities: Education provided on Nutrition : 03/04/2022 Notes: Wound/Skin Impairment Nursing Diagnoses: Impaired tissue integrity Goals: Patient/caregiver will verbalize understanding of skin care regimen Date Initiated: 11/27/2021 Target Resolution Date: 04/24/2022 Goal Status: Active Ulcer/skin breakdown will have a volume reduction of 30% by week 4 Date Initiated: 11/27/2021 Date Inactivated: 12/31/2021 Target Resolution Date: 12/25/2021 Unmet Reason: according to Goal Status: Unmet measurements has not reduced. Interventions: Assess patient/caregiver ability to obtain necessary supplies Assess patient/caregiver ability to perform ulcer/skin care regimen upon admission and as needed Assess ulceration(s) every visit Provide education on ulcer and skin care Treatment Activities: Topical wound management initiated : 11/27/2021 Notes: Electronic Signature(s) Signed: 04/02/2022 2:51:54 PM By: [name] Entered By: Rhae Hammock on 04/02/2022 11:55:45 -------------------------------------------------------------------------------- Pain Assessment Details Patient Name: Date of Service: ROSHAWNDA, [name] RNESTINE 04/02/2022 10:45 A M Medical Record Number: [id number] Patient Account Number: [id number] Date of Birth/Sex: Treating RN: 12/06/1924 (88 y.o. [name], [name] Primary Care Nadalyn Deringer: Marda Stalker Other Clinician: Referring Kacee Koren: Treating Imoni Kohen/Extender: Aviva Signs in Treatment: 18 Active Problems Location of Pain Severity and Description of Pain Patient Has Paino No Site Locations Pain Management and Medication Current Pain Management: Electronic Signature(s) Signed: 04/02/2022 2:51:54 PM By: [name] Signed: 05/04/2022 8:46:54 AM  By: Erenest Blank Entered By: Erenest Blank on 04/02/2022 11:15:57 -------------------------------------------------------------------------------- Patient/Caregiver Education Details Patient Name: Date of Service: [name] Party RNESTINE 5/12/2023andnbsp10:45 [place] Record Number: [id number] Patient Account Number: [id number] Date of Birth/Gender: Treating RN: [birth year] (88 y.o. [name], [name] Primary Care Physician: Marda Stalker Other Clinician: Referring Physician: Treating Physician/Extender: Aviva Signs in Treatment: 18 Education Assessment Education Provided To: Patient Education Topics Provided Wound/Skin Impairment: Methods: Explain/Verbal Responses: Reinforcements needed, State content correctly Electronic Signature(s) Signed: 04/02/2022 2:51:54 PM By: [name] Entered By: Rhae Hammock on 04/02/2022 12:08:50 -------------------------------------------------------------------------------- Wound Assessment Details Patient Name: Date of Service: [name], [name] RNESTINE 04/02/2022 10:45 A M Medical Record Number: [id number] Patient Account Number: [id number] Date of Birth/Sex: Treating RN: 1925/03/25 (88 y.o. [name], [name] Primary Care Deion Swift: Marda Stalker Other Clinician: Referring Terril Amaro: Treating Marinna Blane/Extender: Marjo Bicker Weeks in Treatment: 18 Wound Status Wound Number: 26 Primary Venous Leg Ulcer Etiology: Wound Location: Left, Lateral Ankle Wound Open Wounding Event: Gradually Appeared Status: Date Acquired: 11/20/2021 Comorbid Anemia, Congestive Heart Failure, Hypertension, Peripheral Weeks Of Treatment: 18 History: Venous Disease, Osteoarthritis, Received Chemotherapy Clustered Wound: No Photos Wound Measurements Length: (cm) 2.4 Width: (cm) 1.9 Depth: (cm) 0.3 Area: (cm) 3.581 Volume: (cm) 1.074 % Reduction in Area: 62% % Reduction in Volume:  -14% Epithelialization: Small (1-33%) Tunneling: No Undermining: No Wound Description Classification: Full Thickness Without Exposed Support Structures Wound Margin: Distinct, outline attached Exudate Amount: Medium Exudate Type: Serosanguineous Exudate Color: red, brown Foul Odor After Cleansing: No Slough/Fibrino Yes Wound Bed Granulation Amount: Large (67-100%) Exposed Structure Granulation Quality: Red Fascia Exposed: No Necrotic Amount: Small (1-33%) Fat Layer (Subcutaneous Tissue) Exposed: Yes Necrotic Quality: Adherent Slough Tendon Exposed: No Muscle Exposed: No Joint Exposed: No Bone Exposed: No Electronic Signature(s) Signed: 04/02/2022 2:51:54 PM By: [name] Signed: 05/04/2022 8:46:54 AM By: Erenest Blank  Entered By: Erenest Blank on 04/02/2022 11:32:21 -------------------------------------------------------------------------------- Vitals Details Patient Name: Date of Service: CRESCENT, [place]OTH[place] RNESTINE 04/02/2022 10:45 A M Medical Record Number: [id number] Patient Account Number: [id number] Date of Birth/Sex: Treating RN: 01-11-1925 (88 y.o. [name], [name] Primary Care Taelor Waymire: Marda Stalker Other Clinician: Referring Vignesh Willert: Treating Mackinzie Vuncannon/Extender: Aviva Signs in Treatment: 18 Vital Signs Time Taken: 11:15 Temperature (F): 98.1 Height (in): 66 Pulse (bpm): 54 Weight (lbs): 176 Respiratory Rate (breaths/min): 16 Body Mass Index (BMI): 28.4 Blood Pressure (mmHg): 148/61 Reference Range: 80 - 120 mg / dl Electronic Signature(s) Signed: 05/04/2022 8:46:54 AM By: Erenest Blank Entered By: Erenest Blank on 04/02/2022 11:15:50

## 2022-05-04 NOTE — Progress Notes (Signed)
ALOMA, BOCH ([id number]) Visit Report for 04/09/2022 Arrival Information Details Patient Name: Date of Service: CARO, BRUNDIDGE 04/09/2022 10:45 A M Medical Record Number: [id number] Patient Account Number: [id number] Date of Birth/Sex: Treating RN: [birth year] (88 y.o. [name], [name] Primary Care Maysa Lynn: Marda Stalker Other Clinician: Referring Jazzmon Prindle: Treating Drayce Tawil/Extender: Aviva Signs in Treatment: 21 Visit Information History Since Last Visit Added or deleted any medications: No Patient Arrived: Wheel Chair Any new allergies or adverse reactions: No Arrival Time: 10:38 Had a fall or experienced change in No Accompanied By: daughter activities of daily living that may affect Transfer Assistance: Manual risk of falls: Patient Identification Verified: Yes Signs or symptoms of abuse/neglect since last visito No Secondary Verification Process Completed: Yes Hospitalized since last visit: No Patient Requires Transmission-Based Precautions: No Implantable device outside of the clinic excluding No Patient Has Alerts: No cellular tissue based products placed in the center since last visit: Has Dressing in Place as Prescribed: Yes Has Compression in Place as Prescribed: Yes Pain Present Now: No Electronic Signature(s) Signed: 04/09/2022 12:30:35 PM By: [name] Entered By: Rhae Hammock on 04/09/2022 10:38:50 -------------------------------------------------------------------------------- Compression Therapy Details Patient Name: Date of Service: SUMAYA, RIEDESEL RNESTINE 04/09/2022 10:45 A M Medical Record Number: [id number] Patient Account Number: [id number] Date of Birth/Sex: Treating RN: 05/22/1925 (88 y.o. [name], [name] Primary Care Char Feltman: Marda Stalker Other Clinician: Referring Brek Reece: Treating Geneve Kimpel/Extender: Aviva Signs in Treatment: 19 Compression Therapy  Performed for Wound Assessment: Wound #26 Left,Lateral Ankle Performed By: Clinician Rhae Hammock, RN Compression Type: Three Layer Post Procedure Diagnosis Same as Pre-procedure Electronic Signature(s) Signed: 04/09/2022 12:30:35 PM By: [name] Entered By: Rhae Hammock on 04/09/2022 11:16:00 -------------------------------------------------------------------------------- Encounter Discharge Information Details Patient Name: Date of Service: PENNYE, BEEGHLY RNESTINE 04/09/2022 10:45 A M Medical Record Number: [id number] Patient Account Number: [id number] Date of Birth/Sex: Treating RN: [birth year] (88 y.o. [name], [name] Primary Care Mckenze Slone: Marda Stalker Other Clinician: Referring Omar Orrego: Treating Roddie Riegler/Extender: Aviva Signs in Treatment: 64 Encounter Discharge Information Items Post Procedure Vitals Discharge Condition: Stable Temperature (F): 98.7 Ambulatory Status: Wheelchair Pulse (bpm): 74 Discharge Destination: Home Respiratory Rate (breaths/min): 17 Transportation: Private Auto Blood Pressure (mmHg): 134/74 Accompanied By: daughter Schedule Follow-up Appointment: Yes Clinical Summary of Care: Patient Declined Electronic Signature(s) Signed: 04/09/2022 12:30:35 PM By: [name] Entered By: Rhae Hammock on 04/09/2022 11:32:08 -------------------------------------------------------------------------------- Lower Extremity Assessment Details Patient Name: Date of Service: CORENE, RESNICK RNESTINE 04/09/2022 10:45 A M Medical Record Number: [id number] Patient Account Number: [id number] Date of Birth/Sex: Treating RN: 03-04-1925 (88 y.o. [name], [name] Primary Care Enda Santo: Marda Stalker Other Clinician: Referring Danford Tat: Treating Josiane Labine/Extender: Marjo Bicker Weeks in Treatment: 19 Edema Assessment Assessed: [name]: Yes] Patrice Paradise: No] Edema: [Left: N] [Right:  o] Calf Left: Right: Point of Measurement: 30 cm From Medial Instep 31.4 cm Ankle Left: Right: Point of Measurement: 11 cm From Medial Instep 20.5 cm Vascular Assessment Pulses: Dorsalis Pedis Palpable: [Left:Yes] Posterior Tibial Palpable: [Left:Yes] Electronic Signature(s) Signed: 04/09/2022 12:30:35 PM By: [name] Entered By: Rhae Hammock on 04/09/2022 10:39:16 -------------------------------------------------------------------------------- Multi Wound Chart Details Patient Name: Date of Service: SEVERA, JEREMIAH RNESTINE 04/09/2022 10:45 A M Medical Record Number: [id number] Patient Account Number: [id number] Date of Birth/Sex: Treating RN: Jun 23, 1925 (88 y.o. [name], [name] Primary Care Dhiren Azimi: Marda Stalker Other Clinician: Referring Taquita Demby: Treating Pollyann Roa/Extender: Marjo Bicker Weeks in Treatment: 19 Vital Signs Height(in): 66 Pulse(bpm): 55 Weight(lbs): 176 Blood Pressure(mmHg): 134/77  Body Mass Index(BMI): 28.4 Temperature(F): 98.3 Respiratory Rate(breaths/min): 17 Photos: [N/A:N/A] Left, Lateral Ankle N/A N/A Wound Location: Gradually Appeared N/A N/A Wounding Event: Venous Leg Ulcer N/A N/A Primary Etiology: Anemia, Congestive Heart Failure, N/A N/A Comorbid History: Hypertension, Peripheral Venous Disease, Osteoarthritis, Received Chemotherapy 11/20/2021 N/A N/A Date Acquired: 95 N/A N/A Weeks of Treatment: Open N/A N/A Wound Status: No N/A N/A Wound Recurrence: 2.4x1.5x0.2 N/A N/A Measurements L x W x D (cm) 2.827 N/A N/A A (cm) : rea 0.565 N/A N/A Volume (cm) : 70.00% N/A N/A % Reduction in A rea: 40.00% N/A N/A % Reduction in Volume: Full Thickness Without Exposed N/A N/A Classification: Support Structures Medium N/A N/A Exudate A mount: Serosanguineous N/A N/A Exudate Type: red, brown N/A N/A Exudate Color: Distinct, outline attached N/A N/A Wound Margin: Large (67-100%)  N/A N/A Granulation A mount: Red N/A N/A Granulation Quality: Small (1-33%) N/A N/A Necrotic A mount: Fat Layer (Subcutaneous Tissue): Yes N/A N/A Exposed Structures: Fascia: No Tendon: No Muscle: No Joint: No Bone: No Small (1-33%) N/A N/A Epithelialization: Debridement - Excisional N/A N/A Debridement: Pre-procedure Verification/Time Out 11:13 N/A N/A Taken: Lidocaine N/A N/A Pain Control: Subcutaneous, Slough N/A N/A Tissue Debrided: Skin/Subcutaneous Tissue N/A N/A Level: 3.6 N/A N/A Debridement A (sq cm): rea Curette N/A N/A Instrument: Minimum N/A N/A Bleeding: Pressure N/A N/A Hemostasis A chieved: 0 N/A N/A Procedural Pain: 0 N/A N/A Post Procedural Pain: Procedure was tolerated well N/A N/A Debridement Treatment Response: 2.4x1.5x0.2 N/A N/A Post Debridement Measurements L x W x D (cm) 0.565 N/A N/A Post Debridement Volume: (cm) Compression Therapy N/A N/A Procedures Performed: Debridement Treatment Notes Electronic Signature(s) Signed: 04/09/2022 11:35:25 AM By: Kalman Shan DO Signed: 04/09/2022 12:30:35 PM By: [name] Entered By: Kalman Shan on 04/09/2022 11:26:26 -------------------------------------------------------------------------------- Multi-Disciplinary Care Plan Details Patient Name: Date of Service: RAYANNA, MATUSIK RNESTINE 04/09/2022 10:45 A M Medical Record Number: [id number] Patient Account Number: [id number] Date of Birth/Sex: Treating RN: [birth year] (88 y.o. [name], [name] Primary Care Chriss Mannan: Marda Stalker Other Clinician: Referring Dane Bloch: Treating Brooklynne Pereida/Extender: Aviva Signs in Treatment: 87 Active Inactive Nutrition Nursing Diagnoses: Imbalanced nutrition Goals: Patient/caregiver agrees to and verbalizes understanding of need to use nutritional supplements and/or vitamins as prescribed Date Initiated: 11/27/2021 Target Resolution Date: 04/24/2022 Goal  Status: Active Interventions: Assess patient nutrition upon admission and as needed per policy Provide education on nutrition Treatment Activities: Education provided on Nutrition : 03/19/2022 Notes: Wound/Skin Impairment Nursing Diagnoses: Impaired tissue integrity Goals: Patient/caregiver will verbalize understanding of skin care regimen Date Initiated: 11/27/2021 Target Resolution Date: 04/24/2022 Goal Status: Active Ulcer/skin breakdown will have a volume reduction of 30% by week 4 Date Initiated: 11/27/2021 Date Inactivated: 12/31/2021 Target Resolution Date: 12/25/2021 Unmet Reason: according to Goal Status: Unmet measurements has not reduced. Interventions: Assess patient/caregiver ability to obtain necessary supplies Assess patient/caregiver ability to perform ulcer/skin care regimen upon admission and as needed Assess ulceration(s) every visit Provide education on ulcer and skin care Treatment Activities: Topical wound management initiated : 11/27/2021 Notes: Electronic Signature(s) Signed: 04/09/2022 12:30:35 PM By: [name] Entered By: Rhae Hammock on 04/09/2022 11:20:57 -------------------------------------------------------------------------------- Pain Assessment Details Patient Name: Date of Service: DEVONIA, FARRO RNESTINE 04/09/2022 10:45 A M Medical Record Number: [id number] Patient Account Number: [id number] Date of Birth/Sex: Treating RN: Jul 29, 1925 (88 y.o. [name], [name] Primary Care Kionna Brier: Marda Stalker Other Clinician: Referring Nadia Viar: Treating Laylanie Kruczek/Extender: Aviva Signs in Treatment: 41 Active Problems Location of Pain Severity and Description of  Pain Patient Has Paino No Site Locations Pain Management and Medication Current Pain Management: Electronic Signature(s) Signed: 04/09/2022 12:30:35 PM By: [name] Entered By: Rhae Hammock on 04/09/2022  10:39:10 -------------------------------------------------------------------------------- Patient/Caregiver Education Details Patient Name: Date of Service: Thalia Party RNESTINE 5/19/2023andnbsp10:45 A M Medical Record Number: [id number] Patient Account Number: [id number] Date of Birth/Gender: Treating RN: 02-23-1925 (88 y.o. [name], [name] Primary Care Physician: Marda Stalker Other Clinician: Referring Physician: Treating Physician/Extender: Aviva Signs in Treatment: 64 Education Assessment Education Provided To: Patient Education Topics Provided Wound/Skin Impairment: Methods: Explain/Verbal Responses: Reinforcements needed, State content correctly Electronic Signature(s) Signed: 04/09/2022 12:30:35 PM By: [name] Entered By: Rhae Hammock on 04/09/2022 11:21:12 -------------------------------------------------------------------------------- Wound Assessment Details Patient Name: Date of Service: Detwiler, Baldwyn 04/09/2022 10:45 A M Medical Record Number: [id number] Patient Account Number: [id number] Date of Birth/Sex: Treating RN: 1925/01/07 (88 y.o. [name], [name] Primary Care Omolara Carol: Marda Stalker Other Clinician: Referring Lolly Glaus: Treating Pailynn Vahey/Extender: Marjo Bicker Weeks in Treatment: 19 Wound Status Wound Number: 26 Primary Venous Leg Ulcer Etiology: Wound Location: Left, Lateral Ankle Wound Open Wounding Event: Gradually Appeared Status: Date Acquired: 11/20/2021 Comorbid Anemia, Congestive Heart Failure, Hypertension, Peripheral Weeks Of Treatment: 19 History: Venous Disease, Osteoarthritis, Received Chemotherapy Clustered Wound: No Photos Wound Measurements Length: (cm) 2.4 Width: (cm) 1.5 Depth: (cm) 0.2 Area: (cm) 2.827 Volume: (cm) 0.565 % Reduction in Area: 70% % Reduction in Volume: 40% Epithelialization: Small (1-33%) Tunneling:  No Undermining: No Wound Description Classification: Full Thickness Without Exposed Support Structures Wound Margin: Distinct, outline attached Exudate Amount: Medium Exudate Type: Serosanguineous Exudate Color: red, brown Foul Odor After Cleansing: No Slough/Fibrino Yes Wound Bed Granulation Amount: Large (67-100%) Exposed Structure Granulation Quality: Red Fascia Exposed: No Necrotic Amount: Small (1-33%) Fat Layer (Subcutaneous Tissue) Exposed: Yes Necrotic Quality: Adherent Slough Tendon Exposed: No Muscle Exposed: No Joint Exposed: No Bone Exposed: No Electronic Signature(s) Signed: 04/09/2022 12:30:35 PM By: [name] Signed: 05/04/2022 8:46:54 AM By: Erenest Blank Entered By: Erenest Blank on 04/09/2022 11:05:13 -------------------------------------------------------------------------------- Vitals Details Patient Name: Date of Service: ZANYIAH, POSTEN RNESTINE 04/09/2022 10:45 A M Medical Record Number: [id number] Patient Account Number: [id number] Date of Birth/Sex: Treating RN: 02/24/1925 (88 y.o. [name], [name] Primary Care Virgina Deakins: Marda Stalker Other Clinician: Referring Tranquilino Fischler: Treating Makayah Pauli/Extender: Aviva Signs in Treatment: 19 Vital Signs Time Taken: 10:38 Temperature (F): 98.3 Height (in): 66 Pulse (bpm): 55 Weight (lbs): 176 Respiratory Rate (breaths/min): 17 Body Mass Index (BMI): 28.4 Blood Pressure (mmHg): 134/77 Reference Range: 80 - 120 mg / dl Electronic Signature(s) Signed: 04/09/2022 12:30:35 PM By: [name] Entered By: Rhae Hammock on 04/09/2022 10:52:19

## 2022-05-07 ENCOUNTER — Encounter (HOSPITAL_BASED_OUTPATIENT_CLINIC_OR_DEPARTMENT_OTHER): Payer: Medicare PPO | Admitting: Internal Medicine

## 2022-05-07 DIAGNOSIS — Z853 Personal history of malignant neoplasm of breast: Secondary | ICD-10-CM | POA: Diagnosis not present

## 2022-05-07 DIAGNOSIS — I87312 Chronic venous hypertension (idiopathic) with ulcer of left lower extremity: Secondary | ICD-10-CM | POA: Diagnosis not present

## 2022-05-07 DIAGNOSIS — Z8543 Personal history of malignant neoplasm of ovary: Secondary | ICD-10-CM | POA: Diagnosis not present

## 2022-05-07 DIAGNOSIS — I110 Hypertensive heart disease with heart failure: Secondary | ICD-10-CM | POA: Diagnosis not present

## 2022-05-07 DIAGNOSIS — L97822 Non-pressure chronic ulcer of other part of left lower leg with fat layer exposed: Secondary | ICD-10-CM | POA: Diagnosis not present

## 2022-05-07 DIAGNOSIS — C541 Malignant neoplasm of endometrium: Secondary | ICD-10-CM | POA: Diagnosis not present

## 2022-05-07 DIAGNOSIS — I5042 Chronic combined systolic (congestive) and diastolic (congestive) heart failure: Secondary | ICD-10-CM | POA: Diagnosis not present

## 2022-05-07 DIAGNOSIS — G309 Alzheimer's disease, unspecified: Secondary | ICD-10-CM | POA: Diagnosis not present

## 2022-05-07 NOTE — Progress Notes (Signed)
[name], [name] ([id number]) Visit Report for 05/07/2022 Arrival Information Details Patient Name: Date of Service: [name], [name] 05/07/2022 10:00 A M Medical Record Number: [id number] Patient Account Number: [id number] Date of Birth/Sex: Treating RN: January 20, 1925 (88 y.o. [name], [name] Primary Care Lavanda Nevels: Marda Stalker Other Clinician: Referring Roxene Alviar: Treating Aldan Camey/Extender: Aviva Signs in Treatment: 23 Visit Information History Since Last Visit Added or deleted any medications: No Patient Arrived: Wheel Chair Any new allergies or adverse reactions: No Arrival Time: 10:01 Had a fall or experienced change in No Accompanied By: Derek Jack activities of daily living that may affect Transfer Assistance: Manual risk of falls: Patient Identification Verified: Yes Signs or symptoms of abuse/neglect since last visito No Secondary Verification Process Completed: Yes Hospitalized since last visit: No Patient Requires Transmission-Based Precautions: No Implantable device outside of the clinic excluding No Patient Has Alerts: No cellular tissue based products placed in the center since last visit: Has Dressing in Place as Prescribed: Yes Has Compression in Place as Prescribed: Yes Pain Present Now: No Electronic Signature(s) Signed: 05/07/2022 12:21:07 PM By: [name] Entered By: Rhae Hammock on 05/07/2022 10:01:42 -------------------------------------------------------------------------------- Compression Therapy Details Patient Name: Date of Service: [name], [name] 05/07/2022 10:00 A M Medical Record Number: [id number] Patient Account Number: [id number] Date of Birth/Sex: Treating RN: June 02, 1925 (88 y.o. [name], [name] Primary Care Layza Summa: Marda Stalker Other Clinician: Referring Jaley Yan: Treating Ranetta Armacost/Extender: Aviva Signs in Treatment: 23 Compression Therapy  Performed for Wound Assessment: Wound #26 Left,Lateral Ankle Performed By: Clinician Rhae Hammock, RN Compression Type: Three Layer Post Procedure Diagnosis Same as Pre-procedure Electronic Signature(s) Signed: 05/07/2022 12:21:07 PM By: [name] Entered By: Rhae Hammock on 05/07/2022 10:17:36 -------------------------------------------------------------------------------- Encounter Discharge Information Details Patient Name: Date of Service: [name], [name] 05/07/2022 10:00 A M Medical Record Number: [id number] Patient Account Number: [id number] Date of Birth/Sex: Treating RN: 08-30-1925 (88 y.o. [name], [name] Primary Care Danen Lapaglia: Marda Stalker Other Clinician: Referring Maclane Holloran: Treating Auden Wettstein/Extender: Aviva Signs in Treatment: 23 Encounter Discharge Information Items Post Procedure Vitals Discharge Condition: Stable Temperature (F): 98.7 Ambulatory Status: Wheelchair Pulse (bpm): 74 Discharge Destination: Home Respiratory Rate (breaths/min): 17 Transportation: Private Auto Blood Pressure (mmHg): 140/74 Accompanied By: daughter Schedule Follow-up Appointment: Yes Clinical Summary of Care: Patient Declined Electronic Signature(s) Signed: 05/07/2022 12:21:07 PM By: [name] Entered By: Rhae Hammock on 05/07/2022 11:43:24 -------------------------------------------------------------------------------- Lower Extremity Assessment Details Patient Name: Date of Service: [name], [name] 05/07/2022 10:00 A M Medical Record Number: [id number] Patient Account Number: [id number] Date of Birth/Sex: Treating RN: 1925/04/30 (88 y.o. [name], [name] Primary Care Kynzie Polgar: Marda Stalker Other Clinician: Referring Lenorris Karger: Treating Yader Criger/Extender: Marjo Bicker Weeks in Treatment: 23 Edema Assessment Assessed: [name]: Yes] Patrice Paradise: No] Edema: [Left: N] [Right:  o] Calf Left: Right: Point of Measurement: 30 cm From Medial Instep 31.4 cm Ankle Left: Right: Point of Measurement: 11 cm From Medial Instep 20.5 cm Vascular Assessment Pulses: Dorsalis Pedis Palpable: [Left:Yes] Posterior Tibial Palpable: [Left:Yes] Electronic Signature(s) Signed: 05/07/2022 12:21:07 PM By: [name] Entered By: Rhae Hammock on 05/07/2022 10:10:24 -------------------------------------------------------------------------------- [place] Details Patient Name: Date of Service: [name], [name] 05/07/2022 10:00 A M Medical Record Number: [id number] Patient Account Number: [id number] Date of Birth/Sex: Treating RN: 1925-01-26 (88 y.o. [name], [name] Primary Care Dallin Mccorkel: Marda Stalker Other Clinician: Referring Zebulin Siegel: Treating Amanie Mcculley/Extender: Aviva Signs in Treatment: 23 Active Inactive Nutrition Nursing Diagnoses: Imbalanced nutrition Goals: Patient/caregiver agrees to  and verbalizes understanding of need to use nutritional supplements and/or vitamins as prescribed Date Initiated: 11/27/2021 T arget Resolution Date: 05/22/2022 Goal Status: Active Interventions: Assess patient nutrition upon admission and as needed per policy Provide education on nutrition Treatment Activities: Education provided on Nutrition : 04/22/2022 Notes: Wound/Skin Impairment Nursing Diagnoses: Impaired tissue integrity Goals: Patient/caregiver will verbalize understanding of skin care regimen Date Initiated: 11/27/2021 Target Resolution Date: 05/22/2022 Goal Status: Active Ulcer/skin breakdown will have a volume reduction of 30% by week 4 Date Initiated: 11/27/2021 Date Inactivated: 12/31/2021 Target Resolution Date: 12/25/2021 Unmet Reason: according to Goal Status: Unmet measurements has not reduced. Interventions: Assess patient/caregiver ability to obtain necessary supplies Assess patient/caregiver  ability to perform ulcer/skin care regimen upon admission and as needed Assess ulceration(s) every visit Provide education on ulcer and skin care Treatment Activities: Topical wound management initiated : 11/27/2021 Notes: Electronic Signature(s) Signed: 05/07/2022 12:21:07 PM By: [name] Entered By: Rhae Hammock on 05/07/2022 10:40:33 -------------------------------------------------------------------------------- Pain Assessment Details Patient Name: Date of Service: [name], [name] 05/07/2022 10:00 [place] Record Number: [id number] Patient Account Number: [id number] Date of Birth/Sex: Treating RN: 03-Aug-1925 (88 y.o. [name], [name] Primary Care Eugenia Eldredge: Marda Stalker Other Clinician: Referring Albi Rappaport: Treating Linday Rhodes/Extender: Marjo Bicker Weeks in Treatment: 23 Active Problems Location of Pain Severity and Description of Pain Patient Has Paino No Site Locations Pain Management and Medication Current Pain Management: Electronic Signature(s) Signed: 05/07/2022 12:21:07 PM By: [name] Entered By: Rhae Hammock on 05/07/2022 10:10:16 -------------------------------------------------------------------------------- Patient/Caregiver Education Details Patient Name: Date of Service: [name] 6/16/2023andnbsp10:00 [place] Record Number: [id number] Patient Account Number: [id number] Date of Birth/Gender: Treating RN: 09/01/1925 (88 y.o. [name], [name] Primary Care Physician: Marda Stalker Other Clinician: Referring Physician: Treating Physician/Extender: Aviva Signs in Treatment: 4 Education Assessment Education Provided To: Patient Education Topics Provided Nutrition: Methods: Explain/Verbal Responses: Reinforcements needed Electronic Signature(s) Signed: 05/07/2022 12:21:07 PM By: [name] Entered By: Rhae Hammock on  05/07/2022 10:41:29 -------------------------------------------------------------------------------- Wound Assessment Details Patient Name: Date of Service: [name], [name] 05/07/2022 10:00 [place] Record Number: [id number] Patient Account Number: [id number] Date of Birth/Sex: Treating RN: 03/24/1925 (88 y.o. [name], [name] Primary Care Yoceline Bazar: Marda Stalker Other Clinician: Referring Rendon Howell: Treating Fredrik Mogel/Extender: Marjo Bicker Weeks in Treatment: 23 Wound Status Wound Number: 26 Primary Venous Leg Ulcer Etiology: Wound Location: Left, Lateral Ankle Wound Open Wounding Event: Gradually Appeared Status: Date Acquired: 11/20/2021 Comorbid Anemia, Congestive Heart Failure, Hypertension, Peripheral Weeks Of Treatment: 23 History: Venous Disease, Osteoarthritis, Received Chemotherapy Clustered Wound: No Photos Wound Measurements Length: (cm) 2 Width: (cm) 1.3 Depth: (cm) 0.2 Area: (cm) 2.042 Volume: (cm) 0.408 % Reduction in Area: 78.3% % Reduction in Volume: 56.7% Epithelialization: Small (1-33%) Tunneling: No Undermining: No Wound Description Classification: Full Thickness Without Exposed Support Structures Wound Margin: Distinct, outline attached Exudate Amount: Medium Exudate Type: Serosanguineous Exudate Color: red, brown Foul Odor After Cleansing: No Slough/Fibrino Yes Wound Bed Granulation Amount: Large (67-100%) Exposed Structure Granulation Quality: Red Fascia Exposed: No Necrotic Amount: Small (1-33%) Fat Layer (Subcutaneous Tissue) Exposed: Yes Necrotic Quality: Adherent Slough Tendon Exposed: No Muscle Exposed: No Joint Exposed: No Bone Exposed: No Treatment Notes Wound #26 (Ankle) Wound Laterality: Left, Lateral Cleanser Soap and Water Discharge Instruction: May shower and wash wound with dial antibacterial soap and water prior to dressing change. Peri-Wound Care Sween Lotion (Moisturizing  lotion) Discharge Instruction: Apply moisturizing lotion as directed Topical [place]stone topical Antibiotics Discharge  Instruction: Mix medication according to pharmacy instruction apply to wound bed. Primary Dressing Hydrofera Blue Classic Foam, 4x4 in Discharge Instruction: Moisten with saline apply over the [facility] medication. Secondary Dressing Zetuvit Plus 4x8 in Discharge Instruction: Apply over primary dressing as directed. Secured With Compression Wrap ThreePress (3 layer compression wrap) Discharge Instruction: Apply three layer compression as directed. Compression Stockings Add-Ons Electronic Signature(s) Signed: 05/07/2022 12:21:07 PM By: [name] Signed: 05/07/2022 12:24:30 PM By: Deon Pilling RN, BSN Entered By: Deon Pilling on 05/07/2022 10:11:35 -------------------------------------------------------------------------------- Vitals Details Patient Name: Date of Service: [name], [name] 05/07/2022 10:00 [place] Record Number: [id number] Patient Account Number: [id number] Date of Birth/Sex: Treating RN: Sep 08, 1925 (88 y.o. [name], [name] Primary Care Neida Ellegood: Marda Stalker Other Clinician: Referring Natajah Derderian: Treating Londen Bok/Extender: Aviva Signs in Treatment: 23 Vital Signs Time Taken: 10:00 Temperature (F): 98 Height (in): 66 Pulse (bpm): 60 Weight (lbs): 176 Respiratory Rate (breaths/min): 17 Body Mass Index (BMI): 28.4 Blood Pressure (mmHg): 117/67 Reference Range: 80 - 120 mg / dl Electronic Signature(s) Signed: 05/07/2022 12:21:07 PM By: [name] Entered By: Rhae Hammock on 05/07/2022 10:02:00

## 2022-05-10 ENCOUNTER — Other Ambulatory Visit: Payer: Self-pay | Admitting: Cardiology

## 2022-05-12 NOTE — Progress Notes (Signed)
TYRENA, GOHR ([id number]) Visit Report for 05/07/2022 Chief Complaint Document Details Patient Name: Date of Service: [name], [name] 05/07/2022 10:00 A M Medical Record Number: [id number] Patient Account Number: [id number] Date of Birth/Sex: Treating RN: 29-May-1925 (88 y.o. [name], [name]: Marda Stalker Other Clinician: Referring Provider: Treating Provider/Extender: Aviva Signs in Treatment: 23 Information Obtained from: Patient Chief Complaint Left lower extremity wound Electronic Signature(s) Signed: 05/07/2022 4:25:03 PM By: Kalman Shan DO Entered By: Kalman Shan on 05/07/2022 15:53:55 -------------------------------------------------------------------------------- Debridement Details Patient Name: Date of Service: [name], [name] RNESTINE 05/07/2022 10:00 [place] Record Number: [id number] Patient Account Number: [id number] Date of Birth/Sex: Treating RN: 02-15-1925 (88 y.o. [name], [name]: Marda Stalker Other Clinician: Referring Provider: Treating Provider/Extender: Aviva Signs in Treatment: 23 Debridement Performed for Assessment: Wound #26 Left,Lateral Ankle Performed By: Physician Kalman Shan, DO Debridement Type: Debridement Severity of Tissue Pre Debridement: Fat layer exposed Level of Consciousness (Pre-procedure): Awake and Alert Pre-procedure Verification/Time Out Yes - 10:30 Taken: Start Time: 10:30 Pain Control: Lidocaine T Area Debrided (L x W): otal 2 (cm) x 1.3 (cm) = 2.6 (cm) Tissue and other material debrided: Viable, Non-Viable, Slough, Subcutaneous, Slough Level: Skin/Subcutaneous Tissue Debridement Description: Excisional Instrument: Curette Bleeding: Minimum Hemostasis Achieved: Pressure End Time: 10:30 Procedural Pain: 0 Post Procedural Pain: 0 Response to Treatment: Procedure was tolerated well Level of  Consciousness (Post- Awake and Alert procedure): Post Debridement Measurements of Total Wound Length: (cm) 2 Width: (cm) 1.3 Depth: (cm) 0.2 Volume: (cm) 0.408 Character of Wound/Ulcer Post Debridement: Improved Severity of Tissue Post Debridement: Fat layer exposed Post Procedure Diagnosis Same as Pre-procedure Electronic Signature(s) Signed: 05/07/2022 12:21:07 PM By: [name] Signed: 05/07/2022 4:25:03 PM By: Kalman Shan DO Entered By: Rhae Hammock on 05/07/2022 10:32:11 -------------------------------------------------------------------------------- HPI Details Patient Name: Date of Service: [name], [name] RNESTINE 05/07/2022 10:00 A M Medical Record Number: [id number] Patient Account Number: [id number] Date of Birth/Sex: Treating RN: 1925/05/04 (88 y.o. [name], [name]: Marda Stalker Other Clinician: Referring Provider: Treating Provider/Extender: Aviva Signs in Treatment: 23 History of Present Illness HPI Description: this is a patient we know from several prior wounds on her bilateral lower extremities. She has venous stasis physiology, inflammation and hypertension. She is been fully evaluated for venous ablation and was found not to be a candidate. She does not have significant arterial disease. 02/14/15 the wound itself on her lateral left leg did not look too bad however there was surrounding maceration which was concerning 02/27/15 the wound itself is small with surrounding circumferential epithelialization there is no surrounding maceration. 03/06/15 only a very small area remains. I think this is mostly epithelialized however I would be uncomfortable not dressing this this week 03/13/15 the area is epithelialized. There was a thick surface on this. I took a #15 blade then lightly disrupted it but there does not appear to be any open area I did not see anything but appropriate  epithelium Readmission: 08/16/18 on evaluation today patient presents for initial evaluation and our clinic released readmission although she has not been seen since April 2016. Nonetheless she is having issues with the ulcers currently on the bilateral lateral malleolus or locations as well as the left lateral lower extremity. These have been present for roughly 3 weeks since the patient actually developed significant bilateral lower extremity edema which patient's daughter states was roughly 3 times the size of her legs currently. Subsequently she  ended up in the emerging department due to congestive heart failure. They were able to get the swelling under control and fortunately she seems to be doing much better at this point in time. She was placed in an [employer] which she sat on for week part evaluation today as well. Fortunately the wound areas actually seem to be doing fairly well there is some macerated skin surrounding there are the areas/surface of the wound which are gonna require some debridement at this point. No fevers, chills, nausea, or vomiting noted at this time. Patient has a history of hypertension, mild peripheral vascular disease, chronic venous stasis, and other than the ulcer seems to be doing fairly well today. No fevers, chills, nausea, or vomiting noted at this time. 08/23/18 on evaluation today patient actually appears to be doing great in regard to her bilateral lower should be ulcers. She's shown signs of improvement even just with one week with the [employer]. In general I'm actually very pleased with how things appear at this point. 09/06/18 on evaluation today patient actually appears to be doing much better her left lower extremity is completely healed. Go to the right lower extremity lateral malleolus ulcer this still seems to be showing signs of being open. There does not appear to be evidence of infection which is good news. This did require some sharp  debridement however to remove some of the necrotic tissue/eschar on the surface of the wound Readmission 11/27/2021 [name] is a 88 year old female with a past medical history of endometrial and breast cancer, chronic venous insufficiency, dementia and essential hypertension that presents to the clinic with a 1 month history of nonhealing ulcer to the left lower extremity. She states that the ulcer opened 1 month ago and then healed but has reopened again in the past week. She is currently keeping the area covered. She has a history of wounds to her lower extremities. She has compression stockings that she uses daily. She currently denies signs of infection. 1/13; patient presents for follow-up. She followed up for nurse visit for wrap change. She reports more odor. She denies pain. 1/20; patient presents for follow-up. She has been using gentamicin ointment with calcium alginate to the wound bed. She reports improvement in odor and drainage. She currently denies systemic signs of infection. 1/27; patient presents for follow-up. She has been using gentamicin ointment with calcium alginate. She received her Keystone antibiotic 2 days ago. She brought this in. Home health has also been established. They will come out for the first time next week. She currently denies signs of infection. She has no issues or complaints today. 2/2; patient presents for follow-up. She has been using Keystone antibiotics with [facility] with no issues. She denies signs of infection. She has no issues or complaints today. 2/9; patient presents for follow-up. She has been using Keystone antibiotics with [facility] with no issues. She has tolerated the compression wrap well. She denies signs of infection. 2/16; patient presents for follow-up. She has been using Keystone antibiotics with Hydrofera Blue under compression wrap with no issues. She currently denies signs of infection. 2/23; patient presents  for follow-up. She continues to use Keystone antibiotics with Hydrofera Blue under the compression wrap. She has no issues or complaints today. She denies signs of infection. Home health continues to come out and change the wrap. 3/2; patient presents for follow-up. She continues to use [facility] with [facility] under compression with no issues. She denies signs  of infection. 3/16; patient presents for follow-up. She continues to use [facility] with [facility] under compression with no issues. Home health change the dressing last week. She denies signs of infection. 3/23; patient presents for follow-up. She continues to use Keystone antibiotics with Hydrofera Blue under compression with no issues. Home health continues to change the dressings. She denies signs of infection. 3/30; patient presents for follow-up. She continues to use Keystone antibiotics with Hydrofera Blue under compression with no issues. Home health continues to change the dressings. 4/13 2-week follow-up. Keystone and Hydrofera Blue wound is roughly 2.3 cm in diameter smaller. No other issues 4/20; patient presents for follow-up. She continues to use Keystone antibiotic with [facility] with no issues. 4/28; patient with a difficult wound on her right medial lower leg. Using [facility] Blue minimal improvement per our intake nurse we are using compression. She has home health changing the dressing 5/5; patient presents for follow-up. We have been using Keystone antibiotics and Hydrofera Blue under compression therapy. She has no issues or complaints today. We discussed potentially using an advanced tissue product. She states she has had this done in the past and would like to proceed to see what her insurance would cover. 5/15; patient presents for follow-up. She has been using Keystone antibiotics and Hydrofera Blue under compression therapy. She forgot her Keystone antibiotics today. Insurance has not  approved her for skin substitute. She currently denies signs of infection. She has home health that changes the dressing once a week. 5/19; patient presents for follow-up. We have been using Keystone antibiotic and Hydrofera Blue under compression therapy. She has no issues or complaints today. The [employer] rep confirmed over the phone that the patient has been 100% approved for their skin substitute. We will verify this as we have not received any paperwork stating this. Patient states she would like to proceed with a skin substitute if it is covered by insurance. 6/1; patient presents for follow-up. We have been using Keystone antibiotic and Hydrofera Blue under compression therapy. We confirmed that she does have a co-pay for the [employer] skin substitute. We will hold off on using this for now since she continues to show improvement with current therapy. She denies signs of infection. She has home health that comes out twice weekly. 6/16; patient presents for follow-up. We have been using Keystone antibiotic and Hydrofera Blue under compression therapy. She has no issues or complaints today. We discussed the potential trial of vendaje. This is placenta tissue. She states she would like to try the free trial. We will reach out to our rep. Electronic Signature(s) Signed: 05/07/2022 4:25:03 PM By: Kalman Shan DO Entered By: Kalman Shan on 05/07/2022 15:58:17 -------------------------------------------------------------------------------- Physical Exam Details Patient Name: Date of Service: BRYNNLEY, DAYRIT RNESTINE 05/07/2022 10:00 A M Medical Record Number: [id number] Patient Account Number: [id number] Date of Birth/Sex: Treating RN: Apr 01, 1925 (88 y.o. [name], [name]: Marda Stalker Other Clinician: Referring Provider: Treating Provider/Extender: Marjo Bicker Weeks in Treatment: 23 Constitutional respirations regular, non-labored and  within target range for patient.. Cardiovascular 2+ dorsalis pedis/posterior tibialis pulses. Psychiatric pleasant and cooperative. Notes Left lower extremity: T the lateral aspect there is an open wound with granulation tissue and nonviable tissue present. No surrounding signs of infection. o Electronic Signature(s) Signed: 05/07/2022 4:25:03 PM By: Kalman Shan DO Entered By: Kalman Shan on 05/07/2022 15:59:11 -------------------------------------------------------------------------------- Physician Orders Details Patient Name: Date of Service: VERGINIA, [name] RNESTINE 05/07/2022 10:00 A M Medical Record  Number: [id number] Patient Account Number: [id number] Date of Birth/Sex: Treating RN: 1925/04/18 (88 y.o. [name], [name]: Marda Stalker Other Clinician: Referring Provider: Treating Provider/Extender: Aviva Signs in Treatment: 69 Verbal / Phone Orders: No Diagnosis Coding Follow-up Appointments ppointment in 2 weeks. - 05/21/22 @ 1300 w/ Dr. Heber Sharon and Allayne Butcher Room # 9 Return A ***Continue to bring keystone topical antibiotic medication to each visit.*** Bathing/ Shower/ Hygiene May shower with protection but do not get wound dressing(s) wet. - use cast protector. May shower and wash wound with soap and water. - with dressing changes may wash with soap and water. Edema Control - Lymphedema / SCD / Other Elevate legs to the level of the heart or above for 30 minutes daily and/or when sitting, a frequency of: Avoid standing for long periods of time. Patient to wear own compression stockings every day. - apply in the morning and remove at night to right leg. Exercise regularly Moisturize legs daily. - every night before day right leg. Additional Orders / Instructions Follow Nutritious Diet - High Protein Diet Home Health No change in wound care orders this week; continue Home Health for wound care. May utilize  formulary equivalent dressing for wound treatment orders unless otherwise specified. - ****FOR WEEK OF 05/10/22 HOME HEALTH TO GO OUT TWICE TO CHANGE WRAP**** Home Health for skilled wound care nursing once a week and wound center weekly. Apply keystone topical antibiotics and hydrofera blue classic as primary dressing. NO ZINC OXIDE!! Other Home Health Orders/Instructions: [facility] home health Wound Treatment Wound #26 - Ankle Wound Laterality: Left, Lateral Cleanser: Soap and Water (Home Health) 2 x Per Week/30 Days Discharge Instructions: May shower and wash wound with dial antibacterial soap and water prior to dressing change. Peri-Wound Care: Sween Lotion (Moisturizing lotion) (Home Health) 2 x Per Week/30 Days Discharge Instructions: Apply moisturizing lotion as directed Topical: Keystone topical Antibiotics (Home Health) 2 x Per Week/30 Days Discharge Instructions: Mix medication according to pharmacy instruction apply to wound bed. Prim Dressing: Hydrofera Blue Classic Foam, 4x4 in (Home Health) 2 x Per Week/30 Days ary Discharge Instructions: Moisten with saline apply over the [facility] medication. Secondary Dressing: Zetuvit Plus 4x8 in [facility]) 2 x Per Week/30 Days Discharge Instructions: Apply over primary dressing as directed. Compression Wrap: ThreePress (3 layer compression wrap) (Home Health) 2 x Per Week/30 Days Discharge Instructions: Apply three layer compression as directed. Electronic Signature(s) Signed: 05/07/2022 4:25:03 PM By: Kalman Shan DO Previous Signature: 05/07/2022 12:21:07 PM Version By: [name] Entered By: Kalman Shan on 05/07/2022 15:59:22 -------------------------------------------------------------------------------- Problem List Details Patient Name: Date of Service: ARNESHA, SCHIRALDI RNESTINE 05/07/2022 10:00 A M Medical Record Number: [id number] Patient Account Number: [id number] Date of Birth/Sex: Treating RN: 1925-02-25 (88  y.o. [name], [name]: Marda Stalker Other Clinician: Referring Provider: Treating Provider/Extender: Aviva Signs in Treatment: 23 Active Problems ICD-10 Encounter Code Description Active Date MDM Diagnosis I87.312 Chronic venous hypertension (idiopathic) with ulcer of left lower extremity 11/27/2021 No Yes L97.822 Non-pressure chronic ulcer of other part of left lower leg with fat layer exposed1/04/2022 No Yes C50.919 Malignant neoplasm of unspecified site of unspecified female breast 11/27/2021 No Yes I50.42 Chronic combined systolic (congestive) and diastolic (congestive) heart failure 11/27/2021 No Yes G30.9 Alzheimer's disease, unspecified 11/27/2021 No Yes C54.1 Malignant neoplasm of endometrium 11/27/2021 No Yes Inactive Problems Resolved Problems Electronic Signature(s) Signed: 05/07/2022 4:25:03 PM By: Kalman Shan DO Entered By: Kalman Shan on  05/07/2022 15:53:38 -------------------------------------------------------------------------------- Progress Note Details Patient Name: Date of Service: CLEMENTINE, SOULLIERE 05/07/2022 10:00 A M Medical Record Number: [id number] Patient Account Number: [id number] Date of Birth/Sex: Treating RN: 1925/10/08 (88 y.o. [name], [name]: Marda Stalker Other Clinician: Referring Provider: Treating Provider/Extender: Aviva Signs in Treatment: 23 Subjective Chief Complaint Information obtained from Patient Left lower extremity wound History of Present Illness (HPI) this is a patient we know from several prior wounds on her bilateral lower extremities. She has venous stasis physiology, inflammation and hypertension. She is been fully evaluated for venous ablation and was found not to be a candidate. She does not have significant arterial disease. 02/14/15 the wound itself on her lateral left leg did not look too bad however  there was surrounding maceration which was concerning 02/27/15 the wound itself is small with surrounding circumferential epithelialization there is no surrounding maceration. 03/06/15 only a very small area remains. I think this is mostly epithelialized however I would be uncomfortable not dressing this this week 03/13/15 the area is epithelialized. There was a thick surface on this. I took a #15 blade then lightly disrupted it but there does not appear to be any open area I did not see anything but appropriate epithelium Readmission: 08/16/18 on evaluation today patient presents for initial evaluation and our clinic released readmission although she has not been seen since April 2016. Nonetheless she is having issues with the ulcers currently on the bilateral lateral malleolus or locations as well as the left lateral lower extremity. These have been present for roughly 3 weeks since the patient actually developed significant bilateral lower extremity edema which patient's daughter states was roughly 3 times the size of her legs currently. Subsequently she ended up in the emerging department due to congestive heart failure. They were able to get the swelling under control and fortunately she seems to be doing much better at this point in time. She was placed in an [employer] which she sat on for week part evaluation today as well. Fortunately the wound areas actually seem to be doing fairly well there is some macerated skin surrounding there are the areas/surface of the wound which are gonna require some debridement at this point. No fevers, chills, nausea, or vomiting noted at this time. Patient has a history of hypertension, mild peripheral vascular disease, chronic venous stasis, and other than the ulcer seems to be doing fairly well today. No fevers, chills, nausea, or vomiting noted at this time. 08/23/18 on evaluation today patient actually appears to be doing great in regard to her bilateral  lower should be ulcers. She's shown signs of improvement even just with one week with the [employer]. In general I'm actually very pleased with how things appear at this point. 09/06/18 on evaluation today patient actually appears to be doing much better her left lower extremity is completely healed. Go to the right lower extremity lateral malleolus ulcer this still seems to be showing signs of being open. There does not appear to be evidence of infection which is good news. This did require some sharp debridement however to remove some of the necrotic tissue/eschar on the surface of the wound Readmission 11/27/2021 Ms. Haset [place] is a 88 year old female with a past medical history of endometrial and breast cancer, chronic venous insufficiency, dementia and essential hypertension that presents to the clinic with a 1 month history of nonhealing ulcer to the left lower extremity. She states that the ulcer  opened 1 month ago and then healed but has reopened again in the past week. She is currently keeping the area covered. She has a history of wounds to her lower extremities. She has compression stockings that she uses daily. She currently denies signs of infection. 1/13; patient presents for follow-up. She followed up for nurse visit for wrap change. She reports more odor. She denies pain. 1/20; patient presents for follow-up. She has been using gentamicin ointment with calcium alginate to the wound bed. She reports improvement in odor and drainage. She currently denies systemic signs of infection. 1/27; patient presents for follow-up. She has been using gentamicin ointment with calcium alginate. She received her Keystone antibiotic 2 days ago. She brought this in. Home health has also been established. They will come out for the first time next week. She currently denies signs of infection. She has no issues or complaints today. 2/2; patient presents for follow-up. She has been using Keystone  antibiotics with [facility] with no issues. She denies signs of infection. She has no issues or complaints today. 2/9; patient presents for follow-up. She has been using Keystone antibiotics with [facility] with no issues. She has tolerated the compression wrap well. She denies signs of infection. 2/16; patient presents for follow-up. She has been using Keystone antibiotics with Hydrofera Blue under compression wrap with no issues. She currently denies signs of infection. 2/23; patient presents for follow-up. She continues to use Keystone antibiotics with Hydrofera Blue under the compression wrap. She has no issues or complaints today. She denies signs of infection. Home health continues to come out and change the wrap. 3/2; patient presents for follow-up. She continues to use [facility] with [facility] under compression with no issues. She denies signs of infection. 3/16; patient presents for follow-up. She continues to use [facility] with [facility] under compression with no issues. Home health change the dressing last week. She denies signs of infection. 3/23; patient presents for follow-up. She continues to use Keystone antibiotics with Hydrofera Blue under compression with no issues. Home health continues to change the dressings. She denies signs of infection. 3/30; patient presents for follow-up. She continues to use Keystone antibiotics with Hydrofera Blue under compression with no issues. Home health continues to change the dressings. 4/13 2-week follow-up. Keystone and Hydrofera Blue wound is roughly 2.3 cm in diameter smaller. No other issues 4/20; patient presents for follow-up. She continues to use Keystone antibiotic with [facility] with no issues. 4/28; patient with a difficult wound on her right medial lower leg. Using [facility] Blue minimal improvement per our intake nurse we are using compression. She has home health changing the dressing 5/5; patient  presents for follow-up. We have been using Keystone antibiotics and Hydrofera Blue under compression therapy. She has no issues or complaints today. We discussed potentially using an advanced tissue product. She states she has had this done in the past and would like to proceed to see what her insurance would cover. 5/15; patient presents for follow-up. She has been using Keystone antibiotics and Hydrofera Blue under compression therapy. She forgot her Keystone antibiotics today. Insurance has not approved her for skin substitute. She currently denies signs of infection. She has home health that changes the dressing once a week. 5/19; patient presents for follow-up. We have been using Keystone antibiotic and Hydrofera Blue under compression therapy. She has no issues or complaints today. The [employer] rep confirmed over the phone that the patient has been 100% approved for  their skin substitute. We will verify this as we have not received any paperwork stating this. Patient states she would like to proceed with a skin substitute if it is covered by insurance. 6/1; patient presents for follow-up. We have been using Keystone antibiotic and Hydrofera Blue under compression therapy. We confirmed that she does have a co-pay for the Kerecis skin substitute. We will hold off on using this for now since she continues to show improvement with current therapy. She denies signs of infection. She has home health that comes out twice weekly. 6/16; patient presents for follow-up. We have been using Keystone antibiotic and Hydrofera Blue under compression therapy. She has no issues or complaints today. We discussed the potential trial of vendaje. This is placenta tissue. She states she would like to try the free trial. We will reach out to our rep. Patient History Information obtained from Patient. Family History Cancer - Child, Hypertension - Child,Father, Kidney Disease - Child, Stroke - Siblings,Child, Thyroid  Problems - Child, No family history of Diabetes, Heart Disease, Hereditary Spherocytosis, Lung Disease, Seizures, Tuberculosis. Social History Never smoker, Marital Status - Widowed, Alcohol Use - Never, Drug Use - No History, Caffeine Use - Never. Medical History Eyes Denies history of Cataracts, Glaucoma, Optic Neuritis Ear/Nose/Mouth/Throat Denies history of Chronic sinus problems/congestion, Middle ear problems Hematologic/Lymphatic Patient has history of Anemia Denies history of Hemophilia, Human Immunodeficiency Virus, Lymphedema, Sickle Cell Disease Respiratory Denies history of Aspiration, Asthma, Chronic Obstructive Pulmonary Disease (COPD), Pneumothorax, Sleep Apnea, Tuberculosis Cardiovascular Patient has history of Congestive Heart Failure, Hypertension, Peripheral Venous Disease Denies history of Angina, Arrhythmia, Coronary Artery Disease, Hypotension, Myocardial Infarction, Peripheral Arterial Disease, Phlebitis, Vasculitis Gastrointestinal Denies history of Cirrhosis , Colitis, Crohnoos, Hepatitis A, Hepatitis B, Hepatitis C Endocrine Denies history of Type I Diabetes, Type II Diabetes Genitourinary Denies history of End Stage Renal Disease Immunological Denies history of Lupus Erythematosus, Raynaudoos, Scleroderma Integumentary (Skin) Denies history of History of Burn Musculoskeletal Patient has history of Osteoarthritis Denies history of Gout, Rheumatoid Arthritis, Osteomyelitis Neurologic Denies history of Neuropathy, Quadriplegia, Paraplegia, Seizure Disorder Oncologic Patient has history of Received Chemotherapy - Breast and Ovarian Cancer Denies history of Received Radiation Hospitalization/Surgery History - diverticulits. Objective Constitutional respirations regular, non-labored and within target range for patient.. Vitals Time Taken: 10:00 AM, Height: 66 in, Weight: 176 lbs, BMI: 28.4, Temperature: 98 F, Pulse: 60 bpm, Respiratory Rate: 17  breaths/min, Blood Pressure: 117/67 mmHg. Cardiovascular 2+ dorsalis pedis/posterior tibialis pulses. Psychiatric pleasant and cooperative. General Notes: Left lower extremity: T the lateral aspect there is an open wound with granulation tissue and nonviable tissue present. No surrounding signs of o infection. Integumentary (Hair, Skin) Wound #26 status is Open. Original cause of wound was Gradually Appeared. The date acquired was: 11/20/2021. The wound has been in treatment 23 weeks. The wound is located on the Left,Lateral Ankle. The wound measures 2cm length x 1.3cm width x 0.2cm depth; 2.042cm^2 area and 0.408cm^3 volume. There is Fat Layer (Subcutaneous Tissue) exposed. There is no tunneling or undermining noted. There is a medium amount of serosanguineous drainage noted. The wound margin is distinct with the outline attached to the wound base. There is large (67-100%) red granulation within the wound bed. There is a small (1-33%) amount of necrotic tissue within the wound bed including Adherent Slough. Assessment Active Problems ICD-10 Chronic venous hypertension (idiopathic) with ulcer of left lower extremity Non-pressure chronic ulcer of other part of left lower leg with fat layer exposed Malignant neoplasm of unspecified  site of unspecified female breast Chronic combined systolic (congestive) and diastolic (congestive) heart failure Alzheimer's disease, unspecified Malignant neoplasm of endometrium Patient's wound has shown improvement in size in appearance since last clinic visit. I debrided nonviable tissue to support continued wound healing. I recommended continuing with Hydrofera Blue under 3 layer compression. She is interested in doing a free trial of vendaje which is a placental tissue skin substitute. We will reach out to the rep for this. Procedures Wound #26 Pre-procedure diagnosis of Wound #26 is a Venous Leg Ulcer located on the Left,Lateral Ankle .Severity of Tissue  Pre Debridement is: Fat layer exposed. There was a Excisional Skin/Subcutaneous Tissue Debridement with a total area of 2.6 sq cm performed by Kalman Shan, DO. With the following instrument(s): Curette to remove Viable and Non-Viable tissue/material. Material removed includes Subcutaneous Tissue and Slough and after achieving pain control using Lidocaine. No specimens were taken. A time out was conducted at 10:30, prior to the start of the procedure. A Minimum amount of bleeding was controlled with Pressure. The procedure was tolerated well with a pain level of 0 throughout and a pain level of 0 following the procedure. Post Debridement Measurements: 2cm length x 1.3cm width x 0.2cm depth; 0.408cm^3 volume. Character of Wound/Ulcer Post Debridement is improved. Severity of Tissue Post Debridement is: Fat layer exposed. Post procedure Diagnosis Wound #26: Same as Pre-Procedure Pre-procedure diagnosis of Wound #26 is a Venous Leg Ulcer located on the Left,Lateral Ankle . There was a Three Layer Compression Therapy Procedure by Rhae Hammock, RN. Post procedure Diagnosis Wound #26: Same as Pre-Procedure Plan Follow-up Appointments: Return Appointment in 2 weeks. - 05/21/22 @ 1300 w/ Dr. Heber Prue and Allayne Butcher Room # 9 ***Continue to bring keystone topical antibiotic medication to each visit.*** Bathing/ Shower/ Hygiene: May shower with protection but do not get wound dressing(s) wet. - use cast protector. May shower and wash wound with soap and water. - with dressing changes may wash with soap and water. Edema Control - Lymphedema / SCD / Other: Elevate legs to the level of the heart or above for 30 minutes daily and/or when sitting, a frequency of: Avoid standing for long periods of time. Patient to wear own compression stockings every day. - apply in the morning and remove at night to right leg. Exercise regularly Moisturize legs daily. - every night before day right leg. Additional  Orders / Instructions: Follow Nutritious Diet - High Protein Diet Home Health: No change in wound care orders this week; continue Home Health for wound care. May utilize formulary equivalent dressing for wound treatment orders unless otherwise specified. - ****FOR WEEK OF 05/10/22 HOME HEALTH TO GO OUT TWICE TO CHANGE WRAP**** Home Health for skilled wound care nursing once a week and wound center weekly. Apply keystone topical antibiotics and hydrofera blue classic as primary dressing. NO ZINC OXIDE!! Other Home Health Orders/Instructions: [facility] home health WOUND #26: - Ankle Wound Laterality: Left, Lateral Cleanser: Soap and Water (Home Health) 2 x Per Week/30 Days Discharge Instructions: May shower and wash wound with dial antibacterial soap and water prior to dressing change. Peri-Wound Care: Sween Lotion (Moisturizing lotion) (Home Health) 2 x Per Week/30 Days Discharge Instructions: Apply moisturizing lotion as directed Topical: Keystone topical Antibiotics (Home Health) 2 x Per Week/30 Days Discharge Instructions: Mix medication according to pharmacy instruction apply to wound bed. Prim Dressing: Hydrofera Blue Classic Foam, 4x4 in (Home Health) 2 x Per Week/30 Days ary Discharge Instructions: Moisten with saline apply over the  Keystone medication. Secondary Dressing: Zetuvit Plus 4x8 in [facility]) 2 x Per Week/30 Days Discharge Instructions: Apply over primary dressing as directed. Com pression Wrap: ThreePress (3 layer compression wrap) (Home Health) 2 x Per Week/30 Days Discharge Instructions: Apply three layer compression as directed. 1. In office sharp debridement 2. Hydrofera Blue under 3 layer compression 3. Follow-up in 2 weeks, patient has home health Electronic Signature(s) Signed: 05/07/2022 4:25:03 PM By: Kalman Shan DO Entered By: Kalman Shan on 05/07/2022 16:04:10 -------------------------------------------------------------------------------- HxROS  Details Patient Name: Date of Service: [name], [name] RNESTINE 05/07/2022 10:00 A M Medical Record Number: [id number] Patient Account Number: [id number] Date of Birth/Sex: Treating RN: 1925/08/14 (88 y.o. [name], [name]: Marda Stalker Other Clinician: Referring Provider: Treating Provider/Extender: Aviva Signs in Treatment: 23 Information Obtained From Patient Eyes Medical History: Negative for: Cataracts; Glaucoma; Optic Neuritis Ear/Nose/Mouth/Throat Medical History: Negative for: Chronic sinus problems/congestion; Middle ear problems Hematologic/Lymphatic Medical History: Positive for: Anemia Negative for: Hemophilia; Human Immunodeficiency Virus; Lymphedema; Sickle Cell Disease Respiratory Medical History: Negative for: Aspiration; Asthma; Chronic Obstructive Pulmonary Disease (COPD); Pneumothorax; Sleep Apnea; Tuberculosis Cardiovascular Medical History: Positive for: Congestive Heart Failure; Hypertension; Peripheral Venous Disease Negative for: Angina; Arrhythmia; Coronary Artery Disease; Hypotension; Myocardial Infarction; Peripheral Arterial Disease; Phlebitis; Vasculitis Gastrointestinal Medical History: Negative for: Cirrhosis ; Colitis; Crohns; Hepatitis A; Hepatitis B; Hepatitis C Endocrine Medical History: Negative for: Type I Diabetes; Type II Diabetes Genitourinary Medical History: Negative for: End Stage Renal Disease Immunological Medical History: Negative for: Lupus Erythematosus; Raynauds; Scleroderma Integumentary (Skin) Medical History: Negative for: History of Burn Musculoskeletal Medical History: Positive for: Osteoarthritis Negative for: Gout; Rheumatoid Arthritis; Osteomyelitis Neurologic Medical History: Negative for: Neuropathy; Quadriplegia; Paraplegia; Seizure Disorder Oncologic Medical History: Positive for: Received Chemotherapy - Breast and Ovarian Cancer Negative for:  Received Radiation Immunizations Pneumococcal Vaccine: Received Pneumococcal Vaccination: Yes Received Pneumococcal Vaccination On or After 60th Birthday: Yes Implantable Devices None Hospitalization / Surgery History Type of Hospitalization/Surgery diverticulits Family and Social History Cancer: Yes - Child; Diabetes: No; Heart Disease: No; Hereditary Spherocytosis: No; Hypertension: Yes - Child,Father; Kidney Disease: Yes - Child; Lung Disease: No; Seizures: No; Stroke: Yes - Siblings,Child; Thyroid Problems: Yes - Child; Tuberculosis: No; Never smoker; Marital Status - Widowed; Alcohol Use: Never; Drug Use: No History; Caffeine Use: Never; Financial Concerns: No; Food, Clothing or Shelter Needs: No; Support System Lacking: No; Transportation Concerns: No Electronic Signature(s) Signed: 05/07/2022 4:25:03 PM By: Kalman Shan DO Signed: 05/12/2022 4:06:51 PM By: [name] Entered By: Kalman Shan on 05/07/2022 15:58:23 -------------------------------------------------------------------------------- SuperBill Details Patient Name: Date of Service: [name], SCHEXNIDER RNESTINE 05/07/2022 Medical Record Number: [id number] Patient Account Number: [id number] Date of Birth/Sex: Treating RN: 19-Feb-1925 (88 y.o. [name], [name]: Marda Stalker Other Clinician: Referring Provider: Treating Provider/Extender: Aviva Signs in Treatment: 23 Diagnosis Coding ICD-10 Codes Code Description [phone] Chronic venous hypertension (idiopathic) with ulcer of left lower extremity L97.822 Non-pressure chronic ulcer of other part of left lower leg with fat layer exposed C50.919 Malignant neoplasm of unspecified site of unspecified female breast I50.42 Chronic combined systolic (congestive) and diastolic (congestive) heart failure G30.9 Alzheimer's disease, unspecified C54.1 Malignant neoplasm of endometrium Facility Procedures CPT4  Code: 29518841 Description: 11042 - DEB SUBQ TISSUE 20 SQ CM/< ICD-10 Diagnosis Description L97.822 Non-pressure chronic ulcer of other part of left lower leg with fat layer expo Modifier: sed Quantity: 1 Physician Procedures : CPT4 Code Description Modifier [id number] 11042 - WC PHYS SUBQ TISS  20 SQ CM 1 ICD-10 Diagnosis Description L97.822 Non-pressure chronic ulcer of other part of left lower leg with fat layer exposed Quantity: Electronic Signature(s) Signed: 05/07/2022 4:25:03 PM By: Kalman Shan DO Previous Signature: 05/07/2022 12:21:07 PM Version By: [name] Entered By: Kalman Shan on 05/07/2022 16:04:21

## 2022-05-21 ENCOUNTER — Encounter (HOSPITAL_BASED_OUTPATIENT_CLINIC_OR_DEPARTMENT_OTHER): Payer: Medicare PPO | Admitting: Internal Medicine

## 2022-05-21 DIAGNOSIS — L97822 Non-pressure chronic ulcer of other part of left lower leg with fat layer exposed: Secondary | ICD-10-CM | POA: Diagnosis not present

## 2022-05-21 DIAGNOSIS — C541 Malignant neoplasm of endometrium: Secondary | ICD-10-CM | POA: Diagnosis not present

## 2022-05-21 DIAGNOSIS — I5042 Chronic combined systolic (congestive) and diastolic (congestive) heart failure: Secondary | ICD-10-CM | POA: Diagnosis not present

## 2022-05-21 DIAGNOSIS — I110 Hypertensive heart disease with heart failure: Secondary | ICD-10-CM | POA: Diagnosis not present

## 2022-05-21 DIAGNOSIS — Z853 Personal history of malignant neoplasm of breast: Secondary | ICD-10-CM | POA: Diagnosis not present

## 2022-05-21 DIAGNOSIS — I87312 Chronic venous hypertension (idiopathic) with ulcer of left lower extremity: Secondary | ICD-10-CM | POA: Diagnosis not present

## 2022-05-21 DIAGNOSIS — G309 Alzheimer's disease, unspecified: Secondary | ICD-10-CM | POA: Diagnosis not present

## 2022-05-21 DIAGNOSIS — Z8543 Personal history of malignant neoplasm of ovary: Secondary | ICD-10-CM | POA: Diagnosis not present

## 2022-05-21 NOTE — Progress Notes (Signed)
[name], [name] ([id number]) Visit Report for 05/21/2022 Arrival Information Details Patient Name: Date of Service: [name], [name] 05/21/2022 10:00 A M Medical Record Number: [id number] Patient Account Number: [id number] Date of Birth/Sex: Treating RN: December 03, 1924 (88 y.o. [name], [name] Primary Care Anglia Blakley: Marda Stalker Other Clinician: Referring Bobbye Petti: Treating Chyanna Flock/Extender: Aviva Signs in Treatment: 25 Visit Information History Since Last Visit Added or deleted any medications: No Patient Arrived: Wheel Chair Any new allergies or adverse reactions: No Arrival Time: 10:27 Had a fall or experienced change in No Accompanied By: self activities of daily living that may affect Transfer Assistance: Manual risk of falls: Patient Identification Verified: Yes Signs or symptoms of abuse/neglect since last visito No Secondary Verification Process Completed: Yes Hospitalized since last visit: No Patient Requires Transmission-Based Precautions: No Implantable device outside of the clinic excluding No Patient Has Alerts: No cellular tissue based products placed in the center since last visit: Has Dressing in Place as Prescribed: Yes Pain Present Now: No Electronic Signature(s) Signed: 05/21/2022 11:43:29 AM By: [name] Entered By: Rhae Hammock on 05/21/2022 10:27:22 -------------------------------------------------------------------------------- Compression Therapy Details Patient Name: Date of Service: [name], [name] 05/21/2022 10:00 A M Medical Record Number: [id number] Patient Account Number: [id number] Date of Birth/Sex: Treating RN: [birth year] (88 y.o. [name], [name] Primary Care Franko Hilliker: Marda Stalker Other Clinician: Referring Jameson Morrow: Treating Ova Meegan/Extender: Marjo Bicker Weeks in Treatment: 25 Compression Therapy Performed for Wound Assessment: Wound #26  Left,Lateral Ankle Performed By: Clinician Rhae Hammock, RN Compression Type: Three Layer Post Procedure Diagnosis Same as Pre-procedure Electronic Signature(s) Signed: 05/21/2022 11:43:29 AM By: [name] Entered By: Rhae Hammock on 05/21/2022 11:03:37 -------------------------------------------------------------------------------- Encounter Discharge Information Details Patient Name: Date of Service: [name], [name] 05/21/2022 10:00 A M Medical Record Number: [id number] Patient Account Number: [id number] Date of Birth/Sex: Treating RN: 16-Jan-1925 (88 y.o. [name], [name] Primary Care Railynn Ballo: Marda Stalker Other Clinician: Referring Liron Eissler: Treating Vint Pola/Extender: Aviva Signs in Treatment: 25 Encounter Discharge Information Items Post Procedure Vitals Discharge Condition: Stable Temperature (F): 97.7 Ambulatory Status: Wheelchair Pulse (bpm): 76 Discharge Destination: Home Respiratory Rate (breaths/min): 18 Transportation: Private Auto Blood Pressure (mmHg): 147/90 Accompanied By: daughter Schedule Follow-up Appointment: Yes Clinical Summary of Care: Patient Declined Electronic Signature(s) Signed: 05/21/2022 11:43:29 AM By: [name] Entered By: Rhae Hammock on 05/21/2022 11:05:45 -------------------------------------------------------------------------------- Lower Extremity Assessment Details Patient Name: Date of Service: [name], [name] 05/21/2022 10:00 A M Medical Record Number: [id number] Patient Account Number: [id number] Date of Birth/Sex: Treating RN: [birth year] (88 y.o. [name], [name] Primary Care Nikeisha Klutz: Marda Stalker Other Clinician: Referring Clementine Soulliere: Treating Nyna Chilton/Extender: Marjo Bicker Weeks in Treatment: 25 Edema Assessment Assessed: [name]: Yes] Patrice Paradise: No] Edema: [Left: N] [Right: o] Calf Left: Right: Point of Measurement:  30 cm From Medial Instep 31.4 cm Ankle Left: Right: Point of Measurement: 11 cm From Medial Instep 20.5 cm Vascular Assessment Pulses: Dorsalis Pedis Palpable: [Left:Yes] Posterior Tibial Palpable: [Left:Yes] Electronic Signature(s) Signed: 05/21/2022 11:43:29 AM By: [name] Entered By: Rhae Hammock on 05/21/2022 10:41:33 -------------------------------------------------------------------------------- Multi Wound Chart Details Patient Name: Date of Service: [name], [name] 05/21/2022 10:00 A M Medical Record Number: [id number] Patient Account Number: [id number] Date of Birth/Sex: Treating RN: 1925/10/28 (88 y.o. [name], [name] Primary Care Tyjai Matuszak: Marda Stalker Other Clinician: Referring Noble Bodie: Treating Javonni Macke/Extender: Marjo Bicker Weeks in Treatment: 25 Vital Signs Height(in): 66 Pulse(bpm): 54 Weight(lbs): 176 Blood Pressure(mmHg): 146/74 Body Mass Index(BMI): 28.4 Temperature(F): 98.7 Respiratory  Rate(breaths/min): 17 Photos: [N/A:N/A] Left, Lateral Ankle N/A N/A Wound Location: Gradually Appeared N/A N/A Wounding Event: Venous Leg Ulcer N/A N/A Primary Etiology: Anemia, Congestive Heart Failure, N/A N/A Comorbid History: Hypertension, Peripheral Venous Disease, Osteoarthritis, Received Chemotherapy 11/20/2021 N/A N/A Date Acquired: 25 N/A N/A Weeks of Treatment: Open N/A N/A Wound Status: No N/A N/A Wound Recurrence: 1.9x1.3x0.2 N/A N/A Measurements L x W x D (cm) 1.94 N/A N/A A (cm) : rea 0.388 N/A N/A Volume (cm) : 79.40% N/A N/A % Reduction in A rea: 58.80% N/A N/A % Reduction in Volume: Full Thickness Without Exposed N/A N/A Classification: Support Structures Medium N/A N/A Exudate A mount: Serosanguineous N/A N/A Exudate Type: red, brown N/A N/A Exudate Color: Distinct, outline attached N/A N/A Wound Margin: Large (67-100%) N/A N/A Granulation A mount: Red N/A  N/A Granulation Quality: Small (1-33%) N/A N/A Necrotic A mount: Fat Layer (Subcutaneous Tissue): Yes N/A N/A Exposed Structures: Fascia: No Tendon: No Muscle: No Joint: No Bone: No Small (1-33%) N/A N/A Epithelialization: Debridement - Excisional N/A N/A Debridement: Pre-procedure Verification/Time Out 11:00 N/A N/A Taken: Lidocaine N/A N/A Pain Control: Subcutaneous, Slough N/A N/A Tissue Debrided: Skin/Subcutaneous Tissue N/A N/A Level: 2.47 N/A N/A Debridement A (sq cm): rea Curette N/A N/A Instrument: Minimum N/A N/A Bleeding: Pressure N/A N/A Hemostasis A chieved: 0 N/A N/A Procedural Pain: 0 N/A N/A Post Procedural Pain: Procedure was tolerated well N/A N/A Debridement Treatment Response: 1.9x1.3x0.2 N/A N/A Post Debridement Measurements L x W x D (cm) 0.388 N/A N/A Post Debridement Volume: (cm) Compression Therapy N/A N/A Procedures Performed: Debridement Treatment Notes Wound #26 (Ankle) Wound Laterality: Left, Lateral Cleanser Soap and Water Discharge Instruction: May shower and wash wound with dial antibacterial soap and water prior to dressing change. Peri-Wound Care Sween Lotion (Moisturizing lotion) Discharge Instruction: Apply moisturizing lotion as directed Topical Keystone topical Antibiotics Discharge Instruction: Mix medication according to pharmacy instruction apply to wound bed. Primary Dressing Hydrofera Blue Classic Foam, 4x4 in Discharge Instruction: Moisten with saline apply over the [facility] medication. Secondary Dressing Zetuvit Plus 4x8 in Discharge Instruction: Apply over primary dressing as directed. Secured With Compression Wrap ThreePress (3 layer compression wrap) Discharge Instruction: Apply three layer compression as directed. Compression Stockings Add-Ons Electronic Signature(s) Signed: 05/21/2022 11:26:20 AM By: Kalman Shan DO Signed: 05/21/2022 11:43:29 AM By: [name] Entered By: Kalman Shan on 05/21/2022 11:22:30 -------------------------------------------------------------------------------- Multi-Disciplinary Care Plan Details Patient Name: Date of Service: [name], HUEBERT RNESTINE 05/21/2022 10:00 A M Medical Record Number: [id number] Patient Account Number: [id number] Date of Birth/Sex: Treating RN: 04-24-1925 (88 y.o. [name], [name] Primary Care Maxime Beckner: Marda Stalker Other Clinician: Referring Adhya Cocco: Treating Hawkins Seaman/Extender: Aviva Signs in Treatment: 25 Active Inactive Nutrition Nursing Diagnoses: Imbalanced nutrition Goals: Patient/caregiver agrees to and verbalizes understanding of need to use nutritional supplements and/or vitamins as prescribed Date Initiated: 11/27/2021 Target Resolution Date: 05/22/2022 Goal Status: Active Interventions: Assess patient nutrition upon admission and as needed per policy Provide education on nutrition Treatment Activities: Education provided on Nutrition : 05/07/2022 Notes: Wound/Skin Impairment Nursing Diagnoses: Impaired tissue integrity Goals: Patient/caregiver will verbalize understanding of skin care regimen Date Initiated: 11/27/2021 Target Resolution Date: 05/22/2022 Goal Status: Active Ulcer/skin breakdown will have a volume reduction of 30% by week 4 Date Initiated: 11/27/2021 Date Inactivated: 12/31/2021 Target Resolution Date: 12/25/2021 Unmet Reason: according to Goal Status: Unmet measurements has not reduced. Interventions: Assess patient/caregiver ability to obtain necessary supplies Assess patient/caregiver ability to perform ulcer/skin care regimen upon admission and  as needed Assess ulceration(s) every visit Provide education on ulcer and skin care Treatment Activities: Topical wound management initiated : 11/27/2021 Notes: Electronic Signature(s) Signed: 05/21/2022 11:43:29 AM By: [name] Entered By: Rhae Hammock on 05/21/2022  10:56:11 -------------------------------------------------------------------------------- Pain Assessment Details Patient Name: Date of Service: [name], [name] 05/21/2022 10:00 A M Medical Record Number: [id number] Patient Account Number: [id number] Date of Birth/Sex: Treating RN: 1925-08-25 (88 y.o. [name], [name] Primary Care Kisa Fujii: Marda Stalker Other Clinician: Referring Korey Prashad: Treating Eldonna Neuenfeldt/Extender: Marjo Bicker Weeks in Treatment: 25 Active Problems Location of Pain Severity and Description of Pain Patient Has Paino No Site Locations Pain Management and Medication Current Pain Management: Electronic Signature(s) Signed: 05/21/2022 11:43:29 AM By: [name] Entered By: Rhae Hammock on 05/21/2022 10:41:27 -------------------------------------------------------------------------------- Patient/Caregiver Education Details Patient Name: Date of Service: Thalia Party RNESTINE 6/30/2023andnbsp10:00 [place] Record Number: [id number] Patient Account Number: [id number] Date of Birth/Gender: Treating RN: April 28, 1925 (88 y.o. [name], [name] Primary Care Physician: Marda Stalker Other Clinician: Referring Physician: Treating Physician/Extender: Aviva Signs in Treatment: 25 Education Assessment Education Provided To: Patient Education Topics Provided Wound/Skin Impairment: Methods: Explain/Verbal Responses: Reinforcements needed, State content correctly [employer]) Signed: 05/21/2022 11:43:29 AM By: [name] Entered By: Rhae Hammock on 05/21/2022 10:56:22 -------------------------------------------------------------------------------- Wound Assessment Details Patient Name: Date of Service: [name], [name] 05/21/2022 10:00 A M Medical Record Number: [id number] Patient Account Number: [id number] Date of Birth/Sex: Treating RN: 1925/09/28 (88 y.o.  [name], [name] Primary Care [name] Sturgill: Marda Stalker Other Clinician: Referring Dolly Harbach: Treating Makinsley Schiavi/Extender: Marjo Bicker Weeks in Treatment: 25 Wound Status Wound Number: 26 Primary Venous Leg Ulcer Etiology: Wound Location: Left, Lateral Ankle Wound Open Wounding Event: Gradually Appeared Status: Date Acquired: 11/20/2021 Comorbid Anemia, Congestive Heart Failure, Hypertension, Peripheral Weeks Of Treatment: 25 History: Venous Disease, Osteoarthritis, Received Chemotherapy Clustered Wound: No Photos Wound Measurements Length: (cm) 1.9 Width: (cm) 1.3 Depth: (cm) 0.2 Area: (cm) 1.94 Volume: (cm) 0.388 % Reduction in Area: 79.4% % Reduction in Volume: 58.8% Epithelialization: Small (1-33%) Tunneling: No Undermining: No Wound Description Classification: Full Thickness Without Exposed Support Structures Wound Margin: Distinct, outline attached Exudate Amount: Medium Exudate Type: Serosanguineous Exudate Color: red, brown Foul Odor After Cleansing: No Slough/Fibrino Yes Wound Bed Granulation Amount: Large (67-100%) Exposed Structure Granulation Quality: Red Fascia Exposed: No Necrotic Amount: Small (1-33%) Fat Layer (Subcutaneous Tissue) Exposed: Yes Necrotic Quality: Adherent Slough Tendon Exposed: No Muscle Exposed: No Joint Exposed: No Bone Exposed: No Treatment Notes Wound #26 (Ankle) Wound Laterality: Left, Lateral Cleanser Soap and Water Discharge Instruction: May shower and wash wound with dial antibacterial soap and water prior to dressing change. Peri-Wound Care Sween Lotion (Moisturizing lotion) Discharge Instruction: Apply moisturizing lotion as directed Topical Keystone topical Antibiotics Discharge Instruction: Mix medication according to pharmacy instruction apply to wound bed. Primary Dressing Hydrofera Blue Classic Foam, 4x4 in Discharge Instruction: Moisten with saline apply over the [facility]  medication. Secondary Dressing Zetuvit Plus 4x8 in Discharge Instruction: Apply over primary dressing as directed. Secured With Compression Wrap ThreePress (3 layer compression wrap) Discharge Instruction: Apply three layer compression as directed. Compression Stockings Add-Ons Electronic Signature(s) Signed: 05/21/2022 11:43:29 AM By: [name] Signed: 05/21/2022 12:19:58 PM By: Deon Pilling RN, BSN Entered By: Deon Pilling on 05/21/2022 10:45:12 -------------------------------------------------------------------------------- Vitals Details Patient Name: Date of Service: [name], [name] 05/21/2022 10:00 A M Medical Record Number: [id number] Patient Account Number: [id number] Date of Birth/Sex: Treating RN: 07/21/1925 (88 y.o. F) Hollie Salk,  [name] Primary Care Montoya Watkin: Marda Stalker Other Clinician: Referring Korissa Horsford: Treating Deyjah Kindel/Extender: Aviva Signs in Treatment: 25 Vital Signs Time Taken: 10:41 Temperature (F): 98.7 Height (in): 66 Pulse (bpm): 54 Weight (lbs): 176 Respiratory Rate (breaths/min): 17 Body Mass Index (BMI): 28.4 Blood Pressure (mmHg): 146/74 Reference Range: 80 - 120 mg / dl Electronic Signature(s) Signed: 05/21/2022 11:43:29 AM By: [name] Entered By: Rhae Hammock on 05/21/2022 10:41:20

## 2022-05-21 NOTE — Progress Notes (Signed)
[name] ([id number]) Visit Report for 05/21/2022 Chief Complaint Document Details Patient Name: Date of Service: [name], [name] 05/21/2022 10:00 A M Medical Record Number: [id number] Patient Account Number: [id number] Date of Birth/Sex: Treating RN: 1925/08/15 (88 y.o. [name], [name] Primary Care Provider: Marda [name] Other Clinician: Referring Provider: Treating Provider/Extender: [name] Signs in Treatment: 25 Information Obtained from: Patient Chief Complaint Left lower extremity wound Electronic Signature(s) Signed: 05/21/2022 11:26:20 AM By: [name] Shan DO Entered By: [name] on 05/21/2022 11:22:37 -------------------------------------------------------------------------------- Debridement Details Patient Name: Date of Service: [name] [name] 05/21/2022 10:00 [place] Record Number: [id number] Patient Account Number: [id number] Date of Birth/Sex: Treating RN: 1925-09-14 (88 y.o. [name], [name] Primary Care Provider: Marda [name] Other Clinician: Referring Provider: Treating Provider/Extender: [name] Signs in Treatment: 25 Debridement Performed for Assessment: Wound #26 Left,Lateral Ankle Performed By: Physician [name] Shan, DO Debridement Type: Debridement Severity of Tissue Pre Debridement: Fat layer exposed Level of Consciousness (Pre-procedure): Awake and Alert Pre-procedure Verification/Time Out Yes - 11:00 Taken: Start Time: 11:00 Pain Control: Lidocaine T Area Debrided (L x W): otal 1.9 (cm) x 1.3 (cm) = 2.47 (cm) Tissue and other material debrided: Viable, Non-Viable, Slough, Subcutaneous, Slough Level: Skin/Subcutaneous Tissue Debridement Description: Excisional Instrument: Curette Bleeding: Minimum Hemostasis Achieved: Pressure End Time: 11:00 Procedural Pain: 0 Post Procedural Pain: 0 Response to Treatment: Procedure was tolerated well Level  of Consciousness (Post- Awake and Alert procedure): Post Debridement Measurements of Total Wound Length: (cm) 1.9 Width: (cm) 1.3 Depth: (cm) 0.2 Volume: (cm) 0.388 Character of Wound/Ulcer Post Debridement: Improved Severity of Tissue Post Debridement: Fat layer exposed Post Procedure Diagnosis Same as Pre-procedure Electronic Signature(s) Signed: 05/21/2022 11:26:20 AM By: [name] Shan DO Signed: 05/21/2022 11:43:29 AM By: [name] Hammock RN Entered By: [name] on 05/21/2022 11:04:37 -------------------------------------------------------------------------------- HPI Details Patient Name: Date of Service: [name], [name] 05/21/2022 10:00 [place] Record Number: [id number] Patient Account Number: [id number] Date of Birth/Sex: Treating RN: October 20, 1925 (88 y.o. [name], [name] Primary Care Provider: Marda [name] Other Clinician: Referring Provider: Treating Provider/Extender: [name] Signs in Treatment: 25 History of Present Illness HPI Description: this is a patient we know from several prior wounds on her bilateral lower extremities. She has venous stasis physiology, inflammation and hypertension. She is been fully evaluated for venous ablation and was found not to be a candidate. She does not have significant arterial disease. 02/14/15 the wound itself on her lateral left leg did not look too bad however there was surrounding maceration which was concerning 02/27/15 the wound itself is small with surrounding circumferential epithelialization there is no surrounding maceration. 03/06/15 only a very small area remains. I think this is mostly epithelialized however I would be uncomfortable not dressing this this week 03/13/15 the area is epithelialized. There was a thick surface on this. I took a #15 blade then lightly disrupted it but there does not appear to be any open area I did not see anything but appropriate  epithelium Readmission: 08/16/18 on evaluation today patient presents for initial evaluation and our clinic released readmission although she has not been seen since April 2016. Nonetheless she is having issues with the ulcers currently on the bilateral lateral malleolus or locations as well as the left lateral lower extremity. These have been present for roughly 3 weeks since the patient actually developed significant bilateral lower extremity edema which patient's daughter states was roughly 3 times the size of her legs currently. Subsequently she  ended up in the emerging department due to congestive heart failure. They were able to get the swelling under control and fortunately she seems to be doing much better at this point in time. She was placed in an [employer] which she sat on for week part evaluation today as well. Fortunately the wound areas actually seem to be doing fairly well there is some macerated skin surrounding there are the areas/surface of the wound which are gonna require some debridement at this point. No fevers, chills, nausea, or vomiting noted at this time. Patient has a history of hypertension, mild peripheral vascular disease, chronic venous stasis, and other than the ulcer seems to be doing fairly well today. No fevers, chills, nausea, or vomiting noted at this time. 08/23/18 on evaluation today patient actually appears to be doing great in regard to her bilateral lower should be ulcers. She's shown signs of improvement even just with one week with the [employer]. In general I'm actually very pleased with how things appear at this point. 09/06/18 on evaluation today patient actually appears to be doing much better her left lower extremity is completely healed. Go to the right lower extremity lateral malleolus ulcer this still seems to be showing signs of being open. There does not appear to be evidence of infection which is good news. This did require some sharp  debridement however to remove some of the necrotic tissue/eschar on the surface of the wound Readmission 11/27/2021 [name] is a 87 year old female with a past medical history of endometrial and breast cancer, chronic venous insufficiency, dementia and essential hypertension that presents to the clinic with a 1 month history of nonhealing ulcer to the left lower extremity. She states that the ulcer opened 1 month ago and then healed but has reopened again in the past week. She is currently keeping the area covered. She has a history of wounds to her lower extremities. She has compression stockings that she uses daily. She currently denies signs of infection. 1/13; patient presents for follow-up. She followed up for nurse visit for wrap change. She reports more odor. She denies pain. 1/20; patient presents for follow-up. She has been using gentamicin ointment with calcium alginate to the wound bed. She reports improvement in odor and drainage. She currently denies systemic signs of infection. 1/27; patient presents for follow-up. She has been using gentamicin ointment with calcium alginate. She received her Keystone antibiotic 2 days ago. She brought this in. Home health has also been established. They will come out for the first time next week. She currently denies signs of infection. She has no issues or complaints today. 2/2; patient presents for follow-up. She has been using Keystone antibiotics with [facility] with no issues. She denies signs of infection. She has no issues or complaints today. 2/9; patient presents for follow-up. She has been using Keystone antibiotics with [facility] with no issues. She has tolerated the compression wrap well. She denies signs of infection. 2/16; patient presents for follow-up. She has been using Keystone antibiotics with Hydrofera Blue under compression wrap with no issues. She currently denies signs of infection. 2/23; patient presents  for follow-up. She continues to use Keystone antibiotics with Hydrofera Blue under the compression wrap. She has no issues or complaints today. She denies signs of infection. Home health continues to come out and change the wrap. 3/2; patient presents for follow-up. She continues to use [facility] with [facility] under compression with no issues. She denies signs  of infection. 3/16; patient presents for follow-up. She continues to use [facility] with [facility] under compression with no issues. Home health change the dressing last week. She denies signs of infection. 3/23; patient presents for follow-up. She continues to use Keystone antibiotics with Hydrofera Blue under compression with no issues. Home health continues to change the dressings. She denies signs of infection. 3/30; patient presents for follow-up. She continues to use Keystone antibiotics with Hydrofera Blue under compression with no issues. Home health continues to change the dressings. 4/13 2-week follow-up. Keystone and Hydrofera Blue wound is roughly 2.3 cm in diameter smaller. No other issues 4/20; patient presents for follow-up. She continues to use Keystone antibiotic with [facility] with no issues. 4/28; patient with a difficult wound on her right medial lower leg. Using [facility] Blue minimal improvement per our intake nurse we are using compression. She has home health changing the dressing 5/5; patient presents for follow-up. We have been using Keystone antibiotics and Hydrofera Blue under compression therapy. She has no issues or complaints today. We discussed potentially using an advanced tissue product. She states she has had this done in the past and would like to proceed to see what her insurance would cover. 5/15; patient presents for follow-up. She has been using Keystone antibiotics and Hydrofera Blue under compression therapy. She forgot her Keystone antibiotics today. Insurance has not  approved her for skin substitute. She currently denies signs of infection. She has home health that changes the dressing once a week. 5/19; patient presents for follow-up. We have been using Keystone antibiotic and Hydrofera Blue under compression therapy. She has no issues or complaints today. The [employer] rep confirmed over the phone that the patient has been 100% approved for their skin substitute. We will verify this as we have not received any paperwork stating this. Patient states she would like to proceed with a skin substitute if it is covered by insurance. 6/1; patient presents for follow-up. We have been using Keystone antibiotic and Hydrofera Blue under compression therapy. We confirmed that she does have a co-pay for the [employer] skin substitute. We will hold off on using this for now since she continues to show improvement with current therapy. She denies signs of infection. She has home health that comes out twice weekly. 6/16; patient presents for follow-up. We have been using Keystone antibiotic and Hydrofera Blue under compression therapy. She has no issues or complaints today. We discussed the potential trial of vendaje. This is placenta tissue. She states she would like to try the free trial. We will reach out to our rep. 6/30; patient presents for follow-up. She has been using Keystone antibiotic and Hydrofera Blue under compression therapy. She has been approved for a trial of vendaje. She would like to proceed with this and We will have this available at next clinic visit. She has no issues or complaints today. Electronic Signature(s) Signed: 05/21/2022 11:26:20 AM By: [name] Shan DO Entered By: [name] on 05/21/2022 11:23:31 -------------------------------------------------------------------------------- Physical Exam Details Patient Name: Date of Service: [name], [name] 05/21/2022 10:00 A M Medical Record Number: [id number] Patient Account Number:  [id number] Date of Birth/Sex: Treating RN: 06/03/1925 (88 y.o. [name], [name] Primary Care Provider: Marda [name] Other Clinician: Referring Provider: Treating Provider/Extender: Marjo Bicker Weeks in Treatment: 25 Constitutional respirations regular, non-labored and within target range for patient.. Cardiovascular 2+ dorsalis pedis/posterior tibialis pulses. Psychiatric pleasant and cooperative. Notes Left lower extremity: T the lateral aspect there is an  open wound with granulation tissue and nonviable tissue present. No surrounding signs of infection. o Electronic Signature(s) Signed: 05/21/2022 11:26:20 AM By: [name] Shan DO Entered By: [name] on 05/21/2022 11:24:00 -------------------------------------------------------------------------------- Physician Orders Details Patient Name: Date of Service: [name], [name] 05/21/2022 10:00 A M Medical Record Number: [id number] Patient Account Number: [id number] Date of Birth/Sex: Treating RN: 11-05-1925 (88 y.o. [name], [name] Primary Care Provider: Marda [name] Other Clinician: Referring Provider: Treating Provider/Extender: [name] Signs in Treatment: 26 Verbal / Phone Orders: No Diagnosis Coding Follow-up Appointments ppointment in 2 weeks. - 06/04/22@ 1000 w/ Dr. Heber Alhambra and Allayne Butcher Room # 9 Return A ***Continue to bring keystone topical antibiotic medication to each visit.*** Bathing/ Shower/ Hygiene May shower with protection but do not get wound dressing(s) wet. - use cast protector. May shower and wash wound with soap and water. - with dressing changes may wash with soap and water. Edema Control - Lymphedema / SCD / Other Elevate legs to the level of the heart or above for 30 minutes daily and/or when sitting, a frequency of: Avoid standing for long periods of time. Patient to wear own compression stockings every day. - apply in the  [name] and remove at night to right leg. Exercise regularly Moisturize legs daily. - every night before day right leg. Additional Orders / Instructions Follow Nutritious Diet - High Protein Diet Home Health No change in wound care orders this week; continue Home Health for wound care. May utilize formulary equivalent dressing for wound treatment orders unless otherwise specified. - Home Health for skilled wound care nursing twice a week and wound center every other week Apply keystone topical antibiotics and hydrofera blue classic as primary dressing. NO ZINC OXIDE!! Other Home Health Orders/Instructions: [facility] home health Wound Treatment Wound #26 - Ankle Wound Laterality: Left, Lateral Cleanser: Soap and Water (Home Health) 2 x Per Week/30 Days Discharge Instructions: May shower and wash wound with dial antibacterial soap and water prior to dressing change. Peri-Wound Care: Sween Lotion (Moisturizing lotion) (Home Health) 2 x Per Week/30 Days Discharge Instructions: Apply moisturizing lotion as directed Topical: Keystone topical Antibiotics (Home Health) 2 x Per Week/30 Days Discharge Instructions: Mix medication according to pharmacy instruction apply to wound bed. Prim Dressing: Hydrofera Blue Classic Foam, 4x4 in (Home Health) 2 x Per Week/30 Days ary Discharge Instructions: Moisten with saline apply over the [facility] medication. Secondary Dressing: Zetuvit Plus 4x8 in [facility]) 2 x Per Week/30 Days Discharge Instructions: Apply over primary dressing as directed. Compression Wrap: ThreePress (3 layer compression wrap) (Home Health) 2 x Per Week/30 Days Discharge Instructions: Apply three layer compression as directed. Electronic Signature(s) Signed: 05/21/2022 11:26:20 AM By: [name] Shan DO Entered By: [name] on 05/21/2022 11:24:09 -------------------------------------------------------------------------------- Problem List Details Patient Name: Date of  Service: [name], [name] 05/21/2022 10:00 A M Medical Record Number: [id number] Patient Account Number: [id number] Date of Birth/Sex: Treating RN: 11-18-1925 (88 y.o. [name], [name] Primary Care Provider: Marda [name] Other Clinician: Referring Provider: Treating Provider/Extender: [name] Signs in Treatment: 25 Active Problems ICD-10 Encounter Code Description Active Date MDM Diagnosis I87.312 Chronic venous hypertension (idiopathic) with ulcer of left lower extremity 11/27/2021 No Yes L97.822 Non-pressure chronic ulcer of other part of left lower leg with fat layer exposed1/04/2022 No Yes C50.919 Malignant neoplasm of unspecified site of unspecified female breast 11/27/2021 No Yes I50.42 Chronic combined systolic (congestive) and diastolic (congestive) heart failure 11/27/2021 No Yes G30.9 Alzheimer's disease, unspecified 11/27/2021 No  Yes C54.1 Malignant neoplasm of endometrium 11/27/2021 No Yes Inactive Problems Resolved Problems Electronic Signature(s) Signed: 05/21/2022 11:26:20 AM By: [name] Shan DO Entered By: [name] on 05/21/2022 11:22:26 -------------------------------------------------------------------------------- Progress Note Details Patient Name: Date of Service: [name], [name] 05/21/2022 10:00 A M Medical Record Number: [id number] Patient Account Number: [id number] Date of Birth/Sex: Treating RN: 1925/01/26 (88 y.o. [name], [name] Primary Care Provider: Marda [name] Other Clinician: Referring Provider: Treating Provider/Extender: [name] Signs in Treatment: 25 Subjective Chief Complaint Information obtained from Patient Left lower extremity wound History of Present Illness (HPI) this is a patient we know from several prior wounds on her bilateral lower extremities. She has venous stasis physiology, inflammation and hypertension. She is been fully evaluated for venous  ablation and was found not to be a candidate. She does not have significant arterial disease. 02/14/15 the wound itself on her lateral left leg did not look too bad however there was surrounding maceration which was concerning 02/27/15 the wound itself is small with surrounding circumferential epithelialization there is no surrounding maceration. 03/06/15 only a very small area remains. I think this is mostly epithelialized however I would be uncomfortable not dressing this this week 03/13/15 the area is epithelialized. There was a thick surface on this. I took a #15 blade then lightly disrupted it but there does not appear to be any open area I did not see anything but appropriate epithelium Readmission: 08/16/18 on evaluation today patient presents for initial evaluation and our clinic released readmission although she has not been seen since April 2016. Nonetheless she is having issues with the ulcers currently on the bilateral lateral malleolus or locations as well as the left lateral lower extremity. These have been present for roughly 3 weeks since the patient actually developed significant bilateral lower extremity edema which patient's daughter states was roughly 3 times the size of her legs currently. Subsequently she ended up in the emerging department due to congestive heart failure. They were able to get the swelling under control and fortunately she seems to be doing much better at this point in time. She was placed in an [employer] which she sat on for week part evaluation today as well. Fortunately the wound areas actually seem to be doing fairly well there is some macerated skin surrounding there are the areas/surface of the wound which are gonna require some debridement at this point. No fevers, chills, nausea, or vomiting noted at this time. Patient has a history of hypertension, mild peripheral vascular disease, chronic venous stasis, and other than the ulcer seems to be doing fairly  well today. No fevers, chills, nausea, or vomiting noted at this time. 08/23/18 on evaluation today patient actually appears to be doing great in regard to her bilateral lower should be ulcers. She's shown signs of improvement even just with one week with the [employer]. In general I'm actually very pleased with how things appear at this point. 09/06/18 on evaluation today patient actually appears to be doing much better her left lower extremity is completely healed. Go to the right lower extremity lateral malleolus ulcer this still seems to be showing signs of being open. There does not appear to be evidence of infection which is good news. This did require some sharp debridement however to remove some of the necrotic tissue/eschar on the surface of the wound Readmission 11/27/2021 Ms. Tijuana Scheidegger is a 88 year old female with a past medical history of endometrial and breast cancer, chronic venous  insufficiency, dementia and essential hypertension that presents to the clinic with a 1 month history of nonhealing ulcer to the left lower extremity. She states that the ulcer opened 1 month ago and then healed but has reopened again in the past week. She is currently keeping the area covered. She has a history of wounds to her lower extremities. She has compression stockings that she uses daily. She currently denies signs of infection. 1/13; patient presents for follow-up. She followed up for nurse visit for wrap change. She reports more odor. She denies pain. 1/20; patient presents for follow-up. She has been using gentamicin ointment with calcium alginate to the wound bed. She reports improvement in odor and drainage. She currently denies systemic signs of infection. 1/27; patient presents for follow-up. She has been using gentamicin ointment with calcium alginate. She received her Keystone antibiotic 2 days ago. She brought this in. Home health has also been established. They will come out for  the first time next week. She currently denies signs of infection. She has no issues or complaints today. 2/2; patient presents for follow-up. She has been using Keystone antibiotics with [facility] with no issues. She denies signs of infection. She has no issues or complaints today. 2/9; patient presents for follow-up. She has been using Keystone antibiotics with [facility] with no issues. She has tolerated the compression wrap well. She denies signs of infection. 2/16; patient presents for follow-up. She has been using Keystone antibiotics with Hydrofera Blue under compression wrap with no issues. She currently denies signs of infection. 2/23; patient presents for follow-up. She continues to use Keystone antibiotics with Hydrofera Blue under the compression wrap. She has no issues or complaints today. She denies signs of infection. Home health continues to come out and change the wrap. 3/2; patient presents for follow-up. She continues to use [facility] with [facility] under compression with no issues. She denies signs of infection. 3/16; patient presents for follow-up. She continues to use [facility] with [facility] under compression with no issues. Home health change the dressing last week. She denies signs of infection. 3/23; patient presents for follow-up. She continues to use Keystone antibiotics with Hydrofera Blue under compression with no issues. Home health continues to change the dressings. She denies signs of infection. 3/30; patient presents for follow-up. She continues to use Keystone antibiotics with Hydrofera Blue under compression with no issues. Home health continues to change the dressings. 4/13 2-week follow-up. Keystone and Hydrofera Blue wound is roughly 2.3 cm in diameter smaller. No other issues 4/20; patient presents for follow-up. She continues to use Keystone antibiotic with [facility] with no issues. 4/28; patient with a difficult wound on her right  medial lower leg. Using [facility] Blue minimal improvement per our intake nurse we are using compression. She has home health changing the dressing 5/5; patient presents for follow-up. We have been using Keystone antibiotics and Hydrofera Blue under compression therapy. She has no issues or complaints today. We discussed potentially using an advanced tissue product. She states she has had this done in the past and would like to proceed to see what her insurance would cover. 5/15; patient presents for follow-up. She has been using Keystone antibiotics and Hydrofera Blue under compression therapy. She forgot her Keystone antibiotics today. Insurance has not approved her for skin substitute. She currently denies signs of infection. She has home health that changes the dressing once a week. 5/19; patient presents for follow-up. We have been using Keystone antibiotic  and Hydrofera Blue under compression therapy. She has no issues or complaints today. The [employer] rep confirmed over the phone that the patient has been 100% approved for their skin substitute. We will verify this as we have not received any paperwork stating this. Patient states she would like to proceed with a skin substitute if it is covered by insurance. 6/1; patient presents for follow-up. We have been using Keystone antibiotic and Hydrofera Blue under compression therapy. We confirmed that she does have a co-pay for the [employer] skin substitute. We will hold off on using this for now since she continues to show improvement with current therapy. She denies signs of infection. She has home health that comes out twice weekly. 6/16; patient presents for follow-up. We have been using Keystone antibiotic and Hydrofera Blue under compression therapy. She has no issues or complaints today. We discussed the potential trial of vendaje. This is placenta tissue. She states she would like to try the free trial. We will reach out to our  rep. 6/30; patient presents for follow-up. She has been using Keystone antibiotic and Hydrofera Blue under compression therapy. She has been approved for a trial of vendaje. She would like to proceed with this and We will have this available at next clinic visit. She has no issues or complaints today. Patient History Information obtained from Patient. Family History Cancer - Child, Hypertension - Child,Father, Kidney Disease - Child, Stroke - Siblings,Child, Thyroid Problems - Child, No family history of Diabetes, Heart Disease, Hereditary Spherocytosis, Lung Disease, Seizures, Tuberculosis. Social History Never smoker, Marital Status - Widowed, Alcohol Use - Never, Drug Use - No History, Caffeine Use - Never. Medical History Eyes Denies history of Cataracts, Glaucoma, Optic Neuritis Ear/Nose/Mouth/Throat Denies history of Chronic sinus problems/congestion, Middle ear problems Hematologic/Lymphatic Patient has history of Anemia Denies history of Hemophilia, Human Immunodeficiency Virus, Lymphedema, Sickle Cell Disease Respiratory Denies history of Aspiration, Asthma, Chronic Obstructive Pulmonary Disease (COPD), Pneumothorax, Sleep Apnea, Tuberculosis Cardiovascular Patient has history of Congestive Heart Failure, Hypertension, Peripheral Venous Disease Denies history of Angina, Arrhythmia, Coronary Artery Disease, Hypotension, Myocardial Infarction, Peripheral Arterial Disease, Phlebitis, Vasculitis Gastrointestinal Denies history of Cirrhosis , Colitis, Crohnoos, Hepatitis A, Hepatitis B, Hepatitis C Endocrine Denies history of Type I Diabetes, Type II Diabetes Genitourinary Denies history of End Stage Renal Disease Immunological Denies history of Lupus Erythematosus, Raynaudoos, Scleroderma Integumentary (Skin) Denies history of History of Burn Musculoskeletal Patient has history of Osteoarthritis Denies history of Gout, Rheumatoid Arthritis, Osteomyelitis Neurologic Denies  history of Neuropathy, Quadriplegia, Paraplegia, Seizure Disorder Oncologic Patient has history of Received Chemotherapy - Breast and Ovarian Cancer Denies history of Received Radiation Hospitalization/Surgery History - diverticulits. Objective Constitutional respirations regular, non-labored and within target range for patient.. Vitals Time Taken: 10:41 AM, Height: 66 in, Weight: 176 lbs, BMI: 28.4, Temperature: 98.7 F, Pulse: 54 bpm, Respiratory Rate: 17 breaths/min, Blood Pressure: 146/74 mmHg. Cardiovascular 2+ dorsalis pedis/posterior tibialis pulses. Psychiatric pleasant and cooperative. General Notes: Left lower extremity: T the lateral aspect there is an open wound with granulation tissue and nonviable tissue present. No surrounding signs of o infection. Integumentary (Hair, Skin) Wound #26 status is Open. Original cause of wound was Gradually Appeared. The date acquired was: 11/20/2021. The wound has been in treatment 25 weeks. The wound is located on the Left,Lateral Ankle. The wound measures 1.9cm length x 1.3cm width x 0.2cm depth; 1.94cm^2 area and 0.388cm^3 volume. There is Fat Layer (Subcutaneous Tissue) exposed. There is no tunneling or undermining noted. There is  a medium amount of serosanguineous drainage noted. The wound margin is distinct with the outline attached to the wound base. There is large (67-100%) red granulation within the wound bed. There is a small (1-33%) amount of necrotic tissue within the wound bed including Adherent Slough. Assessment Active Problems ICD-10 Chronic venous hypertension (idiopathic) with ulcer of left lower extremity Non-pressure chronic ulcer of other part of left lower leg with fat layer exposed Malignant neoplasm of unspecified site of unspecified female breast Chronic combined systolic (congestive) and diastolic (congestive) heart failure Alzheimer's disease, unspecified Malignant neoplasm of endometrium Patient's wound has  shown improvement in size in appearance since last clinic visit. I debrided nonviable tissue. I recommended continuing with Keystone antibiotic and Hydrofera Blue under compression therapy. Patient has home health. She will follow back up with [place] in 2 weeks and at that time we will place the skin substitute Procedures Wound #26 Pre-procedure diagnosis of Wound #26 is a Venous Leg Ulcer located on the Left,Lateral Ankle .Severity of Tissue Pre Debridement is: Fat layer exposed. There was a Excisional Skin/Subcutaneous Tissue Debridement with a total area of 2.47 sq cm performed by [name] Shan, DO. With the following instrument(s): Curette to remove Viable and Non-Viable tissue/material. Material removed includes Subcutaneous Tissue and Slough and after achieving pain control using Lidocaine. No specimens were taken. A time out was conducted at 11:00, prior to the start of the procedure. A Minimum amount of bleeding was controlled with Pressure. The procedure was tolerated well with a pain level of 0 throughout and a pain level of 0 following the procedure. Post Debridement Measurements: 1.9cm length x 1.3cm width x 0.2cm depth; 0.388cm^3 volume. Character of Wound/Ulcer Post Debridement is improved. Severity of Tissue Post Debridement is: Fat layer exposed. Post procedure Diagnosis Wound #26: Same as Pre-Procedure Pre-procedure diagnosis of Wound #26 is a Venous Leg Ulcer located on the Left,Lateral Ankle . There was a Three Layer Compression Therapy Procedure by [name] Hammock, RN. Post procedure Diagnosis Wound #26: Same as Pre-Procedure Plan Follow-up Appointments: Return Appointment in 2 weeks. - 06/04/22@ 1000 w/ Dr. He[place] and Allayne Butcher Room # 9 ***Continue to bring keystone topical antibiotic medication to each visit.*** Bathing/ Shower/ Hygiene: May shower with protection but do not get wound dressing(s) wet. - use cast protector. May shower and wash wound with soap and water. -  with dressing changes may wash with soap and water. Edema Control - Lymphedema / SCD / Other: Elevate legs to the level of the heart or above for 30 minutes daily and/or when sitting, a frequency of: Avoid standing for long periods of time. Patient to wear own compression stockings every day. - apply in the [name] and remove at night to right leg. Exercise regularly Moisturize legs daily. - every night before day right leg. Additional Orders / Instructions: Follow Nutritious Diet - High Protein Diet Home Health: No change in wound care orders this week; continue Home Health for wound care. May utilize formulary equivalent dressing for wound treatment orders unless otherwise specified. - Home Health for skilled wound care nursing twice a week and wound center every other week Apply keystone topical antibiotics and hydrofera blue classic as primary dressing. NO ZINC OXIDE!! Other Home Health Orders/Instructions: [facility] home health WOUND #26: - Ankle Wound Laterality: Left, Lateral Cleanser: Soap and Water (Home Health) 2 x Per Week/30 Days Discharge Instructions: May shower and wash wound with dial antibacterial soap and water prior to dressing change. Peri-Wound Care: Sween Lotion (Moisturizing lotion) (  Home Health) 2 x Per Week/30 Days Discharge Instructions: Apply moisturizing lotion as directed Topical: Keystone topical Antibiotics (Home Health) 2 x Per Week/30 Days Discharge Instructions: Mix medication according to pharmacy instruction apply to wound bed. Prim Dressing: Hydrofera Blue Classic Foam, 4x4 in (Home Health) 2 x Per Week/30 Days ary Discharge Instructions: Moisten with saline apply over the [facility] medication. Secondary Dressing: Zetuvit Plus 4x8 in [facility]) 2 x Per Week/30 Days Discharge Instructions: Apply over primary dressing as directed. Com pression Wrap: ThreePress (3 layer compression wrap) (Home Health) 2 x Per Week/30 Days Discharge Instructions: Apply  three layer compression as directed. 1. In office sharp debridement 2. Hydrofera Blue and Keystone under 3 layer compression 3. Follow-up in 2 weeks Electronic Signature(s) Signed: 05/21/2022 11:26:20 AM By: [name] Shan DO Entered By: [name] on 05/21/2022 11:25:36 -------------------------------------------------------------------------------- HxROS Details Patient Name: Date of Service: [name], [name] 05/21/2022 10:00 A M Medical Record Number: [id number] Patient Account Number: [id number] Date of Birth/Sex: Treating RN: [birth year] (88 y.o. [name], [name] Primary Care Provider: Marda [name] Other Clinician: Referring Provider: Treating Provider/Extender: [name] Signs in Treatment: 25 Information Obtained From Patient Eyes Medical History: Negative for: Cataracts; Glaucoma; Optic Neuritis Ear/Nose/Mouth/Throat Medical History: Negative for: Chronic sinus problems/congestion; Middle ear problems Hematologic/Lymphatic Medical History: Positive for: Anemia Negative for: Hemophilia; Human Immunodeficiency Virus; Lymphedema; Sickle Cell Disease Respiratory Medical History: Negative for: Aspiration; Asthma; Chronic Obstructive Pulmonary Disease (COPD); Pneumothorax; Sleep Apnea; Tuberculosis Cardiovascular Medical History: Positive for: Congestive Heart Failure; Hypertension; Peripheral Venous Disease Negative for: Angina; Arrhythmia; Coronary Artery Disease; Hypotension; Myocardial Infarction; Peripheral Arterial Disease; Phlebitis; Vasculitis Gastrointestinal Medical History: Negative for: Cirrhosis ; Colitis; Crohns; Hepatitis A; Hepatitis B; Hepatitis C Endocrine Medical History: Negative for: Type I Diabetes; Type II Diabetes Genitourinary Medical History: Negative for: End Stage Renal Disease Immunological Medical History: Negative for: Lupus Erythematosus; Raynauds; Scleroderma Integumentary (Skin) Medical  History: Negative for: History of Burn Musculoskeletal Medical History: Positive for: Osteoarthritis Negative for: Gout; Rheumatoid Arthritis; Osteomyelitis Neurologic Medical History: Negative for: Neuropathy; Quadriplegia; Paraplegia; Seizure Disorder Oncologic Medical History: Positive for: Received Chemotherapy - Breast and Ovarian Cancer Negative for: Received Radiation Immunizations Pneumococcal Vaccine: Received Pneumococcal Vaccination: Yes Received Pneumococcal Vaccination On or After 60th Birthday: Yes Implantable Devices None Hospitalization / Surgery History Type of Hospitalization/Surgery diverticulits Family and Social History Cancer: Yes - Child; Diabetes: No; Heart Disease: No; Hereditary Spherocytosis: No; Hypertension: Yes - Child,Father; Kidney Disease: Yes - Child; Lung Disease: No; Seizures: No; Stroke: Yes - Siblings,Child; Thyroid Problems: Yes - Child; Tuberculosis: No; Never smoker; Marital Status - Widowed; Alcohol Use: Never; Drug Use: No History; Caffeine Use: Never; Financial Concerns: No; Food, Clothing or Shelter Needs: No; Support System Lacking: No; Transportation Concerns: No Electronic Signature(s) Signed: 05/21/2022 11:26:20 AM By: [name] Shan DO Signed: 05/21/2022 11:43:29 AM By: [name] Hammock RN Entered By: [name] on 05/21/2022 11:23:40 -------------------------------------------------------------------------------- SuperBill Details Patient Name: Date of Service: AREEJ, TAYLER [name] 05/21/2022 Medical Record Number: [id number] Patient Account Number: [id number] Date of Birth/Sex: Treating RN: 21-Oct-1925 (88 y.o. [name], [name] Primary Care Provider: Marda [name] Other Clinician: Referring Provider: Treating Provider/Extender: Marjo Bicker Weeks in Treatment: 25 Diagnosis Coding ICD-10 Codes Code Description [phone] Chronic venous hypertension (idiopathic) with ulcer of left lower  extremity L97.822 Non-pressure chronic ulcer of other part of left lower leg with fat layer exposed C50.919 Malignant neoplasm of unspecified site of unspecified female breast I50.42 Chronic combined systolic (congestive) and diastolic (congestive) heart failure  G30.9 Alzheimer's disease, unspecified C54.1 Malignant neoplasm of endometrium Facility Procedures CPT4 Code: [id number] Description: 16579 - DEB SUBQ TISSUE 20 SQ CM/< ICD-10 Diagnosis Description L97.822 Non-pressure chronic ulcer of other part of left lower leg with fat layer expo Modifier: sed Quantity: 1 Physician Procedures : CPT4 Code Description Modifier [id number] 11042 - WC PHYS SUBQ TISS 20 SQ CM ICD-10 Diagnosis Description L97.822 Non-pressure chronic ulcer of other part of left lower leg with fat layer exposed Quantity: 1 Electronic Signature(s) Signed: 05/21/2022 11:26:20 AM By: [name] Shan DO Entered By: [name] on 05/21/2022 11:25:46

## 2022-05-31 ENCOUNTER — Encounter: Payer: Self-pay | Admitting: Cardiology

## 2022-05-31 ENCOUNTER — Ambulatory Visit: Payer: Medicare PPO | Admitting: Cardiology

## 2022-05-31 VITALS — BP 110/61 | HR 60 | Temp 98.0°F | Resp 16 | Ht 66.0 in | Wt 166.6 lb

## 2022-05-31 DIAGNOSIS — I429 Cardiomyopathy, unspecified: Secondary | ICD-10-CM | POA: Diagnosis not present

## 2022-05-31 DIAGNOSIS — I872 Venous insufficiency (chronic) (peripheral): Secondary | ICD-10-CM

## 2022-05-31 DIAGNOSIS — I10 Essential (primary) hypertension: Secondary | ICD-10-CM

## 2022-05-31 DIAGNOSIS — I5032 Chronic diastolic (congestive) heart failure: Secondary | ICD-10-CM

## 2022-05-31 NOTE — Progress Notes (Signed)
[name] Date of Birth: 05-Jan-1925 MRN: [id number] [place], PA-C Former Cardiology Providers: Jeri Lager, APRN, FNP-C Primary Cardiologist: Rex Kras, DO, [facility] (established care May 23, 2020)  Date: 05/31/22 Last Office Visit: 11/30/2021  No chief complaint on file.   HPI  [name] is a 88 y.o.  female whose past medical history and cardiovascular risk factors include: Chronic heart failure with preserved EF, stage B, NYHA class II, history of recovered cardiomyopathy, history of DVT, GERD, history of GI bleed due to diverticulosis, chronic anemia, hypertension, chronic venous stasis, history of uterine cancer status post radiation therapy in 2016, history of breast cancer status postmastectomy and radiation therapy.  Patient presents to the office accompanied by her daughter [name].  Patient is being followed for the management of congestive heart failure.   He presents today for 25-monthfollow-up visit for HFpEF management.  Since last office visit patient denies any hospitalizations or urgent care visits for cardiovascular symptoms.  She has a known history of mildly reduced LVEF of approximately 44% dating back to 2019 and after starting medical therapy her LVEF is recovered.  She is currently on Entresto, spironolactone, and taking Lasix as needed.  She is overall euvolemic on physical examination.  And denies heart failure symptoms or angina pectoris.  Since last office visit she is been following up with wound care and her lower extremity wounds are improving.  FUNCTIONAL STATUS: Walks with cane and walker.    ALLERGIES: No Known Allergies   MEDICATION LIST PRIOR TO VISIT: Current Outpatient Medications on File Prior to Visit  Medication Sig Dispense Refill   acetaminophen (TYLENOL) 325 MG tablet Take 650 mg by mouth every 6 (six) hours as needed.     calcium carbonate (OS-CAL - DOSED IN MG OF ELEMENTAL CALCIUM) 1250  (500 Ca) MG tablet Take 1 tablet by mouth.     Cholecalciferol (VITAMIN D3) 1000 UNITS CAPS Take 1,000 Units by mouth daily.     citalopram (CELEXA) 20 MG tablet Take 20 mg by mouth daily.     ENTRESTO 24-26 MG TAKE 1 TABLET BY MOUTH TWICE DAILY 180 tablet 3   furosemide (LASIX) 20 MG tablet TAKE 1 TABLET BY MOUTH AS NEEDED 90 tablet 1   gentamicin cream (GARAMYCIN) 0.1 % Apply topically.     Multiple Vitamins-Minerals (CENTRUM SILVER PO) Take 1 tablet by mouth daily.     NONFORMULARY OR COMPOUNDED ITEM Antifungal solution: Terbinafine 3%, Fluconazole 2%, Tea Tree Oil 5%, Urea 10%, Ibuprofen 2% in DMSO suspension #[id number] each 3   Omega-3 Fatty Acids (FISH OIL) 1200 MG CPDR 1 capsule     QUEtiapine (SEROQUEL) 25 MG tablet Take 2 tablets (50 mg total) by mouth at bedtime. 180 tablet 3   spironolactone (ALDACTONE) 25 MG tablet TAKE 1 TABLET(25 MG) BY MOUTH DAILY 90 tablet 1   No current facility-administered medications on file prior to visit.    PAST MEDICAL HISTORY: Past Medical History:  Diagnosis Date   Anxiety    [place]ast cancer ([place]   left 1994, right 2007   Diverticulosis    Diverticulosis of colon with hemorrhage 06/19/2014   [place]   right [place] x 2   Endometrial adenocarcinoma ([facility]   biopsy   Family history of malignant neoplasm of gastrointestinal tract    GERD (gastroesophageal reflux disease)    History of blood transfusion    Hx of radiation therapy 02/2006 - 03/2006  right breast   Hx of radiation therapy 5/12, 5/19, 5/29, 6/2, 04/30/2013   proximal vagina, 30 Gy in 5 sessions, HDR   Hypertension    Iron deficiency    Memory loss    Peripheral vascular disease ([place])    leg wounds   Personal history of radiation therapy    Ulcers of both lower extremities ([place])    chronic   Venous stasis    bilateral    PAST SURGICAL HISTORY: Past Surgical History:  Procedure Laterality Date   ABDOMINAL HYSTERECTOMY  01/18/13   robotic,  bso   BREAST LUMPECTOMY Right    COLONOSCOPY WITH PROPOFOL N/A 09/16/2017   Procedure: COLONOSCOPY WITH PROPOFOL;  Surgeon: Wilford Corner, MD;  Location[facility] ENDOSCOPY;  Service: Endoscopy;  Laterality: N/A;  Needs STAT CBC drawn before procedure   MASTECTOMY Left 1994   left , Tamoxifen x 17 yrs   partial mastectomy Right 2007   radiation    FAMILY HISTORY: The patient's family history includes Anuerysm in her sister; Breast cancer in her daughter and sister; Cancer in her brother and father; Colon cancer in her sister; Heart disease in her sister; Other in her mother; Prostate cancer in her brother.   SOCIAL HISTORY:  The patient  reports that she has never smoked. She has never used smokeless tobacco. She reports that she does not drink alcohol and does not use drugs.  Review of Systems  Cardiovascular:  Positive for leg swelling (improving.). Negative for chest pain, dyspnea on exertion, orthopnea, palpitations, paroxysmal nocturnal dyspnea and syncope.  Respiratory:  Negative for shortness of breath.     PHYSICAL EXAM:    05/31/2022    9:58 AM 11/30/2021   10:13 AM 07/08/2021   10:14 AM  Vitals with BMI  Height '5\' 6"'  '5\' 6"'  '5\' 6"'   Weight 166 lbs 10 oz 166 lbs 171 lbs  BMI 26.9 48.25 00.37  Systolic 048 889 169  Diastolic 61 63 65  Pulse 60 64 59    CONSTITUTIONAL: Age-appropriate female, hemodynamically stable, no acute distress, present in wheelchair. SKIN: Skin is warm and dry. No rash noted. No cyanosis. No pallor. No jaundice HEAD: Normocephalic and atraumatic.  EYES: No scleral icterus MOUTH/THROAT: Moist oral membranes.  NECK: No JVD present. No thyromegaly noted. No carotid bruits  CHEST Normal respiratory effort. No intercostal retractions  LUNGS: Clear to auscultation bilaterally.  No stridor. No wheezes. No rales.  CARDIOVASCULAR: Regular, positive I5-W3, soft holosystolic murmur heard at the apex, no gallops or rubs. ABDOMINAL: No apparent ascites.   EXTREMITIES: +trace bilateral peripheral edema, wearing bilateral compression stockings up to the knee.  HEMATOLOGIC: No significant bruising NEUROLOGIC: Oriented to person, place, and time. Nonfocal. Normal muscle tone.  PSYCHIATRIC: Normal mood and affect. Normal behavior. Cooperative  CARDIAC DATABASE: EKG: 05/31/2022: NSR, 62 bpm, first-degree AV block, without underlying ischemia injury pattern.  Echocardiogram: 08/22/2018: LVEF 44%, moderate LVH, mildly decreased global wall motion, grade 1 diastolic impairment.  Trace MR, moderate TR, RVSP 40 mmHg.  07/02/2019: LVEF 88%, grade 1 diastolic impairment, mild biatrial dilatation, trace AR, mild MR, moderate TR, PASP 44 mmHg.  Stress Testing:  None  Heart Catheterization: None   LABORATORY DATA:    Latest Ref Rng & Units 12/20/2018   11:37 AM 08/08/2018    5:13 PM 06/19/2018   12:52 PM  CBC  WBC 4.0 - 10.5 K/uL 5.2  5.1  6.3   Hemoglobin 12.0 - 15.0 g/dL 10.5  10.2  10.8  Hematocrit 36.0 - 46.0 % 32.0  30.8  32.4   Platelets 150 - 400 K/uL 191  218  216        Latest Ref Rng & Units 05/28/2021    2:28 PM 01/15/2019   11:52 AM 12/20/2018   11:37 AM  CMP  Glucose 65 - 99 mg/dL 102  84  89   BUN 10 - 36 mg/dL '29  19  23   ' Creatinine 0.57 - 1.00 mg/dL 1.34  1.06  1.08   Sodium 134 - 144 mmol/L 140  143  141   Potassium 3.5 - 5.2 mmol/L 4.1  5.0  4.3   Chloride 96 - 106 mmol/L 102  103  103   CO2 20 - 29 mmol/L '22  19  31   ' Calcium 8.7 - 10.3 mg/dL 9.7  9.8  9.7   Total Protein 6.5 - 8.1 g/dL   6.8   Total Bilirubin 0.3 - 1.2 mg/dL   0.4   Alkaline Phos 38 - 126 U/L   62   AST 15 - 41 U/L   12   ALT 0 - 44 U/L   7     Lipid Panel  No results found for: "CHOL", "TRIG", "HDL", "CHOLHDL", "VLDL", "LDLCALC", "LDLDIRECT", "LABVLDL"  No results found for: "HGBA1C" No components found for: "NTPROBNP" No results found for: "TSH"  Cardiac Panel (last 3 results) No results for input(s): "CKTOTAL", "CKMB", "TROPONINIHS",  "RELINDX" in the last 72 hours.   External Labs: Collected: 09/24/2020 from care everywhere Creatinine 1.26 mg/dL. eGFR: 48 mL/min per 1.73 m Potassium 4.8 TSH 2.31 Magnesium 2.3  Collected: 08/20/2020 Serum creatinine 1.26 mg/dL. GFR 48 Lipid profile: Total cholesterol 161, triglycerides 54, HDL 56, LDL 94  IMPRESSION:    ICD-10-CM   1. Chronic diastolic heart failure (HCC)  I50.32 EKG 12-Lead    PCV ECHOCARDIOGRAM COMPLETE    Pro b natriuretic peptide (BNP)    Basic metabolic panel    Magnesium    2. Recovered cardiomyopathy  I42.9 PCV ECHOCARDIOGRAM COMPLETE    Pro b natriuretic peptide (BNP)    Basic metabolic panel    Magnesium    3. Chronic venous stasis dermatitis of both lower extremities  I87.2     4. Essential hypertension  I10        RECOMMENDATIONS: Everley Evora is a 88 y.o. female whose past medical history and cardiovascular risk factors include: Chronic heart failure with preserved EF, stage B, NYHA class II, history of recovered cardiomyopathy, history of DVT, GERD, history of GI bleed due to diverticulosis, chronic anemia, hypertension, chronic venous stasis, history of uterine cancer status post radiation therapy in 2016, history of breast cancer status postmastectomy and radiation therapy.  Chronic diastolic heart failure (HCC) / Recovered cardiomyopathy Stage B, NYHA class II Compensated. Weight remains relatively stable since last office visit. Medications reconciled. We will check BMP, NT proBNP, and magnesium as she has not had any labs done in the recent past.   Care Everywhere reviewed. Plan echocardiogram prior to the next office visit to reevaluate LVEF as its been 3 years since last echo. Patient has good family support.  Chronic venous stasis dermatitis of both lower extremities Currently follows with wound care. Improving.  Essential hypertension Well controlled. Medications reconciled. Reemphasized importance of low-salt  diet.   No changes warranted at this time  FINAL MEDICATION LIST END OF ENCOUNTER: No orders of the defined types were placed in this encounter.  Current Outpatient Medications:    acetaminophen (TYLENOL) 325 MG tablet, Take 650 mg by mouth every 6 (six) hours as needed., Disp: , Rfl:    calcium carbonate (OS-CAL - DOSED IN MG OF ELEMENTAL CALCIUM) 1250 (500 Ca) MG tablet, Take 1 tablet by mouth., Disp: , Rfl:    Cholecalciferol (VITAMIN D3) 1000 UNITS CAPS, Take 1,000 Units by mouth daily., Disp: , Rfl:    citalopram (CELEXA) 20 MG tablet, Take 20 mg by mouth daily., Disp: , Rfl:    ENTRESTO 24-26 MG, TAKE 1 TABLET BY MOUTH TWICE DAILY, Disp: 180 tablet, Rfl: 3   furosemide (LASIX) 20 MG tablet, TAKE 1 TABLET BY MOUTH AS NEEDED, Disp: 90 tablet, Rfl: 1   gentamicin cream (GARAMYCIN) 0.1 %, Apply topically., Disp: , Rfl:    Multiple Vitamins-Minerals (CENTRUM SILVER PO), Take 1 tablet by mouth daily., Disp: , Rfl:    NONFORMULARY OR COMPOUNDED ITEM, Antifungal solution: Terbinafine 3%, Fluconazole 2%, Tea Tree Oil 5%, Urea 10%, Ibuprofen 2% in DMSO suspension #28m, Disp: 1 each, Rfl: 3   Omega-3 Fatty Acids (FISH OIL) 1200 MG CPDR, 1 capsule, Disp: , Rfl:    QUEtiapine (SEROQUEL) 25 MG tablet, Take 2 tablets (50 mg total) by mouth at bedtime., Disp: 180 tablet, Rfl: 3   spironolactone (ALDACTONE) 25 MG tablet, TAKE 1 TABLET(25 MG) BY MOUTH DAILY, Disp: 90 tablet, Rfl: 1  Orders Placed This Encounter  Procedures   Pro b natriuretic peptide (BNP)   Basic metabolic panel   Magnesium   EKG 12-Lead   PCV ECHOCARDIOGRAM COMPLETE   --Continue cardiac medications as reconciled in final medication list. --Return in about 6 months (around 12/01/2022) for Follow up HFpEF.. Or sooner if needed. --Continue follow-up with your primary care physician regarding the management of your other chronic comorbid conditions.  Patient's questions and concerns were addressed to her satisfaction. She  voices understanding of the instructions provided during this encounter.   This note was created using a voice recognition software as a result there may be grammatical errors inadvertently enclosed that do not reflect the nature of this encounter. Every attempt is made to correct such errors.  SRex Kras DN[facility] Pager: [phone]: [phone]

## 2022-06-01 DIAGNOSIS — I5032 Chronic diastolic (congestive) heart failure: Secondary | ICD-10-CM | POA: Diagnosis not present

## 2022-06-01 DIAGNOSIS — I429 Cardiomyopathy, unspecified: Secondary | ICD-10-CM | POA: Diagnosis not present

## 2022-06-02 LAB — BASIC METABOLIC PANEL
BUN/Creatinine Ratio: 20 (ref 12–28)
BUN: 33 mg/dL (ref 10–36)
CO2: 25 mmol/L (ref 20–29)
Calcium: 9.5 mg/dL (ref 8.7–10.3)
Chloride: 106 mmol/L (ref 96–106)
Creatinine, Ser: 1.61 mg/dL — ABNORMAL HIGH (ref 0.57–1.00)
Glucose: 96 mg/dL (ref 70–99)
Potassium: 4.6 mmol/L (ref 3.5–5.2)
Sodium: 144 mmol/L (ref 134–144)
eGFR: 29 mL/min/{1.73_m2} — ABNORMAL LOW (ref >59)

## 2022-06-02 LAB — MAGNESIUM: Magnesium: 1.9 mg/dL (ref 1.6–2.3)

## 2022-06-02 LAB — PRO B NATRIURETIC PEPTIDE: NT-Pro BNP: 419 pg/mL (ref 0–738)

## 2022-06-04 ENCOUNTER — Ambulatory Visit (HOSPITAL_BASED_OUTPATIENT_CLINIC_OR_DEPARTMENT_OTHER): Payer: Medicare PPO | Admitting: Internal Medicine

## 2022-06-04 ENCOUNTER — Other Ambulatory Visit: Payer: Self-pay | Admitting: Cardiology

## 2022-06-04 DIAGNOSIS — I129 Hypertensive chronic kidney disease with stage 1 through stage 4 chronic kidney disease, or unspecified chronic kidney disease: Secondary | ICD-10-CM

## 2022-06-04 DIAGNOSIS — I5032 Chronic diastolic (congestive) heart failure: Secondary | ICD-10-CM

## 2022-06-04 NOTE — Progress Notes (Signed)
Telephone call: Spoke to the patient's daughter [name] over the phone.  Recent lab notes serum creatinine 1.6 mg/dL a year ago it was 1.3 mg/dL likely progression of CKD.  However, she is on medications that may affect her renal function as well.  Currently takes Entresto 24/26 mg p.o. twice daily, Lasix on as needed basis, spironolactone 25 mg p.o. daily.  I have asked her to stop spironolactone for now.  Get labs in 1 week to reevaluate renal function.   And use Lasix on as needed basis if she notices the patient's weight to increase by more than 1 pound over 24 hours or 3 pounds over the course of the week.    ICD-10-CM   1. Chronic diastolic heart failure (HCC)  Z83.46 Basic metabolic panel    2. Benign hypertensive CKD, stage 1-4 or unspecified chronic kidney disease  I19.4 Basic metabolic panel     Orders Placed This Encounter  Procedures   Basic metabolic panel    Standing Status:   Future    Standing Expiration Date:   06/05/2023     Rex Kras, [facility]  Pager: [phone] Office: [phone]

## 2022-06-08 ENCOUNTER — Encounter (HOSPITAL_BASED_OUTPATIENT_CLINIC_OR_DEPARTMENT_OTHER): Payer: Medicare PPO | Attending: Internal Medicine | Admitting: Internal Medicine

## 2022-06-08 DIAGNOSIS — I5042 Chronic combined systolic (congestive) and diastolic (congestive) heart failure: Secondary | ICD-10-CM | POA: Insufficient documentation

## 2022-06-08 DIAGNOSIS — Z8542 Personal history of malignant neoplasm of other parts of uterus: Secondary | ICD-10-CM | POA: Insufficient documentation

## 2022-06-08 DIAGNOSIS — I129 Hypertensive chronic kidney disease with stage 1 through stage 4 chronic kidney disease, or unspecified chronic kidney disease: Secondary | ICD-10-CM | POA: Diagnosis not present

## 2022-06-08 DIAGNOSIS — I87312 Chronic venous hypertension (idiopathic) with ulcer of left lower extremity: Secondary | ICD-10-CM | POA: Insufficient documentation

## 2022-06-08 DIAGNOSIS — L97822 Non-pressure chronic ulcer of other part of left lower leg with fat layer exposed: Secondary | ICD-10-CM | POA: Diagnosis not present

## 2022-06-08 DIAGNOSIS — I5032 Chronic diastolic (congestive) heart failure: Secondary | ICD-10-CM | POA: Diagnosis not present

## 2022-06-08 DIAGNOSIS — G309 Alzheimer's disease, unspecified: Secondary | ICD-10-CM | POA: Diagnosis not present

## 2022-06-08 DIAGNOSIS — Z853 Personal history of malignant neoplasm of breast: Secondary | ICD-10-CM | POA: Insufficient documentation

## 2022-06-08 DIAGNOSIS — F0280 Dementia in other diseases classified elsewhere without behavioral disturbance: Secondary | ICD-10-CM | POA: Diagnosis not present

## 2022-06-08 NOTE — Progress Notes (Signed)
[name], [name] ([id number]) Visit Report for 06/08/2022 Arrival Information Details Patient Name: Date of Service: [name], [name] 06/08/2022 11:30 A M Medical Record Number: [id number] Patient Account Number: [id number] Date of Birth/Sex: Treating RN: 09-01-1925 (88 y.o. F) Primary Care Janaiah Vetrano: Marda Stalker Other Clinician: Referring Drayven Marchena: Treating Stony Stegmann/Extender: Aviva Signs in Treatment: 61 Visit Information History Since Last Visit Added or deleted any medications: No Patient Arrived: Wheel Chair Any new allergies or adverse reactions: No Arrival Time: 10:41 Had a fall or experienced change in No Accompanied By: Daughter activities of daily living that may affect Transfer Assistance: None risk of falls: Patient Identification Verified: Yes Signs or symptoms of abuse/neglect since last visito No Secondary Verification Process Completed: Yes Hospitalized since last visit: No Patient Requires Transmission-Based Precautions: No Implantable device outside of the clinic excluding No Patient Has Alerts: No cellular tissue based products placed in the center since last visit: Has Dressing in Place as Prescribed: Yes Has Compression in Place as Prescribed: Yes Pain Present Now: No Electronic Signature(s) Signed: 06/08/2022 4:17:28 PM By: Erenest Blank Entered By: Erenest Blank on 06/08/2022 10:42:33 -------------------------------------------------------------------------------- Compression Therapy Details Patient Name: Date of Service: [name], CONWAY [name] 06/08/2022 11:30 A M Medical Record Number: [id number] Patient Account Number: [id number] Date of Birth/Sex: Treating RN: [birth year] (88 y.o. [name], [name] Primary Care Oaklan Persons: Marda Stalker Other Clinician: Referring Jaquavis Felmlee: Treating Eugen Jeansonne/Extender: Aviva Signs in Treatment: 27 Compression Therapy Performed for Wound Assessment:  Wound #26 Left,Lateral Ankle Performed By: Clinician Rhae Hammock, RN Compression Type: Three Layer Post Procedure Diagnosis Same as Pre-procedure Electronic Signature(s) Signed: 06/08/2022 4:03:03 PM By: [name] Entered By: Rhae Hammock on 06/08/2022 11:26:54 -------------------------------------------------------------------------------- Encounter Discharge Information Details Patient Name: Date of Service: [name], GONSALVES [name] 06/08/2022 11:30 A M Medical Record Number: [id number] Patient Account Number: [id number] Date of Birth/Sex: Treating RN: 03/16/1925 (88 y.o. [name], [name] Primary Care Radford Pease: Marda Stalker Other Clinician: Referring Valree Feild: Treating Rajendra Spiller/Extender: Aviva Signs in Treatment: 27 Encounter Discharge Information Items Post Procedure Vitals Discharge Condition: Stable Temperature (F): 98.7 Ambulatory Status: Wheelchair Pulse (bpm): 74 Discharge Destination: Home Respiratory Rate (breaths/min): 17 Transportation: Private Auto Blood Pressure (mmHg): 134/74 Accompanied By: daughter Schedule Follow-up Appointment: Yes Clinical Summary of Care: Patient Declined Electronic Signature(s) Signed: 06/08/2022 4:03:03 PM By: [name] Entered By: Rhae Hammock on 06/08/2022 12:00:09 -------------------------------------------------------------------------------- Lower Extremity Assessment Details Patient Name: Date of Service: [name], WALDER [name] 06/08/2022 11:30 A M Medical Record Number: [id number] Patient Account Number: [id number] Date of Birth/Sex: Treating RN: 1925/01/24 (88 y.o. F) Primary Care Darren Caldron: Marda Stalker Other Clinician: Referring Salote Weidmann: Treating Dominik Lauricella/Extender: Marjo Bicker Weeks in Treatment: 27 Edema Assessment Assessed: [Left: No] [Right: No] Edema: [Left: N] [Right: o] Calf Left: Right: Point of Measurement: 30 cm From  Medial Instep 32.7 cm Ankle Left: Right: Point of Measurement: 11 cm From Medial Instep 21 cm Electronic Signature(s) Signed: 06/08/2022 4:17:28 PM By: Erenest Blank Entered By: Erenest Blank on 06/08/2022 10:58:18 -------------------------------------------------------------------------------- Multi Wound Chart Details Patient Name: Date of Service: [name], TATUM [name] 06/08/2022 11:30 A M Medical Record Number: [id number] Patient Account Number: [id number] Date of Birth/Sex: Treating RN: November 08, 1925 (88 y.o. F) Primary Care Jazzmon Prindle: Marda Stalker Other Clinician: Referring Oreoluwa Aigner: Treating Sunset Joshi/Extender: Aviva Signs in Treatment: 27 Vital Signs Height(in): 66 Pulse(bpm): 65 Weight(lbs): 176 Blood Pressure(mmHg): 143/68 Body Mass Index(BMI): 28.4 Temperature(F): 97.7 Respiratory Rate(breaths/min): 18 Photos: [N/A:N/A] Left, Lateral Ankle N/A N/A Wound Location: Gradually  Appeared N/A N/A Wounding Event: Venous Leg Ulcer N/A N/A Primary Etiology: Anemia, Congestive Heart Failure, N/A N/A Comorbid History: Hypertension, Peripheral Venous Disease, Osteoarthritis, Received Chemotherapy 11/20/2021 N/A N/A Date Acquired: 61 N/A N/A Weeks of Treatment: Open N/A N/A Wound Status: No N/A N/A Wound Recurrence: 1.8x1.4x0.1 N/A N/A Measurements L x W x D (cm) 1.979 N/A N/A A (cm) : rea 0.198 N/A N/A Volume (cm) : 79.00% N/A N/A % Reduction in A rea: 79.00% N/A N/A % Reduction in Volume: Full Thickness Without Exposed N/A N/A Classification: Support Structures Medium N/A N/A Exudate A mount: Serosanguineous N/A N/A Exudate Type: red, brown N/A N/A Exudate Color: Distinct, outline attached N/A N/A Wound Margin: Large (67-100%) N/A N/A Granulation A mount: Red N/A N/A Granulation Quality: Small (1-33%) N/A N/A Necrotic A mount: Fat Layer (Subcutaneous Tissue): Yes N/A N/A Exposed Structures: Fascia: No Tendon:  No Muscle: No Joint: No Bone: No Small (1-33%) N/A N/A Epithelialization: Debridement - Excisional N/A N/A Debridement: Pre-procedure Verification/Time Out 11:26 N/A N/A Taken: Lidocaine N/A N/A Pain Control: Subcutaneous, Slough N/A N/A Tissue Debrided: Skin/Subcutaneous Tissue N/A N/A Level: 2.52 N/A N/A Debridement A (sq cm): rea Curette N/A N/A Instrument: Minimum N/A N/A Bleeding: Pressure N/A N/A Hemostasis A chieved: 0 N/A N/A Procedural Pain: 0 N/A N/A Post Procedural Pain: Procedure was tolerated well N/A N/A Debridement Treatment Response: 1.8x1.4x0.1 N/A N/A Post Debridement Measurements L x W x D (cm) 0.198 N/A N/A Post Debridement Volume: (cm) Cellular or Tissue Based Product N/A N/A Procedures Performed: Compression Therapy Debridement Treatment Notes Electronic Signature(s) Signed: 06/08/2022 12:02:15 PM By: Kalman Shan DO Entered By: Kalman Shan on 06/08/2022 11:36:08 -------------------------------------------------------------------------------- Multi-Disciplinary Care Plan Details Patient Name: Date of Service: CHANDRA, ASHER [name] 06/08/2022 11:30 A M Medical Record Number: [id number] Patient Account Number: [id number] Date of Birth/Sex: Treating RN: 1925/11/01 (88 y.o. [name], [name] Primary Care Wisdom Seybold: Marda Stalker Other Clinician: Referring Charron Coultas: Treating Lynell Kussman/Extender: Aviva Signs in Treatment: 27 Active Inactive Nutrition Nursing Diagnoses: Imbalanced nutrition Goals: Patient/caregiver agrees to and verbalizes understanding of need to use nutritional supplements and/or vitamins as prescribed Date Initiated: 11/27/2021 Target Resolution Date: 06/26/2022 Goal Status: Active Interventions: Assess patient nutrition upon admission and as needed per policy Provide education on nutrition Treatment Activities: Education provided on Nutrition : 04/22/2022 Notes: Wound/Skin  Impairment Nursing Diagnoses: Impaired tissue integrity Goals: Patient/caregiver will verbalize understanding of skin care regimen Date Initiated: 11/27/2021 Target Resolution Date: 06/26/2022 Goal Status: Active Ulcer/skin breakdown will have a volume reduction of 30% by week 4 Date Initiated: 11/27/2021 Date Inactivated: 12/31/2021 Target Resolution Date: 12/25/2021 Unmet Reason: according to Goal Status: Unmet measurements has not reduced. Interventions: Assess patient/caregiver ability to obtain necessary supplies Assess patient/caregiver ability to perform ulcer/skin care regimen upon admission and as needed Assess ulceration(s) every visit Provide education on ulcer and skin care Treatment Activities: Topical wound management initiated : 11/27/2021 Notes: Electronic Signature(s) Signed: 06/08/2022 4:03:03 PM By: [name] Entered By: Rhae Hammock on 06/08/2022 11:05:42 -------------------------------------------------------------------------------- Pain Assessment Details Patient Name: Date of Service: MERCED, BROUGHAM [name] 06/08/2022 11:30 A M Medical Record Number: [id number] Patient Account Number: [id number] Date of Birth/Sex: Treating RN: 08-Mar-1925 (88 y.o. F) Primary Care Daisee Centner: Marda Stalker Other Clinician: Referring Jemery Stacey: Treating Neita Landrigan/Extender: Aviva Signs in Treatment: 27 Active Problems Location of Pain Severity and Description of Pain Patient Has Paino No Site Locations Pain Management and Medication Current Pain Management: Electronic Signature(s) Signed: 06/08/2022 4:17:28 PM By: Erenest Blank Entered  By: Erenest Blank on 06/08/2022 10:42:47 -------------------------------------------------------------------------------- Patient/Caregiver Education Details Patient Name: Date of Service: SORAH, FALKENSTEIN [name] 7/18/2023andnbsp11:30 A M Medical Record Number: [id number] Patient Account Number:  [id number] Date of Birth/Gender: Treating RN: 05-03-1925 (88 y.o. [name], [name] Primary Care Physician: Marda Stalker Other Clinician: Referring Physician: Treating Physician/Extender: Aviva Signs in Treatment: 56 Education Assessment Education Provided To: Patient Education Topics Provided Nutrition: Methods: Explain/Verbal Responses: State content correctly Electronic Signature(s) Signed: 06/08/2022 4:03:03 PM By: [name] Entered By: Rhae Hammock on 06/08/2022 11:05:53 -------------------------------------------------------------------------------- Wound Assessment Details Patient Name: Date of Service: CHARICE, ZUNO [name] 06/08/2022 11:30 A M Medical Record Number: [id number] Patient Account Number: [id number] Date of Birth/Sex: Treating RN: 1925-06-21 (88 y.o. F) Primary Care Yesennia Hirota: Marda Stalker Other Clinician: Referring Aerin Delany: Treating Bates Collington/Extender: Marjo Bicker Weeks in Treatment: 27 Wound Status Wound Number: 26 Primary Venous Leg Ulcer Etiology: Wound Location: Left, Lateral Ankle Wound Open Wounding Event: Gradually Appeared Status: Date Acquired: 11/20/2021 Comorbid Anemia, Congestive Heart Failure, Hypertension, Peripheral Weeks Of Treatment: 27 History: Venous Disease, Osteoarthritis, Received Chemotherapy Clustered Wound: No Photos Wound Measurements Length: (cm) 1.8 Width: (cm) 1.4 Depth: (cm) 0.1 Area: (cm) 1.979 Volume: (cm) 0.198 % Reduction in Area: 79% % Reduction in Volume: 79% Epithelialization: Small (1-33%) Tunneling: No Undermining: No Wound Description Classification: Full Thickness Without Exposed Support Structures Wound Margin: Distinct, outline attached Exudate Amount: Medium Exudate Type: Serosanguineous Exudate Color: red, brown Foul Odor After Cleansing: No Slough/Fibrino Yes Wound Bed Granulation Amount: Large (67-100%) Exposed  Structure Granulation Quality: Red Fascia Exposed: No Necrotic Amount: Small (1-33%) Fat Layer (Subcutaneous Tissue) Exposed: Yes Necrotic Quality: Adherent Slough Tendon Exposed: No Muscle Exposed: No Joint Exposed: No Bone Exposed: No Treatment Notes Wound #26 (Ankle) Wound Laterality: Left, Lateral Cleanser Soap and Water Discharge Instruction: May shower and wash wound with dial antibacterial soap and water prior to dressing change. Peri-Wound Care Sween Lotion (Moisturizing lotion) Discharge Instruction: Apply moisturizing lotion as directed Topical Primary Dressing VENDAJE SKIN SUB, ADAPTIC, STERI STRIPS Secondary Dressing Zetuvit Plus 4x8 in Discharge Instruction: Apply over primary dressing as directed. Secured With Compression Wrap ThreePress (3 layer compression wrap) Discharge Instruction: Apply three layer compression as directed. Compression Stockings Add-Ons Electronic Signature(s) Signed: 06/08/2022 4:17:28 PM By: Erenest Blank Entered By: Erenest Blank on 06/08/2022 11:00:25 -------------------------------------------------------------------------------- Vitals Details Patient Name: Date of Service: TOMESHIA, PIZZI [name] 06/08/2022 11:30 A M Medical Record Number: [id number] Patient Account Number: [id number] Date of Birth/Sex: Treating RN: 1924/12/23 (88 y.o. F) Primary Care Jericha Bryden: Marda Stalker Other Clinician: Referring Cougar Imel: Treating Lanayah Gartley/Extender: Aviva Signs in Treatment: 27 Vital Signs Time Taken: 10:45 Temperature (F): 97.7 Height (in): 66 Pulse (bpm): 65 Weight (lbs): 176 Respiratory Rate (breaths/min): 18 Body Mass Index (BMI): 28.4 Blood Pressure (mmHg): 143/68 Reference Range: 80 - 120 mg / dl Electronic Signature(s) Signed: 06/08/2022 4:17:28 PM By: Erenest Blank Entered By: Erenest Blank on 06/08/2022 10:45:34

## 2022-06-08 NOTE — Progress Notes (Signed)
[name] ([id number]) Visit Report for 06/08/2022 Chief Complaint Document Details Patient Name: Date of Service: [name], [name] 06/08/2022 11:30 A M Medical Record Number: [id number] Patient Account Number: [id number] Date of Birth/Sex: Treating RN: 1925-06-16 (88 y.o. F) Primary Care Provider: Marda Stalker Other Clinician: Referring Provider: Treating Provider/Extender: Aviva Signs in Treatment: 27 Information Obtained from: Patient Chief Complaint Left lower extremity wound Electronic Signature(s) Signed: 06/08/2022 12:02:15 PM By: Kalman Shan DO Entered By: Kalman Shan on 06/08/2022 11:36:19 -------------------------------------------------------------------------------- Cellular or Tissue Based Product Details Patient Name: Date of Service: [name], [name] 06/08/2022 11:30 A M Medical Record Number: [id number] Patient Account Number: [id number] Date of Birth/Sex: Treating RN: 1925/02/01 (88 y.o. [name], [name]: Marda Stalker Other Clinician: Referring Provider: Treating Provider/Extender: Aviva Signs in Treatment: 27 Cellular or Tissue Based Product Type Wound #26 Left,Lateral Ankle Applied to: Performed By: Physician Kalman Shan, DO Cellular or Tissue Based Product Type: Other Level of Consciousness (Pre-procedure): Awake and Alert Pre-procedure Verification/Time Out Yes - 11:33 Taken: Location: trunk / arms / legs Wound Size (sq cm): 2.52 Product Size (sq cm): 8 Waste Size (sq cm): 0 Amount of Product Applied (sq cm): 8 Instrument Used: Forceps Lot #: [id number] Expiration Date: 01/26/2025 Fenestrated: No Reconstituted: Yes Solution Type: normal Saline Solution Amount: 73m Lot #: [id number] Expiration Date: 08/22/2022 Secured: Yes Secured With: Steri-Strips Dressing Applied: Yes Primary Dressing: Adaptic Procedural Pain: 0 Post  Procedural Pain: 0 Response to Treatment: Procedure was tolerated well Level of Consciousness (Post- Awake and Alert procedure): Post Procedure Diagnosis Same as Pre-procedure Electronic Signature(s) Signed: 06/08/2022 12:02:15 PM By: HKalman ShanDO Signed: 06/08/2022 4:03:03 PM By: BRhae HammockRN Entered By: BRhae Hammockon 06/08/2022 11:35:30 -------------------------------------------------------------------------------- Debridement Details Patient Name: Date of Service: [name], [name] 06/08/2022 11:30 A M Medical Record Number: [id number] Account Number: [id number] of Birth/Sex: Treating RN: 9Jul 05, 1926(88y.o. FTonita Phoenix [name]: WMarda StalkerOther Clinician: Referring Provider: Treating Provider/Extender: HAviva Signsin Treatment: 27 Debridement Performed for Assessment: Wound #26 Left,Lateral Ankle Performed By: Physician HKalman Shan DO Debridement Type: Debridement Severity of Tissue Pre Debridement: Fat layer exposed Level of Consciousness (Pre-procedure): Awake and Alert Pre-procedure Verification/Time Out Yes - 11:26 Taken: Start Time: 11:26 Pain Control: Lidocaine T Area Debrided (L x W): otal 1.8 (cm) x 1.4 (cm) = 2.52 (cm) Tissue and other material debrided: Viable, Non-Viable, Slough, Subcutaneous, Slough Level: Skin/Subcutaneous Tissue Debridement Description: Excisional Instrument: Curette Bleeding: Minimum Hemostasis Achieved: Pressure End Time: 11:26 Procedural Pain: 0 Post Procedural Pain: 0 Response to Treatment: Procedure was tolerated well Level of Consciousness (Post- Awake and Alert procedure): Post Debridement Measurements of Total Wound Length: (cm) 1.8 Width: (cm) 1.4 Depth: (cm) 0.1 Volume: (cm) 0.198 Character of Wound/Ulcer Post Debridement: Improved Severity of Tissue Post Debridement: Fat layer exposed Post Procedure Diagnosis Same as  Pre-procedure Electronic Signature(s) Signed: 06/08/2022 12:02:15 PM By: HKalman ShanDO Signed: 06/08/2022 4:03:03 PM By: BRhae HammockRN Entered By: BRhae Hammockon 06/08/2022 11:26:41 -------------------------------------------------------------------------------- HPI Details Patient Name: Date of Service: [name], [name] 06/08/2022 11:30 A M Medical Record Number: [id number] Account Number: [id number] of Birth/Sex: Treating RN: 928-Mar-1926(88y.o. F) Primary Care Provider: WMarda StalkerOther Clinician: Referring Provider: Treating Provider/Extender: HAviva Signsin Treatment: 259History of Present Illness HPI Description: this is a patient we know from several prior wounds on her bilateral lower extremities. She  has venous stasis physiology, inflammation and hypertension. She is been fully evaluated for venous ablation and was found not to be a candidate. She does not have significant arterial disease. 02/14/15 the wound itself on her lateral left leg did not look too bad however there was surrounding maceration which was concerning 02/27/15 the wound itself is small with surrounding circumferential epithelialization there is no surrounding maceration. 03/06/15 only a very small area remains. I think this is mostly epithelialized however I would be uncomfortable not dressing this this week 03/13/15 the area is epithelialized. There was a thick surface on this. I took a #15 blade then lightly disrupted it but there does not appear to be any open area I did not see anything but appropriate epithelium Readmission: 08/16/18 on evaluation today patient presents for initial evaluation and our clinic released readmission although she has not been seen since April 2016. Nonetheless she is having issues with the ulcers currently on the bilateral lateral malleolus or locations as well as the left lateral lower extremity. These have been  present for roughly 3 weeks since the patient actually developed significant bilateral lower extremity edema which patient's daughter states was roughly 3 times the size of her legs currently. Subsequently she ended up in the emerging department due to congestive heart failure. They were able to get the swelling under control and fortunately she seems to be doing much better at this point in time. She was placed in an [employer] which she sat on for week part evaluation today as well. Fortunately the wound areas actually seem to be doing fairly well there is some macerated skin surrounding there are the areas/surface of the wound which are gonna require some debridement at this point. No fevers, chills, nausea, or vomiting noted at this time. Patient has a history of hypertension, mild peripheral vascular disease, chronic venous stasis, and other than the ulcer seems to be doing fairly well today. No fevers, chills, nausea, or vomiting noted at this time. 08/23/18 on evaluation today patient actually appears to be doing great in regard to her bilateral lower should be ulcers. She's shown signs of improvement even just with one week with the [employer]. In general I'm actually very pleased with how things appear at this point. 09/06/18 on evaluation today patient actually appears to be doing much better her left lower extremity is completely healed. Go to the right lower extremity lateral malleolus ulcer this still seems to be showing signs of being open. There does not appear to be evidence of infection which is good news. This did require some sharp debridement however to remove some of the necrotic tissue/eschar on the surface of the wound Readmission 11/27/2021 Ms. Yassmin Binegar is a 88 year old female with a past medical history of endometrial and breast cancer, chronic venous insufficiency, dementia and essential hypertension that presents to the clinic with a 1 month history of  nonhealing ulcer to the left lower extremity. She states that the ulcer opened 1 month ago and then healed but has reopened again in the past week. She is currently keeping the area covered. She has a history of wounds to her lower extremities. She has compression stockings that she uses daily. She currently denies signs of infection. 1/13; patient presents for follow-up. She followed up for nurse visit for wrap change. She reports more odor. She denies pain. 1/20; patient presents for follow-up. She has been using gentamicin ointment with calcium alginate to the wound bed. She reports improvement  in odor and drainage. She currently denies systemic signs of infection. 1/27; patient presents for follow-up. She has been using gentamicin ointment with calcium alginate. She received her Keystone antibiotic 2 days ago. She brought this in. Home health has also been established. They will come out for the first time next week. She currently denies signs of infection. She has no issues or complaints today. 2/2; patient presents for follow-up. She has been using Keystone antibiotics with [facility] with no issues. She denies signs of infection. She has no issues or complaints today. 2/9; patient presents for follow-up. She has been using Keystone antibiotics with [facility] with no issues. She has tolerated the compression wrap well. She denies signs of infection. 2/16; patient presents for follow-up. She has been using Keystone antibiotics with Hydrofera Blue under compression wrap with no issues. She currently denies signs of infection. 2/23; patient presents for follow-up. She continues to use Keystone antibiotics with Hydrofera Blue under the compression wrap. She has no issues or complaints today. She denies signs of infection. Home health continues to come out and change the wrap. 3/2; patient presents for follow-up. She continues to use [facility] with [facility] under compression with no  issues. She denies signs of infection. 3/16; patient presents for follow-up. She continues to use [facility] with [facility] under compression with no issues. Home health change the dressing last week. She denies signs of infection. 3/23; patient presents for follow-up. She continues to use Keystone antibiotics with Hydrofera Blue under compression with no issues. Home health continues to change the dressings. She denies signs of infection. 3/30; patient presents for follow-up. She continues to use Keystone antibiotics with Hydrofera Blue under compression with no issues. Home health continues to change the dressings. 4/13 2-week follow-up. Keystone and Hydrofera Blue wound is roughly 2.3 cm in diameter smaller. No other issues 4/20; patient presents for follow-up. She continues to use Keystone antibiotic with [facility] with no issues. 4/28; patient with a difficult wound on her right medial lower leg. Using [facility] Blue minimal improvement per our intake nurse we are using compression. She has home health changing the dressing 5/5; patient presents for follow-up. We have been using Keystone antibiotics and Hydrofera Blue under compression therapy. She has no issues or complaints today. We discussed potentially using an advanced tissue product. She states she has had this done in the past and would like to proceed to see what her insurance would cover. 5/15; patient presents for follow-up. She has been using Keystone antibiotics and Hydrofera Blue under compression therapy. She forgot her Keystone antibiotics today. Insurance has not approved her for skin substitute. She currently denies signs of infection. She has home health that changes the dressing once a week. 5/19; patient presents for follow-up. We have been using Keystone antibiotic and Hydrofera Blue under compression therapy. She has no issues or complaints today. The [employer] rep confirmed over the phone that the  patient has been 100% approved for their skin substitute. We will verify this as we have not received any paperwork stating this. Patient states she would like to proceed with a skin substitute if it is covered by insurance. 6/1; patient presents for follow-up. We have been using Keystone antibiotic and Hydrofera Blue under compression therapy. We confirmed that she does have a co-pay for the [employer] skin substitute. We will hold off on using this for now since she continues to show improvement with current therapy. She denies signs of infection. She has  home health that comes out twice weekly. 6/16; patient presents for follow-up. We have been using Keystone antibiotic and Hydrofera Blue under compression therapy. She has no issues or complaints today. We discussed the potential trial of vendaje. This is placenta tissue. She states she would like to try the free trial. We will reach out to our rep. 6/30; patient presents for follow-up. She has been using Keystone antibiotic and Hydrofera Blue under compression therapy. She has been approved for a trial of vendaje. She would like to proceed with this and We will have this available at next clinic visit. She has no issues or complaints today. 7/18; patient presents for follow-up. She has been using Keystone antibiotic and Hydrofera Blue under compression. She has no issues or complaints today. We have a free sample of vendaje skin substitute and patient is agreeable to having this placed today. Electronic Signature(s) Signed: 06/08/2022 12:02:15 PM By: Kalman Shan DO Entered By: Kalman Shan on 06/08/2022 11:37:14 -------------------------------------------------------------------------------- Physical Exam Details Patient Name: Date of Service: SALIYAH, GILLIN [name] 06/08/2022 11:30 A M Medical Record Number: [id number] Patient Account Number: [id number] Date of Birth/Sex: Treating RN: [birth year] (88 y.o. F) Primary Care Provider: Marda Stalker Other Clinician: Referring Provider: Treating Provider/Extender: Aviva Signs in Treatment: 30 Constitutional respirations regular, non-labored and within target range for patient.. Cardiovascular 2+ dorsalis pedis/posterior tibialis pulses. Psychiatric pleasant and cooperative. Notes Left lower extremity: T the lateral aspect there is an open wound with granulation tissue and nonviable tissue present. No surrounding signs of infection. o Electronic Signature(s) Signed: 06/08/2022 12:02:15 PM By: Kalman Shan DO Entered By: Kalman Shan on 06/08/2022 11:37:40 -------------------------------------------------------------------------------- Physician Orders Details Patient Name: Date of Service: WELTHA, CATHY [name] 06/08/2022 11:30 A M Medical Record Number: [id number] Patient Account Number: [id number] Date of Birth/Sex: Treating RN: 1925-11-02 (88 y.o. [name], [name]: Marda Stalker Other Clinician: Referring Provider: Treating Provider/Extender: Aviva Signs in Treatment: 34 Verbal / Phone Orders: No Diagnosis Coding Follow-up Appointments ppointment in 1 week. - 06/14/22 @ 0815 w/ Dr. Heber [place] and [facility] Room # 8 Return A Then put back on Tuesday's with Dr. Heber [place] and Allayne Butcher Bathing/ Shower/ Hygiene May shower with protection but do not get wound dressing(s) wet. - use cast protector. May shower and wash wound with soap and water. - with dressing changes may wash with soap and water. Edema Control - Lymphedema / SCD / Other Elevate legs to the level of the heart or above for 30 minutes daily and/or when sitting, a frequency of: Avoid standing for long periods of time. Patient to wear own compression stockings every day. - apply in the morning and remove at night to right leg. Exercise regularly Moisturize legs daily. - every night before day right leg. Additional Orders /  Instructions Follow Nutritious Diet - High Protein Diet Home Health New wound care orders this week; continue Home Health for wound care. May utilize formulary equivalent dressing for wound treatment orders unless otherwise specified. - Home Health for skilled wound care nursing once a week, wound care once a week ***SKIN SUB PLACED; DO NOT REMOVE; DO NOT GET WET ONLY TAKE OFF OUTER DRESSING AND RE-WRAP**** ; NO ZINC OXIDE!! Other Home Health Orders/Instructions: [facility] home health Wound Treatment Wound #26 - Ankle Wound Laterality: Left, Lateral Cleanser: Soap and Water (Home Health) 2 x Per Week/30 Days Discharge Instructions: May shower and wash wound with dial antibacterial soap and water prior to dressing change. Peri-Wound  Care: Sween Lotion (Moisturizing lotion) (Home Health) 2 x Per Week/30 Days Discharge Instructions: Apply moisturizing lotion as directed Prim Dressing: VENDAJE SKIN SUB, ADAPTIC, STERI STRIPS ary 2 x Per Week/30 Days Secondary Dressing: Zetuvit Plus 4x8 in (Home Health) 2 x Per Week/30 Days Discharge Instructions: Apply over primary dressing as directed. Compression Wrap: ThreePress (3 layer compression wrap) (Home Health) 2 x Per Week/30 Days Discharge Instructions: Apply three layer compression as directed. Electronic Signature(s) Signed: 06/08/2022 12:02:15 PM By: Kalman Shan DO Signed: 06/08/2022 4:03:03 PM By: [name] Entered By: Rhae Hammock on 06/08/2022 11:43:56 -------------------------------------------------------------------------------- Problem List Details Patient Name: Date of Service: ADRIEL, DESROSIER [name] 06/08/2022 11:30 A M Medical Record Number: [id number] Patient Account Number: [id number] Date of Birth/Sex: Treating RN: 01-24-1925 (88 y.o. F) Primary Care Provider: Marda Stalker Other Clinician: Referring Provider: Treating Provider/Extender: Aviva Signs in Treatment: 27 Active  Problems ICD-10 Encounter Code Description Active Date MDM Diagnosis I87.312 Chronic venous hypertension (idiopathic) with ulcer of left lower extremity 11/27/2021 No Yes L97.822 Non-pressure chronic ulcer of other part of left lower leg with fat layer exposed1/04/2022 No Yes C50.919 Malignant neoplasm of unspecified site of unspecified female breast 11/27/2021 No Yes I50.42 Chronic combined systolic (congestive) and diastolic (congestive) heart failure 11/27/2021 No Yes G30.9 Alzheimer's disease, unspecified 11/27/2021 No Yes C54.1 Malignant neoplasm of endometrium 11/27/2021 No Yes Inactive Problems Resolved Problems Electronic Signature(s) Signed: 06/08/2022 12:02:15 PM By: Kalman Shan DO Entered By: Kalman Shan on 06/08/2022 11:36:01 -------------------------------------------------------------------------------- Progress Note Details Patient Name: Date of Service: DENICA, WEB [name] 06/08/2022 11:30 A M Medical Record Number: [id number] Patient Account Number: [id number] Date of Birth/Sex: Treating RN: 10-23-1925 (88 y.o. F) Primary Care Provider: Marda Stalker Other Clinician: Referring Provider: Treating Provider/Extender: Aviva Signs in Treatment: 22 Subjective Chief Complaint Information obtained from Patient Left lower extremity wound History of Present Illness (HPI) this is a patient we know from several prior wounds on her bilateral lower extremities. She has venous stasis physiology, inflammation and hypertension. She is been fully evaluated for venous ablation and was found not to be a candidate. She does not have significant arterial disease. 02/14/15 the wound itself on her lateral left leg did not look too bad however there was surrounding maceration which was concerning 02/27/15 the wound itself is small with surrounding circumferential epithelialization there is no surrounding maceration. 03/06/15 only a very small area remains. I think  this is mostly epithelialized however I would be uncomfortable not dressing this this week 03/13/15 the area is epithelialized. There was a thick surface on this. I took a #15 blade then lightly disrupted it but there does not appear to be any open area I did not see anything but appropriate epithelium Readmission: 08/16/18 on evaluation today patient presents for initial evaluation and our clinic released readmission although she has not been seen since April 2016. Nonetheless she is having issues with the ulcers currently on the bilateral lateral malleolus or locations as well as the left lateral lower extremity. These have been present for roughly 3 weeks since the patient actually developed significant bilateral lower extremity edema which patient's daughter states was roughly 3 times the size of her legs currently. Subsequently she ended up in the emerging department due to congestive heart failure. They were able to get the swelling under control and fortunately she seems to be doing much better at this point in time. She was placed in an [employer] which she sat on for  week part evaluation today as well. Fortunately the wound areas actually seem to be doing fairly well there is some macerated skin surrounding there are the areas/surface of the wound which are gonna require some debridement at this point. No fevers, chills, nausea, or vomiting noted at this time. Patient has a history of hypertension, mild peripheral vascular disease, chronic venous stasis, and other than the ulcer seems to be doing fairly well today. No fevers, chills, nausea, or vomiting noted at this time. 08/23/18 on evaluation today patient actually appears to be doing great in regard to her bilateral lower should be ulcers. She's shown signs of improvement even just with one week with the [employer]. In general I'm actually very pleased with how things appear at this point. 09/06/18 on evaluation today patient  actually appears to be doing much better her left lower extremity is completely healed. Go to the right lower extremity lateral malleolus ulcer this still seems to be showing signs of being open. There does not appear to be evidence of infection which is good news. This did require some sharp debridement however to remove some of the necrotic tissue/eschar on the surface of the wound Readmission 11/27/2021 Ms. Amyria Komar is a 88 year old female with a past medical history of endometrial and breast cancer, chronic venous insufficiency, dementia and essential hypertension that presents to the clinic with a 1 month history of nonhealing ulcer to the left lower extremity. She states that the ulcer opened 1 month ago and then healed but has reopened again in the past week. She is currently keeping the area covered. She has a history of wounds to her lower extremities. She has compression stockings that she uses daily. She currently denies signs of infection. 1/13; patient presents for follow-up. She followed up for nurse visit for wrap change. She reports more odor. She denies pain. 1/20; patient presents for follow-up. She has been using gentamicin ointment with calcium alginate to the wound bed. She reports improvement in odor and drainage. She currently denies systemic signs of infection. 1/27; patient presents for follow-up. She has been using gentamicin ointment with calcium alginate. She received her Keystone antibiotic 2 days ago. She brought this in. Home health has also been established. They will come out for the first time next week. She currently denies signs of infection. She has no issues or complaints today. 2/2; patient presents for follow-up. She has been using Keystone antibiotics with [facility] with no issues. She denies signs of infection. She has no issues or complaints today. 2/9; patient presents for follow-up. She has been using Keystone antibiotics with [facility]  with no issues. She has tolerated the compression wrap well. She denies signs of infection. 2/16; patient presents for follow-up. She has been using Keystone antibiotics with Hydrofera Blue under compression wrap with no issues. She currently denies signs of infection. 2/23; patient presents for follow-up. She continues to use Keystone antibiotics with Hydrofera Blue under the compression wrap. She has no issues or complaints today. She denies signs of infection. Home health continues to come out and change the wrap. 3/2; patient presents for follow-up. She continues to use [facility] with [facility] under compression with no issues. She denies signs of infection. 3/16; patient presents for follow-up. She continues to use [facility] with [facility] under compression with no issues. Home health change the dressing last week. She denies signs of infection. 3/23; patient presents for follow-up. She continues to use Keystone antibiotics with Hydrofera Blue under  compression with no issues. Home health continues to change the dressings. She denies signs of infection. 3/30; patient presents for follow-up. She continues to use Keystone antibiotics with Hydrofera Blue under compression with no issues. Home health continues to change the dressings. 4/13 2-week follow-up. Keystone and Hydrofera Blue wound is roughly 2.3 cm in diameter smaller. No other issues 4/20; patient presents for follow-up. She continues to use Keystone antibiotic with [name] with no issues. 4/28; patient with a difficult wound on her right medial lower leg. Using [facility] Blue minimal improvement per our intake nurse we are using compression. She has home health changing the dressing 5/5; patient presents for follow-up. We have been using Keystone antibiotics and Hydrofera Blue under compression therapy. She has no issues or complaints today. We discussed potentially using an advanced tissue product. She  states she has had this done in the past and would like to proceed to see what her insurance would cover. 5/15; patient presents for follow-up. She has been using Keystone antibiotics and Hydrofera Blue under compression therapy. She forgot her Keystone antibiotics today. Insurance has not approved her for skin substitute. She currently denies signs of infection. She has home health that changes the dressing once a week. 5/19; patient presents for follow-up. We have been using Keystone antibiotic and Hydrofera Blue under compression therapy. She has no issues or complaints today. The [employer] rep confirmed over the phone that the patient has been 100% approved for their skin substitute. We will verify this as we have not received any paperwork stating this. Patient states she would like to proceed with a skin substitute if it is covered by insurance. 6/1; patient presents for follow-up. We have been using Keystone antibiotic and Hydrofera Blue under compression therapy. We confirmed that she does have a co-pay for the [employer] skin substitute. We will hold off on using this for now since she continues to show improvement with current therapy. She denies signs of infection. She has home health that comes out twice weekly. 6/16; patient presents for follow-up. We have been using Keystone antibiotic and Hydrofera Blue under compression therapy. She has no issues or complaints today. We discussed the potential trial of vendaje. This is placenta tissue. She states she would like to try the free trial. We will reach out to our rep. 6/30; patient presents for follow-up. She has been using Keystone antibiotic and Hydrofera Blue under compression therapy. She has been approved for a trial of vendaje. She would like to proceed with this and We will have this available at next clinic visit. She has no issues or complaints today. 7/18; patient presents for follow-up. She has been using Keystone antibiotic and  Hydrofera Blue under compression. She has no issues or complaints today. We have a free sample of vendaje skin substitute and patient is agreeable to having this placed today. Patient History Information obtained from Patient. Family History Cancer - Child, Hypertension - Child,Father, Kidney Disease - Child, Stroke - Siblings,Child, Thyroid Problems - Child, No family history of Diabetes, Heart Disease, Hereditary Spherocytosis, Lung Disease, Seizures, Tuberculosis. Social History Never smoker, Marital Status - Widowed, Alcohol Use - Never, Drug Use - No History, Caffeine Use - Never. Medical History Eyes Denies history of Cataracts, Glaucoma, Optic Neuritis Ear/Nose/Mouth/Throat Denies history of Chronic sinus problems/congestion, Middle ear problems Hematologic/Lymphatic Patient has history of Anemia Denies history of Hemophilia, Human Immunodeficiency Virus, Lymphedema, Sickle Cell Disease Respiratory Denies history of Aspiration, Asthma, Chronic Obstructive Pulmonary Disease (COPD), Pneumothorax,  Sleep Apnea, Tuberculosis Cardiovascular Patient has history of Congestive Heart Failure, Hypertension, Peripheral Venous Disease Denies history of Angina, Arrhythmia, Coronary Artery Disease, Hypotension, Myocardial Infarction, Peripheral Arterial Disease, Phlebitis, Vasculitis Gastrointestinal Denies history of Cirrhosis , Colitis, Crohnoos, Hepatitis A, Hepatitis B, Hepatitis C Endocrine Denies history of Type I Diabetes, Type II Diabetes Genitourinary Denies history of End Stage Renal Disease Immunological Denies history of Lupus Erythematosus, Raynaudoos, Scleroderma Integumentary (Skin) Denies history of History of Burn Musculoskeletal Patient has history of Osteoarthritis Denies history of Gout, Rheumatoid Arthritis, Osteomyelitis Neurologic Denies history of Neuropathy, Quadriplegia, Paraplegia, Seizure Disorder Oncologic Patient has history of Received Chemotherapy -  Breast and Ovarian Cancer Denies history of Received Radiation Hospitalization/Surgery History - diverticulits. Objective Constitutional respirations regular, non-labored and within target range for patient.. Vitals Time Taken: 10:45 AM, Height: 66 in, Weight: 176 lbs, BMI: 28.4, Temperature: 97.7 F, Pulse: 65 bpm, Respiratory Rate: 18 breaths/min, Blood Pressure: 143/68 mmHg. Cardiovascular 2+ dorsalis pedis/posterior tibialis pulses. Psychiatric pleasant and cooperative. General Notes: Left lower extremity: T the lateral aspect there is an open wound with granulation tissue and nonviable tissue present. No surrounding signs of o infection. Integumentary (Hair, Skin) Wound #26 status is Open. Original cause of wound was Gradually Appeared. The date acquired was: 11/20/2021. The wound has been in treatment 27 weeks. The wound is located on the Left,Lateral Ankle. The wound measures 1.8cm length x 1.4cm width x 0.1cm depth; 1.979cm^2 area and 0.198cm^3 volume. There is Fat Layer (Subcutaneous Tissue) exposed. There is no tunneling or undermining noted. There is a medium amount of serosanguineous drainage noted. The wound margin is distinct with the outline attached to the wound base. There is large (67-100%) red granulation within the wound bed. There is a small (1-33%) amount of necrotic tissue within the wound bed including Adherent Slough. Assessment Active Problems ICD-10 Chronic venous hypertension (idiopathic) with ulcer of left lower extremity Non-pressure chronic ulcer of other part of left lower leg with fat layer exposed Malignant neoplasm of unspecified site of unspecified female breast Chronic combined systolic (congestive) and diastolic (congestive) heart failure Alzheimer's disease, unspecified Malignant neoplasm of endometrium Patient's wound has shown improvement in size and appearance since last clinic visit. I debrided nonviable tissue. Vandaje Skin substitute was  placed in standard fashion today. We will continue compression therapy. Follow-up in 1 week. Procedures Wound #26 Pre-procedure diagnosis of Wound #26 is a Venous Leg Ulcer located on the Left,Lateral Ankle .Severity of Tissue Pre Debridement is: Fat layer exposed. There was a Excisional Skin/Subcutaneous Tissue Debridement with a total area of 2.52 sq cm performed by Kalman Shan, DO. With the following instrument(s): Curette to remove Viable and Non-Viable tissue/material. Material removed includes Subcutaneous Tissue and Slough and after achieving pain control using Lidocaine. No specimens were taken. A time out was conducted at 11:26, prior to the start of the procedure. A Minimum amount of bleeding was controlled with Pressure. The procedure was tolerated well with a pain level of 0 throughout and a pain level of 0 following the procedure. Post Debridement Measurements: 1.8cm length x 1.4cm width x 0.1cm depth; 0.198cm^3 volume. Character of Wound/Ulcer Post Debridement is improved. Severity of Tissue Post Debridement is: Fat layer exposed. Post procedure Diagnosis Wound #26: Same as Pre-Procedure Pre-procedure diagnosis of Wound #26 is a Venous Leg Ulcer located on the Left,Lateral Ankle. A skin graft procedure using a bioengineered skin substitute/cellular or tissue based product was performed by Kalman Shan, DO with the following instrument(s): Forceps. Other was applied  and secured with Steri-Strips. 8 sq cm of product was utilized and 0 sq cm was wasted. Post Application, Adaptic was applied. A Time Out was conducted at 11:33, prior to the start of the procedure. The procedure was tolerated well with a pain level of 0 throughout and a pain level of 0 following the procedure. Post procedure Diagnosis Wound #26: Same as Pre-Procedure . Pre-procedure diagnosis of Wound #26 is a Venous Leg Ulcer located on the Left,Lateral Ankle . There was a Three Layer Compression Therapy Procedure  by Rhae Hammock, RN. Post procedure Diagnosis Wound #26: Same as Pre-Procedure Plan Follow-up Appointments: Return Appointment in 1 week. - 06/14/22 @ 0815 w/ Dr. Heber [place] and [facility] Room # 8 Then put back on Tuesday's with Dr. Heber [place] and Allayne Butcher Bathing/ Shower/ Hygiene: May shower with protection but do not get wound dressing(s) wet. - use cast protector. May shower and wash wound with soap and water. - with dressing changes may wash with soap and water. Edema Control - Lymphedema / SCD / Other: Elevate legs to the level of the heart or above for 30 minutes daily and/or when sitting, a frequency of: Avoid standing for long periods of time. Patient to wear own compression stockings every day. - apply in the morning and remove at night to right leg. Exercise regularly Moisturize legs daily. - every night before day right leg. Additional Orders / Instructions: Follow Nutritious Diet - High Protein Diet Home Health: New wound care orders this week; continue Home Health for wound care. May utilize formulary equivalent dressing for wound treatment orders unless otherwise specified. - Home Health for skilled wound care nursing twice a week ***SKIN SUB PLACED; DO NOT REMOVE; DO NOT GET WET ONLY TAKE; OFF OUTER DRESSING AND RE-WRAP**** NO ZINC OXIDE!! Other Home Health Orders/Instructions: [facility] home health WOUND #26: - Ankle Wound Laterality: Left, Lateral Cleanser: Soap and Water (Home Health) 2 x Per Week/30 Days Discharge Instructions: May shower and wash wound with dial antibacterial soap and water prior to dressing change. Peri-Wound Care: Sween Lotion (Moisturizing lotion) (Home Health) 2 x Per Week/30 Days Discharge Instructions: Apply moisturizing lotion as directed Prim Dressing: VENDAJE SKIN SUB, ADAPTIC, STERI STRIPS 2 x Per Week/30 Days ary Secondary Dressing: Zetuvit Plus 4x8 in (Home Health) 2 x Per Week/30 Days Discharge Instructions: Apply over primary dressing as  directed. Com pression Wrap: ThreePress (3 layer compression wrap) (Home Health) 2 x Per Week/30 Days Discharge Instructions: Apply three layer compression as directed. 1. In office sharp debridement 2. Vandaje free trial was placed in standard fashion today 3. Follow-up in 1 week Electronic Signature(s) Signed: 06/08/2022 12:02:15 PM By: Kalman Shan DO Entered By: Kalman Shan on 06/08/2022 11:38:53 -------------------------------------------------------------------------------- HxROS Details Patient Name: Date of Service: KATEENA, DEGROOTE [name] 06/08/2022 11:30 A M Medical Record Number: [id number] Patient Account Number: [id number] Date of Birth/Sex: Treating RN: 1925-10-05 (88 y.o. F) Primary Care Provider: Marda Stalker Other Clinician: Referring Provider: Treating Provider/Extender: Aviva Signs in Treatment: 27 Information Obtained From Patient Eyes Medical History: Negative for: Cataracts; Glaucoma; Optic Neuritis Ear/Nose/Mouth/Throat Medical History: Negative for: Chronic sinus problems/congestion; Middle ear problems Hematologic/Lymphatic Medical History: Positive for: Anemia Negative for: Hemophilia; Human Immunodeficiency Virus; Lymphedema; Sickle Cell Disease Respiratory Medical History: Negative for: Aspiration; Asthma; Chronic Obstructive Pulmonary Disease (COPD); Pneumothorax; Sleep Apnea; Tuberculosis Cardiovascular Medical History: Positive for: Congestive Heart Failure; Hypertension; Peripheral Venous Disease Negative for: Angina; Arrhythmia; Coronary Artery Disease; Hypotension; Myocardial Infarction; Peripheral Arterial Disease; Phlebitis; Vasculitis Gastrointestinal  Medical History: Negative for: Cirrhosis ; Colitis; Crohns; Hepatitis A; Hepatitis B; Hepatitis C Endocrine Medical History: Negative for: Type I Diabetes; Type II Diabetes Genitourinary Medical History: Negative for: End Stage Renal  Disease Immunological Medical History: Negative for: Lupus Erythematosus; Raynauds; Scleroderma Integumentary (Skin) Medical History: Negative for: History of Burn Musculoskeletal Medical History: Positive for: Osteoarthritis Negative for: Gout; Rheumatoid Arthritis; Osteomyelitis Neurologic Medical History: Negative for: Neuropathy; Quadriplegia; Paraplegia; Seizure Disorder Oncologic Medical History: Positive for: Received Chemotherapy - Breast and Ovarian Cancer Negative for: Received Radiation Immunizations Pneumococcal Vaccine: Received Pneumococcal Vaccination: Yes Received Pneumococcal Vaccination On or After 60th Birthday: Yes Implantable Devices None Hospitalization / Surgery History Type of Hospitalization/Surgery diverticulits Family and Social History Cancer: Yes - Child; Diabetes: No; Heart Disease: No; Hereditary Spherocytosis: No; Hypertension: Yes - Child,Father; Kidney Disease: Yes - Child; Lung Disease: No; Seizures: No; Stroke: Yes - Siblings,Child; Thyroid Problems: Yes - Child; Tuberculosis: No; Never smoker; Marital Status - Widowed; Alcohol Use: Never; Drug Use: No History; Caffeine Use: Never; Financial Concerns: No; Food, Clothing or Shelter Needs: No; Support System Lacking: No; Transportation Concerns: No Electronic Signature(s) Signed: 06/08/2022 12:02:15 PM By: Kalman Shan DO Entered By: Kalman Shan on 06/08/2022 11:37:20 -------------------------------------------------------------------------------- SuperBill Details Patient Name: Date of Service: KIRBI, FARRUGIA [name] 06/08/2022 Medical Record Number: [id number] Patient Account Number: [id number] Date of Birth/Sex: Treating RN: 06-May-1925 (88 y.o. F) Primary Care Provider: Marda Stalker Other Clinician: Referring Provider: Treating Provider/Extender: Aviva Signs in Treatment: 27 Diagnosis Coding ICD-10 Codes Code Description [phone] Chronic venous  hypertension (idiopathic) with ulcer of left lower extremity L97.822 Non-pressure chronic ulcer of other part of left lower leg with fat layer exposed C50.919 Malignant neoplasm of unspecified site of unspecified female breast I50.42 Chronic combined systolic (congestive) and diastolic (congestive) heart failure G30.9 Alzheimer's disease, unspecified C54.1 Malignant neoplasm of endometrium Facility Procedures CPT4 Code: [id number] Description: [place] - DEB SUBQ TISSUE 20 SQ CM/< ICD-10 Diagnosis Description L97.822 Non-pressure chronic ulcer of other part of left lower leg with fat layer expo Modifier: sed Quantity: 1 Physician Procedures : CPT4 Code Description Modifier [id number] - WC PHYS SUBQ TISS 20 SQ CM ICD-10 Diagnosis Description L97.822 Non-pressure chronic ulcer of other part of left lower leg with fat layer exposed Quantity: 1 Notes vendaje skin sub donated Electronic Signature(s) Signed: 06/08/2022 12:02:15 PM By: Kalman Shan DO Signed: 06/08/2022 4:03:03 PM By: [name] Entered By: Rhae Hammock on 06/08/2022 11:59:39

## 2022-06-09 LAB — BASIC METABOLIC PANEL
BUN/Creatinine Ratio: 24 (ref 12–28)
BUN: 35 mg/dL (ref 10–36)
CO2: 24 mmol/L (ref 20–29)
Calcium: 9.5 mg/dL (ref 8.7–10.3)
Chloride: 106 mmol/L (ref 96–106)
Creatinine, Ser: 1.45 mg/dL — ABNORMAL HIGH (ref 0.57–1.00)
Glucose: 80 mg/dL (ref 70–99)
Potassium: 4.2 mmol/L (ref 3.5–5.2)
Sodium: 143 mmol/L (ref 134–144)
eGFR: 33 mL/min/{1.73_m2} — ABNORMAL LOW (ref >59)

## 2022-06-11 NOTE — Progress Notes (Signed)
Pt is aware.  

## 2022-06-14 ENCOUNTER — Encounter (HOSPITAL_BASED_OUTPATIENT_CLINIC_OR_DEPARTMENT_OTHER): Payer: Medicare PPO | Admitting: Internal Medicine

## 2022-06-14 DIAGNOSIS — I5042 Chronic combined systolic (congestive) and diastolic (congestive) heart failure: Secondary | ICD-10-CM | POA: Diagnosis not present

## 2022-06-14 DIAGNOSIS — L97822 Non-pressure chronic ulcer of other part of left lower leg with fat layer exposed: Secondary | ICD-10-CM | POA: Diagnosis not present

## 2022-06-14 DIAGNOSIS — I87312 Chronic venous hypertension (idiopathic) with ulcer of left lower extremity: Secondary | ICD-10-CM | POA: Diagnosis not present

## 2022-06-14 DIAGNOSIS — Z8542 Personal history of malignant neoplasm of other parts of uterus: Secondary | ICD-10-CM | POA: Diagnosis not present

## 2022-06-14 DIAGNOSIS — Z853 Personal history of malignant neoplasm of breast: Secondary | ICD-10-CM | POA: Diagnosis not present

## 2022-06-14 DIAGNOSIS — F0280 Dementia in other diseases classified elsewhere without behavioral disturbance: Secondary | ICD-10-CM | POA: Diagnosis not present

## 2022-06-14 DIAGNOSIS — G309 Alzheimer's disease, unspecified: Secondary | ICD-10-CM | POA: Diagnosis not present

## 2022-06-14 NOTE — Progress Notes (Signed)
[name], [name] [name] ([id number]) Visit Report for 06/14/2022 Arrival Information Details Patient Name: Date of Service: [name], [name] [name] 06/14/2022 8:15 A M Medical Record Number: [id number] Patient Account Number: [id number] Date of Birth/Sex: Treating RN: 1924-12-18 (88 y.o. [name] [name] [name], Meta.Reding Primary Care [name] [name] [name]: [name] [name] [name] Other Clinician: Referring [name] [name] [name]: Treating Mersadie Kavanaugh/Extender: [name] [name] Signs in Treatment: 28 Visit Information History Since Last Visit Added or deleted any medications: No Patient Arrived: Wheel Chair Any new allergies or adverse reactions: No Arrival Time: 08:05 Had a fall or experienced change in No Accompanied By: daughter activities of daily living that may affect Transfer Assistance: None risk of falls: Patient Identification Verified: Yes Signs or symptoms of abuse/neglect since last visito No Secondary Verification Process Completed: Yes Hospitalized since last visit: No Patient Requires Transmission-Based Precautions: No Implantable device outside of the clinic excluding No Patient Has Alerts: No cellular tissue based products placed in the center since last visit: Has Dressing in Place as Prescribed: Yes Has Compression in Place as Prescribed: Yes Pain Present Now: No Electronic Signature(s) Signed: 06/14/2022 4:56:39 PM By: [name] Pilling RN, BSN Entered By: [name] [name] [name] on 06/14/2022 08:15:22 -------------------------------------------------------------------------------- Compression Therapy Details Patient Name: Date of Service: [name], [name] [name] [name] [name] 06/14/2022 8:15 A M Medical Record Number: [id number] Patient Account Number: [id number] Date of Birth/Sex: Treating RN: [birth year] (88 y.o. [name] [name] [name] Primary Care Shuna Tabor: [name] [name] [name] Other Clinician: Referring Daphnie Venturini: Treating Itzamar Traynor/Extender: [name] [name] Signs in Treatment: 28 Compression Therapy Performed for Wound  Assessment: Wound #26 Left,Lateral Ankle Performed By: Clinician [name] Pilling, RN Compression Type: Three Layer Post Procedure Diagnosis Same as Pre-procedure Electronic Signature(s) Signed: 06/14/2022 4:56:39 PM By: [name] Pilling RN, BSN Entered By: [name] [name] [name] on 06/14/2022 08:44:29 -------------------------------------------------------------------------------- Encounter Discharge Information Details Patient Name: Date of Service: [name], [name] [name] [name] [name] 06/14/2022 8:15 A M Medical Record Number: [id number] Patient Account Number: [id number] Date of Birth/Sex: Treating RN: 04-Dec-1924 (88 y.o. [name] [name] [name], [name] [name] [name] Primary Care Jovonda Selner: [name] [name] [name] Other Clinician: Referring Aniqa Hare: Treating Ajwa Kimberley/Extender: [name] [name] Signs in Treatment: 28 Encounter Discharge Information Items Post Procedure Vitals Discharge Condition: Stable Temperature (F): 98 Ambulatory Status: Wheelchair Pulse (bpm): 55 Discharge Destination: Home Respiratory Rate (breaths/min): 18 Transportation: Private Auto Blood Pressure (mmHg): 136/75 Accompanied By: daughter Schedule Follow-up Appointment: Yes Clinical Summary of Care: Electronic Signature(s) Signed: 06/14/2022 4:56:39 PM By: [name] Pilling RN, BSN Entered By: [name] [name] [name] on 06/14/2022 08:50:56 -------------------------------------------------------------------------------- Lower Extremity Assessment Details Patient Name: Date of Service: [name], [name] [name] [name] [name] 06/14/2022 8:15 A M Medical Record Number: [id number] Patient Account Number: [id number] Date of Birth/Sex: Treating RN: Aug 04, 1925 (88 y.o. [name] [name] [name], [name] [name] [name] Primary Care Aurelie Dicenzo: [name] [name] [name] Other Clinician: Referring Rogena Deupree: Treating Nykerria Macconnell/Extender: [name] [name] [name] Weeks in Treatment: 28 Edema Assessment Assessed: [name] [name] [name]: Yes] [name] [name] [name]: No] Edema: [Left: N] [Right: o] Calf Left: Right: Point of Measurement: 30 cm From Medial  Instep 29.5 cm Ankle Left: Right: Point of Measurement: 11 cm From Medial Instep 19.5 cm Vascular Assessment Pulses: Dorsalis Pedis Palpable: [Left:Yes] Electronic Signature(s) Signed: 06/14/2022 4:56:39 PM By: [name] Pilling RN, BSN Entered By: [name] [name] [name] on 06/14/2022 08:15:53 -------------------------------------------------------------------------------- Multi Wound Chart Details Patient Name: Date of Service: [name], [name] [name] [name] [name] 06/14/2022 8:15 A M Medical Record Number: [id number] Patient Account Number: [id number] Date of Birth/Sex: Treating RN: Nov 07, 1925 (88 y.o. [name] [name] [name] Primary Care Mingo Siegert: [name] [name] [name] Other Clinician: Referring Khiry Pasquariello: Treating Kajuana Shareef/Extender: [name] [name] [name] Weeks in Treatment: 28 Vital Signs Height(in): 66 Pulse(bpm): 55 Weight(lbs): 176 Blood Pressure(mmHg): 136/75 Body Mass  Index(BMI): 28.4 Temperature(F): 98 Respiratory Rate(breaths/min): 18 Photos: [N/A:N/A] Left, Lateral Ankle N/A N/A Wound Location: Gradually Appeared N/A N/A Wounding Event: Venous Leg Ulcer N/A N/A Primary Etiology: Anemia, Congestive Heart Failure, N/A N/A Comorbid History: Hypertension, Peripheral Venous Disease, Osteoarthritis, Received Chemotherapy 11/20/2021 N/A N/A Date Acquired: 63 N/A N/A Weeks of Treatment: Open N/A N/A Wound Status: No N/A N/A Wound Recurrence: 1.6x0.9x0.1 N/A N/A Measurements L x W x D (cm) 1.131 N/A N/A A (cm) : rea 0.113 N/A N/A Volume (cm) : 88.00% N/A N/A % Reduction in A rea: 88.00% N/A N/A % Reduction in Volume: Full Thickness Without Exposed N/A N/A Classification: Support Structures Medium N/A N/A Exudate A mount: Serosanguineous N/A N/A Exudate Type: red, brown N/A N/A Exudate Color: Distinct, outline attached N/A N/A Wound Margin: Large (67-100%) N/A N/A Granulation A mount: Red N/A N/A Granulation Quality: Small (1-33%) N/A N/A Necrotic A mount: Fat  Layer (Subcutaneous Tissue): Yes N/A N/A Exposed Structures: Fascia: No Tendon: No Muscle: No Joint: No Bone: No Medium (34-66%) N/A N/A Epithelialization: Debridement - Excisional N/A N/A Debridement: Pre-procedure Verification/Time Out 08:40 N/A N/A Taken: Lidocaine 5% topical ointment N/A N/A Pain Control: Subcutaneous, Slough N/A N/A Tissue Debrided: Skin/Subcutaneous Tissue N/A N/A Level: 2.4 N/A N/A Debridement A (sq cm): rea Curette N/A N/A Instrument: Minimum N/A N/A Bleeding: Pressure N/A N/A Hemostasis A chieved: 0 N/A N/A Procedural Pain: 0 N/A N/A Post Procedural Pain: Procedure was tolerated well N/A N/A Debridement Treatment Response: 1.6x0.9x0.1 N/A N/A Post Debridement Measurements L x W x D (cm) 0.113 N/A N/A Post Debridement Volume: (cm) Cellular or Tissue Based Product N/A N/A Procedures Performed: Compression Therapy Debridement Treatment Notes Wound #26 (Ankle) Wound Laterality: Left, Lateral Cleanser Soap and Water Discharge Instruction: May shower and wash wound with dial antibacterial soap and water prior to dressing change. Peri-Wound Care Sween Lotion (Moisturizing lotion) Discharge Instruction: Apply moisturizing lotion as directed Topical Primary Dressing VENDAJE SKIN SUB, ADAPTIC, STERI STRIPS Secondary Dressing Zetuvit Plus 4x8 in Discharge Instruction: Apply over primary dressing as directed. Secured With Compression Wrap ThreePress (3 layer compression wrap) Discharge Instruction: Apply three layer compression as directed. Compression Stockings Add-Ons Electronic Signature(s) Signed: 06/14/2022 10:17:40 AM By: Kalman Shan DO Signed: 06/14/2022 4:56:39 PM By: [name] Pilling RN, BSN Entered By: Kalman Shan on 06/14/2022 09:00:26 -------------------------------------------------------------------------------- Multi-Disciplinary Care Plan Details Patient Name: Date of Service: [name], [name] [name] [name] [name] 06/14/2022 8:15  A M Medical Record Number: [id number] Patient Account Number: [id number] Date of Birth/Sex: Treating RN: 1925/07/02 (88 y.o. [name] [name] [name], [name] [name] [name] Primary Care Amore Ackman: [name] [name] [name] Other Clinician: Referring Tannia Contino: Treating Netra Postlethwait/Extender: [name] [name] Signs in Treatment: 28 Active Inactive Nutrition Nursing Diagnoses: Imbalanced nutrition Goals: Patient/caregiver agrees to and verbalizes understanding of need to use nutritional supplements and/or vitamins as prescribed Date Initiated: 11/27/2021 Target Resolution Date: 06/26/2022 Goal Status: Active Interventions: Assess patient nutrition upon admission and as needed per policy Provide education on nutrition Treatment Activities: Education provided on Nutrition : 06/08/2022 Notes: Wound/Skin Impairment Nursing Diagnoses: Impaired tissue integrity Goals: Patient/caregiver will verbalize understanding of skin care regimen Date Initiated: 11/27/2021 Target Resolution Date: 06/26/2022 Goal Status: Active Ulcer/skin breakdown will have a volume reduction of 30% by week 4 Date Initiated: 11/27/2021 Date Inactivated: 12/31/2021 Target Resolution Date: 12/25/2021 Unmet Reason: according to Goal Status: Unmet measurements has not reduced. Interventions: Assess patient/caregiver ability to obtain necessary supplies Assess patient/caregiver ability to perform ulcer/skin care regimen upon admission and as needed Assess ulceration(s) every visit Provide education on ulcer and  skin care Treatment Activities: Topical wound management initiated : 11/27/2021 Notes: Electronic Signature(s) Signed: 06/14/2022 4:56:39 PM By: [name] Pilling RN, BSN Entered By: [name] [name] [name] on 06/14/2022 08:17:16 -------------------------------------------------------------------------------- Pain Assessment Details Patient Name: Date of Service: JEANNEMARIE, SAWAYA [name] [name] 06/14/2022 8:15 A M Medical Record Number: [id number] Patient Account  Number: [id number] Date of Birth/Sex: Treating RN: [birth year] (88 y.o. [name] [name] [name] Primary Care Tynisha Ogan: [name] [name] [name] Other Clinician: Referring Lonia Roane: Treating Taurus Willis/Extender: [name] [name] Signs in Treatment: 28 Active Problems Location of Pain Severity and Description of Pain Patient Has Paino No Site Locations Rate the pain. Current Pain Level: 0 Pain Management and Medication Current Pain Management: Medication: No Cold Application: No Rest: No Massage: No Activity: No T.E.N.S.: No Heat Application: No Leg drop or elevation: No Is the Current Pain Management Adequate: Adequate How does your wound impact your activities of daily livingo Sleep: No Bathing: No Appetite: No Relationship With Others: No Bladder Continence: No Emotions: No Bowel Continence: No Work: No Toileting: No Drive: No Dressing: No Hobbies: No Engineer, maintenance) Signed: 06/14/2022 4:56:39 PM By: [name] Pilling RN, BSN Entered By: [name] [name] [name] on 06/14/2022 08:15:43 -------------------------------------------------------------------------------- Patient/Caregiver Education Details Patient Name: Date of Service: Thalia Party [name] [name] 7/24/2023andnbsp8:15 A M Medical Record Number: [id number] Patient Account Number: [id number] Date of Birth/Gender: Treating RN: 04-11-1925 (88 y.o. [name] [name] [name] Primary Care Physician: [name] [name] [name] Other Clinician: Referring Physician: Treating Physician/Extender: [name] [name] Signs in Treatment: 28 Education Assessment Education Provided To: Patient Education Topics Provided Wound/Skin Impairment: Handouts: Skin Care Do's and Dont's Methods: Explain/Verbal Responses: Reinforcements needed Electronic Signature(s) Signed: 06/14/2022 4:56:39 PM By: [name] Pilling RN, BSN Entered By: [name] [name] [name] on 06/14/2022  08:17:26 -------------------------------------------------------------------------------- Wound Assessment Details Patient Name: Date of Service: LUDMILA, EBARB [name] [name] 06/14/2022 8:15 A M Medical Record Number: [id number] Patient Account Number: [id number] Date of Birth/Sex: Treating RN: 03-Jun-1925 (88 y.o. [name] [name] [name], Meta.Reding Primary Care Elide Stalzer: [name] [name] [name] Other Clinician: Referring Kavonte Bearse: Treating Kache Mcclurg/Extender: [name] [name] [name] Weeks in Treatment: 28 Wound Status Wound Number: 26 Primary Venous Leg Ulcer Etiology: Wound Location: Left, Lateral Ankle Wound Open Wounding Event: Gradually Appeared Status: Status: Date Acquired: 11/20/2021 Comorbid Anemia, Congestive Heart Failure, Hypertension, Peripheral Weeks Of Treatment: 28 History: Venous Disease, Osteoarthritis, Received Chemotherapy Clustered Wound: No Photos Wound Measurements Length: (cm) 1.6 Width: (cm) 0.9 Depth: (cm) 0.1 Area: (cm) 1.131 Volume: (cm) 0.113 % Reduction in Area: 88% % Reduction in Volume: 88% Epithelialization: Medium (34-66%) Tunneling: No Undermining: No Wound Description Classification: Full Thickness Without Exposed Support Structures Wound Margin: Distinct, outline attached Exudate Amount: Medium Exudate Type: Serosanguineous Exudate Color: red, brown Foul Odor After Cleansing: No Slough/Fibrino Yes Wound Bed Granulation Amount: Large (67-100%) Exposed Structure Granulation Quality: Red Fascia Exposed: No Necrotic Amount: Small (1-33%) Fat Layer (Subcutaneous Tissue) Exposed: Yes Necrotic Quality: Adherent Slough Tendon Exposed: No Muscle Exposed: No Joint Exposed: No Bone Exposed: No Treatment Notes Wound #26 (Ankle) Wound Laterality: Left, Lateral Cleanser Soap and Water Discharge Instruction: May shower and wash wound with dial antibacterial soap and water prior to dressing change. Peri-Wound Care Sween Lotion (Moisturizing  lotion) Discharge Instruction: Apply moisturizing lotion as directed Topical Primary Dressing VENDAJE SKIN SUB, ADAPTIC, STERI STRIPS Secondary Dressing Zetuvit Plus 4x8 in Discharge Instruction: Apply over primary dressing as directed. Secured With Compression Wrap ThreePress (3 layer compression wrap) Discharge Instruction: Apply three layer compression as directed. Compression Stockings Add-Ons Electronic Signature(s) Signed: 06/14/2022 4:56:39 PM By: [name] Pilling RN, BSN Entered By: [name] [name] [name]  on 06/14/2022 08:16:59 -------------------------------------------------------------------------------- Vitals Details Patient Name: Date of Service: INAS, AVENA 06/14/2022 8:15 A M Medical Record Number: [id number] Patient Account Number: [id number] Date of Birth/Sex: Treating RN: 05-17-1925 (88 y.o. [name] [name] [name], [name] [name] [name] Primary Care Jeremy Mclamb: [name] [name] [name] Other Clinician: Referring Sheyanne Munley: Treating Natassja Ollis/Extender: [name] [name] Signs in Treatment: 28 Vital Signs Time Taken: 08:06 Temperature (F): 98 Height (in): 66 Pulse (bpm): 55 Weight (lbs): 176 Respiratory Rate (breaths/min): 18 Body Mass Index (BMI): 28.4 Blood Pressure (mmHg): 136/75 Reference Range: 80 - 120 mg / dl Electronic Signature(s) Signed: 06/14/2022 4:56:39 PM By: [name] Pilling RN, BSN Entered By: [name] [name] [name] on 06/14/2022 08:15:35

## 2022-06-14 NOTE — Progress Notes (Signed)
[name], [name] ([id number]) Visit Report for 06/14/2022 Chief Complaint Document Details Patient Name: Date of Service: [name], [name] 06/14/2022 8:15 A M Medical Record Number: [id number] Patient Account Number: [id number] Date of Birth/Sex: Treating RN: [birth year] (88 y.o. [name] [name] Primary Care Provider: Marda [name] Other Clinician: Referring Provider: Treating Provider/Extender: [name] [name] in Treatment: 28 Information Obtained from: Patient Chief Complaint Left lower extremity wound Electronic Signature(s) Signed: 06/14/2022 10:17:40 AM By: [name] Shan DO Entered By: [name] [name] on 06/14/2022 09:00:35 -------------------------------------------------------------------------------- Cellular or Tissue Based Product Details Patient Name: Date of Service: [name] [name] [name] 06/14/2022 8:15 A M Medical Record Number: [id number] Patient Account Number: [id number] Date of Birth/Sex: Treating RN: 18-Nov-1925 (88 y.o. [name] [name] Primary Care Provider: Marda [name] Other Clinician: Referring Provider: Treating Provider/Extender: [name] [name] in Treatment: 28 Cellular or Tissue Based Product Type Wound #26 Left,Lateral Ankle Applied to: Performed By: Physician [name] Shan, DO Cellular or Tissue Based Product Type: Other Level of Consciousness (Pre-procedure): Awake and Alert Pre-procedure Verification/Time Out Yes - 08:45 Taken: Location: trunk / arms / legs Wound Size (sq cm): 1.44 Product Size (sq cm): 8 Waste Size (sq cm): 2 Waste Reason: wound size Amount of Product Applied (sq cm): 6 Instrument Used: Forceps, Scissors Lot #: [id number] Order #: 2 Expiration Date: 01/26/2025 Fenestrated: No Reconstituted: Yes Solution Type: normal saline Solution Amount: 64m Lot #: [id number] Expiration Date: 08/22/2022 Secured: Yes Secured With: Steri-Strips, adaptic Dressing  Applied: No Procedural Pain: 0 Post Procedural Pain: 0 Response to Treatment: Procedure was tolerated well Level of Consciousness (Post- Awake and Alert procedure): Post Procedure Diagnosis Same as Pre-procedure Electronic Signature(s) Signed: 06/14/2022 10:17:40 AM By: HKalman ShanDO Signed: 06/14/2022 4:56:39 PM By: DDeon PillingRN, BSN Entered By: DDeon Pillingon 06/14/2022 08:49:38 -------------------------------------------------------------------------------- Debridement Details Patient Name: Date of Service: [name] [name] 06/14/2022 8:15 A M Medical Record Number: [id number] Account Number: [id number] of Birth/Sex: Treating RN: [birth year](88y.o. FHelene [name] BMeta.RedingPrimary Care Provider: WMarda StalkerOther Clinician: Referring Provider: Treating Provider/Extender: HAviva Signsin Treatment: 28 Debridement Performed for Assessment: Wound #26 Left,Lateral Ankle Performed By: Physician HKalman Shan DO Debridement Type: Debridement Severity of Tissue Pre Debridement: Fat layer exposed Level of Consciousness (Pre-procedure): Awake and Alert Pre-procedure Verification/Time Out Yes - 08:40 Taken: Start Time: 08:41 Pain Control: Lidocaine 5% topical ointment T Area Debrided (L x W): otal 2 (cm) x 1.2 (cm) = 2.4 (cm) Tissue and other material debrided: Viable, Non-Viable, Slough, Subcutaneous, Skin: Dermis , Skin: Epidermis, Slough Level: Skin/Subcutaneous Tissue Debridement Description: Excisional Instrument: Curette Bleeding: Minimum Hemostasis Achieved: Pressure End Time: 08:43 Procedural Pain: 0 Post Procedural Pain: 0 Response to Treatment: Procedure was tolerated well Level of Consciousness (Post- Awake and Alert procedure): Post Debridement Measurements of Total Wound Length: (cm) 1.6 Width: (cm) 0.9 Depth: (cm) 0.1 Volume: (cm) 0.113 Character of Wound/Ulcer Post Debridement: Improved Severity of  Tissue Post Debridement: Fat layer exposed Post Procedure Diagnosis Same as Pre-procedure Electronic Signature(s) Signed: 06/14/2022 10:17:40 AM By: HKalman ShanDO Signed: 06/14/2022 4:56:39 PM By: DDeon PillingRN, BSN Entered By: DDeon Pillingon 06/14/2022 08:44:15 -------------------------------------------------------------------------------- HPI Details Patient Name: Date of Service: HELLIANAH, [name] 06/14/2022 8:15 A M Medical Record Number: [id number] Account Number: [id number] of Birth/Sex: Treating RN: 908-20-1926(88y.o. FDebby BudPrimary Care Provider: WMarda StalkerOther Clinician: Referring Provider: Treating Provider/Extender: HMarjo BickerWeeks in Treatment: 28 History of  Present Illness HPI Description: this is a patient we know from several prior wounds on her bilateral lower extremities. She has venous stasis physiology, inflammation and hypertension. She is been fully evaluated for venous ablation and was found not to be a candidate. She does not have significant arterial disease. 02/14/15 the wound itself on her lateral left leg did not look too bad however there was surrounding maceration which was concerning 02/27/15 the wound itself is small with surrounding circumferential epithelialization there is no surrounding maceration. 03/06/15 only a very small area remains. I think this is mostly epithelialized however I would be uncomfortable not dressing this this week 03/13/15 the area is epithelialized. There was a thick surface on this. I took a #15 blade then lightly disrupted it but there does not appear to be any open area I did not see anything but appropriate epithelium Readmission: 08/16/18 on evaluation today patient presents for initial evaluation and our clinic released readmission although she has not been seen since April 2016. Nonetheless she is having issues with the ulcers currently on the bilateral lateral  malleolus or locations as well as the left lateral lower extremity. These have been present for roughly 3 weeks since the patient actually developed significant bilateral lower extremity edema which patient's daughter states was roughly 3 times the size of her legs currently. Subsequently she ended up in the emerging department due to congestive heart failure. They were able to get the swelling under control and fortunately she seems to be doing much better at this point in time. She was placed in an [employer] which she sat on for week part evaluation today as well. Fortunately the wound areas actually seem to be doing fairly well there is some macerated skin surrounding there are the areas/surface of the wound which are gonna require some debridement at this point. No fevers, chills, nausea, or vomiting noted at this time. Patient has a history of hypertension, mild peripheral vascular disease, chronic venous stasis, and other than the ulcer seems to be doing fairly well today. No fevers, chills, nausea, or vomiting noted at this time. 08/23/18 on evaluation today patient actually appears to be doing great in regard to her bilateral lower should be ulcers. She's shown [name] of improvement even just with one week with the [employer]. In general I'm actually very pleased with how things appear at this point. 09/06/18 on evaluation today patient actually appears to be doing much better her left lower extremity is completely healed. Go to the right lower extremity lateral malleolus ulcer this still seems to be showing [name] of being open. There does not appear to be evidence of infection which is good news. This did require some sharp debridement however to remove some of the necrotic tissue/eschar on the surface of the wound Readmission 11/27/2021 Ms. Annaston Upham is a 88 year old female with a past medical history of endometrial and breast cancer, chronic venous insufficiency, dementia  and essential hypertension that presents to the clinic with a 1 month history of nonhealing ulcer to the left lower extremity. She states that the ulcer opened 1 month ago and then healed but has reopened again in the past week. She is currently keeping the area covered. She has a history of wounds to her lower extremities. She has compression stockings that she uses daily. She currently denies [name] of infection. 1/13; patient presents for follow-up. She followed up for nurse visit for wrap change. She reports more odor. She denies pain. 1/20;  patient presents for follow-up. She has been using gentamicin ointment with calcium alginate to the wound bed. She reports improvement in odor and drainage. She currently denies systemic [name] of infection. 1/27; patient presents for follow-up. She has been using gentamicin ointment with calcium alginate. She received her Keystone antibiotic 2 days ago. She brought this in. Home health has also been established. They will come out for the first time next week. She currently denies [name] of infection. She has no issues or complaints today. 2/2; patient presents for follow-up. She has been using Keystone antibiotics with [facility] with no issues. She denies [name] of infection. She has no issues or complaints today. 2/9; patient presents for follow-up. She has been using Keystone antibiotics with [facility] with no issues. She has tolerated the compression wrap well. She denies [name] of infection. 2/16; patient presents for follow-up. She has been using Keystone antibiotics with Hydrofera Blue under compression wrap with no issues. She currently denies [name] of infection. 2/23; patient presents for follow-up. She continues to use Keystone antibiotics with Hydrofera Blue under the compression wrap. She has no issues or complaints today. She denies [name] of infection. Home health continues to come out and change the wrap. 3/2; patient presents for  follow-up. She continues to use [facility] with [facility] under compression with no issues. She denies [name] of infection. 3/16; patient presents for follow-up. She continues to use [facility] with [facility] under compression with no issues. Home health change the dressing last week. She denies [name] of infection. 3/23; patient presents for follow-up. She continues to use Keystone antibiotics with Hydrofera Blue under compression with no issues. Home health continues to change the dressings. She denies [name] of infection. 3/30; patient presents for follow-up. She continues to use Keystone antibiotics with Hydrofera Blue under compression with no issues. Home health continues to change the dressings. 4/13 2-week follow-up. Keystone and Hydrofera Blue wound is roughly 2.3 cm in diameter smaller. No other issues 4/20; patient presents for follow-up. She continues to use Keystone antibiotic with [facility] with no issues. 4/28; patient with a difficult wound on her right medial lower leg. Using [facility] Blue minimal improvement per our intake nurse we are using compression. She has home health changing the dressing 5/5; patient presents for follow-up. We have been using Keystone antibiotics and Hydrofera Blue under compression therapy. She has no issues or complaints today. We discussed potentially using an advanced tissue product. She states she has had this done in the past and would like to proceed to see what her insurance would cover. 5/15; patient presents for follow-up. She has been using Keystone antibiotics and Hydrofera Blue under compression therapy. She forgot her Keystone antibiotics today. Insurance has not approved her for skin substitute. She currently denies [name] of infection. She has home health that changes the dressing once a week. 5/19; patient presents for follow-up. We have been using Keystone antibiotic and Hydrofera Blue under compression therapy. She  has no issues or complaints today. The [employer] rep confirmed over the phone that the patient has been 100% approved for their skin substitute. We will verify this as we have not received any paperwork stating this. Patient states she would like to proceed with a skin substitute if it is covered by insurance. 6/1; patient presents for follow-up. We have been using Keystone antibiotic and Hydrofera Blue under compression therapy. We confirmed that she does have a co-pay for the [employer] skin substitute. We will hold off on  using this for now since she continues to show improvement with current therapy. She denies [name] of infection. She has home health that comes out twice weekly. 6/16; patient presents for follow-up. We have been using Keystone antibiotic and Hydrofera Blue under compression therapy. She has no issues or complaints today. We discussed the potential trial of vendaje. This is placenta tissue. She states she would like to try the free trial. We will reach out to our rep. 6/30; patient presents for follow-up. She has been using Keystone antibiotic and Hydrofera Blue under compression therapy. She has been approved for a trial of vendaje. She would like to proceed with this and We will have this available at next clinic visit. She has no issues or complaints today. 7/18; patient presents for follow-up. She has been using Keystone antibiotic and Hydrofera Blue under compression. She has no issues or complaints today. We have a free sample of vendaje skin substitute and patient is agreeable to having this placed today. 7/24; patient presents for follow-up. We placed the first free trial of vendaje last week under compression wrap. Patient had no issues or complaints today. She tolerated this well. Electronic Signature(s) Signed: 06/14/2022 10:17:40 AM By: [name] Shan DO Entered By: [name] [name] on 06/14/2022  09:01:13 -------------------------------------------------------------------------------- Physical Exam Details Patient Name: Date of Service: [name], VANDERHOOF [name] 06/14/2022 8:15 A M Medical Record Number: [id number] Patient Account Number: [id number] Date of Birth/Sex: Treating RN: 19-Jun-1925 (88 y.o. [name] [name] Primary Care Provider: Marda [name] Other Clinician: Referring Provider: Treating Provider/Extender: [name] [name] Weeks in Treatment: 28 Constitutional respirations regular, non-labored and within target range for patient.. Cardiovascular 2+ dorsalis pedis/posterior tibialis pulses. Psychiatric pleasant and cooperative. Notes Left lower extremity: T the lateral aspect there is an open wound with granulation tissue and scant nonviable tissue present. No surrounding [name] of infection. o Electronic Signature(s) Signed: 06/14/2022 10:17:40 AM By: [name] Shan DO Entered By: [name] [name] on 06/14/2022 09:01:47 -------------------------------------------------------------------------------- Physician Orders Details Patient Name: Date of Service: [name], DANIELSKI [name] 06/14/2022 8:15 A M Medical Record Number: [id number] Patient Account Number: [id number] Date of Birth/Sex: Treating RN: Feb 17, 1925 (88 y.o. [name] [name], Meta.Reding Primary Care Provider: Marda [name] Other Clinician: Referring Provider: Treating Provider/Extender: [name] [name] in Treatment: 71 Verbal / Phone Orders: No Diagnosis Coding ICD-10 Coding Code Description I87.312 Chronic venous hypertension (idiopathic) with ulcer of left lower extremity L97.822 Non-pressure chronic ulcer of other part of left lower leg with fat layer exposed C50.919 Malignant neoplasm of unspecified site of unspecified female breast I50.42 Chronic combined systolic (congestive) and diastolic (congestive) heart failure G30.9 Alzheimer's disease, unspecified C54.1  Malignant neoplasm of endometrium Follow-up Appointments ppointment in 1 week. - 06/22/22 @ 0945 w/ Dr. Heber [place] and [facility] Room # 8 Return A ppointment in 2 weeks. - Dr. Heber [place] and [facility] Room 9 06/29/2022 0915 Return A Cellular or Tissue Based Products Cellular or Tissue Based Product Type: - Vendaje #2 applied 06/14/2022 Cellular or Tissue Based Product applied to wound bed, secured with steri-strips, cover with Adaptic or Mepitel. (DO NOT REMOVE). Bathing/ Shower/ Hygiene May shower with protection but do not get wound dressing(s) wet. - use cast protector. May shower and wash wound with soap and water. - with dressing changes may wash with soap and water. Edema Control - Lymphedema / SCD / Other Elevate legs to the level of the heart or above for 30 minutes daily and/or when sitting, a frequency of: Avoid standing for long periods of time. Patient  to wear own compression stockings every day. - apply in the morning and remove at night to right leg. Exercise regularly Moisturize legs daily. - every night before day right leg. Additional Orders / Instructions Follow Nutritious Diet - High Protein Diet Home Health New wound care orders this week; continue Home Health for wound care. May utilize formulary equivalent dressing for wound treatment orders unless otherwise specified. - Home Health for skilled wound care nursing once a week, wound care once a week ***SKIN SUB PLACED; DO NOT REMOVE; DO NOT GET WET ONLY TAKE OFF OUTER DRESSING AND RE-WRAP**** ; NO ZINC OXIDE!! Other Home Health Orders/Instructions: [facility] home health Wound Treatment Wound #26 - Ankle Wound Laterality: Left, Lateral Cleanser: Soap and Water (Home Health) 2 x Per Week/30 Days Discharge Instructions: May shower and wash wound with dial antibacterial soap and water prior to dressing change. Peri-Wound Care: Sween Lotion (Moisturizing lotion) (Home Health) 2 x Per Week/30 Days Discharge Instructions: Apply  moisturizing lotion as directed Prim Dressing: VENDAJE SKIN SUB, ADAPTIC, STERI STRIPS ary 2 x Per Week/30 Days Secondary Dressing: Zetuvit Plus 4x8 in (Home Health) 2 x Per Week/30 Days Discharge Instructions: Apply over primary dressing as directed. Compression Wrap: ThreePress (3 layer compression wrap) (Home Health) 2 x Per Week/30 Days Discharge Instructions: Apply three layer compression as directed. Electronic Signature(s) Signed: 06/14/2022 10:17:40 AM By: [name] Shan DO Entered By: [name] [name] on 06/14/2022 09:01:56 -------------------------------------------------------------------------------- Problem List Details Patient Name: Date of Service: ANEESAH, HERNAN [name] 06/14/2022 8:15 A M Medical Record Number: [id number] Patient Account Number: [id number] Date of Birth/Sex: Treating RN: 04/02/1925 (88 y.o. [name] [name], Meta.Reding Primary Care Provider: Marda [name] Other Clinician: Referring Provider: Treating Provider/Extender: [name] [name] in Treatment: 28 Active Problems ICD-10 Encounter Code Description Active Date MDM Diagnosis I87.312 Chronic venous hypertension (idiopathic) with ulcer of left lower extremity 11/27/2021 No Yes L97.822 Non-pressure chronic ulcer of other part of left lower leg with fat layer exposed1/04/2022 No Yes C50.919 Malignant neoplasm of unspecified site of unspecified female breast 11/27/2021 No Yes I50.42 Chronic combined systolic (congestive) and diastolic (congestive) heart failure 11/27/2021 No Yes G30.9 Alzheimer's disease, unspecified 11/27/2021 No Yes C54.1 Malignant neoplasm of endometrium 11/27/2021 No Yes Inactive Problems Resolved Problems Electronic Signature(s) Signed: 06/14/2022 10:17:40 AM By: [name] Shan DO Entered By: [name] [name] on 06/14/2022 09:00:21 -------------------------------------------------------------------------------- Progress Note Details Patient Name: Date of Service: [name], GARMON [name] 06/14/2022 8:15 A M Medical Record Number: [id number] Patient Account Number: [id number] Date of Birth/Sex: Treating RN: [birth year] (88 y.o. [name] [name] Primary Care Provider: Marda [name] Other Clinician: Referring Provider: Treating Provider/Extender: [name] [name] in Treatment: 28 Subjective Chief Complaint Information obtained from Patient Left lower extremity wound History of Present Illness (HPI) this is a patient we know from several prior wounds on her bilateral lower extremities. She has venous stasis physiology, inflammation and hypertension. She is been fully evaluated for venous ablation and was found not to be a candidate. She does not have significant arterial disease. 02/14/15 the wound itself on her lateral left leg did not look too bad however there was surrounding maceration which was concerning 02/27/15 the wound itself is small with surrounding circumferential epithelialization there is no surrounding maceration. 03/06/15 only a very small area remains. I think this is mostly epithelialized however I would be uncomfortable not dressing this this week 03/13/15 the area is epithelialized. There was a thick surface on this. I took a #15 blade then lightly disrupted  it but there does not appear to be any open area I did not see anything but appropriate epithelium Readmission: 08/16/18 on evaluation today patient presents for initial evaluation and our clinic released readmission although she has not been seen since April 2016. Nonetheless she is having issues with the ulcers currently on the bilateral lateral malleolus or locations as well as the left lateral lower extremity. These have been present for roughly 3 weeks since the patient actually developed significant bilateral lower extremity edema which patient's daughter states was roughly 3 times the size of her legs currently. Subsequently she ended up in the emerging department due  to congestive heart failure. They were able to get the swelling under control and fortunately she seems to be doing much better at this point in time. She was placed in an [employer] which she sat on for week part evaluation today as well. Fortunately the wound areas actually seem to be doing fairly well there is some macerated skin surrounding there are the areas/surface of the wound which are gonna require some debridement at this point. No fevers, chills, nausea, or vomiting noted at this time. Patient has a history of hypertension, mild peripheral vascular disease, chronic venous stasis, and other than the ulcer seems to be doing fairly well today. No fevers, chills, nausea, or vomiting noted at this time. 08/23/18 on evaluation today patient actually appears to be doing great in regard to her bilateral lower should be ulcers. She's shown [name] of improvement even just with one week with the [employer]. In general I'm actually very pleased with how things appear at this point. 09/06/18 on evaluation today patient actually appears to be doing much better her left lower extremity is completely healed. Go to the right lower extremity lateral malleolus ulcer this still seems to be showing [name] of being open. There does not appear to be evidence of infection which is good news. This did require some sharp debridement however to remove some of the necrotic tissue/eschar on the surface of the wound Readmission 11/27/2021 Ms. [name] [name] is a 88 year old female with a past medical history of endometrial and breast cancer, chronic venous insufficiency, dementia and essential hypertension that presents to the clinic with a 1 month history of nonhealing ulcer to the left lower extremity. She states that the ulcer opened 1 month ago and then healed but has reopened again in the past week. She is currently keeping the area covered. She has a history of wounds to her lower extremities. She has  compression stockings that she uses daily. She currently denies [name] of infection. 1/13; patient presents for follow-up. She followed up for nurse visit for wrap change. She reports more odor. She denies pain. 1/20; patient presents for follow-up. She has been using gentamicin ointment with calcium alginate to the wound bed. She reports improvement in odor and drainage. She currently denies systemic [name] of infection. 1/27; patient presents for follow-up. She has been using gentamicin ointment with calcium alginate. She received her Keystone antibiotic 2 days ago. She brought this in. Home health has also been established. They will come out for the first time next week. She currently denies [name] of infection. She has no issues or complaints today. 2/2; patient presents for follow-up. She has been using Keystone antibiotics with [facility] with no issues. She denies [name] of infection. She has no issues or complaints today. 2/9; patient presents for follow-up. She has been using Keystone antibiotics with [facility] with  no issues. She has tolerated the compression wrap well. She denies [name] of infection. 2/16; patient presents for follow-up. She has been using Keystone antibiotics with Hydrofera Blue under compression wrap with no issues. She currently denies [name] of infection. 2/23; patient presents for follow-up. She continues to use Keystone antibiotics with Hydrofera Blue under the compression wrap. She has no issues or complaints today. She denies [name] of infection. Home health continues to come out and change the wrap. 3/2; patient presents for follow-up. She continues to use [facility] with [facility] under compression with no issues. She denies [name] of infection. 3/16; patient presents for follow-up. She continues to use [facility] with [facility] under compression with no issues. Home health change the dressing last week. She denies [name] of infection. 3/23; patient  presents for follow-up. She continues to use Keystone antibiotics with Hydrofera Blue under compression with no issues. Home health continues to change the dressings. She denies [name] of infection. 3/30; patient presents for follow-up. She continues to use Keystone antibiotics with Hydrofera Blue under compression with no issues. Home health continues to change the dressings. 4/13 2-week follow-up. Keystone and Hydrofera Blue wound is roughly 2.3 cm in diameter smaller. No other issues 4/20; patient presents for follow-up. She continues to use Keystone antibiotic with [facility] with no issues. 4/28; patient with a difficult wound on her right medial lower leg. Using [facility] Blue minimal improvement per our intake nurse we are using compression. She has home health changing the dressing 5/5; patient presents for follow-up. We have been using Keystone antibiotics and Hydrofera Blue under compression therapy. She has no issues or complaints today. We discussed potentially using an advanced tissue product. She states she has had this done in the past and would like to proceed to see what her insurance would cover. 5/15; patient presents for follow-up. She has been using Keystone antibiotics and Hydrofera Blue under compression therapy. She forgot her Keystone antibiotics today. Insurance has not approved her for skin substitute. She currently denies [name] of infection. She has home health that changes the dressing once a week. 5/19; patient presents for follow-up. We have been using Keystone antibiotic and Hydrofera Blue under compression therapy. She has no issues or complaints today. The [employer] rep confirmed over the phone that the patient has been 100% approved for their skin substitute. We will verify this as we have not received any paperwork stating this. Patient states she would like to proceed with a skin substitute if it is covered by insurance. 6/1; patient presents for  follow-up. We have been using Keystone antibiotic and Hydrofera Blue under compression therapy. We confirmed that she does have a co-pay for the [employer] skin substitute. We will hold off on using this for now since she continues to show improvement with current therapy. She denies [name] of infection. She has home health that comes out twice weekly. 6/16; patient presents for follow-up. We have been using Keystone antibiotic and Hydrofera Blue under compression therapy. She has no issues or complaints today. We discussed the potential trial of vendaje. This is placenta tissue. She states she would like to try the free trial. We will reach out to our rep. 6/30; patient presents for follow-up. She has been using Keystone antibiotic and Hydrofera Blue under compression therapy. She has been approved for a trial of vendaje. She would like to proceed with this and We will have this available at next clinic visit. She has no issues or complaints today. 7/18;  patient presents for follow-up. She has been using Keystone antibiotic and Hydrofera Blue under compression. She has no issues or complaints today. We have a free sample of vendaje skin substitute and patient is agreeable to having this placed today. 7/24; patient presents for follow-up. We placed the first free trial of vendaje last week under compression wrap. Patient had no issues or complaints today. She tolerated this well. Patient History Information obtained from Patient. Family History Cancer - Child, Hypertension - Child,Father, Kidney Disease - Child, Stroke - Siblings,Child, Thyroid Problems - Child, No family history of Diabetes, Heart Disease, Hereditary Spherocytosis, Lung Disease, Seizures, Tuberculosis. Social History Never smoker, Marital Status - Widowed, Alcohol Use - Never, Drug Use - No History, Caffeine Use - Never. Medical History Eyes Denies history of Cataracts, Glaucoma, Optic Neuritis Ear/Nose/Mouth/Throat Denies history  of Chronic sinus problems/congestion, Middle ear problems Hematologic/Lymphatic Patient has history of Anemia Denies history of Hemophilia, Human Immunodeficiency Virus, Lymphedema, Sickle Cell Disease Respiratory Denies history of Aspiration, Asthma, Chronic Obstructive Pulmonary Disease (COPD), Pneumothorax, Sleep Apnea, Tuberculosis Cardiovascular Patient has history of Congestive Heart Failure, Hypertension, Peripheral Venous Disease Denies history of Angina, Arrhythmia, Coronary Artery Disease, Hypotension, Myocardial Infarction, Peripheral Arterial Disease, Phlebitis, Vasculitis Gastrointestinal Denies history of Cirrhosis , Colitis, Crohnoos, Hepatitis A, Hepatitis B, Hepatitis C Endocrine Denies history of Type I Diabetes, Type II Diabetes Genitourinary Denies history of End Stage Renal Disease Immunological Denies history of Lupus Erythematosus, Raynaudoos, Scleroderma Integumentary (Skin) Denies history of History of Burn Musculoskeletal Patient has history of Osteoarthritis Denies history of Gout, Rheumatoid Arthritis, Osteomyelitis Neurologic Denies history of Neuropathy, Quadriplegia, Paraplegia, Seizure Disorder Oncologic Patient has history of Received Chemotherapy - Breast and Ovarian Cancer Denies history of Received Radiation Hospitalization/Surgery History - diverticulits. Objective Constitutional respirations regular, non-labored and within target range for patient.. Vitals Time Taken: 8:06 AM, Height: 66 in, Weight: 176 lbs, BMI: 28.4, Temperature: 98 F, Pulse: 55 bpm, Respiratory Rate: 18 breaths/min, Blood Pressure: 136/75 mmHg. Cardiovascular 2+ dorsalis pedis/posterior tibialis pulses. Psychiatric pleasant and cooperative. General Notes: Left lower extremity: T the lateral aspect there is an open wound with granulation tissue and scant nonviable tissue present. No surrounding o [name] of infection. Integumentary (Hair, Skin) Wound #26 status is  Open. Original cause of wound was Gradually Appeared. The date acquired was: 11/20/2021. The wound has been in treatment 28 weeks. The wound is located on the Left,Lateral Ankle. The wound measures 1.6cm length x 0.9cm width x 0.1cm depth; 1.131cm^2 area and 0.113cm^3 volume. There is Fat Layer (Subcutaneous Tissue) exposed. There is no tunneling or undermining noted. There is a medium amount of serosanguineous drainage noted. The wound margin is distinct with the outline attached to the wound base. There is large (67-100%) red granulation within the wound bed. There is a small (1-33%) amount of necrotic tissue within the wound bed including Adherent Slough. Assessment Active Problems ICD-10 Chronic venous hypertension (idiopathic) with ulcer of left lower extremity Non-pressure chronic ulcer of other part of left lower leg with fat layer exposed Malignant neoplasm of unspecified site of unspecified female breast Chronic combined systolic (congestive) and diastolic (congestive) heart failure Alzheimer's disease, unspecified Malignant neoplasm of endometrium Patient's wound has shown improvement in size and appearance since last clinic visit. I debrided nonviable tissue. Vandehey #2 was placed in standard fashion today. This is a free trial. We will continue with compression therapy. Follow-up in 1 week Procedures Wound #26 Pre-procedure diagnosis of Wound #26 is a Venous Leg Ulcer located on the  Left,Lateral Ankle .Severity of Tissue Pre Debridement is: Fat layer exposed. There was a Excisional Skin/Subcutaneous Tissue Debridement with a total area of 2.4 sq cm performed by [name] Shan, DO. With the following instrument(s): Curette to remove Viable and Non-Viable tissue/material. Material removed includes Subcutaneous Tissue, Slough, Skin: Dermis, and Skin: Epidermis after achieving pain control using Lidocaine 5% topical ointment. A time out was conducted at 08:40, prior to the start of  the procedure. A Minimum amount of bleeding was controlled with Pressure. The procedure was tolerated well with a pain level of 0 throughout and a pain level of 0 following the procedure. Post Debridement Measurements: 1.6cm length x 0.9cm width x 0.1cm depth; 0.113cm^3 volume. Character of Wound/Ulcer Post Debridement is improved. Severity of Tissue Post Debridement is: Fat layer exposed. Post procedure Diagnosis Wound #26: Same as Pre-Procedure Pre-procedure diagnosis of Wound #26 is a Venous Leg Ulcer located on the Left,Lateral Ankle. A skin graft procedure using a bioengineered skin substitute/cellular or tissue based product was performed by [name] Shan, DO with the following instrument(s): Forceps and Scissors. Other was applied and secured with Steri-Strips and adaptic. 6 sq cm of product was utilized and 2 sq cm was wasted due to wound size. Post Application, no dressing was applied. A Time Out was conducted at 08:45, prior to the start of the procedure. The procedure was tolerated well with a pain level of 0 throughout and a pain level of 0 following the procedure. Post procedure Diagnosis Wound #26: Same as Pre-Procedure . Pre-procedure diagnosis of Wound #26 is a Venous Leg Ulcer located on the Left,Lateral Ankle . There was a Three Layer Compression Therapy Procedure by Deon Pilling, RN. Post procedure Diagnosis Wound #26: Same as Pre-Procedure Plan Follow-up Appointments: Return Appointment in 1 week. - 06/22/22 @ 0945 w/ Dr. [facility] Room # 8 Return Appointment in 2 weeks. - Dr. [facility] [place] Room 9 06/29/2022 0915 Cellular or Tissue Based Products: Cellular or Tissue Based Product Type: - Vendaje #2 applied 06/14/2022 Cellular or Tissue Based Product applied to wound bed, secured with steri-strips, cover with Adaptic or Mepitel. (DO NOT REMOVE). Bathing/ Shower/ Hygiene: May shower with protection but do not get wound dressing(s) wet. - use cast protector. May  shower and wash wound with soap and water. - with dressing changes may wash with soap and water. Edema Control - Lymphedema / SCD / Other: Elevate legs to the level of the heart or above for 30 minutes daily and/or when sitting, a frequency of: Avoid standing for long periods of time. Patient to wear own compression stockings every day. - apply in the morning and remove at night to right leg. Exercise regularly Moisturize legs daily. - every night before day right leg. Additional Orders / Instructions: Follow Nutritious Diet - High Protein Diet Home Health: New wound care orders this week; continue Home Health for wound care. May utilize formulary equivalent dressing for wound treatment orders unless otherwise specified. - Home Health for skilled wound care nursing once a week, wound care once a week ***SKIN SUB PLACED; DO NOT REMOVE; DO NOT GET WET ONLY TAKE OFF OUTER DRESSING AND RE-WRAP**** NO ZINC OXIDE!! ; Other Home Health Orders/Instructions: - [facility] home [facility] WOUND #26: - Ankle Wound Laterality: Left, Lateral Cleanser: Soap and Water (Home Health) 2 x Per Week/30 Days Discharge Instructions: May shower and wash wound with dial antibacterial soap and water prior to dressing change. Peri-Wound Care: Sween Lotion (Moisturizing lotion) (Home Health) 2 x  Per Week/30 Days Discharge Instructions: Apply moisturizing lotion as directed Prim Dressing: VENDAJE SKIN SUB, ADAPTIC, STERI STRIPS 2 x Per Week/30 Days ary Secondary Dressing: Zetuvit Plus 4x8 in (Home Health) 2 x Per Week/30 Days Discharge Instructions: Apply over primary dressing as directed. Com pression Wrap: ThreePress (3 layer compression wrap) (Home Health) 2 x Per Week/30 Days Discharge Instructions: Apply three layer compression as directed. 1. Vandaje #2 placed in standard fashion today 2. In office sharp debridement 3. 3 layer compression 4. Follow-up in 1 week Electronic Signature(s) Signed: 06/14/2022 10:17:40 AM  By: [name] Shan DO Signed: 06/14/2022 10:17:40 AM By: [name] Shan DO Entered By: [name] [name] on 06/14/2022 09:02:45 -------------------------------------------------------------------------------- HxROS Details Patient Name: Date of Service: [name], ASMAN [name] 06/14/2022 8:15 A M Medical Record Number: [id number] Patient Account Number: [id number] Date of Birth/Sex: Treating RN: 1925/03/11 (88 y.o. [name] [name] Primary Care Provider: Marda [name] Other Clinician: Referring Provider: Treating Provider/Extender: [name] [name] in Treatment: 28 Information Obtained From Patient Eyes Medical History: Negative for: Cataracts; Glaucoma; Optic Neuritis Ear/Nose/Mouth/Throat Medical History: Negative for: Chronic sinus problems/congestion; Middle ear problems Hematologic/Lymphatic Medical History: Positive for: Anemia Negative for: Hemophilia; Human Immunodeficiency Virus; Lymphedema; Sickle Cell Disease Respiratory Medical History: Negative for: Aspiration; Asthma; Chronic Obstructive Pulmonary Disease (COPD); Pneumothorax; Sleep Apnea; Tuberculosis Cardiovascular Medical History: Positive for: Congestive Heart Failure; Hypertension; Peripheral Venous Disease Negative for: Angina; Arrhythmia; Coronary Artery Disease; Hypotension; Myocardial Infarction; Peripheral Arterial Disease; Phlebitis; Vasculitis Gastrointestinal Medical History: Negative for: Cirrhosis ; Colitis; Crohns; Hepatitis A; Hepatitis B; Hepatitis C Endocrine Medical History: Negative for: Type I Diabetes; Type II Diabetes Genitourinary Medical History: Negative for: End Stage Renal Disease Immunological Medical History: Negative for: Lupus Erythematosus; Raynauds; Scleroderma Integumentary (Skin) Medical History: Negative for: History of Burn Musculoskeletal Medical History: Positive for: Osteoarthritis Negative for: Gout; Rheumatoid Arthritis;  Osteomyelitis Neurologic Medical History: Negative for: Neuropathy; Quadriplegia; Paraplegia; Seizure Disorder Oncologic Medical History: Positive for: Received Chemotherapy - Breast and Ovarian Cancer Negative for: Received Radiation Immunizations Pneumococcal Vaccine: Received Pneumococcal Vaccination: Yes Received Pneumococcal Vaccination On or After 60th Birthday: Yes Implantable Devices None Hospitalization / Surgery History Type of Hospitalization/Surgery diverticulits Family and Social History Cancer: Yes - Child; Diabetes: No; Heart Disease: No; Hereditary Spherocytosis: No; Hypertension: Yes - Child,Father; Kidney Disease: Yes - Child; Lung Disease: No; Seizures: No; Stroke: Yes - Siblings,Child; Thyroid Problems: Yes - Child; Tuberculosis: No; Never smoker; Marital Status - Widowed; Alcohol Use: Never; Drug Use: No History; Caffeine Use: Never; Financial Concerns: No; Food, Clothing or Shelter Needs: No; Support System Lacking: No; Transportation Concerns: No Electronic Signature(s) Signed: 06/14/2022 10:17:40 AM By: [name] Shan DO Signed: 06/14/2022 4:56:39 PM By: Deon Pilling RN, BSN Entered By: [name] [name] on 06/14/2022 09:01:24 -------------------------------------------------------------------------------- SuperBill Details Patient Name: Date of Service: [name], GARROD [name] 06/14/2022 Medical Record Number: [id number] Patient Account Number: [id number] Date of Birth/Sex: Treating RN: 1925/04/09 (88 y.o. [name] [name], Meta.Reding Primary Care Provider: Marda [name] Other Clinician: Referring Provider: Treating Provider/Extender: [name] [name] Weeks in Treatment: 28 Diagnosis Coding ICD-10 Codes Code Description [id number] Chronic venous hypertension (idiopathic) with ulcer of left lower extremity L97.822 Non-pressure chronic ulcer of other part of left lower leg with fat layer exposed C50.919 Malignant neoplasm of unspecified site of  unspecified female breast I50.42 Chronic combined systolic (congestive) and diastolic (congestive) heart failure G30.9 Alzheimer's disease, unspecified C54.1 Malignant neoplasm of endometrium Facility Procedures Physician Procedures : CPT4 Code Description Modifier [id number] - WC PHYS SUBQ TISS 20 SQ  CM ICD-10 Diagnosis Description L97.822 Non-pressure chronic ulcer of other part of left lower leg with fat layer exposed Quantity: 1 Notes vendaje advance tissue prodcut donated. Electronic Signature(s) Signed: 06/14/2022 10:17:40 AM By: [name] Shan DO Entered By: [name] [name] on 06/14/2022 09:02:56

## 2022-06-22 ENCOUNTER — Encounter (HOSPITAL_BASED_OUTPATIENT_CLINIC_OR_DEPARTMENT_OTHER): Payer: Medicare PPO | Attending: Internal Medicine | Admitting: Internal Medicine

## 2022-06-22 DIAGNOSIS — G309 Alzheimer's disease, unspecified: Secondary | ICD-10-CM | POA: Diagnosis not present

## 2022-06-22 DIAGNOSIS — L97822 Non-pressure chronic ulcer of other part of left lower leg with fat layer exposed: Secondary | ICD-10-CM

## 2022-06-22 DIAGNOSIS — I739 Peripheral vascular disease, unspecified: Secondary | ICD-10-CM | POA: Diagnosis not present

## 2022-06-22 DIAGNOSIS — I110 Hypertensive heart disease with heart failure: Secondary | ICD-10-CM | POA: Insufficient documentation

## 2022-06-22 DIAGNOSIS — Z853 Personal history of malignant neoplasm of breast: Secondary | ICD-10-CM | POA: Insufficient documentation

## 2022-06-22 DIAGNOSIS — I87312 Chronic venous hypertension (idiopathic) with ulcer of left lower extremity: Secondary | ICD-10-CM | POA: Insufficient documentation

## 2022-06-22 DIAGNOSIS — I5042 Chronic combined systolic (congestive) and diastolic (congestive) heart failure: Secondary | ICD-10-CM | POA: Insufficient documentation

## 2022-06-22 NOTE — Progress Notes (Signed)
[name], [name] ([id number]) Visit Report for 06/22/2022 Chief Complaint Document Details Patient Name: Date of Service: TUYEN, UNCAPHER 06/22/2022 9:45 A M Medical Record Number: [id number] Patient Account Number: [id number] Date of Birth/Sex: Treating RN: 02/26/1925 (88 y.o. F) Primary Care Provider: Marda Stalker Other Clinician: Referring Provider: Treating Provider/Extender: Aviva Signs in Treatment: 29 Information Obtained from: Patient Chief Complaint Left lower extremity wound Electronic Signature(s) Signed: 06/22/2022 10:20:20 AM By: Kalman Shan DO Entered By: Kalman Shan on 06/22/2022 10:16:23 -------------------------------------------------------------------------------- Cellular or Tissue Based Product Details Patient Name: Date of Service: [name], [name] [name] 06/22/2022 9:45 A M Medical Record Number: [id number] Patient Account Number: [id number] Date of Birth/Sex: Treating RN: [birth year] (88 y.o. [name] Primary Care Provider: Marda Stalker Other Clinician: Referring Provider: Treating Provider/Extender: Aviva Signs in Treatment: 29 Cellular or Tissue Based Product Type Wound #26 Left,Lateral Ankle Applied to: Performed By: Physician Kalman Shan, DO Cellular or Tissue Based Product Type: Other Level of Consciousness (Pre-procedure): Awake and Alert Pre-procedure Verification/Time Out Yes - 10:07 Taken: Location: trunk / arms / legs Wound Size (sq cm): 1.26 Product Size (sq cm): 6 Waste Size (sq cm): 0 Amount of Product Applied (sq cm): 6 Instrument Used: Forceps, Scissors Lot #: [id number] Order #: 3 Expiration Date: 01/27/2025 Fenestrated: No Reconstituted: Yes Solution Type: normal saline Solution Amount: 63m Lot #: [id number] Expiration Date: 08/22/2022 Secured: Yes Secured With: Steri-Strips, adaptic Dressing Applied: No Procedural Pain: 0 Post  Procedural Pain: 0 Response to Treatment: Procedure was tolerated well Level of Consciousness (Post- Awake and Alert procedure): Post Procedure Diagnosis Same as Pre-procedure Electronic Signature(s) Signed: 06/22/2022 10:20:20 AM By: HKalman ShanDO Signed: 06/22/2022 4:41:40 PM By: DDeon PillingRN, BSN Entered By: DDeon Pillingon 06/22/2022 10:08:49 -------------------------------------------------------------------------------- Debridement Details Patient Name: Date of Service: [name], [name] 06/22/2022 9:45 A M Medical Record Number: [id number] Account Number: [id number] of Birth/Sex: Treating RN: 911-07-1925(88y.o. FHelene Shoe BMeta.RedingPrimary Care Provider: WMarda StalkerOther Clinician: Referring Provider: Treating Provider/Extender: HAviva Signsin Treatment: 29 Debridement Performed for Assessment: Wound #26 Left,Lateral Ankle Performed By: Physician HKalman Shan DO Debridement Type: Debridement Severity of Tissue Pre Debridement: Fat layer exposed Level of Consciousness (Pre-procedure): Awake and Alert Pre-procedure Verification/Time Out Yes - 10:00 Taken: Start Time: 10:01 Pain Control: Lidocaine 5% topical ointment T Area Debrided (L x W): otal 1.5 (cm) x 1 (cm) = 1.5 (cm) Tissue and other material debrided: Viable, Non-Viable, Slough, Subcutaneous, Skin: Dermis , Skin: Epidermis, Slough Level: Skin/Subcutaneous Tissue Debridement Description: Excisional Instrument: Curette Bleeding: Minimum Hemostasis Achieved: Pressure End Time: 10:06 Procedural Pain: 0 Post Procedural Pain: 0 Response to Treatment: Procedure was tolerated well Level of Consciousness (Post- Awake and Alert procedure): Post Debridement Measurements of Total Wound Length: (cm) 1.4 Width: (cm) 0.9 Depth: (cm) 0.1 Volume: (cm) 0.099 Character of Wound/Ulcer Post Debridement: Improved Severity of Tissue Post Debridement: Fat layer  exposed Post Procedure Diagnosis Same as Pre-procedure Electronic Signature(s) Signed: 06/22/2022 10:20:20 AM By: HKalman ShanDO Signed: 06/22/2022 4:41:40 PM By: DDeon PillingRN, BSN Entered By: DDeon Pillingon 06/22/2022 10:07:12 -------------------------------------------------------------------------------- HPI Details Patient Name: Date of Service: [name], [name] 06/22/2022 9:45 A M Medical Record Number: [id number] Account Number: [id number] of Birth/Sex: Treating RN: 9June 25, 1926(88y.o. F) Primary Care Provider: WMarda StalkerOther Clinician: Referring Provider: Treating Provider/Extender: HAviva Signsin Treatment: 29 History of Present Illness HPI Description: this is a patient  we know from several prior wounds on her bilateral lower extremities. She has venous stasis physiology, inflammation and hypertension. She is been fully evaluated for venous ablation and was found not to be a candidate. She does not have significant arterial disease. 02/14/15 the wound itself on her lateral left leg did not look too bad however there was surrounding maceration which was concerning 02/27/15 the wound itself is small with surrounding circumferential epithelialization there is no surrounding maceration. 03/06/15 only a very small area remains. I think this is mostly epithelialized however I would be uncomfortable not dressing this this week 03/13/15 the area is epithelialized. There was a thick surface on this. I took a #15 blade then lightly disrupted it but there does not appear to be any open area I did not see anything but appropriate epithelium Readmission: 08/16/18 on evaluation today patient presents for initial evaluation and our clinic released readmission although she has not been seen since April 2016. Nonetheless she is having issues with the ulcers currently on the bilateral lateral malleolus or locations as well as the left lateral  lower extremity. These have been present for roughly 3 weeks since the patient actually developed significant bilateral lower extremity edema which patient's daughter states was roughly 3 times the size of her legs currently. Subsequently she ended up in the emerging department due to congestive heart failure. They were able to get the swelling under control and fortunately she seems to be doing much better at this point in time. She was placed in an [employer] which she sat on for week part evaluation today as well. Fortunately the wound areas actually seem to be doing fairly well there is some macerated skin surrounding there are the areas/surface of the wound which are gonna require some debridement at this point. No fevers, chills, nausea, or vomiting noted at this time. Patient has a history of hypertension, mild peripheral vascular disease, chronic venous stasis, and other than the ulcer seems to be doing fairly well today. No fevers, chills, nausea, or vomiting noted at this time. 08/23/18 on evaluation today patient actually appears to be doing great in regard to her bilateral lower should be ulcers. She's shown signs of improvement even just with one week with the [employer]. In general I'm actually very pleased with how things appear at this point. 09/06/18 on evaluation today patient actually appears to be doing much better her left lower extremity is completely healed. Go to the right lower extremity lateral malleolus ulcer this still seems to be showing signs of being open. There does not appear to be evidence of infection which is good news. This did require some sharp debridement however to remove some of the necrotic tissue/eschar on the surface of the wound Readmission 11/27/2021 Ms. Sumedha Munnerlyn is a 88 year old female with a past medical history of endometrial and breast cancer, chronic venous insufficiency, dementia and essential hypertension that presents to the clinic  with a 1 month history of nonhealing ulcer to the left lower extremity. She states that the ulcer opened 1 month ago and then healed but has reopened again in the past week. She is currently keeping the area covered. She has a history of wounds to her lower extremities. She has compression stockings that she uses daily. She currently denies signs of infection. 1/13; patient presents for follow-up. She followed up for nurse visit for wrap change. She reports more odor. She denies pain. 1/20; patient presents for follow-up. She has been using  gentamicin ointment with calcium alginate to the wound bed. She reports improvement in odor and drainage. She currently denies systemic signs of infection. 1/27; patient presents for follow-up. She has been using gentamicin ointment with calcium alginate. She received her Keystone antibiotic 2 days ago. She brought this in. Home health has also been established. They will come out for the first time next week. She currently denies signs of infection. She has no issues or complaints today. 2/2; patient presents for follow-up. She has been using Keystone antibiotics with [facility] with no issues. She denies signs of infection. She has no issues or complaints today. 2/9; patient presents for follow-up. She has been using Keystone antibiotics with [facility] with no issues. She has tolerated the compression wrap well. She denies signs of infection. 2/16; patient presents for follow-up. She has been using Keystone antibiotics with Hydrofera Blue under compression wrap with no issues. She currently denies signs of infection. 2/23; patient presents for follow-up. She continues to use Keystone antibiotics with Hydrofera Blue under the compression wrap. She has no issues or complaints today. She denies signs of infection. Home health continues to come out and change the wrap. 3/2; patient presents for follow-up. She continues to use [facility] with [facility]  under compression with no issues. She denies signs of infection. 3/16; patient presents for follow-up. She continues to use [facility] with [facility] under compression with no issues. Home health change the dressing last week. She denies signs of infection. 3/23; patient presents for follow-up. She continues to use Keystone antibiotics with Hydrofera Blue under compression with no issues. Home health continues to change the dressings. She denies signs of infection. 3/30; patient presents for follow-up. She continues to use Keystone antibiotics with Hydrofera Blue under compression with no issues. Home health continues to change the dressings. 4/13 2-week follow-up. Keystone and Hydrofera Blue wound is roughly 2.3 cm in diameter smaller. No other issues 4/20; patient presents for follow-up. She continues to use Keystone antibiotic with [facility] with no issues. 4/28; patient with a difficult wound on her right medial lower leg. Using [facility] Blue minimal improvement per our intake nurse we are using compression. She has home health changing the dressing 5/5; patient presents for follow-up. We have been using Keystone antibiotics and Hydrofera Blue under compression therapy. She has no issues or complaints today. We discussed potentially using an advanced tissue product. She states she has had this done in the past and would like to proceed to see what her insurance would cover. 5/15; patient presents for follow-up. She has been using Keystone antibiotics and Hydrofera Blue under compression therapy. She forgot her Keystone antibiotics today. Insurance has not approved her for skin substitute. She currently denies signs of infection. She has home health that changes the dressing once a week. 5/19; patient presents for follow-up. We have been using Keystone antibiotic and Hydrofera Blue under compression therapy. She has no issues or complaints today. The [employer] rep confirmed  over the phone that the patient has been 100% approved for their skin substitute. We will verify this as we have not received any paperwork stating this. Patient states she would like to proceed with a skin substitute if it is covered by insurance. 6/1; patient presents for follow-up. We have been using Keystone antibiotic and Hydrofera Blue under compression therapy. We confirmed that she does have a co-pay for the [employer] skin substitute. We will hold off on using this for now since she continues to  show improvement with current therapy. She denies signs of infection. She has home health that comes out twice weekly. 6/16; patient presents for follow-up. We have been using Keystone antibiotic and Hydrofera Blue under compression therapy. She has no issues or complaints today. We discussed the potential trial of vendaje. This is placenta tissue. She states she would like to try the free trial. We will reach out to our rep. 6/30; patient presents for follow-up. She has been using Keystone antibiotic and Hydrofera Blue under compression therapy. She has been approved for a trial of vendaje. She would like to proceed with this and We will have this available at next clinic visit. She has no issues or complaints today. 7/18; patient presents for follow-up. She has been using Keystone antibiotic and Hydrofera Blue under compression. She has no issues or complaints today. We have a free sample of vendaje skin substitute and patient is agreeable to having this placed today. 7/24; patient presents for follow-up. We placed the first free trial of vendaje last week under compression wrap. Patient had no issues or complaints today. She tolerated this well. 8/1; patient presents for follow-up. Vandaje #2 donated product was placed in standard fashion last week under compression therapy. She has no issues or complaints today. Electronic Signature(s) Signed: 06/22/2022 10:20:20 AM By: Kalman Shan DO Entered  By: Kalman Shan on 06/22/2022 10:17:12 -------------------------------------------------------------------------------- Physical Exam Details Patient Name: Date of Service: [name], [name] 06/22/2022 9:45 A M Medical Record Number: [id number] Patient Account Number: [id number] Date of Birth/Sex: Treating RN: 08-15-1925 (88 y.o. F) Primary Care Provider: Marda Stalker Other Clinician: Referring Provider: Treating Provider/Extender: Aviva Signs in Treatment: 74 Constitutional respirations regular, non-labored and within target range for patient.. Cardiovascular 2+ dorsalis pedis/posterior tibialis pulses. Psychiatric pleasant and cooperative. Notes Left lower extremity: T the lateral aspect there is an open wound with granulation tissue and nonviable tissue present. No surrounding signs of infection. o Electronic Signature(s) Signed: 06/22/2022 10:20:20 AM By: Kalman Shan DO Entered By: Kalman Shan on 06/22/2022 10:17:52 -------------------------------------------------------------------------------- Physician Orders Details Patient Name: Date of Service: [name], [name] 06/22/2022 9:45 A M Medical Record Number: [id number] Patient Account Number: [id number] Date of Birth/Sex: Treating RN: Nov 08, 1925 (88 y.o. [name], Meta.Reding Primary Care Provider: Marda Stalker Other Clinician: Referring Provider: Treating Provider/Extender: Aviva Signs in Treatment: 74 Verbal / Phone Orders: No Diagnosis Coding ICD-10 Coding Code Description I87.312 Chronic venous hypertension (idiopathic) with ulcer of left lower extremity L97.822 Non-pressure chronic ulcer of other part of left lower leg with fat layer exposed C50.919 Malignant neoplasm of unspecified site of unspecified female breast I50.42 Chronic combined systolic (congestive) and diastolic (congestive) heart failure G30.9 Alzheimer's disease,  unspecified C54.1 Malignant neoplasm of endometrium Follow-up Appointments ppointment in 1 week. - Dr. Heber  and Edna Room 9 06/29/2022 0915 Return A Cellular or Tissue Based Products Cellular or Tissue Based Product Type: - Vendaje #2 applied 06/14/2022 Vendaje #3 applied 06/22/2022 Cellular or Tissue Based Product applied to wound bed, secured with steri-strips, cover with Adaptic or Mepitel. (DO NOT REMOVE). Bathing/ Shower/ Hygiene May shower with protection but do not get wound dressing(s) wet. - use cast protector. May shower and wash wound with soap and water. - with dressing changes may wash with soap and water. Edema Control - Lymphedema / SCD / Other Elevate legs to the level of the heart or above for 30 minutes daily and/or when sitting, a frequency of: Avoid standing for long periods of time.  Patient to wear own compression stockings every day. - apply in the morning and remove at night to right leg. Exercise regularly Moisturize legs daily. - every night before day right leg. Additional Orders / Instructions Follow Nutritious Diet - High Protein Diet Home Health No change in wound care orders this week; continue Home Health for wound care. May utilize formulary equivalent dressing for wound treatment orders unless otherwise specified. - Home Health for skilled wound care nursing once a week, wound care once a week ***SKIN SUB PLACED; DO NOT REMOVE; DO NOT GET WET ONLY TAKE OFF OUTER DRESSING AND RE-WRAP**** ; NO ZINC OXIDE!! Other Home Health Orders/Instructions: [facility] home health Wound Treatment Wound #26 - Ankle Wound Laterality: Left, Lateral Cleanser: Soap and Water (Home Health) 2 x Per Week/30 Days Discharge Instructions: May shower and wash wound with dial antibacterial soap and water prior to dressing change. Peri-Wound Care: Sween Lotion (Moisturizing lotion) (Home Health) 2 x Per Week/30 Days Discharge Instructions: Apply moisturizing lotion as  directed Prim Dressing: VENDAJE SKIN SUB, ADAPTIC, STERI STRIPS 2 x Per Week/30 Days ary Discharge Instructions: applied directly to wound bed by provider. Secondary Dressing: Zetuvit Plus 4x8 in [facility]) 2 x Per Week/30 Days Discharge Instructions: Apply over primary dressing as directed. Compression Wrap: ThreePress (3 layer compression wrap) (Home Health) 2 x Per Week/30 Days Discharge Instructions: Apply three layer compression as directed. Electronic Signature(s) Signed: 06/22/2022 10:20:20 AM By: Kalman Shan DO Entered By: Kalman Shan on 06/22/2022 10:18:03 -------------------------------------------------------------------------------- Problem List Details Patient Name: Date of Service: KERENSA, NICKLAS [name] 06/22/2022 9:45 A M Medical Record Number: [id number] Patient Account Number: [id number] Date of Birth/Sex: Treating RN: 01-08-1925 (88 y.o. [name], Meta.Reding Primary Care Provider: Marda Stalker Other Clinician: Referring Provider: Treating Provider/Extender: Aviva Signs in Treatment: 29 Active Problems ICD-10 Encounter Code Description Active Date MDM Diagnosis I87.312 Chronic venous hypertension (idiopathic) with ulcer of left lower extremity 11/27/2021 No Yes L97.822 Non-pressure chronic ulcer of other part of left lower leg with fat layer exposed1/04/2022 No Yes C50.919 Malignant neoplasm of unspecified site of unspecified female breast 11/27/2021 No Yes I50.42 Chronic combined systolic (congestive) and diastolic (congestive) heart failure 11/27/2021 No Yes G30.9 Alzheimer's disease, unspecified 11/27/2021 No Yes C54.1 Malignant neoplasm of endometrium 11/27/2021 No Yes Inactive Problems Resolved Problems Electronic Signature(s) Signed: 06/22/2022 10:20:20 AM By: Kalman Shan DO Entered By: Kalman Shan on 06/22/2022 10:15:54 -------------------------------------------------------------------------------- Progress Note  Details Patient Name: Date of Service: EVALYSE, STROOPE [name] 06/22/2022 9:45 A M Medical Record Number: [id number] Patient Account Number: [id number] Date of Birth/Sex: Treating RN: 1925/06/10 (88 y.o. F) Primary Care Provider: Marda Stalker Other Clinician: Referring Provider: Treating Provider/Extender: Aviva Signs in Treatment: 29 Subjective Chief Complaint Information obtained from Patient Left lower extremity wound History of Present Illness (HPI) this is a patient we know from several prior wounds on her bilateral lower extremities. She has venous stasis physiology, inflammation and hypertension. She is been fully evaluated for venous ablation and was found not to be a candidate. She does not have significant arterial disease. 02/14/15 the wound itself on her lateral left leg did not look too bad however there was surrounding maceration which was concerning 02/27/15 the wound itself is small with surrounding circumferential epithelialization there is no surrounding maceration. 03/06/15 only a very small area remains. I think this is mostly epithelialized however I would be uncomfortable not dressing this this week 03/13/15 the area is epithelialized. There was a thick surface  on this. I took a #15 blade then lightly disrupted it but there does not appear to be any open area I did not see anything but appropriate epithelium Readmission: 08/16/18 on evaluation today patient presents for initial evaluation and our clinic released readmission although she has not been seen since April 2016. Nonetheless she is having issues with the ulcers currently on the bilateral lateral malleolus or locations as well as the left lateral lower extremity. These have been present for roughly 3 weeks since the patient actually developed significant bilateral lower extremity edema which patient's daughter states was roughly 3 times the size of her legs currently. Subsequently she ended  up in the emerging department due to congestive heart failure. They were able to get the swelling under control and fortunately she seems to be doing much better at this point in time. She was placed in an [employer] which she sat on for week part evaluation today as well. Fortunately the wound areas actually seem to be doing fairly well there is some macerated skin surrounding there are the areas/surface of the wound which are gonna require some debridement at this point. No fevers, chills, nausea, or vomiting noted at this time. Patient has a history of hypertension, mild peripheral vascular disease, chronic venous stasis, and other than the ulcer seems to be doing fairly well today. No fevers, chills, nausea, or vomiting noted at this time. 08/23/18 on evaluation today patient actually appears to be doing great in regard to her bilateral lower should be ulcers. She's shown signs of improvement even just with one week with the [employer]. In general I'm actually very pleased with how things appear at this point. 09/06/18 on evaluation today patient actually appears to be doing much better her left lower extremity is completely healed. Go to the right lower extremity lateral malleolus ulcer this still seems to be showing signs of being open. There does not appear to be evidence of infection which is good news. This did require some sharp debridement however to remove some of the necrotic tissue/eschar on the surface of the wound Readmission 11/27/2021 Ms. Francina Beery is a 88 year old female with a past medical history of endometrial and breast cancer, chronic venous insufficiency, dementia and essential hypertension that presents to the clinic with a 1 month history of nonhealing ulcer to the left lower extremity. She states that the ulcer opened 1 month ago and then healed but has reopened again in the past week. She is currently keeping the area covered. She has a history of wounds to  her lower extremities. She has compression stockings that she uses daily. She currently denies signs of infection. 1/13; patient presents for follow-up. She followed up for nurse visit for wrap change. She reports more odor. She denies pain. 1/20; patient presents for follow-up. She has been using gentamicin ointment with calcium alginate to the wound bed. She reports improvement in odor and drainage. She currently denies systemic signs of infection. 1/27; patient presents for follow-up. She has been using gentamicin ointment with calcium alginate. She received her Keystone antibiotic 2 days ago. She brought this in. Home health has also been established. They will come out for the first time next week. She currently denies signs of infection. She has no issues or complaints today. 2/2; patient presents for follow-up. She has been using Keystone antibiotics with [facility] with no issues. She denies signs of infection. She has no issues or complaints today. 2/9; patient presents for follow-up.  She has been using Keystone antibiotics with [facility] with no issues. She has tolerated the compression wrap well. She denies signs of infection. 2/16; patient presents for follow-up. She has been using Keystone antibiotics with Hydrofera Blue under compression wrap with no issues. She currently denies signs of infection. 2/23; patient presents for follow-up. She continues to use Keystone antibiotics with Hydrofera Blue under the compression wrap. She has no issues or complaints today. She denies signs of infection. Home health continues to come out and change the wrap. 3/2; patient presents for follow-up. She continues to use [facility] with [facility] under compression with no issues. She denies signs of infection. 3/16; patient presents for follow-up. She continues to use [facility] with [facility] under compression with no issues. Home health change the dressing last week. She denies signs  of infection. 3/23; patient presents for follow-up. She continues to use Keystone antibiotics with Hydrofera Blue under compression with no issues. Home health continues to change the dressings. She denies signs of infection. 3/30; patient presents for follow-up. She continues to use Keystone antibiotics with Hydrofera Blue under compression with no issues. Home health continues to change the dressings. 4/13 2-week follow-up. Keystone and Hydrofera Blue wound is roughly 2.3 cm in diameter smaller. No other issues 4/20; patient presents for follow-up. She continues to use Keystone antibiotic with [employer] with no issues. 4/28; patient with a difficult wound on her right medial lower leg. Using [facility] Blue minimal improvement per our intake nurse we are using compression. She has home health changing the dressing 5/5; patient presents for follow-up. We have been using Keystone antibiotics and Hydrofera Blue under compression therapy. She has no issues or complaints today. We discussed potentially using an advanced tissue product. She states she has had this done in the past and would like to proceed to see what her insurance would cover. 5/15; patient presents for follow-up. She has been using Keystone antibiotics and Hydrofera Blue under compression therapy. She forgot her Keystone antibiotics today. Insurance has not approved her for skin substitute. She currently denies signs of infection. She has home health that changes the dressing once a week. 5/19; patient presents for follow-up. We have been using Keystone antibiotic and Hydrofera Blue under compression therapy. She has no issues or complaints today. The [employer] rep confirmed over the phone that the patient has been 100% approved for their skin substitute. We will verify this as we have not received any paperwork stating this. Patient states she would like to proceed with a skin substitute if it is covered by  insurance. 6/1; patient presents for follow-up. We have been using Keystone antibiotic and Hydrofera Blue under compression therapy. We confirmed that she does have a co-pay for the [employer] skin substitute. We will hold off on using this for now since she continues to show improvement with current therapy. She denies signs of infection. She has home health that comes out twice weekly. 6/16; patient presents for follow-up. We have been using Keystone antibiotic and Hydrofera Blue under compression therapy. She has no issues or complaints today. We discussed the potential trial of vendaje. This is placenta tissue. She states she would like to try the free trial. We will reach out to our rep. 6/30; patient presents for follow-up. She has been using Keystone antibiotic and Hydrofera Blue under compression therapy. She has been approved for a trial of vendaje. She would like to proceed with this and We will have this available at next  clinic visit. She has no issues or complaints today. 7/18; patient presents for follow-up. She has been using Keystone antibiotic and Hydrofera Blue under compression. She has no issues or complaints today. We have a free sample of vendaje skin substitute and patient is agreeable to having this placed today. 7/24; patient presents for follow-up. We placed the first free trial of vendaje last week under compression wrap. Patient had no issues or complaints today. She tolerated this well. 8/1; patient presents for follow-up. Vandaje #2 donated product was placed in standard fashion last week under compression therapy. She has no issues or complaints today. Patient History Information obtained from Patient. Family History Cancer - Child, Hypertension - Child,Father, Kidney Disease - Child, Stroke - Siblings,Child, Thyroid Problems - Child, No family history of Diabetes, Heart Disease, Hereditary Spherocytosis, Lung Disease, Seizures, Tuberculosis. Social History Never  smoker, Marital Status - Widowed, Alcohol Use - Never, Drug Use - No History, Caffeine Use - Never. Medical History Eyes Denies history of Cataracts, Glaucoma, Optic Neuritis Ear/Nose/Mouth/Throat Denies history of Chronic sinus problems/congestion, Middle ear problems Hematologic/Lymphatic Patient has history of Anemia Denies history of Hemophilia, Human Immunodeficiency Virus, Lymphedema, Sickle Cell Disease Respiratory Denies history of Aspiration, Asthma, Chronic Obstructive Pulmonary Disease (COPD), Pneumothorax, Sleep Apnea, Tuberculosis Cardiovascular Patient has history of Congestive Heart Failure, Hypertension, Peripheral Venous Disease Denies history of Angina, Arrhythmia, Coronary Artery Disease, Hypotension, Myocardial Infarction, Peripheral Arterial Disease, Phlebitis, Vasculitis Gastrointestinal Denies history of Cirrhosis , Colitis, Crohnoos, Hepatitis A, Hepatitis B, Hepatitis C Endocrine Denies history of Type I Diabetes, Type II Diabetes Genitourinary Denies history of End Stage Renal Disease Immunological Denies history of Lupus Erythematosus, Raynaudoos, Scleroderma Integumentary (Skin) Denies history of History of Burn Musculoskeletal Patient has history of Osteoarthritis Denies history of Gout, Rheumatoid Arthritis, Osteomyelitis Neurologic Denies history of Neuropathy, Quadriplegia, Paraplegia, Seizure Disorder Oncologic Patient has history of Received Chemotherapy - Breast and Ovarian Cancer Denies history of Received Radiation Hospitalization/Surgery History - diverticulits. Objective Constitutional respirations regular, non-labored and within target range for patient.. Vitals Time Taken: 9:45 AM, Height: 66 in, Weight: 176 lbs, BMI: 28.4, Temperature: 98.1 F, Pulse: 59 bpm, Respiratory Rate: 17 breaths/min, Blood Pressure: 139/69 mmHg. Cardiovascular 2+ dorsalis pedis/posterior tibialis pulses. Psychiatric pleasant and cooperative. General  Notes: Left lower extremity: T the lateral aspect there is an open wound with granulation tissue and nonviable tissue present. No surrounding signs of o infection. Integumentary (Hair, Skin) Wound #26 status is Open. Original cause of wound was Gradually Appeared. The date acquired was: 11/20/2021. The wound has been in treatment 29 weeks. The wound is located on the Left,Lateral Ankle. The wound measures 1.4cm length x 0.9cm width x 0.1cm depth; 0.99cm^2 area and 0.099cm^3 volume. There is Fat Layer (Subcutaneous Tissue) exposed. There is no tunneling or undermining noted. There is a medium amount of serosanguineous drainage noted. The wound margin is distinct with the outline attached to the wound base. There is large (67-100%) red granulation within the wound bed. There is a small (1-33%) amount of necrotic tissue within the wound bed including Adherent Slough. Assessment Active Problems ICD-10 Chronic venous hypertension (idiopathic) with ulcer of left lower extremity Non-pressure chronic ulcer of other part of left lower leg with fat layer exposed Malignant neoplasm of unspecified site of unspecified female breast Chronic combined systolic (congestive) and diastolic (congestive) heart failure Alzheimer's disease, unspecified Malignant neoplasm of endometrium Patient's wound has shown improvement in size and appearance since last clinic visit. I debrided nonviable tissue. Vandaje #3 donated product  was placed in standard fashion today. We will continue compression therapy. Procedures Wound #26 Pre-procedure diagnosis of Wound #26 is a Venous Leg Ulcer located on the Left,Lateral Ankle .Severity of Tissue Pre Debridement is: Fat layer exposed. There was a Excisional Skin/Subcutaneous Tissue Debridement with a total area of 1.5 sq cm performed by Kalman Shan, DO. With the following instrument(s): Curette to remove Viable and Non-Viable tissue/material. Material removed includes  Subcutaneous Tissue, Slough, Skin: Dermis, and Skin: Epidermis after achieving pain control using Lidocaine 5% topical ointment. A time out was conducted at 10:00, prior to the start of the procedure. A Minimum amount of bleeding was controlled with Pressure. The procedure was tolerated well with a pain level of 0 throughout and a pain level of 0 following the procedure. Post Debridement Measurements: 1.4cm length x 0.9cm width x 0.1cm depth; 0.099cm^3 volume. Character of Wound/Ulcer Post Debridement is improved. Severity of Tissue Post Debridement is: Fat layer exposed. Post procedure Diagnosis Wound #26: Same as Pre-Procedure Pre-procedure diagnosis of Wound #26 is a Venous Leg Ulcer located on the Left,Lateral Ankle. A skin graft procedure using a bioengineered skin substitute/cellular or tissue based product was performed by Kalman Shan, DO with the following instrument(s): Forceps and Scissors. Other was applied and secured with Steri-Strips and adaptic. 6 sq cm of product was utilized and 0 sq cm was wasted. Post Application, no dressing was applied. A Time Out was conducted at 10:07, prior to the start of the procedure. The procedure was tolerated well with a pain level of 0 throughout and a pain level of 0 following the procedure. Post procedure Diagnosis Wound #26: Same as Pre-Procedure . Pre-procedure diagnosis of Wound #26 is a Venous Leg Ulcer located on the Left,Lateral Ankle . There was a Three Layer Compression Therapy Procedure by Deon Pilling, RN. Post procedure Diagnosis Wound #26: Same as Pre-Procedure Plan Follow-up Appointments: Return Appointment in 1 week. - Dr. Heber Vanderbilt and [place] Room 9 06/29/2022 0915 Cellular or Tissue Based Products: Cellular or Tissue Based Product Type: - Vendaje #2 applied 06/14/2022 Vendaje #3 applied 06/22/2022 Cellular or Tissue Based Product applied to wound bed, secured with steri-strips, cover with Adaptic or Mepitel. (DO NOT  REMOVE). Bathing/ Shower/ Hygiene: May shower with protection but do not get wound dressing(s) wet. - use cast protector. May shower and wash wound with soap and water. - with dressing changes may wash with soap and water. Edema Control - Lymphedema / SCD / Other: Elevate legs to the level of the heart or above for 30 minutes daily and/or when sitting, a frequency of: Avoid standing for long periods of time. Patient to wear own compression stockings every day. - apply in the morning and remove at night to right leg. Exercise regularly Moisturize legs daily. - every night before day right leg. Additional Orders / Instructions: Follow Nutritious Diet - High Protein Diet Home Health: No change in wound care orders this week; continue Home Health for wound care. May utilize formulary equivalent dressing for wound treatment orders unless otherwise specified. - Home Health for skilled wound care nursing once a week, wound care once a week ***SKIN SUB PLACED; DO NOT REMOVE; DO NOT GET WET ONLY TAKE OFF OUTER DRESSING AND RE-WRAP**** NO ZINC OXIDE!! ; Other Home Health Orders/Instructions: - [facility] home health WOUND #26: - Ankle Wound Laterality: Left, Lateral Cleanser: Soap and Water (Home Health) 2 x Per Week/30 Days Discharge Instructions: May shower and wash wound with dial antibacterial soap and water prior to  dressing change. Peri-Wound Care: Sween Lotion (Moisturizing lotion) (Home Health) 2 x Per Week/30 Days Discharge Instructions: Apply moisturizing lotion as directed Prim Dressing: VENDAJE SKIN SUB, ADAPTIC, STERI STRIPS 2 x Per Week/30 Days ary Discharge Instructions: applied directly to wound bed by provider. Secondary Dressing: Zetuvit Plus 4x8 in [facility]) 2 x Per Week/30 Days Discharge Instructions: Apply over primary dressing as directed. Com pression Wrap: ThreePress (3 layer compression wrap) (Home Health) 2 x Per Week/30 Days Discharge Instructions: Apply three layer  compression as directed. 1. In office sharp debridement 2. Vandaje #3 placed in standard fashion, donated product 3. Continue 3 layer compression 4. Follow-up in 1 week Electronic Signature(s) Signed: 06/22/2022 10:20:20 AM By: Kalman Shan DO Entered By: Kalman Shan on 06/22/2022 10:19:23 -------------------------------------------------------------------------------- HxROS Details Patient Name: Date of Service: [name], [name] 06/22/2022 9:45 A M Medical Record Number: [id number] Patient Account Number: [id number] Date of Birth/Sex: Treating RN: [birth year] (88 y.o. F) Primary Care Provider: Marda Stalker Other Clinician: Referring Provider: Treating Provider/Extender: Aviva Signs in Treatment: 29 Information Obtained From Patient Eyes Medical History: Negative for: Cataracts; Glaucoma; Optic Neuritis Ear/Nose/Mouth/Throat Medical History: Negative for: Chronic sinus problems/congestion; Middle ear problems Hematologic/Lymphatic Medical History: Positive for: Anemia Negative for: Hemophilia; Human Immunodeficiency Virus; Lymphedema; Sickle Cell Disease Respiratory Medical History: Negative for: Aspiration; Asthma; Chronic Obstructive Pulmonary Disease (COPD); Pneumothorax; Sleep Apnea; Tuberculosis Cardiovascular Medical History: Positive for: Congestive Heart Failure; Hypertension; Peripheral Venous Disease Negative for: Angina; Arrhythmia; Coronary Artery Disease; Hypotension; Myocardial Infarction; Peripheral Arterial Disease; Phlebitis; Vasculitis Gastrointestinal Medical History: Negative for: Cirrhosis ; Colitis; Crohns; Hepatitis A; Hepatitis B; Hepatitis C Endocrine Medical History: Negative for: Type I Diabetes; Type II Diabetes Genitourinary Medical History: Negative for: End Stage Renal Disease Immunological Medical History: Negative for: Lupus Erythematosus; Raynauds; Scleroderma Integumentary (Skin) Medical  History: Negative for: History of Burn Musculoskeletal Medical History: Positive for: Osteoarthritis Negative for: Gout; Rheumatoid Arthritis; Osteomyelitis Neurologic Medical History: Negative for: Neuropathy; Quadriplegia; Paraplegia; Seizure Disorder Oncologic Medical History: Positive for: Received Chemotherapy - Breast and Ovarian Cancer Negative for: Received Radiation Immunizations Pneumococcal Vaccine: Received Pneumococcal Vaccination: Yes Received Pneumococcal Vaccination On or After 60th Birthday: Yes Implantable Devices None Hospitalization / Surgery History Type of Hospitalization/Surgery diverticulits Family and Social History Cancer: Yes - Child; Diabetes: No; Heart Disease: No; Hereditary Spherocytosis: No; Hypertension: Yes - Child,Father; Kidney Disease: Yes - Child; Lung Disease: No; Seizures: No; Stroke: Yes - Siblings,Child; Thyroid Problems: Yes - Child; Tuberculosis: No; Never smoker; Marital Status - Widowed; Alcohol Use: Never; Drug Use: No History; Caffeine Use: Never; Financial Concerns: No; Food, Clothing or Shelter Needs: No; Support System Lacking: No; Transportation Concerns: No Electronic Signature(s) Signed: 06/22/2022 10:20:20 AM By: Kalman Shan DO Entered By: Kalman Shan on 06/22/2022 10:17:19 -------------------------------------------------------------------------------- SuperBill Details Patient Name: Date of Service: [name], [name] 06/22/2022 Medical Record Number: [id number] Patient Account Number: [id number] Date of Birth/Sex: Treating RN: 19-Jul-1925 (88 y.o. [name], Meta.Reding Primary Care Provider: Marda Stalker Other Clinician: Referring Provider: Treating Provider/Extender: Aviva Signs in Treatment: 29 Diagnosis Coding ICD-10 Codes Code Description [phone] Chronic venous hypertension (idiopathic) with ulcer of left lower extremity L97.822 Non-pressure chronic ulcer of other part of  left lower leg with fat layer exposed C50.919 Malignant neoplasm of unspecified site of unspecified female breast I50.42 Chronic combined systolic (congestive) and diastolic (congestive) heart failure G30.9 Alzheimer's disease, unspecified C54.1 Malignant neoplasm of endometrium Facility Procedures CPT4 Code: [id number] Description: 11042 - DEB SUBQ TISSUE 20 SQ CM/<  ICD-10 Diagnosis Description L97.822 Non-pressure chronic ulcer of other part of left lower leg with fat layer exp Modifier: 1 osed Quantity: Physician Procedures : CPT4 Code Description Modifier [id number] 11042 - WC PHYS SUBQ TISS 20 SQ CM ICD-10 Diagnosis Description L97.822 Non-pressure chronic ulcer of other part of left lower leg with fat layer exposed Quantity: 1 Notes Vendaje donated product. Electronic Signature(s) Signed: 06/22/2022 11:48:44 AM By: Kalman Shan DO Signed: 06/22/2022 4:41:40 PM By: Deon Pilling RN, BSN Previous Signature: 06/22/2022 10:20:20 AM Version By: Kalman Shan DO Entered By: Deon Pilling on 06/22/2022 10:32:16

## 2022-06-22 NOTE — Progress Notes (Signed)
VERNELLA, NIZNIK ([id number]) Visit Report for 06/22/2022 Arrival Information Details Patient Name: Date of Service: [name], [name] 06/22/2022 9:45 A M Medical Record Number: [id number] Patient Account Number: [id number] Date of Birth/Sex: Treating RN: 25-May-1925 (88 y.o. F) Primary Care Ceazia Harb: Marda Stalker Other Clinician: Referring Rubin Dais: Treating Sharetha Newson/Extender: Aviva Signs in Treatment: 29 Visit Information History Since Last Visit Added or deleted any medications: No Patient Arrived: Wheel Chair Any new allergies or adverse reactions: No Arrival Time: 09:41 Had a fall or experienced change in No Accompanied By: daughter activities of daily living that may affect Transfer Assistance: None risk of falls: Patient Identification Verified: Yes Signs or symptoms of abuse/neglect since last visito No Secondary Verification Process Completed: Yes Hospitalized since last visit: No Patient Requires Transmission-Based Precautions: No Implantable device outside of the clinic excluding No Patient Has Alerts: No cellular tissue based products placed in the center since last visit: Has Dressing in Place as Prescribed: Yes Has Compression in Place as Prescribed: Yes Pain Present Now: No Electronic Signature(s) Signed: 06/22/2022 4:26:53 PM By: Erenest Blank Entered By: Erenest Blank on 06/22/2022 09:42:16 -------------------------------------------------------------------------------- Compression Therapy Details Patient Name: Date of Service: [name] 06/22/2022 9:45 A M Medical Record Number: [id number] Patient Account Number: [id number] Date of Birth/Sex: Treating RN: 22-Sep-1925 (88 y.o. [name] Primary Care Yissel Habermehl: Marda Stalker Other Clinician: Referring Danya Spearman: Treating Maquita Sandoval/Extender: Aviva Signs in Treatment: 29 Compression Therapy Performed for Wound Assessment: Wound #26  Left,Lateral Ankle Performed By: Clinician Deon Pilling, RN Compression Type: Three Layer Post Procedure Diagnosis Same as Pre-procedure Electronic Signature(s) Signed: 06/22/2022 4:41:40 PM By: Deon Pilling RN, BSN Entered By: Deon Pilling on 06/22/2022 10:09:02 -------------------------------------------------------------------------------- Encounter Discharge Information Details Patient Name: Date of Service: [name] 06/22/2022 9:45 A M Medical Record Number: [id number] Patient Account Number: [id number] Date of Birth/Sex: Treating RN: [birth year] (88 y.o. [name], Tammi Klippel Primary Care Kianni Lheureux: Marda Stalker Other Clinician: Referring Zenola Dezarn: Treating Lorijean Husser/Extender: Aviva Signs in Treatment: 29 Encounter Discharge Information Items Post Procedure Vitals Discharge Condition: Stable Temperature (F): 98.1 Ambulatory Status: Wheelchair Pulse (bpm): 59 Discharge Destination: Home Respiratory Rate (breaths/min): 20 Transportation: Private Auto Blood Pressure (mmHg): 139/69 Accompanied By: daughter Schedule Follow-up Appointment: Yes Clinical Summary of Care: Electronic Signature(s) Signed: 06/22/2022 4:41:40 PM By: Deon Pilling RN, BSN Entered By: Deon Pilling on 06/22/2022 10:11:29 -------------------------------------------------------------------------------- Lower Extremity Assessment Details Patient Name: Date of Service: [name] 06/22/2022 9:45 A M Medical Record Number: [id number] Patient Account Number: [id number] Date of Birth/Sex: Treating RN: 03/09/1925 (88 y.o. [name], Tammi Klippel Primary Care Fumi Guadron: Marda Stalker Other Clinician: Referring Crews Mccollam: Treating Velvet Moomaw/Extender: Marjo Bicker Weeks in Treatment: 29 Edema Assessment Assessed: [name]: Yes] Patrice Paradise: No] Edema: [Left: N] [Right: o] Calf Left: Right: Point of Measurement: 30 cm From Medial Instep 29.8  cm Ankle Left: Right: Point of Measurement: 11 cm From Medial Instep 20 cm Vascular Assessment Pulses: Dorsalis Pedis Palpable: [Left:Yes] Electronic Signature(s) Signed: 06/22/2022 4:41:40 PM By: Deon Pilling RN, BSN Entered By: Deon Pilling on 06/22/2022 09:47:30 -------------------------------------------------------------------------------- Multi Wound Chart Details Patient Name: Date of Service: [name] 06/22/2022 9:45 A M Medical Record Number: [id number] Patient Account Number: [id number] Date of Birth/Sex: Treating RN: 1925-04-17 (88 y.o. F) Primary Care Adelena Desantiago: Marda Stalker Other Clinician: Referring Eleazar Kimmey: Treating Rickita Forstner/Extender: Aviva Signs in Treatment: 29 Vital Signs Height(in): 66 Pulse(bpm): 59 Weight(lbs): 176 Blood Pressure(mmHg): 139/69 Body Mass Index(BMI): 28.4 Temperature(F): 98.1 Respiratory Rate(breaths/min):  108 Photos: [N/A:N/A] Left, Lateral Ankle N/A N/A Wound Location: Gradually Appeared N/A N/A Wounding Event: Venous Leg Ulcer N/A N/A Primary Etiology: Anemia, Congestive Heart Failure, N/A N/A Comorbid History: Hypertension, Peripheral Venous Disease, Osteoarthritis, Received Chemotherapy 11/20/2021 N/A N/A Date Acquired: 2 N/A N/A Weeks of Treatment: Open N/A N/A Wound Status: No N/A N/A Wound Recurrence: 1.4x0.9x0.1 N/A N/A Measurements L x W x D (cm) 0.99 N/A N/A A (cm) : rea 0.099 N/A N/A Volume (cm) : 89.50% N/A N/A % Reduction in A rea: 89.50% N/A N/A % Reduction in Volume: Full Thickness Without Exposed N/A N/A Classification: Support Structures Medium N/A N/A Exudate A mount: Serosanguineous N/A N/A Exudate Type: red, brown N/A N/A Exudate Color: Distinct, outline attached N/A N/A Wound Margin: Large (67-100%) N/A N/A Granulation A mount: Red N/A N/A Granulation Quality: Small (1-33%) N/A N/A Necrotic A mount: Fat Layer (Subcutaneous Tissue): Yes  N/A N/A Exposed Structures: Fascia: No Tendon: No Muscle: No Joint: No Bone: No Medium (34-66%) N/A N/A Epithelialization: Debridement - Excisional N/A N/A Debridement: Pre-procedure Verification/Time Out 10:00 N/A N/A Taken: Lidocaine 5% topical ointment N/A N/A Pain Control: Subcutaneous, Slough N/A N/A Tissue Debrided: Skin/Subcutaneous Tissue N/A N/A Level: 1.5 N/A N/A Debridement A (sq cm): rea Curette N/A N/A Instrument: Minimum N/A N/A Bleeding: Pressure N/A N/A Hemostasis A chieved: 0 N/A N/A Procedural Pain: 0 N/A N/A Post Procedural Pain: Procedure was tolerated well N/A N/A Debridement Treatment Response: 1.4x0.9x0.1 N/A N/A Post Debridement Measurements L x W x D (cm) 0.099 N/A N/A Post Debridement Volume: (cm) Cellular or Tissue Based Product N/A N/A Procedures Performed: Compression Therapy Debridement Treatment Notes Wound #26 (Ankle) Wound Laterality: Left, Lateral Cleanser Soap and Water Discharge Instruction: May shower and wash wound with dial antibacterial soap and water prior to dressing change. Peri-Wound Care Sween Lotion (Moisturizing lotion) Discharge Instruction: Apply moisturizing lotion as directed Topical Primary Dressing [place], [place], [place] Discharge Instruction: applied directly to wound bed by Jaiyon Wander. Secondary Dressing Zetuvit Plus 4x8 in Discharge Instruction: Apply over primary dressing as directed. Secured With Compression Wrap ThreePress (3 layer compression wrap) Discharge Instruction: Apply three layer compression as directed. Compression Stockings Add-Ons Electronic Signature(s) Signed: 06/22/2022 10:20:20 AM By: Kalman Shan DO Entered By: Kalman Shan on 06/22/2022 10:16:01 -------------------------------------------------------------------------------- Multi-Disciplinary Care Plan Details Patient Name: Date of Service: NORI, WINEGAR [name] 06/22/2022 9:45 A M Medical Record  Number: [id number] Patient Account Number: [id number] Date of Birth/Sex: Treating RN: [birth year] (88 y.o. [name], Tammi Klippel Primary Care Tyaisha Cullom: Marda Stalker Other Clinician: Referring Giliana Vantil: Treating Coltin Casher/Extender: Aviva Signs in Treatment: 29 Active Inactive Nutrition Nursing Diagnoses: Imbalanced nutrition Goals: Patient/caregiver agrees to and verbalizes understanding of need to use nutritional supplements and/or vitamins as prescribed Date Initiated: 11/27/2021 Target Resolution Date: 07/23/2022 Goal Status: Active Interventions: Assess patient nutrition upon admission and as needed per policy Provide education on nutrition Treatment Activities: Education provided on Nutrition : 05/07/2022 Notes: 06/22/2022 #3 vendaje advance tissue product applied by product, compression continued. Electronic Signature(s) Signed: 06/22/2022 4:41:40 PM By: Deon Pilling RN, BSN Entered By: Deon Pilling on 06/22/2022 09:57:00 -------------------------------------------------------------------------------- Pain Assessment Details Patient Name: Date of Service: KRYSTL, WICKWARE [name] 06/22/2022 9:45 A M Medical Record Number: [id number] Patient Account Number: [id number] Date of Birth/Sex: Treating RN: 1925-10-26 (88 y.o. F) Primary Care Trea Latner: Marda Stalker Other Clinician: Referring Cochise Dinneen: Treating Dillian Feig/Extender: Aviva Signs in Treatment: 29 Active Problems Location of Pain Severity and Description of Pain Patient Has Paino No  Site Locations Pain Management and Medication Current Pain Management: Electronic Signature(s) Signed: 06/22/2022 4:26:53 PM By: Erenest Blank Entered By: Erenest Blank on 06/22/2022 09:45:27 -------------------------------------------------------------------------------- Patient/Caregiver Education Details Patient Name: Date of Service: Thalia Party [name] 8/1/2023andnbsp9:45 A  M Medical Record Number: [id number] Patient Account Number: [id number] Date of Birth/Gender: Treating RN: July 15, 1925 (88 y.o. [name] Primary Care Physician: Marda Stalker Other Clinician: Referring Physician: Treating Physician/Extender: Aviva Signs in Treatment: 20 Education Assessment Education Provided To: Patient Education Topics Provided Nutrition: Handouts: Nutrition Methods: Explain/Verbal Responses: Reinforcements needed Electronic Signature(s) Signed: 06/22/2022 4:41:40 PM By: Deon Pilling RN, BSN Entered By: Deon Pilling on 06/22/2022 09:57:12 -------------------------------------------------------------------------------- Wound Assessment Details Patient Name: Date of Service: JOLLY, CARLINI [name] 06/22/2022 9:45 A M Medical Record Number: [id number] Patient Account Number: [id number] Date of Birth/Sex: Treating RN: 11-Dec-1924 (88 y.o. F) Primary Care Lera Gaines: Marda Stalker Other Clinician: Referring Circe Chilton: Treating Haadiya Frogge/Extender: Marjo Bicker Weeks in Treatment: 29 Wound Status Wound Number: 26 Primary Venous Leg Ulcer Etiology: Wound Location: Left, Lateral Ankle Wound Open Wounding Event: Gradually Appeared Status: Date Acquired: 11/20/2021 Comorbid Anemia, Congestive Heart Failure, Hypertension, Peripheral Weeks Of Treatment: 29 History: Venous Disease, Osteoarthritis, Received Chemotherapy Clustered Wound: No Photos Wound Measurements Length: (cm) 1.4 Width: (cm) 0.9 Depth: (cm) 0.1 Area: (cm) 0.99 Volume: (cm) 0.099 % Reduction in Area: 89.5% % Reduction in Volume: 89.5% Epithelialization: Medium (34-66%) Tunneling: No Undermining: No Wound Description Classification: Full Thickness Without Exposed Support Structures Wound Margin: Distinct, outline attached Exudate Amount: Medium Exudate Type: Serosanguineous Exudate Color: red, brown Foul Odor After Cleansing:  No Slough/Fibrino Yes Wound Bed Granulation Amount: Large (67-100%) Exposed Structure Granulation Quality: Red Fascia Exposed: No Necrotic Amount: Small (1-33%) Fat Layer (Subcutaneous Tissue) Exposed: Yes Necrotic Quality: Adherent Slough Tendon Exposed: No Muscle Exposed: No Joint Exposed: No Bone Exposed: No Treatment Notes Wound #26 (Ankle) Wound Laterality: Left, Lateral Cleanser Soap and Water Discharge Instruction: May shower and wash wound with dial antibacterial soap and water prior to dressing change. Peri-Wound Care Sween Lotion (Moisturizing lotion) Discharge Instruction: Apply moisturizing lotion as directed Topical Primary Dressing [place], [place], [place] Discharge Instruction: applied directly to wound bed by Donavon Kimrey. Secondary Dressing Zetuvit Plus 4x8 in Discharge Instruction: Apply over primary dressing as directed. Secured With Compression Wrap ThreePress (3 layer compression wrap) Discharge Instruction: Apply three layer compression as directed. Compression Stockings Add-Ons Electronic Signature(s) Signed: 06/22/2022 4:26:53 PM By: Erenest Blank Entered By: Erenest Blank on 06/22/2022 09:50:34 -------------------------------------------------------------------------------- Vitals Details Patient Name: Date of Service: ESTIE, SPROULE [name] 06/22/2022 9:45 A M Medical Record Number: [id number] Patient Account Number: [id number] Date of Birth/Sex: Treating RN: 1924/12/19 (88 y.o. F) Primary Care Arrion Broaddus: Marda Stalker Other Clinician: Referring Damyn Weitzel: Treating Kahliya Fraleigh/Extender: Aviva Signs in Treatment: 29 Vital Signs Time Taken: 09:45 Temperature (F): 98.1 Height (in): 66 Pulse (bpm): 59 Weight (lbs): 176 Respiratory Rate (breaths/min): 17 Body Mass Index (BMI): 28.4 Blood Pressure (mmHg): 139/69 Reference Range: 80 - 120 mg / dl Electronic Signature(s) Signed: 06/22/2022 4:26:53 PM By:  Erenest Blank Entered By: Erenest Blank on 06/22/2022 09:45:22

## 2022-06-29 ENCOUNTER — Encounter (HOSPITAL_BASED_OUTPATIENT_CLINIC_OR_DEPARTMENT_OTHER): Payer: Medicare PPO | Admitting: Internal Medicine

## 2022-06-29 DIAGNOSIS — L97822 Non-pressure chronic ulcer of other part of left lower leg with fat layer exposed: Secondary | ICD-10-CM

## 2022-06-29 DIAGNOSIS — Z853 Personal history of malignant neoplasm of breast: Secondary | ICD-10-CM | POA: Diagnosis not present

## 2022-06-29 DIAGNOSIS — I5042 Chronic combined systolic (congestive) and diastolic (congestive) heart failure: Secondary | ICD-10-CM | POA: Diagnosis not present

## 2022-06-29 DIAGNOSIS — I87312 Chronic venous hypertension (idiopathic) with ulcer of left lower extremity: Secondary | ICD-10-CM | POA: Diagnosis not present

## 2022-06-29 DIAGNOSIS — I110 Hypertensive heart disease with heart failure: Secondary | ICD-10-CM | POA: Diagnosis not present

## 2022-06-29 DIAGNOSIS — I739 Peripheral vascular disease, unspecified: Secondary | ICD-10-CM | POA: Diagnosis not present

## 2022-06-29 DIAGNOSIS — G309 Alzheimer's disease, unspecified: Secondary | ICD-10-CM | POA: Diagnosis not present

## 2022-06-29 NOTE — Progress Notes (Signed)
[name], [name] ([id number]) Visit Report for 06/29/2022 Chief Complaint Document Details Patient Name: Date of Service: [name], [name] 06/29/2022 9:15 A M Medical Record Number: [id number] Patient Account Number: [id number] Date of Birth/Sex: Treating RN: September 02, 1925 (88 y.o. F) Primary Care Provider: Marda Stalker Other Clinician: Referring Provider: Treating Provider/Extender: Aviva Signs in Treatment: 30 Information Obtained from: Patient Chief Complaint Left lower extremity wound Electronic Signature(s) Signed: 06/29/2022 10:16:18 AM By: Kalman Shan DO Entered By: Kalman Shan on 06/29/2022 10:13:34 -------------------------------------------------------------------------------- Cellular or Tissue Based Product Details Patient Name: Date of Service: [name], [name] 06/29/2022 9:15 A M Medical Record Number: [id number] Patient Account Number: [id number] Date of Birth/Sex: Treating RN: 1925/08/29 (88 y.o. F) Primary Care Provider: Marda Stalker Other Clinician: Referring Provider: Treating Provider/Extender: Aviva Signs in Treatment: 30 Cellular or Tissue Based Product Type Wound #26 Left,Lateral Ankle Applied to: Performed By: Physician Kalman Shan, DO Cellular or Tissue Based Product Type: Other Level of Consciousness (Pre-procedure): Awake and Alert Pre-procedure Verification/Time Out Yes - 10:00 Taken: Location: trunk / arms / legs Wound Size (sq cm): 0.5 Product Size (sq cm): 8 Waste Size (sq cm): 0 Amount of Product Applied (sq cm): 8 Instrument Used: Forceps Lot #: [id number] Expiration Date: 01/27/2025 Fenestrated: No Reconstituted: No Secured: Yes Secured With: Steri-Strips Dressing Applied: Yes Primary Dressing: Adaptic Procedural Pain: 0 Post Procedural Pain: 0 Response to Treatment: Procedure was tolerated well Level of Consciousness (Post- Awake and  Alert procedure): Post Procedure Diagnosis Same as Pre-procedure Electronic Signature(s) Signed: 06/29/2022 10:16:18 AM By: Kalman Shan DO Signed: 06/29/2022 12:26:25 PM By: Erenest Blank Entered By: Erenest Blank on 06/29/2022 10:04:53 -------------------------------------------------------------------------------- Debridement Details Patient Name: Date of Service: [name], [name] 06/29/2022 9:15 A M Medical Record Number: [id number] Patient Account Number: [id number] Date of Birth/Sex: Treating RN: 1924-12-18 (88 y.o. F) Primary Care Provider: Marda Stalker Other Clinician: Referring Provider: Treating Provider/Extender: Aviva Signs in Treatment: 30 Debridement Performed for Assessment: Wound #26 Left,Lateral Ankle Performed By: Physician Kalman Shan, DO Debridement Type: Debridement Severity of Tissue Pre Debridement: Fat layer exposed Level of Consciousness (Pre-procedure): Awake and Alert Pre-procedure Verification/Time Out Yes - 09:57 Taken: Start Time: 09:57 Pain Control: Lidocaine T Area Debrided (L x W): otal 1 (cm) x 0.5 (cm) = 0.5 (cm) Tissue and other material debrided: Viable, Non-Viable, Slough, Subcutaneous, Slough Level: Skin/Subcutaneous Tissue Debridement Description: Excisional Instrument: Curette Bleeding: Minimum Hemostasis Achieved: Pressure End Time: 09:57 Procedural Pain: 0 Post Procedural Pain: 0 Response to Treatment: Procedure was tolerated well Level of Consciousness (Post- Awake and Alert procedure): Post Debridement Measurements of Total Wound Length: (cm) 1 Width: (cm) 0.5 Depth: (cm) 0.1 Volume: (cm) 0.039 Character of Wound/Ulcer Post Debridement: Improved Severity of Tissue Post Debridement: Fat layer exposed Post Procedure Diagnosis Same as Pre-procedure Electronic Signature(s) Signed: 06/29/2022 10:16:18 AM By: Kalman Shan DO Signed: 06/29/2022 12:26:25 PM By: Erenest Blank Entered By: Erenest Blank on 06/29/2022 09:59:43 -------------------------------------------------------------------------------- HPI Details Patient Name: Date of Service: [name], [name] 06/29/2022 9:15 A M Medical Record Number: [id number] Patient Account Number: [id number] Date of Birth/Sex: Treating RN: 02/04/1925 (88 y.o. F) Primary Care Provider: Other Clinician: Marda Stalker Referring Provider: Treating Provider/Extender: Aviva Signs in Treatment: 30 History of Present Illness HPI Description: this is a patient we know from several prior wounds on her bilateral lower extremities. She has venous stasis physiology, inflammation and hypertension. She is been fully evaluated for venous ablation and was found not to  be a candidate. She does not have significant arterial disease. 02/14/15 the wound itself on her lateral left leg did not look too bad however there was surrounding maceration which was concerning 02/27/15 the wound itself is small with surrounding circumferential epithelialization there is no surrounding maceration. 03/06/15 only a very small area remains. I think this is mostly epithelialized however I would be uncomfortable not dressing this this week 03/13/15 the area is epithelialized. There was a thick surface on this. I took a #15 blade then lightly disrupted it but there does not appear to be any open area I did not see anything but appropriate epithelium Readmission: 08/16/18 on evaluation today patient presents for initial evaluation and our clinic released readmission although she has not been seen since April 2016. Nonetheless she is having issues with the ulcers currently on the bilateral lateral malleolus or locations as well as the left lateral lower extremity. These have been present for roughly 3 weeks since the patient actually developed significant bilateral lower extremity edema which patient's daughter states was  roughly 3 times the size of her legs currently. Subsequently she ended up in the emerging department due to congestive heart failure. They were able to get the swelling under control and fortunately she seems to be doing much better at this point in time. She was placed in an [employer] which she sat on for week part evaluation today as well. Fortunately the wound areas actually seem to be doing fairly well there is some macerated skin surrounding there are the areas/surface of the wound which are gonna require some debridement at this point. No fevers, chills, nausea, or vomiting noted at this time. Patient has a history of hypertension, mild peripheral vascular disease, chronic venous stasis, and other than the ulcer seems to be doing fairly well today. No fevers, chills, nausea, or vomiting noted at this time. 08/23/18 on evaluation today patient actually appears to be doing great in regard to her bilateral lower should be ulcers. She's shown signs of improvement even just with one week with the [employer]. In general I'm actually very pleased with how things appear at this point. 09/06/18 on evaluation today patient actually appears to be doing much better her left lower extremity is completely healed. Go to the right lower extremity lateral malleolus ulcer this still seems to be showing signs of being open. There does not appear to be evidence of infection which is good news. This did require some sharp debridement however to remove some of the necrotic tissue/eschar on the surface of the wound Readmission 11/27/2021 Ms. Kemyra August is a 88 year old female with a past medical history of endometrial and breast cancer, chronic venous insufficiency, dementia and essential hypertension that presents to the clinic with a 1 month history of nonhealing ulcer to the left lower extremity. She states that the ulcer opened 1 month ago and then healed but has reopened again in the past week. She  is currently keeping the area covered. She has a history of wounds to her lower extremities. She has compression stockings that she uses daily. She currently denies signs of infection. 1/13; patient presents for follow-up. She followed up for nurse visit for wrap change. She reports more odor. She denies pain. 1/20; patient presents for follow-up. She has been using gentamicin ointment with calcium alginate to the wound bed. She reports improvement in odor and drainage. She currently denies systemic signs of infection. 1/27; patient presents for follow-up. She has been using  gentamicin ointment with calcium alginate. She received her Keystone antibiotic 2 days ago. She brought this in. Home health has also been established. They will come out for the first time next week. She currently denies signs of infection. She has no issues or complaints today. 2/2; patient presents for follow-up. She has been using Keystone antibiotics with [facility] with no issues. She denies signs of infection. She has no issues or complaints today. 2/9; patient presents for follow-up. She has been using Keystone antibiotics with [facility] with no issues. She has tolerated the compression wrap well. She denies signs of infection. 2/16; patient presents for follow-up. She has been using Keystone antibiotics with Hydrofera Blue under compression wrap with no issues. She currently denies signs of infection. 2/23; patient presents for follow-up. She continues to use Keystone antibiotics with Hydrofera Blue under the compression wrap. She has no issues or complaints today. She denies signs of infection. Home health continues to come out and change the wrap. 3/2; patient presents for follow-up. She continues to use [facility] with [facility] under compression with no issues. She denies signs of infection. 3/16; patient presents for follow-up. She continues to use [facility] with [facility] under compression with no  issues. Home health change the dressing last week. She denies signs of infection. 3/23; patient presents for follow-up. She continues to use Keystone antibiotics with Hydrofera Blue under compression with no issues. Home health continues to change the dressings. She denies signs of infection. 3/30; patient presents for follow-up. She continues to use Keystone antibiotics with Hydrofera Blue under compression with no issues. Home health continues to change the dressings. 4/13 2-week follow-up. Keystone and Hydrofera Blue wound is roughly 2.3 cm in diameter smaller. No other issues 4/20; patient presents for follow-up. She continues to use Keystone antibiotic with [facility] with no issues. 4/28; patient with a difficult wound on her right medial lower leg. Using [facility] Blue minimal improvement per our intake nurse we are using compression. She has home health changing the dressing 5/5; patient presents for follow-up. We have been using Keystone antibiotics and Hydrofera Blue under compression therapy. She has no issues or complaints today. We discussed potentially using an advanced tissue product. She states she has had this done in the past and would like to proceed to see what her insurance would cover. 5/15; patient presents for follow-up. She has been using Keystone antibiotics and Hydrofera Blue under compression therapy. She forgot her Keystone antibiotics today. Insurance has not approved her for skin substitute. She currently denies signs of infection. She has home health that changes the dressing once a week. 5/19; patient presents for follow-up. We have been using Keystone antibiotic and Hydrofera Blue under compression therapy. She has no issues or complaints today. The [employer] rep confirmed over the phone that the patient has been 100% approved for their skin substitute. We will verify this as we have not received any paperwork stating this. Patient states she would  like to proceed with a skin substitute if it is covered by insurance. 6/1; patient presents for follow-up. We have been using Keystone antibiotic and Hydrofera Blue under compression therapy. We confirmed that she does have a co-pay for the [employer] skin substitute. We will hold off on using this for now since she continues to show improvement with current therapy. She denies signs of infection. She has home health that comes out twice weekly. 6/16; patient presents for follow-up. We have been using Keystone antibiotic and Hydrofera  Blue under compression therapy. She has no issues or complaints today. We discussed the potential trial of vendaje. This is placenta tissue. She states she would like to try the free trial. We will reach out to our rep. 6/30; patient presents for follow-up. She has been using Keystone antibiotic and Hydrofera Blue under compression therapy. She has been approved for a trial of vendaje. She would like to proceed with this and We will have this available at next clinic visit. She has no issues or complaints today. 7/18; patient presents for follow-up. She has been using Keystone antibiotic and Hydrofera Blue under compression. She has no issues or complaints today. We have a free sample of vendaje skin substitute and patient is agreeable to having this placed today. 7/24; patient presents for follow-up. We placed the first free trial of vendaje last week under compression wrap. Patient had no issues or complaints today. She tolerated this well. 8/1; patient presents for follow-up. Vandaje #2 donated product was placed in standard fashion last week under compression therapy. She has no issues or complaints today. 8/8; patient presents for follow-up. Vendaje #3 donated skin set was placed in standard fashion last visit under compression therapy. She has no issues or complaints today. Electronic Signature(s) Signed: 06/29/2022 10:16:18 AM By: Kalman Shan DO Entered By:  Kalman Shan on 06/29/2022 10:14:04 -------------------------------------------------------------------------------- Physical Exam Details Patient Name: Date of Service: [name], [name] 06/29/2022 9:15 A M Medical Record Number: [id number] Patient Account Number: [id number] Date of Birth/Sex: Treating RN: [birth year] (88 y.o. F) Primary Care Provider: Marda Stalker Other Clinician: Referring Provider: Treating Provider/Extender: Marjo Bicker Weeks in Treatment: 30 Constitutional respirations regular, non-labored and within target range for patient.. Cardiovascular 2+ dorsalis pedis/posterior tibialis pulses. Psychiatric pleasant and cooperative. Notes Left lower extremity: T the lateral aspect there is an open wound with granulation tissue and nonviable tissue present. No surrounding signs of infection. o Electronic Signature(s) Signed: 06/29/2022 10:16:18 AM By: Kalman Shan DO Entered By: Kalman Shan on 06/29/2022 10:14:26 -------------------------------------------------------------------------------- Physician Orders Details Patient Name: Date of Service: [name], [name] 06/29/2022 9:15 A M Medical Record Number: [id number] Patient Account Number: [id number] Date of Birth/Sex: Treating RN: 21-Sep-1925 (88 y.o. [name], [name]: Marda Stalker Other Clinician: Referring Provider: Treating Provider/Extender: Aviva Signs in Treatment: 30 Verbal / Phone Orders: No Diagnosis Coding Follow-up Appointments ppointment in 1 week. - 07/06/22 @ 0915 w/ Dr. Heber [place] and Allayne Butcher Room # 9 Return A Cellular or Tissue Based Products Cellular or Tissue Based Product Type: - Vendaje #2 applied 06/14/2022 Vendaje #3 applied 06/22/2022 Vendaje # 4 applied 06/29/22 Cellular or Tissue Based Product applied to wound bed, secured with steri-strips, cover with Adaptic or Mepitel. (DO NOT REMOVE). Bathing/  Shower/ Hygiene May shower with protection but do not get wound dressing(s) wet. - use cast protector. May shower and wash wound with soap and water. - with dressing changes may wash with soap and water. Edema Control - Lymphedema / SCD / Other Elevate legs to the level of the heart or above for 30 minutes daily and/or when sitting, a frequency of: Avoid standing for long periods of time. Patient to wear own compression stockings every day. - apply in the morning and remove at night to right leg. Exercise regularly Moisturize legs daily. - every night before day right leg. Additional Orders / Instructions Follow Nutritious Diet - High Protein Diet Home Health No change in wound care orders this week; continue [facility]  for wound care. May utilize formulary equivalent dressing for wound treatment orders unless otherwise specified. - Home Health for skilled wound care nursing once a week, wound care once a week ***SKIN SUB PLACED; DO NOT REMOVE; DO NOT GET WET ONLY TAKE OFF OUTER DRESSING AND RE-WRAP**** ; NO ZINC OXIDE!! Other Home Health Orders/Instructions: [facility] home health Wound Treatment Wound #26 - Ankle Wound Laterality: Left, Lateral Cleanser: Soap and Water (Home Health) 2 x Per Week/30 Days Discharge Instructions: May shower and wash wound with dial antibacterial soap and water prior to dressing change. Peri-Wound Care: Sween Lotion (Moisturizing lotion) (Home Health) 2 x Per Week/30 Days Discharge Instructions: Apply moisturizing lotion as directed Prim Dressing: VENDAJE SKIN SUB, ADAPTIC, STERI STRIPS 2 x Per Week/30 Days ary Discharge Instructions: applied directly to wound bed by provider. Secondary Dressing: Zetuvit Plus 4x8 in [facility]) 2 x Per Week/30 Days Discharge Instructions: Apply over primary dressing as directed. Compression Wrap: ThreePress (3 layer compression wrap) (Home Health) 2 x Per Week/30 Days Discharge Instructions: Apply three layer  compression as directed. Electronic Signature(s) Signed: 06/29/2022 10:16:18 AM By: Kalman Shan DO Entered By: Kalman Shan on 06/29/2022 10:14:42 -------------------------------------------------------------------------------- Problem List Details Patient Name: Date of Service: [name], [name] 06/29/2022 9:15 A M Medical Record Number: [id number] Patient Account Number: [id number] Date of Birth/Sex: Treating RN: [birth year] (88 y.o. F) Primary Care Provider: Marda Stalker Other Clinician: Referring Provider: Treating Provider/Extender: Aviva Signs in Treatment: 30 Active Problems ICD-10 Encounter Code Description Active Date MDM Diagnosis I87.312 Chronic venous hypertension (idiopathic) with ulcer of left lower extremity 11/27/2021 No Yes L97.822 Non-pressure chronic ulcer of other part of left lower leg with fat layer exposed1/04/2022 No Yes C50.919 Malignant neoplasm of unspecified site of unspecified female breast 11/27/2021 No Yes I50.42 Chronic combined systolic (congestive) and diastolic (congestive) heart failure 11/27/2021 No Yes G30.9 Alzheimer's disease, unspecified 11/27/2021 No Yes C54.1 Malignant neoplasm of endometrium 11/27/2021 No Yes Inactive Problems Resolved Problems Electronic Signature(s) Signed: 06/29/2022 10:16:18 AM By: Kalman Shan DO Entered By: Kalman Shan on 06/29/2022 10:13:22 -------------------------------------------------------------------------------- Progress Note Details Patient Name: Date of Service: [name], [name] 06/29/2022 9:15 A M Medical Record Number: [id number] Patient Account Number: [id number] Date of Birth/Sex: Treating RN: 06/10/1925 (88 y.o. F) Primary Care Provider: Marda Stalker Other Clinician: Referring Provider: Treating Provider/Extender: Aviva Signs in Treatment: 30 Subjective Chief Complaint Information obtained from Patient Left lower  extremity wound History of Present Illness (HPI) this is a patient we know from several prior wounds on her bilateral lower extremities. She has venous stasis physiology, inflammation and hypertension. She is been fully evaluated for venous ablation and was found not to be a candidate. She does not have significant arterial disease. 02/14/15 the wound itself on her lateral left leg did not look too bad however there was surrounding maceration which was concerning 02/27/15 the wound itself is small with surrounding circumferential epithelialization there is no surrounding maceration. 03/06/15 only a very small area remains. I think this is mostly epithelialized however I would be uncomfortable not dressing this this week 03/13/15 the area is epithelialized. There was a thick surface on this. I took a #15 blade then lightly disrupted it but there does not appear to be any open area I did not see anything but appropriate epithelium Readmission: 08/16/18 on evaluation today patient presents for initial evaluation and our clinic released readmission although she has not been seen since April 2016. Nonetheless she is having issues  with the ulcers currently on the bilateral lateral malleolus or locations as well as the left lateral lower extremity. These have been present for roughly 3 weeks since the patient actually developed significant bilateral lower extremity edema which patient's daughter states was roughly 3 times the size of her legs currently. Subsequently she ended up in the emerging department due to congestive heart failure. They were able to get the swelling under control and fortunately she seems to be doing much better at this point in time. She was placed in an [employer] which she sat on for week part evaluation today as well. Fortunately the wound areas actually seem to be doing fairly well there is some macerated skin surrounding there are the areas/surface of the wound which are gonna  require some debridement at this point. No fevers, chills, nausea, or vomiting noted at this time. Patient has a history of hypertension, mild peripheral vascular disease, chronic venous stasis, and other than the ulcer seems to be doing fairly well today. No fevers, chills, nausea, or vomiting noted at this time. 08/23/18 on evaluation today patient actually appears to be doing great in regard to her bilateral lower should be ulcers. She's shown signs of improvement even just with one week with the [employer]. In general I'm actually very pleased with how things appear at this point. 09/06/18 on evaluation today patient actually appears to be doing much better her left lower extremity is completely healed. Go to the right lower extremity lateral malleolus ulcer this still seems to be showing signs of being open. There does not appear to be evidence of infection which is good news. This did require some sharp debridement however to remove some of the necrotic tissue/eschar on the surface of the wound Readmission 11/27/2021 [name] Specht is a 88 year old female with a past medical history of endometrial and breast cancer, chronic venous insufficiency, dementia and essential hypertension that presents to the clinic with a 1 month history of nonhealing ulcer to the left lower extremity. She states that the ulcer opened 1 month ago and then healed but has reopened again in the past week. She is currently keeping the area covered. She has a history of wounds to her lower extremities. She has compression stockings that she uses daily. She currently denies signs of infection. 1/13; patient presents for follow-up. She followed up for nurse visit for wrap change. She reports more odor. She denies pain. 1/20; patient presents for follow-up. She has been using gentamicin ointment with calcium alginate to the wound bed. She reports improvement in odor and drainage. She currently denies systemic signs of  infection. 1/27; patient presents for follow-up. She has been using gentamicin ointment with calcium alginate. She received her Keystone antibiotic 2 days ago. She brought this in. Home health has also been established. They will come out for the first time next week. She currently denies signs of infection. She has no issues or complaints today. 2/2; patient presents for follow-up. She has been using Keystone antibiotics with [facility] with no issues. She denies signs of infection. She has no issues or complaints today. 2/9; patient presents for follow-up. She has been using Keystone antibiotics with [facility] with no issues. She has tolerated the compression wrap well. She denies signs of infection. 2/16; patient presents for follow-up. She has been using Keystone antibiotics with Hydrofera Blue under compression wrap with no issues. She currently denies signs of infection. 2/23; patient presents for follow-up. She continues to  use Keystone antibiotics with Hydrofera Blue under the compression wrap. She has no issues or complaints today. She denies signs of infection. Home health continues to come out and change the wrap. 3/2; patient presents for follow-up. She continues to use [facility] with [facility] under compression with no issues. She denies signs of infection. 3/16; patient presents for follow-up. She continues to use [facility] with [facility] under compression with no issues. Home health change the dressing last week. She denies signs of infection. 3/23; patient presents for follow-up. She continues to use Keystone antibiotics with Hydrofera Blue under compression with no issues. Home health continues to change the dressings. She denies signs of infection. 3/30; patient presents for follow-up. She continues to use Keystone antibiotics with Hydrofera Blue under compression with no issues. Home health continues to change the dressings. 4/13 2-week follow-up. Keystone and  Hydrofera Blue wound is roughly 2.3 cm in diameter smaller. No other issues 4/20; patient presents for follow-up. She continues to use Keystone antibiotic with [facility] with no issues. 4/28; patient with a difficult wound on her right medial lower leg. Using [facility] Blue minimal improvement per our intake nurse we are using compression. She has home health changing the dressing 5/5; patient presents for follow-up. We have been using Keystone antibiotics and Hydrofera Blue under compression therapy. She has no issues or complaints today. We discussed potentially using an advanced tissue product. She states she has had this done in the past and would like to proceed to see what her insurance would cover. 5/15; patient presents for follow-up. She has been using Keystone antibiotics and Hydrofera Blue under compression therapy. She forgot her Keystone antibiotics today. Insurance has not approved her for skin substitute. She currently denies signs of infection. She has home health that changes the dressing once a week. 5/19; patient presents for follow-up. We have been using Keystone antibiotic and Hydrofera Blue under compression therapy. She has no issues or complaints today. The [employer] rep confirmed over the phone that the patient has been 100% approved for their skin substitute. We will verify this as we have not received any paperwork stating this. Patient states she would like to proceed with a skin substitute if it is covered by insurance. 6/1; patient presents for follow-up. We have been using Keystone antibiotic and Hydrofera Blue under compression therapy. We confirmed that she does have a co-pay for the [employer] skin substitute. We will hold off on using this for now since she continues to show improvement with current therapy. She denies signs of infection. She has home health that comes out twice weekly. 6/16; patient presents for follow-up. We have been using Keystone  antibiotic and Hydrofera Blue under compression therapy. She has no issues or complaints today. We discussed the potential trial of vendaje. This is placenta tissue. She states she would like to try the free trial. We will reach out to our rep. 6/30; patient presents for follow-up. She has been using Keystone antibiotic and Hydrofera Blue under compression therapy. She has been approved for a trial of vendaje. She would like to proceed with this and We will have this available at next clinic visit. She has no issues or complaints today. 7/18; patient presents for follow-up. She has been using Keystone antibiotic and Hydrofera Blue under compression. She has no issues or complaints today. We have a free sample of vendaje skin substitute and patient is agreeable to having this placed today. 7/24; patient presents for follow-up. We placed the  first free trial of vendaje last week under compression wrap. Patient had no issues or complaints today. She tolerated this well. 8/1; patient presents for follow-up. Vandaje #2 donated product was placed in standard fashion last week under compression therapy. She has no issues or complaints today. 8/8; patient presents for follow-up. Vendaje #3 donated skin set was placed in standard fashion last visit under compression therapy. She has no issues or complaints today. Patient History Information obtained from Patient. Family History Cancer - Child, Hypertension - Child,Father, Kidney Disease - Child, Stroke - Siblings,Child, Thyroid Problems - Child, No family history of Diabetes, Heart Disease, Hereditary Spherocytosis, Lung Disease, Seizures, Tuberculosis. Social History Never smoker, Marital Status - Widowed, Alcohol Use - Never, Drug Use - No History, Caffeine Use - Never. Medical History Eyes Denies history of Cataracts, Glaucoma, Optic Neuritis Ear/Nose/Mouth/Throat Denies history of Chronic sinus problems/congestion, Middle ear  problems Hematologic/Lymphatic Patient has history of Anemia Denies history of Hemophilia, Human Immunodeficiency Virus, Lymphedema, Sickle Cell Disease Respiratory Denies history of Aspiration, Asthma, Chronic Obstructive Pulmonary Disease (COPD), Pneumothorax, Sleep Apnea, Tuberculosis Cardiovascular Patient has history of Congestive Heart Failure, Hypertension, Peripheral Venous Disease Denies history of Angina, Arrhythmia, Coronary Artery Disease, Hypotension, Myocardial Infarction, Peripheral Arterial Disease, Phlebitis, Vasculitis Gastrointestinal Denies history of Cirrhosis , Colitis, Crohnoos, Hepatitis A, Hepatitis B, Hepatitis C Endocrine Denies history of Type I Diabetes, Type II Diabetes Genitourinary Denies history of End Stage Renal Disease Immunological Denies history of Lupus Erythematosus, Raynaudoos, Scleroderma Integumentary (Skin) Denies history of History of Burn Musculoskeletal Patient has history of Osteoarthritis Denies history of Gout, Rheumatoid Arthritis, Osteomyelitis Neurologic Denies history of Neuropathy, Quadriplegia, Paraplegia, Seizure Disorder Oncologic Patient has history of Received Chemotherapy - Breast and Ovarian Cancer Denies history of Received Radiation Hospitalization/Surgery History - diverticulits. Objective Constitutional respirations regular, non-labored and within target range for patient.. Vitals Time Taken: 9:07 AM, Height: 66 in, Weight: 176 lbs, BMI: 28.4, Temperature: 97.7 F, Pulse: 60 bpm, Respiratory Rate: 17 breaths/min, Blood Pressure: 145/70 mmHg. Cardiovascular 2+ dorsalis pedis/posterior tibialis pulses. Psychiatric pleasant and cooperative. General Notes: Left lower extremity: T the lateral aspect there is an open wound with granulation tissue and nonviable tissue present. No surrounding signs of o infection. Integumentary (Hair, Skin) Wound #26 status is Open. Original cause of wound was Gradually Appeared.  The date acquired was: 11/20/2021. The wound has been in treatment 30 weeks. The wound is located on the Left,Lateral Ankle. The wound measures 1cm length x 0.5cm width x 0.1cm depth; 0.393cm^2 area and 0.039cm^3 volume. There is Fat Layer (Subcutaneous Tissue) exposed. There is no tunneling or undermining noted. There is a medium amount of serosanguineous drainage noted. The wound margin is distinct with the outline attached to the wound base. There is large (67-100%) red granulation within the wound bed. There is a small (1-33%) amount of necrotic tissue within the wound bed including Adherent Slough. Assessment Active Problems ICD-10 Chronic venous hypertension (idiopathic) with ulcer of left lower extremity Non-pressure chronic ulcer of other part of left lower leg with fat layer exposed Malignant neoplasm of unspecified site of unspecified female breast Chronic combined systolic (congestive) and diastolic (congestive) heart failure Alzheimer's disease, unspecified Malignant neoplasm of endometrium Patient's wound is stable. I debrided nonviable tissue. Vendaje #4 was placed in standard fashion today. We will continue with compression therapy. Follow- up in 1 week. Procedures Wound #26 Pre-procedure diagnosis of Wound #26 is a Venous Leg Ulcer located on the Left,Lateral Ankle .Severity of Tissue Pre Debridement is: Fat  layer exposed. There was a Excisional Skin/Subcutaneous Tissue Debridement with a total area of 0.5 sq cm performed by Kalman Shan, DO. With the following instrument(s): Curette to remove Viable and Non-Viable tissue/material. Material removed includes Subcutaneous Tissue and Slough and after achieving pain control using Lidocaine. No specimens were taken. A time out was conducted at 09:57, prior to the start of the procedure. A Minimum amount of bleeding was controlled with Pressure. The procedure was tolerated well with a pain level of 0 throughout and a pain level of  0 following the procedure. Post Debridement Measurements: 1cm length x 0.5cm width x 0.1cm depth; 0.039cm^3 volume. Character of Wound/Ulcer Post Debridement is improved. Severity of Tissue Post Debridement is: Fat layer exposed. Post procedure Diagnosis Wound #26: Same as Pre-Procedure Pre-procedure diagnosis of Wound #26 is a Venous Leg Ulcer located on the Left,Lateral Ankle. A skin graft procedure using a bioengineered skin substitute/cellular or tissue based product was performed by Kalman Shan, DO with the following instrument(s): Forceps. Other was applied and secured with Steri-Strips. 8 sq cm of product was utilized and 0 sq cm was wasted. Post Application, Adaptic was applied. A Time Out was conducted at 10:00, prior to the start of the procedure. The procedure was tolerated well with a pain level of 0 throughout and a pain level of 0 following the procedure. Post procedure Diagnosis Wound #26: Same as Pre-Procedure . Pre-procedure diagnosis of Wound #26 is a Venous Leg Ulcer located on the Left,Lateral Ankle . There was a Three Layer Compression Therapy Procedure by Rhae Hammock, RN. Post procedure Diagnosis Wound #26: Same as Pre-Procedure Plan Follow-up Appointments: Return Appointment in 1 week. - 07/06/22 @ 0915 w/ Dr. Heber Eastover and Allayne Butcher Room # 9 Cellular or Tissue Based Products: Cellular or Tissue Based Product Type: - Vendaje #2 applied 06/14/2022 Vendaje #3 applied 06/22/2022 Vendaje # 4 applied 06/29/22 Cellular or Tissue Based Product applied to wound bed, secured with steri-strips, cover with Adaptic or Mepitel. (DO NOT REMOVE). Bathing/ Shower/ Hygiene: May shower with protection but do not get wound dressing(s) wet. - use cast protector. May shower and wash wound with soap and water. - with dressing changes may wash with soap and water. Edema Control - Lymphedema / SCD / Other: Elevate legs to the level of the heart or above for 30 minutes daily and/or when  sitting, a frequency of: Avoid standing for long periods of time. Patient to wear own compression stockings every day. - apply in the morning and remove at night to right leg. Exercise regularly Moisturize legs daily. - every night before day right leg. Additional Orders / Instructions: Follow Nutritious Diet - High Protein Diet Home Health: No change in wound care orders this week; continue Home Health for wound care. May utilize formulary equivalent dressing for wound treatment orders unless otherwise specified. - Home Health for skilled wound care nursing once a week, wound care once a week ***SKIN SUB PLACED; DO NOT REMOVE; DO NOT GET WET ONLY TAKE OFF OUTER DRESSING AND RE-WRAP**** NO ZINC OXIDE!! ; Other Home Health Orders/Instructions: - [facility] home [facility] WOUND #26: - Ankle Wound Laterality: Left, Lateral Cleanser: Soap and Water (Home Health) 2 x Per Week/30 Days Discharge Instructions: May shower and wash wound with dial antibacterial soap and water prior to dressing change. Peri-Wound Care: Sween Lotion (Moisturizing lotion) (Home Health) 2 x Per Week/30 Days Discharge Instructions: Apply moisturizing lotion as directed Prim Dressing: VENDAJE SKIN SUB, ADAPTIC, STERI STRIPS 2 x Per Week/30 Days  ary Discharge Instructions: applied directly to wound bed by provider. Secondary Dressing: Zetuvit Plus 4x8 in [facility]) 2 x Per Week/30 Days Discharge Instructions: Apply over primary dressing as directed. Com pression Wrap: ThreePress (3 layer compression wrap) (Home Health) 2 x Per Week/30 Days Discharge Instructions: Apply three layer compression as directed. 1. In office sharp debridement 2. Vendaje #4 placed in standard fashion 3. 3 layer compression 4. Follow-up in 1 week Electronic Signature(s) Signed: 06/29/2022 10:16:18 AM By: Kalman Shan DO Entered By: Kalman Shan on 06/29/2022  10:15:37 -------------------------------------------------------------------------------- HxROS Details Patient Name: Date of Service: ELLICE, BOULTINGHOUSE [name] 06/29/2022 9:15 A M Medical Record Number: [id number] Patient Account Number: [id number] Date of Birth/Sex: Treating RN: 05-24-1925 (88 y.o. F) Primary Care Provider: Marda Stalker Other Clinician: Referring Provider: Treating Provider/Extender: Aviva Signs in Treatment: 30 Information Obtained From Patient Eyes Medical History: Negative for: Cataracts; Glaucoma; Optic Neuritis Ear/Nose/Mouth/Throat Medical History: Negative for: Chronic sinus problems/congestion; Middle ear problems Hematologic/Lymphatic Medical History: Positive for: Anemia Negative for: Hemophilia; Human Immunodeficiency Virus; Lymphedema; Sickle Cell Disease Respiratory Medical History: Negative for: Aspiration; Asthma; Chronic Obstructive Pulmonary Disease (COPD); Pneumothorax; Sleep Apnea; Tuberculosis Cardiovascular Medical History: Positive for: Congestive Heart Failure; Hypertension; Peripheral Venous Disease Negative for: Angina; Arrhythmia; Coronary Artery Disease; Hypotension; Myocardial Infarction; Peripheral Arterial Disease; Phlebitis; Vasculitis Gastrointestinal Medical History: Negative for: Cirrhosis ; Colitis; Crohns; Hepatitis A; Hepatitis B; Hepatitis C Endocrine Medical History: Negative for: Type I Diabetes; Type II Diabetes Genitourinary Medical History: Negative for: End Stage Renal Disease Immunological Medical History: Negative for: Lupus Erythematosus; Raynauds; Scleroderma Integumentary (Skin) Medical History: Negative for: History of Burn Musculoskeletal Medical History: Positive for: Osteoarthritis Negative for: Gout; Rheumatoid Arthritis; Osteomyelitis Neurologic Medical History: Negative for: Neuropathy; Quadriplegia; Paraplegia; Seizure Disorder Oncologic Medical  History: Positive for: Received Chemotherapy - Breast and Ovarian Cancer Negative for: Received Radiation Immunizations Pneumococcal Vaccine: Received Pneumococcal Vaccination: Yes Received Pneumococcal Vaccination On or After 60th Birthday: Yes Implantable Devices None Hospitalization / Surgery History Type of Hospitalization/Surgery diverticulits Family and Social History Cancer: Yes - Child; Diabetes: No; Heart Disease: No; Hereditary Spherocytosis: No; Hypertension: Yes - Child,Father; Kidney Disease: Yes - Child; Lung Disease: No; Seizures: No; Stroke: Yes - Siblings,Child; Thyroid Problems: Yes - Child; Tuberculosis: No; Never smoker; Marital Status - Widowed; Alcohol Use: Never; Drug Use: No History; Caffeine Use: Never; Financial Concerns: No; Food, Clothing or Shelter Needs: No; Support System Lacking: No; Transportation Concerns: No Electronic Signature(s) Signed: 06/29/2022 10:16:18 AM By: Kalman Shan DO Entered By: Kalman Shan on 06/29/2022 10:14:09 -------------------------------------------------------------------------------- SuperBill Details Patient Name: Date of Service: CLETA, HEATLEY [name] 06/29/2022 Medical Record Number: [id number] Patient Account Number: [id number] Date of Birth/Sex: Treating RN: 09-09-1925 (88 y.o. [name], [name]: Marda Stalker Other Clinician: Referring Provider: Treating Provider/Extender: Marjo Bicker Weeks in Treatment: 30 Diagnosis Coding ICD-10 Codes Code Description [phone] Chronic venous hypertension (idiopathic) with ulcer of left lower extremity L97.822 Non-pressure chronic ulcer of other part of left lower leg with fat layer exposed C50.919 Malignant neoplasm of unspecified site of unspecified female breast I50.42 Chronic combined systolic (congestive) and diastolic (congestive) heart failure G30.9 Alzheimer's disease, unspecified C54.1 Malignant neoplasm of  endometrium Facility Procedures CPT4 Code: [id number] Description: 11042 - DEB SUBQ TISSUE 20 SQ CM/< ICD-10 Diagnosis Description L97.822 Non-pressure chronic ulcer of other part of left lower leg with fat layer expo Modifier: sed Quantity: 1 Physician Procedures : CPT4 Code Description Modifier [id number] 11042 - WC PHYS SUBQ TISS 20 SQ  CM ICD-10 Diagnosis Description L97.822 Non-pressure chronic ulcer of other part of left lower leg with fat layer exposed Quantity: 1 Notes vendaje donated product Electronic Signature(s) Signed: 06/29/2022 10:16:18 AM By: Kalman Shan DO Entered By: Kalman Shan on 06/29/2022 10:15:46

## 2022-06-29 NOTE — Progress Notes (Signed)
KIMERLY, ROWAND ([id number]) Visit Report for 06/29/2022 Arrival Information Details Patient Name: Date of Service: [name], [name] 06/29/2022 9:15 A M Medical Record Number: [id number] Patient Account Number: [id number] Date of Birth/Sex: Treating RN: 08-30-1925 (88 y.o. F) Primary Care Athelene Hursey: Marda Stalker Other Clinician: Referring Gianna Calef: Treating Martasia Talamante/Extender: Aviva Signs in Treatment: 45 Visit Information History Since Last Visit Added or deleted any medications: No Patient Arrived: Wheel Chair Any new allergies or adverse reactions: No Arrival Time: 09:07 Had a fall or experienced change in No Accompanied By: Daughter activities of daily living that may affect Transfer Assistance: None risk of falls: Patient Identification Verified: Yes Signs or symptoms of abuse/neglect since last visito No Secondary Verification Process Completed: Yes Hospitalized since last visit: No Patient Requires Transmission-Based Precautions: No Implantable device outside of the clinic excluding No Patient Has Alerts: No cellular tissue based products placed in the center since last visit: Has Dressing in Place as Prescribed: Yes Has Compression in Place as Prescribed: Yes Pain Present Now: No Electronic Signature(s) Signed: 06/29/2022 12:26:25 PM By: Erenest Blank Entered By: Erenest Blank on 06/29/2022 09:07:37 -------------------------------------------------------------------------------- Compression Therapy Details Patient Name: Date of Service: [name], [name] RNESTINE 06/29/2022 9:15 A M Medical Record Number: [id number] Patient Account Number: [id number] Date of Birth/Sex: Treating RN: 09/29/1925 (88 y.o. [name], [name] Primary Care Avayah Raffety: Marda Stalker Other Clinician: Referring Daquon Greenleaf: Treating Kolbi Tofte/Extender: Marjo Bicker Weeks in Treatment: 30 Compression Therapy Performed for Wound Assessment: Wound  #26 Left,Lateral Ankle Performed By: Clinician Rhae Hammock, RN Compression Type: Three Layer Post Procedure Diagnosis Same as Pre-procedure Electronic Signature(s) Signed: 06/29/2022 4:31:06 PM By: [name] Entered By: Rhae Hammock on 06/29/2022 09:36:07 -------------------------------------------------------------------------------- Encounter Discharge Information Details Patient Name: Date of Service: [name], [name] RNESTINE 06/29/2022 9:15 A M Medical Record Number: [id number] Patient Account Number: [id number] Date of Birth/Sex: Treating RN: Apr 16, 1925 (88 y.o. [name], [name] Primary Care Walker Paddack: Marda Stalker Other Clinician: Referring Jeriah Corkum: Treating Denajah Farias/Extender: Aviva Signs in Treatment: 30 Encounter Discharge Information Items Post Procedure Vitals Discharge Condition: Stable Temperature (F): 98.7 Ambulatory Status: Wheelchair Pulse (bpm): 74 Discharge Destination: Home Respiratory Rate (breaths/min): 17 Transportation: Private Auto Blood Pressure (mmHg): 134/74 Accompanied By: self Schedule Follow-up Appointment: Yes Clinical Summary of Care: Patient Declined Electronic Signature(s) Signed: 06/29/2022 4:31:06 PM By: [name] Entered By: Rhae Hammock on 06/29/2022 10:15:59 -------------------------------------------------------------------------------- Lower Extremity Assessment Details Patient Name: Date of Service: [name], [name] RNESTINE 06/29/2022 9:15 A M Medical Record Number: [id number] Patient Account Number: [id number] Date of Birth/Sex: Treating RN: 06/26/1925 (88 y.o. F) Primary Care Mercades Bajaj: Marda Stalker Other Clinician: Referring Ena Demary: Treating Akshath Mccarey/Extender: Marjo Bicker Weeks in Treatment: 30 Edema Assessment Assessed: [Left: No] [Right: No] Edema: [Left: N] [Right: o] Calf Left: Right: Point of Measurement: 30 cm From Medial Instep  31.9 cm Ankle Left: Right: Point of Measurement: 11 cm From Medial Instep 21.6 cm Vascular Assessment Pulses: Dorsalis Pedis Palpable: [Left:Yes] Electronic Signature(s) Signed: 06/29/2022 12:26:25 PM By: Erenest Blank Entered By: Erenest Blank on 06/29/2022 09:20:03 -------------------------------------------------------------------------------- Multi Wound Chart Details Patient Name: Date of Service: [name], [name] RNESTINE 06/29/2022 9:15 A M Medical Record Number: [id number] Patient Account Number: [id number] Date of Birth/Sex: Treating RN: 06-21-1925 (88 y.o. F) Primary Care Bakary Bramer: Marda Stalker Other Clinician: Referring Emiliana Blaize: Treating Jamylah Marinaccio/Extender: Aviva Signs in Treatment: 30 Vital Signs Height(in): 66 Pulse(bpm): 60 Weight(lbs): 176 Blood Pressure(mmHg): 145/70 Body Mass Index(BMI): 28.4 Temperature(F): 97.7 Respiratory Rate(breaths/min): 17 Photos: [N/A:N/A] Left,  Lateral Ankle N/A N/A Wound Location: Gradually Appeared N/A N/A Wounding Event: Venous Leg Ulcer N/A N/A Primary Etiology: Anemia, Congestive Heart Failure, N/A N/A Comorbid History: Hypertension, Peripheral Venous Disease, Osteoarthritis, Received Chemotherapy 11/20/2021 N/A N/A Date Acquired: 30 N/A N/A Weeks of Treatment: Open N/A N/A Wound Status: No N/A N/A Wound Recurrence: 1x0.5x0.1 N/A N/A Measurements L x W x D (cm) 0.393 N/A N/A A (cm) : rea 0.039 N/A N/A Volume (cm) : 95.80% N/A N/A % Reduction in A rea: 95.90% N/A N/A % Reduction in Volume: Full Thickness Without Exposed N/A N/A Classification: Support Structures Medium N/A N/A Exudate A mount: Serosanguineous N/A N/A Exudate Type: red, brown N/A N/A Exudate Color: Distinct, outline attached N/A N/A Wound Margin: Large (67-100%) N/A N/A Granulation A mount: Red N/A N/A Granulation Quality: Small (1-33%) N/A N/A Necrotic A mount: Fat Layer (Subcutaneous Tissue):  Yes N/A N/A Exposed Structures: Fascia: No Tendon: No Muscle: No Joint: No Bone: No Medium (34-66%) N/A N/A Epithelialization: Debridement - Excisional N/A N/A Debridement: Pre-procedure Verification/Time Out 09:57 N/A N/A Taken: Lidocaine N/A N/A Pain Control: Subcutaneous, Slough N/A N/A Tissue Debrided: Skin/Subcutaneous Tissue N/A N/A Level: 0.5 N/A N/A Debridement A (sq cm): rea Curette N/A N/A Instrument: Minimum N/A N/A Bleeding: Pressure N/A N/A Hemostasis A chieved: 0 N/A N/A Procedural Pain: 0 N/A N/A Post Procedural Pain: Procedure was tolerated well N/A N/A Debridement Treatment Response: 1x0.5x0.1 N/A N/A Post Debridement Measurements L x W x D (cm) 0.039 N/A N/A Post Debridement Volume: (cm) Cellular or Tissue Based Product N/A N/A Procedures Performed: Compression Therapy Debridement Treatment Notes Electronic Signature(s) Signed: 06/29/2022 10:16:18 AM By: Kalman Shan DO Entered By: Kalman Shan on 06/29/2022 10:13:27 -------------------------------------------------------------------------------- Multi-Disciplinary Care Plan Details Patient Name: Date of Service: [name], [name] RNESTINE 06/29/2022 9:15 A M Medical Record Number: [id number] Patient Account Number: [id number] Date of Birth/Sex: Treating RN: 06-Jul-1925 (88 y.o. [name], [name] Primary Care Kee Drudge: Marda Stalker Other Clinician: Referring Sarahlynn Cisnero: Treating Madhuri Vacca/Extender: Aviva Signs in Treatment: 30 Active Inactive Nutrition Nursing Diagnoses: Imbalanced nutrition Goals: Patient/caregiver agrees to and verbalizes understanding of need to use nutritional supplements and/or vitamins as prescribed Date Initiated: 11/27/2021 Target Resolution Date: 07/23/2022 Goal Status: Active Interventions: Assess patient nutrition upon admission and as needed per policy Provide education on nutrition Treatment Activities: Education provided  on Nutrition : 06/22/2022 Notes: 06/22/2022 #3 vendaje advance tissue product applied by product, compression continued. Electronic Signature(s) Signed: 06/29/2022 4:31:06 PM By: [name] Entered By: Rhae Hammock on 06/29/2022 09:35:24 -------------------------------------------------------------------------------- Pain Assessment Details Patient Name: Date of Service: LENIYAH, MARTELL RNESTINE 06/29/2022 9:15 A M Medical Record Number: [id number] Patient Account Number: [id number] Date of Birth/Sex: Treating RN: 04/01/1925 (88 y.o. F) Primary Care Karter Hellmer: Marda Stalker Other Clinician: Referring Maxie Debose: Treating Zelma Mazariego/Extender: Marjo Bicker Weeks in Treatment: 30 Active Problems Location of Pain Severity and Description of Pain Patient Has Paino No Site Locations Pain Management and Medication Current Pain Management: Electronic Signature(s) Signed: 06/29/2022 12:26:25 PM By: Erenest Blank Entered By: Erenest Blank on 06/29/2022 09:08:01 -------------------------------------------------------------------------------- Patient/Caregiver Education Details Patient Name: Date of Service: [name] RNESTINE 8/8/2023andnbsp9:15 A M Medical Record Number: [id number] Patient Account Number: [id number] Date of Birth/Gender: Treating RN: May 24, 1925 (88 y.o. Benjaman Lobe Primary Care Physician: Marda Stalker Other Clinician: Referring Physician: Treating Physician/Extender: Aviva Signs in Treatment: 30 Education Assessment Education Provided To: Patient Education Topics Provided Nutrition: Methods: Explain/Verbal Responses: Reinforcements needed, State content correctly Electronic Signature(s) Signed: 06/29/2022 4:31:06  PM By: [name] Entered By: Rhae Hammock on 06/29/2022 09:35:36 -------------------------------------------------------------------------------- Wound Assessment  Details Patient Name: Date of Service: [name], [name] RNESTINE 06/29/2022 9:15 A M Medical Record Number: [id number] Patient Account Number: [id number] Date of Birth/Sex: Treating RN: 14-Nov-1925 (88 y.o. F) Primary Care Cailan General: Marda Stalker Other Clinician: Referring Taje Tondreau: Treating Brayam Boeke/Extender: Marjo Bicker Weeks in Treatment: 30 Wound Status Wound Number: 26 Primary Venous Leg Ulcer Etiology: Wound Location: Left, Lateral Ankle Wound Open Wounding Event: Gradually Appeared Status: Date Acquired: 11/20/2021 Comorbid Anemia, Congestive Heart Failure, Hypertension, Peripheral Weeks Of Treatment: 30 History: Venous Disease, Osteoarthritis, Received Chemotherapy Clustered Wound: No Photos Wound Measurements Length: (cm) 1 Width: (cm) 0.5 Depth: (cm) 0.1 Area: (cm) 0.393 Volume: (cm) 0.039 % Reduction in Area: 95.8% % Reduction in Volume: 95.9% Epithelialization: Medium (34-66%) Tunneling: No Undermining: No Wound Description Classification: Full Thickness Without Exposed Support Structures Wound Margin: Distinct, outline attached Exudate Amount: Medium Exudate Type: Serosanguineous Exudate Color: red, brown Foul Odor After Cleansing: No Slough/Fibrino Yes Wound Bed Granulation Amount: Large (67-100%) Exposed Structure Granulation Quality: Red Fascia Exposed: No Necrotic Amount: Small (1-33%) Fat Layer (Subcutaneous Tissue) Exposed: Yes Necrotic Quality: Adherent Slough Tendon Exposed: No Muscle Exposed: No Joint Exposed: No Bone Exposed: No Treatment Notes Wound #26 (Ankle) Wound Laterality: Left, Lateral Cleanser Soap and Water Discharge Instruction: May shower and wash wound with dial antibacterial soap and water prior to dressing change. Peri-Wound Care Sween Lotion (Moisturizing lotion) Discharge Instruction: Apply moisturizing lotion as directed Topical Primary Dressing [place], [place], [place] Discharge Instruction: applied directly to wound bed by Grover Robinson. Secondary Dressing Zetuvit Plus 4x8 in Discharge Instruction: Apply over primary dressing as directed. Secured With Compression Wrap ThreePress (3 layer compression wrap) Discharge Instruction: Apply three layer compression as directed. Compression Stockings Add-Ons Electronic Signature(s) Signed: 06/29/2022 12:26:25 PM By: Erenest Blank Entered By: Erenest Blank on 06/29/2022 09:22:18 -------------------------------------------------------------------------------- Vitals Details Patient Name: Date of Service: [name], [name] RNESTINE 06/29/2022 9:15 A M Medical Record Number: [id number] Patient Account Number: [id number] Date of Birth/Sex: Treating RN: 05/01/1925 (88 y.o. F) Primary Care Farrell Pantaleo: Marda Stalker Other Clinician: Referring Arrion Burruel: Treating Ramandeep Arington/Extender: Aviva Signs in Treatment: 30 Vital Signs Time Taken: 09:07 Temperature (F): 97.7 Height (in): 66 Pulse (bpm): 60 Weight (lbs): 176 Respiratory Rate (breaths/min): 17 Body Mass Index (BMI): 28.4 Blood Pressure (mmHg): 145/70 Reference Range: 80 - 120 mg / dl Electronic Signature(s) Signed: 06/29/2022 12:26:25 PM By: Erenest Blank Entered By: Erenest Blank on 06/29/2022 09:07:55

## 2022-06-30 ENCOUNTER — Ambulatory Visit: Payer: Medicare PPO | Admitting: Podiatry

## 2022-06-30 ENCOUNTER — Encounter: Payer: Self-pay | Admitting: Podiatry

## 2022-06-30 DIAGNOSIS — M79675 Pain in left toe(s): Secondary | ICD-10-CM

## 2022-06-30 DIAGNOSIS — I739 Peripheral vascular disease, unspecified: Secondary | ICD-10-CM

## 2022-06-30 DIAGNOSIS — B351 Tinea unguium: Secondary | ICD-10-CM

## 2022-06-30 DIAGNOSIS — M79674 Pain in right toe(s): Secondary | ICD-10-CM | POA: Diagnosis not present

## 2022-07-05 NOTE — Progress Notes (Signed)
  Subjective:  Patient ID: [name], female    DOB: [birth year],  MRN: [id number]  [name] presents to clinic today for for at risk foot care. Patient has h/o PAD and corn(s) lateral IPJ of L hallux and medial DIPJ of L 2nd toe and painful thick toenails that are difficult to trim. Painful toenails interfere with ambulation. Aggravating factors include wearing enclosed shoe gear. Pain is relieved with periodic professional debridement. Painful corns are aggravated when weightbearing when wearing enclosed shoe gear. Pain is relieved with periodic professional debridement.  Patient is accompanied by her daughter, [name], on today's visit.   [name] is still attending [facility] for LLE wound. It is healing slowly.  New problem(s): None.   PCP is Marda Stalker, PA-C , and last visit was  December 09, 2021  No Known Allergies  Review of Systems: Negative except as noted in the HPI.  Objective: No changes noted in today's physical examination. General: [name] is a pleasant 88 y.o. African American female, thin build in NAD. AAO x 3.   Vascular:  CFT <3 seconds b/l LE. Palpable DP pulse(s) b/l LE. Diminished PT pulse(s) b/l LE. Pedal hair absent. No pain with calf compression b/l. BK dressing LLE which is clean, dry and intact. Wearing compression stocking RLE.  Dermatological:  Area of pressure noted medial aspect left 1st metatarsal head. Skin remains intact and area is dime-size. [name] Pedal skin thin, shiny and atrophic b/l LE. No interdigital macerations noted b/l LE. Minimal hyperkeratosis L hallux and L 2nd toe.  No erythema, no edema, no drainage, no fluctuance.   Musculoskeletal:  Normal muscle strength 5/5 to all lower extremity muscle groups bilaterally. HAV with bunion deformity noted b/l LE. Hammertoe(s) noted to the L 2nd toe.[name] No pain, crepitus or joint limitation noted with ROM b/l LE.  Patient ambulates independently without assistive  aids.   Neurological:  Protective sensation intact 5/5 intact bilaterally with 10g monofilament b/l. Vibratory sensation intact b/l.   Assessment/Plan: 1. Pain due to onychomycosis of toenails of both feet   2. PAD (peripheral artery disease) ([place])   -Patient was evaluated and treated. All patient's and/or POA's questions/concerns answered on today's visit. -No new findings. No new orders. -She is seeing Dr. Heber [place] in [place] for LLE wound. Continue compression hose RLE. -Mycotic toenails 1-5 bilaterally were debrided in length and girth with sterile nail nippers and dremel without incident. -Continue interdigital padding 1st webspace left foot to afftected digits daily for protection. -Patient/POA to call should there be question/concern in the interim.   Return in about 3 months (around 09/30/2022).  Marzetta Board, DPM

## 2022-07-06 ENCOUNTER — Encounter (HOSPITAL_BASED_OUTPATIENT_CLINIC_OR_DEPARTMENT_OTHER): Payer: Medicare PPO | Admitting: Internal Medicine

## 2022-07-06 DIAGNOSIS — I87312 Chronic venous hypertension (idiopathic) with ulcer of left lower extremity: Secondary | ICD-10-CM | POA: Diagnosis not present

## 2022-07-06 DIAGNOSIS — L97822 Non-pressure chronic ulcer of other part of left lower leg with fat layer exposed: Secondary | ICD-10-CM

## 2022-07-06 DIAGNOSIS — Z853 Personal history of malignant neoplasm of breast: Secondary | ICD-10-CM | POA: Diagnosis not present

## 2022-07-06 DIAGNOSIS — I5042 Chronic combined systolic (congestive) and diastolic (congestive) heart failure: Secondary | ICD-10-CM | POA: Diagnosis not present

## 2022-07-06 DIAGNOSIS — I739 Peripheral vascular disease, unspecified: Secondary | ICD-10-CM | POA: Diagnosis not present

## 2022-07-06 DIAGNOSIS — I110 Hypertensive heart disease with heart failure: Secondary | ICD-10-CM | POA: Diagnosis not present

## 2022-07-06 DIAGNOSIS — G309 Alzheimer's disease, unspecified: Secondary | ICD-10-CM | POA: Diagnosis not present

## 2022-07-08 ENCOUNTER — Ambulatory Visit: Payer: Medicare PPO | Admitting: Family Medicine

## 2022-07-13 ENCOUNTER — Encounter (HOSPITAL_BASED_OUTPATIENT_CLINIC_OR_DEPARTMENT_OTHER): Payer: Medicare PPO | Admitting: Internal Medicine

## 2022-07-13 DIAGNOSIS — I87312 Chronic venous hypertension (idiopathic) with ulcer of left lower extremity: Secondary | ICD-10-CM | POA: Diagnosis not present

## 2022-07-13 DIAGNOSIS — G309 Alzheimer's disease, unspecified: Secondary | ICD-10-CM | POA: Diagnosis not present

## 2022-07-13 DIAGNOSIS — I110 Hypertensive heart disease with heart failure: Secondary | ICD-10-CM | POA: Diagnosis not present

## 2022-07-13 DIAGNOSIS — I5042 Chronic combined systolic (congestive) and diastolic (congestive) heart failure: Secondary | ICD-10-CM | POA: Diagnosis not present

## 2022-07-13 DIAGNOSIS — L97822 Non-pressure chronic ulcer of other part of left lower leg with fat layer exposed: Secondary | ICD-10-CM

## 2022-07-13 DIAGNOSIS — I739 Peripheral vascular disease, unspecified: Secondary | ICD-10-CM | POA: Diagnosis not present

## 2022-07-13 DIAGNOSIS — Z853 Personal history of malignant neoplasm of breast: Secondary | ICD-10-CM | POA: Diagnosis not present

## 2022-07-13 NOTE — Progress Notes (Signed)
[name] [name], [name] [name] ([id number]) Visit Report for 07/13/2022 Chief Complaint Document Details Patient Name: Date of Service: [name] [name], [name] [name] 07/13/2022 10:00 A M Medical Record Number: [id number] Patient Account Number: [id number] Date of Birth/Sex: Treating RN: 1925/09/14 (88 y.o. [name] [name] Primary Care Provider: Marda [name] Other Clinician: Referring Provider: Treating Provider/Extender: [name] [name] Signs in Treatment: 25 Information Obtained from: Patient Chief Complaint Left lower extremity wound Electronic Signature([name]) Signed: 07/13/2022 10:47:14 AM By: [name] Shan DO Entered By: [name] [name] on 07/13/2022 10:43:46 -------------------------------------------------------------------------------- Debridement Details Patient Name: Date of Service: [name] [name] [name] [name] 07/13/2022 10:00 [place] Record Number: [id number] Patient Account Number: [id number] Date of Birth/Sex: Treating RN: [birth year] (88 y.o. [name] [name] Primary Care Provider: Marda [name] Other Clinician: Referring Provider: Treating Provider/Extender: [name] [name] Signs in Treatment: 32 Debridement Performed for Assessment: Wound #26 Left,Lateral Ankle Performed By: Physician [name] Shan, DO Debridement Type: Debridement Severity of Tissue Pre Debridement: Fat layer exposed Level of Consciousness (Pre-procedure): Awake and Alert Pre-procedure Verification/Time Out Yes - 10:08 Taken: Start Time: 10:08 Pain Control: Lidocaine T Area Debrided (L x W): otal 1 (cm) x 0.7 (cm) = 0.7 (cm) Tissue and other material debrided: Viable, Non-Viable, Slough, Subcutaneous, Slough Level: Skin/Subcutaneous Tissue Debridement Description: Excisional Instrument: Curette Bleeding: Minimum Hemostasis Achieved: Pressure End Time: 10:08 Procedural Pain: 0 Post Procedural Pain: 0 Response to Treatment: Procedure was tolerated well Level of  Consciousness (Post- Awake and Alert procedure): Post Debridement Measurements of Total Wound Length: (cm) 1 Width: (cm) 0.7 Depth: (cm) 0.1 Volume: (cm) 0.055 Character of Wound/Ulcer Post Debridement: Improved Severity of Tissue Post Debridement: Fat layer exposed Post Procedure Diagnosis Same as Pre-procedure Electronic Signature([name]) Signed: 07/13/2022 10:47:14 AM By: [name] Shan DO Signed: 07/13/2022 3:41:21 PM By: [name] Hammock RN Entered By: [name] [name] on 07/13/2022 10:09:05 -------------------------------------------------------------------------------- HPI Details Patient Name: Date of Service: [name] [name] [name] [name] 07/13/2022 10:00 [place] Record Number: [id number] Patient Account Number: [id number] Date of Birth/Sex: Treating RN: Jun 14, 1925 (88 y.o. [name] [name] Primary Care Provider: Marda [name] Other Clinician: Referring Provider: Treating Provider/Extender: [name] [name] Signs in Treatment: 28 History of Present Illness HPI Description: this is a patient we know from several prior wounds on her bilateral lower extremities. She has venous stasis physiology, inflammation and hypertension. She is been fully evaluated for venous ablation and was found not to be a candidate. She does not have significant arterial disease. 02/14/15 the wound itself on her lateral left leg did not look too bad however there was surrounding maceration which was concerning 02/27/15 the wound itself is small with surrounding circumferential epithelialization there is no surrounding maceration. 03/06/15 only a very small area remains. I think this is mostly epithelialized however I would be uncomfortable not dressing this this week 03/13/15 the area is epithelialized. There was a thick surface on this. I took a #15 blade then lightly disrupted it but there does not appear to be any open area I did not see anything but appropriate  epithelium Readmission: 08/16/18 on evaluation today patient presents for initial evaluation and our clinic released readmission although she has not been seen since April 2016. Nonetheless she is having issues with the ulcers currently on the bilateral lateral malleolus or locations as well as the left lateral lower extremity. These have been present for roughly 3 weeks since the patient actually developed significant bilateral lower extremity edema which patient'[name] daughter states was roughly 3 times the size of her legs currently. Subsequently she  ended up in the emerging department due to congestive heart failure. They were able to get the swelling under control and fortunately she seems to be doing much better at this point in time. She was placed in an [employer] which she sat on for week part evaluation today as well. Fortunately the wound areas actually seem to be doing fairly well there is some macerated skin surrounding there are the areas/surface of the wound which are gonna require some debridement at this point. No fevers, chills, nausea, or vomiting noted at this time. Patient has a history of hypertension, mild peripheral vascular disease, chronic venous stasis, and other than the ulcer seems to be doing fairly well today. No fevers, chills, nausea, or vomiting noted at this time. 08/23/18 on evaluation today patient actually appears to be doing great in regard to her bilateral lower should be ulcers. She'[name] shown signs of improvement even just with one week with the [employer]. In general I'm actually very pleased with how things appear at this point. 09/06/18 on evaluation today patient actually appears to be doing much better her left lower extremity is completely healed. Go to the right lower extremity lateral malleolus ulcer this still seems to be showing signs of being open. There does not appear to be evidence of infection which is good news. This did require some sharp  debridement however to remove some of the necrotic tissue/eschar on the surface of the wound Readmission 11/27/2021 [name] [name] is a 88 year old female with a past medical history of endometrial and breast cancer, chronic venous insufficiency, dementia and essential hypertension that presents to the clinic with a 1 month history of nonhealing ulcer to the left lower extremity. She states that the ulcer opened 1 month ago and then healed but has reopened again in the past week. She is currently keeping the area covered. She has a history of wounds to her lower extremities. She has compression stockings that she uses daily. She currently denies signs of infection. 1/13; patient presents for follow-up. She followed up for nurse visit for wrap change. She reports more odor. She denies pain. 1/20; patient presents for follow-up. She has been using gentamicin ointment with calcium alginate to the wound bed. She reports improvement in odor and drainage. She currently denies systemic signs of infection. 1/27; patient presents for follow-up. She has been using gentamicin ointment with calcium alginate. She received her Keystone antibiotic 2 days ago. She brought this in. Home health has also been established. They will come out for the first time next week. She currently denies signs of infection. She has no issues or complaints today. 2/2; patient presents for follow-up. She has been using Keystone antibiotics with [facility] with no issues. She denies signs of infection. She has no issues or complaints today. 2/9; patient presents for follow-up. She has been using Keystone antibiotics with [facility] with no issues. She has tolerated the compression wrap well. She denies signs of infection. 2/16; patient presents for follow-up. She has been using Keystone antibiotics with Hydrofera Blue under compression wrap with no issues. She currently denies signs of infection. 2/23; patient presents  for follow-up. She continues to use Keystone antibiotics with Hydrofera Blue under the compression wrap. She has no issues or complaints today. She denies signs of infection. Home health continues to come out and change the wrap. 3/2; patient presents for follow-up. She continues to use [facility] with [facility] under compression with no issues. She denies signs  of infection. 3/16; patient presents for follow-up. She continues to use [facility] with [facility] under compression with no issues. Home health change the dressing last week. She denies signs of infection. 3/23; patient presents for follow-up. She continues to use Keystone antibiotics with Hydrofera Blue under compression with no issues. Home health continues to change the dressings. She denies signs of infection. 3/30; patient presents for follow-up. She continues to use Keystone antibiotics with Hydrofera Blue under compression with no issues. Home health continues to change the dressings. 4/13 2-week follow-up. Keystone and Hydrofera Blue wound is roughly 2.3 cm in diameter smaller. No other issues 4/20; patient presents for follow-up. She continues to use Keystone antibiotic with [facility] with no issues. 4/28; patient with a difficult wound on her right medial lower leg. Using [facility] Blue minimal improvement per our intake nurse we are using compression. She has home health changing the dressing 5/5; patient presents for follow-up. We have been using Keystone antibiotics and Hydrofera Blue under compression therapy. She has no issues or complaints today. We discussed potentially using an advanced tissue product. She states she has had this done in the past and would like to proceed to see what her insurance would cover. 5/15; patient presents for follow-up. She has been using Keystone antibiotics and Hydrofera Blue under compression therapy. She forgot her Keystone antibiotics today. Insurance has not  approved her for skin substitute. She currently denies signs of infection. She has home health that changes the dressing once a week. 5/19; patient presents for follow-up. We have been using Keystone antibiotic and Hydrofera Blue under compression therapy. She has no issues or complaints today. The [employer] rep confirmed over the phone that the patient has been 100% approved for their skin substitute. We will verify this as we have not received any paperwork stating this. Patient states she would like to proceed with a skin substitute if it is covered by insurance. 6/1; patient presents for follow-up. We have been using Keystone antibiotic and Hydrofera Blue under compression therapy. We confirmed that she does have a co-pay for the [employer] skin substitute. We will hold off on using this for now since she continues to show improvement with current therapy. She denies signs of infection. She has home health that comes out twice weekly. 6/16; patient presents for follow-up. We have been using Keystone antibiotic and Hydrofera Blue under compression therapy. She has no issues or complaints today. We discussed the potential trial of vendaje. This is placenta tissue. She states she would like to try the free trial. We will reach out to our rep. 6/30; patient presents for follow-up. She has been using Keystone antibiotic and Hydrofera Blue under compression therapy. She has been approved for a trial of vendaje. She would like to proceed with this and We will have this available at next clinic visit. She has no issues or complaints today. 7/18; patient presents for follow-up. She has been using Keystone antibiotic and Hydrofera Blue under compression. She has no issues or complaints today. We have a free sample of vendaje skin substitute and patient is agreeable to having this placed today. 7/24; patient presents for follow-up. We placed the first free trial of vendaje last week under compression wrap. Patient  had no issues or complaints today. She tolerated this well. 8/1; patient presents for follow-up. Vandaje #2 donated product was placed in standard fashion last week under compression therapy. She has no issues or complaints today. 8/8; patient presents for follow-up. Vendaje #  3 donated skin set was placed in standard fashion last visit under compression therapy. She has no issues or complaints today. 8/15; Patient presents for follow-up. Vandaje #4 was placed in standard fashion last week under compression therapy. She has no issues or complaints today. 8/22; patient presents for follow-up. Vandaje #5 was placed in standard fashion last week under compression therapy. She tolerated this well. Electronic Signature([name]) Signed: 07/13/2022 10:47:14 AM By: [name] Shan DO Entered By: [name] [name] on 07/13/2022 10:44:19 -------------------------------------------------------------------------------- Physical Exam Details Patient Name: Date of Service: [name] [name], [name] [name] [name] 07/13/2022 10:00 A M Medical Record Number: [id number] Patient Account Number: [id number] Date of Birth/Sex: Treating RN: [birth year] (88 y.o. [name] [name] Primary Care Provider: Marda [name] Other Clinician: Referring Provider: Treating Provider/Extender: Marjo Bicker Weeks in Treatment: 32 Constitutional respirations regular, non-labored and within target range for patient.. Cardiovascular 2+ dorsalis pedis/posterior tibialis pulses. Psychiatric pleasant and cooperative. Notes Left lower extremity: T the lateral aspect there is an open wound with granulation tissue and nonviable tissue present. No surrounding signs of infection. o Electronic Signature([name]) Signed: 07/13/2022 10:47:14 AM By: [name] Shan DO Entered By: [name] [name] on 07/13/2022 10:44:44 -------------------------------------------------------------------------------- Physician Orders Details Patient Name: Date  of Service: [name] [name], [name] [name] [name] 07/13/2022 10:00 A M Medical Record Number: [id number] Patient Account Number: [id number] Date of Birth/Sex: Treating RN: 10-09-1925 (88 y.o. [name] [name] Primary Care Provider: Marda [name] Other Clinician: Referring Provider: Treating Provider/Extender: [name] [name] Signs in Treatment: 54 Verbal / Phone Orders: No Diagnosis Coding Follow-up Appointments ppointment in 1 week. - 07/20/22 @ 10:00am w/ Dr. Heber [place] and Allayne Butcher Room # 9 Return A Cellular or Tissue Based Products Cellular or Tissue Based Product Type: - Vendaje #2 applied 06/14/2022 Vendaje #3 applied 06/22/2022 Vendaje # 4 applied 06/29/22 Discontinue Vendaje Vendaje # 5 applied 07/06/22 Bathing/ Shower/ Hygiene May shower with protection but do not get wound dressing([name]) wet. - use cast protector. May shower and wash wound with soap and water. - with dressing changes may wash with soap and water. Edema Control - Lymphedema / SCD / Other Elevate legs to the level of the heart or above for 30 minutes daily and/or when sitting, a frequency of: Avoid standing for long periods of time. Patient to wear own compression stockings every day. - apply in the morning and remove at night to right leg. Exercise regularly Moisturize legs daily. - every night before day right leg. Additional Orders / Instructions Follow Nutritious Diet - High Protein Diet Home Health New wound care orders this week; continue Home Health for wound care. May utilize formulary equivalent dressing for wound treatment orders unless otherwise specified. - No need for home health to change this week (week of 07/13/22), home health to go back out Friday 06/22/22 next week Other Home Health Orders/Instructions: - [facility] home health Wound Treatment Wound #26 - Ankle Wound Laterality: Left, Lateral Cleanser: Soap and Water ([place]) 2 x Per Week/30 Days Discharge Instructions: May shower and  wash wound with dial antibacterial soap and water prior to dressing change. Peri-Wound Care: Sween Lotion (Moisturizing lotion) (Home Health) 2 x Per Week/30 Days Discharge Instructions: Apply moisturizing lotion as directed Prim Dressing: Xeroform Occlusive Gauze Dressing, 4x4 in (Home Health) 2 x Per Week/30 Days ary Discharge Instructions: Apply to wound bed as instructed Secondary Dressing: Zetuvit Plus 4x8 in ([place]) 2 x Per Week/30 Days Discharge Instructions: Apply over primary dressing as directed. Compression Wrap: ThreePress (3 layer compression wrap) (Home Health) 2  x Per Week/30 Days Discharge Instructions: Apply three layer compression as directed. Electronic Signature([name]) Signed: 07/13/2022 10:47:14 AM By: [name] Shan DO Entered By: [name] [name] on 07/13/2022 10:44:56 -------------------------------------------------------------------------------- Problem List Details Patient Name: Date of Service: [name] [name], [name] [name] [name] 07/13/2022 10:00 A M Medical Record Number: [id number] Patient Account Number: [id number] Date of Birth/Sex: Treating RN: [birth year] (88 y.o. [name] [name] Primary Care Provider: Marda [name] Other Clinician: Referring Provider: Treating Provider/Extender: [name] [name] Signs in Treatment: 58 Active Problems ICD-10 Encounter Code Description Active Date MDM Diagnosis I87.312 Chronic venous hypertension (idiopathic) with ulcer of left lower extremity 11/27/2021 No Yes L97.822 Non-pressure chronic ulcer of other part of left lower leg with fat layer exposed1/04/2022 No Yes C50.919 Malignant neoplasm of unspecified site of unspecified female breast 11/27/2021 No Yes I50.42 Chronic combined systolic (congestive) and diastolic (congestive) heart failure 11/27/2021 No Yes G30.9 Alzheimer'[name] disease, unspecified 11/27/2021 No Yes C54.1 Malignant neoplasm of endometrium 11/27/2021 No Yes Inactive Problems Resolved  Problems Electronic Signature([name]) Signed: 07/13/2022 10:47:14 AM By: [name] Shan DO Entered By: [name] [name] on 07/13/2022 10:43:00 -------------------------------------------------------------------------------- Progress Note Details Patient Name: Date of Service: [name] [name], [name] [name] [name] 07/13/2022 10:00 A M Medical Record Number: [id number] Patient Account Number: [id number] Date of Birth/Sex: Treating RN: 01-Mar-1925 (88 y.o. [name] [name] Primary Care Provider: Marda [name] Other Clinician: Referring Provider: Treating Provider/Extender: [name] [name] Signs in Treatment: 73 Subjective Chief Complaint Information obtained from Patient Left lower extremity wound History of Present Illness (HPI) this is a patient we know from several prior wounds on her bilateral lower extremities. She has venous stasis physiology, inflammation and hypertension. She is been fully evaluated for venous ablation and was found not to be a candidate. She does not have significant arterial disease. 02/14/15 the wound itself on her lateral left leg did not look too bad however there was surrounding maceration which was concerning 02/27/15 the wound itself is small with surrounding circumferential epithelialization there is no surrounding maceration. 03/06/15 only a very small area remains. I think this is mostly epithelialized however I would be uncomfortable not dressing this this week 03/13/15 the area is epithelialized. There was a thick surface on this. I took a #15 blade then lightly disrupted it but there does not appear to be any open area I did not see anything but appropriate epithelium Readmission: 08/16/18 on evaluation today patient presents for initial evaluation and our clinic released readmission although she has not been seen since April 2016. Nonetheless she is having issues with the ulcers currently on the bilateral lateral malleolus or locations as well as the  left lateral lower extremity. These have been present for roughly 3 weeks since the patient actually developed significant bilateral lower extremity edema which patient'[name] daughter states was roughly 3 times the size of her legs currently. Subsequently she ended up in the emerging department due to congestive heart failure. They were able to get the swelling under control and fortunately she seems to be doing much better at this point in time. She was placed in an [employer] which she sat on for week part evaluation today as well. Fortunately the wound areas actually seem to be doing fairly well there is some macerated skin surrounding there are the areas/surface of the wound which are gonna require some debridement at this point. No fevers, chills, nausea, or vomiting noted at this time. Patient has a history of hypertension, mild peripheral vascular disease, chronic venous stasis, and other than the ulcer seems  to be doing fairly well today. No fevers, chills, nausea, or vomiting noted at this time. 08/23/18 on evaluation today patient actually appears to be doing great in regard to her bilateral lower should be ulcers. She'[name] shown signs of improvement even just with one week with the [employer]. In general I'm actually very pleased with how things appear at this point. 09/06/18 on evaluation today patient actually appears to be doing much better her left lower extremity is completely healed. Go to the right lower extremity lateral malleolus ulcer this still seems to be showing signs of being open. There does not appear to be evidence of infection which is good news. This did require some sharp debridement however to remove some of the necrotic tissue/eschar on the surface of the wound Readmission 11/27/2021 Ms. Sibley Rolison is a 88 year old female with a past medical history of endometrial and breast cancer, chronic venous insufficiency, dementia and essential hypertension that presents  to the clinic with a 1 month history of nonhealing ulcer to the left lower extremity. She states that the ulcer opened 1 month ago and then healed but has reopened again in the past week. She is currently keeping the area covered. She has a history of wounds to her lower extremities. She has compression stockings that she uses daily. She currently denies signs of infection. 1/13; patient presents for follow-up. She followed up for nurse visit for wrap change. She reports more odor. She denies pain. 1/20; patient presents for follow-up. She has been using gentamicin ointment with calcium alginate to the wound bed. She reports improvement in odor and drainage. She currently denies systemic signs of infection. 1/27; patient presents for follow-up. She has been using gentamicin ointment with calcium alginate. She received her Keystone antibiotic 2 days ago. She brought this in. Home health has also been established. They will come out for the first time next week. She currently denies signs of infection. She has no issues or complaints today. 2/2; patient presents for follow-up. She has been using Keystone antibiotics with [facility] with no issues. She denies signs of infection. She has no issues or complaints today. 2/9; patient presents for follow-up. She has been using Keystone antibiotics with [facility] with no issues. She has tolerated the compression wrap well. She denies signs of infection. 2/16; patient presents for follow-up. She has been using Keystone antibiotics with Hydrofera Blue under compression wrap with no issues. She currently denies signs of infection. 2/23; patient presents for follow-up. She continues to use Keystone antibiotics with Hydrofera Blue under the compression wrap. She has no issues or complaints today. She denies signs of infection. Home health continues to come out and change the wrap. 3/2; patient presents for follow-up. She continues to use [facility] with  [facility] under compression with no issues. She denies signs of infection. 3/16; patient presents for follow-up. She continues to use [facility] with [facility] under compression with no issues. Home health change the dressing last week. She denies signs of infection. 3/23; patient presents for follow-up. She continues to use Keystone antibiotics with Hydrofera Blue under compression with no issues. Home health continues to change the dressings. She denies signs of infection. 3/30; patient presents for follow-up. She continues to use Keystone antibiotics with Hydrofera Blue under compression with no issues. Home health continues to change the dressings. 4/13 2-week follow-up. Keystone and Hydrofera Blue wound is roughly 2.3 cm in diameter smaller. No other issues 4/20; patient presents for follow-up. She continues to  use Keystone antibiotic with [facility] with no issues. 4/28; patient with a difficult wound on her right medial lower leg. Using [facility] Blue minimal improvement per our intake nurse we are using compression. She has home health changing the dressing 5/5; patient presents for follow-up. We have been using Keystone antibiotics and Hydrofera Blue under compression therapy. She has no issues or complaints today. We discussed potentially using an advanced tissue product. She states she has had this done in the past and would like to proceed to see what her insurance would cover. 5/15; patient presents for follow-up. She has been using Keystone antibiotics and Hydrofera Blue under compression therapy. She forgot her Keystone antibiotics today. Insurance has not approved her for skin substitute. She currently denies signs of infection. She has home health that changes the dressing once a week. 5/19; patient presents for follow-up. We have been using Keystone antibiotic and Hydrofera Blue under compression therapy. She has no issues or complaints today. The [employer]  rep confirmed over the phone that the patient has been 100% approved for their skin substitute. We will verify this as we have not received any paperwork stating this. Patient states she would like to proceed with a skin substitute if it is covered by insurance. 6/1; patient presents for follow-up. We have been using Keystone antibiotic and Hydrofera Blue under compression therapy. We confirmed that she does have a co-pay for the [employer] skin substitute. We will hold off on using this for now since she continues to show improvement with current therapy. She denies signs of infection. She has home health that comes out twice weekly. 6/16; patient presents for follow-up. We have been using Keystone antibiotic and Hydrofera Blue under compression therapy. She has no issues or complaints today. We discussed the potential trial of vendaje. This is placenta tissue. She states she would like to try the free trial. We will reach out to our rep. 6/30; patient presents for follow-up. She has been using Keystone antibiotic and Hydrofera Blue under compression therapy. She has been approved for a trial of vendaje. She would like to proceed with this and We will have this available at next clinic visit. She has no issues or complaints today. 7/18; patient presents for follow-up. She has been using Keystone antibiotic and Hydrofera Blue under compression. She has no issues or complaints today. We have a free sample of vendaje skin substitute and patient is agreeable to having this placed today. 7/24; patient presents for follow-up. We placed the first free trial of vendaje last week under compression wrap. Patient had no issues or complaints today. She tolerated this well. 8/1; patient presents for follow-up. Vandaje #2 donated product was placed in standard fashion last week under compression therapy. She has no issues or complaints today. 8/8; patient presents for follow-up. Vendaje #3 donated skin set was placed  in standard fashion last visit under compression therapy. She has no issues or complaints today. 8/15; Patient presents for follow-up. Vandaje #4 was placed in standard fashion last week under compression therapy. She has no issues or complaints today. 8/22; patient presents for follow-up. Vandaje #5 was placed in standard fashion last week under compression therapy. She tolerated this well. Patient History Information obtained from Patient. Family History Cancer - Child, Hypertension - Child,Father, Kidney Disease - Child, Stroke - Siblings,Child, Thyroid Problems - Child, No family history of Diabetes, Heart Disease, Hereditary Spherocytosis, Lung Disease, Seizures, Tuberculosis. Social History Never smoker, Marital Status - Widowed, Alcohol Use -  Never, Drug Use - No History, Caffeine Use - Never. Medical History Eyes Denies history of Cataracts, Glaucoma, Optic Neuritis Ear/Nose/Mouth/Throat Denies history of Chronic sinus problems/congestion, Middle ear problems Hematologic/Lymphatic Patient has history of Anemia Denies history of Hemophilia, Human Immunodeficiency Virus, Lymphedema, Sickle Cell Disease Respiratory Denies history of Aspiration, Asthma, Chronic Obstructive Pulmonary Disease (COPD), Pneumothorax, Sleep Apnea, Tuberculosis Cardiovascular Patient has history of Congestive Heart Failure, Hypertension, Peripheral Venous Disease Denies history of Angina, Arrhythmia, Coronary Artery Disease, Hypotension, Myocardial Infarction, Peripheral Arterial Disease, Phlebitis, Vasculitis Gastrointestinal Denies history of Cirrhosis , Colitis, Crohnoos, Hepatitis A, Hepatitis B, Hepatitis C Endocrine Denies history of Type I Diabetes, Type II Diabetes Genitourinary Denies history of End Stage Renal Disease Immunological Denies history of Lupus Erythematosus, Raynaudoos, Scleroderma Integumentary (Skin) Denies history of History of Burn Musculoskeletal Patient has history of  Osteoarthritis Denies history of Gout, Rheumatoid Arthritis, Osteomyelitis Neurologic Denies history of Neuropathy, Quadriplegia, Paraplegia, Seizure Disorder Oncologic Patient has history of Received Chemotherapy - Breast and Ovarian Cancer Denies history of Received Radiation Hospitalization/Surgery History - diverticulits. Objective Constitutional respirations regular, non-labored and within target range for patient.. Vitals Time Taken: 9:57 AM, Height: 66 in, Weight: 176 lbs, BMI: 28.4, Temperature: 97.7 F, Pulse: 74 bpm, Respiratory Rate: 17 breaths/min, Blood Pressure: 134/77 mmHg. Cardiovascular 2+ dorsalis pedis/posterior tibialis pulses. Psychiatric pleasant and cooperative. General Notes: Left lower extremity: T the lateral aspect there is an open wound with granulation tissue and nonviable tissue present. No surrounding signs of o infection. Integumentary (Hair, Skin) Wound #26 status is Open. Original cause of wound was Gradually Appeared. The date acquired was: 11/20/2021. The wound has been in treatment 32 weeks. The wound is located on the Left,Lateral Ankle. The wound measures 1cm length x 0.7cm width x 0.1cm depth; 0.55cm^2 area and 0.055cm^3 volume. There is Fat Layer (Subcutaneous Tissue) exposed. There is no tunneling or undermining noted. There is a medium amount of serosanguineous drainage noted. The wound margin is distinct with the outline attached to the wound base. There is large (67-100%) red granulation within the wound bed. There is a small (1-33%) amount of necrotic tissue within the wound bed including Adherent Slough. Assessment Active Problems ICD-10 Chronic venous hypertension (idiopathic) with ulcer of left lower extremity Non-pressure chronic ulcer of other part of left lower leg with fat layer exposed Malignant neoplasm of unspecified site of unspecified female breast Chronic combined systolic (congestive) and diastolic (congestive) heart  failure Alzheimer'[name] disease, unspecified Malignant neoplasm of endometrium Patient'[name] wound has shown improvement in size appearance since last clinic visit. I debrided nonviable tissue. At this time I think she would benefit from changing the dressing to Xeroform as she is almost healed. We will continue compression therapy. Information to order compression stockings were given T o the patient today. She would benefit from 20/30 mmHg to her legs bilaterally. Follow-up in 1 week. Procedures Wound #26 Pre-procedure diagnosis of Wound #26 is a Venous Leg Ulcer located on the Left,Lateral Ankle .Severity of Tissue Pre Debridement is: Fat layer exposed. There was a Excisional Skin/Subcutaneous Tissue Debridement with a total area of 0.7 sq cm performed by [name] Shan, DO. With the following instrument([name]): Curette to remove Viable and Non-Viable tissue/material. Material removed includes Subcutaneous Tissue and Slough and after achieving pain control using Lidocaine. No specimens were taken. A time out was conducted at 10:08, prior to the start of the procedure. A Minimum amount of bleeding was controlled with Pressure. The procedure was tolerated well with a pain level of  0 throughout and a pain level of 0 following the procedure. Post Debridement Measurements: 1cm length x 0.7cm width x 0.1cm depth; 0.055cm^3 volume. Character of Wound/Ulcer Post Debridement is improved. Severity of Tissue Post Debridement is: Fat layer exposed. Post procedure Diagnosis Wound #26: Same as Pre-Procedure Pre-procedure diagnosis of Wound #26 is a Venous Leg Ulcer located on the Left,Lateral Ankle . There was a Three Layer Compression Therapy Procedure by [name] Hammock, RN. Post procedure Diagnosis Wound #26: Same as Pre-Procedure Plan Follow-up Appointments: Return Appointment in 1 week. - 07/20/22 @ 10:00am w/ Dr. Heber [place] and Allayne Butcher Room # 9 Cellular or Tissue Based Products: Cellular or Tissue Based  Product Type: - Vendaje #2 applied 06/14/2022 Vendaje #3 applied 06/22/2022 Vendaje # 4 applied 06/29/22 Discontinue Vendaje Vendaje # 5 applied 07/06/22 Bathing/ Shower/ Hygiene: May shower with protection but do not get wound dressing([name]) wet. - use cast protector. May shower and wash wound with soap and water. - with dressing changes may wash with soap and water. Edema Control - Lymphedema / SCD / Other: Elevate legs to the level of the heart or above for 30 minutes daily and/or when sitting, a frequency of: Avoid standing for long periods of time. Patient to wear own compression stockings every day. - apply in the morning and remove at night to right leg. Exercise regularly Moisturize legs daily. - every night before day right leg. Additional Orders / Instructions: Follow Nutritious Diet - High Protein Diet Home Health: New wound care orders this week; continue Home Health for wound care. May utilize formulary equivalent dressing for wound treatment orders unless otherwise specified. - No need for home health to change this week (week of 07/13/22), home health to go back out Friday 06/22/22 next week Other Home Health Orders/Instructions: [facility] home health WOUND #26: - Ankle Wound Laterality: Left, Lateral Cleanser: Soap and Water ([place]) 2 x Per Week/30 Days Discharge Instructions: May shower and wash wound with dial antibacterial soap and water prior to dressing change. Peri-Wound Care: Sween Lotion (Moisturizing lotion) (Home Health) 2 x Per Week/30 Days Discharge Instructions: Apply moisturizing lotion as directed Prim Dressing: Xeroform Occlusive Gauze Dressing, 4x4 in (Home Health) 2 x Per Week/30 Days ary Discharge Instructions: Apply to wound bed as instructed Secondary Dressing: Zetuvit Plus 4x8 in ([place]) 2 x Per Week/30 Days Discharge Instructions: Apply over primary dressing as directed. Com pression Wrap: ThreePress (3 layer compression wrap) (Home Health) 2  x Per Week/30 Days Discharge Instructions: Apply three layer compression as directed. 1. In office sharp debridement 2. Xeroform under 3 layer compression 3. Order compression stockings 4. Follow-up in 1 week Electronic Signature([name]) Signed: 07/13/2022 10:47:14 AM By: [name] Shan DO Entered By: [name] [name] on 07/13/2022 10:46:20 -------------------------------------------------------------------------------- HxROS Details Patient Name: Date of Service: [name] [name], [name] [name] [name] 07/13/2022 10:00 A M Medical Record Number: [id number] Patient Account Number: [id number] Date of Birth/Sex: Treating RN: 1925/05/27 (88 y.o. [name] [name] Primary Care Provider: Marda [name] Other Clinician: Referring Provider: Treating Provider/Extender: [name] [name] Signs in Treatment: 69 Information Obtained From Patient Eyes Medical History: Negative for: Cataracts; Glaucoma; Optic Neuritis Ear/Nose/Mouth/Throat Medical History: Negative for: Chronic sinus problems/congestion; Middle ear problems Hematologic/Lymphatic Medical History: Positive for: Anemia Negative for: Hemophilia; Human Immunodeficiency Virus; Lymphedema; Sickle Cell Disease Respiratory Medical History: Negative for: Aspiration; Asthma; Chronic Obstructive Pulmonary Disease (COPD); Pneumothorax; Sleep Apnea; Tuberculosis Cardiovascular Medical History: Positive for: Congestive Heart Failure; Hypertension; Peripheral Venous Disease Negative for: Angina; Arrhythmia; Coronary Artery Disease; Hypotension;  Myocardial Infarction; Peripheral Arterial Disease; Phlebitis; Vasculitis Gastrointestinal Medical History: Negative for: Cirrhosis ; Colitis; Crohns; Hepatitis A; Hepatitis B; Hepatitis C Endocrine Medical History: Negative for: Type I Diabetes; Type II Diabetes Genitourinary Medical History: Negative for: End Stage Renal Disease Immunological Medical History: Negative for: Lupus  Erythematosus; Raynauds; Scleroderma Integumentary (Skin) Medical History: Negative for: History of Burn Musculoskeletal Medical History: Positive for: Osteoarthritis Negative for: Gout; Rheumatoid Arthritis; Osteomyelitis Neurologic Medical History: Negative for: Neuropathy; Quadriplegia; Paraplegia; Seizure Disorder Oncologic Medical History: Positive for: Received Chemotherapy - Breast and Ovarian Cancer Negative for: Received Radiation Immunizations Pneumococcal Vaccine: Received Pneumococcal Vaccination: Yes Received Pneumococcal Vaccination On or After 60th Birthday: Yes Implantable Devices None Hospitalization / Surgery History Type of Hospitalization/Surgery diverticulits Family and Social History Cancer: Yes - Child; Diabetes: No; Heart Disease: No; Hereditary Spherocytosis: No; Hypertension: Yes - Child,Father; Kidney Disease: Yes - Child; Lung Disease: No; Seizures: No; Stroke: Yes - Siblings,Child; Thyroid Problems: Yes - Child; Tuberculosis: No; Never smoker; Marital Status - Widowed; Alcohol Use: Never; Drug Use: No History; Caffeine Use: Never; Financial Concerns: No; Food, Clothing or Shelter Needs: No; Support System Lacking: No; Transportation Concerns: No Electronic Signature([name]) Signed: 07/13/2022 10:47:14 AM By: [name] Shan DO Signed: 07/13/2022 3:41:21 PM By: [name] Hammock RN Entered By: [name] [name] on 07/13/2022 10:44:27 -------------------------------------------------------------------------------- SuperBill Details Patient Name: Date of Service: [name] [name], [name] [name] [name] 07/13/2022 Medical Record Number: [id number] Patient Account Number: [id number] Date of Birth/Sex: Treating RN: [birth year] (88 y.o. [name] [name] Primary Care Provider: Marda [name] Other Clinician: Referring Provider: Treating Provider/Extender: [name] [name] Signs in Treatment: 32 Diagnosis Coding ICD-10 Codes Code Description [phone]  Chronic venous hypertension (idiopathic) with ulcer of left lower extremity L97.822 Non-pressure chronic ulcer of other part of left lower leg with fat layer exposed C50.919 Malignant neoplasm of unspecified site of unspecified female breast I50.42 Chronic combined systolic (congestive) and diastolic (congestive) heart failure G30.9 Alzheimer'[name] disease, unspecified C54.1 Malignant neoplasm of endometrium Facility Procedures CPT4 Code: [id number] Description: [phone] - WOUND CARE VISIT-LEV 1 EST PT Modifier: Quantity: 1 CPT4 Code: [id number] Description: [id number] - DEB SUBQ TISSUE 20 SQ CM/< ICD-10 Diagnosis Description L97.822 Non-pressure chronic ulcer of other part of left lower leg with fat layer expos Modifier: ed Quantity: 1 Physician Procedures : CPT4 Code Description Modifier [id number] - WC PHYS SUBQ TISS 20 SQ CM ICD-10 Diagnosis Description L97.822 Non-pressure chronic ulcer of other part of left lower leg with fat layer exposed Quantity: 1 Electronic Signature([name]) Signed: 07/13/2022 10:47:14 AM By: [name] Shan DO Entered By: [name] [name] on 07/13/2022 10:46:31

## 2022-07-13 NOTE — Progress Notes (Signed)
[name], [name] ([id number]) Visit Report for 07/13/2022 Arrival Information Details Patient Name: Date of Service: [name], [name] 07/13/2022 10:00 A M Medical Record Number: [id number] Patient Account Number: [id number] Date of Birth/Sex: Treating [name]: 1925/05/05 (88 y.o. [name], [name] Primary Care Makinze Jani: Marda Stalker Other Clinician: Referring Taelor Moncada: Treating Jenny Lai/Extender: Aviva Signs in Treatment: 2 Visit Information History Since Last Visit Added or deleted any medications: No Patient Arrived: Wheel Chair Any new allergies or adverse reactions: No Arrival Time: 09:56 Had a fall or experienced change in No Accompanied By: daughter activities of daily living that may affect Transfer Assistance: Manual risk of falls: Patient Identification Verified: Yes Signs or symptoms of abuse/neglect since last visito No Secondary Verification Process Completed: Yes Hospitalized since last visit: No Patient Requires Transmission-Based Precautions: No Implantable device outside of the clinic excluding No Patient Has Alerts: No cellular tissue based products placed in the center since last visit: Has Dressing in Place as Prescribed: Yes Has Compression in Place as Prescribed: Yes Pain Present Now: No Electronic Signature(s) Signed: 07/13/2022 3:41:21 PM By: Rhae Hammock [name] Entered By: Rhae Hammock on 07/13/2022 09:57:13 -------------------------------------------------------------------------------- Clinic Level of Care Assessment Details Patient Name: Date of Service: KANESHIA, CATER [name] 07/13/2022 10:00 A M Medical Record Number: [id number] Patient Account Number: [id number] Date of Birth/Sex: Treating [name]: 12/30/1924 (88 y.o. [name], [name] Primary Care Alese Furniss: Marda Stalker Other Clinician: Referring Teigan Sahli: Treating Alfard Cochrane/Extender: Aviva Signs in Treatment: 32 Clinic Level  of Care Assessment Items TOOL 4 Quantity Score X- 1 0 Use when only an EandM is performed on FOLLOW-UP visit ASSESSMENTS - Nursing Assessment / Reassessment X- 1 10 Reassessment of Co-morbidities (includes updates in patient status) X- 1 5 Reassessment of Adherence to Treatment Plan ASSESSMENTS - Wound and Skin A ssessment / Reassessment '[]'$  - 0 Simple Wound Assessment / Reassessment - one wound '[]'$  - 0 Complex Wound Assessment / Reassessment - multiple wounds '[]'$  - 0 Dermatologic / Skin Assessment (not related to wound area) ASSESSMENTS - Focused Assessment X- 1 5 Circumferential Edema Measurements - multi extremities '[]'$  - 0 Nutritional Assessment / Counseling / Intervention '[]'$  - 0 Lower Extremity Assessment (monofilament, tuning fork, pulses) '[]'$  - 0 Peripheral Arterial Disease Assessment (using hand held doppler) ASSESSMENTS - Ostomy and/or Continence Assessment and Care '[]'$  - 0 Incontinence Assessment and Management '[]'$  - 0 Ostomy Care Assessment and Management (repouching, etc.) PROCESS - Coordination of Care '[]'$  - 0 Simple Patient / Family Education for ongoing care '[]'$  - 0 Complex (extensive) Patient / Family Education for ongoing care '[]'$  - 0 Staff obtains Programmer, systems, Records, T Results / Process Orders est '[]'$  - 0 Staff telephones HHA, Nursing Homes / Clarify orders / etc '[]'$  - 0 Routine Transfer to another Facility (non-emergent condition) '[]'$  - 0 Routine Hospital Admission (non-emergent condition) '[]'$  - 0 New Admissions / Biomedical engineer / Ordering NPWT Apligraf, etc. , '[]'$  - 0 Emergency Hospital Admission (emergent condition) X- 1 10 Simple Discharge Coordination '[]'$  - 0 Complex (extensive) Discharge Coordination PROCESS - Special Needs '[]'$  - 0 Pediatric / Minor Patient Management '[]'$  - 0 Isolation Patient Management '[]'$  - 0 Hearing / Language / Visual special needs '[]'$  - 0 Assessment of Community assistance (transportation, D/C planning, etc.) '[]'$  -  0 Additional assistance / Altered mentation '[]'$  - 0 Support Surface(s) Assessment (bed, cushion, seat, etc.) INTERVENTIONS - Wound Cleansing / Measurement '[]'$  - 0 Simple Wound Cleansing - one wound '[]'$  - 0 Complex Wound Cleansing -  multiple wounds '[]'$  - 0 Wound Imaging (photographs - any number of wounds) '[]'$  - 0 Wound Tracing (instead of photographs) '[]'$  - 0 Simple Wound Measurement - one wound '[]'$  - 0 Complex Wound Measurement - multiple wounds INTERVENTIONS - Wound Dressings '[]'$  - 0 Small Wound Dressing one or multiple wounds '[]'$  - 0 Medium Wound Dressing one or multiple wounds '[]'$  - 0 Large Wound Dressing one or multiple wounds '[]'$  - 0 Application of Medications - topical '[]'$  - 0 Application of Medications - injection INTERVENTIONS - Miscellaneous '[]'$  - 0 External ear exam '[]'$  - 0 Specimen Collection (cultures, biopsies, blood, body fluids, etc.) '[]'$  - 0 Specimen(s) / Culture(s) sent or taken to Lab for analysis '[]'$  - 0 Patient Transfer (multiple staff / Civil Service fast streamer / Similar devices) '[]'$  - 0 Simple Staple / Suture removal (25 or less) '[]'$  - 0 Complex Staple / Suture removal (26 or more) '[]'$  - 0 Hypo / Hyperglycemic Management (close monitor of Blood Glucose) '[]'$  - 0 Ankle / Brachial Index (ABI) - do not check if billed separately X- 1 5 Vital Signs Has the patient been seen at the hospital within the last three years: Yes Total Score: 35 Level Of Care: New/Established - Level 1 Electronic Signature(s) Signed: 07/13/2022 3:41:21 PM By: Rhae Hammock [name] Entered By: Rhae Hammock on 07/13/2022 10:25:53 -------------------------------------------------------------------------------- Compression Therapy Details Patient Name: Date of Service: [name], [name] [name] 07/13/2022 10:00 A M Medical Record Number: [id number] Patient Account Number: [id number] Date of Birth/Sex: Treating [name]: 1925-03-27 (88 y.o. [name], [name] Primary Care Zein Helbing: Marda Stalker Other  Clinician: Referring Derrick Orris: Treating Johany Hansman/Extender: Aviva Signs in Treatment: 32 Compression Therapy Performed for Wound Assessment: Wound #26 Left,Lateral Ankle Performed By: Clinician Rhae Hammock, [name] Compression Type: Three Layer Post Procedure Diagnosis Same as Pre-procedure Electronic Signature(s) Signed: 07/13/2022 3:41:21 PM By: Rhae Hammock [name] Entered By: Rhae Hammock on 07/13/2022 10:09:21 -------------------------------------------------------------------------------- Encounter Discharge Information Details Patient Name: Date of Service: [name], [name] [name] 07/13/2022 10:00 [place] Record Number: [id number] Patient Account Number: [id number] Date of Birth/Sex: Treating [name]: [birth year] (88 y.o. [name], [name] Primary Care Angell Pincock: Marda Stalker Other Clinician: Referring Shaylee Stanislawski: Treating Antia Rahal/Extender: Aviva Signs in Treatment: 23 Encounter Discharge Information Items Post Procedure Vitals Discharge Condition: Stable Temperature (F): 98.4 Ambulatory Status: Wheelchair Pulse (bpm): 75 Discharge Destination: Home Respiratory Rate (breaths/min): 18 Transportation: Private Auto Blood Pressure (mmHg): 128/79 Accompanied By: daughter Schedule Follow-up Appointment: Yes Clinical Summary of Care: Patient Declined Electronic Signature(s) Signed: 07/13/2022 3:41:21 PM By: Rhae Hammock [name] Entered By: Rhae Hammock on 07/13/2022 10:13:48 -------------------------------------------------------------------------------- Lower Extremity Assessment Details Patient Name: Date of Service: [name], [name] [name] 07/13/2022 10:00 [place] Record Number: [id number] Patient Account Number: [id number] Date of Birth/Sex: Treating [name]: [birth year] (88 y.o. [name], [name] Primary Care Cordaryl Decelles: Marda Stalker Other Clinician: Referring Delbert Darley: Treating Rhondalyn Clingan/Extender:  Marjo Bicker Weeks in Treatment: 32 Edema Assessment Assessed: [name]: Yes] Patrice Paradise: No] Edema: [Left: N] [Right: o] Calf Left: Right: Point of Measurement: 30 cm From Medial Instep 31 cm 33 cm Ankle Left: Right: Point of Measurement: 9 cm From Medial Instep 22.5 cm 21 cm Knee To Floor Left: Right: From Medial Instep 39 cm 39 cm Vascular Assessment Pulses: Dorsalis Pedis Palpable: [Left:Yes] [Right:Yes] Posterior Tibial Palpable: [Left:Yes] [Right:Yes] Blood Pressure: Brachial: [Left:134] [Right:134] Ankle: [Left:Dorsalis Pedis: 140 1.04] [Right:Dorsalis Pedis: 170 1.27] Electronic Signature(s) Signed: 07/13/2022 3:41:21 PM By: Rhae Hammock [name] Entered By: Rhae Hammock on 07/13/2022 10:25:11 --------------------------------------------------------------------------------  Multi Wound Chart Details Patient Name: Date of Service: [name], [name] 07/13/2022 10:00 A M Medical Record Number: [id number] Patient Account Number: [id number] Date of Birth/Sex: Treating [name]: [birth year] (88 y.o. [name], [name] Primary Care Mavin Dyke: Marda Stalker Other Clinician: Referring Marquavius Scaife: Treating Vicki Pasqual/Extender: Aviva Signs in Treatment: 32 Vital Signs Height(in): 61 Pulse(bpm): 37 Weight(lbs): 176 Blood Pressure(mmHg): 134/77 Body Mass Index(BMI): 28.4 Temperature(F): 97.7 Respiratory Rate(breaths/min): 17 Photos: [N/A:N/A] Left, Lateral Ankle N/A N/A Wound Location: Gradually Appeared N/A N/A Wounding Event: Venous Leg Ulcer N/A N/A Primary Etiology: Anemia, Congestive Heart Failure, N/A N/A Comorbid History: Hypertension, Peripheral Venous Disease, Osteoarthritis, Received Chemotherapy 11/20/2021 N/A N/A Date Acquired: 15 N/A N/A Weeks of Treatment: Open N/A N/A Wound Status: No N/A N/A Wound Recurrence: 1x0.7x0.1 N/A N/A Measurements L x W x D (cm) 0.55 N/A N/A A (cm) : rea 0.055 N/A  N/A Volume (cm) : 94.20% N/A N/A % Reduction in A rea: 94.20% N/A N/A % Reduction in Volume: Full Thickness Without Exposed N/A N/A Classification: Support Structures Medium N/A N/A Exudate A mount: Serosanguineous N/A N/A Exudate Type: red, brown N/A N/A Exudate Color: Distinct, outline attached N/A N/A Wound Margin: Large (67-100%) N/A N/A Granulation A mount: Red N/A N/A Granulation Quality: Small (1-33%) N/A N/A Necrotic A mount: Fat Layer (Subcutaneous Tissue): Yes N/A N/A Exposed Structures: Fascia: No Tendon: No Muscle: No Joint: No Bone: No Medium (34-66%) N/A N/A Epithelialization: Debridement - Excisional N/A N/A Debridement: Pre-procedure Verification/Time Out 10:08 N/A N/A Taken: Lidocaine N/A N/A Pain Control: Subcutaneous, Slough N/A N/A Tissue Debrided: Skin/Subcutaneous Tissue N/A N/A Level: 0.7 N/A N/A Debridement A (sq cm): rea Curette N/A N/A Instrument: Minimum N/A N/A Bleeding: Pressure N/A N/A Hemostasis A chieved: 0 N/A N/A Procedural Pain: 0 N/A N/A Post Procedural Pain: Procedure was tolerated well N/A N/A Debridement Treatment Response: 1x0.7x0.1 N/A N/A Post Debridement Measurements L x W x D (cm) 0.055 N/A N/A Post Debridement Volume: (cm) Compression Therapy N/A N/A Procedures Performed: Debridement Treatment Notes Wound #26 (Ankle) Wound Laterality: Left, Lateral Cleanser Soap and Water Discharge Instruction: May shower and wash wound with dial antibacterial soap and water prior to dressing change. Peri-Wound Care Sween Lotion (Moisturizing lotion) Discharge Instruction: Apply moisturizing lotion as directed Topical Primary Dressing Xeroform Occlusive Gauze Dressing, 4x4 in Discharge Instruction: Apply to wound bed as instructed Secondary Dressing Zetuvit Plus 4x8 in Discharge Instruction: Apply over primary dressing as directed. Secured With Compression Wrap ThreePress (3 layer compression  wrap) Discharge Instruction: Apply three layer compression as directed. Compression Stockings Add-Ons Electronic Signature(s) Signed: 07/13/2022 10:47:14 AM By: Kalman Shan DO Signed: 07/13/2022 3:41:21 PM By: Rhae Hammock [name] Entered By: Kalman Shan on 07/13/2022 10:43:06 -------------------------------------------------------------------------------- Multi-Disciplinary Care Plan Details Patient Name: Date of Service: [name], [name] [name] 07/13/2022 10:00 A M Medical Record Number: [id number] Patient Account Number: [id number] Date of Birth/Sex: Treating [name]: Dec 04, 1924 (88 y.o. [name], [name] Primary Care Haseeb Fiallos: Marda Stalker Other Clinician: Referring Drew Herman: Treating Chun Sellen/Extender: Aviva Signs in Treatment: 50 Active Inactive Nutrition Nursing Diagnoses: Imbalanced nutrition Goals: Patient/caregiver agrees to and verbalizes understanding of need to use nutritional supplements and/or vitamins as prescribed Date Initiated: 11/27/2021 Target Resolution Date: 07/23/2022 Goal Status: Active Interventions: Assess patient nutrition upon admission and as needed per policy Provide education on nutrition Treatment Activities: Education provided on Nutrition : 07/06/2022 Notes: 06/22/2022 #3 vendaje advance tissue product applied by product, compression continued. Electronic Signature(s) Signed: 07/13/2022 3:41:21 PM By: Rhae Hammock [name] Entered By:  Rhae Hammock on 07/13/2022 10:12:30 -------------------------------------------------------------------------------- Pain Assessment Details Patient Name: Date of Service: [name], [name] 07/13/2022 10:00 A M Medical Record Number: [id number] Patient Account Number: [id number] Date of Birth/Sex: Treating [name]: October 27, 1925 (88 y.o. [name], [name] Primary Care Tyjay Galindo: Marda Stalker Other Clinician: Referring Meldrick Buttery: Treating Jeimy Bickert/Extender: Aviva Signs in Treatment: 29 Active Problems Location of Pain Severity and Description of Pain Patient Has Paino No Site Locations Pain Management and Medication Current Pain Management: Electronic Signature(s) Signed: 07/13/2022 3:41:21 PM By: Rhae Hammock [name] Entered By: Rhae Hammock on 07/13/2022 09:57:36 -------------------------------------------------------------------------------- Patient/Caregiver Education Details Patient Name: Date of Service: [name] [name] 8/22/2023andnbsp10:00 A M Medical Record Number: [id number] Patient Account Number: [id number] Date of Birth/Gender: Treating [name]: 1925/11/08 (88 y.o. [name], [name] Primary Care Physician: Marda Stalker Other Clinician: Referring Physician: Treating Physician/Extender: Aviva Signs in Treatment: 83 Education Assessment Education Provided To: Patient Education Topics Provided Nutrition: Methods: Explain/Verbal Responses: Reinforcements needed, State content correctly Electronic Signature(s) Signed: 07/13/2022 3:41:21 PM By: Rhae Hammock [name] Entered By: Rhae Hammock on 07/13/2022 10:12:42 -------------------------------------------------------------------------------- Wound Assessment Details Patient Name: Date of Service: [name], [name] [name] 07/13/2022 10:00 [place] Record Number: [id number] Patient Account Number: [id number] Date of Birth/Sex: Treating [name]: 05-Aug-1925 (88 y.o. [name], [name] Primary Care Ariyon Mittleman: Marda Stalker Other Clinician: Referring Kaylise Blakeley: Treating Yasaman Kolek/Extender: Marjo Bicker Weeks in Treatment: 32 Wound Status Wound Number: 26 Primary Venous Leg Ulcer Etiology: Wound Location: Left, Lateral Ankle Wound Open Wounding Event: Gradually Appeared Status: Date Acquired: 11/20/2021 Comorbid Anemia, Congestive Heart Failure, Hypertension, Peripheral Weeks Of  Treatment: 32 History: Venous Disease, Osteoarthritis, Received Chemotherapy Clustered Wound: No Photos Wound Measurements Length: (cm) 1 Width: (cm) 0.7 Depth: (cm) 0.1 Area: (cm) 0.55 Volume: (cm) 0.055 % Reduction in Area: 94.2% % Reduction in Volume: 94.2% Epithelialization: Medium (34-66%) Tunneling: No Undermining: No Wound Description Classification: Full Thickness Without Exposed Support Structures Wound Margin: Distinct, outline attached Exudate Amount: Medium Exudate Type: Serosanguineous Exudate Color: red, brown Foul Odor After Cleansing: No Slough/Fibrino Yes Wound Bed Granulation Amount: Large (67-100%) Exposed Structure Granulation Quality: Red Fascia Exposed: No Necrotic Amount: Small (1-33%) Fat Layer (Subcutaneous Tissue) Exposed: Yes Necrotic Quality: Adherent Slough Tendon Exposed: No Muscle Exposed: No Joint Exposed: No Bone Exposed: No Treatment Notes Wound #26 (Ankle) Wound Laterality: Left, Lateral Cleanser Soap and Water Discharge Instruction: May shower and wash wound with dial antibacterial soap and water prior to dressing change. Peri-Wound Care Sween Lotion (Moisturizing lotion) Discharge Instruction: Apply moisturizing lotion as directed Topical Primary Dressing Xeroform Occlusive Gauze Dressing, 4x4 in Discharge Instruction: Apply to wound bed as instructed Secondary Dressing Zetuvit Plus 4x8 in Discharge Instruction: Apply over primary dressing as directed. Secured With Compression Wrap ThreePress (3 layer compression wrap) Discharge Instruction: Apply three layer compression as directed. Compression Stockings Add-Ons Electronic Signature(s) Signed: 07/13/2022 3:41:21 PM By: Rhae Hammock [name] Signed: 07/13/2022 3:55:04 PM By: Deon Pilling [name], [name] Entered By: Deon Pilling on 07/13/2022 10:03:38 -------------------------------------------------------------------------------- Vitals Details Patient Name: Date of  Service: [name], [name] [name] 07/13/2022 10:00 [place] Record Number: [id number] Patient Account Number: [id number] Date of Birth/Sex: Treating [name]: 24-Sep-1925 (88 y.o. [name], [name] Primary Care Mellissa Conley: Marda Stalker Other Clinician: Referring Kristof Nadeem: Treating Emeli Goguen/Extender: Aviva Signs in Treatment: 32 Vital Signs Time Taken: 09:57 Temperature (F): 97.7 Height (in): 66 Pulse (bpm): 74 Weight (lbs): 176 Respiratory Rate (breaths/min): 17 Body Mass Index (BMI): 28.4 Blood Pressure (mmHg): 134/77 Reference Range:  80 - 120 mg / dl Electronic Signature(s) Signed: 07/13/2022 3:41:21 PM By: Rhae Hammock [name] Entered By: Rhae Hammock on 07/13/2022 09:57:31

## 2022-07-14 NOTE — Patient Instructions (Incomplete)
Below is our plan:  We will continue quetiapine 25mg  twice daily. Monitor mood closely. Consider counseling or change in antidepressant if needed.   Please make sure you are staying well hydrated. I recommend 50-60 ounces daily. Well balanced diet and regular exercise encouraged. Consistent sleep schedule with 6-8 hours recommended.   Please continue follow up with care team as directed.   Follow up with me in 1 year  You may receive a survey regarding today's visit. I encourage you to leave honest feed back as I do use this information to improve patient care. Thank you for seeing me today!   Management of Memory Problems   There are some general things you can do to help manage your memory problems.  Your memory may not in fact recover, but by using techniques and strategies you will be able to manage your memory difficulties better.   1)  Establish a routine. Try to establish and then stick to a regular routine.  By doing this, you will get used to what to expect and you will reduce the need to rely on your memory.  Also, try to do things at the same time of day, such as taking your medication or checking your calendar first thing in the morning. Think about think that you can do as a part of a regular routine and make a list.  Then enter them into a daily planner to remind you.  This will help you establish a routine.   2)  Organize your environment. Organize your environment so that it is uncluttered.  Decrease visual stimulation.  Place everyday items such as keys or cell phone in the same place every day (ie.  Basket next to front door) Use post it notes with a brief message to yourself (ie. Turn off light, lock the door) Use labels to indicate where things go (ie. Which cupboards are for food, dishes, etc.) Keep a notepad and pen by the telephone to take messages   3)  Memory Aids A diary or journal/notebook/daily planner Making a list (shopping list, chore list, to do list that  needs to be done) Using an alarm as a reminder (kitchen timer or cell phone alarm) Using cell phone to store information (Notes, Calendar, Reminders) Calendar/White board placed in a prominent position Post-it notes   In order for memory aids to be useful, you need to have good habits.  It's no good remembering to make a note in your journal if you don't remember to look in it.  Try setting aside a certain time of day to look in journal.   4)  Improving mood and managing fatigue. There may be other factors that contribute to memory difficulties.  Factors, such as anxiety, depression and tiredness can affect memory. Regular gentle exercise can help improve your mood and give you more energy. Simple relaxation techniques may help relieve symptoms of anxiety Try to get back to completing activities or hobbies you enjoyed doing in the past. Learn to pace yourself through activities to decrease fatigue. Find out about some local support groups where you can share experiences with others. Try and achieve 7-8 hours of sleep at night.

## 2022-07-14 NOTE — Progress Notes (Unsigned)
PATIENT: [name] DOB: 07/11/1925  REASON FOR VISIT: follow up HISTORY FROM: patient  No chief complaint on file.     HISTORY OF PRESENT ILLNESS:  Marylouise Mallet is a 88 year old female, seen in request by her primary care PA Marda Stalker for evaluation of dementia, initial evaluation was on June 05, 2019.  She is accompanied by her granddaughter [name] at today's visit.   I have reviewed and summarized the referring note from the referring physician.  She had past medical history of breast cancer, left side was treated in 1994, right side was in 2007, she also had a history of DVT, endometrial adenocarcinoma, peripheral vascular disease, she lives at home with her daughter, the care of her 1.05 is [name].   She began to have gradual onset memory loss since 2019, getting worse rapidly, she is independent in daily activity, no longer driving, but still make simple meals, dressing, toileting, bathing without help, she has decreased appetite, sometimes has difficulty sleeping   She has gradual worsening visual hallucinations, she see people at her home, talking, irritated sometimes scared her much, she got up in the middle of the night, she has to use the cane to knock on the wall to stop them talking.  This happened mostly at the evening time, sometimes at daytime as well,   She ambulate with a cane due to low back, joint pain,   Laboratory evaluations in April 2020, hematocrit 34, creatinine 1.05, normal B12, TSH 3.57, Personally reviewed CT head in April 2020: Generalized atrophy, small vessel disease, no acute abnormalities.   UPDATE 07/08/2020 ALL: [name] is a 88 y.o. female here today for follow up. She continues Seroquel '50mg'$  at bedtime. Her daughter presents today and aids in history. She reports that Seroquel has helped significantly with sleep. She is resting well. She does continue to have hallucinations but feel they are not as frequent.  Hallucinations are auditory and visual and occur several times a week. She is sometimes frightened but easily reassured. She remains active. She lives with her daughter. She is able to do her own grocery shopping. She does not drive. She is needing more assistance with walking. No falls. She uses a cane for short distances and a Rolator for longer distances. She is able to performs ADL's. She requires assistance with medications. Family decided against MRI and PT ordered at last visit. They do not feel these are needed.   UPDATE 07/08/2021 ALL: [name] returns for follow for follow up for AD. Her daughter presents with her and gives most of history. She is stable. No significant changes. She continues quetiapine '25mg'$  early afternoon and right before bed. She continues to have visual and auditory hallucinations. Mostly at night. She does seem frightened at times but easily distracted and reassured. She is sleeping more. Eating less. She has lost some weight but seems stable. She has had three falls since last being seen. No obvious injuries. She is using cane or walker in the home and wheelchair for longer distances. She lives with her daughter. She has 5 children, three daughters and two sons who all care for her. Her niece also helps.   Her daughter is concerned with a knot on the right side of her scalp that Mrs Buccieri reports has been tender to touch for the past 4-5 days. Similar symptoms presented to PCP in March and treated as shingles. Symptoms resolved within a couple of days after starting valacyclovir. No obvious rash, fever, chills  or other signs of infection.   UPDATE 07/15/2022 ALL: Ms. Riemann returns for follow up for AD. She continues Seroquel '50mg'$  daily.    REVIEW OF SYSTEMS: Out of a complete 14 system review of symptoms, the patient complains only of the following symptoms, memory loss, scalp pain and all other reviewed systems are negative.   ALLERGIES: No Known Allergies  HOME  MEDICATIONS: Outpatient Medications Prior to Visit  Medication Sig Dispense Refill   acetaminophen (TYLENOL) 325 MG tablet Take 650 mg by mouth every 6 (six) hours as needed.     calcium carbonate (OS-CAL - DOSED IN MG OF ELEMENTAL CALCIUM) 1250 (500 Ca) MG tablet Take 1 tablet by mouth.     Cholecalciferol (VITAMIN D3) 1000 UNITS CAPS Take 1,000 Units by mouth daily.     citalopram (CELEXA) 20 MG tablet Take 20 mg by mouth daily.     ENTRESTO 24-26 MG TAKE 1 TABLET BY MOUTH TWICE DAILY 180 tablet 3   furosemide (LASIX) 20 MG tablet TAKE 1 TABLET BY MOUTH AS NEEDED 90 tablet 1   gentamicin cream (GARAMYCIN) 0.1 % Apply topically.     Multiple Vitamins-Minerals (CENTRUM SILVER PO) Take 1 tablet by mouth daily.     NONFORMULARY OR COMPOUNDED ITEM Antifungal solution: Terbinafine 3%, Fluconazole 2%, Tea Tree Oil 5%, Urea 10%, Ibuprofen 2% in DMSO suspension #64m 1 each 3   Omega-3 Fatty Acids (FISH OIL) 1200 MG CPDR 1 capsule     QUEtiapine (SEROQUEL) 25 MG tablet Take 2 tablets (50 mg total) by mouth at bedtime. 180 tablet 3   No facility-administered medications prior to visit.    PAST MEDICAL HISTORY: Past Medical History:  Diagnosis Date   Anxiety    Breast cancer ([facility]    left 1994, right 2007   Diverticulosis    Diverticulosis of colon with hemorrhage 06/19/2014   DVT (deep venous thrombosis) ([facility] 1992   right leg x 2   Endometrial adenocarcinoma ([facility] 12/25/12   biopsy   Family history of malignant neoplasm of gastrointestinal tract    GERD (gastroesophageal reflux disease)    History of blood transfusion    Hx of radiation therapy 02/2006 - 03/2006   right breast   Hx of radiation therapy 5/12, 5/19, 5/29, 6/2, 04/30/2013   proximal vagina, 30 Gy in 5 sessions, HDR   Hypertension    Iron deficiency    Memory loss    Peripheral vascular disease ([facility]    leg wounds   Personal history of radiation therapy    Ulcers of both lower extremities ([facility] [place]    chronic   Venous stasis     bilateral    PAST SURGICAL HISTORY: Past Surgical History:  Procedure Laterality Date   ABDOMINAL HYSTERECTOMY  01/18/13   robotic, bso   BREAST LUMPECTOMY Right    COLONOSCOPY WITH PROPOFOL N/A 09/16/2017   Procedure: COLONOSCOPY WITH PROPOFOL;  Surgeon: SWilford Corner MD;  [facility];  Service: Endoscopy;  Laterality: N/A;  Needs STAT CBC drawn before procedure   MASTECTOMY Left 1994   left , Tamoxifen x 17 yrs   partial mastectomy Right 2007   radiation    FAMILY HISTORY: Family History  Problem Relation Age of Onset   Cancer Father        skin   Other Mother        died during childbirth   Cancer Brother        prostate   Prostate cancer Brother  Breast cancer Daughter    Colon cancer Sister    Heart disease Sister        MI   Breast cancer Sister    Anuerysm Sister     SOCIAL HISTORY: Social History   Socioeconomic History   Marital status: Widowed    Spouse name: Not on file   Number of children: 5   Years of education: 2nd or 3rd grade   Highest education level: Not on file  Occupational History   Occupation: Retired   Tobacco Use   Smoking status: Never   Smokeless tobacco: Never  Vaping Use   Vaping Use: Never used  Substance and Sexual Activity   Alcohol use: No   Drug use: No   Sexual activity: Never  Other Topics Concern   Not on file  Social History Narrative   No caffeine use.   Lives at home with her daughter.   Right-handed.   Social Determinants of Health   Financial Resource Strain: Not on file  Food Insecurity: Not on file  Transportation Needs: Not on file  Physical Activity: Not on file  Stress: Not on file  Social Connections: Not on file  Intimate Partner Violence: Not on file      PHYSICAL EXAM  There were no vitals filed for this visit.   There is no height or weight on file to calculate BMI.  Generalized: Well developed, in no acute distress  Cardiology: normal rate and rhythm, no murmur  noted Respiratory: clear to auscultation bilaterally  Neurological examination  Mentation: Alert, pleasant, not oriented to time, place, but able to assist with some history taking. Follows all commands speech and language fluent Cranial nerve II-XII: Pupils were equal round reactive to light. Extraocular movements were full, visual field were full  Motor: The motor testing reveals 5 over 5 strength of all 4 extremities. Good symmetric motor tone is noted throughout.  Sensory: Sensory testing is intact to soft touch on all 4 extremities. No evidence of extinction is noted.  Coordination: Cerebellar testing reveals good finger-nose-finger and heel-to-shin bilaterally but requires repetitive redirection.  Gait and station: Gait not assessed today, she is in wheelchair.  Skin: tenderness reported with light palpation of right temporal/parietal region, very slight asymmetry compared to bony structure of left side, no obvious rash, slight erythema could be associated with her rubbing area, no signs of infection.   DIAGNOSTIC DATA (LABS, IMAGING, TESTING) - I reviewed patient records, labs, notes, testing and imaging myself where available.     07/08/2021   10:18 AM 06/05/2019    8:54 AM  MMSE - Mini Mental State Exam  Not completed: Unable to complete   Orientation to time  2  Orientation to Place  3  Registration  3  Attention/ Calculation  0  Recall  0  Language- name 2 objects  2  Language- repeat  1  Language- follow 3 step command  3  Language- read & follow direction  1  Write a sentence  0  Copy design  0  Total score  15        No data to display           Lab Results  Component Value Date   WBC 5.2 12/20/2018   HGB 10.5 (L) 12/20/2018   HCT 32.0 (L) 12/20/2018   MCV 87.2 12/20/2018   PLT 191 12/20/2018      Component Value Date/Time   NA 143 06/08/2022 1524   K 4.2  06/08/2022 1524   CL 106 06/08/2022 1524   CO2 24 06/08/2022 1524   GLUCOSE 80 06/08/2022 1524    GLUCOSE 89 12/20/2018 1137   BUN 35 06/08/2022 1524   CREATININE 1.45 (H) 06/08/2022 1524   CREATININE 1.08 (H) 12/20/2018 1137   CALCIUM 9.5 06/08/2022 1524   PROT 6.8 12/20/2018 1137   ALBUMIN 3.5 12/20/2018 1137   AST 12 (L) 12/20/2018 1137   ALT 7 12/20/2018 1137   ALKPHOS 62 12/20/2018 1137   BILITOT 0.4 12/20/2018 1137   GFRNONAA 46 (L) 01/15/2019 1152   GFRNONAA 45 (L) 12/20/2018 1137   GFRAA 53 (L) 01/15/2019 1152   GFRAA 52 (L) 12/20/2018 1137   No results found for: "CHOL", "HDL", "LDLCALC", "LDLDIRECT", "TRIG", "CHOLHDL" No results found for: "HGBA1C" Lab Results  Component Value Date   VITAMINB12 1,566 (H) 01/02/2018   No results found for: "TSH"     ASSESSMENT AND PLAN 88 y.o. year old female  has a past medical history of Anxiety, Breast cancer ([place]), Diverticulosis, Diverticulosis of colon with hemorrhage (06/19/2014), DVT (deep venous thrombosis) ([place]) (1992), Endometrial adenocarcinoma ([place]) (12/25/12), Family history of malignant neoplasm of gastrointestinal tract, GERD (gastroesophageal reflux disease), History of blood transfusion, radiation therapy (02/2006 - 03/2006), radiation therapy (5/12, 5/19, 5/29, 6/2, 04/30/2013), Hypertension, Iron deficiency, Memory loss, Peripheral vascular disease ([place]), Personal history of radiation therapy, Ulcers of both lower extremities ([place]), and Venous stasis. here with   No diagnosis found.    Ms Welling is doing fairly well. She was unable to complete MMSE. We will continue Seroquel '50mg'$  daily. We have discussed concerns of hallucinations. We could consider increasing dose versus adding PRN anxiety medications if she has any worsening or aggressive behavioral changes. She was encouraged to discuss home health referral versus palliative referral with PCP. Family feels they are managing well at this time. They will monitor weight at home, BMI 27. Fall precautions reviewed. She was encouraged to stay physically and mentally active.  Memory compensation strategies reviewed. I will start her on valacyclovir '1000mg'$  BID for 7 days for presumed shingles. Family is aware to reach out to PCP for any worsening or unresolved symptoms. She will continue close follow up with PCP. Healthy lifestyle habits encouraged. Fall and safety precautions advised. She will follow up with [place] in 1 year, sooner if needed.    No orders of the defined types were placed in this encounter.     No orders of the defined types were placed in this encounter.       Debbora Presto, FNP-C 07/14/2022, 2:51 PM [facility] [address] [place], [place] [address] [phone]

## 2022-07-15 ENCOUNTER — Encounter: Payer: Self-pay | Admitting: Family Medicine

## 2022-07-15 ENCOUNTER — Ambulatory Visit: Payer: Medicare PPO | Admitting: Family Medicine

## 2022-07-15 VITALS — BP 124/67 | HR 61 | Ht 66.0 in | Wt 166.0 lb

## 2022-07-15 DIAGNOSIS — G309 Alzheimer's disease, unspecified: Secondary | ICD-10-CM | POA: Diagnosis not present

## 2022-07-15 MED ORDER — QUETIAPINE FUMARATE 25 MG PO TABS
50.0000 mg | ORAL_TABLET | Freq: Every day | ORAL | 3 refills | Status: DC
Start: 2022-07-15 — End: 2022-10-27

## 2022-07-20 ENCOUNTER — Encounter (HOSPITAL_BASED_OUTPATIENT_CLINIC_OR_DEPARTMENT_OTHER): Payer: Medicare PPO | Admitting: Internal Medicine

## 2022-07-20 DIAGNOSIS — G309 Alzheimer's disease, unspecified: Secondary | ICD-10-CM

## 2022-07-20 DIAGNOSIS — I87312 Chronic venous hypertension (idiopathic) with ulcer of left lower extremity: Secondary | ICD-10-CM

## 2022-07-20 DIAGNOSIS — I5042 Chronic combined systolic (congestive) and diastolic (congestive) heart failure: Secondary | ICD-10-CM | POA: Diagnosis not present

## 2022-07-20 DIAGNOSIS — I739 Peripheral vascular disease, unspecified: Secondary | ICD-10-CM | POA: Diagnosis not present

## 2022-07-20 DIAGNOSIS — L97822 Non-pressure chronic ulcer of other part of left lower leg with fat layer exposed: Secondary | ICD-10-CM | POA: Diagnosis not present

## 2022-07-20 DIAGNOSIS — I110 Hypertensive heart disease with heart failure: Secondary | ICD-10-CM | POA: Diagnosis not present

## 2022-07-20 DIAGNOSIS — Z853 Personal history of malignant neoplasm of breast: Secondary | ICD-10-CM | POA: Diagnosis not present

## 2022-07-23 NOTE — Progress Notes (Signed)
[name], [name] ([id number]) Visit Report for 07/20/2022 Arrival Information Details Patient Name: Date of Service: [name], [name] 07/20/2022 10:00 A M Medical Record Number: [id number] Patient Account Number: [id number] Date of Birth/Sex: Treating RN: [birth year] (88 y.o. [name], [name] Primary Care Jaylynn Mcaleer: Marda Stalker Other Clinician: Referring Lorriann Hansmann: Treating Idalys Konecny/Extender: Aviva Signs in Treatment: 82 Visit Information History Since Last Visit Added or deleted any medications: No Patient Arrived: Wheel Chair Any new allergies or adverse reactions: No Arrival Time: 10:13 Had a fall or experienced change in No Accompanied By: daughter activities of daily living that may affect Transfer Assistance: None risk of falls: Patient Identification Verified: Yes Signs or symptoms of abuse/neglect since last visito No Secondary Verification Process Completed: Yes Hospitalized since last visit: No Patient Requires Transmission-Based Precautions: No Implantable device outside of the clinic excluding No Patient Has Alerts: No cellular tissue based products placed in the center since last visit: Has Compression in Place as Prescribed: Yes Pain Present Now: No Electronic Signature(s) Signed: 07/20/2022 3:53:04 PM By: Erenest Blank Entered By: Erenest Blank on 07/20/2022 10:14:36 -------------------------------------------------------------------------------- Compression Therapy Details Patient Name: Date of Service: [name], [name] 07/20/2022 10:00 A M Medical Record Number: [id number] Patient Account Number: [id number] Date of Birth/Sex: Treating RN: April 27, 1925 (88 y.o. [name], [name] Primary Care Jarmal Lewelling: Marda Stalker Other Clinician: Referring Tashai Catino: Treating Karsin Pesta/Extender: Aviva Signs in Treatment: 95 Compression Therapy Performed for Wound Assessment: Wound #26 Left,Lateral  Ankle Performed By: Clinician Rhae Hammock, RN Compression Type: Three Layer Post Procedure Diagnosis Same as Pre-procedure Electronic Signature(s) Signed: 07/23/2022 1:10:23 PM By: [name] Entered By: Rhae Hammock on 07/20/2022 10:45:28 -------------------------------------------------------------------------------- Encounter Discharge Information Details Patient Name: Date of Service: [name], [name] 07/20/2022 10:00 [place] Record Number: [id number] Patient Account Number: [id number] Date of Birth/Sex: Treating RN: [birth year] (88 y.o. [name], [name] Primary Care Ladanian Kelter: Marda Stalker Other Clinician: Referring Grantley Savage: Treating Sotero Brinkmeyer/Extender: Aviva Signs in Treatment: 31 Encounter Discharge Information Items Discharge Condition: Stable Ambulatory Status: Ambulatory Discharge Destination: Home Transportation: Private Auto Accompanied By: daugther Schedule Follow-up Appointment: Yes Clinical Summary of Care: Patient Declined Electronic Signature(s) Signed: 07/23/2022 1:10:23 PM By: [name] Entered By: Rhae Hammock on 07/20/2022 11:13:17 -------------------------------------------------------------------------------- Lower Extremity Assessment Details Patient Name: Date of Service: [name], [name] 07/20/2022 10:00 [place] Record Number: [id number] Patient Account Number: [id number] Date of Birth/Sex: Treating RN: 1925-05-05 (88 y.o. [name], [name] Primary Care Oniya Mandarino: Marda Stalker Other Clinician: Referring Zade Falkner: Treating Caprisha Bridgett/Extender: Marjo Bicker Weeks in Treatment: 33 Edema Assessment Assessed: [Left: No] Patrice Paradise: No] Edema: [Left: N] [Right: o] Calf Left: Right: Point of Measurement: 30 cm From Medial Instep 34 cm 33 cm Ankle Left: Right: Point of Measurement: 9 cm From Medial Instep 20.4 cm 21 cm Electronic  Signature(s) Signed: 07/20/2022 3:53:04 PM By: Erenest Blank Signed: 07/23/2022 1:10:23 PM By: [name] Entered By: Erenest Blank on 07/20/2022 10:24:09 -------------------------------------------------------------------------------- Multi Wound Chart Details Patient Name: Date of Service: [name], [name] 07/20/2022 10:00 A M Medical Record Number: [id number] Patient Account Number: [id number] Date of Birth/Sex: Treating RN: 20-May-1925 (88 y.o. [name], [name] Primary Care Lattie Riege: Marda Stalker Other Clinician: Referring Jaevian Shean: Treating Davon Abdelaziz/Extender: Aviva Signs in Treatment: 71 Vital Signs Height(in): 66 Pulse(bpm): 35 Weight(lbs): 176 Blood Pressure(mmHg): 157/76 Body Mass Index(BMI): 28.4 Temperature(F): 98 Respiratory Rate(breaths/min): 18 Photos: [N/A:N/A] Left, Lateral Ankle N/A N/A Wound Location: Gradually Appeared N/A N/A Wounding Event: Venous  Leg Ulcer N/A N/A Primary Etiology: Anemia, Congestive Heart Failure, N/A N/A Comorbid History: Hypertension, Peripheral Venous Disease, Osteoarthritis, Received Chemotherapy 11/20/2021 N/A N/A Date Acquired: 72 N/A N/A Weeks of Treatment: Open N/A N/A Wound Status: No N/A N/A Wound Recurrence: 0.7x0.5x0.1 N/A N/A Measurements L x W x D (cm) 0.275 N/A N/A A (cm) : rea 0.027 N/A N/A Volume (cm) : 97.10% N/A N/A % Reduction in Area: 97.10% N/A N/A % Reduction in Volume: Full Thickness Without Exposed N/A N/A Classification: Support Structures Medium N/A N/A Exudate Amount: Serosanguineous N/A N/A Exudate Type: red, brown N/A N/A Exudate Color: Distinct, outline attached N/A N/A Wound Margin: Large (67-100%) N/A N/A Granulation Amount: Red N/A N/A Granulation Quality: None Present (0%) N/A N/A Necrotic Amount: Fat Layer (Subcutaneous Tissue): Yes N/A N/A Exposed Structures: Fascia: No Tendon: No Muscle: No Joint: No Bone:  No Medium (34-66%) N/A N/A Epithelialization: Compression Therapy N/A N/A Procedures Performed: Treatment Notes Wound #26 (Ankle) Wound Laterality: Left, Lateral Cleanser Soap and Water Discharge Instruction: May shower and wash wound with dial antibacterial soap and water prior to dressing change. Peri-Wound Care Sween Lotion (Moisturizing lotion) Discharge Instruction: Apply moisturizing lotion as directed Topical Primary Dressing Xeroform Occlusive Gauze Dressing, 4x4 in Discharge Instruction: Apply to wound bed as instructed Secondary Dressing Zetuvit Plus 4x8 in Discharge Instruction: Apply over primary dressing as directed. Secured With Compression Wrap ThreePress (3 layer compression wrap) Discharge Instruction: Apply three layer compression as directed. Compression Stockings Add-Ons Electronic Signature(s) Signed: 07/20/2022 12:41:14 PM By: Kalman Shan DO Signed: 07/23/2022 1:10:23 PM By: [name] Entered By: Kalman Shan on 07/20/2022 12:17:27 -------------------------------------------------------------------------------- Multi-Disciplinary Care Plan Details Patient Name: Date of Service: [name], [name] 07/20/2022 10:00 A M Medical Record Number: [id number] Patient Account Number: [id number] Date of Birth/Sex: Treating RN: 01-06-1925 (88 y.o. [name], [name] Primary Care Halsey Persaud: Marda Stalker Other Clinician: Referring Clever Geraldo: Treating Darsi Tien/Extender: Aviva Signs in Treatment: 28 Active Inactive Nutrition Nursing Diagnoses: Imbalanced nutrition Goals: Patient/caregiver agrees to and verbalizes understanding of need to use nutritional supplements and/or vitamins as prescribed Date Initiated: 11/27/2021 Target Resolution Date: 07/23/2022 Goal Status: Active Interventions: Assess patient nutrition upon admission and as needed per policy Provide education on nutrition Treatment  Activities: Education provided on Nutrition : 07/13/2022 Notes: 06/22/2022 #3 vendaje advance tissue product applied by product, compression continued. Electronic Signature(s) Signed: 07/23/2022 1:10:23 PM By: [name] Entered By: Rhae Hammock on 07/20/2022 10:41:48 -------------------------------------------------------------------------------- Pain Assessment Details Patient Name: Date of Service: [name], [name] 07/20/2022 10:00 A M Medical Record Number: [id number] Patient Account Number: [id number] Date of Birth/Sex: Treating RN: May 05, 1925 (88 y.o. [name], [name] Primary Care Brya Simerly: Marda Stalker Other Clinician: Referring Odis Wickey: Treating Emmalina Espericueta/Extender: Aviva Signs in Treatment: 16 Active Problems Location of Pain Severity and Description of Pain Patient Has Paino No Site Locations Pain Management and Medication Current Pain Management: Electronic Signature(s) Signed: 07/20/2022 3:53:04 PM By: Erenest Blank Signed: 07/23/2022 1:10:23 PM By: [name] Entered By: Erenest Blank on 07/20/2022 10:17:27 -------------------------------------------------------------------------------- Patient/Caregiver Education Details Patient Name: Date of Service: [name] 8/29/2023andnbsp10:00 A M Medical Record Number: [id number] Patient Account Number: [id number] Date of Birth/Gender: Treating RN: [birth year] (88 y.o. Benjaman Lobe Primary Care Physician: Marda Stalker Other Clinician: Referring Physician: Treating Physician/Extender: Aviva Signs in Treatment: 76 Education Assessment Education Provided To: Patient Education Topics Provided Nutrition: Methods: Explain/Verbal Responses: Reinforcements needed, State content correctly Electronic Signature(s) Signed: 07/23/2022 1:10:23 PM By:  [name] Entered By: Rhae Hammock on 07/20/2022  10:42:02 -------------------------------------------------------------------------------- Wound Assessment Details Patient Name: Date of Service: [name], [name] 07/20/2022 10:00 A M Medical Record Number: [id number] Patient Account Number: [id number] Date of Birth/Sex: Treating RN: Jul 25, 1925 (88 y.o. [name], [name] Primary Care Juelz Claar: Marda Stalker Other Clinician: Referring Tangia Pinard: Treating Jadian Karman/Extender: Marjo Bicker Weeks in Treatment: 33 Wound Status Wound Number: 26 Primary Venous Leg Ulcer Etiology: Wound Location: Left, Lateral Ankle Wound Open Wounding Event: Gradually Appeared Status: Date Acquired: 11/20/2021 Comorbid Anemia, Congestive Heart Failure, Hypertension, Peripheral Weeks Of Treatment: 33 History: Venous Disease, Osteoarthritis, Received Chemotherapy Clustered Wound: No Photos Wound Measurements Length: (cm) 0.7 Width: (cm) 0.5 Depth: (cm) 0.1 Area: (cm) 0.275 Volume: (cm) 0.027 % Reduction in Area: 97.1% % Reduction in Volume: 97.1% Epithelialization: Medium (34-66%) Tunneling: No Undermining: No Wound Description Classification: Full Thickness Without Exposed Support Structures Wound Margin: Distinct, outline attached Exudate Amount: Medium Exudate Type: Serosanguineous Exudate Color: red, brown Foul Odor After Cleansing: No Slough/Fibrino Yes Wound Bed Granulation Amount: Large (67-100%) Exposed Structure Granulation Quality: Red Fascia Exposed: No Necrotic Amount: None Present (0%) Fat Layer (Subcutaneous Tissue) Exposed: Yes Tendon Exposed: No Muscle Exposed: No Joint Exposed: No Bone Exposed: No Treatment Notes Wound #26 (Ankle) Wound Laterality: Left, Lateral Cleanser Soap and Water Discharge Instruction: May shower and wash wound with dial antibacterial soap and water prior to dressing change. Peri-Wound Care Sween Lotion (Moisturizing lotion) Discharge Instruction: Apply  moisturizing lotion as directed Topical Primary Dressing Xeroform Occlusive Gauze Dressing, 4x4 in Discharge Instruction: Apply to wound bed as instructed Secondary Dressing Zetuvit Plus 4x8 in Discharge Instruction: Apply over primary dressing as directed. Secured With Compression Wrap ThreePress (3 layer compression wrap) Discharge Instruction: Apply three layer compression as directed. Compression Stockings Add-Ons Electronic Signature(s) Signed: 07/20/2022 3:53:04 PM By: Erenest Blank Signed: 07/23/2022 1:10:23 PM By: [name] Entered By: Erenest Blank on 07/20/2022 10:26:29 -------------------------------------------------------------------------------- Vitals Details Patient Name: Date of Service: DANEE, SOLLER [name] 07/20/2022 10:00 A M Medical Record Number: [id number] Patient Account Number: [id number] Date of Birth/Sex: Treating RN: Dec 01, 1924 (88 y.o. [name], [name] Primary Care Fionn Stracke: Marda Stalker Other Clinician: Referring Nikea Settle: Treating Tevin Shillingford/Extender: Aviva Signs in Treatment: 51 Vital Signs Time Taken: 10:14 Temperature (F): 98 Height (in): 66 Pulse (bpm): 62 Weight (lbs): 176 Respiratory Rate (breaths/min): 18 Body Mass Index (BMI): 28.4 Blood Pressure (mmHg): 157/76 Reference Range: 80 - 120 mg / dl Electronic Signature(s) Signed: 07/20/2022 3:53:04 PM By: Erenest Blank Entered By: Erenest Blank on 07/20/2022 10:17:14

## 2022-07-23 NOTE — Progress Notes (Signed)
[name], [name] ([id number]) Visit Report for 07/06/2022 Chief Complaint Document Details Patient Name: Date of Service: [name], JARMON 07/06/2022 9:15 A M Medical Record Number: [id number] Patient Account Number: [id number] Date of Birth/Sex: Treating RN: November 05, 1925 (88 y.o. [name], [name]: Marda Stalker Other Clinician: Referring Provider: Treating Provider/Extender: Aviva Signs in Treatment: 31 Information Obtained from: Patient Chief Complaint Left lower extremity wound Electronic Signature(s) Signed: 07/06/2022 10:56:05 AM By: Kalman Shan DO Entered By: Kalman Shan on 07/06/2022 10:41:23 -------------------------------------------------------------------------------- Cellular or Tissue Based Product Details Patient Name: Date of Service: [name], [name] 07/06/2022 9:15 A M Medical Record Number: [id number] Patient Account Number: [id number] Date of Birth/Sex: Treating RN: Nov 27, 1924 (88 y.o. [name] Primary Care Provider: Marda Stalker Other Clinician: Referring Provider: Treating Provider/Extender: Aviva Signs in Treatment: 66 Cellular or Tissue Based Product Type Wound #26 Left,Lateral Ankle Applied to: Performed By: Physician Kalman Shan, DO Cellular or Tissue Based Product Type: Other Level of Consciousness (Pre-procedure): Awake and Alert Pre-procedure Verification/Time Out Yes - 09:52 Taken: Location: trunk / arms / legs Wound Size (sq cm): 1.12 Product Size (sq cm): 4 Waste Size (sq cm): 2 Waste Reason: wound size Amount of Product Applied (sq cm): 2 Instrument Used: Forceps, Scissors Lot #: [id number] Order #: 5 Expiration Date: 11/06/2024 Fenestrated: No Reconstituted: Yes Solution Type: normal Saline Solution Amount: 56m Lot #: [id number] Expiration Date: 08/22/2022 Secured: Yes Secured With: Steri-Strips Dressing  Applied: Yes Primary Dressing: Adaptic Procedural Pain: 0 Post Procedural Pain: 0 Response to Treatment: Procedure was tolerated well Level of Consciousness (Post- Awake and Alert procedure): Post Procedure Diagnosis Same as Pre-procedure Electronic Signature(s) Signed: 07/06/2022 10:56:05 AM By: HKalman ShanDO Signed: 07/08/2022 4:47:28 PM By: ZBlanche EastRN Entered By: ZBlanche Easton 07/06/2022 09:57:31 -------------------------------------------------------------------------------- Debridement Details Patient Name: Date of Service: [name], [name] 07/06/2022 9:15 A M Medical Record Number: [id number] Account Number: [id number] of Birth/Sex: Treating RN: 901-Jun-1926(88y.o. FTonita Phoenix [name]: WMarda StalkerOther Clinician: Referring Provider: Treating Provider/Extender: HAviva Signsin Treatment: 31 Debridement Performed for Assessment: Wound #26 Left,Lateral Ankle Performed By: Physician HKalman Shan DO Debridement Type: Debridement Severity of Tissue Pre Debridement: Fat layer exposed Level of Consciousness (Pre-procedure): Awake and Alert Pre-procedure Verification/Time Out Yes - 09:50 Taken: Start Time: 09:50 Pain Control: Lidocaine 5% topical ointment T Area Debrided (L x W): otal 1.4 (cm) x 0.8 (cm) = 1.12 (cm) Tissue and other material debrided: Viable, Non-Viable, Slough, Subcutaneous, Slough Level: Skin/Subcutaneous Tissue Debridement Description: Excisional Instrument: Curette Bleeding: Minimum Hemostasis Achieved: Pressure Procedural Pain: 0 Post Procedural Pain: 0 Response to Treatment: Procedure was tolerated well Level of Consciousness (Post- Awake and Alert procedure): Post Debridement Measurements of Total Wound Length: (cm) 1.4 Width: (cm) 0.8 Depth: (cm) 0.1 Volume: (cm) 0.088 Character of Wound/Ulcer Post Debridement: Improved Severity of Tissue Post  Debridement: Fat layer exposed Post Procedure Diagnosis Same as Pre-procedure Electronic Signature(s) Signed: 07/06/2022 10:56:05 AM By: HKalman ShanDO Signed: 07/08/2022 4:47:28 PM By: ZBlanche EastRN Signed: 07/23/2022 1:11:09 PM By: BRhae HammockRN Entered By: ZBlanche Easton 07/06/2022 09:51:49 -------------------------------------------------------------------------------- HPI Details Patient Name: Date of Service: [name], [name] 07/06/2022 9:15 A M Medical Record Number: [id number] Account Number: [id number] of Birth/Sex: Treating RN: [phone](88y.o. FTonita Phoenix [name]: WMarda StalkerOther Clinician: Referring Provider: Treating Provider/Extender: HAviva Signsin Treatment: 31 History of  Present Illness HPI Description: this is a patient we know from several prior wounds on her bilateral lower extremities. She has venous stasis physiology, inflammation and hypertension. She is been fully evaluated for venous ablation and was found not to be a candidate. She does not have significant arterial disease. 02/14/15 the wound itself on her lateral left leg did not look too bad however there was surrounding maceration which was concerning 02/27/15 the wound itself is small with surrounding circumferential epithelialization there is no surrounding maceration. 03/06/15 only a very small area remains. I think this is mostly epithelialized however I would be uncomfortable not dressing this this week 03/13/15 the area is epithelialized. There was a thick surface on this. I took a #15 blade then lightly disrupted it but there does not appear to be any open area I did not see anything but appropriate epithelium Readmission: 08/16/18 on evaluation today patient presents for initial evaluation and our clinic released readmission although she has not been seen since April 2016. Nonetheless she is having issues with the  ulcers currently on the bilateral lateral malleolus or locations as well as the left lateral lower extremity. These have been present for roughly 3 weeks since the patient actually developed significant bilateral lower extremity edema which patient's daughter states was roughly 3 times the size of her legs currently. Subsequently she ended up in the emerging department due to congestive heart failure. They were able to get the swelling under control and fortunately she seems to be doing much better at this point in time. She was placed in an [employer] which she sat on for week part evaluation today as well. Fortunately the wound areas actually seem to be doing fairly well there is some macerated skin surrounding there are the areas/surface of the wound which are gonna require some debridement at this point. No fevers, chills, nausea, or vomiting noted at this time. Patient has a history of hypertension, mild peripheral vascular disease, chronic venous stasis, and other than the ulcer seems to be doing fairly well today. No fevers, chills, nausea, or vomiting noted at this time. 08/23/18 on evaluation today patient actually appears to be doing great in regard to her bilateral lower should be ulcers. She's shown signs of improvement even just with one week with the [employer]. In general I'm actually very pleased with how things appear at this point. 09/06/18 on evaluation today patient actually appears to be doing much better her left lower extremity is completely healed. Go to the right lower extremity lateral malleolus ulcer this still seems to be showing signs of being open. There does not appear to be evidence of infection which is good news. This did require some sharp debridement however to remove some of the necrotic tissue/eschar on the surface of the wound Readmission 11/27/2021 Ms. Minaal Struckman is a 88 year old female with a past medical history of endometrial and breast cancer,  chronic venous insufficiency, dementia and essential hypertension that presents to the clinic with a 1 month history of nonhealing ulcer to the left lower extremity. She states that the ulcer opened 1 month ago and then healed but has reopened again in the past week. She is currently keeping the area covered. She has a history of wounds to her lower extremities. She has compression stockings that she uses daily. She currently denies signs of infection. 1/13; patient presents for follow-up. She followed up for nurse visit for wrap change. She reports more odor. She denies pain. 1/20;  patient presents for follow-up. She has been using gentamicin ointment with calcium alginate to the wound bed. She reports improvement in odor and drainage. She currently denies systemic signs of infection. 1/27; patient presents for follow-up. She has been using gentamicin ointment with calcium alginate. She received her Keystone antibiotic 2 days ago. She brought this in. Home health has also been established. They will come out for the first time next week. She currently denies signs of infection. She has no issues or complaints today. 2/2; patient presents for follow-up. She has been using Keystone antibiotics with [facility] with no issues. She denies signs of infection. She has no issues or complaints today. 2/9; patient presents for follow-up. She has been using Keystone antibiotics with [facility] with no issues. She has tolerated the compression wrap well. She denies signs of infection. 2/16; patient presents for follow-up. She has been using Keystone antibiotics with Hydrofera Blue under compression wrap with no issues. She currently denies signs of infection. 2/23; patient presents for follow-up. She continues to use Keystone antibiotics with Hydrofera Blue under the compression wrap. She has no issues or complaints today. She denies signs of infection. Home health continues to come out and change the  wrap. 3/2; patient presents for follow-up. She continues to use [facility] with [facility] under compression with no issues. She denies signs of infection. 3/16; patient presents for follow-up. She continues to use [facility] with [facility] under compression with no issues. Home health change the dressing last week. She denies signs of infection. 3/23; patient presents for follow-up. She continues to use Keystone antibiotics with Hydrofera Blue under compression with no issues. Home health continues to change the dressings. She denies signs of infection. 3/30; patient presents for follow-up. She continues to use Keystone antibiotics with Hydrofera Blue under compression with no issues. Home health continues to change the dressings. 4/13 2-week follow-up. Keystone and Hydrofera Blue wound is roughly 2.3 cm in diameter smaller. No other issues 4/20; patient presents for follow-up. She continues to use Keystone antibiotic with [facility] with no issues. 4/28; patient with a difficult wound on her right medial lower leg. Using [facility] Blue minimal improvement per our intake nurse we are using compression. She has home health changing the dressing 5/5; patient presents for follow-up. We have been using Keystone antibiotics and Hydrofera Blue under compression therapy. She has no issues or complaints today. We discussed potentially using an advanced tissue product. She states she has had this done in the past and would like to proceed to see what her insurance would cover. 5/15; patient presents for follow-up. She has been using Keystone antibiotics and Hydrofera Blue under compression therapy. She forgot her Keystone antibiotics today. Insurance has not approved her for skin substitute. She currently denies signs of infection. She has home health that changes the dressing once a week. 5/19; patient presents for follow-up. We have been using Keystone antibiotic and Hydrofera Blue  under compression therapy. She has no issues or complaints today. The [employer] rep confirmed over the phone that the patient has been 100% approved for their skin substitute. We will verify this as we have not received any paperwork stating this. Patient states she would like to proceed with a skin substitute if it is covered by insurance. 6/1; patient presents for follow-up. We have been using Keystone antibiotic and Hydrofera Blue under compression therapy. We confirmed that she does have a co-pay for the [employer] skin substitute. We will hold off on  using this for now since she continues to show improvement with current therapy. She denies signs of infection. She has home health that comes out twice weekly. 6/16; patient presents for follow-up. We have been using Keystone antibiotic and Hydrofera Blue under compression therapy. She has no issues or complaints today. We discussed the potential trial of vendaje. This is placenta tissue. She states she would like to try the free trial. We will reach out to our rep. 6/30; patient presents for follow-up. She has been using Keystone antibiotic and Hydrofera Blue under compression therapy. She has been approved for a trial of vendaje. She would like to proceed with this and We will have this available at next clinic visit. She has no issues or complaints today. 7/18; patient presents for follow-up. She has been using Keystone antibiotic and Hydrofera Blue under compression. She has no issues or complaints today. We have a free sample of vendaje skin substitute and patient is agreeable to having this placed today. 7/24; patient presents for follow-up. We placed the first free trial of vendaje last week under compression wrap. Patient had no issues or complaints today. She tolerated this well. 8/1; patient presents for follow-up. Vandaje #2 donated product was placed in standard fashion last week under compression therapy. She has no issues or complaints  today. 8/8; patient presents for follow-up. Vendaje #3 donated skin set was placed in standard fashion last visit under compression therapy. She has no issues or complaints today. 8/15; Patient presents for follow-up. Vandaje #4 was placed in standard fashion last week under compression therapy. She has no issues or complaints today. Electronic Signature(s) Signed: 07/06/2022 10:56:05 AM By: Kalman Shan DO Entered By: Kalman Shan on 07/06/2022 10:41:56 -------------------------------------------------------------------------------- Physical Exam Details Patient Name: Date of Service: [name], [name] 07/06/2022 9:15 A M Medical Record Number: [id number] Patient Account Number: [id number] Date of Birth/Sex: Treating RN: July 15, 1925 (88 y.o. [name], [name]: Marda Stalker Other Clinician: Referring Provider: Treating Provider/Extender: Marjo Bicker Weeks in Treatment: 31 Constitutional respirations regular, non-labored and within target range for patient.. Cardiovascular 2+ dorsalis pedis/posterior tibialis pulses. Psychiatric pleasant and cooperative. Notes Left lower extremity: T the lateral aspect there is an open wound with granulation tissue and nonviable tissue present. No surrounding signs of infection. o Electronic Signature(s) Signed: 07/06/2022 10:56:05 AM By: Kalman Shan DO Entered By: Kalman Shan on 07/06/2022 10:45:44 -------------------------------------------------------------------------------- Physician Orders Details Patient Name: Date of Service: GERIANNE, [name] 07/06/2022 9:15 A M Medical Record Number: [id number] Patient Account Number: [id number] Date of Birth/Sex: Treating RN: [birth year] (88 y.o. [name], [name]: Marda Stalker Other Clinician: Referring Provider: Treating Provider/Extender: Aviva Signs in Treatment:  60 Verbal / Phone Orders: No Diagnosis Coding ICD-10 Coding Code Description I87.312 Chronic venous hypertension (idiopathic) with ulcer of left lower extremity L97.822 Non-pressure chronic ulcer of other part of left lower leg with fat layer exposed C50.919 Malignant neoplasm of unspecified site of unspecified female breast I50.42 Chronic combined systolic (congestive) and diastolic (congestive) heart failure G30.9 Alzheimer's disease, unspecified C54.1 Malignant neoplasm of endometrium Follow-up Appointments ppointment in 1 week. - 07/13/22 @ 10:00am w/ Dr. Heber Severn and Allayne Butcher Room # 9 Return A Cellular or Tissue Based Products Cellular or Tissue Based Product Type: - Vendaje #2 applied 06/14/2022 Vendaje #3 applied 06/22/2022 Vendaje # 4 applied 06/29/22 Vendaje # 5 applied 07/06/22 Cellular or Tissue Based Product applied to wound bed, secured with steri-strips, cover with Adaptic or Mepitel. (DO  NOT REMOVE). Bathing/ Shower/ Hygiene May shower with protection but do not get wound dressing(s) wet. - use cast protector. May shower and wash wound with soap and water. - with dressing changes may wash with soap and water. Edema Control - Lymphedema / SCD / Other Elevate legs to the level of the heart or above for 30 minutes daily and/or when sitting, a frequency of: Avoid standing for long periods of time. Patient to wear own compression stockings every day. - apply in the morning and remove at night to right leg. Exercise regularly Moisturize legs daily. - every night before day right leg. Additional Orders / Instructions Follow Nutritious Diet - High Protein Diet Home Health No change in wound care orders this week; continue Home Health for wound care. May utilize formulary equivalent dressing for wound treatment orders unless otherwise specified. - Home Health for skilled wound care nursing once a week, wound care once a week ***SKIN SUB PLACED; DO NOT REMOVE; DO NOT GET WET ONLY  TAKE OFF OUTER DRESSING AND RE-WRAP**** ; NO ZINC OXIDE!! Other Home Health Orders/Instructions: [facility] home health Wound Treatment Wound #26 - Ankle Wound Laterality: Left, Lateral Cleanser: Soap and Water (Home Health) 2 x Per Week/30 Days Discharge Instructions: May shower and wash wound with dial antibacterial soap and water prior to dressing change. Peri-Wound Care: Sween Lotion (Moisturizing lotion) (Home Health) 2 x Per Week/30 Days Discharge Instructions: Apply moisturizing lotion as directed Prim Dressing: VENDAJE SKIN SUB, ADAPTIC, STERI STRIPS 2 x Per Week/30 Days ary Discharge Instructions: applied directly to wound bed by provider. Secondary Dressing: Zetuvit Plus 4x8 in [facility]) 2 x Per Week/30 Days Discharge Instructions: Apply over primary dressing as directed. Compression Wrap: ThreePress (3 layer compression wrap) (Home Health) 2 x Per Week/30 Days Discharge Instructions: Apply three layer compression as directed. Electronic Signature(s) Signed: 07/06/2022 10:56:05 AM By: Kalman Shan DO Entered By: Kalman Shan on 07/06/2022 10:45:52 -------------------------------------------------------------------------------- Problem List Details Patient Name: Date of Service: MONTGOMERY, ROTHLISBERGER RNESTINE 07/06/2022 9:15 A M Medical Record Number: [id number] Patient Account Number: [id number] Date of Birth/Sex: Treating RN: [birth year] (88 y.o. [name], [name]: Marda Stalker Other Clinician: Referring Provider: Treating Provider/Extender: Aviva Signs in Treatment: 41 Active Problems ICD-10 Encounter Code Description Active Date MDM Diagnosis I87.312 Chronic venous hypertension (idiopathic) with ulcer of left lower extremity 11/27/2021 No Yes L97.822 Non-pressure chronic ulcer of other part of left lower leg with fat layer exposed1/04/2022 No Yes C50.919 Malignant neoplasm of unspecified site of unspecified  female breast 11/27/2021 No Yes I50.42 Chronic combined systolic (congestive) and diastolic (congestive) heart failure 11/27/2021 No Yes G30.9 Alzheimer's disease, unspecified 11/27/2021 No Yes C54.1 Malignant neoplasm of endometrium 11/27/2021 No Yes Inactive Problems Resolved Problems Electronic Signature(s) Signed: 07/06/2022 10:56:05 AM By: Kalman Shan DO Entered By: Kalman Shan on 07/06/2022 10:41:13 -------------------------------------------------------------------------------- Progress Note Details Patient Name: Date of Service: RACHELL, DRUCKENMILLER RNESTINE 07/06/2022 9:15 A M Medical Record Number: [id number] Patient Account Number: [id number] Date of Birth/Sex: Treating RN: [birth year] (88 y.o. [name], [name]: Marda Stalker Other Clinician: Referring Provider: Treating Provider/Extender: Aviva Signs in Treatment: 31 Subjective Chief Complaint Information obtained from Patient Left lower extremity wound History of Present Illness (HPI) this is a patient we know from several prior wounds on her bilateral lower extremities. She has venous stasis physiology, inflammation and hypertension. She is been fully evaluated for venous ablation and was found not to be a candidate. She  does not have significant arterial disease. 02/14/15 the wound itself on her lateral left leg did not look too bad however there was surrounding maceration which was concerning 02/27/15 the wound itself is small with surrounding circumferential epithelialization there is no surrounding maceration. 03/06/15 only a very small area remains. I think this is mostly epithelialized however I would be uncomfortable not dressing this this week 03/13/15 the area is epithelialized. There was a thick surface on this. I took a #15 blade then lightly disrupted it but there does not appear to be any open area I did not see anything but appropriate  epithelium Readmission: 08/16/18 on evaluation today patient presents for initial evaluation and our clinic released readmission although she has not been seen since April 2016. Nonetheless she is having issues with the ulcers currently on the bilateral lateral malleolus or locations as well as the left lateral lower extremity. These have been present for roughly 3 weeks since the patient actually developed significant bilateral lower extremity edema which patient's daughter states was roughly 3 times the size of her legs currently. Subsequently she ended up in the emerging department due to congestive heart failure. They were able to get the swelling under control and fortunately she seems to be doing much better at this point in time. She was placed in an [employer] which she sat on for week part evaluation today as well. Fortunately the wound areas actually seem to be doing fairly well there is some macerated skin surrounding there are the areas/surface of the wound which are gonna require some debridement at this point. No fevers, chills, nausea, or vomiting noted at this time. Patient has a history of hypertension, mild peripheral vascular disease, chronic venous stasis, and other than the ulcer seems to be doing fairly well today. No fevers, chills, nausea, or vomiting noted at this time. 08/23/18 on evaluation today patient actually appears to be doing great in regard to her bilateral lower should be ulcers. She's shown signs of improvement even just with one week with the [employer]. In general I'm actually very pleased with how things appear at this point. 09/06/18 on evaluation today patient actually appears to be doing much better her left lower extremity is completely healed. Go to the right lower extremity lateral malleolus ulcer this still seems to be showing signs of being open. There does not appear to be evidence of infection which is good news. This did require some sharp  debridement however to remove some of the necrotic tissue/eschar on the surface of the wound Readmission 11/27/2021 Ms. Clarivel Callaway is a 88 year old female with a past medical history of endometrial and breast cancer, chronic venous insufficiency, dementia and essential hypertension that presents to the clinic with a 1 month history of nonhealing ulcer to the left lower extremity. She states that the ulcer opened 1 month ago and then healed but has reopened again in the past week. She is currently keeping the area covered. She has a history of wounds to her lower extremities. She has compression stockings that she uses daily. She currently denies signs of infection. 1/13; patient presents for follow-up. She followed up for nurse visit for wrap change. She reports more odor. She denies pain. 1/20; patient presents for follow-up. She has been using gentamicin ointment with calcium alginate to the wound bed. She reports improvement in odor and drainage. She currently denies systemic signs of infection. 1/27; patient presents for follow-up. She has been using gentamicin ointment with calcium  alginate. She received her Keystone antibiotic 2 days ago. She brought this in. Home health has also been established. They will come out for the first time next week. She currently denies signs of infection. She has no issues or complaints today. 2/2; patient presents for follow-up. She has been using Keystone antibiotics with [facility] with no issues. She denies signs of infection. She has no issues or complaints today. 2/9; patient presents for follow-up. She has been using Keystone antibiotics with [facility] with no issues. She has tolerated the compression wrap well. She denies signs of infection. 2/16; patient presents for follow-up. She has been using Keystone antibiotics with Hydrofera Blue under compression wrap with no issues. She currently denies signs of infection. 2/23; patient presents  for follow-up. She continues to use Keystone antibiotics with Hydrofera Blue under the compression wrap. She has no issues or complaints today. She denies signs of infection. Home health continues to come out and change the wrap. 3/2; patient presents for follow-up. She continues to use [facility] with [facility] under compression with no issues. She denies signs of infection. 3/16; patient presents for follow-up. She continues to use [facility] with [facility] under compression with no issues. Home health change the dressing last week. She denies signs of infection. 3/23; patient presents for follow-up. She continues to use Keystone antibiotics with Hydrofera Blue under compression with no issues. Home health continues to change the dressings. She denies signs of infection. 3/30; patient presents for follow-up. She continues to use Keystone antibiotics with Hydrofera Blue under compression with no issues. Home health continues to change the dressings. 4/13 2-week follow-up. Keystone and Hydrofera Blue wound is roughly 2.3 cm in diameter smaller. No other issues 4/20; patient presents for follow-up. She continues to use Keystone antibiotic with [facility] with no issues. 4/28; patient with a difficult wound on her right medial lower leg. Using [facility] Blue minimal improvement per our intake nurse we are using compression. She has home health changing the dressing 5/5; patient presents for follow-up. We have been using Keystone antibiotics and Hydrofera Blue under compression therapy. She has no issues or complaints today. We discussed potentially using an advanced tissue product. She states she has had this done in the past and would like to proceed to see what her insurance would cover. 5/15; patient presents for follow-up. She has been using Keystone antibiotics and Hydrofera Blue under compression therapy. She forgot her Keystone antibiotics today. Insurance has not  approved her for skin substitute. She currently denies signs of infection. She has home health that changes the dressing once a week. 5/19; patient presents for follow-up. We have been using Keystone antibiotic and Hydrofera Blue under compression therapy. She has no issues or complaints today. The [employer] rep confirmed over the phone that the patient has been 100% approved for their skin substitute. We will verify this as we have not received any paperwork stating this. Patient states she would like to proceed with a skin substitute if it is covered by insurance. 6/1; patient presents for follow-up. We have been using Keystone antibiotic and Hydrofera Blue under compression therapy. We confirmed that she does have a co-pay for the [employer] skin substitute. We will hold off on using this for now since she continues to show improvement with current therapy. She denies signs of infection. She has home health that comes out twice weekly. 6/16; patient presents for follow-up. We have been using Keystone antibiotic and Hydrofera Blue under compression therapy.  She has no issues or complaints today. We discussed the potential trial of vendaje. This is placenta tissue. She states she would like to try the free trial. We will reach out to our rep. 6/30; patient presents for follow-up. She has been using Keystone antibiotic and Hydrofera Blue under compression therapy. She has been approved for a trial of vendaje. She would like to proceed with this and We will have this available at next clinic visit. She has no issues or complaints today. 7/18; patient presents for follow-up. She has been using Keystone antibiotic and Hydrofera Blue under compression. She has no issues or complaints today. We have a free sample of vendaje skin substitute and patient is agreeable to having this placed today. 7/24; patient presents for follow-up. We placed the first free trial of vendaje last week under compression wrap. Patient  had no issues or complaints today. She tolerated this well. 8/1; patient presents for follow-up. Vandaje #2 donated product was placed in standard fashion last week under compression therapy. She has no issues or complaints today. 8/8; patient presents for follow-up. Vendaje #3 donated skin set was placed in standard fashion last visit under compression therapy. She has no issues or complaints today. 8/15; Patient presents for follow-up. Vandaje #4 was placed in standard fashion last week under compression therapy. She has no issues or complaints today. Patient History Information obtained from Patient. Family History Cancer - Child, Hypertension - Child,Father, Kidney Disease - Child, Stroke - Siblings,Child, Thyroid Problems - Child, No family history of Diabetes, Heart Disease, Hereditary Spherocytosis, Lung Disease, Seizures, Tuberculosis. Social History Never smoker, Marital Status - Widowed, Alcohol Use - Never, Drug Use - No History, Caffeine Use - Never. Medical History Eyes Denies history of Cataracts, Glaucoma, Optic Neuritis Ear/Nose/Mouth/Throat Denies history of Chronic sinus problems/congestion, Middle ear problems Hematologic/Lymphatic Patient has history of Anemia Denies history of Hemophilia, Human Immunodeficiency Virus, Lymphedema, Sickle Cell Disease Respiratory Denies history of Aspiration, Asthma, Chronic Obstructive Pulmonary Disease (COPD), Pneumothorax, Sleep Apnea, Tuberculosis Cardiovascular Patient has history of Congestive Heart Failure, Hypertension, Peripheral Venous Disease Denies history of Angina, Arrhythmia, Coronary Artery Disease, Hypotension, Myocardial Infarction, Peripheral Arterial Disease, Phlebitis, Vasculitis Gastrointestinal Denies history of Cirrhosis , Colitis, Crohnoos, Hepatitis A, Hepatitis B, Hepatitis C Endocrine Denies history of Type I Diabetes, Type II Diabetes Genitourinary Denies history of End Stage Renal  Disease Immunological Denies history of Lupus Erythematosus, Raynaudoos, Scleroderma Integumentary (Skin) Denies history of History of Burn Musculoskeletal Patient has history of Osteoarthritis Denies history of Gout, Rheumatoid Arthritis, Osteomyelitis Neurologic Denies history of Neuropathy, Quadriplegia, Paraplegia, Seizure Disorder Oncologic Patient has history of Received Chemotherapy - Breast and Ovarian Cancer Denies history of Received Radiation Hospitalization/Surgery History - diverticulits. Objective Constitutional respirations regular, non-labored and within target range for patient.. Vitals Time Taken: 9:27 AM, Height: 66 in, Weight: 176 lbs, BMI: 28.4, Temperature: 97.5 F, Pulse: 63 bpm, Respiratory Rate: 17 breaths/min, Blood Pressure: 149/73 mmHg. Cardiovascular 2+ dorsalis pedis/posterior tibialis pulses. Psychiatric pleasant and cooperative. General Notes: Left lower extremity: T the lateral aspect there is an open wound with granulation tissue and nonviable tissue present. No surrounding signs of o infection. Integumentary (Hair, Skin) Wound #26 status is Open. Original cause of wound was Gradually Appeared. The date acquired was: 11/20/2021. The wound has been in treatment 31 weeks. The wound is located on the Left,Lateral Ankle. The wound measures 1.4cm length x 0.8cm width x 0.1cm depth; 0.88cm^2 area and 0.088cm^3 volume. There is Fat Layer (Subcutaneous Tissue) exposed. There is  no tunneling or undermining noted. There is a medium amount of serosanguineous drainage noted. The wound margin is distinct with the outline attached to the wound base. There is large (67-100%) red granulation within the wound bed. There is a small (1-33%) amount of necrotic tissue within the wound bed including Adherent Slough. Wound #26 status is Open. Original cause of wound was Gradually Appeared. The date acquired was: 11/20/2021. The wound has been in treatment 31 weeks. The  wound is located on the Left,Lateral Ankle. The wound measures 1.4cm length x 0.8cm width x 0.1cm depth; 0.88cm^2 area and 0.088cm^3 volume. There is Fat Layer (Subcutaneous Tissue) exposed. There is a medium amount of serosanguineous drainage noted. The wound margin is distinct with the outline attached to the wound base. There is large (67-100%) red granulation within the wound bed. There is a small (1-33%) amount of necrotic tissue within the wound bed including Adherent Slough. Assessment Active Problems ICD-10 Chronic venous hypertension (idiopathic) with ulcer of left lower extremity Non-pressure chronic ulcer of other part of left lower leg with fat layer exposed Malignant neoplasm of unspecified site of unspecified female breast Chronic combined systolic (congestive) and diastolic (congestive) heart failure Alzheimer's disease, unspecified Malignant neoplasm of endometrium Patient's wound is stable. I debrided nonviable tissue. Vandaje donated product #5 was placed in standard fashion today. We will continue compression therapy. Follow-up in 1 week. Procedures Wound #26 Pre-procedure diagnosis of Wound #26 is a Venous Leg Ulcer located on the Left,Lateral Ankle .Severity of Tissue Pre Debridement is: Fat layer exposed. There was a Excisional Skin/Subcutaneous Tissue Debridement with a total area of 1.12 sq cm performed by Kalman Shan, DO. With the following instrument(s): Curette to remove Viable and Non-Viable tissue/material. Material removed includes Subcutaneous Tissue and Slough and after achieving pain control using Lidocaine 5% topical ointment. No specimens were taken. A time out was conducted at 09:50, prior to the start of the procedure. A Minimum amount of bleeding was controlled with Pressure. The procedure was tolerated well with a pain level of 0 throughout and a pain level of 0 following the procedure. Post Debridement Measurements: 1.4cm length x 0.8cm width x 0.1cm  depth; 0.088cm^3 volume. Character of Wound/Ulcer Post Debridement is improved. Severity of Tissue Post Debridement is: Fat layer exposed. Post procedure Diagnosis Wound #26: Same as Pre-Procedure Pre-procedure diagnosis of Wound #26 is a Venous Leg Ulcer located on the Left,Lateral Ankle. A skin graft procedure using a bioengineered skin substitute/cellular or tissue based product was performed by Kalman Shan, DO with the following instrument(s): Forceps and Scissors. Other was applied and secured with Steri-Strips. 2 sq cm of product was utilized and 2 sq cm was wasted due to wound size. Post Application, Adaptic was applied. A Time Out was conducted at 09:52, prior to the start of the procedure. The procedure was tolerated well with a pain level of 0 throughout and a pain level of 0 following the procedure. Post procedure Diagnosis Wound #26: Same as Pre-Procedure . Plan Follow-up Appointments: Return Appointment in 1 week. - 07/13/22 @ 10:00am w/ Dr. Heber Glendive and Allayne Butcher Room # 9 Cellular or Tissue Based Products: Cellular or Tissue Based Product Type: - Vendaje #2 applied 06/14/2022 Vendaje #3 applied 06/22/2022 Vendaje # 4 applied 06/29/22 Vendaje # 5 applied 07/06/22 Cellular or Tissue Based Product applied to wound bed, secured with steri-strips, cover with Adaptic or Mepitel. (DO NOT REMOVE). Bathing/ Shower/ Hygiene: May shower with protection but do not get wound dressing(s) wet. - use cast  protector. May shower and wash wound with soap and water. - with dressing changes may wash with soap and water. Edema Control - Lymphedema / SCD / Other: Elevate legs to the level of the heart or above for 30 minutes daily and/or when sitting, a frequency of: Avoid standing for long periods of time. Patient to wear own compression stockings every day. - apply in the morning and remove at night to right leg. Exercise regularly Moisturize legs daily. - every night before day right leg. Additional  Orders / Instructions: Follow Nutritious Diet - High Protein Diet Home Health: No change in wound care orders this week; continue Home Health for wound care. May utilize formulary equivalent dressing for wound treatment orders unless otherwise specified. - Home Health for skilled wound care nursing once a week, wound care once a week ***SKIN SUB PLACED; DO NOT REMOVE; DO NOT GET WET ONLY TAKE OFF OUTER DRESSING AND RE-WRAP**** NO ZINC OXIDE!! ; Other Home Health Orders/Instructions: - [facility] home [facility] WOUND #26: - Ankle Wound Laterality: Left, Lateral Cleanser: Soap and Water (Home Health) 2 x Per Week/30 Days Discharge Instructions: May shower and wash wound with dial antibacterial soap and water prior to dressing change. Peri-Wound Care: Sween Lotion (Moisturizing lotion) (Home Health) 2 x Per Week/30 Days Discharge Instructions: Apply moisturizing lotion as directed Prim Dressing: VENDAJE SKIN SUB, ADAPTIC, STERI STRIPS 2 x Per Week/30 Days ary Discharge Instructions: applied directly to wound bed by provider. Secondary Dressing: Zetuvit Plus 4x8 in [facility]) 2 x Per Week/30 Days Discharge Instructions: Apply over primary dressing as directed. Com pression Wrap: ThreePress (3 layer compression wrap) (Home Health) 2 x Per Week/30 Days Discharge Instructions: Apply three layer compression as directed. 1. In office sharp debridement 2. Vandaje #5 placed in standard fashion 3. 3 layer compression Electronic Signature(s) Signed: 07/06/2022 10:56:05 AM By: Kalman Shan DO Entered By: Kalman Shan on 07/06/2022 10:53:06 -------------------------------------------------------------------------------- HxROS Details Patient Name: Date of Service: TATTIANA, [name] 07/06/2022 9:15 A M Medical Record Number: [id number] Patient Account Number: [id number] Date of Birth/Sex: Treating RN: [birth year] (88 y.o. [name], [name]: Marda Stalker Other  Clinician: Referring Provider: Treating Provider/Extender: Aviva Signs in Treatment: 31 Information Obtained From Patient Eyes Medical History: Negative for: Cataracts; Glaucoma; Optic Neuritis Ear/Nose/Mouth/Throat Medical History: Negative for: Chronic sinus problems/congestion; Middle ear problems Hematologic/Lymphatic Medical History: Positive for: Anemia Negative for: Hemophilia; Human Immunodeficiency Virus; Lymphedema; Sickle Cell Disease Respiratory Medical History: Negative for: Aspiration; Asthma; Chronic Obstructive Pulmonary Disease (COPD); Pneumothorax; Sleep Apnea; Tuberculosis Cardiovascular Medical History: Positive for: Congestive Heart Failure; Hypertension; Peripheral Venous Disease Negative for: Angina; Arrhythmia; Coronary Artery Disease; Hypotension; Myocardial Infarction; Peripheral Arterial Disease; Phlebitis; Vasculitis Gastrointestinal Medical History: Negative for: Cirrhosis ; Colitis; Crohns; Hepatitis A; Hepatitis B; Hepatitis C Endocrine Medical History: Negative for: Type I Diabetes; Type II Diabetes Genitourinary Medical History: Negative for: End Stage Renal Disease Immunological Medical History: Negative for: Lupus Erythematosus; Raynauds; Scleroderma Integumentary (Skin) Medical History: Negative for: History of Burn Musculoskeletal Medical History: Positive for: Osteoarthritis Negative for: Gout; Rheumatoid Arthritis; Osteomyelitis Neurologic Medical History: Negative for: Neuropathy; Quadriplegia; Paraplegia; Seizure Disorder Oncologic Medical History: Positive for: Received Chemotherapy - Breast and Ovarian Cancer Negative for: Received Radiation Immunizations Pneumococcal Vaccine: Received Pneumococcal Vaccination: Yes Received Pneumococcal Vaccination On or After 60th Birthday: Yes Implantable Devices None Hospitalization / Surgery History Type of  Hospitalization/Surgery diverticulits Family and Social History Cancer: Yes - Child; Diabetes: No; Heart Disease: No; Hereditary Spherocytosis: No;  Hypertension: Yes - Child,Father; Kidney Disease: Yes - Child; Lung Disease: No; Seizures: No; Stroke: Yes - Siblings,Child; Thyroid Problems: Yes - Child; Tuberculosis: No; Never smoker; Marital Status - Widowed; Alcohol Use: Never; Drug Use: No History; Caffeine Use: Never; Financial Concerns: No; Food, Clothing or Shelter Needs: No; Support System Lacking: No; Transportation Concerns: No Electronic Signature(s) Signed: 07/06/2022 10:56:05 AM By: Kalman Shan DO Signed: 07/23/2022 1:11:09 PM By: [name] Entered By: Kalman Shan on 07/06/2022 10:42:03 -------------------------------------------------------------------------------- SuperBill Details Patient Name: Date of Service: [name], [name] 07/06/2022 Medical Record Number: [id number] Patient Account Number: [id number] Date of Birth/Sex: Treating RN: [birth year] (88 y.o. [place]ver Nest[place], [place] Primary Care Provider: Marda Stalker Other Clinician: Referring Provider: Treating Provider/Extender: Aviva Signs in Treatment: 31 Diagnosis Coding ICD-10 Codes Code Description [phone] Chronic venous hypertension (idiopathic) with ulcer of left lower extremity L97.822 Non-pressure chronic ulcer of other part of left lower leg with fat layer exposed C50.919 Malignant neoplasm of unspecified site of unspecified female breast I50.42 Chronic combined systolic (congestive) and diastolic (congestive) heart failure G30.9 Alzheimer's disease, unspecified C54.1 Malignant neoplasm of endometrium Facility Procedures CPT4 Code: [id number] Description: 01314 - DEB SUBQ TISSUE 20 SQ CM/< ICD-10 Diagnosis Description L97.822 Non-pressure chronic ulcer of other part of left lower leg with fat layer expo Modifier: sed Quantity: 1 Physician Procedures : CPT4  Code Description Modifier [id number] - WC PHYS SUBQ TISS 20 SQ CM ICD-10 Diagnosis Description L97.822 Non-pressure chronic ulcer of other part of left lower leg with fat layer exposed Quantity: 1 Electronic Signature(s) Signed: 07/06/2022 10:56:05 AM By: Kalman Shan DO Entered By: Kalman Shan on 07/06/2022 10:53:12

## 2022-07-23 NOTE — Progress Notes (Signed)
[name] ([id number]) Visit Report for 07/20/2022 Chief Complaint Document Details Patient Name: Date of Service: [name], [name] 07/20/2022 10:00 A M Medical Record Number: [id number] Patient Account Number: [id number] Date of Birth/Sex: Treating RN: 06/09/1925 (88 y.o. [name], [name] Primary Care Provider: Marda [name] Other Clinician: Referring Provider: Treating Provider/Extender: [name] Signs in Treatment: 52 Information Obtained from: Patient Chief Complaint Left lower extremity wound Electronic Signature(s) Signed: 07/20/2022 12:41:14 PM By: [name] Shan DO Entered By: [name] on 07/20/2022 12:17:42 -------------------------------------------------------------------------------- HPI Details Patient Name: Date of Service: [name], SEBRING [name] 07/20/2022 10:00 A M Medical Record Number: [id number] Patient Account Number: [id number] Date of Birth/Sex: Treating RN: 1925-02-17 (88 y.o. [name], [name] Primary Care Provider: Marda [name] Other Clinician: Referring Provider: Treating Provider/Extender: [name] Signs in Treatment: 59 History of Present Illness HPI Description: this is a patient we know from several prior wounds on her bilateral lower extremities. She has venous stasis physiology, inflammation and hypertension. She is been fully evaluated for venous ablation and was found not to be a candidate. She does not have significant arterial disease. 02/14/15 the wound itself on her lateral left leg did not look too bad however there was surrounding maceration which was concerning 02/27/15 the wound itself is small with surrounding circumferential epithelialization there is no surrounding maceration. 03/06/15 only a very small area remains. I think this is mostly epithelialized however I would be uncomfortable not dressing this this week 03/13/15 the area is epithelialized. There was a  thick surface on this. I took a #15 blade then lightly disrupted it but there does not appear to be any open area I did not see anything but appropriate epithelium Readmission: 08/16/18 on evaluation today patient presents for initial evaluation and our clinic released readmission although she has not been seen since April 2016. Nonetheless she is having issues with the ulcers currently on the bilateral lateral malleolus or locations as well as the left lateral lower extremity. These have been present for roughly 3 weeks since the patient actually developed significant bilateral lower extremity edema which patient's daughter states was roughly 3 times the size of her legs currently. Subsequently she ended up in the emerging department due to congestive heart failure. They were able to get the swelling under control and fortunately she seems to be doing much better at this point in time. She was placed in an [employer] which she sat on for week part evaluation today as well. Fortunately the wound areas actually seem to be doing fairly well there is some macerated skin surrounding there are the areas/surface of the wound which are gonna require some debridement at this point. No fevers, chills, nausea, or vomiting noted at this time. Patient has a history of hypertension, mild peripheral vascular disease, chronic venous stasis, and other than the ulcer seems to be doing fairly well today. No fevers, chills, nausea, or vomiting noted at this time. 08/23/18 on evaluation today patient actually appears to be doing great in regard to her bilateral lower should be ulcers. She's shown signs of improvement even just with one week with the [employer]. In general I'm actually very pleased with how things appear at this point. 09/06/18 on evaluation today patient actually appears to be doing much better her left lower extremity is completely healed. Go to the right lower extremity lateral malleolus ulcer  this still seems to be showing signs of being open. There does not appear to be evidence  of infection which is good news. This did require some sharp debridement however to remove some of the necrotic tissue/eschar on the surface of the wound Readmission 11/27/2021 [name] is a 88 year old female with a past medical history of endometrial and breast cancer, chronic venous insufficiency, dementia and essential hypertension that presents to the clinic with a 1 month history of nonhealing ulcer to the left lower extremity. She states that the ulcer opened 1 month ago and then healed but has reopened again in the past week. She is currently keeping the area covered. She has a history of wounds to her lower extremities. She has compression stockings that she uses daily. She currently denies signs of infection. 1/13; patient presents for follow-up. She followed up for nurse visit for wrap change. She reports more odor. She denies pain. 1/20; patient presents for follow-up. She has been using gentamicin ointment with calcium alginate to the wound bed. She reports improvement in odor and drainage. She currently denies systemic signs of infection. 1/27; patient presents for follow-up. She has been using gentamicin ointment with calcium alginate. She received her Keystone antibiotic 2 days ago. She brought this in. Home health has also been established. They will come out for the first time next week. She currently denies signs of infection. She has no issues or complaints today. 2/2; patient presents for follow-up. She has been using Keystone antibiotics with [facility] with no issues. She denies signs of infection. She has no issues or complaints today. 2/9; patient presents for follow-up. She has been using Keystone antibiotics with [facility] with no issues. She has tolerated the compression wrap well. She denies signs of infection. 2/16; patient presents for follow-up. She has been  using Keystone antibiotics with Hydrofera Blue under compression wrap with no issues. She currently denies signs of infection. 2/23; patient presents for follow-up. She continues to use Keystone antibiotics with Hydrofera Blue under the compression wrap. She has no issues or complaints today. She denies signs of infection. Home health continues to come out and change the wrap. 3/2; patient presents for follow-up. She continues to use [facility] with [facility] under compression with no issues. She denies signs of infection. 3/16; patient presents for follow-up. She continues to use [facility] with [facility] under compression with no issues. Home health change the dressing last week. She denies signs of infection. 3/23; patient presents for follow-up. She continues to use Keystone antibiotics with Hydrofera Blue under compression with no issues. Home health continues to change the dressings. She denies signs of infection. 3/30; patient presents for follow-up. She continues to use Keystone antibiotics with Hydrofera Blue under compression with no issues. Home health continues to change the dressings. 4/13 2-week follow-up. Keystone and Hydrofera Blue wound is roughly 2.3 cm in diameter smaller. No other issues 4/20; patient presents for follow-up. She continues to use Keystone antibiotic with [facility] with no issues. 4/28; patient with a difficult wound on her right medial lower leg. Using [facility] Blue minimal improvement per our intake nurse we are using compression. She has home health changing the dressing 5/5; patient presents for follow-up. We have been using Keystone antibiotics and Hydrofera Blue under compression therapy. She has no issues or complaints today. We discussed potentially using an advanced tissue product. She states she has had this done in the past and would like to proceed to see what her insurance would cover. 5/15; patient presents for follow-up. She  has been using Keystone antibiotics and  Hydrofera Blue under compression therapy. She forgot her Keystone antibiotics today. Insurance has not approved her for skin substitute. She currently denies signs of infection. She has home health that changes the dressing once a week. 5/19; patient presents for follow-up. We have been using Keystone antibiotic and Hydrofera Blue under compression therapy. She has no issues or complaints today. The [employer] rep confirmed over the phone that the patient has been 100% approved for their skin substitute. We will verify this as we have not received any paperwork stating this. Patient states she would like to proceed with a skin substitute if it is covered by insurance. 6/1; patient presents for follow-up. We have been using Keystone antibiotic and Hydrofera Blue under compression therapy. We confirmed that she does have a co-pay for the [employer] skin substitute. We will hold off on using this for now since she continues to show improvement with current therapy. She denies signs of infection. She has home health that comes out twice weekly. 6/16; patient presents for follow-up. We have been using Keystone antibiotic and Hydrofera Blue under compression therapy. She has no issues or complaints today. We discussed the potential trial of vendaje. This is placenta tissue. She states she would like to try the free trial. We will reach out to our rep. 6/30; patient presents for follow-up. She has been using Keystone antibiotic and Hydrofera Blue under compression therapy. She has been approved for a trial of vendaje. She would like to proceed with this and We will have this available at next clinic visit. She has no issues or complaints today. 7/18; patient presents for follow-up. She has been using Keystone antibiotic and Hydrofera Blue under compression. She has no issues or complaints today. We have a free sample of vendaje skin substitute and patient is agreeable to  having this placed today. 7/24; patient presents for follow-up. We placed the first free trial of vendaje last week under compression wrap. Patient had no issues or complaints today. She tolerated this well. 8/1; patient presents for follow-up. Vandaje #2 donated product was placed in standard fashion last week under compression therapy. She has no issues or complaints today. 8/8; patient presents for follow-up. Vendaje #3 donated skin set was placed in standard fashion last visit under compression therapy. She has no issues or complaints today. 8/15; Patient presents for follow-up. Vandaje #4 was placed in standard fashion last week under compression therapy. She has no issues or complaints today. 8/22; patient presents for follow-up. Vandaje #5 was placed in standard fashion last week under compression therapy. She tolerated this well. 8/29; patient presents for follow-up. We have been using Xeroform under compression therapy and she has tolerated this well. She has no issues or complaints today. She has home health that is been coming out weekly For dressing changes. Electronic Signature(s) Signed: 07/20/2022 12:41:14 PM By: [name] Shan DO Entered By: [name] on 07/20/2022 12:18:18 -------------------------------------------------------------------------------- Physical Exam Details Patient Name: Date of Service: [name], [name] 07/20/2022 10:00 A M Medical Record Number: [id number] Patient Account Number: [id number] Date of Birth/Sex: Treating RN: 1925/09/25 (88 y.o. [name], [name] Primary Care Provider: Marda [name] Other Clinician: Referring Provider: Treating Provider/Extender: Marjo Bicker Weeks in Treatment: 74 Constitutional respirations regular, non-labored and within target range for patient.. Cardiovascular 2+ dorsalis pedis/posterior tibialis pulses. Psychiatric pleasant and cooperative. Notes Left lower extremity: T the  lateral aspect there is an open wound with granulation tissue and epithelization to the edges circumferentially. Good edema control. o Electronic Signature(s)  Signed: 07/20/2022 12:41:14 PM By: [name] Shan DO Entered By: [name] on 07/20/2022 12:19:11 -------------------------------------------------------------------------------- Physician Orders Details Patient Name: Date of Service: KAHLEA, COBERT [name] 07/20/2022 10:00 A M Medical Record Number: [id number] Patient Account Number: [id number] Date of Birth/Sex: Treating RN: 08-Dec-1924 (88 y.o. [name], [name] Primary Care Provider: Marda [name] Other Clinician: Referring Provider: Treating Provider/Extender: [name] Signs in Treatment: 82 Verbal / Phone Orders: No Diagnosis Coding Follow-up Appointments ppointment in 2 weeks. - Tuesday w/ Dr. Heber Adell and Elias Else # 9 Return A Bathing/ Shower/ Hygiene May shower with protection but do not get wound dressing(s) wet. - use cast protector. May shower and wash wound with soap and water. - with dressing changes may wash with soap and water. Edema Control - Lymphedema / SCD / Other Elevate legs to the level of the heart or above for 30 minutes daily and/or when sitting, a frequency of: Avoid standing for long periods of time. Patient to wear own compression stockings every day. - apply in the morning and remove at night to right leg. Exercise regularly Moisturize legs daily. - every night before day right leg. Additional Orders / Instructions Follow Nutritious Diet - High Protein Diet Home Health New wound care orders this week; continue Home Health for wound care. May utilize formulary equivalent dressing for wound treatment orders unless otherwise specified. - Home Health to go out twice the week of 07/26/22 Other Home Health Orders/Instructions: [facility] home health Wound Treatment Wound #26 - Ankle Wound Laterality: Left,  Lateral Cleanser: Soap and Water (Home Health) 2 x Per Week/30 Days Discharge Instructions: May shower and wash wound with dial antibacterial soap and water prior to dressing change. Peri-Wound Care: Sween Lotion (Moisturizing lotion) (Home Health) 2 x Per Week/30 Days Discharge Instructions: Apply moisturizing lotion as directed Prim Dressing: Xeroform Occlusive Gauze Dressing, 4x4 in (Home Health) 2 x Per Week/30 Days ary Discharge Instructions: Apply to wound bed as instructed Secondary Dressing: Zetuvit Plus 4x8 in (Blain) 2 x Per Week/30 Days Discharge Instructions: Apply over primary dressing as directed. Compression Wrap: ThreePress (3 layer compression wrap) (Home Health) 2 x Per Week/30 Days Discharge Instructions: Apply three layer compression as directed. Electronic Signature(s) Signed: 07/20/2022 12:41:14 PM By: [name] Shan DO Entered By: [name] on 07/20/2022 12:19:20 -------------------------------------------------------------------------------- Problem List Details Patient Name: Date of Service: [name], [name] 07/20/2022 10:00 A M Medical Record Number: [id number] Patient Account Number: [id number] Date of Birth/Sex: Treating RN: [birth year] (88 y.o. [name], [name] Primary Care Provider: Marda [name] Other Clinician: Referring Provider: Treating Provider/Extender: [name] Signs in Treatment: 22 Active Problems ICD-10 Encounter Code Description Active Date MDM Diagnosis I87.312 Chronic venous hypertension (idiopathic) with ulcer of left lower extremity 11/27/2021 No Yes L97.822 Non-pressure chronic ulcer of other part of left lower leg with fat layer exposed1/04/2022 No Yes C50.919 Malignant neoplasm of unspecified site of unspecified female breast 11/27/2021 No Yes I50.42 Chronic combined systolic (congestive) and diastolic (congestive) heart failure 11/27/2021 No Yes G30.9 Alzheimer's disease, unspecified  11/27/2021 No Yes C54.1 Malignant neoplasm of endometrium 11/27/2021 No Yes Inactive Problems Resolved Problems Electronic Signature(s) Signed: 07/20/2022 12:41:14 PM By: [name] Shan DO Entered By: [name] on 07/20/2022 12:17:20 -------------------------------------------------------------------------------- Progress Note Details Patient Name: Date of Service: [name], [name] 07/20/2022 10:00 A M Medical Record Number: [id number] Patient Account Number: [id number] Date of Birth/Sex: Treating RN: 1925-01-05 (88 y.o. [name], [name] Primary Care Provider: Marda [name] Other Clinician: Referring Provider: Treating  Provider/Extender: Marjo Bicker Weeks in Treatment: 57 Subjective Chief Complaint Information obtained from Patient Left lower extremity wound History of Present Illness (HPI) this is a patient we know from several prior wounds on her bilateral lower extremities. She has venous stasis physiology, inflammation and hypertension. She is been fully evaluated for venous ablation and was found not to be a candidate. She does not have significant arterial disease. 02/14/15 the wound itself on her lateral left leg did not look too bad however there was surrounding maceration which was concerning 02/27/15 the wound itself is small with surrounding circumferential epithelialization there is no surrounding maceration. 03/06/15 only a very small area remains. I think this is mostly epithelialized however I would be uncomfortable not dressing this this week 03/13/15 the area is epithelialized. There was a thick surface on this. I took a #15 blade then lightly disrupted it but there does not appear to be any open area I did not see anything but appropriate epithelium Readmission: 08/16/18 on evaluation today patient presents for initial evaluation and our clinic released readmission although she has not been seen since April 2016. Nonetheless she is having  issues with the ulcers currently on the bilateral lateral malleolus or locations as well as the left lateral lower extremity. These have been present for roughly 3 weeks since the patient actually developed significant bilateral lower extremity edema which patient's daughter states was roughly 3 times the size of her legs currently. Subsequently she ended up in the emerging department due to congestive heart failure. They were able to get the swelling under control and fortunately she seems to be doing much better at this point in time. She was placed in an [employer] which she sat on for week part evaluation today as well. Fortunately the wound areas actually seem to be doing fairly well there is some macerated skin surrounding there are the areas/surface of the wound which are gonna require some debridement at this point. No fevers, chills, nausea, or vomiting noted at this time. Patient has a history of hypertension, mild peripheral vascular disease, chronic venous stasis, and other than the ulcer seems to be doing fairly well today. No fevers, chills, nausea, or vomiting noted at this time. 08/23/18 on evaluation today patient actually appears to be doing great in regard to her bilateral lower should be ulcers. She's shown signs of improvement even just with one week with the [employer]. In general I'm actually very pleased with how things appear at this point. 09/06/18 on evaluation today patient actually appears to be doing much better her left lower extremity is completely healed. Go to the right lower extremity lateral malleolus ulcer this still seems to be showing signs of being open. There does not appear to be evidence of infection which is good news. This did require some sharp debridement however to remove some of the necrotic tissue/eschar on the surface of the wound Readmission 11/27/2021 Ms. Scotland Dost is a 88 year old female with a past medical history of endometrial and  breast cancer, chronic venous insufficiency, dementia and essential hypertension that presents to the clinic with a 1 month history of nonhealing ulcer to the left lower extremity. She states that the ulcer opened 1 month ago and then healed but has reopened again in the past week. She is currently keeping the area covered. She has a history of wounds to her lower extremities. She has compression stockings that she uses daily. She currently denies signs of infection. 1/13;  patient presents for follow-up. She followed up for nurse visit for wrap change. She reports more odor. She denies pain. 1/20; patient presents for follow-up. She has been using gentamicin ointment with calcium alginate to the wound bed. She reports improvement in odor and drainage. She currently denies systemic signs of infection. 1/27; patient presents for follow-up. She has been using gentamicin ointment with calcium alginate. She received her Keystone antibiotic 2 days ago. She brought this in. Home health has also been established. They will come out for the first time next week. She currently denies signs of infection. She has no issues or complaints today. 2/2; patient presents for follow-up. She has been using Keystone antibiotics with [facility] with no issues. She denies signs of infection. She has no issues or complaints today. 2/9; patient presents for follow-up. She has been using Keystone antibiotics with [facility] with no issues. She has tolerated the compression wrap well. She denies signs of infection. 2/16; patient presents for follow-up. She has been using Keystone antibiotics with Hydrofera Blue under compression wrap with no issues. She currently denies signs of infection. 2/23; patient presents for follow-up. She continues to use Keystone antibiotics with Hydrofera Blue under the compression wrap. She has no issues or complaints today. She denies signs of infection. Home health continues to come out  and change the wrap. 3/2; patient presents for follow-up. She continues to use [facility] with [facility] under compression with no issues. She denies signs of infection. 3/16; patient presents for follow-up. She continues to use [facility] with [facility] under compression with no issues. Home health change the dressing last week. She denies signs of infection. 3/23; patient presents for follow-up. She continues to use Keystone antibiotics with Hydrofera Blue under compression with no issues. Home health continues to change the dressings. She denies signs of infection. 3/30; patient presents for follow-up. She continues to use Keystone antibiotics with Hydrofera Blue under compression with no issues. Home health continues to change the dressings. 4/13 2-week follow-up. Keystone and Hydrofera Blue wound is roughly 2.3 cm in diameter smaller. No other issues 4/20; patient presents for follow-up. She continues to use Keystone antibiotic with [facility] with no issues. 4/28; patient with a difficult wound on her right medial lower leg. Using [facility] Blue minimal improvement per our intake nurse we are using compression. She has home health changing the dressing 5/5; patient presents for follow-up. We have been using Keystone antibiotics and Hydrofera Blue under compression therapy. She has no issues or complaints today. We discussed potentially using an advanced tissue product. She states she has had this done in the past and would like to proceed to see what her insurance would cover. 5/15; patient presents for follow-up. She has been using Keystone antibiotics and Hydrofera Blue under compression therapy. She forgot her Keystone antibiotics today. Insurance has not approved her for skin substitute. She currently denies signs of infection. She has home health that changes the dressing once a week. 5/19; patient presents for follow-up. We have been using Keystone antibiotic and  Hydrofera Blue under compression therapy. She has no issues or complaints today. The [employer] rep confirmed over the phone that the patient has been 100% approved for their skin substitute. We will verify this as we have not received any paperwork stating this. Patient states she would like to proceed with a skin substitute if it is covered by insurance. 6/1; patient presents for follow-up. We have been using Keystone antibiotic and Hydrofera Blue  under compression therapy. We confirmed that she does have a co-pay for the [employer] skin substitute. We will hold off on using this for now since she continues to show improvement with current therapy. She denies signs of infection. She has home health that comes out twice weekly. 6/16; patient presents for follow-up. We have been using Keystone antibiotic and Hydrofera Blue under compression therapy. She has no issues or complaints today. We discussed the potential trial of vendaje. This is placenta tissue. She states she would like to try the free trial. We will reach out to our rep. 6/30; patient presents for follow-up. She has been using Keystone antibiotic and Hydrofera Blue under compression therapy. She has been approved for a trial of vendaje. She would like to proceed with this and We will have this available at next clinic visit. She has no issues or complaints today. 7/18; patient presents for follow-up. She has been using Keystone antibiotic and Hydrofera Blue under compression. She has no issues or complaints today. We have a free sample of vendaje skin substitute and patient is agreeable to having this placed today. 7/24; patient presents for follow-up. We placed the first free trial of vendaje last week under compression wrap. Patient had no issues or complaints today. She tolerated this well. 8/1; patient presents for follow-up. Vandaje #2 donated product was placed in standard fashion last week under compression therapy. She has no issues  or complaints today. 8/8; patient presents for follow-up. Vendaje #3 donated skin set was placed in standard fashion last visit under compression therapy. She has no issues or complaints today. 8/15; Patient presents for follow-up. Vandaje #4 was placed in standard fashion last week under compression therapy. She has no issues or complaints today. 8/22; patient presents for follow-up. Vandaje #5 was placed in standard fashion last week under compression therapy. She tolerated this well. 8/29; patient presents for follow-up. We have been using Xeroform under compression therapy and she has tolerated this well. She has no issues or complaints today. She has home health that is been coming out weekly For dressing changes. Patient History Information obtained from Patient. Family History Cancer - Child, Hypertension - Child,Father, Kidney Disease - Child, Stroke - Siblings,Child, Thyroid Problems - Child, No family history of Diabetes, Heart Disease, Hereditary Spherocytosis, Lung Disease, Seizures, Tuberculosis. Social History Never smoker, Marital Status - Widowed, Alcohol Use - Never, Drug Use - No History, Caffeine Use - Never. Medical History Eyes Denies history of Cataracts, Glaucoma, Optic Neuritis Ear/Nose/Mouth/Throat Denies history of Chronic sinus problems/congestion, Middle ear problems Hematologic/Lymphatic Patient has history of Anemia Denies history of Hemophilia, Human Immunodeficiency Virus, Lymphedema, Sickle Cell Disease Respiratory Denies history of Aspiration, Asthma, Chronic Obstructive Pulmonary Disease (COPD), Pneumothorax, Sleep Apnea, Tuberculosis Cardiovascular Patient has history of Congestive Heart Failure, Hypertension, Peripheral Venous Disease Denies history of Angina, Arrhythmia, Coronary Artery Disease, Hypotension, Myocardial Infarction, Peripheral Arterial Disease, Phlebitis, Vasculitis Gastrointestinal Denies history of Cirrhosis , Colitis, Crohnoos,  Hepatitis A, Hepatitis B, Hepatitis C Endocrine Denies history of Type I Diabetes, Type II Diabetes Genitourinary Denies history of End Stage Renal Disease Immunological Denies history of Lupus Erythematosus, Raynaudoos, Scleroderma Integumentary (Skin) Denies history of History of Burn Musculoskeletal Patient has history of Osteoarthritis Denies history of Gout, Rheumatoid Arthritis, Osteomyelitis Neurologic Denies history of Neuropathy, Quadriplegia, Paraplegia, Seizure Disorder Oncologic Patient has history of Received Chemotherapy - Breast and Ovarian Cancer Denies history of Received Radiation Hospitalization/Surgery History - diverticulits. Objective Constitutional respirations regular, non-labored and within target range for patient.[name]  Vitals Time Taken: 10:14 AM, Height: 66 in, Weight: 176 lbs, BMI: 28.4, Temperature: 98 F, Pulse: 62 bpm, Respiratory Rate: 18 breaths/min, Blood Pressure: 157/76 mmHg. Cardiovascular 2+ dorsalis pedis/posterior tibialis pulses. Psychiatric pleasant and cooperative. General Notes: Left lower extremity: T the lateral aspect there is an open wound with granulation tissue and epithelization to the edges circumferentially. Good o edema control. Integumentary (Hair, Skin) Wound #26 status is Open. Original cause of wound was Gradually Appeared. The date acquired was: 11/20/2021. The wound has been in treatment 33 weeks. The wound is located on the Left,Lateral Ankle. The wound measures 0.7cm length x 0.5cm width x 0.1cm depth; 0.275cm^2 area and 0.027cm^3 volume. There is Fat Layer (Subcutaneous Tissue) exposed. There is no tunneling or undermining noted. There is a medium amount of serosanguineous drainage noted. The wound margin is distinct with the outline attached to the wound base. There is large (67-100%) red granulation within the wound bed. There is no necrotic tissue within the wound bed. 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 I recommended continuing with Xeroform under compression therapy. Patient has home health. We will see her in 2 weeks. Procedures Wound #26 Pre-procedure diagnosis of Wound #26 is a Venous Leg Ulcer located on the Left,Lateral Ankle . There was a Three Layer Compression Therapy Procedure by Rhae Hammock, RN. Post procedure Diagnosis Wound #26: Same as Pre-Procedure Plan Follow-up Appointments: Return Appointment in 2 weeks. - Tuesday w/ Dr. Heber Frederick and Elias Else # 9 Bathing/ Shower/ Hygiene: May shower with protection but do not get wound dressing(s) wet. - use cast protector. May shower and wash wound with soap and water. - with dressing changes may wash with soap and water. Edema Control - Lymphedema / SCD / Other: Elevate legs to the level of the heart or above for 30 minutes daily and/or when sitting, a frequency of: Avoid standing for long periods of time. Patient to wear own compression stockings every day. - apply in the morning and remove at night to right leg. Exercise regularly Moisturize legs daily. - every night before day right leg. Additional Orders / Instructions: Follow Nutritious Diet - High Protein Diet Home Health: New wound care orders this week; continue Home Health for wound care. May utilize formulary equivalent dressing for wound treatment orders unless otherwise specified. - Home Health to go out twice the week of 07/26/22 Other Home Health Orders/Instructions: [facility] WOUND #26: - Ankle Wound  Laterality: Left, Lateral Cleanser: Soap and Water (Home Health) 2 x Per Week/30 Days Discharge Instructions: May shower and wash wound with dial antibacterial soap and water prior to dressing change. Peri-Wound Care: Sween Lotion (Moisturizing lotion) (Home Health) 2 x Per Week/30 Days Discharge Instructions: Apply moisturizing lotion as directed Prim Dressing: Xeroform Occlusive Gauze Dressing, 4x4 in (Home Health) 2 x Per Week/30 Days ary Discharge Instructions: Apply to wound bed as instructed Secondary Dressing: Zetuvit Plus 4x8 in ([place]) 2 x Per Week/30 Days Discharge Instructions: Apply over primary dressing as directed. Com pression Wrap: ThreePress (3 layer compression wrap) (Home Health) 2 x Per Week/30 Days Discharge Instructions: Apply three layer compression as directed. 1.  Xeroform under 3 layer compression 2. Follow-up in 2 weeks Electronic Signature(s) Signed: 07/20/2022 12:41:14 PM By: [name] Shan DO Entered By: [name] on 07/20/2022 12:20:16 -------------------------------------------------------------------------------- HxROS Details Patient Name: Date of Service: [name], [name] 07/20/2022 10:00 A M Medical Record Number: [id number] Patient Account Number: [id number] Date of Birth/Sex: Treating RN: 09/30/1925 (88 y.o. [name], [name] Primary Care Provider: Marda [name] Other Clinician: Referring Provider: Treating Provider/Extender: [name] Signs in Treatment: 71 Information Obtained From Patient Eyes Medical History: Negative for: Cataracts; Glaucoma; Optic Neuritis Ear/Nose/Mouth/Throat Medical History: Negative for: Chronic sinus problems/congestion; Middle ear problems Hematologic/Lymphatic Medical History: Positive for: Anemia Negative for: Hemophilia; Human Immunodeficiency Virus; Lymphedema; Sickle Cell Disease Respiratory Medical History: Negative for: Aspiration; Asthma; Chronic  Obstructive Pulmonary Disease (COPD); Pneumothorax; Sleep Apnea; Tuberculosis Cardiovascular Medical History: Positive for: Congestive Heart Failure; Hypertension; Peripheral Venous Disease Negative for: Angina; Arrhythmia; Coronary Artery Disease; Hypotension; Myocardial Infarction; Peripheral Arterial Disease; Phlebitis; Vasculitis Gastrointestinal Medical History: Negative for: Cirrhosis ; Colitis; Crohns; Hepatitis A; Hepatitis B; Hepatitis C Endocrine Medical History: Negative for: Type I Diabetes; Type II Diabetes Genitourinary Medical History: Negative for: End Stage Renal Disease Immunological Medical History: Negative for: Lupus Erythematosus; Raynauds; Scleroderma Integumentary (Skin) Medical History: Negative for: History of Burn Musculoskeletal Medical History: Positive for: Osteoarthritis Negative for: Gout; Rheumatoid Arthritis; Osteomyelitis Neurologic Medical History: Negative for: Neuropathy; Quadriplegia; Paraplegia; Seizure Disorder Oncologic Medical History: Positive for: Received Chemotherapy - Breast and Ovarian Cancer Negative for: Received Radiation Immunizations Pneumococcal Vaccine: Received Pneumococcal Vaccination: Yes Received Pneumococcal Vaccination On or After 60th Birthday: Yes Implantable Devices None Hospitalization / Surgery History Type of Hospitalization/Surgery diverticulits Family and Social History Cancer: Yes - Child; Diabetes: No; Heart Disease: No; Hereditary Spherocytosis: No; Hypertension: Yes - Child,Father; Kidney Disease: Yes - Child; Lung Disease: No; Seizures: No; Stroke: Yes - Siblings,Child; Thyroid Problems: Yes - Child; Tuberculosis: No; Never smoker; Marital Status - Widowed; Alcohol Use: Never; Drug Use: No History; Caffeine Use: Never; Financial Concerns: No; Food, Clothing or Shelter Needs: No; Support System Lacking: No; Transportation Concerns: No Electronic Signature(s) Signed: 07/20/2022 12:41:14 PM By:  [name] Shan DO Signed: 07/23/2022 1:10:23 PM By: [name] Entered By: [name] on 07/20/2022 12:18:24 -------------------------------------------------------------------------------- SuperBill Details Patient Name: Date of Service: [name], [name] 07/20/2022 Medical Record Number: [id number] Patient Account Number: [id number] Date of Birth/Sex: Treating RN: 1925-09-15 (88 y.o. [name], [name] Primary Care Provider: Marda [name] Other Clinician: Referring Provider: Treating Provider/Extender: [name] Signs in Treatment: 33 Diagnosis Coding ICD-10 Codes Code Description [phone] Chronic venous hypertension (idiopathic) with ulcer of left lower extremity L97.822 Non-pressure chronic ulcer of other part of left lower leg with fat layer exposed C50.919 Malignant neoplasm of unspecified site of unspecified female breast I50.42 Chronic combined systolic (congestive) and diastolic (congestive) heart failure G30.9 Alzheimer's disease, unspecified C54.1 Malignant neoplasm of endometrium Facility Procedures CPT4 Code: [id number] Description: (Facility Use Only) [phone] - [place] [facility] LEG Modifier: Quantity: 1 Physician Procedures : CPT4 Code Description Modifier [id number] - WC PHYS LEVEL 3 - EST PT ICD-10 Diagnosis Description I87.312 Chronic venous hypertension (idiopathic) with ulcer of left lower extremity L97.822 Non-pressure chronic ulcer of other part of left lower leg  with fat layer exposed I50.42 Chronic combined systolic (congestive) and diastolic (congestive) heart failure G30.9 Alzheimer's disease, unspecified Quantity: 1 Electronic Signature(s) Signed: 07/20/2022 12:41:14 PM By: [name] Shan DO Entered By: [name] on 07/20/2022 12:20:56

## 2022-07-23 NOTE — Progress Notes (Signed)
CRYSTALMARIE, YASIN ([id number]) Visit Report for 07/06/2022 Arrival Information Details Patient Name: Date of Service: [name], [name] 07/06/2022 9:15 A M Medical Record Number: [id number] Patient Account Number: [id number] Date of Birth/Sex: Treating RN: [birth year] (88 y.o. [name], [name] Primary Care Monteen Toops: Marda Stalker Other Clinician: Referring Shavaun Osterloh: Treating Yona Kosek/Extender: Aviva Signs in Treatment: 23 Visit Information History Since Last Visit All ordered tests and consults were completed: Yes Patient Arrived: Wheel Chair Added or deleted any medications: No Arrival Time: 09:24 Any new allergies or adverse reactions: No Accompanied By: daughter Had a fall or experienced change in No Transfer Assistance: None activities of daily living that may affect Patient Identification Verified: Yes risk of falls: Patient Requires Transmission-Based Precautions: No Signs or symptoms of abuse/neglect since last visito No Patient Has Alerts: No Implantable device outside of the clinic excluding No cellular tissue based products placed in the center since last visit: Has Dressing in Place as Prescribed: Yes Pain Present Now: No Electronic Signature(s) Signed: 07/08/2022 4:47:28 PM By: Blanche East RN Entered By: Blanche East on 07/06/2022 09:27:13 -------------------------------------------------------------------------------- Encounter Discharge Information Details Patient Name: Date of Service: [name], [name] 07/06/2022 9:15 A M Medical Record Number: [id number] Patient Account Number: [id number] Date of Birth/Sex: Treating RN: 10/31/1925 (88 y.o. Marta Lamas Primary Care Solly Derasmo: Marda Stalker Other Clinician: Referring Ziere Docken: Treating Kora Groom/Extender: Aviva Signs in Treatment: 45 Encounter Discharge Information Items Post Procedure Vitals Discharge Condition: Stable Temperature  (F): 97.5 Ambulatory Status: Wheelchair Pulse (bpm): 63 Discharge Destination: Home Respiratory Rate (breaths/min): 17 Transportation: Private Auto Blood Pressure (mmHg): 149/73 Accompanied By: daughter Schedule Follow-up Appointment: Yes Clinical Summary of Care: Electronic Signature(s) Signed: 07/08/2022 4:47:28 PM By: Blanche East RN Entered By: Blanche East on 07/06/2022 10:10:38 -------------------------------------------------------------------------------- Lower Extremity Assessment Details Patient Name: Date of Service: [name], [name] 07/06/2022 9:15 A M Medical Record Number: [id number] Patient Account Number: [id number] Date of Birth/Sex: Treating RN: July 11, 1925 (88 y.o. [name], [name] Primary Care Karine Garn: Marda Stalker Other Clinician: Referring Laithan Conchas: Treating Jasha Hodzic/Extender: Marjo Bicker Weeks in Treatment: 31 Edema Assessment Assessed: [name]: Yes] Patrice Paradise: No] Edema: [Left: N] [Right: o] Calf Left: Right: Point of Measurement: 30 cm From Medial Instep 31 cm Ankle Left: Right: Point of Measurement: 11 cm From Medial Instep 22.5 cm Vascular Assessment Pulses: Dorsalis Pedis Palpable: [Left:Yes] Electronic Signature(s) Signed: 07/08/2022 4:47:28 PM By: Blanche East RN Signed: 07/23/2022 1:11:09 PM By: [name] Entered By: Blanche East on 07/06/2022 09:36:37 -------------------------------------------------------------------------------- Multi Wound Chart Details Patient Name: Date of Service: [name], [name] 07/06/2022 9:15 A M Medical Record Number: [id number] Patient Account Number: [id number] Date of Birth/Sex: Treating RN: 08-28-1925 (88 y.o. [name], [name] Primary Care Othelia Riederer: Marda Stalker Other Clinician: Referring Elvena Oyer: Treating Aaliyan Brinkmeier/Extender: Aviva Signs in Treatment: 31 Vital Signs Height(in): 66 Pulse(bpm): 69 Weight(lbs): 176 Blood  Pressure(mmHg): 149/73 Body Mass Index(BMI): 28.4 Temperature(F): 97.5 Respiratory Rate(breaths/min): 17 Photos: [26:No Photos Left, Lateral Ankle] [N/A:N/A Left, Lateral Ankle N/A] Wound Location: [26:Gradually Appeared] [N/A:Gradually Appeared N/A] Wounding Event: [26:Venous Leg Ulcer] [N/A:Venous Leg Ulcer N/A] Primary Etiology: [26:Anemia, Congestive Heart Failure,] [N/A:Anemia, Congestive Heart Failure, N/A] Comorbid History: [26:Hypertension, Peripheral Venous Disease, Osteoarthritis, Received Chemotherapy 11/20/2021] [N/A:Hypertension, Peripheral Venous Disease, Osteoarthritis, Received Chemotherapy 11/20/2021 N/A] Date Acquired: [26:31] [N/A:31 N/A] Weeks of Treatment: [26:Open] [N/A:Open N/A] Wound Status: [26:No] [N/A:No N/A] Wound Recurrence: [26:1.4x0.8x0.1] [N/A:1.4x0.8x0.1 N/A] Measurements L x W x D (cm) [26:0.88] [N/A:0.88 N/A] A (cm) : rea [82:4.235] [N/A:0.088 N/A]  Volume (cm) : [26:90.70%] [N/A:90.70% N/A] % Reduction in A [26:rea: 90.70%] [N/A:90.70% N/A] % Reduction in Volume: [26:Full Thickness Without Exposed] [N/A:Full Thickness Without Exposed N/A] Classification: [26:Support Structures Medium] [N/A:Support Structures Medium N/A] Exudate A mount: [26:Serosanguineous] [N/A:Serosanguineous N/A] Exudate Type: [26:red, brown] [N/A:red, brown N/A] Exudate Color: [26:Distinct, outline attached] [N/A:Distinct, outline attached N/A] Wound Margin: [26:Large (67-100%)] [N/A:Large (67-100%) N/A] Granulation A mount: [26:Red] [N/A:Red N/A] Granulation Quality: [26:Small (1-33%)] [N/A:Small (1-33%) N/A] Necrotic A mount: [26:Fat Layer (Subcutaneous Tissue): Yes Fat Layer (Subcutaneous Tissue): Yes N/A] Exposed Structures: [26:Fascia: No Tendon: No Muscle: No Joint: No Bone: No Medium (34-66%)] [N/A:Fascia: No Tendon: No Muscle: No Joint: No Bone: No Medium (34-66%) N/A] Epithelialization: [26:Debridement - Excisional] [N/A:Debridement - Excisional  N/A] Debridement: Pre-procedure Verification/Time Out 09:50 [N/A:09:50 N/A] Taken: [26:Lidocaine 5% topical ointment] [N/A:Lidocaine 5% topical ointment N/A] Pain Control: [26:Subcutaneous, Slough] [N/A:Subcutaneous, Slough N/A] Tissue Debrided: [26:Skin/Subcutaneous Tissue] [N/A:Skin/Subcutaneous Tissue N/A] Level: [26:1.12] [N/A:1.12 N/A] Debridement A (sq cm): [26:rea Curette] [N/A:Curette N/A] Instrument: [26:Minimum] [N/A:Minimum N/A] Bleeding: [26:Pressure] [N/A:Pressure N/A] Hemostasis A chieved: [26:0] [N/A:0 N/A] Procedural Pain: [26:0] [N/A:0 N/A] Post Procedural Pain: [26:Procedure was tolerated well] [N/A:Procedure was tolerated well N/A] Debridement Treatment Response: [26:1.4x0.8x0.1] [N/A:1.4x0.8x0.1 N/A] Post Debridement Measurements L x W x D (cm) [26:0.088] [N/A:0.088 N/A] Post Debridement Volume: (cm) [26:Cellular or Tissue Based Product] [N/A:Cellular or Tissue Based Product N/A] Procedures Performed: [26:Debridement] [N/A:Debridement] Treatment Notes Wound #26 (Ankle) Wound Laterality: Left, Lateral Cleanser Soap and Water Discharge Instruction: May shower and wash wound with dial antibacterial soap and water prior to dressing change. Peri-Wound Care Sween Lotion (Moisturizing lotion) Discharge Instruction: Apply moisturizing lotion as directed Topical Primary Dressing [place], [place], [place] Discharge Instruction: applied directly to wound bed by Hillari Zumwalt. Secondary Dressing Zetuvit Plus 4x8 in Discharge Instruction: Apply over primary dressing as directed. Secured With Compression Wrap ThreePress (3 layer compression wrap) Discharge Instruction: Apply three layer compression as directed. Compression Stockings Add-Ons Electronic Signature(s) Signed: 07/06/2022 10:56:05 AM By: Kalman Shan DO Signed: 07/23/2022 1:11:09 PM By: [name] Entered By: Kalman Shan on 07/06/2022  10:41:17 -------------------------------------------------------------------------------- Multi-Disciplinary Care Plan Details Patient Name: Date of Service: [name], [name] 07/06/2022 9:15 A M Medical Record Number: [id number] Patient Account Number: [id number] Date of Birth/Sex: Treating RN: [birth year] (88 y.o. [name], [name] Primary Care Shawndale Kilpatrick: Marda Stalker Other Clinician: Referring Braelin Costlow: Treating Ziggy Reveles/Extender: Aviva Signs in Treatment: 38 Active Inactive Nutrition Nursing Diagnoses: Imbalanced nutrition Goals: Patient/caregiver agrees to and verbalizes understanding of need to use nutritional supplements and/or vitamins as prescribed Date Initiated: 11/27/2021 Target Resolution Date: 07/23/2022 Goal Status: Active Interventions: Assess patient nutrition upon admission and as needed per policy Provide education on nutrition Treatment Activities: Education provided on Nutrition : 06/29/2022 Notes: 06/22/2022 #3 vendaje advance tissue product applied by product, compression continued. Electronic Signature(s) Signed: 07/08/2022 4:47:28 PM By: Blanche East RN Signed: 07/23/2022 1:11:09 PM By: [name] Entered By: Blanche East on 07/06/2022 09:40:57 -------------------------------------------------------------------------------- Pain Assessment Details Patient Name: Date of Service: [name], [name] 07/06/2022 9:15 A M Medical Record Number: [id number] Patient Account Number: [id number] Date of Birth/Sex: Treating RN: 02-Oct-1925 (88 y.o. [name], [name] Primary Care Lorin Gawron: Marda Stalker Other Clinician: Referring Esau Fridman: Treating Levell Tavano/Extender: Aviva Signs in Treatment: 31 Active Problems Location of Pain Severity and Description of Pain Patient Has Paino No Patient Has Paino No Site Locations Pain Management and Medication Current Pain Management: Electronic  Signature(s) Signed: 07/08/2022 4:47:28 PM By: Blanche East RN  Signed: 07/23/2022 1:11:09 PM By: [name] Entered By: Blanche East on 07/06/2022 09:36:20 -------------------------------------------------------------------------------- Patient/Caregiver Education Details Patient Name: Date of Service: Thalia Party [name] 8/15/2023andnbsp9:15 A M Medical Record Number: [id number] Patient Account Number: [id number] Date of Birth/Gender: Treating RN: 1925-04-22 (88 y.o. [name], [name] Primary Care Physician: Marda Stalker Other Clinician: Referring Physician: Treating Physician/Extender: Aviva Signs in Treatment: 69 Education Assessment Education Provided To: Patient Education Topics Provided Nutrition: Methods: Explain/Verbal Responses: Reinforcements needed, State content correctly Electronic Signature(s) Signed: 07/08/2022 4:47:28 PM By: Blanche East RN Entered By: Blanche East on 07/06/2022 09:41:16 -------------------------------------------------------------------------------- Wound Assessment Details Patient Name: Date of Service: [name], [name] 07/06/2022 9:15 A M Medical Record Number: [id number] Patient Account Number: [id number] Date of Birth/Sex: Treating RN: 1924/12/16 (88 y.o. [name], [name] Primary Care Lurleen Soltero: Other Clinician: Marda Stalker Referring Freeland Pracht: Treating Anastasia Tompson/Extender: Marjo Bicker Weeks in Treatment: 31 Wound Status Wound Number: 26 Primary Venous Leg Ulcer Etiology: Wound Location: Left, Lateral Ankle Wound Open Wounding Event: Gradually Appeared Status: Date Acquired: 11/20/2021 Comorbid Anemia, Congestive Heart Failure, Hypertension, Peripheral Weeks Of Treatment: 31 History: Venous Disease, Osteoarthritis, Received Chemotherapy Clustered Wound: No Wound Measurements Length: (cm) 1.4 Width: (cm) 0.8 Depth: (cm) 0.1 Area: (cm) 0.88 Volume:  (cm) 0.088 % Reduction in Area: 90.7% % Reduction in Volume: 90.7% Epithelialization: Medium (34-66%) Tunneling: No Undermining: No Wound Description Classification: Full Thickness Without Exposed Support Structures Wound Margin: Distinct, outline attached Exudate Amount: Medium Exudate Type: Serosanguineous Exudate Color: red, brown Foul Odor After Cleansing: No Slough/Fibrino Yes Wound Bed Granulation Amount: Large (67-100%) Exposed Structure Granulation Quality: Red Fascia Exposed: No Necrotic Amount: Small (1-33%) Fat Layer (Subcutaneous Tissue) Exposed: Yes Necrotic Quality: Adherent Slough Tendon Exposed: No Muscle Exposed: No Joint Exposed: No Bone Exposed: No Electronic Signature(s) Signed: 07/08/2022 4:47:28 PM By: Blanche East RN Signed: 07/23/2022 1:11:09 PM By: [name] Entered By: Blanche East on 07/06/2022 09:39:58 -------------------------------------------------------------------------------- Wound Assessment Details Patient Name: Date of Service: [name], [name] 07/06/2022 9:15 A M Medical Record Number: [id number] Patient Account Number: [id number] Date of Birth/Sex: Treating RN: Dec 18, 1924 (88 y.o. [name], [name] Primary Care Tanette Chauca: Marda Stalker Other Clinician: Referring Lamona Eimer: Treating Tanesha Arambula/Extender: Marjo Bicker Weeks in Treatment: 31 Wound Status Wound Number: 26 Primary Venous Leg Ulcer Etiology: Wound Location: Left, Lateral Ankle Wound Open Wounding Event: Gradually Appeared Status: Date Acquired: 11/20/2021 Comorbid Anemia, Congestive Heart Failure, Hypertension, Peripheral Weeks Of Treatment: 31 History: Venous Disease, Osteoarthritis, Received Chemotherapy Clustered Wound: No Photos Wound Measurements Length: (cm) 1.4 Width: (cm) 0.8 Depth: (cm) 0.1 Area: (cm) 0.88 Volume: (cm) 0.088 % Reduction in Area: 90.7% % Reduction in Volume: 90.7% Epithelialization: Medium  (34-66%) Wound Description Classification: Full Thickness Without Exposed Support Structures Wound Margin: Distinct, outline attached Exudate Amount: Medium Exudate Type: Serosanguineous Exudate Color: red, brown Foul Odor After Cleansing: No Slough/Fibrino Yes Wound Bed Granulation Amount: Large (67-100%) Exposed Structure Granulation Quality: Red Fascia Exposed: No Necrotic Amount: Small (1-33%) Fat Layer (Subcutaneous Tissue) Exposed: Yes Necrotic Quality: Adherent Slough Tendon Exposed: No Muscle Exposed: No Joint Exposed: No Bone Exposed: No Electronic Signature(s) Signed: 07/06/2022 4:22:40 PM By: Deon Pilling RN, BSN Signed: 07/23/2022 1:11:09 PM By: [name] Entered By: Deon Pilling on 07/06/2022 09:40:41 -------------------------------------------------------------------------------- Vitals Details Patient Name: Date of Service: [name], [name] 07/06/2022 9:15 A M Medical Record Number: [id number] Patient Account Number: [id number] Date of Birth/Sex: Treating RN: [birth year] (88 y.o. [name], [place] Primary Care Kila Godina: Marda Stalker  Other Clinician: Referring Lamir Racca: Treating Srihitha Tagliaferri/Extender: Aviva Signs in Treatment: 31 Vital Signs Time Taken: 09:27 Temperature (F): 97.5 Height (in): 66 Pulse (bpm): 63 Weight (lbs): 176 Respiratory Rate (breaths/min): 17 Body Mass Index (BMI): 28.4 Blood Pressure (mmHg): 149/73 Reference Range: 80 - 120 mg / dl Electronic Signature(s) Signed: 07/08/2022 4:47:28 PM By: Blanche East RN Entered By: Blanche East on 07/06/2022 09:28:36

## 2022-08-03 ENCOUNTER — Encounter (HOSPITAL_BASED_OUTPATIENT_CLINIC_OR_DEPARTMENT_OTHER): Payer: Medicare PPO | Attending: Internal Medicine | Admitting: Internal Medicine

## 2022-08-03 DIAGNOSIS — I87312 Chronic venous hypertension (idiopathic) with ulcer of left lower extremity: Secondary | ICD-10-CM | POA: Diagnosis not present

## 2022-08-03 DIAGNOSIS — Z8542 Personal history of malignant neoplasm of other parts of uterus: Secondary | ICD-10-CM | POA: Insufficient documentation

## 2022-08-03 DIAGNOSIS — L97822 Non-pressure chronic ulcer of other part of left lower leg with fat layer exposed: Secondary | ICD-10-CM | POA: Diagnosis not present

## 2022-08-03 DIAGNOSIS — G309 Alzheimer's disease, unspecified: Secondary | ICD-10-CM | POA: Diagnosis not present

## 2022-08-03 DIAGNOSIS — I110 Hypertensive heart disease with heart failure: Secondary | ICD-10-CM | POA: Insufficient documentation

## 2022-08-03 DIAGNOSIS — I5042 Chronic combined systolic (congestive) and diastolic (congestive) heart failure: Secondary | ICD-10-CM

## 2022-08-03 DIAGNOSIS — F0280 Dementia in other diseases classified elsewhere without behavioral disturbance: Secondary | ICD-10-CM | POA: Diagnosis not present

## 2022-08-03 DIAGNOSIS — Z853 Personal history of malignant neoplasm of breast: Secondary | ICD-10-CM | POA: Diagnosis not present

## 2022-08-03 NOTE — Progress Notes (Signed)
ALESANDRA, SMART ([id number]) Visit Report for 08/03/2022 Chief Complaint Document Details Patient Name: Date of Service: TALOR, DESROSIERS 08/03/2022 10:00 A M Medical Record Number: [id number] Patient Account Number: [id number] Date of Birth/Sex: Treating RN: Jan 21, 1925 (88 y.o. F) Primary Care Provider: Marda Stalker Other Clinician: Referring Provider: Treating Provider/Extender: Aviva Signs in Treatment: 35 Information Obtained from: Patient Chief Complaint Left lower extremity wound Electronic Signature(s) Signed: 08/03/2022 11:20:59 AM By: Kalman Shan DO Entered By: Kalman Shan on 08/03/2022 10:51:56 -------------------------------------------------------------------------------- HPI Details Patient Name: Date of Service: LORANA, MAFFEO RNESTINE 08/03/2022 10:00 A M Medical Record Number: [id number] Patient Account Number: [id number] Date of Birth/Sex: Treating RN: Feb 07, 1925 (88 y.o. F) Primary Care Provider: Marda Stalker Other Clinician: Referring Provider: Treating Provider/Extender: Aviva Signs in Treatment: 15 History of Present Illness HPI Description: this is a patient we know from several prior wounds on her bilateral lower extremities. She has venous stasis physiology, inflammation and hypertension. She is been fully evaluated for venous ablation and was found not to be a candidate. She does not have significant arterial disease. 02/14/15 the wound itself on her lateral left leg did not look too bad however there was surrounding maceration which was concerning 02/27/15 the wound itself is small with surrounding circumferential epithelialization there is no surrounding maceration. 03/06/15 only a very small area remains. I think this is mostly epithelialized however I would be uncomfortable not dressing this this week 03/13/15 the area is epithelialized. There was a thick surface on this. I took a #15  blade then lightly disrupted it but there does not appear to be any open area I did not see anything but appropriate epithelium Readmission: 08/16/18 on evaluation today patient presents for initial evaluation and our clinic released readmission although she has not been seen since April 2016. Nonetheless she is having issues with the ulcers currently on the bilateral lateral malleolus or locations as well as the left lateral lower extremity. These have been present for roughly 3 weeks since the patient actually developed significant bilateral lower extremity edema which patient's daughter states was roughly 3 times the size of her legs currently. Subsequently she ended up in the emerging department due to congestive heart failure. They were able to get the swelling under control and fortunately she seems to be doing much better at this point in time. She was placed in an [employer] which she sat on for week part evaluation today as well. Fortunately the wound areas actually seem to be doing fairly well there is some macerated skin surrounding there are the areas/surface of the wound which are gonna require some debridement at this point. No fevers, chills, nausea, or vomiting noted at this time. Patient has a history of hypertension, mild peripheral vascular disease, chronic venous stasis, and other than the ulcer seems to be doing fairly well today. No fevers, chills, nausea, or vomiting noted at this time. 08/23/18 on evaluation today patient actually appears to be doing great in regard to her bilateral lower should be ulcers. She's shown signs of improvement even just with one week with the [employer]. In general I'm actually very pleased with how things appear at this point. 09/06/18 on evaluation today patient actually appears to be doing much better her left lower extremity is completely healed. Go to the right lower extremity lateral malleolus ulcer this still seems to be showing signs  of being open. There does not appear to be evidence of infection which is  good news. This did require some sharp debridement however to remove some of the necrotic tissue/eschar on the surface of the wound Readmission 11/27/2021 Ms. Krisann Mckenna is a 88 year old female with a past medical history of endometrial and breast cancer, chronic venous insufficiency, dementia and essential hypertension that presents to the clinic with a 1 month history of nonhealing ulcer to the left lower extremity. She states that the ulcer opened 1 month ago and then healed but has reopened again in the past week. She is currently keeping the area covered. She has a history of wounds to her lower extremities. She has compression stockings that she uses daily. She currently denies signs of infection. 1/13; patient presents for follow-up. She followed up for nurse visit for wrap change. She reports more odor. She denies pain. 1/20; patient presents for follow-up. She has been using gentamicin ointment with calcium alginate to the wound bed. She reports improvement in odor and drainage. She currently denies systemic signs of infection. 1/27; patient presents for follow-up. She has been using gentamicin ointment with calcium alginate. She received her Keystone antibiotic 2 days ago. She brought this in. Home health has also been established. They will come out for the first time next week. She currently denies signs of infection. She has no issues or complaints today. 2/2; patient presents for follow-up. She has been using Keystone antibiotics with [facility] with no issues. She denies signs of infection. She has no issues or complaints today. 2/9; patient presents for follow-up. She has been using Keystone antibiotics with [facility] with no issues. She has tolerated the compression wrap well. She denies signs of infection. 2/16; patient presents for follow-up. She has been using Keystone antibiotics with  Hydrofera Blue under compression wrap with no issues. She currently denies signs of infection. 2/23; patient presents for follow-up. She continues to use Keystone antibiotics with Hydrofera Blue under the compression wrap. She has no issues or complaints today. She denies signs of infection. Home health continues to come out and change the wrap. 3/2; patient presents for follow-up. She continues to use [facility] with [facility] under compression with no issues. She denies signs of infection. 3/16; patient presents for follow-up. She continues to use [facility] with [facility] under compression with no issues. Home health change the dressing last week. She denies signs of infection. 3/23; patient presents for follow-up. She continues to use Keystone antibiotics with Hydrofera Blue under compression with no issues. Home health continues to change the dressings. She denies signs of infection. 3/30; patient presents for follow-up. She continues to use Keystone antibiotics with Hydrofera Blue under compression with no issues. Home health continues to change the dressings. 4/13 2-week follow-up. Keystone and Hydrofera Blue wound is roughly 2.3 cm in diameter smaller. No other issues 4/20; patient presents for follow-up. She continues to use Keystone antibiotic with [facility] with no issues. 4/28; patient with a difficult wound on her right medial lower leg. Using [facility] Blue minimal improvement per our intake nurse we are using compression. She has home health changing the dressing 5/5; patient presents for follow-up. We have been using Keystone antibiotics and Hydrofera Blue under compression therapy. She has no issues or complaints today. We discussed potentially using an advanced tissue product. She states she has had this done in the past and would like to proceed to see what her insurance would cover. 5/15; patient presents for follow-up. She has been using Keystone  antibiotics and Hydrofera Blue under compression  therapy. She forgot her Keystone antibiotics today. Insurance has not approved her for skin substitute. She currently denies signs of infection. She has home health that changes the dressing once a week. 5/19; patient presents for follow-up. We have been using Keystone antibiotic and Hydrofera Blue under compression therapy. She has no issues or complaints today. The [employer] rep confirmed over the phone that the patient has been 100% approved for their skin substitute. We will verify this as we have not received any paperwork stating this. Patient states she would like to proceed with a skin substitute if it is covered by insurance. 6/1; patient presents for follow-up. We have been using Keystone antibiotic and Hydrofera Blue under compression therapy. We confirmed that she does have a co-pay for the [employer] skin substitute. We will hold off on using this for now since she continues to show improvement with current therapy. She denies signs of infection. She has home health that comes out twice weekly. 6/16; patient presents for follow-up. We have been using Keystone antibiotic and Hydrofera Blue under compression therapy. She has no issues or complaints today. We discussed the potential trial of vendaje. This is placenta tissue. She states she would like to try the free trial. We will reach out to our rep. 6/30; patient presents for follow-up. She has been using Keystone antibiotic and Hydrofera Blue under compression therapy. She has been approved for a trial of vendaje. She would like to proceed with this and We will have this available at next clinic visit. She has no issues or complaints today. 7/18; patient presents for follow-up. She has been using Keystone antibiotic and Hydrofera Blue under compression. She has no issues or complaints today. We have a free sample of vendaje skin substitute and patient is agreeable to having this placed  today. 7/24; patient presents for follow-up. We placed the first free trial of vendaje last week under compression wrap. Patient had no issues or complaints today. She tolerated this well. 8/1; patient presents for follow-up. Vandaje #2 donated product was placed in standard fashion last week under compression therapy. She has no issues or complaints today. 8/8; patient presents for follow-up. Vendaje #3 donated skin set was placed in standard fashion last visit under compression therapy. She has no issues or complaints today. 8/15; Patient presents for follow-up. Vandaje #4 was placed in standard fashion last week under compression therapy. She has no issues or complaints today. 8/22; patient presents for follow-up. Vandaje #5 was placed in standard fashion last week under compression therapy. She tolerated this well. 8/29; patient presents for follow-up. We have been using Xeroform under compression therapy and she has tolerated this well. She has no issues or complaints today. She has home health that is been coming out weekly For dressing changes. 9/12; patient presents for follow-up. We have been using Xeroform under compression therapy. Home health has been changing the dressings. She has no issues or complaints today. Electronic Signature(s) Signed: 08/03/2022 11:20:59 AM By: Kalman Shan DO Entered By: Kalman Shan on 08/03/2022 10:52:27 -------------------------------------------------------------------------------- Physical Exam Details Patient Name: Date of Service: REMONA, BOOM RNESTINE 08/03/2022 10:00 A M Medical Record Number: [id number] Patient Account Number: [id number] Date of Birth/Sex: Treating RN: 1925-05-21 (88 y.o. F) Primary Care Provider: Marda Stalker Other Clinician: Referring Provider: Treating Provider/Extender: Marjo Bicker Weeks in Treatment: 35 Constitutional respirations regular, non-labored and within target range for  patient.. Cardiovascular 2+ dorsalis pedis/posterior tibialis pulses. Psychiatric pleasant and cooperative. Notes Left lower extremity: T the lateral  aspect there is an open wound with granulation tissue and epithelization to the edges circumferentially. Good edema control. o Electronic Signature(s) Signed: 08/03/2022 11:20:59 AM By: Kalman Shan DO Entered By: Kalman Shan on 08/03/2022 10:52:55 -------------------------------------------------------------------------------- Physician Orders Details Patient Name: Date of Service: KIMMY, TOTTEN RNESTINE 08/03/2022 10:00 A M Medical Record Number: [id number] Patient Account Number: [id number] Date of Birth/Sex: Treating RN: May 22, 1925 (88 y.o. [name], Meta.Reding Primary Care Provider: Marda Stalker Other Clinician: Referring Provider: Treating Provider/Extender: Aviva Signs in Treatment: 20 Verbal / Phone Orders: No Diagnosis Coding ICD-10 Coding Code Description I87.312 Chronic venous hypertension (idiopathic) with ulcer of left lower extremity L97.822 Non-pressure chronic ulcer of other part of left lower leg with fat layer exposed C50.919 Malignant neoplasm of unspecified site of unspecified female breast I50.42 Chronic combined systolic (congestive) and diastolic (congestive) heart failure G30.9 Alzheimer's disease, unspecified C54.1 Malignant neoplasm of endometrium Follow-up Appointments ppointment in 1 week. - Tuesday w/ Dr. Heber Elliston and Elias Else # 9 Return A Anesthetic (In clinic) Topical Lidocaine 5% applied to wound bed Bathing/ Shower/ Hygiene May shower with protection but do not get wound dressing(s) wet. - use cast protector. May shower and wash wound with soap and water. - with dressing changes may wash with soap and water. Edema Control - Lymphedema / SCD / Other Elevate legs to the level of the heart or above for 30 minutes daily and/or when sitting, a frequency of: Avoid  standing for long periods of time. Patient to wear own compression stockings every day. - apply in the morning and remove at night to right leg. Exercise regularly Moisturize legs daily. - every night before day right leg. Additional Orders / Instructions Follow Nutritious Diet - High Protein Diet Home Health No change in wound care orders this week; continue Home Health for wound care. May utilize formulary equivalent dressing for wound treatment orders unless otherwise specified. Other Home Health Orders/Instructions: [facility] home health Wound Treatment Wound #26 - Ankle Wound Laterality: Left, Lateral Cleanser: Soap and Water (Home Health) 2 x Per Week/30 Days Discharge Instructions: May shower and wash wound with dial antibacterial soap and water prior to dressing change. Peri-Wound Care: Sween Lotion (Moisturizing lotion) (Home Health) 2 x Per Week/30 Days Discharge Instructions: Apply moisturizing lotion as directed Prim Dressing: Xeroform Occlusive Gauze Dressing, 4x4 in (Home Health) 2 x Per Week/30 Days ary Discharge Instructions: Apply to wound bed as instructed Secondary Dressing: Zetuvit Plus 4x8 in ([place]) 2 x Per Week/30 Days Discharge Instructions: Apply over primary dressing as directed. Compression Wrap: ThreePress (3 layer compression wrap) (Home Health) 2 x Per Week/30 Days Discharge Instructions: Apply three layer compression as directed. Patient Medications llergies: No Known Drug Allergies A Notifications Medication Indication Start End 08/03/2022 lidocaine DOSE topical 5 % gel - gel topical applied only in clinic. Electronic Signature(s) Signed: 08/03/2022 11:20:59 AM By: Kalman Shan DO Entered By: Kalman Shan on 08/03/2022 10:53:02 -------------------------------------------------------------------------------- Problem List Details Patient Name: Date of Service: JENI, DULING RNESTINE 08/03/2022 10:00 A M Medical Record Number:  [id number] Patient Account Number: [id number] Date of Birth/Sex: Treating RN: [birth year] (88 y.o. [name] Primary Care Provider: Marda Stalker Other Clinician: Referring Provider: Treating Provider/Extender: Aviva Signs in Treatment: 89 Active Problems ICD-10 Encounter Code Description Active Date MDM Diagnosis I87.312 Chronic venous hypertension (idiopathic) with ulcer of left lower extremity 11/27/2021 No Yes L97.822 Non-pressure chronic ulcer of other part of left lower leg with fat layer exposed1/04/2022 No  Yes C50.919 Malignant neoplasm of unspecified site of unspecified female breast 11/27/2021 No Yes I50.42 Chronic combined systolic (congestive) and diastolic (congestive) heart failure 11/27/2021 No Yes G30.9 Alzheimer's disease, unspecified 11/27/2021 No Yes C54.1 Malignant neoplasm of endometrium 11/27/2021 No Yes Inactive Problems Resolved Problems Electronic Signature(s) Signed: 08/03/2022 11:20:59 AM By: Kalman Shan DO Entered By: Kalman Shan on 08/03/2022 10:51:33 -------------------------------------------------------------------------------- Progress Note Details Patient Name: Date of Service: PHILANA, YOUNIS RNESTINE 08/03/2022 10:00 A M Medical Record Number: [id number] Patient Account Number: [id number] Date of Birth/Sex: Treating RN: 05-23-1925 (88 y.o. F) Primary Care Provider: Marda Stalker Other Clinician: Referring Provider: Treating Provider/Extender: Aviva Signs in Treatment: 99 Subjective Chief Complaint Information obtained from Patient Left lower extremity wound History of Present Illness (HPI) this is a patient we know from several prior wounds on her bilateral lower extremities. She has venous stasis physiology, inflammation and hypertension. She is been fully evaluated for venous ablation and was found not to be a candidate. She does not have significant arterial disease. 02/14/15  the wound itself on her lateral left leg did not look too bad however there was surrounding maceration which was concerning 02/27/15 the wound itself is small with surrounding circumferential epithelialization there is no surrounding maceration. 03/06/15 only a very small area remains. I think this is mostly epithelialized however I would be uncomfortable not dressing this this week 03/13/15 the area is epithelialized. There was a thick surface on this. I took a #15 blade then lightly disrupted it but there does not appear to be any open area I did not see anything but appropriate epithelium Readmission: 08/16/18 on evaluation today patient presents for initial evaluation and our clinic released readmission although she has not been seen since April 2016. Nonetheless she is having issues with the ulcers currently on the bilateral lateral malleolus or locations as well as the left lateral lower extremity. These have been present for roughly 3 weeks since the patient actually developed significant bilateral lower extremity edema which patient's daughter states was roughly 3 times the size of her legs currently. Subsequently she ended up in the emerging department due to congestive heart failure. They were able to get the swelling under control and fortunately she seems to be doing much better at this point in time. She was placed in an [employer] which she sat on for week part evaluation today as well. Fortunately the wound areas actually seem to be doing fairly well there is some macerated skin surrounding there are the areas/surface of the wound which are gonna require some debridement at this point. No fevers, chills, nausea, or vomiting noted at this time. Patient has a history of hypertension, mild peripheral vascular disease, chronic venous stasis, and other than the ulcer seems to be doing fairly well today. No fevers, chills, nausea, or vomiting noted at this time. 08/23/18 on evaluation today  patient actually appears to be doing great in regard to her bilateral lower should be ulcers. She's shown signs of improvement even just with one week with the [employer]. In general I'm actually very pleased with how things appear at this point. 09/06/18 on evaluation today patient actually appears to be doing much better her left lower extremity is completely healed. Go to the right lower extremity lateral malleolus ulcer this still seems to be showing signs of being open. There does not appear to be evidence of infection which is good news. This did require some sharp debridement however to remove some  of the necrotic tissue/eschar on the surface of the wound Readmission 11/27/2021 Ms. Admire Bunnell is a 88 year old female with a past medical history of endometrial and breast cancer, chronic venous insufficiency, dementia and essential hypertension that presents to the clinic with a 1 month history of nonhealing ulcer to the left lower extremity. She states that the ulcer opened 1 month ago and then healed but has reopened again in the past week. She is currently keeping the area covered. She has a history of wounds to her lower extremities. She has compression stockings that she uses daily. She currently denies signs of infection. 1/13; patient presents for follow-up. She followed up for nurse visit for wrap change. She reports more odor. She denies pain. 1/20; patient presents for follow-up. She has been using gentamicin ointment with calcium alginate to the wound bed. She reports improvement in odor and drainage. She currently denies systemic signs of infection. 1/27; patient presents for follow-up. She has been using gentamicin ointment with calcium alginate. She received her Keystone antibiotic 2 days ago. She brought this in. Home health has also been established. They will come out for the first time next week. She currently denies signs of infection. She has no issues or complaints  today. 2/2; patient presents for follow-up. She has been using Keystone antibiotics with [facility] with no issues. She denies signs of infection. She has no issues or complaints today. 2/9; patient presents for follow-up. She has been using Keystone antibiotics with [facility] with no issues. She has tolerated the compression wrap well. She denies signs of infection. 2/16; patient presents for follow-up. She has been using Keystone antibiotics with Hydrofera Blue under compression wrap with no issues. She currently denies signs of infection. 2/23; patient presents for follow-up. She continues to use Keystone antibiotics with Hydrofera Blue under the compression wrap. She has no issues or complaints today. She denies signs of infection. Home health continues to come out and change the wrap. 3/2; patient presents for follow-up. She continues to use [facility] with [facility] under compression with no issues. She denies signs of infection. 3/16; patient presents for follow-up. She continues to use [facility] with [facility] under compression with no issues. Home health change the dressing last week. She denies signs of infection. 3/23; patient presents for follow-up. She continues to use Keystone antibiotics with Hydrofera Blue under compression with no issues. Home health continues to change the dressings. She denies signs of infection. 3/30; patient presents for follow-up. She continues to use Keystone antibiotics with Hydrofera Blue under compression with no issues. Home health continues to change the dressings. 4/13 2-week follow-up. Keystone and Hydrofera Blue wound is roughly 2.3 cm in diameter smaller. No other issues 4/20; patient presents for follow-up. She continues to use Keystone antibiotic with [facility] with no issues. 4/28; patient with a difficult wound on her right medial lower leg. Using [facility] Blue minimal improvement per our intake nurse we are  using compression. She has home health changing the dressing 5/5; patient presents for follow-up. We have been using Keystone antibiotics and Hydrofera Blue under compression therapy. She has no issues or complaints today. We discussed potentially using an advanced tissue product. She states she has had this done in the past and would like to proceed to see what her insurance would cover. 5/15; patient presents for follow-up. She has been using Keystone antibiotics and Hydrofera Blue under compression therapy. She forgot her Keystone antibiotics today. Insurance has not approved her  for skin substitute. She currently denies signs of infection. She has home health that changes the dressing once a week. 5/19; patient presents for follow-up. We have been using Keystone antibiotic and Hydrofera Blue under compression therapy. She has no issues or complaints today. The [employer] rep confirmed over the phone that the patient has been 100% approved for their skin substitute. We will verify this as we have not received any paperwork stating this. Patient states she would like to proceed with a skin substitute if it is covered by insurance. 6/1; patient presents for follow-up. We have been using Keystone antibiotic and Hydrofera Blue under compression therapy. We confirmed that she does have a co-pay for the [employer] skin substitute. We will hold off on using this for now since she continues to show improvement with current therapy. She denies signs of infection. She has home health that comes out twice weekly. 6/16; patient presents for follow-up. We have been using Keystone antibiotic and Hydrofera Blue under compression therapy. She has no issues or complaints today. We discussed the potential trial of vendaje. This is placenta tissue. She states she would like to try the free trial. We will reach out to our rep. 6/30; patient presents for follow-up. She has been using Keystone antibiotic and Hydrofera Blue  under compression therapy. She has been approved for a trial of vendaje. She would like to proceed with this and We will have this available at next clinic visit. She has no issues or complaints today. 7/18; patient presents for follow-up. She has been using Keystone antibiotic and Hydrofera Blue under compression. She has no issues or complaints today. We have a free sample of vendaje skin substitute and patient is agreeable to having this placed today. 7/24; patient presents for follow-up. We placed the first free trial of vendaje last week under compression wrap. Patient had no issues or complaints today. She tolerated this well. 8/1; patient presents for follow-up. Vandaje #2 donated product was placed in standard fashion last week under compression therapy. She has no issues or complaints today. 8/8; patient presents for follow-up. Vendaje #3 donated skin set was placed in standard fashion last visit under compression therapy. She has no issues or complaints today. 8/15; Patient presents for follow-up. Vandaje #4 was placed in standard fashion last week under compression therapy. She has no issues or complaints today. 8/22; patient presents for follow-up. Vandaje #5 was placed in standard fashion last week under compression therapy. She tolerated this well. 8/29; patient presents for follow-up. We have been using Xeroform under compression therapy and she has tolerated this well. She has no issues or complaints today. She has home health that is been coming out weekly For dressing changes. 9/12; patient presents for follow-up. We have been using Xeroform under compression therapy. Home health has been changing the dressings. She has no issues or complaints today. Patient History Information obtained from Patient. Family History Cancer - Child, Hypertension - Child,Father, Kidney Disease - Child, Stroke - Siblings,Child, Thyroid Problems - Child, No family history of Diabetes, Heart Disease,  Hereditary Spherocytosis, Lung Disease, Seizures, Tuberculosis. Social History Never smoker, Marital Status - Widowed, Alcohol Use - Never, Drug Use - No History, Caffeine Use - Never. Medical History Eyes Denies history of Cataracts, Glaucoma, Optic Neuritis Ear/Nose/Mouth/Throat Denies history of Chronic sinus problems/congestion, Middle ear problems Hematologic/Lymphatic Patient has history of Anemia Denies history of Hemophilia, Human Immunodeficiency Virus, Lymphedema, Sickle Cell Disease Respiratory Denies history of Aspiration, Asthma, Chronic Obstructive Pulmonary Disease (COPD), Pneumothorax,  Sleep Apnea, Tuberculosis Cardiovascular Patient has history of Congestive Heart Failure, Hypertension, Peripheral Venous Disease Denies history of Angina, Arrhythmia, Coronary Artery Disease, Hypotension, Myocardial Infarction, Peripheral Arterial Disease, Phlebitis, Vasculitis Gastrointestinal Denies history of Cirrhosis , Colitis, Crohnoos, Hepatitis A, Hepatitis B, Hepatitis C Endocrine Denies history of Type I Diabetes, Type II Diabetes Genitourinary Denies history of End Stage Renal Disease Immunological Denies history of Lupus Erythematosus, Raynaudoos, Scleroderma Integumentary (Skin) Denies history of History of Burn Musculoskeletal Patient has history of Osteoarthritis Denies history of Gout, Rheumatoid Arthritis, Osteomyelitis Neurologic Denies history of Neuropathy, Quadriplegia, Paraplegia, Seizure Disorder Oncologic Patient has history of Received Chemotherapy - Breast and Ovarian Cancer Denies history of Received Radiation Hospitalization/Surgery History - diverticulits. Objective Constitutional respirations regular, non-labored and within target range for patient.. Vitals Time Taken: 10:00 AM, Height: 66 in, Weight: 176 lbs, BMI: 28.4, Temperature: 99 F, Pulse: 67 bpm, Respiratory Rate: 18 breaths/min, Blood Pressure: 136/79 mmHg. Cardiovascular 2+ dorsalis  pedis/posterior tibialis pulses. Psychiatric pleasant and cooperative. General Notes: Left lower extremity: T the lateral aspect there is an open wound with granulation tissue and epithelization to the edges circumferentially. Good o edema control. Integumentary (Hair, Skin) Wound #26 status is Open. Original cause of wound was Gradually Appeared. The date acquired was: 11/20/2021. The wound has been in treatment 35 weeks. The wound is located on the Left,Lateral Ankle. The wound measures 0.1cm length x 0.2cm width x 0.1cm depth; 0.016cm^2 area and 0.002cm^3 volume. There is Fat Layer (Subcutaneous Tissue) exposed. There is no tunneling or undermining noted. There is a medium amount of serosanguineous drainage noted. The wound margin is distinct with the outline attached to the wound base. There is large (67-100%) red granulation within the wound bed. There is no necrotic tissue within the wound bed. Assessment Active Problems ICD-10 Chronic venous hypertension (idiopathic) with ulcer of left lower extremity Non-pressure chronic ulcer of other part of left lower leg with fat layer exposed Malignant neoplasm of unspecified site of unspecified female breast Chronic combined systolic (congestive) and diastolic (congestive) heart failure Alzheimer's disease, unspecified Malignant neoplasm of endometrium Patient's wound has shown improvement in size in appearance since last clinic visit. I recommended continuing the course with Xeroform under 3 layer compression. She has compression stockings for when her wound heals. Follow-up in 1 week. Procedures Wound #26 Pre-procedure diagnosis of Wound #26 is a Venous Leg Ulcer located on the Left,Lateral Ankle . There was a Three Layer Compression Therapy Procedure by Deon Pilling, RN. Post procedure Diagnosis Wound #26: Same as Pre-Procedure Plan Follow-up Appointments: Return Appointment in 1 week. - Tuesday w/ Dr. Heber Coryell and Allayne Butcher Rm #  9 Anesthetic: (In clinic) Topical Lidocaine 5% applied to wound bed Bathing/ Shower/ Hygiene: May shower with protection but do not get wound dressing(s) wet. - use cast protector. May shower and wash wound with soap and water. - with dressing changes may wash with soap and water. Edema Control - Lymphedema / SCD / Other: Elevate legs to the level of the heart or above for 30 minutes daily and/or when sitting, a frequency of: Avoid standing for long periods of time. Patient to wear own compression stockings every day. - apply in the morning and remove at night to right leg. Exercise regularly Moisturize legs daily. - every night before day right leg. Additional Orders / Instructions: Follow Nutritious Diet - High Protein Diet Home Health: No change in wound care orders this week; continue Home Health for wound care. May utilize formulary equivalent dressing for  wound treatment orders unless otherwise specified. Other Home Health Orders/Instructions: - [facility] home [facility] The following medication(s) was prescribed: lidocaine topical 5 % gel gel topical applied only in clinic. was prescribed at facility WOUND #26: - Ankle Wound Laterality: Left, Lateral Cleanser: Soap and Water (Home Health) 2 x Per Week/30 Days Discharge Instructions: May shower and wash wound with dial antibacterial soap and water prior to dressing change. Peri-Wound Care: Sween Lotion (Moisturizing lotion) (Home Health) 2 x Per Week/30 Days Discharge Instructions: Apply moisturizing lotion as directed Prim Dressing: Xeroform Occlusive Gauze Dressing, 4x4 in (Home Health) 2 x Per Week/30 Days ary Discharge Instructions: Apply to wound bed as instructed Secondary Dressing: Zetuvit Plus 4x8 in ([place]) 2 x Per Week/30 Days Discharge Instructions: Apply over primary dressing as directed. Com pression Wrap: ThreePress (3 layer compression wrap) (Home Health) 2 x Per Week/30 Days Discharge Instructions: Apply three  layer compression as directed. 1. Xeroform under 3 layer compression 2. Follow-up in 1 week Electronic Signature(s) Signed: 08/03/2022 11:20:59 AM By: Kalman Shan DO Entered By: Kalman Shan on 08/03/2022 10:54:11 -------------------------------------------------------------------------------- HxROS Details Patient Name: Date of Service: GRETHEL, ZENK RNESTINE 08/03/2022 10:00 A M Medical Record Number: [id number] Patient Account Number: [id number] Date of Birth/Sex: Treating RN: 11/24/1924 (88 y.o. F) Primary Care Provider: Marda Stalker Other Clinician: Referring Provider: Treating Provider/Extender: Aviva Signs in Treatment: 73 Information Obtained From Patient Eyes Medical History: Negative for: Cataracts; Glaucoma; Optic Neuritis Ear/Nose/Mouth/Throat Medical History: Negative for: Chronic sinus problems/congestion; Middle ear problems Hematologic/Lymphatic Medical History: Positive for: Anemia Negative for: Hemophilia; Human Immunodeficiency Virus; Lymphedema; Sickle Cell Disease Respiratory Medical History: Negative for: Aspiration; Asthma; Chronic Obstructive Pulmonary Disease (COPD); Pneumothorax; Sleep Apnea; Tuberculosis Cardiovascular Medical History: Positive for: Congestive Heart Failure; Hypertension; Peripheral Venous Disease Negative for: Angina; Arrhythmia; Coronary Artery Disease; Hypotension; Myocardial Infarction; Peripheral Arterial Disease; Phlebitis; Vasculitis Gastrointestinal Medical History: Negative for: Cirrhosis ; Colitis; Crohns; Hepatitis A; Hepatitis B; Hepatitis C Endocrine Medical History: Negative for: Type I Diabetes; Type II Diabetes Genitourinary Medical History: Negative for: End Stage Renal Disease Immunological Medical History: Negative for: Lupus Erythematosus; Raynauds; Scleroderma Integumentary (Skin) Medical History: Negative for: History of Burn Musculoskeletal Medical  History: Positive for: Osteoarthritis Negative for: Gout; Rheumatoid Arthritis; Osteomyelitis Neurologic Medical History: Negative for: Neuropathy; Quadriplegia; Paraplegia; Seizure Disorder Oncologic Medical History: Positive for: Received Chemotherapy - Breast and Ovarian Cancer Negative for: Received Radiation Immunizations Pneumococcal Vaccine: Received Pneumococcal Vaccination: Yes Received Pneumococcal Vaccination On or After 60th Birthday: Yes Implantable Devices None Hospitalization / Surgery History Type of Hospitalization/Surgery diverticulits Family and Social History Cancer: Yes - Child; Diabetes: No; Heart Disease: No; Hereditary Spherocytosis: No; Hypertension: Yes - Child,Father; Kidney Disease: Yes - Child; Lung Disease: No; Seizures: No; Stroke: Yes - Siblings,Child; Thyroid Problems: Yes - Child; Tuberculosis: No; Never smoker; Marital Status - Widowed; Alcohol Use: Never; Drug Use: No History; Caffeine Use: Never; Financial Concerns: No; Food, Clothing or Shelter Needs: No; Support System Lacking: No; Transportation Concerns: No Electronic Signature(s) Signed: 08/03/2022 11:20:59 AM By: Kalman Shan DO Entered By: Kalman Shan on 08/03/2022 10:52:34 -------------------------------------------------------------------------------- SuperBill Details Patient Name: Date of Service: KATALEA, UCCI RNESTINE 08/03/2022 Medical Record Number: [id number] Patient Account Number: [id number] Date of Birth/Sex: Treating RN: 01/31/1925 (88 y.o. [name] Primary Care Provider: Marda Stalker Other Clinician: Referring Provider: Treating Provider/Extender: Marjo Bicker Weeks in Treatment: 35 Diagnosis Coding ICD-10 Codes Code Description [phone] Chronic venous hypertension (idiopathic) with ulcer of left lower extremity L97.822 Non-pressure chronic  ulcer of other part of left lower leg with fat layer exposed C50.919 Malignant neoplasm  of unspecified site of unspecified female breast I50.42 Chronic combined systolic (congestive) and diastolic (congestive) heart failure G30.9 Alzheimer's disease, unspecified C54.1 Malignant neoplasm of endometrium Facility Procedures CPT4 Code: [id number] Description: (Facility Use Only) [phone] - [facility] LT LEG Modifier: Quantity: 1 Physician Procedures : CPT4 Code Description Modifier [id number] - WC PHYS LEVEL 3 - EST PT ICD-10 Diagnosis Description I87.312 Chronic venous hypertension (idiopathic) with ulcer of left lower extremity L97.822 Non-pressure chronic ulcer of other part of left lower leg  with fat layer exposed I50.42 Chronic combined systolic (congestive) and diastolic (congestive) heart failure G30.9 Alzheimer's disease, unspecified Quantity: 1 Electronic Signature(s) Signed: 08/03/2022 11:20:59 AM By: Kalman Shan DO Entered By: Kalman Shan on 08/03/2022 10:54:28

## 2022-08-03 NOTE — Progress Notes (Signed)
[name], [name] ([id number]) Visit Report for 08/03/2022 Arrival Information Details Patient Name: Date of Service: [name], [name] 08/03/2022 10:00 A M Medical Record Number: [id number] Patient Account Number: [id number] Date of Birth/Sex: Treating RN: 05-11-1925 (88 y.o. F) Primary Care Qunicy Higinbotham: Marda Stalker Other Clinician: Referring Kerrianne Jeng: Treating Mahamud Metts/Extender: Aviva Signs in Treatment: 43 Visit Information History Since Last Visit Added or deleted any medications: No Patient Arrived: Wheel Chair Any new allergies or adverse reactions: No Arrival Time: 09:57 Had a fall or experienced change in No Accompanied By: daughter activities of daily living that may affect Transfer Assistance: None risk of falls: Patient Identification Verified: Yes Signs or symptoms of abuse/neglect since last visito No Secondary Verification Process Completed: Yes Hospitalized since last visit: No Patient Requires Transmission-Based Precautions: No Implantable device outside of the clinic excluding No Patient Has Alerts: No cellular tissue based products placed in the center since last visit: Has Compression in Place as Prescribed: Yes Pain Present Now: No Electronic Signature(s) Signed: 08/03/2022 4:33:24 PM By: Erenest Blank Entered By: Erenest Blank on 08/03/2022 10:00:27 -------------------------------------------------------------------------------- Compression Therapy Details Patient Name: Date of Service: [name], [name] 08/03/2022 10:00 A M Medical Record Number: [id number] Patient Account Number: [id number] Date of Birth/Sex: Treating RN: 11/08/1925 (88 y.o. [name] Primary Care Frances Joynt: Marda Stalker Other Clinician: Referring Amillion Scobee: Treating Lawson Isabell/Extender: Aviva Signs in Treatment: 35 Compression Therapy Performed for Wound Assessment: Wound #26 Left,Lateral Ankle Performed By:  Clinician Deon Pilling, RN Compression Type: Three Layer Post Procedure Diagnosis Same as Pre-procedure Electronic Signature(s) Signed: 08/03/2022 5:16:45 PM By: Deon Pilling RN, BSN Entered By: Deon Pilling on 08/03/2022 10:28:34 -------------------------------------------------------------------------------- Encounter Discharge Information Details Patient Name: Date of Service: [name], [name] 08/03/2022 10:00 [place] Record Number: [id number] Patient Account Number: [id number] Date of Birth/Sex: Treating RN: 1925-08-05 (88 y.o. [name], Tammi Klippel Primary Care Morgann Woodburn: Marda Stalker Other Clinician: Referring Ramandeep Arington: Treating Okie Bogacz/Extender: Aviva Signs in Treatment: 40 Encounter Discharge Information Items Discharge Condition: Stable Ambulatory Status: Wheelchair Discharge Destination: Home Transportation: Private Auto Accompanied By: daughter Schedule Follow-up Appointment: Yes Clinical Summary of Care: Electronic Signature(s) Signed: 08/03/2022 5:16:45 PM By: Deon Pilling RN, BSN Entered By: Deon Pilling on 08/03/2022 10:29:53 -------------------------------------------------------------------------------- Lower Extremity Assessment Details Patient Name: Date of Service: [name], [name] 08/03/2022 10:00 A M Medical Record Number: [id number] Patient Account Number: [id number] Date of Birth/Sex: Treating RN: 10-Feb-1925 (88 y.o. F) Primary Care Tanishka Drolet: Marda Stalker Other Clinician: Referring Vuk Skillern: Treating Prentiss Polio/Extender: Marjo Bicker Weeks in Treatment: 35 Edema Assessment Assessed: [Left: No] [Right: No] Edema: [Left: N] [Right: o] Calf Left: Right: Point of Measurement: 30 cm From Medial Instep 32.5 cm 33 cm Ankle Left: Right: Point of Measurement: 9 cm From Medial Instep 20 cm 21 cm Electronic Signature(s) Signed: 08/03/2022 4:33:24 PM By: Erenest Blank Entered By: Erenest Blank on 08/03/2022 10:13:31 -------------------------------------------------------------------------------- Multi Wound Chart Details Patient Name: Date of Service: [name], [name] 08/03/2022 10:00 A M Medical Record Number: [id number] Patient Account Number: [id number] Date of Birth/Sex: Treating RN: 02/27/1925 (88 y.o. F) Primary Care Caylah Plouff: Marda Stalker Other Clinician: Referring Jawana Reagor: Treating Marissah Vandemark/Extender: Aviva Signs in Treatment: 35 Vital Signs Height(in): 66 Pulse(bpm): 21 Weight(lbs): 176 Blood Pressure(mmHg): 136/79 Body Mass Index(BMI): 28.4 Temperature(F): 99 Respiratory Rate(breaths/min): 18 Photos: [N/A:N/A] Left, Lateral Ankle N/A N/A Wound Location: Gradually Appeared N/A N/A Wounding Event: Venous Leg Ulcer N/A N/A Primary Etiology: Anemia, Congestive Heart Failure, N/A N/A Comorbid History:  Hypertension, Peripheral Venous Disease, Osteoarthritis, Received Chemotherapy 11/20/2021 N/A N/A Date Acquired: 13 N/A N/A Weeks of Treatment: Open N/A N/A Wound Status: No N/A N/A Wound Recurrence: 0.1x0.2x0.1 N/A N/A Measurements L x W x D (cm) 0.016 N/A N/A A (cm) : rea 0.002 N/A N/A Volume (cm) : 99.80% N/A N/A % Reduction in Area: 99.80% N/A N/A % Reduction in Volume: Full Thickness Without Exposed N/A N/A Classification: Support Structures Medium N/A N/A Exudate Amount: Serosanguineous N/A N/A Exudate Type: red, brown N/A N/A Exudate Color: Distinct, outline attached N/A N/A Wound Margin: Large (67-100%) N/A N/A Granulation Amount: Red N/A N/A Granulation Quality: None Present (0%) N/A N/A Necrotic Amount: Fat Layer (Subcutaneous Tissue): Yes N/A N/A Exposed Structures: Fascia: No Tendon: No Muscle: No Joint: No Bone: No Large (67-100%) N/A N/A Epithelialization: Compression Therapy N/A N/A Procedures Performed: Treatment Notes Wound #26 (Ankle) Wound Laterality: Left,  Lateral Cleanser Soap and Water Discharge Instruction: May shower and wash wound with dial antibacterial soap and water prior to dressing change. Peri-Wound Care Sween Lotion (Moisturizing lotion) Discharge Instruction: Apply moisturizing lotion as directed Topical Primary Dressing Xeroform Occlusive Gauze Dressing, 4x4 in Discharge Instruction: Apply to wound bed as instructed Secondary Dressing Zetuvit Plus 4x8 in Discharge Instruction: Apply over primary dressing as directed. Secured With Compression Wrap ThreePress (3 layer compression wrap) Discharge Instruction: Apply three layer compression as directed. Compression Stockings Add-Ons Electronic Signature(s) Signed: 08/03/2022 11:20:59 AM By: Kalman Shan DO Entered By: Kalman Shan on 08/03/2022 10:51:42 -------------------------------------------------------------------------------- Multi-Disciplinary Care Plan Details Patient Name: Date of Service: [name], [name] 08/03/2022 10:00 A M Medical Record Number: [id number] Patient Account Number: [id number] Date of Birth/Sex: Treating RN: 02-Aug-1925 (88 y.o. [name], Tammi Klippel Primary Care Shalan Neault: Marda Stalker Other Clinician: Referring Roman Sandall: Treating Makenzi Bannister/Extender: Aviva Signs in Treatment: 44 Active Inactive Nutrition Nursing Diagnoses: Imbalanced nutrition Goals: Patient/caregiver agrees to and verbalizes understanding of need to use nutritional supplements and/or vitamins as prescribed Date Initiated: 11/27/2021 Target Resolution Date: 08/20/2022 Goal Status: Active Interventions: Assess patient nutrition upon admission and as needed per policy Provide education on nutrition Treatment Activities: Education provided on Nutrition : 07/20/2022 Notes: 06/22/2022 #3 vendaje advance tissue product applied by product, compression continued. Electronic Signature(s) Signed: 08/03/2022 5:16:45 PM By: Deon Pilling RN,  BSN Entered By: Deon Pilling on 08/03/2022 10:07:28 -------------------------------------------------------------------------------- Pain Assessment Details Patient Name: Date of Service: [name], [name] 08/03/2022 10:00 A M Medical Record Number: [id number] Patient Account Number: [id number] Date of Birth/Sex: Treating RN: 17-Jan-1925 (88 y.o. F) Primary Care Oluwaseun Bruyere: Marda Stalker Other Clinician: Referring Ramal Eckhardt: Treating Promyse Ardito/Extender: Aviva Signs in Treatment: 72 Active Problems Location of Pain Severity and Description of Pain Patient Has Paino No Site Locations Pain Management and Medication Current Pain Management: Electronic Signature(s) Signed: 08/03/2022 4:33:24 PM By: Erenest Blank Entered By: Erenest Blank on 08/03/2022 10:00:56 -------------------------------------------------------------------------------- Patient/Caregiver Education Details Patient Name: Date of Service: [name] 9/12/2023andnbsp10:00 A M Medical Record Number: [id number] Patient Account Number: [id number] Date of Birth/Gender: Treating RN: 1925/07/01 (88 y.o. [name] Primary Care Physician: Marda Stalker Other Clinician: Referring Physician: Treating Physician/Extender: Aviva Signs in Treatment: 59 Education Assessment Education Provided To: Patient Education Topics Provided Nutrition: Handouts: Nutrition Methods: Explain/Verbal Responses: Reinforcements needed Electronic Signature(s) Signed: 08/03/2022 5:16:45 PM By: Deon Pilling RN, BSN Entered By: Deon Pilling on 08/03/2022 10:07:39 -------------------------------------------------------------------------------- Wound Assessment Details Patient Name: Date of Service: [name], NUTTLE [name] 08/03/2022 10:00 A M Medical Record Number: [id number] Patient  Account Number: [id number] Date of Birth/Sex: Treating RN: 1925/04/12 (88 y.o.  F) Primary Care Nyron Mozer: Marda Stalker Other Clinician: Referring Jannet Calip: Treating Eletha Culbertson/Extender: Marjo Bicker Weeks in Treatment: 35 Wound Status Wound Number: 26 Primary Venous Leg Ulcer Etiology: Wound Location: Left, Lateral Ankle Wound Open Wounding Event: Gradually Appeared Status: Date Acquired: 11/20/2021 Comorbid Anemia, Congestive Heart Failure, Hypertension, Peripheral Weeks Of Treatment: 35 History: Venous Disease, Osteoarthritis, Received Chemotherapy Clustered Wound: No Photos Wound Measurements Length: (cm) 0.1 Width: (cm) 0.2 Depth: (cm) 0.1 Area: (cm) 0.016 Volume: (cm) 0.002 % Reduction in Area: 99.8% % Reduction in Volume: 99.8% Epithelialization: Large (67-100%) Tunneling: No Undermining: No Wound Description Classification: Full Thickness Without Exposed Support Structures Wound Margin: Distinct, outline attached Exudate Amount: Medium Exudate Type: Serosanguineous Exudate Color: red, brown Foul Odor After Cleansing: No Slough/Fibrino Yes Wound Bed Granulation Amount: Large (67-100%) Exposed Structure Granulation Quality: Red Fascia Exposed: No Necrotic Amount: None Present (0%) Fat Layer (Subcutaneous Tissue) Exposed: Yes Tendon Exposed: No Muscle Exposed: No Joint Exposed: No Bone Exposed: No Treatment Notes Wound #26 (Ankle) Wound Laterality: Left, Lateral Cleanser Soap and Water Discharge Instruction: May shower and wash wound with dial antibacterial soap and water prior to dressing change. Peri-Wound Care Sween Lotion (Moisturizing lotion) Discharge Instruction: Apply moisturizing lotion as directed Topical Primary Dressing Xeroform Occlusive Gauze Dressing, 4x4 in Discharge Instruction: Apply to wound bed as instructed Secondary Dressing Zetuvit Plus 4x8 in Discharge Instruction: Apply over primary dressing as directed. Secured With Compression Wrap ThreePress (3 layer compression  wrap) Discharge Instruction: Apply three layer compression as directed. Compression Stockings Add-Ons Electronic Signature(s) Signed: 08/03/2022 4:33:24 PM By: Erenest Blank Entered By: Erenest Blank on 08/03/2022 10:14:46 -------------------------------------------------------------------------------- Vitals Details Patient Name: Date of Service: LATRECIA, CAPITO [name] 08/03/2022 10:00 A M Medical Record Number: [id number] Patient Account Number: [id number] Date of Birth/Sex: Treating RN: 06-11-1925 (88 y.o. F) Primary Care Karell Tukes: Marda Stalker Other Clinician: Referring Radley Barto: Treating Natori Gudino/Extender: Aviva Signs in Treatment: 35 Vital Signs Time Taken: 10:00 Temperature (F): 99 Height (in): 66 Pulse (bpm): 67 Weight (lbs): 176 Respiratory Rate (breaths/min): 18 Body Mass Index (BMI): 28.4 Blood Pressure (mmHg): 136/79 Reference Range: 80 - 120 mg / dl Electronic Signature(s) Signed: 08/03/2022 4:33:24 PM By: Erenest Blank Entered By: Erenest Blank on 08/03/2022 10:00:49

## 2022-08-10 ENCOUNTER — Encounter (HOSPITAL_BASED_OUTPATIENT_CLINIC_OR_DEPARTMENT_OTHER): Payer: Medicare PPO | Admitting: Internal Medicine

## 2022-08-10 DIAGNOSIS — I110 Hypertensive heart disease with heart failure: Secondary | ICD-10-CM | POA: Diagnosis not present

## 2022-08-10 DIAGNOSIS — I5042 Chronic combined systolic (congestive) and diastolic (congestive) heart failure: Secondary | ICD-10-CM

## 2022-08-10 DIAGNOSIS — Z8542 Personal history of malignant neoplasm of other parts of uterus: Secondary | ICD-10-CM | POA: Diagnosis not present

## 2022-08-10 DIAGNOSIS — I87312 Chronic venous hypertension (idiopathic) with ulcer of left lower extremity: Secondary | ICD-10-CM

## 2022-08-10 DIAGNOSIS — G309 Alzheimer's disease, unspecified: Secondary | ICD-10-CM

## 2022-08-10 DIAGNOSIS — F0280 Dementia in other diseases classified elsewhere without behavioral disturbance: Secondary | ICD-10-CM | POA: Diagnosis not present

## 2022-08-10 DIAGNOSIS — Z853 Personal history of malignant neoplasm of breast: Secondary | ICD-10-CM | POA: Diagnosis not present

## 2022-08-10 DIAGNOSIS — L97822 Non-pressure chronic ulcer of other part of left lower leg with fat layer exposed: Secondary | ICD-10-CM | POA: Diagnosis not present

## 2022-08-10 NOTE — Progress Notes (Signed)
[name], [name] ([id number]) Visit Report for 08/10/2022 Arrival Information Details Patient Name: Date of Service: [name], [name] 08/10/2022 10:00 A M Medical Record Number: [id number] Patient Account Number: [id number] Date of Birth/Sex: Treating RN: 1925/04/17 (88 y.o. [name], [name] Primary Care Emmerson Taddei: Marda Stalker Other Clinician: Referring Aarian Cleaver: Treating Luria Rosario/Extender: Aviva Signs in Treatment: 80 Visit Information History Since Last Visit Added or deleted any medications: No Patient Arrived: Wheel Chair Any new allergies or adverse reactions: No Arrival Time: 10:05 Had a fall or experienced change in No Accompanied By: daughter activities of daily living that may affect Transfer Assistance: Manual risk of falls: Patient Identification Verified: Yes Signs or symptoms of abuse/neglect since last visito No Secondary Verification Process Completed: Yes Hospitalized since last visit: No Patient Requires Transmission-Based Precautions: No Implantable device outside of the clinic excluding No Patient Has Alerts: No cellular tissue based products placed in the center since last visit: Has Dressing in Place as Prescribed: Yes Has Compression in Place as Prescribed: Yes Pain Present Now: No Electronic Signature(s) Signed: 08/10/2022 4:16:42 PM By: [name] Entered By: Rhae Hammock on 08/10/2022 10:05:36 -------------------------------------------------------------------------------- Compression Therapy Details Patient Name: Date of Service: [name], [name] 08/10/2022 10:00 A M Medical Record Number: [id number] Patient Account Number: [id number] Date of Birth/Sex: Treating RN: May 25, 1925 (88 y.o. [name], [name] Primary Care Jayson Waterhouse: Marda Stalker Other Clinician: Referring Auset Fritzler: Treating Annaelle Kasel/Extender: Aviva Signs in Treatment: 36 Compression Therapy  Performed for Wound Assessment: Wound #26 Left,Lateral Ankle Performed By: Clinician Rhae Hammock, RN Compression Type: Three Layer Post Procedure Diagnosis Same as Pre-procedure Electronic Signature(s) Signed: 08/10/2022 4:16:42 PM By: [name] Entered By: Rhae Hammock on 08/10/2022 10:19:56 -------------------------------------------------------------------------------- Encounter Discharge Information Details Patient Name: Date of Service: [name], [name] 08/10/2022 10:00 A M Medical Record Number: [id number] Patient Account Number: [id number] Date of Birth/Sex: Treating RN: 10/30/1925 (88 y.o. [name], [name] Primary Care Aoki Wedemeyer: Marda Stalker Other Clinician: Referring Cartier Washko: Treating Kolden Dupee/Extender: Aviva Signs in Treatment: 64 Encounter Discharge Information Items Discharge Condition: Stable Ambulatory Status: Wheelchair Discharge Destination: Home Transportation: Private Auto Accompanied By: self Schedule Follow-up Appointment: Yes Clinical Summary of Care: Patient Declined Electronic Signature(s) Signed: 08/10/2022 4:16:42 PM By: [name] Entered By: Rhae Hammock on 08/10/2022 10:32:54 -------------------------------------------------------------------------------- Lower Extremity Assessment Details Patient Name: Date of Service: [name], [name] 08/10/2022 10:00 A M Medical Record Number: [id number] Patient Account Number: [id number] Date of Birth/Sex: Treating RN: 1925/05/16 (88 y.o. [name], [name] Primary Care Samanthamarie Ezzell: Marda Stalker Other Clinician: Referring Greg Cratty: Treating Ashaya Raftery/Extender: Marjo Bicker Weeks in Treatment: 36 Edema Assessment Assessed: [name]: Yes] Patrice Paradise: No] Edema: [Left: N] [Right: o] Calf Left: Right: Point of Measurement: 30 cm From Medial Instep 34.8 cm 33 cm Ankle Left: Right: Point of Measurement: 9 cm From  Medial Instep 21.5 cm 21 cm Vascular Assessment Pulses: Dorsalis Pedis Palpable: [Left:Yes] Posterior Tibial Palpable: [Left:Yes] Electronic Signature(s) Signed: 08/10/2022 4:16:42 PM By: [name] Entered By: Rhae Hammock on 08/10/2022 10:10:55 -------------------------------------------------------------------------------- Multi Wound Chart Details Patient Name: Date of Service: [name], [name] 08/10/2022 10:00 A M Medical Record Number: [id number] Patient Account Number: [id number] Date of Birth/Sex: Treating RN: Dec 03, 1924 (88 y.o. [name], [name] Primary Care Aitanna Haubner: Marda Stalker Other Clinician: Referring Chibuikem Thang: Treating Tearia Gibbs/Extender: Aviva Signs in Treatment: 36 Vital Signs Height(in): 66 Pulse(bpm): 73 Weight(lbs): 176 Blood Pressure(mmHg): 151/73 Body Mass Index(BMI): 28.4 Temperature(F): 98.3 Respiratory Rate(breaths/min): 17 Photos: [N/A:N/A] Left, Lateral  Ankle N/A N/A Wound Location: Gradually Appeared N/A N/A Wounding Event: Venous Leg Ulcer N/A N/A Primary Etiology: Anemia, Congestive Heart Failure, N/A N/A Comorbid History: Hypertension, Peripheral Venous Disease, Osteoarthritis, Received Chemotherapy 11/20/2021 N/A N/A Date Acquired: 9 N/A N/A Weeks of Treatment: Open N/A N/A Wound Status: No N/A N/A Wound Recurrence: 0.1x0.1x0.1 N/A N/A Measurements L x W x D (cm) 0.008 N/A N/A A (cm) : rea 0.001 N/A N/A Volume (cm) : 99.90% N/A N/A % Reduction in Area: 99.90% N/A N/A % Reduction in Volume: Full Thickness Without Exposed N/A N/A Classification: Support Structures Medium N/A N/A Exudate Amount: Serosanguineous N/A N/A Exudate Type: red, brown N/A N/A Exudate Color: Distinct, outline attached N/A N/A Wound Margin: Large (67-100%) N/A N/A Granulation Amount: Red N/A N/A Granulation Quality: None Present (0%) N/A N/A Necrotic Amount: Fat Layer  (Subcutaneous Tissue): Yes N/A N/A Exposed Structures: Fascia: No Tendon: No Muscle: No Joint: No Bone: No Large (67-100%) N/A N/A Epithelialization: Compression Therapy N/A N/A Procedures Performed: Treatment Notes Electronic Signature(s) Signed: 08/10/2022 12:05:18 PM By: Kalman Shan DO Signed: 08/10/2022 4:16:42 PM By: [name] Entered By: Kalman Shan on 08/10/2022 10:21:43 -------------------------------------------------------------------------------- Multi-Disciplinary Care Plan Details Patient Name: Date of Service: [name], [name] 08/10/2022 10:00 A M Medical Record Number: [id number] Patient Account Number: [id number] Date of Birth/Sex: Treating RN: March 01, 1925 (88 y.o. [name], [name] Primary Care Lakiah Dhingra: Marda Stalker Other Clinician: Referring Kharma Sampsel: Treating Loyal Rudy/Extender: Aviva Signs in Treatment: 92 Active Inactive Nutrition Nursing Diagnoses: Imbalanced nutrition Goals: Patient/caregiver agrees to and verbalizes understanding of need to use nutritional supplements and/or vitamins as prescribed Date Initiated: 11/27/2021 Target Resolution Date: 08/20/2022 Goal Status: Active Interventions: Assess patient nutrition upon admission and as needed per policy Provide education on nutrition Treatment Activities: Education provided on Nutrition : 08/03/2022 Notes: 06/22/2022 #3 vendaje advance tissue product applied by product, compression continued. Electronic Signature(s) Signed: 08/10/2022 4:16:42 PM By: [name] Entered By: Rhae Hammock on 08/10/2022 10:32:02 -------------------------------------------------------------------------------- Pain Assessment Details Patient Name: Date of Service: [name], [name] 08/10/2022 10:00 A M Medical Record Number: [id number] Patient Account Number: [id number] Date of Birth/Sex: Treating RN: Dec 09, 1924 (88 y.o. [name],  [name] Primary Care Serigne Kubicek: Marda Stalker Other Clinician: Referring Raeleigh Guinn: Treating Debborah Alonge/Extender: Aviva Signs in Treatment: 36 Active Problems Location of Pain Severity and Description of Pain Patient Has Paino No Site Locations Pain Management and Medication Current Pain Management: Electronic Signature(s) Signed: 08/10/2022 4:16:42 PM By: [name] Entered By: Rhae Hammock on 08/10/2022 10:06:45 -------------------------------------------------------------------------------- Patient/Caregiver Education Details Patient Name: Date of Service: [name] 9/19/2023andnbsp10:00 A M Medical Record Number: [id number] Patient Account Number: [id number] Date of Birth/Gender: Treating RN: 1924-11-26 (88 y.o. [name], [name] Primary Care Physician: Marda Stalker Other Clinician: Referring Physician: Treating Physician/Extender: Aviva Signs in Treatment: 2 Education Assessment Education Provided To: Patient Education Topics Provided Nutrition: Methods: Explain/Verbal Responses: Reinforcements needed, State content correctly Electronic Signature(s) Signed: 08/10/2022 4:16:42 PM By: [name] Entered By: Rhae Hammock on 08/10/2022 10:32:13 -------------------------------------------------------------------------------- Wound Assessment Details Patient Name: Date of Service: [name], [name] 08/10/2022 10:00 A M Medical Record Number: [id number] Patient Account Number: [id number] Date of Birth/Sex: Treating RN: 1925/08/05 (88 y.o. [name], [name] Primary Care Lilit Cinelli: Marda Stalker Other Clinician: Referring Yves Fodor: Treating Raigan Baria/Extender: Marjo Bicker Weeks in Treatment: 36 Wound Status Wound Number: 26 Primary Venous Leg Ulcer Etiology: Wound Location: Left, Lateral Ankle Wound Open Wounding Event: Gradually  Appeared Status:  Date Acquired: 11/20/2021 Comorbid Anemia, Congestive Heart Failure, Hypertension, Peripheral Weeks Of Treatment: 36 History: Venous Disease, Osteoarthritis, Received Chemotherapy Clustered Wound: No Photos Wound Measurements Length: (cm) 0.1 Width: (cm) 0.1 Depth: (cm) 0.1 Area: (cm) 0.008 Volume: (cm) 0.001 % Reduction in Area: 99.9% % Reduction in Volume: 99.9% Epithelialization: Large (67-100%) Wound Description Classification: Full Thickness Without Exposed Support Structures Wound Margin: Distinct, outline attached Exudate Amount: Medium Exudate Type: Serosanguineous Exudate Color: red, brown Foul Odor After Cleansing: No Slough/Fibrino Yes Wound Bed Granulation Amount: Large (67-100%) Exposed Structure Granulation Quality: Red Fascia Exposed: No Necrotic Amount: None Present (0%) Fat Layer (Subcutaneous Tissue) Exposed: Yes Tendon Exposed: No Muscle Exposed: No Joint Exposed: No Bone Exposed: No Treatment Notes Wound #26 (Ankle) Wound Laterality: Left, Lateral Cleanser Soap and Water Discharge Instruction: May shower and wash wound with dial antibacterial soap and water prior to dressing change. Peri-Wound Care Sween Lotion (Moisturizing lotion) Discharge Instruction: Apply moisturizing lotion as directed Topical Primary Dressing ADAPTIC TOUCH 3x4.25 (in/in) Discharge Instruction: Apply to wound bed as instructed Secondary Dressing Zetuvit Plus 4x8 in Discharge Instruction: Apply over primary dressing as directed. Secured With Compression Wrap ThreePress (3 layer compression wrap) Discharge Instruction: Apply three layer compression as directed. Compression Stockings Add-Ons Electronic Signature(s) Signed: 08/10/2022 4:16:42 PM By: [name] Entered By: Rhae Hammock on 08/10/2022 10:19:39 -------------------------------------------------------------------------------- Vitals Details Patient Name: Date of  Service: MASIYAH, JORSTAD [name] 08/10/2022 10:00 A M Medical Record Number: [id number] Patient Account Number: [id number] Date of Birth/Sex: Treating RN: 30-Dec-1924 (88 y.o. [name], [name] Primary Care Sophiamarie Nease: Marda Stalker Other Clinician: Referring Georgi Navarrete: Treating Carson Meche/Extender: Aviva Signs in Treatment: 30 Vital Signs Time Taken: 10:05 Temperature (F): 98.3 Height (in): 66 Pulse (bpm): 73 Weight (lbs): 176 Respiratory Rate (breaths/min): 17 Body Mass Index (BMI): 28.4 Blood Pressure (mmHg): 151/73 Reference Range: 80 - 120 mg / dl Electronic Signature(s) Signed: 08/10/2022 4:16:42 PM By: [name] Entered By: Rhae Hammock on 08/10/2022 10:05:53

## 2022-08-17 ENCOUNTER — Encounter (HOSPITAL_BASED_OUTPATIENT_CLINIC_OR_DEPARTMENT_OTHER): Payer: Medicare PPO | Admitting: Internal Medicine

## 2022-08-17 DIAGNOSIS — Z8542 Personal history of malignant neoplasm of other parts of uterus: Secondary | ICD-10-CM | POA: Diagnosis not present

## 2022-08-17 DIAGNOSIS — L97822 Non-pressure chronic ulcer of other part of left lower leg with fat layer exposed: Secondary | ICD-10-CM

## 2022-08-17 DIAGNOSIS — Z853 Personal history of malignant neoplasm of breast: Secondary | ICD-10-CM | POA: Diagnosis not present

## 2022-08-17 DIAGNOSIS — I87312 Chronic venous hypertension (idiopathic) with ulcer of left lower extremity: Secondary | ICD-10-CM

## 2022-08-17 DIAGNOSIS — G309 Alzheimer's disease, unspecified: Secondary | ICD-10-CM

## 2022-08-17 DIAGNOSIS — I5042 Chronic combined systolic (congestive) and diastolic (congestive) heart failure: Secondary | ICD-10-CM

## 2022-08-17 DIAGNOSIS — I110 Hypertensive heart disease with heart failure: Secondary | ICD-10-CM | POA: Diagnosis not present

## 2022-08-17 DIAGNOSIS — F0280 Dementia in other diseases classified elsewhere without behavioral disturbance: Secondary | ICD-10-CM | POA: Diagnosis not present

## 2022-08-17 NOTE — Progress Notes (Signed)
[name] ([id number]) Visit Report for 08/17/2022 Arrival Information Details Patient Name: Date of Service: [name], [name] 08/17/2022 10:45 A M Medical Record Number: [id number] Patient Account Number: [id number] Date of Birth/Sex: Treating RN: 05-May-1925 (88 y.o. [name], [name] Primary Care Niv Darley: [name] Other Clinician: Referring [name]: Treating [name]/Extender: [name]: 9 Visit Information History Since Last Visit Added or deleted any medications: No Patient Arrived: Wheel Chair Any new allergies or adverse reactions: No Arrival Time: 10:54 Had a fall or experienced change in No Accompanied By: daughter activities of daily living that may affect Transfer Assistance: Manual risk of falls: Patient Identification Verified: Yes Signs or symptoms of abuse/neglect since last visito No Secondary Verification Process Completed: Yes Hospitalized since last visit: No Patient Requires Transmission-Based Precautions: No Implantable device outside of the clinic excluding No Patient Has Alerts: No cellular tissue based products placed in the center since last visit: Has Dressing in Place as Prescribed: Yes Has Compression in Place as Prescribed: Yes Pain Present Now: No Electronic Signature(s) Signed: 08/17/2022 5:27:41 PM By: [name] Pilling RN, BSN Entered By: [name] on 08/17/2022 10:54:40 -------------------------------------------------------------------------------- Clinic Level of Care Assessment Details Patient Name: Date of Service: [name] 08/17/2022 10:45 A M Medical Record Number: [id number] Patient Account Number: [id number] Date of Birth/Sex: Treating RN: Aug 02, 1925 (88 y.o. [name], [name] Primary Care [name]: [name] Other Clinician: Referring [name]: Treating [name]/Extender: [name]: 26 Clinic Level of Care  Assessment Items TOOL 4 Quantity Score X- 1 0 Use when only an EandM is performed on FOLLOW-UP visit ASSESSMENTS - Nursing Assessment / Reassessment X- 1 10 Reassessment of Co-morbidities (includes updates in patient status) X- 1 5 Reassessment of Adherence to [name] Plan ASSESSMENTS - Wound and Skin A ssessment / Reassessment X - Simple Wound Assessment / Reassessment - one wound 1 5 '[]'$  - 0 Complex Wound Assessment / Reassessment - multiple wounds '[]'$  - 0 Dermatologic / Skin Assessment (not related to wound area) ASSESSMENTS - Focused Assessment X- 1 5 Circumferential Edema Measurements - multi extremities X- 1 10 Nutritional Assessment / Counseling / Intervention '[]'$  - 0 Lower Extremity Assessment (monofilament, tuning fork, pulses) '[]'$  - 0 Peripheral Arterial Disease Assessment (using hand held doppler) ASSESSMENTS - Ostomy and/or Continence Assessment and Care '[]'$  - 0 Incontinence Assessment and Management '[]'$  - 0 Ostomy Care Assessment and Management (repouching, etc.) PROCESS - Coordination of Care X - Simple Patient / Family Education for ongoing care 1 15 '[]'$  - 0 Complex (extensive) Patient / Family Education for ongoing care X- 1 10 Staff obtains Programmer, systems, Records, T Results / Process Orders est X- 1 10 Staff telephones HHA, Nursing Homes / Clarify orders / etc '[]'$  - 0 Routine Transfer to another Facility (non-emergent condition) '[]'$  - 0 Routine Hospital Admission (non-emergent condition) '[]'$  - 0 New Admissions / Biomedical engineer / Ordering NPWT Apligraf, etc. , '[]'$  - 0 Emergency Hospital Admission (emergent condition) X- 1 10 Simple Discharge Coordination '[]'$  - 0 Complex (extensive) Discharge Coordination PROCESS - Special Needs '[]'$  - 0 Pediatric / Minor Patient Management '[]'$  - 0 Isolation Patient Management '[]'$  - 0 Hearing / Language / Visual special needs '[]'$  - 0 Assessment of Community assistance (transportation, D/C planning, etc.) '[]'$  -  0 Additional assistance / Altered mentation '[]'$  - 0 Support Surface(s) Assessment (bed, cushion, seat, etc.) INTERVENTIONS - Wound Cleansing / Measurement X - Simple Wound Cleansing - one wound 1 5 '[]'$  -  0 Complex Wound Cleansing - multiple wounds X- 1 5 Wound Imaging (photographs - any number of wounds) '[]'$  - 0 Wound Tracing (instead of photographs) X- 1 5 Simple Wound Measurement - one wound '[]'$  - 0 Complex Wound Measurement - multiple wounds INTERVENTIONS - Wound Dressings X - Small Wound Dressing one or multiple wounds 1 10 '[]'$  - 0 Medium Wound Dressing one or multiple wounds '[]'$  - 0 Large Wound Dressing one or multiple wounds X- 1 5 Application of Medications - topical '[]'$  - 0 Application of Medications - injection INTERVENTIONS - Miscellaneous '[]'$  - 0 External ear exam '[]'$  - 0 Specimen Collection (cultures, biopsies, blood, body fluids, etc.) '[]'$  - 0 Specimen(s) / Culture(s) sent or taken to Lab for analysis '[]'$  - 0 Patient Transfer (multiple staff / Civil Service fast streamer / Similar devices) '[]'$  - 0 Simple Staple / Suture removal (25 or less) '[]'$  - 0 Complex Staple / Suture removal (26 or more) '[]'$  - 0 Hypo / Hyperglycemic Management (close monitor of Blood Glucose) '[]'$  - 0 Ankle / Brachial Index (ABI) - do not check if billed separately X- 1 5 Vital Signs Has the patient been seen at the hospital within the last three years: Yes Total Score: 115 Level Of Care: New/Established - Level 3 Electronic Signature(s) Signed: 08/17/2022 4:47:31 PM By: [name] Entered By: Rhae Hammock on 08/17/2022 11:17:52 -------------------------------------------------------------------------------- Encounter Discharge Information Details Patient Name: Date of Service: [name], [name] [name] 08/17/2022 10:45 A M Medical Record Number: [id number] Patient Account Number: [id number] Date of Birth/Sex: Treating RN: 1925-07-16 (88 y.o. [name], [name] Primary Care Elizardo Chilson: [name]  Other Clinician: Referring Daveyon Kitchings: Treating Gwenevere Goga/Extender: [name]: 37 Encounter Discharge Information Items Discharge Condition: Stable Ambulatory Status: Wheelchair Discharge Destination: Home Transportation: Private Auto Accompanied By: daughter Schedule Follow-up Appointment: Yes Clinical Summary of Care: Patient Declined Electronic Signature(s) Signed: 08/17/2022 4:47:31 PM By: [name] Entered By: Rhae Hammock on 08/17/2022 11:18:39 -------------------------------------------------------------------------------- Lower Extremity Assessment Details Patient Name: Date of Service: [name], [name] 08/17/2022 10:45 A M Medical Record Number: [id number] Patient Account Number: [id number] Date of Birth/Sex: Treating RN: [birth year] (88 y.o. [name], [name] Primary Care Delva Derden: [name] Other Clinician: Referring Martin Belling: Treating Nyko Gell/Extender: Marjo Bicker Weeks in [name]: 37 Edema Assessment Assessed: [name]: Yes] Patrice Paradise: No] Edema: [Left: N] [Right: o] Calf Left: Right: Point of Measurement: 30 cm From Medial Instep 34 cm Ankle Left: Right: Point of Measurement: 9 cm From Medial Instep 21 cm Vascular Assessment Pulses: Dorsalis Pedis Palpable: [Left:Yes] Electronic Signature(s) Signed: 08/17/2022 5:27:41 PM By: [name] Pilling RN, BSN Entered By: [name] on 08/17/2022 11:00:12 -------------------------------------------------------------------------------- Multi Wound Chart Details Patient Name: Date of Service: [name], [name] 08/17/2022 10:45 A M Medical Record Number: [id number] Patient Account Number: [id number] Date of Birth/Sex: Treating RN: 1925/11/01 (88 y.o. F) Primary Care Tanja Gift: [name] Other Clinician: Referring Julissa Browning: Treating Rodrickus Min/Extender: [name]: 37 Vital  Signs Height(in): 25 Pulse(bpm): 64 Weight(lbs): 176 Blood Pressure(mmHg): 157/72 Body Mass Index(BMI): 28.4 Temperature(F): 98.3 Respiratory Rate(breaths/min): 20 Photos: [N/A:N/A] Left, Lateral Ankle N/A N/A Wound Location: Gradually Appeared N/A N/A Wounding Event: Venous Leg Ulcer N/A N/A Primary Etiology: Anemia, Congestive Heart Failure, N/A N/A Comorbid History: Hypertension, Peripheral Venous Disease, Osteoarthritis, Received Chemotherapy 11/20/2021 N/A N/A Date Acquired: 23 N/A N/A Weeks of [name]: Open N/A N/A Wound Status: No N/A N/A Wound Recurrence: 0.1x0.1x0.1 N/A N/A Measurements L x W x D (cm) 0.008 N/A  N/A A (cm) : rea 0.001 N/A N/A Volume (cm) : 99.90% N/A N/A % Reduction in Area: 99.90% N/A N/A % Reduction in Volume: Full Thickness Without Exposed N/A N/A Classification: Support Structures None Present N/A N/A Exudate Amount: Distinct, outline attached N/A N/A Wound Margin: None Present (0%) N/A N/A Granulation Amount: None Present (0%) N/A N/A Necrotic Amount: Fascia: No N/A N/A Exposed Structures: Fat Layer (Subcutaneous Tissue): No Tendon: No Muscle: No Joint: No Bone: No Large (67-100%) N/A N/A Epithelialization: [name] Notes Electronic Signature(s) Signed: 08/17/2022 11:12:17 AM By: Kalman Shan DO Entered By: Kalman Shan on 08/17/2022 11:10:08 -------------------------------------------------------------------------------- Multi-Disciplinary Care Plan Details Patient Name: Date of Service: [name], [name] 08/17/2022 10:45 A M Medical Record Number: [id number] Patient Account Number: [id number] Date of Birth/Sex: Treating RN: [birth year] (88 y.o. [name], [name] Primary Care Malayja Freund: [name] Other Clinician: Referring Starletta Houchin: Treating Irish Breisch/Extender: Marjo Bicker Weeks in [name]: 37 Active Inactive Electronic Signature(s) Signed: 08/17/2022 4:47:31 PM  By: [name] Entered By: Rhae Hammock on 08/17/2022 11:08:34 -------------------------------------------------------------------------------- Pain Assessment Details Patient Name: Date of Service: [name], [name] 08/17/2022 10:45 A M Medical Record Number: [id number] Patient Account Number: [id number] Date of Birth/Sex: Treating RN: 1925/04/28 (88 y.o. [name] Primary Care Faiga Stones: [name] Other Clinician: Referring Freemon Binford: Treating Kamarah Bilotta/Extender: [name]: 37 Active Problems Location of Pain Severity and Description of Pain Patient Has Paino No Site Locations Rate the pain. Current Pain Level: 0 Pain Management and Medication Current Pain Management: Medication: No Cold Application: No Rest: No Massage: No Activity: No T.E.N.S.: No Heat Application: No Leg drop or elevation: No Is the Current Pain Management Adequate: Adequate How does your wound impact your activities of daily livingo Sleep: No Bathing: No Appetite: No Relationship With Others: No Bladder Continence: No Emotions: No Bowel Continence: No Work: No Toileting: No Drive: No Dressing: No Hobbies: No Engineer, maintenance) Signed: 08/17/2022 5:27:41 PM By: [name] Pilling RN, BSN Entered By: [name] on 08/17/2022 10:58:39 -------------------------------------------------------------------------------- Patient/Caregiver Education Details Patient Name: Date of Service: [name] 9/26/2023andnbsp10:45 A M Medical Record Number: [id number] Patient Account Number: [id number] Date of Birth/Gender: Treating RN: 07-04-1925 (88 y.o. [name], [name] Primary Care Physician: [name] Other Clinician: Referring Physician: Treating Physician/Extender: [name]: 70 Education Assessment Education Provided To: Patient Education Topics  Provided Wound/Skin Impairment: Methods: Explain/Verbal Responses: State content correctly Electronic Signature(s) Signed: 08/17/2022 4:47:31 PM By: [name] Entered By: Rhae Hammock on 08/17/2022 11:08:49 -------------------------------------------------------------------------------- Wound Assessment Details Patient Name: Date of Service: [name], [name] 08/17/2022 10:45 A M Medical Record Number: [id number] Patient Account Number: [id number] Date of Birth/Sex: Treating RN: 16-Jul-1925 (88 y.o. [name], Meta.Reding Primary Care Ticara Waner: [name] Other Clinician: Referring Joshiah Traynham: Treating Allyanna Appleman/Extender: Marjo Bicker Weeks in [name]: 37 Wound Status Wound Number: [id number] Primary Venous Leg Ulcer Etiology: Wound Location: Left, Lateral Ankle Wound Open Wounding Event: Gradually Appeared Status: Date Acquired: 11/20/2021 Comorbid Anemia, Congestive Heart Failure, Hypertension, Peripheral Weeks Of [name]: 37 History: Venous Disease, Osteoarthritis, Received Chemotherapy Clustered Wound: No Photos Wound Measurements Length: (cm) 0.1 Width: (cm) 0.1 Depth: (cm) 0.1 Area: (cm) 0.008 Volume: (cm) 0.001 % Reduction in Area: 99.9% % Reduction in Volume: 99.9% Epithelialization: Large (67-100%) Tunneling: No Undermining: No Wound Description Classification: Full Thickness Without Exposed Support Structu Wound Margin: Distinct, outline attached Exudate Amount: None Present res Foul Odor After Cleansing: No Slough/Fibrino No Wound Bed Granulation Amount: None  Present (0%) Exposed Structure Necrotic Amount: None Present (0%) Fascia Exposed: No Fat Layer (Subcutaneous Tissue) Exposed: No Tendon Exposed: No Muscle Exposed: No Joint Exposed: No Bone Exposed: No [name] Notes Wound #26 (Ankle) Wound Laterality: Left, Lateral Cleanser Peri-Wound Care Topical Primary Dressing Secondary Dressing Secured  With Compression Wrap Compression Stockings Add-Ons Electronic Signature(s) Signed: 08/17/2022 5:27:41 PM By: [name] Pilling RN, BSN Entered By: [name] on 08/17/2022 11:04:04 -------------------------------------------------------------------------------- [place] Details Patient Name: Date of Service: [name], [name] [name] 08/17/2022 10:45 A M Medical Record Number: [id number] Patient Account Number: [id number] Date of Birth/Sex: Treating RN: 1925/11/16 (88 y.o. [name], [name] Primary Care Kanika Bungert: [name] Other Clinician: Referring Obera Stauch: Treating Zeshan Sena/Extender: [name]: 37 Vital Signs Time Taken: 10:50 Temperature (F): 98.3 Height (in): 66 Pulse (bpm): 65 Weight (lbs): 176 Respiratory Rate (breaths/min): 20 Body Mass Index (BMI): 28.4 Blood Pressure (mmHg): 157/72 Reference Range: 80 - 120 mg / dl Electronic Signature(s) Signed: 08/17/2022 5:27:41 PM By: [name] Pilling RN, BSN Entered By: [name] on 08/17/2022 10:56:02

## 2022-08-17 NOTE — Progress Notes (Addendum)
[name], [name] ([id number]) Visit Report for 08/17/2022 Chief Complaint Document Details Patient Name: Date of Service: [name], [name] 08/17/2022 10:45 A M Medical Record Number: [id number] Patient Account Number: [id number] Date of Birth/Sex: Treating RN: 11/26/1924 (88 y.o. F) Primary Care Provider: Marda Stalker Other Clinician: Referring Provider: Treating Provider/Extender: Aviva Signs in Treatment: 33 Information Obtained from: Patient Chief Complaint Left lower extremity wound Electronic Signature(s) Signed: 08/17/2022 11:12:17 AM By: Kalman Shan DO Entered By: Kalman Shan on 08/17/2022 11:10:14 -------------------------------------------------------------------------------- HPI Details Patient Name: Date of Service: [name], REBER [name] 08/17/2022 10:45 A M Medical Record Number: [id number] Patient Account Number: [id number] Date of Birth/Sex: Treating RN: 1925-08-23 (88 y.o. F) Primary Care Provider: Marda Stalker Other Clinician: Referring Provider: Treating Provider/Extender: Aviva Signs in Treatment: 7 History of Present Illness HPI Description: this is a patient we know from several prior wounds on her bilateral lower extremities. She has venous stasis physiology, inflammation and hypertension. She is been fully evaluated for venous ablation and was found not to be a candidate. She does not have significant arterial disease. 02/14/15 the wound itself on her lateral left leg did not look too bad however there was surrounding maceration which was concerning 02/27/15 the wound itself is small with surrounding circumferential epithelialization there is no surrounding maceration. 03/06/15 only a very small area remains. I think this is mostly epithelialized however I would be uncomfortable not dressing this this week 03/13/15 the area is epithelialized. There was a thick surface on this. I took a #15  blade then lightly disrupted it but there does not appear to be any open area I did not see anything but appropriate epithelium Readmission: 08/16/18 on evaluation today patient presents for initial evaluation and our clinic released readmission although she has not been seen since April 2016. Nonetheless she is having issues with the ulcers currently on the bilateral lateral malleolus or locations as well as the left lateral lower extremity. These have been present for roughly 3 weeks since the patient actually developed significant bilateral lower extremity edema which patient's daughter states was roughly 3 times the size of her legs currently. Subsequently she ended up in the emerging department due to congestive heart failure. They were able to get the swelling under control and fortunately she seems to be doing much better at this point in time. She was placed in an [employer] which she sat on for week part evaluation today as well. Fortunately the wound areas actually seem to be doing fairly well there is some macerated skin surrounding there are the areas/surface of the wound which are gonna require some debridement at this point. No fevers, chills, nausea, or vomiting noted at this time. Patient has a history of hypertension, mild peripheral vascular disease, chronic venous stasis, and other than the ulcer seems to be doing fairly well today. No fevers, chills, nausea, or vomiting noted at this time. 08/23/18 on evaluation today patient actually appears to be doing great in regard to her bilateral lower should be ulcers. She's shown signs of improvement even just with one week with the [employer]. In general I'm actually very pleased with how things appear at this point. 09/06/18 on evaluation today patient actually appears to be doing much better her left lower extremity is completely healed. Go to the right lower extremity lateral malleolus ulcer this still seems to be showing signs  of being open. There does not appear to be evidence of infection which is  good news. This did require some sharp debridement however to remove some of the necrotic tissue/eschar on the surface of the wound Readmission 11/27/2021 [name] is a 88 year old female with a past medical history of endometrial and breast cancer, chronic venous insufficiency, dementia and essential hypertension that presents to the clinic with a 1 month history of nonhealing ulcer to the left lower extremity. She states that the ulcer opened 1 month ago and then healed but has reopened again in the past week. She is currently keeping the area covered. She has a history of wounds to her lower extremities. She has compression stockings that she uses daily. She currently denies signs of infection. 1/13; patient presents for follow-up. She followed up for nurse visit for wrap change. She reports more odor. She denies pain. 1/20; patient presents for follow-up. She has been using gentamicin ointment with calcium alginate to the wound bed. She reports improvement in odor and drainage. She currently denies systemic signs of infection. 1/27; patient presents for follow-up. She has been using gentamicin ointment with calcium alginate. She received her Keystone antibiotic 2 days ago. She brought this in. Home health has also been established. They will come out for the first time next week. She currently denies signs of infection. She has no issues or complaints today. 2/2; patient presents for follow-up. She has been using Keystone antibiotics with [facility] with no issues. She denies signs of infection. She has no issues or complaints today. 2/9; patient presents for follow-up. She has been using Keystone antibiotics with [facility] with no issues. She has tolerated the compression wrap well. She denies signs of infection. 2/16; patient presents for follow-up. She has been using Keystone antibiotics with  Hydrofera Blue under compression wrap with no issues. She currently denies signs of infection. 2/23; patient presents for follow-up. She continues to use Keystone antibiotics with Hydrofera Blue under the compression wrap. She has no issues or complaints today. She denies signs of infection. Home health continues to come out and change the wrap. 3/2; patient presents for follow-up. She continues to use [facility] with [facility] under compression with no issues. She denies signs of infection. 3/16; patient presents for follow-up. She continues to use [facility] with [facility] under compression with no issues. Home health change the dressing last week. She denies signs of infection. 3/23; patient presents for follow-up. She continues to use Keystone antibiotics with Hydrofera Blue under compression with no issues. Home health continues to change the dressings. She denies signs of infection. 3/30; patient presents for follow-up. She continues to use Keystone antibiotics with Hydrofera Blue under compression with no issues. Home health continues to change the dressings. 4/13 2-week follow-up. Keystone and Hydrofera Blue wound is roughly 2.3 cm in diameter smaller. No other issues 4/20; patient presents for follow-up. She continues to use Keystone antibiotic with [facility] with no issues. 4/28; patient with a difficult wound on her right medial lower leg. Using [facility] Blue minimal improvement per our intake nurse we are using compression. She has home health changing the dressing 5/5; patient presents for follow-up. We have been using Keystone antibiotics and Hydrofera Blue under compression therapy. She has no issues or complaints today. We discussed potentially using an advanced tissue product. She states she has had this done in the past and would like to proceed to see what her insurance would cover. 5/15; patient presents for follow-up. She has been using Keystone  antibiotics and Hydrofera Blue under compression  therapy. She forgot her Keystone antibiotics today. Insurance has not approved her for skin substitute. She currently denies signs of infection. She has home health that changes the dressing once a week. 5/19; patient presents for follow-up. We have been using Keystone antibiotic and Hydrofera Blue under compression therapy. She has no issues or complaints today. The [employer] rep confirmed over the phone that the patient has been 100% approved for their skin substitute. We will verify this as we have not received any paperwork stating this. Patient states she would like to proceed with a skin substitute if it is covered by insurance. 6/1; patient presents for follow-up. We have been using Keystone antibiotic and Hydrofera Blue under compression therapy. We confirmed that she does have a co-pay for the [employer] skin substitute. We will hold off on using this for now since she continues to show improvement with current therapy. She denies signs of infection. She has home health that comes out twice weekly. 6/16; patient presents for follow-up. We have been using Keystone antibiotic and Hydrofera Blue under compression therapy. She has no issues or complaints today. We discussed the potential trial of vendaje. This is placenta tissue. She states she would like to try the free trial. We will reach out to our rep. 6/30; patient presents for follow-up. She has been using Keystone antibiotic and Hydrofera Blue under compression therapy. She has been approved for a trial of vendaje. She would like to proceed with this and We will have this available at next clinic visit. She has no issues or complaints today. 7/18; patient presents for follow-up. She has been using Keystone antibiotic and Hydrofera Blue under compression. She has no issues or complaints today. We have a free sample of vendaje skin substitute and patient is agreeable to having this placed  today. 7/24; patient presents for follow-up. We placed the first free trial of vendaje last week under compression wrap. Patient had no issues or complaints today. She tolerated this well. 8/1; patient presents for follow-up. Vandaje #2 donated product was placed in standard fashion last week under compression therapy. She has no issues or complaints today. 8/8; patient presents for follow-up. Vendaje #3 donated skin set was placed in standard fashion last visit under compression therapy. She has no issues or complaints today. 8/15; Patient presents for follow-up. Vandaje #4 was placed in standard fashion last week under compression therapy. She has no issues or complaints today. 8/22; patient presents for follow-up. Vandaje #5 was placed in standard fashion last week under compression therapy. She tolerated this well. 8/29; patient presents for follow-up. We have been using Xeroform under compression therapy and she has tolerated this well. She has no issues or complaints today. She has home health that is been coming out weekly For dressing changes. 9/12; patient presents for follow-up. We have been using Xeroform under compression therapy. Home health has been changing the dressings. She has no issues or complaints today. 9/19; patient presents for follow-up. We have been using Xeroform under compression therapy. She has no issues or complaints today. 9/26; patient presents for follow-up. We have been using Xeroform under compression therapy. She has no issues or complaints today. Electronic Signature(s) Signed: 08/17/2022 11:12:17 AM By: Kalman Shan DO Entered By: Kalman Shan on 08/17/2022 11:10:32 -------------------------------------------------------------------------------- Physical Exam Details Patient Name: Date of Service: DESIREA, MIZRAHI [name] 08/17/2022 10:45 A M Medical Record Number: [id number] Patient Account Number: [id number] Date of Birth/Sex: Treating RN: Mar 03, 1925 (88  y.o. F) Primary Care Provider: Marda Stalker Other  Clinician: Referring Provider: Treating Provider/Extender: Aviva Signs in Treatment: 37 Constitutional respirations regular, non-labored and within target range for patient.. Cardiovascular 2+ dorsalis pedis/posterior tibialis pulses. Psychiatric pleasant and cooperative. Notes Left lower extremity: T the lateral aspect there is epithelization to the previous wound site. o Electronic Signature(s) Signed: 08/17/2022 11:12:17 AM By: Kalman Shan DO Entered By: Kalman Shan on 08/17/2022 11:10:57 -------------------------------------------------------------------------------- Physician Orders Details Patient Name: Date of Service: NELWYN, HEBDON [name] 08/17/2022 10:45 A M Medical Record Number: [id number] Patient Account Number: [id number] Date of Birth/Sex: Treating RN: August 17, 1925 (88 y.o. [name], [name]: Marda Stalker Other Clinician: Referring Provider: Treating Provider/Extender: Aviva Signs in Treatment: 7 Verbal / Phone Orders: No Diagnosis Coding Discharge From [facility] Discharge from [facility] Edema Control - Lymphedema / SCD / Other Elevate legs to the level of the heart or above for 30 minutes daily and/or when sitting, a frequency of: Avoid standing for long periods of time. Patient to wear own compression stockings every day. Wound Treatment Electronic Signature(s) Signed: 08/17/2022 11:12:17 AM By: Kalman Shan DO Entered By: Kalman Shan on 08/17/2022 11:11:04 -------------------------------------------------------------------------------- Problem List Details Patient Name: Date of Service: ELGA, SANTY [name] 08/17/2022 10:45 A M Medical Record Number: [id number] Patient Account Number: [id number] Date of Birth/Sex: Treating RN: [birth year] (88 y.o. F) Primary Care Provider: Marda Stalker Other Clinician: Referring Provider: Treating Provider/Extender: Aviva Signs in Treatment: 37 Active Problems ICD-10 Encounter Code Description Active Date MDM Diagnosis I87.312 Chronic venous hypertension (idiopathic) with ulcer of left lower extremity 11/27/2021 No Yes L97.822 Non-pressure chronic ulcer of other part of left lower leg with fat layer exposed1/04/2022 No Yes C50.919 Malignant neoplasm of unspecified site of unspecified female breast 11/27/2021 No Yes I50.42 Chronic combined systolic (congestive) and diastolic (congestive) heart failure 11/27/2021 No Yes G30.9 Alzheimer's disease, unspecified 11/27/2021 No Yes C54.1 Malignant neoplasm of endometrium 11/27/2021 No Yes Inactive Problems Resolved Problems Electronic Signature(s) Signed: 08/17/2022 11:12:17 AM By: Kalman Shan DO Entered By: Kalman Shan on 08/17/2022 11:10:03 -------------------------------------------------------------------------------- Progress Note Details Patient Name: Date of Service: ELINE, GENG [name] 08/17/2022 10:45 A M Medical Record Number: [id number] Patient Account Number: [id number] Date of Birth/Sex: Treating RN: 28-Mar-1925 (88 y.o. F) Primary Care Provider: Marda Stalker Other Clinician: Referring Provider: Treating Provider/Extender: Aviva Signs in Treatment: 60 Subjective Chief Complaint Information obtained from Patient Left lower extremity wound History of Present Illness (HPI) this is a patient we know from several prior wounds on her bilateral lower extremities. She has venous stasis physiology, inflammation and hypertension. She is been fully evaluated for venous ablation and was found not to be a candidate. She does not have significant arterial disease. 02/14/15 the wound itself on her lateral left leg did not look too bad however there was surrounding maceration which was concerning 02/27/15 the wound  itself is small with surrounding circumferential epithelialization there is no surrounding maceration. 03/06/15 only a very small area remains. I think this is mostly epithelialized however I would be uncomfortable not dressing this this week 03/13/15 the area is epithelialized. There was a thick surface on this. I took a #15 blade then lightly disrupted it but there does not appear to be any open area I did not see anything but appropriate epithelium Readmission: 08/16/18 on evaluation today patient presents for initial evaluation and our clinic released readmission although she has not been seen since April 2016. Nonetheless she is having issues with the  ulcers currently on the bilateral lateral malleolus or locations as well as the left lateral lower extremity. These have been present for roughly 3 weeks since the patient actually developed significant bilateral lower extremity edema which patient's daughter states was roughly 3 times the size of her legs currently. Subsequently she ended up in the emerging department due to congestive heart failure. They were able to get the swelling under control and fortunately she seems to be doing much better at this point in time. She was placed in an [employer] which she sat on for week part evaluation today as well. Fortunately the wound areas actually seem to be doing fairly well there is some macerated skin surrounding there are the areas/surface of the wound which are gonna require some debridement at this point. No fevers, chills, nausea, or vomiting noted at this time. Patient has a history of hypertension, mild peripheral vascular disease, chronic venous stasis, and other than the ulcer seems to be doing fairly well today. No fevers, chills, nausea, or vomiting noted at this time. 08/23/18 on evaluation today patient actually appears to be doing great in regard to her bilateral lower should be ulcers. She's shown signs of improvement even just with  one week with the [employer]. In general I'm actually very pleased with how things appear at this point. 09/06/18 on evaluation today patient actually appears to be doing much better her left lower extremity is completely healed. Go to the right lower extremity lateral malleolus ulcer this still seems to be showing signs of being open. There does not appear to be evidence of infection which is good news. This did require some sharp debridement however to remove some of the necrotic tissue/eschar on the surface of the wound Readmission 11/27/2021 Ms. Shalandria Elsbernd is a 88 year old female with a past medical history of endometrial and breast cancer, chronic venous insufficiency, dementia and essential hypertension that presents to the clinic with a 1 month history of nonhealing ulcer to the left lower extremity. She states that the ulcer opened 1 month ago and then healed but has reopened again in the past week. She is currently keeping the area covered. She has a history of wounds to her lower extremities. She has compression stockings that she uses daily. She currently denies signs of infection. 1/13; patient presents for follow-up. She followed up for nurse visit for wrap change. She reports more odor. She denies pain. 1/20; patient presents for follow-up. She has been using gentamicin ointment with calcium alginate to the wound bed. She reports improvement in odor and drainage. She currently denies systemic signs of infection. 1/27; patient presents for follow-up. She has been using gentamicin ointment with calcium alginate. She received her Keystone antibiotic 2 days ago. She brought this in. Home health has also been established. They will come out for the first time next week. She currently denies signs of infection. She has no issues or complaints today. 2/2; patient presents for follow-up. She has been using Keystone antibiotics with [facility] with no issues. She denies signs of  infection. She has no issues or complaints today. 2/9; patient presents for follow-up. She has been using Keystone antibiotics with [facility] with no issues. She has tolerated the compression wrap well. She denies signs of infection. 2/16; patient presents for follow-up. She has been using Keystone antibiotics with Hydrofera Blue under compression wrap with no issues. She currently denies signs of infection. 2/23; patient presents for follow-up. She continues to use Keystone  antibiotics with Hydrofera Blue under the compression wrap. She has no issues or complaints today. She denies signs of infection. Home health continues to come out and change the wrap. 3/2; patient presents for follow-up. She continues to use [facility] with [facility] under compression with no issues. She denies signs of infection. 3/16; patient presents for follow-up. She continues to use [facility] with [facility] under compression with no issues. Home health change the dressing last week. She denies signs of infection. 3/23; patient presents for follow-up. She continues to use Keystone antibiotics with Hydrofera Blue under compression with no issues. Home health continues to change the dressings. She denies signs of infection. 3/30; patient presents for follow-up. She continues to use Keystone antibiotics with Hydrofera Blue under compression with no issues. Home health continues to change the dressings. 4/13 2-week follow-up. Keystone and Hydrofera Blue wound is roughly 2.3 cm in diameter smaller. No other issues 4/20; patient presents for follow-up. She continues to use Keystone antibiotic with [facility] with no issues. 4/28; patient with a difficult wound on her right medial lower leg. Using [facility] Blue minimal improvement per our intake nurse we are using compression. She has home health changing the dressing 5/5; patient presents for follow-up. We have been using Keystone antibiotics and  Hydrofera Blue under compression therapy. She has no issues or complaints today. We discussed potentially using an advanced tissue product. She states she has had this done in the past and would like to proceed to see what her insurance would cover. 5/15; patient presents for follow-up. She has been using Keystone antibiotics and Hydrofera Blue under compression therapy. She forgot her Keystone antibiotics today. Insurance has not approved her for skin substitute. She currently denies signs of infection. She has home health that changes the dressing once a week. 5/19; patient presents for follow-up. We have been using Keystone antibiotic and Hydrofera Blue under compression therapy. She has no issues or complaints today. The [employer] rep confirmed over the phone that the patient has been 100% approved for their skin substitute. We will verify this as we have not received any paperwork stating this. Patient states she would like to proceed with a skin substitute if it is covered by insurance. 6/1; patient presents for follow-up. We have been using Keystone antibiotic and Hydrofera Blue under compression therapy. We confirmed that she does have a co-pay for the [employer] skin substitute. We will hold off on using this for now since she continues to show improvement with current therapy. She denies signs of infection. She has home health that comes out twice weekly. 6/16; patient presents for follow-up. We have been using Keystone antibiotic and Hydrofera Blue under compression therapy. She has no issues or complaints today. We discussed the potential trial of vendaje. This is placenta tissue. She states she would like to try the free trial. We will reach out to our rep. 6/30; patient presents for follow-up. She has been using Keystone antibiotic and Hydrofera Blue under compression therapy. She has been approved for a trial of vendaje. She would like to proceed with this and We will have this available at  next clinic visit. She has no issues or complaints today. 7/18; patient presents for follow-up. She has been using Keystone antibiotic and Hydrofera Blue under compression. She has no issues or complaints today. We have a free sample of vendaje skin substitute and patient is agreeable to having this placed today. 7/24; patient presents for follow-up. We placed the first free  trial of vendaje last week under compression wrap. Patient had no issues or complaints today. She tolerated this well. 8/1; patient presents for follow-up. Vandaje #2 donated product was placed in standard fashion last week under compression therapy. She has no issues or complaints today. 8/8; patient presents for follow-up. Vendaje #3 donated skin set was placed in standard fashion last visit under compression therapy. She has no issues or complaints today. 8/15; Patient presents for follow-up. Vandaje #4 was placed in standard fashion last week under compression therapy. She has no issues or complaints today. 8/22; patient presents for follow-up. Vandaje #5 was placed in standard fashion last week under compression therapy. She tolerated this well. 8/29; patient presents for follow-up. We have been using Xeroform under compression therapy and she has tolerated this well. She has no issues or complaints today. She has home health that is been coming out weekly For dressing changes. 9/12; patient presents for follow-up. We have been using Xeroform under compression therapy. Home health has been changing the dressings. She has no issues or complaints today. 9/19; patient presents for follow-up. We have been using Xeroform under compression therapy. She has no issues or complaints today. 9/26; patient presents for follow-up. We have been using Xeroform under compression therapy. She has no issues or complaints today. Patient History Information obtained from Patient. Family History Cancer - Child, Hypertension - Child,Father,  Kidney Disease - Child, Stroke - Siblings,Child, Thyroid Problems - Child, No family history of Diabetes, Heart Disease, Hereditary Spherocytosis, Lung Disease, Seizures, Tuberculosis. Social History Never smoker, Marital Status - Widowed, Alcohol Use - Never, Drug Use - No History, Caffeine Use - Never. Medical History Eyes Denies history of Cataracts, Glaucoma, Optic Neuritis Ear/Nose/Mouth/Throat Denies history of Chronic sinus problems/congestion, Middle ear problems Hematologic/Lymphatic Patient has history of Anemia Denies history of Hemophilia, Human Immunodeficiency Virus, Lymphedema, Sickle Cell Disease Respiratory Denies history of Aspiration, Asthma, Chronic Obstructive Pulmonary Disease (COPD), Pneumothorax, Sleep Apnea, Tuberculosis Cardiovascular Patient has history of Congestive Heart Failure, Hypertension, Peripheral Venous Disease Denies history of Angina, Arrhythmia, Coronary Artery Disease, Hypotension, Myocardial Infarction, Peripheral Arterial Disease, Phlebitis, Vasculitis Gastrointestinal Denies history of Cirrhosis , Colitis, Crohnoos, Hepatitis A, Hepatitis B, Hepatitis C Endocrine Denies history of Type I Diabetes, Type II Diabetes Genitourinary Denies history of End Stage Renal Disease Immunological Denies history of Lupus Erythematosus, Raynaudoos, Scleroderma Integumentary (Skin) Denies history of History of Burn Musculoskeletal Patient has history of Osteoarthritis Denies history of Gout, Rheumatoid Arthritis, Osteomyelitis Neurologic Denies history of Neuropathy, Quadriplegia, Paraplegia, Seizure Disorder Oncologic Patient has history of Received Chemotherapy - Breast and Ovarian Cancer Denies history of Received Radiation Hospitalization/Surgery History - diverticulits. Objective Constitutional respirations regular, non-labored and within target range for patient.. Vitals Time Taken: 10:50 AM, Height: 66 in, Weight: 176 lbs, BMI: 28.4,  Temperature: 98.3 F, Pulse: 65 bpm, Respiratory Rate: 20 breaths/min, Blood Pressure: 157/72 mmHg. Cardiovascular 2+ dorsalis pedis/posterior tibialis pulses. Psychiatric pleasant and cooperative. General Notes: Left lower extremity: T the lateral aspect there is epithelization to the previous wound site. o Integumentary (Hair, Skin) Wound #26 status is Open. Original cause of wound was Gradually Appeared. The date acquired was: 11/20/2021. The wound has been in treatment 37 weeks. The wound is located on the Left,Lateral Ankle. The wound measures 0.1cm length x 0.1cm width x 0.1cm depth; 0.008cm^2 area and 0.001cm^3 volume. There is no tunneling or undermining noted. There is a none present amount of drainage noted. The wound margin is distinct with the outline attached to the  wound base. There is no granulation within the wound bed. There is no necrotic tissue within the wound bed. Assessment Active Problems ICD-10 Chronic venous hypertension (idiopathic) with ulcer of left lower extremity Non-pressure chronic ulcer of other part of left lower leg with fat layer exposed Malignant neoplasm of unspecified site of unspecified female breast Chronic combined systolic (congestive) and diastolic (congestive) heart failure Alzheimer's disease, unspecified Malignant neoplasm of endometrium Patient has done well with Xeroform under compression therapy. Her wound is healed. I recommended she wear her compression stockings daily. She has these with her today. Follow-up as needed. Plan Discharge From [facility] Services: Discharge from [place] Edema Control - Lymphedema / SCD / Other: Elevate legs to the level of the heart or above for 30 minutes daily and/or when sitting, a frequency of: Avoid standing for long periods of time. Patient to wear own compression stockings every day. 1. Discharge from clinic due to closed wound 2. Follow-up as needed 3. Compression stockings daily Electronic  Signature(s) Signed: 08/17/2022 11:12:17 AM By: Kalman Shan DO Entered By: Kalman Shan on 08/17/2022 11:11:37 -------------------------------------------------------------------------------- HxROS Details Patient Name: Date of Service: IRA, BUSBIN [name] 08/17/2022 10:45 A M Medical Record Number: [id number] Patient Account Number: [id number] Date of Birth/Sex: Treating RN: October 18, 1925 (88 y.o. F) Primary Care Provider: Marda Stalker Other Clinician: Referring Provider: Treating Provider/Extender: Aviva Signs in Treatment: 24 Information Obtained From Patient Eyes Medical History: Negative for: Cataracts; Glaucoma; Optic Neuritis Ear/Nose/Mouth/Throat Medical History: Negative for: Chronic sinus problems/congestion; Middle ear problems Hematologic/Lymphatic Medical History: Positive for: Anemia Negative for: Hemophilia; Human Immunodeficiency Virus; Lymphedema; Sickle Cell Disease Respiratory Medical History: Negative for: Aspiration; Asthma; Chronic Obstructive Pulmonary Disease (COPD); Pneumothorax; Sleep Apnea; Tuberculosis Cardiovascular Medical History: Positive for: Congestive Heart Failure; Hypertension; Peripheral Venous Disease Negative for: Angina; Arrhythmia; Coronary Artery Disease; Hypotension; Myocardial Infarction; Peripheral Arterial Disease; Phlebitis; Vasculitis Gastrointestinal Medical History: Negative for: Cirrhosis ; Colitis; Crohns; Hepatitis A; Hepatitis B; Hepatitis C Endocrine Medical History: Negative for: Type I Diabetes; Type II Diabetes Genitourinary Medical History: Negative for: End Stage Renal Disease Immunological Medical History: Negative for: Lupus Erythematosus; Raynauds; Scleroderma Integumentary (Skin) Medical History: Negative for: History of Burn Musculoskeletal Medical History: Positive for: Osteoarthritis Negative for: Gout; Rheumatoid Arthritis; Osteomyelitis Neurologic Medical  History: Negative for: Neuropathy; Quadriplegia; Paraplegia; Seizure Disorder Oncologic Medical History: Positive for: Received Chemotherapy - Breast and Ovarian Cancer Negative for: Received Radiation Immunizations Pneumococcal Vaccine: Received Pneumococcal Vaccination: Yes Received Pneumococcal Vaccination On or After 60th Birthday: Yes Implantable Devices None Hospitalization / Surgery History Type of Hospitalization/Surgery diverticulits Family and Social History Cancer: Yes - Child; Diabetes: No; Heart Disease: No; Hereditary Spherocytosis: No; Hypertension: Yes - Child,Father; Kidney Disease: Yes - Child; Lung Disease: No; Seizures: No; Stroke: Yes - Siblings,Child; Thyroid Problems: Yes - Child; Tuberculosis: No; Never smoker; Marital Status - Widowed; Alcohol Use: Never; Drug Use: No History; Caffeine Use: Never; Financial Concerns: No; Food, Clothing or Shelter Needs: No; Support System Lacking: No; Transportation Concerns: No Electronic Signature(s) Signed: 08/17/2022 11:12:17 AM By: Kalman Shan DO Entered By: Kalman Shan on 08/17/2022 11:10:38 -------------------------------------------------------------------------------- SuperBill Details Patient Name: Date of Service: AMAKA, GLUTH [name] 08/17/2022 Medical Record Number: [id number] Patient Account Number: [id number] Date of Birth/Sex: Treating RN: 1925/08/27 (88 y.o. F) Primary Care Provider: Marda Stalker Other Clinician: Referring Provider: Treating Provider/Extender: Marjo Bicker Weeks in Treatment: 37 Diagnosis Coding ICD-10 Codes Code Description [phone] Chronic venous hypertension (idiopathic) with ulcer of left lower extremity L97.822 Non-pressure chronic  ulcer of other part of left lower leg with fat layer exposed C50.919 Malignant neoplasm of unspecified site of unspecified female breast I50.42 Chronic combined systolic (congestive) and diastolic (congestive) heart  failure G30.9 Alzheimer's disease, unspecified C54.1 Malignant neoplasm of endometrium Facility Procedures CPT4 Code: 71245809 Description: 99213 - WOUND CARE VISIT-LEV 3 EST PT Modifier: Quantity: 1 Physician Procedures : CPT4 Code Description Modifier [id number] - WC PHYS LEVEL 3 - EST PT ICD-10 Diagnosis Description I87.312 Chronic venous hypertension (idiopathic) with ulcer of left lower extremity L97.822 Non-pressure chronic ulcer of other part of left lower leg  with fat layer exposed I50.42 Chronic combined systolic (congestive) and diastolic (congestive) heart failure G30.9 Alzheimer's disease, unspecified Quantity: 1 Electronic Signature(s) Signed: 08/17/2022 12:33:27 PM By: Kalman Shan DO Signed: 08/17/2022 4:47:31 PM By: [name] Previous Signature: 08/17/2022 11:12:17 AM Version By: Kalman Shan DO Entered By: Rhae Hammock on 08/17/2022 11:18:00

## 2022-08-26 ENCOUNTER — Encounter (HOSPITAL_BASED_OUTPATIENT_CLINIC_OR_DEPARTMENT_OTHER): Payer: Medicare PPO | Attending: Internal Medicine | Admitting: Internal Medicine

## 2022-08-26 DIAGNOSIS — C541 Malignant neoplasm of endometrium: Secondary | ICD-10-CM | POA: Diagnosis not present

## 2022-08-26 DIAGNOSIS — G309 Alzheimer's disease, unspecified: Secondary | ICD-10-CM | POA: Insufficient documentation

## 2022-08-26 DIAGNOSIS — C50919 Malignant neoplasm of unspecified site of unspecified female breast: Secondary | ICD-10-CM | POA: Insufficient documentation

## 2022-08-26 DIAGNOSIS — I872 Venous insufficiency (chronic) (peripheral): Secondary | ICD-10-CM | POA: Diagnosis not present

## 2022-08-26 DIAGNOSIS — I87311 Chronic venous hypertension (idiopathic) with ulcer of right lower extremity: Secondary | ICD-10-CM | POA: Diagnosis not present

## 2022-08-26 DIAGNOSIS — I739 Peripheral vascular disease, unspecified: Secondary | ICD-10-CM | POA: Diagnosis not present

## 2022-08-26 DIAGNOSIS — L97812 Non-pressure chronic ulcer of other part of right lower leg with fat layer exposed: Secondary | ICD-10-CM | POA: Insufficient documentation

## 2022-08-26 DIAGNOSIS — F0280 Dementia in other diseases classified elsewhere without behavioral disturbance: Secondary | ICD-10-CM | POA: Insufficient documentation

## 2022-08-26 DIAGNOSIS — I110 Hypertensive heart disease with heart failure: Secondary | ICD-10-CM | POA: Diagnosis not present

## 2022-08-26 DIAGNOSIS — I5042 Chronic combined systolic (congestive) and diastolic (congestive) heart failure: Secondary | ICD-10-CM | POA: Insufficient documentation

## 2022-08-26 DIAGNOSIS — X58XXXA Exposure to other specified factors, initial encounter: Secondary | ICD-10-CM | POA: Diagnosis not present

## 2022-08-26 DIAGNOSIS — S81801A Unspecified open wound, right lower leg, initial encounter: Secondary | ICD-10-CM | POA: Diagnosis not present

## 2022-08-26 NOTE — Progress Notes (Signed)
TAYTEN, HEBER ([id number]) Visit Report for 08/26/2022 Arrival Information Details Patient Name: Date of Service: [name], [name] 08/26/2022 10:45 A M Medical Record Number: [id number] Patient Account Number: [id number] Date of Birth/Sex: Treating RN: 1925/05/09 (88 y.o. [name], [name] Primary Care Jahmiyah Dullea: Marda Stalker Other Clinician: Referring Jersi Mcmaster: Treating Alik Mawson/Extender: Aviva Signs in Treatment: 85 Visit Information History Since Last Visit Added or deleted any medications: No Patient Arrived: Wheel Chair Any new allergies or adverse reactions: No Arrival Time: 10:39 Had a fall or experienced change in No Accompanied By: daughter activities of daily living that may affect Transfer Assistance: Manual risk of falls: Patient Identification Verified: Yes Signs or symptoms of abuse/neglect since last visito No Secondary Verification Process Completed: Yes Hospitalized since last visit: No Patient Requires Transmission-Based Precautions: No Implantable device outside of the clinic excluding No Patient Has Alerts: No cellular tissue based products placed in the center since last visit: Has Dressing in Place as Prescribed: Yes Pain Present Now: No Electronic Signature(s) Signed: 08/26/2022 3:46:02 PM By: [name] Entered By: Rhae Hammock on 08/26/2022 10:39:41 -------------------------------------------------------------------------------- Lower Extremity Assessment Details Patient Name: Date of Service: [name], [place] 08/26/2022 10:45 A M Medical Record Number: [id number] Patient Account Number: [id number] Date of Birth/Sex: Treating RN: 06/09/1925 (88 y.o. [name], [name] Primary Care  [place]: Marda Stalker Other Clinician: Referring Santez Woodcox: Treating Analese Sovine/Extender: Marjo Bicker Weeks in Treatment: 38 Edema Assessment Assessed: [Left: No] Patrice [place]: Yes] Edema:  [Left: No] [Right: No] Calf Left: Right: Point of Measurement: 30 cm From Medial Instep 34 cm 33.5 cm Ankle Left: Right: Point of Measurement: 9 cm From Medial Instep 21 cm 23.5 cm Vascular Assessment Pulses: Dorsalis Pedis Palpable: [Right:Yes] Posterior Tibial Palpable: [Right:Yes] Electronic Signature(s) Signed: 08/26/2022 3:46:02 PM By: [name] Entered By: Rhae Hammock on 08/26/2022 10:43:26 -------------------------------------------------------------------------------- Multi Wound Chart Details Patient Name: Date of Service: JENIN, BIRDSALL RNESTINE 08/26/2022 10:45 A M Medical Record Number: [id number] Patient Account Number: [id number] Date of Birth/Sex: Treating RN: 12-29-1924 (88 y.o. [name], [name] Primary Care Beatrice Ziehm: Marda Stalker Other Clinician: Referring Dawnyel Leven: Treating Vittoria Noreen/Extender: Aviva Signs in Treatment: 36 Vital Signs Height(in): 66 Pulse(bpm): 70 Weight(lbs): 176 Blood Pressure(mmHg): 140/79 Body Mass Index(BMI): 28.4 Temperature(F): 98 Respiratory Rate(breaths/min): 17 Photos: [26:No Photos] [N/A:N/A] Left, Lateral Ankle Right, Anterior Lower Leg N/A Wound Location: Gradually Appeared Gradually Appeared N/A Wounding Event: Venous Leg Ulcer Venous Leg Ulcer N/A Primary Etiology: N/A Anemia, Congestive Heart Failure, N/A Comorbid History: Hypertension, Peripheral Venous Disease, Osteoarthritis, Received Chemotherapy 11/20/2021 08/23/2022 N/A Date Acquired: 38 0 N/A Weeks of Treatment: Healed - Epithelialized Open N/A Wound Status: No No N/A Wound Recurrence: 0x0x0 2x1x0.2 N/A Measurements L x W x D (cm) 0 1.571 N/A A (cm) : rea 0 0.314 N/A Volume (cm) : 100.00% N/A N/A % Reduction in Area: 100.00% N/A N/A % Reduction in Volume: Full Thickness Without Exposed Full Thickness With Exposed Support N/A Classification: Support Structures Structures None Present Medium  N/A Exudate A mount: N/A Serosanguineous N/A Exudate Type: N/A red, brown N/A Exudate Color: N/A Distinct, outline attached N/A Wound Margin: N/A Medium (34-66%) N/A Granulation A mount: N/A Red, Pink N/A Granulation Quality: N/A Medium (34-66%) N/A Necrotic A mount: N/A Small (1-33%) N/A Epithelialization: N/A Debridement - Excisional N/A Debridement: Pre-procedure Verification/Time Out N/A 11:11 N/A Taken: N/A Lidocaine N/A Pain Control: N/A Subcutaneous, Slough N/A Tissue Debrided: N/A Skin/Subcutaneous Tissue N/A Level: N/A 2 N/A Debridement A (sq cm): rea N/A Curette N/A  Instrument: N/A Minimum N/A Bleeding: N/A Pressure N/A Hemostasis A chieved: N/A 0 N/A Procedural Pain: N/A 0 N/A Post Procedural Pain: N/A Procedure was tolerated well N/A Debridement Treatment Response: N/A 2x1x0.2 N/A Post Debridement Measurements L x W x D (cm) N/A 0.314 N/A Post Debridement Volume: (cm) No Abnormalities Noted Excoriation: No N/A Periwound Skin Texture: Induration: No Callus: No Crepitus: No Rash: No Scarring: No No Abnormalities Noted Maceration: No N/A Periwound Skin Moisture: Dry/Scaly: No No Abnormalities Noted Atrophie Blanche: No N/A Periwound Skin Color: Cyanosis: No Ecchymosis: No Erythema: No Hemosiderin Staining: No Mottled: No Pallor: No Rubor: No N/A No Abnormality N/A Temperature: N/A Yes N/A Tenderness on Palpation: N/A Debridement N/A Procedures Performed: Treatment Notes Electronic Signature(s) Signed: 08/26/2022 3:46:02 PM By: [name] Signed: 08/26/2022 4:20:55 PM By: Kalman Shan DO Entered By: Kalman Shan on 08/26/2022 11:15:28 -------------------------------------------------------------------------------- Multi-Disciplinary Care Plan Details Patient Name: Date of Service: [place], [place] 08/26/2022 10:45 A M Medical Record Number: [id number] Patient Account Number: [id number] Date of  Birth/Sex: Treating RN: 1925-07-02 (88 y.o. [name], [name] Primary Care Torrance Frech: Marda Stalker Other Clinician: Referring Ilze Roselli: Treating Dela Sweeny/Extender: Aviva Signs in Treatment: 46 Active Inactive Wound/Skin Impairment Nursing Diagnoses: Impaired tissue integrity Goals: Patient/caregiver will verbalize understanding of skin care regimen Date Initiated: 11/27/2021 Target Resolution Date: 09/18/2022 Goal Status: Active Ulcer/skin breakdown will have a volume reduction of 30% by week 4 Date Initiated: 11/27/2021 Date Inactivated: 12/31/2021 Target Resolution Date: 12/25/2021 Unmet Reason: according to Goal Status: Unmet measurements has not reduced. Interventions: Assess patient/caregiver ability to obtain necessary supplies Assess patient/caregiver ability to perform ulcer/skin care regimen upon admission and as needed Assess ulceration(s) every visit Provide education on ulcer and skin care Treatment Activities: Topical wound management initiated : 11/27/2021 Notes: Electronic Signature(s) Signed: 08/26/2022 3:46:02 PM By: [name] Entered By: Rhae Hammock on 08/26/2022 11:10:14 -------------------------------------------------------------------------------- Pain Assessment Details Patient Name: Date of Service: WRENN, WILLCOX RNESTINE 08/26/2022 10:45 A M Medical Record Number: [id number] Patient Account Number: [id number] Date of Birth/Sex: Treating RN: 12/10/1924 (88 y.o. [name], [name] Primary Care Kaleem Sartwell: Marda Stalker Other Clinician: Referring Dellas Guard: Treating Tegh Franek/Extender: Aviva Signs in Treatment: 24 Active Problems Location of Pain Severity and Description of Pain Patient Has Paino No Site Locations Pain Management and Medication Current Pain Management: Electronic Signature(s) Signed: 08/26/2022 3:46:02 PM By: [name] Entered By: Rhae Hammock on 08/26/2022 10:40:19 -------------------------------------------------------------------------------- Patient/Caregiver Education Details Patient Name: Date of Service: Thalia Party RNESTINE 10/5/2023andnbsp10:45 A M Medical Record Number: [id number] Patient Account Number: [id number] Date of Birth/Gender: Treating RN: May 25, 1925 (88 y.o. [name], [name] Primary Care Physician: Marda Stalker Other Clinician: Referring Physician: Treating Physician/Extender: Aviva Signs in Treatment: 42 Education Assessment Education Provided To: Patient and Caregiver Education Topics Provided Wound/Skin Impairment: Methods: Explain/Verbal Responses: Reinforcements needed, State content correctly Electronic Signature(s) Signed: 08/26/2022 3:46:02 PM By: [name] Entered By: Rhae Hammock on 08/26/2022 11:10:58 -------------------------------------------------------------------------------- Wound Assessment Details Patient Name: Date of Service: TARIKA, MCKETHAN RNESTINE 08/26/2022 10:45 A M Medical Record Number: [id number] Patient Account Number: [id number] Date of Birth/Sex: Treating RN: [birth year] (88 y.o. [name], [name] Primary Care Sybrina Laning: Marda Stalker Other Clinician: Referring Briona Korpela: Treating Amarie Viles/Extender: Marjo Bicker Weeks in Treatment: 52 Wound Status Wound Number: 26 Primary Etiology: Venous Leg Ulcer Wound Location: Left, Lateral Ankle Wound Status: Healed - Epithelialized Wounding Event: Gradually Appeared Date Acquired: 11/20/2021 Weeks Of Treatment: 38 Clustered Wound: No Wound Measurements Length: (  cm) Width: (cm) Depth: (cm) Area: (cm) Volume: (cm) 0 % Reduction in Area: 100% 0 % Reduction in Volume: 100% 0 0 0 Wound Description Classification: Full Thickness Without Exposed Support Structu Exudate Amount: None Present res Periwound Skin Texture Texture  Color No Abnormalities Noted: No No Abnormalities Noted: No Moisture No Abnormalities Noted: No Electronic Signature(s) Signed: 08/26/2022 3:46:02 PM By: [name] Entered By: Rhae Hammock on 08/26/2022 10:43:39 -------------------------------------------------------------------------------- Wound Assessment Details Patient Name: Date of Service: MILANA, SALAY RNESTINE 08/26/2022 10:45 A M Medical Record Number: [id number] Patient Account Number: [id number] Date of Birth/Sex: Treating RN: January 05, 1925 (88 y.o. [name], [name] Primary Care Sarit Sparano: Marda Stalker Other Clinician: Referring Carrina Schoenberger: Treating Ariana Cavenaugh/Extender: Marjo Bicker Weeks in Treatment: 38 Wound Status Wound Number: 27 Primary Venous Leg Ulcer Etiology: Wound Location: Right, Anterior Lower Leg Wound Open Wounding Event: Gradually Appeared Status: Date Acquired: 08/23/2022 Comorbid Anemia, Congestive Heart Failure, Hypertension, Peripheral Weeks Of Treatment: 0 History: Venous Disease, Osteoarthritis, Received Chemotherapy Clustered Wound: No Photos Wound Measurements Length: (cm) 2 Width: (cm) 1 Depth: (cm) 0.2 Area: (cm) 1.571 Volume: (cm) 0.314 % Reduction in Area: % Reduction in Volume: Epithelialization: Small (1-33%) Tunneling: No Undermining: No Wound Description Classification: Full Thickness With Exposed Support Structures Wound Margin: Distinct, outline attached Exudate Amount: Medium Exudate Type: Serosanguineous Exudate Color: red, brown Foul Odor After Cleansing: No Slough/Fibrino Yes Wound Bed Granulation Amount: Medium (34-66%) Exposed Structure Granulation Quality: Red, Pink Fascia Exposed: No Necrotic Amount: Medium (34-66%) Fat Layer (Subcutaneous Tissue) Exposed: Yes Necrotic Quality: Adherent Slough Tendon Exposed: No Muscle Exposed: No Joint Exposed: No Bone Exposed: No Periwound Skin Texture Texture Color No  Abnormalities Noted: No No Abnormalities Noted: No Callus: No Atrophie Blanche: No Crepitus: No Cyanosis: No Excoriation: No Ecchymosis: No Induration: No Erythema: No Rash: No Hemosiderin Staining: No Scarring: No Mottled: No Pallor: No Moisture Rubor: No No Abnormalities Noted: No Dry / Scaly: No Temperature / Pain Maceration: No Temperature: No Abnormality Tenderness on Palpation: Yes Electronic Signature(s) Signed: 08/26/2022 3:46:02 PM By: [name] Entered By: Rhae Hammock on 08/26/2022 10:45:38 -------------------------------------------------------------------------------- Vitals Details Patient Name: Date of Service: ALIX, LAHMANN RNESTINE 08/26/2022 10:45 A M Medical Record Number: [id number] Patient Account Number: [id number] Date of Birth/Sex: Treating RN: 1925-09-17 (88 y.o. [name], [name] Primary Care Adlai Sinning: Marda Stalker Other Clinician: Referring Laberta Wilbon: Treating Jesselyn Rask/Extender: Aviva Signs in Treatment: 43 Vital Signs Time Taken: 10:40 Temperature (F): 98 Height (in): 66 Pulse (bpm): 70 Weight (lbs): 176 Respiratory Rate (breaths/min): 17 Body Mass Index (BMI): 28.4 Blood Pressure (mmHg): 140/79 Reference Range: 80 - 120 mg / dl Electronic Signature(s) Signed: 08/26/2022 3:46:02 PM By: [name] Entered By: Rhae Hammock on 08/26/2022 10:40:08

## 2022-08-26 NOTE — Progress Notes (Signed)
[name] ([id number]) Visit Report for 08/26/2022 Chief Complaint Document Details Patient Name: Date of Service: [name], [name] 08/26/2022 10:45 A M Medical Record Number: [id number] Patient Account Number: [id number] Date of Birth/Sex: Treating RN: 14-Jan-1925 (88 y.o. [name], [name]: Marda Stalker Other Clinician: Referring Provider: Treating Provider/Extender: Marjo Bicker Weeks in Treatment: 62 Information Obtained from: Patient Chief Complaint 11/27/2021; Left lower extremity wound 08/26/2022; right lower extremity wound Electronic Signature(s) Signed: 08/26/2022 4:20:55 PM By: Kalman Shan DO Entered By: Kalman Shan on 08/26/2022 11:16:07 -------------------------------------------------------------------------------- Debridement Details Patient Name: Date of Service: [name], [name] 08/26/2022 10:45 A M Medical Record Number: [id number] Patient Account Number: [id number] Date of Birth/Sex: Treating RN: Jan 31, 1925 (88 y.o. [name], [name]: Marda Stalker Other Clinician: Referring Provider: Treating Provider/Extender: Aviva Signs in Treatment: 38 Debridement Performed for Assessment: Wound #27 Right,Anterior Lower Leg Performed By: Physician Kalman Shan, DO Debridement Type: Debridement Severity of Tissue Pre Debridement: Fat layer exposed Level of Consciousness (Pre-procedure): Awake and Alert Pre-procedure Verification/Time Out Yes - 11:11 Taken: Start Time: 11:11 Pain Control: Lidocaine T Area Debrided (L x W): otal 2 (cm) x 1 (cm) = 2 (cm) Tissue and other material debrided: Viable, Non-Viable, Slough, Subcutaneous, Slough Level: Skin/Subcutaneous Tissue Debridement Description: Excisional Instrument: Curette Bleeding: Minimum Hemostasis Achieved: Pressure End Time: 11:11 Procedural Pain: 0 Post Procedural Pain: 0 Response  to Treatment: Procedure was tolerated well Level of Consciousness (Post- Awake and Alert procedure): Post Debridement Measurements of Total Wound Length: (cm) 2 Width: (cm) 1 Depth: (cm) 0.2 Volume: (cm) 0.314 Character of Wound/Ulcer Post Debridement: Improved Severity of Tissue Post Debridement: Fat layer exposed Post Procedure Diagnosis Same as Pre-procedure Electronic Signature(s) Signed: 08/26/2022 3:46:02 PM By: [name] Signed: 08/26/2022 4:20:55 PM By: Kalman Shan DO Entered By: Rhae Hammock on 08/26/2022 11:12:20 -------------------------------------------------------------------------------- HPI Details Patient Name: Date of Service: [name], [name] RNESTINE 08/26/2022 10:45 A M Medical Record Number: [id number] Patient Account Number: [id number] Date of Birth/Sex: Treating RN: 06-18-1925 (88 y.o. [name], [name]: Marda Stalker Other Clinician: Referring Provider: Treating Provider/Extender: Aviva Signs in Treatment: 54 History of Present Illness HPI Description: this is a patient we know from several prior wounds on her bilateral lower extremities. She has venous stasis physiology, inflammation and hypertension. She is been fully evaluated for venous ablation and was found not to be a candidate. She does not have significant arterial disease. 02/14/15 the wound itself on her lateral left leg did not look too bad however there was surrounding maceration which was concerning 02/27/15 the wound itself is small with surrounding circumferential epithelialization there is no surrounding maceration. 03/06/15 only a very small area remains. I think this is mostly epithelialized however I would be uncomfortable not dressing this this week 03/13/15 the area is epithelialized. There was a thick surface on this. I took a #15 blade then lightly disrupted it but there does not appear to be any open area I did not see  anything but appropriate epithelium Readmission: 08/16/18 on evaluation today patient presents for initial evaluation and our clinic released readmission although she has not been seen since April 2016. Nonetheless she is having issues with the ulcers currently on the bilateral lateral malleolus or locations as well as the left lateral lower extremity. These have been present for roughly 3 weeks since the patient actually developed significant bilateral lower extremity edema which patient's daughter states was roughly 3 times the  size of her legs currently. Subsequently she ended up in the emerging department due to congestive heart failure. They were able to get the swelling under control and fortunately she seems to be doing much better at this point in time. She was placed in an [employer] which she sat on for week part evaluation today as well. Fortunately the wound areas actually seem to be doing fairly well there is some macerated skin surrounding there are the areas/surface of the wound which are gonna require some debridement at this point. No fevers, chills, nausea, or vomiting noted at this time. Patient has a history of hypertension, mild peripheral vascular disease, chronic venous stasis, and other than the ulcer seems to be doing fairly well today. No fevers, chills, nausea, or vomiting noted at this time. 08/23/18 on evaluation today patient actually appears to be doing great in regard to her bilateral lower should be ulcers. She's shown signs of improvement even just with one week with the [employer]. In general I'm actually very pleased with how things appear at this point. 09/06/18 on evaluation today patient actually appears to be doing much better her left lower extremity is completely healed. Go to the right lower extremity lateral malleolus ulcer this still seems to be showing signs of being open. There does not appear to be evidence of infection which is good news. This  did require some sharp debridement however to remove some of the necrotic tissue/eschar on the surface of the wound Readmission 11/27/2021 [name] is a 88 year old female with a past medical history of endometrial and breast cancer, chronic venous insufficiency, dementia and essential hypertension that presents to the clinic with a 1 month history of nonhealing ulcer to the left lower extremity. She states that the ulcer opened 1 month ago and then healed but has reopened again in the past week. She is currently keeping the area covered. She has a history of wounds to her lower extremities. She has compression stockings that she uses daily. She currently denies signs of infection. 1/13; patient presents for follow-up. She followed up for nurse visit for wrap change. She reports more odor. She denies pain. 1/20; patient presents for follow-up. She has been using gentamicin ointment with calcium alginate to the wound bed. She reports improvement in odor and drainage. She currently denies systemic signs of infection. 1/27; patient presents for follow-up. She has been using gentamicin ointment with calcium alginate. She received her Keystone antibiotic 2 days ago. She brought this in. Home health has also been established. They will come out for the first time next week. She currently denies signs of infection. She has no issues or complaints today. 2/2; patient presents for follow-up. She has been using Keystone antibiotics with [facility] with no issues. She denies signs of infection. She has no issues or complaints today. 2/9; patient presents for follow-up. She has been using Keystone antibiotics with [facility] with no issues. She has tolerated the compression wrap well. She denies signs of infection. 2/16; patient presents for follow-up. She has been using Keystone antibiotics with Hydrofera Blue under compression wrap with no issues. She currently denies signs of  infection. 2/23; patient presents for follow-up. She continues to use Keystone antibiotics with Hydrofera Blue under the compression wrap. She has no issues or complaints today. She denies signs of infection. Home health continues to come out and change the wrap. 3/2; patient presents for follow-up. She continues to use [place] with [facility] under  compression with no issues. She denies signs of infection. 3/16; patient presents for follow-up. She continues to use [facility] with [facility] under compression with no issues. Home health change the dressing last week. She denies signs of infection. 3/23; patient presents for follow-up. She continues to use Keystone antibiotics with Hydrofera Blue under compression with no issues. Home health continues to change the dressings. She denies signs of infection. 3/30; patient presents for follow-up. She continues to use Keystone antibiotics with Hydrofera Blue under compression with no issues. Home health continues to change the dressings. 4/13 2-week follow-up. Keystone and Hydrofera Blue wound is roughly 2.3 cm in diameter smaller. No other issues 4/20; patient presents for follow-up. She continues to use Keystone antibiotic with [facility] with no issues. 4/28; patient with a difficult wound on her right medial lower leg. Using [facility] Blue minimal improvement per our intake nurse we are using compression. She has home health changing the dressing 5/5; patient presents for follow-up. We have been using Keystone antibiotics and Hydrofera Blue under compression therapy. She has no issues or complaints today. We discussed potentially using an advanced tissue product. She states she has had this done in the past and would like to proceed to see what her insurance would cover. 5/15; patient presents for follow-up. She has been using Keystone antibiotics and Hydrofera Blue under compression therapy. She forgot her  Keystone antibiotics today. Insurance has not approved her for skin substitute. She currently denies signs of infection. She has home health that changes the dressing once a week. 5/19; patient presents for follow-up. We have been using Keystone antibiotic and Hydrofera Blue under compression therapy. She has no issues or complaints today. The [employer] rep confirmed over the phone that the patient has been 100% approved for their skin substitute. We will verify this as we have not received any paperwork stating this. Patient states she would like to proceed with a skin substitute if it is covered by insurance. 6/1; patient presents for follow-up. We have been using Keystone antibiotic and Hydrofera Blue under compression therapy. We confirmed that she does have a co-pay for the [employer] skin substitute. We will hold off on using this for now since she continues to show improvement with current therapy. She denies signs of infection. She has home health that comes out twice weekly. 6/16; patient presents for follow-up. We have been using Keystone antibiotic and Hydrofera Blue under compression therapy. She has no issues or complaints today. We discussed the potential trial of vendaje. This is placenta tissue. She states she would like to try the free trial. We will reach out to our rep. 6/30; patient presents for follow-up. She has been using Keystone antibiotic and Hydrofera Blue under compression therapy. She has been approved for a trial of vendaje. She would like to proceed with this and We will have this available at next clinic visit. She has no issues or complaints today. 7/18; patient presents for follow-up. She has been using Keystone antibiotic and Hydrofera Blue under compression. She has no issues or complaints today. We have a free sample of vendaje skin substitute and patient is agreeable to having this placed today. 7/24; patient presents for follow-up. We placed the first free trial of  vendaje last week under compression wrap. Patient had no issues or complaints today. She tolerated this well. 8/1; patient presents for follow-up. Vandaje #2 donated product was placed in standard fashion last week under compression therapy. She has no issues or complaints  today. 8/8; patient presents for follow-up. Vendaje #3 donated skin set was placed in standard fashion last visit under compression therapy. She has no issues or complaints today. 8/15; Patient presents for follow-up. Vandaje #4 was placed in standard fashion last week under compression therapy. She has no issues or complaints today. 8/22; patient presents for follow-up. Vandaje #5 was placed in standard fashion last week under compression therapy. She tolerated this well. 8/29; patient presents for follow-up. We have been using Xeroform under compression therapy and she has tolerated this well. She has no issues or complaints today. She has home health that is been coming out weekly For dressing changes. 9/12; patient presents for follow-up. We have been using Xeroform under compression therapy. Home health has been changing the dressings. She has no issues or complaints today. 9/19; patient presents for follow-up. We have been using Xeroform under compression therapy. She has no issues or complaints today. 9/26; patient presents for follow-up. We have been using Xeroform under compression therapy. She has no issues or complaints today. Admission 08/26/2022 Ms. Viann Nielson is a 88 year old female that was recently discharged from our clinic last week due to a closed wound to the left lower extremity. Unfortunately she has developed a new wound to the right lower extremity. It appears that this is from trauma. Electronic Signature(s) Signed: 08/26/2022 4:20:55 PM By: Kalman Shan DO Entered By: Kalman Shan on 08/26/2022  11:16:43 -------------------------------------------------------------------------------- Physical Exam Details Patient Name: Date of Service: [name], [name] RNESTINE 08/26/2022 10:45 A M Medical Record Number: [id number] Patient Account Number: [id number] Date of Birth/Sex: Treating RN: 25-Jun-1925 (88 y.o. [name], [name]: Marda Stalker Other Clinician: Referring Provider: Treating Provider/Extender: Marjo Bicker Weeks in Treatment: 65 Constitutional respirations regular, non-labored and within target range for patient.. Cardiovascular 2+ dorsalis pedis/posterior tibialis pulses. Psychiatric pleasant and cooperative. Notes Right lower extremity: T the anterior aspect there is an open wound with granulation tissue and nonviable tissue. o Electronic Signature(s) Signed: 08/26/2022 4:20:55 PM By: Kalman Shan DO Entered By: Kalman Shan on 08/26/2022 11:18:44 -------------------------------------------------------------------------------- Physician Orders Details Patient Name: Date of Service: EVELIA, WASKEY RNESTINE 08/26/2022 10:45 A M Medical Record Number: [id number] Patient Account Number: [id number] Date of Birth/Sex: Treating RN: 07-28-1925 (88 y.o. [name], [name]: Marda Stalker Other Clinician: Referring Provider: Treating Provider/Extender: Aviva Signs in Treatment: 63 Verbal / Phone Orders: No Diagnosis Coding ICD-10 Coding Code Description S81.801A Unspecified open wound, right lower leg, initial encounter I87.311 Chronic venous hypertension (idiopathic) with ulcer of right lower extremity C50.919 Malignant neoplasm of unspecified site of unspecified female breast I50.42 Chronic combined systolic (congestive) and diastolic (congestive) heart failure G30.9 Alzheimer's disease, unspecified C54.1 Malignant neoplasm of endometrium Follow-up  Appointments ppointment in 1 week. - w/ Dr. Heber [place] and Elias Else # 9 Return A Anesthetic (In clinic) Topical Lidocaine 5% applied to wound bed Bathing/ Shower/ Hygiene May shower with protection but do not get wound dressing(s) wet. - use cast protector. May shower and wash wound with soap and water. - with dressing changes may wash with soap and water. Edema Control - Lymphedema / SCD / Other Elevate legs to the level of the heart or above for 30 minutes daily and/or when sitting, a frequency of: Avoid standing for long periods of time. Patient to wear own compression stockings every day. - apply in the morning and remove at night to right leg. Exercise regularly Moisturize legs daily. - every night before day right  leg. Additional Orders / Instructions Follow Nutritious Diet - High Protein Diet Wound Treatment Wound #27 - Lower Leg Wound Laterality: Right, Anterior Cleanser: Soap and Water 1 x Per Week/7 Days Discharge Instructions: May shower and wash wound with dial antibacterial soap and water prior to dressing change. Prim Dressing: Hydrofera Blue Ready Foam, 2.5 x2.5 in 1 x Per Week/7 Days ary Discharge Instructions: Apply to wound bed as instructed Secondary Dressing: Woven Gauze Sponge, Non-Sterile 4x4 in 1 x Per Week/7 Days Discharge Instructions: Apply over primary dressing as directed. Compression Wrap: ThreePress (3 layer compression wrap) 1 x Per Week/7 Days Discharge Instructions: Apply three layer compression as directed. Patient Medications llergies: No Known Drug Allergies A Notifications Medication Indication Start End PRN debridements/pain10/03/2022 lidocaine DOSE topical 5 % gel - gel topical Electronic Signature(s) Signed: 08/26/2022 4:20:55 PM By: Kalman Shan DO Entered By: Kalman Shan on 08/26/2022 11:18:53 -------------------------------------------------------------------------------- Problem List Details Patient Name: Date of  Service: [name], [name] RNESTINE 08/26/2022 10:45 A M Medical Record Number: [id number] Patient Account Number: [id number] Date of Birth/Sex: Treating RN: Apr 14, 1925 (88 y.o. [name], [name]: Marda Stalker Other Clinician: Referring Provider: Treating Provider/Extender: Aviva Signs in Treatment: 29 Active Problems ICD-10 Encounter Code Description Active Date MDM Diagnosis S81.801A Unspecified open wound, right lower leg, initial encounter 08/26/2022 No Yes I87.311 Chronic venous hypertension (idiopathic) with ulcer of right lower extremity 08/26/2022 No Yes C50.919 Malignant neoplasm of unspecified site of unspecified female breast 11/27/2021 No Yes I50.42 Chronic combined systolic (congestive) and diastolic (congestive) heart failure 11/27/2021 No Yes G30.9 Alzheimer's disease, unspecified 11/27/2021 No Yes C54.1 Malignant neoplasm of endometrium 11/27/2021 No Yes Inactive Problems Resolved Problems ICD-10 Code Description Active Date Resolved Date I87.312 Chronic venous hypertension (idiopathic) with ulcer of left lower extremity 11/27/2021 11/27/2021 O35.009 Non-pressure chronic ulcer of other part of left lower leg with fat layer exposed 11/27/2021 11/27/2021 Electronic Signature(s) Signed: 08/26/2022 4:20:55 PM By: Kalman Shan DO Entered By: Kalman Shan on 08/26/2022 11:15:25 -------------------------------------------------------------------------------- Progress Note Details Patient Name: Date of Service: [name], [name] RNESTINE 08/26/2022 10:45 A M Medical Record Number: [id number] Patient Account Number: [id number] Date of Birth/Sex: Treating RN: 1925-05-09 (88 y.o. [name], [name]: Marda Stalker Other Clinician: Referring Provider: Treating Provider/Extender: Aviva Signs in Treatment: 11 Subjective Chief Complaint Information obtained from Patient 11/27/2021; Left  lower extremity wound 08/26/2022; right lower extremity wound History of Present Illness (HPI) this is a patient we know from several prior wounds on her bilateral lower extremities. She has venous stasis physiology, inflammation and hypertension. She is been fully evaluated for venous ablation and was found not to be a candidate. She does not have significant arterial disease. 02/14/15 the wound itself on her lateral left leg did not look too bad however there was surrounding maceration which was concerning 02/27/15 the wound itself is small with surrounding circumferential epithelialization there is no surrounding maceration. 03/06/15 only a very small area remains. I think this is mostly epithelialized however I would be uncomfortable not dressing this this week 03/13/15 the area is epithelialized. There was a thick surface on this. I took a #15 blade then lightly disrupted it but there does not appear to be any open area I did not see anything but appropriate epithelium Readmission: 08/16/18 on evaluation today patient presents for initial evaluation and our clinic released readmission although she has not been seen since April 2016. Nonetheless she is having issues with the ulcers currently on  the bilateral lateral malleolus or locations as well as the left lateral lower extremity. These have been present for roughly 3 weeks since the patient actually developed significant bilateral lower extremity edema which patient's daughter states was roughly 3 times the size of her legs currently. Subsequently she ended up in the emerging department due to congestive heart failure. They were able to get the swelling under control and fortunately she seems to be doing much better at this point in time. She was placed in an [employer] which she sat on for week part evaluation today as well. Fortunately the wound areas actually seem to be doing fairly well there is some macerated skin surrounding there are  the areas/surface of the wound which are gonna require some debridement at this point. No fevers, chills, nausea, or vomiting noted at this time. Patient has a history of hypertension, mild peripheral vascular disease, chronic venous stasis, and other than the ulcer seems to be doing fairly well today. No fevers, chills, nausea, or vomiting noted at this time. 08/23/18 on evaluation today patient actually appears to be doing great in regard to her bilateral lower should be ulcers. She's shown signs of improvement even just with one week with the [employer]. In general I'm actually very pleased with how things appear at this point. 09/06/18 on evaluation today patient actually appears to be doing much better her left lower extremity is completely healed. Go to the right lower extremity lateral malleolus ulcer this still seems to be showing signs of being open. There does not appear to be evidence of infection which is good news. This did require some sharp debridement however to remove some of the necrotic tissue/eschar on the surface of the wound Readmission 11/27/2021 [name] is a 88 year old female with a past medical history of endometrial and breast cancer, chronic venous insufficiency, dementia and essential hypertension that presents to the clinic with a 1 month history of nonhealing ulcer to the left lower extremity. She states that the ulcer opened 1 month ago and then healed but has reopened again in the past week. She is currently keeping the area covered. She has a history of wounds to her lower extremities. She has compression stockings that she uses daily. She currently denies signs of infection. 1/13; patient presents for follow-up. She followed up for nurse visit for wrap change. She reports more odor. She denies pain. 1/20; patient presents for follow-up. She has been using gentamicin ointment with calcium alginate to the wound bed. She reports improvement in odor  and drainage. She currently denies systemic signs of infection. 1/27; patient presents for follow-up. She has been using gentamicin ointment with calcium alginate. She received her Keystone antibiotic 2 days ago. She brought this in. Home health has also been established. They will come out for the first time next week. She currently denies signs of infection. She has no issues or complaints today. 2/2; patient presents for follow-up. She has been using Keystone antibiotics with [facility] with no issues. She denies signs of infection. She has no issues or complaints today. 2/9; patient presents for follow-up. She has been using Keystone antibiotics with [facility] with no issues. She has tolerated the compression wrap well. She denies signs of infection. 2/16; patient presents for follow-up. She has been using Keystone antibiotics with Hydrofera Blue under compression wrap with no issues. She currently denies signs of infection. 2/23; patient presents for follow-up. She continues to use Keystone antibiotics with [facility]  Blue under the compression wrap. She has no issues or complaints today. She denies signs of infection. Home health continues to come out and change the wrap. 3/2; patient presents for follow-up. She continues to use [facility] with [facility] under compression with no issues. She denies signs of infection. 3/16; patient presents for follow-up. She continues to use [facility] with [facility] under compression with no issues. Home health change the dressing last week. She denies signs of infection. 3/23; patient presents for follow-up. She continues to use Keystone antibiotics with Hydrofera Blue under compression with no issues. Home health continues to change the dressings. She denies signs of infection. 3/30; patient presents for follow-up. She continues to use Keystone antibiotics with Hydrofera Blue under compression with no issues. Home health continues to change  the dressings. 4/13 2-week follow-up. Keystone and Hydrofera Blue wound is roughly 2.3 cm in diameter smaller. No other issues 4/20; patient presents for follow-up. She continues to use Keystone antibiotic with [facility] with no issues. 4/28; patient with a difficult wound on her right medial lower leg. Using [facility] Blue minimal improvement per our intake nurse we are using compression. She has home health changing the dressing 5/5; patient presents for follow-up. We have been using Keystone antibiotics and Hydrofera Blue under compression therapy. She has no issues or complaints today. We discussed potentially using an advanced tissue product. She states she has had this done in the past and would like to proceed to see what her insurance would cover. 5/15; patient presents for follow-up. She has been using Keystone antibiotics and Hydrofera Blue under compression therapy. She forgot her Keystone antibiotics today. Insurance has not approved her for skin substitute. She currently denies signs of infection. She has home health that changes the dressing once a week. 5/19; patient presents for follow-up. We have been using Keystone antibiotic and Hydrofera Blue under compression therapy. She has no issues or complaints today. The [employer] rep confirmed over the phone that the patient has been 100% approved for their skin substitute. We will verify this as we have not received any paperwork stating this. Patient states she would like to proceed with a skin substitute if it is covered by insurance. 6/1; patient presents for follow-up. We have been using Keystone antibiotic and Hydrofera Blue under compression therapy. We confirmed that she does have a co-pay for the [employer] skin substitute. We will hold off on using this for now since she continues to show improvement with current therapy. She denies signs of infection. She has home health that comes out twice weekly. 6/16; patient  presents for follow-up. We have been using Keystone antibiotic and Hydrofera Blue under compression therapy. She has no issues or complaints today. We discussed the potential trial of vendaje. This is placenta tissue. She states she would like to try the free trial. We will reach out to our rep. 6/30; patient presents for follow-up. She has been using Keystone antibiotic and Hydrofera Blue under compression therapy. She has been approved for a trial of vendaje. She would like to proceed with this and We will have this available at next clinic visit. She has no issues or complaints today. 7/18; patient presents for follow-up. She has been using Keystone antibiotic and Hydrofera Blue under compression. She has no issues or complaints today. We have a free sample of vendaje skin substitute and patient is agreeable to having this placed today. 7/24; patient presents for follow-up. We placed the first free trial of vendaje  last week under compression wrap. Patient had no issues or complaints today. She tolerated this well. 8/1; patient presents for follow-up. Vandaje #2 donated product was placed in standard fashion last week under compression therapy. She has no issues or complaints today. 8/8; patient presents for follow-up. Vendaje #3 donated skin set was placed in standard fashion last visit under compression therapy. She has no issues or complaints today. 8/15; Patient presents for follow-up. Vandaje #4 was placed in standard fashion last week under compression therapy. She has no issues or complaints today. 8/22; patient presents for follow-up. Vandaje #5 was placed in standard fashion last week under compression therapy. She tolerated this well. 8/29; patient presents for follow-up. We have been using Xeroform under compression therapy and she has tolerated this well. She has no issues or complaints today. She has home health that is been coming out weekly For dressing changes. 9/12; patient  presents for follow-up. We have been using Xeroform under compression therapy. Home health has been changing the dressings. She has no issues or complaints today. 9/19; patient presents for follow-up. We have been using Xeroform under compression therapy. She has no issues or complaints today. 9/26; patient presents for follow-up. We have been using Xeroform under compression therapy. She has no issues or complaints today. Admission 08/26/2022 [name] is a 88 year old female that was recently discharged from our clinic last week due to a closed wound to the left lower extremity. Unfortunately she has developed a new wound to the right lower extremity. It appears that this is from trauma. Patient History Information obtained from Patient. Family History Cancer - Child, Hypertension - Child,Father, Kidney Disease - Child, Stroke - Siblings,Child, Thyroid Problems - Child, No family history of Diabetes, Heart Disease, Hereditary Spherocytosis, Lung Disease, Seizures, Tuberculosis. Social History Never smoker, Marital Status - Widowed, Alcohol Use - Never, Drug Use - No History, Caffeine Use - Never. Medical History Eyes Denies history of Cataracts, Glaucoma, Optic Neuritis Ear/Nose/Mouth/Throat Denies history of Chronic sinus problems/congestion, Middle ear problems Hematologic/Lymphatic Patient has history of Anemia Denies history of Hemophilia, Human Immunodeficiency Virus, Lymphedema, Sickle Cell Disease Respiratory Denies history of Aspiration, Asthma, Chronic Obstructive Pulmonary Disease (COPD), Pneumothorax, Sleep Apnea, Tuberculosis Cardiovascular Patient has history of Congestive Heart Failure, Hypertension, Peripheral Venous Disease Denies history of Angina, Arrhythmia, Coronary Artery Disease, Hypotension, Myocardial Infarction, Peripheral Arterial Disease, Phlebitis, Vasculitis Gastrointestinal Denies history of Cirrhosis , Colitis, Crohnoos, Hepatitis A, Hepatitis  B, Hepatitis C Endocrine Denies history of Type I Diabetes, Type II Diabetes Genitourinary Denies history of End Stage Renal Disease Immunological Denies history of Lupus Erythematosus, Raynaudoos, Scleroderma Integumentary (Skin) Denies history of History of Burn Musculoskeletal Patient has history of Osteoarthritis Denies history of Gout, Rheumatoid Arthritis, Osteomyelitis Neurologic Denies history of Neuropathy, Quadriplegia, Paraplegia, Seizure Disorder Oncologic Patient has history of Received Chemotherapy - Breast and Ovarian Cancer Denies history of Received Radiation Hospitalization/Surgery History - diverticulits. Objective Constitutional respirations regular, non-labored and within target range for patient.. Vitals Time Taken: 10:40 AM, Height: 66 in, Weight: 176 lbs, BMI: 28.4, Temperature: 98 F, Pulse: 70 bpm, Respiratory Rate: 17 breaths/min, Blood Pressure: 140/79 mmHg. Cardiovascular 2+ dorsalis pedis/posterior tibialis pulses. Psychiatric pleasant and cooperative. General Notes: Right lower extremity: T the anterior aspect there is an open wound with granulation tissue and nonviable tissue. o Integumentary (Hair, Skin) Wound #26 status is Healed - Epithelialized. Original cause of wound was Gradually Appeared. The date acquired was: 11/20/2021. The wound has been in treatment 38 weeks. The wound  is located on the Left,Lateral Ankle. The wound measures 0cm length x 0cm width x 0cm depth; 0cm^2 area and 0cm^3 volume. There is a none present amount of drainage noted. Wound #27 status is Open. Original cause of wound was Gradually Appeared. The date acquired was: 08/23/2022. The wound is located on the Right,Anterior Lower Leg. The wound measures 2cm length x 1cm width x 0.2cm depth; 1.571cm^2 area and 0.314cm^3 volume. There is Fat Layer (Subcutaneous Tissue) exposed. There is no tunneling or undermining noted. There is a medium amount of serosanguineous drainage  noted. The wound margin is distinct with the outline attached to the wound base. There is medium (34-66%) red, pink granulation within the wound bed. There is a medium (34-66%) amount of necrotic tissue within the wound bed including Adherent Slough. The periwound skin appearance did not exhibit: Callus, Crepitus, Excoriation, Induration, Rash, Scarring, Dry/Scaly, Maceration, Atrophie Blanche, Cyanosis, Ecchymosis, Hemosiderin Staining, Mottled, Pallor, Rubor, Erythema. Periwound temperature was noted as No Abnormality. The periwound has tenderness on palpation. Assessment Active Problems ICD-10 Unspecified open wound, right lower leg, initial encounter Chronic venous hypertension (idiopathic) with ulcer of right lower extremity Malignant neoplasm of unspecified site of unspecified female breast Chronic combined systolic (congestive) and diastolic (congestive) heart failure Alzheimer's disease, unspecified Malignant neoplasm of endometrium Patient presents with a one week history of a new wound to the right lower extremity. This appears to be caused by trauma. Her ABIs in office were 1.27 on the right and she has palpable pedal pulses. She should have adequate blood flow for healing. I recommended Hydrofera Blue under 3 layer compression. Follow-up in 1 week. Procedures Wound #27 Pre-procedure diagnosis of Wound #27 is a Venous Leg Ulcer located on the Right,Anterior Lower Leg .Severity of Tissue Pre Debridement is: Fat layer exposed. There was a Excisional Skin/Subcutaneous Tissue Debridement with a total area of 2 sq cm performed by Kalman Shan, DO. With the following instrument(s): Curette to remove Viable and Non-Viable tissue/material. Material removed includes Subcutaneous Tissue and Slough and after achieving pain control using Lidocaine. No specimens were taken. A time out was conducted at 11:11, prior to the start of the procedure. A Minimum amount of bleeding was controlled  with Pressure. The procedure was tolerated well with a pain level of 0 throughout and a pain level of 0 following the procedure. Post Debridement Measurements: 2cm length x 1cm width x 0.2cm depth; 0.314cm^3 volume. Character of Wound/Ulcer Post Debridement is improved. Severity of Tissue Post Debridement is: Fat layer exposed. Post procedure Diagnosis Wound #27: Same as Pre-Procedure Plan Follow-up Appointments: Return Appointment in 1 week. - w/ Dr. Heber [place] and Allayne Butcher Rm # 9 Anesthetic: (In clinic) Topical Lidocaine 5% applied to wound bed Bathing/ Shower/ Hygiene: May shower with protection but do not get wound dressing(s) wet. - use cast protector. May shower and wash wound with soap and water. - with dressing changes may wash with soap and water. Edema Control - Lymphedema / SCD / Other: Elevate legs to the level of the heart or above for 30 minutes daily and/or when sitting, a frequency of: Avoid standing for long periods of time. Patient to wear own compression stockings every day. - apply in the morning and remove at night to right leg. Exercise regularly Moisturize legs daily. - every night before day right leg. Additional Orders / Instructions: Follow Nutritious Diet - High Protein Diet The following medication(s) was prescribed: lidocaine topical 5 % gel gel topical for PRN debridements/pain was prescribed at facility  WOUND #27: - Lower Leg Wound Laterality: Right, Anterior Cleanser: Soap and Water 1 x Per Week/7 Days Discharge Instructions: May shower and wash wound with dial antibacterial soap and water prior to dressing change. Prim Dressing: Hydrofera Blue Ready Foam, 2.5 x2.5 in 1 x Per Week/7 Days ary Discharge Instructions: Apply to wound bed as instructed Secondary Dressing: Woven Gauze Sponge, Non-Sterile 4x4 in 1 x Per Week/7 Days Discharge Instructions: Apply over primary dressing as directed. Com pression Wrap: ThreePress (3 layer compression wrap) 1 x Per Week/7  Days Discharge Instructions: Apply three layer compression as directed. 1. In office sharp debridement 2. Hydrofera Blue under 3 layer compression 3. Follow-up in 1 week Electronic Signature(s) Signed: 08/26/2022 4:20:55 PM By: Kalman Shan DO Entered By: Kalman Shan on 08/26/2022 11:21:03 -------------------------------------------------------------------------------- HxROS Details Patient Name: Date of Service: [name], [name] RNESTINE 08/26/2022 10:45 A M Medical Record Number: [id number] Patient Account Number: [id number] Date of Birth/Sex: Treating RN: 1925-08-30 (88 y.o. [name], [name]: Marda Stalker Other Clinician: Referring Provider: Treating Provider/Extender: Aviva Signs in Treatment: 63 Information Obtained From Patient Eyes Medical History: Negative for: Cataracts; Glaucoma; Optic Neuritis Ear/Nose/Mouth/Throat Medical History: Negative for: Chronic sinus problems/congestion; Middle ear problems Hematologic/Lymphatic Medical History: Positive for: Anemia Negative for: Hemophilia; Human Immunodeficiency Virus; Lymphedema; Sickle Cell Disease Respiratory Medical History: Negative for: Aspiration; Asthma; Chronic Obstructive Pulmonary Disease (COPD); Pneumothorax; Sleep Apnea; Tuberculosis Cardiovascular Medical History: Positive for: Congestive Heart Failure; Hypertension; Peripheral Venous Disease Negative for: Angina; Arrhythmia; Coronary Artery Disease; Hypotension; Myocardial Infarction; Peripheral Arterial Disease; Phlebitis; Vasculitis Gastrointestinal Medical History: Negative for: Cirrhosis ; Colitis; Crohns; Hepatitis A; Hepatitis B; Hepatitis C Endocrine Medical History: Negative for: Type I Diabetes; Type II Diabetes Genitourinary Medical History: Negative for: End Stage Renal Disease Immunological Medical History: Negative for: Lupus Erythematosus; Raynauds; Scleroderma Integumentary  (Skin) Medical History: Negative for: History of Burn Musculoskeletal Medical History: Positive for: Osteoarthritis Negative for: Gout; Rheumatoid Arthritis; Osteomyelitis Neurologic Medical History: Negative for: Neuropathy; Quadriplegia; Paraplegia; Seizure Disorder Oncologic Medical History: Positive for: Received Chemotherapy - Breast and Ovarian Cancer Negative for: Received Radiation Immunizations Pneumococcal Vaccine: Received Pneumococcal Vaccination: Yes Received Pneumococcal Vaccination On or After 60th Birthday: Yes Implantable Devices None Hospitalization / Surgery History Type of Hospitalization/Surgery diverticulits Family and Social History Cancer: Yes - Child; Diabetes: No; Heart Disease: No; Hereditary Spherocytosis: No; Hypertension: Yes - Child,Father; Kidney Disease: Yes - Child; Lung Disease: No; Seizures: No; Stroke: Yes - Siblings,Child; Thyroid Problems: Yes - Child; Tuberculosis: No; Never smoker; Marital Status - Widowed; Alcohol Use: Never; Drug Use: No History; Caffeine Use: Never; Financial Concerns: No; Food, Clothing or Shelter Needs: No; Support System Lacking: No; Transportation Concerns: No Electronic Signature(s) Signed: 08/26/2022 3:46:02 PM By: [name] Signed: 08/26/2022 4:20:55 PM By: Kalman Shan DO Entered By: Kalman Shan on 08/26/2022 11:16:50 -------------------------------------------------------------------------------- SuperBill Details Patient Name: Date of Service: MISHTI, [name] RNESTINE 08/26/2022 Medical Record Number: [id number] Patient Account Number: [id number] Date of Birth/Sex: Treating RN: 03-07-1925 (88 y.o. [name], [name]: Marda Stalker Other Clinician: Referring Provider: Treating Provider/Extender: Aviva Signs in Treatment: 38 Diagnosis Coding ICD-10 Codes Code Description [phone] Unspecified open wound, right lower leg, initial  encounter I87.311 Chronic venous hypertension (idiopathic) with ulcer of right lower extremity C50.919 Malignant neoplasm of unspecified site of unspecified female breast I50.42 Chronic combined systolic (congestive) and diastolic (congestive) heart failure G30.9 Alzheimer's disease, unspecified C54.1 Malignant neoplasm of endometrium Facility Procedures CPT4 Code: [id number] Description:  11042 - DEB SUBQ TISSUE 20 SQ CM/< ICD-10 Diagnosis Description S81.801A Unspecified open wound, right lower leg, initial encounter Modifier: Quantity: 1 Physician Procedures : CPT4 Code Description Modifier [id number] - WC PHYS LEVEL 3 - EST PT ICD-10 Diagnosis Description S81.801A Unspecified open wound, right lower leg, initial encounter I87.311 Chronic venous hypertension (idiopathic) with ulcer of right lower  extremity I50.42 Chronic combined systolic (congestive) and diastolic (congestive) heart failure G30.9 Alzheimer's disease, unspecified Quantity: 1 : [id number] 11042 - WC PHYS SUBQ TISS 20 SQ CM ICD-10 Diagnosis Description S81.801A Unspecified open wound, right lower leg, initial encounter Quantity: 1 Electronic Signature(s) Signed: 08/26/2022 4:20:55 PM By: Kalman Shan DO Entered By: Kalman Shan on 08/26/2022 11:21:08

## 2022-08-31 DIAGNOSIS — I5032 Chronic diastolic (congestive) heart failure: Secondary | ICD-10-CM | POA: Diagnosis not present

## 2022-08-31 DIAGNOSIS — R051 Acute cough: Secondary | ICD-10-CM | POA: Diagnosis not present

## 2022-08-31 DIAGNOSIS — F419 Anxiety disorder, unspecified: Secondary | ICD-10-CM | POA: Diagnosis not present

## 2022-08-31 DIAGNOSIS — Z8542 Personal history of malignant neoplasm of other parts of uterus: Secondary | ICD-10-CM | POA: Diagnosis not present

## 2022-08-31 DIAGNOSIS — Z853 Personal history of malignant neoplasm of breast: Secondary | ICD-10-CM | POA: Diagnosis not present

## 2022-08-31 DIAGNOSIS — I739 Peripheral vascular disease, unspecified: Secondary | ICD-10-CM | POA: Diagnosis not present

## 2022-08-31 DIAGNOSIS — I10 Essential (primary) hypertension: Secondary | ICD-10-CM | POA: Diagnosis not present

## 2022-08-31 DIAGNOSIS — H6123 Impacted cerumen, bilateral: Secondary | ICD-10-CM | POA: Diagnosis not present

## 2022-08-31 DIAGNOSIS — Z0000 Encounter for general adult medical examination without abnormal findings: Secondary | ICD-10-CM | POA: Diagnosis not present

## 2022-09-03 ENCOUNTER — Encounter (HOSPITAL_BASED_OUTPATIENT_CLINIC_OR_DEPARTMENT_OTHER): Payer: Medicare PPO | Admitting: Internal Medicine

## 2022-09-03 DIAGNOSIS — S81801A Unspecified open wound, right lower leg, initial encounter: Secondary | ICD-10-CM

## 2022-09-03 DIAGNOSIS — C50919 Malignant neoplasm of unspecified site of unspecified female breast: Secondary | ICD-10-CM | POA: Diagnosis not present

## 2022-09-03 DIAGNOSIS — C541 Malignant neoplasm of endometrium: Secondary | ICD-10-CM | POA: Diagnosis not present

## 2022-09-03 DIAGNOSIS — I5042 Chronic combined systolic (congestive) and diastolic (congestive) heart failure: Secondary | ICD-10-CM | POA: Diagnosis not present

## 2022-09-03 DIAGNOSIS — I87311 Chronic venous hypertension (idiopathic) with ulcer of right lower extremity: Secondary | ICD-10-CM

## 2022-09-03 DIAGNOSIS — L97812 Non-pressure chronic ulcer of other part of right lower leg with fat layer exposed: Secondary | ICD-10-CM | POA: Diagnosis not present

## 2022-09-03 DIAGNOSIS — F0280 Dementia in other diseases classified elsewhere without behavioral disturbance: Secondary | ICD-10-CM | POA: Diagnosis not present

## 2022-09-03 DIAGNOSIS — G309 Alzheimer's disease, unspecified: Secondary | ICD-10-CM | POA: Diagnosis not present

## 2022-09-03 DIAGNOSIS — I110 Hypertensive heart disease with heart failure: Secondary | ICD-10-CM | POA: Diagnosis not present

## 2022-09-06 ENCOUNTER — Telehealth: Payer: Self-pay | Admitting: Family Medicine

## 2022-09-06 NOTE — Telephone Encounter (Signed)
Patient has no medication for the Quetipine and the pharmacy has sent her daughter multiple messages they are waiting on [place]. Can you please resend to the [facility] on [address]. If there is an issue [phone] [place].

## 2022-09-06 NOTE — Telephone Encounter (Addendum)
Reviewed pt chart. Last prescription was sent on 07/15/22 #180, 3 refills. They should have refills on file at pharmacy.  I called pharmacy at [phone]. Spoke w/ tech. States they were attempting to refill an old script. They will get prescription ready for pt, nothing further needed. I called daughter and updated her. LVM.

## 2022-09-09 NOTE — Progress Notes (Signed)
ROLLANDE, THURSBY ([id number]) [id number] Page 1 of 8 Visit Report for 09/03/2022 Arrival Information Details Patient Name: Date of Service: BELLAMIA, FERCH 09/03/2022 10:00 A M Medical Record Number: [id number] Patient Account Number: [id number] Date of Birth/Sex: Treating RN: 08/08/1925 (88 y.o. [name], [name] Primary Care Jaylyn Booher: Marda Stalker Other Clinician: Referring Shervon Kerwin: Treating Odin Mariani/Extender: Aviva Signs in Treatment: 64 Visit Information History Since Last Visit Added or deleted any medications: No Patient Arrived: Wheel Chair Any new allergies or adverse reactions: No Arrival Time: 10:36 Had a fall or experienced change in No Accompanied By: Daughter activities of daily living that may affect Transfer Assistance: None risk of falls: Patient Identification Verified: Yes Signs or symptoms of abuse/neglect since last visito No Secondary Verification Process Completed: Yes Hospitalized since last visit: No Patient Requires Transmission-Based Precautions: No Implantable device outside of the clinic excluding No Patient Has Alerts: No cellular tissue based products placed in the center since last visit: Has Compression in Place as Prescribed: Yes Pain Present Now: No Electronic Signature(s) Signed: 09/03/2022 11:48:14 AM By: Erenest Blank Entered By: Erenest Blank on 09/03/2022 10:37:18 -------------------------------------------------------------------------------- Compression Therapy Details Patient Name: Date of Service: KEARSTYN, AVITIA RNESTINE 09/03/2022 10:00 A M Medical Record Number: [id number] Patient Account Number: [id number] Date of Birth/Sex: Treating RN: 09-12-1925 (88 y.o. [name], [name] Primary Care Jerolyn Flenniken: Marda Stalker Other Clinician: Referring Brileigh Sevcik: Treating Yamari Ventola/Extender: Aviva Signs in Treatment: 40 Compression Therapy  Performed for Wound Assessment: Wound #27 Right,Anterior Lower Leg Performed By: Clinician Rhae Hammock, RN Compression Type: Three Layer Post Procedure Diagnosis Same as Pre-procedure Electronic Signature(s) Signed: 09/09/2022 4:05:37 PM By: [name] Entered By: Rhae Hammock on 09/03/2022 10:55:57 [name] ([id number]) [id number] Page 2 of 8 -------------------------------------------------------------------------------- Encounter Discharge Information Details Patient Name: Date of Service: JAQUIA, BENEDICTO 09/03/2022 10:00 A M Medical Record Number: [id number] Patient Account Number: [id number] Date of Birth/Sex: Treating RN: Apr 06, 1925 (88 y.o. [name], [name] Primary Care Kelvyn Schunk: Marda Stalker Other Clinician: Referring Olivia Pavelko: Treating Tannah Dreyfuss/Extender: Aviva Signs in Treatment: 29 Encounter Discharge Information Items Discharge Condition: Stable Ambulatory Status: Wheelchair Discharge Destination: Home Transportation: Private Auto Accompanied By: self Schedule Follow-up Appointment: Yes Clinical Summary of Care: Patient Declined Electronic Signature(s) Signed: 09/09/2022 4:05:37 PM By: [name] Entered By: Rhae Hammock on 09/03/2022 10:55:40 -------------------------------------------------------------------------------- Lower Extremity Assessment Details Patient Name: Date of Service: ARRIA, NAIM RNESTINE 09/03/2022 10:00 A M Medical Record Number: [id number] Patient Account Number: [id number] Date of Birth/Sex: Treating RN: 1925-04-15 (88 y.o. [name], [name] Primary Care Brentley Landfair: Marda Stalker Other Clinician: Referring Jedidiah Demartini: Treating Teon Hudnall/Extender: Marjo Bicker Weeks in Treatment: 40 Edema Assessment Assessed: [Left: No] Patrice Paradise: No] Edema: [Left: N] [Right: o] Calf Left: Right: Point of Measurement: 30 cm  From Medial Instep 30.5 cm Ankle Left: Right: Point of Measurement: 9 cm From Medial Instep 23 cm Electronic Signature(s) Signed: 09/03/2022 11:48:14 AM By: Erenest Blank Signed: 09/09/2022 4:05:37 PM By: [name] Entered By: Erenest Blank on 09/03/2022 10:40:30 -------------------------------------------------------------------------------- Multi Wound Chart Details Patient Name: Date of Service: MODESTY, RUDY RNESTINE 09/03/2022 10:00 A M Medical Record Number: [id number] Patient Account Number: [id number] Date of Birth/Sex: Treating RN: 20-Mar-1925 (88 y.o. [name], [name] Primary Care Anneth Brunell: Marda Stalker Other Clinician: Referring Jannice Beitzel: Treating Janay Canan/Extender: Marjo Bicker [place], Carlyle Basques ([id number]) [id number] Page 3 of 8 Weeks in Treatment: 40 Vital Signs Height(in): 66 Pulse(bpm): 65 Weight(lbs): 176 Blood Pressure(mmHg): 146/87 Body Mass Index(BMI): 28.4 Temperature(F): 97.9 Respiratory  Rate(breaths/min): 18 [27:Photos:] [N/A:N/A] Right, Anterior Lower Leg N/A N/A Wound Location: Gradually Appeared N/A N/A Wounding Event: Venous Leg Ulcer N/A N/A Primary Etiology: Anemia, Congestive Heart Failure, N/A N/A Comorbid History: Hypertension, Peripheral Venous Disease, Osteoarthritis, Received Chemotherapy 08/23/2022 N/A N/A Date Acquired: 1 N/A N/A Weeks of Treatment: Open N/A N/A Wound Status: No N/A N/A Wound Recurrence: 1.5x0.5x0.2 N/A N/A Measurements L x W x D (cm) 0.589 N/A N/A A (cm) : rea 0.118 N/A N/A Volume (cm) : 62.50% N/A N/A % Reduction in A rea: 62.40% N/A N/A % Reduction in Volume: Full Thickness With Exposed Support N/A N/A Classification: Structures Medium N/A N/A Exudate A mount: Serosanguineous N/A N/A Exudate Type: red, brown N/A N/A Exudate Color: Distinct, outline attached N/A N/A Wound Margin: Medium (34-66%) N/A N/A Granulation A  mount: Red, Pink N/A N/A Granulation Quality: Medium (34-66%) N/A N/A Necrotic A mount: Fat Layer (Subcutaneous Tissue): Yes N/A N/A Exposed Structures: Fascia: No Tendon: No Muscle: No Joint: No Bone: No Small (1-33%) N/A N/A Epithelialization: Debridement - Excisional N/A N/A Debridement: Pre-procedure Verification/Time Out 11:00 N/A N/A Taken: Lidocaine N/A N/A Pain Control: Subcutaneous, Slough N/A N/A Tissue Debrided: Skin/Subcutaneous Tissue N/A N/A Level: 0.75 N/A N/A Debridement A (sq cm): rea Curette N/A N/A Instrument: Minimum N/A N/A Bleeding: Pressure N/A N/A Hemostasis A chieved: 0 N/A N/A Procedural Pain: 0 N/A N/A Post Procedural Pain: Procedure was tolerated well N/A N/A Debridement Treatment Response: 1.5x0.5x0.2 N/A N/A Post Debridement Measurements L x W x D (cm) 0.118 N/A N/A Post Debridement Volume: (cm) Excoriation: No N/A N/A Periwound Skin Texture: Induration: No Callus: No Crepitus: No Rash: No Scarring: No Maceration: No N/A N/A Periwound Skin Moisture: Dry/Scaly: No Atrophie Blanche: No N/A N/A Periwound Skin Color: Cyanosis: No Ecchymosis: No Erythema: No Hemosiderin Staining: No Mottled: No Pallor: No CAPILI, Keandra ([id number]) [id number] Page 4 of 8 Rubor: No No Abnormality N/A N/A Temperature: Yes N/A N/A Tenderness on Palpation: Compression Therapy N/A N/A Procedures Performed: Debridement Treatment Notes Wound #27 (Lower Leg) Wound Laterality: Right, Anterior Cleanser Soap and Water Discharge Instruction: May shower and wash wound with dial antibacterial soap and water prior to dressing change. Peri-Wound Care Topical Primary Dressing Hydrofera Blue Ready Foam, 2.5 x2.5 in Discharge Instruction: Apply to wound bed as instructed Secondary Dressing Woven Gauze Sponge, Non-Sterile 4x4 in Discharge Instruction: Apply over primary dressing as directed. Secured  With Compression Wrap ThreePress (3 layer compression wrap) Discharge Instruction: Apply three layer compression as directed. Compression Stockings Add-Ons Electronic Signature(s) Signed: 09/03/2022 12:13:57 PM By: Kalman Shan DO Signed: 09/09/2022 4:05:37 PM By: [name] Entered By: Kalman Shan on 09/03/2022 11:08:27 -------------------------------------------------------------------------------- Multi-Disciplinary Care Plan Details Patient Name: Date of Service: AVANI, SENSABAUGH RNESTINE 09/03/2022 10:00 A M Medical Record Number: [id number] Patient Account Number: [id number] Date of Birth/Sex: Treating RN: 1925/10/18 (88 y.o. [name], [name] Primary Care Suleiman Finigan: Marda Stalker Other Clinician: Referring Jaxxen Voong: Treating Willim Turnage/Extender: Marjo Bicker Weeks in Treatment: 40 Active Inactive Wound/Skin Impairment Nursing Diagnoses: Impaired tissue integrity Goals: Patient/caregiver will verbalize understanding of skin care regimen Date Initiated: 11/27/2021 Target Resolution Date: 09/18/2022 Goal Status: Active Ulcer/skin breakdown will have a volume reduction of 30% by week 4 Date Initiated: 11/27/2021 Date Inactivated: 12/31/2021 Target Resolution Date: 12/25/2021 Unmet Reason: according to Goal Status: Unmet measurements has not reduced. EVONNA, STOLTZ ([id number]) [id number] Page 5 of 8 Interventions: Assess patient/caregiver ability to obtain necessary supplies Assess patient/caregiver ability to perform ulcer/skin care regimen upon admission and as needed  Assess ulceration(s) every visit Provide education on ulcer and skin care Treatment Activities: Topical wound management initiated : 11/27/2021 Notes: Electronic Signature(s) Signed: 09/09/2022 4:05:37 PM By: [name] Entered By: Rhae Hammock on 09/03/2022  10:32:22 -------------------------------------------------------------------------------- Pain Assessment Details Patient Name: Date of Service: ENVY, MENO RNESTINE 09/03/2022 10:00 [place] Record Number: [id number] Patient Account Number: [id number] Date of Birth/Sex: Treating RN: 1925-08-29 (88 y.o. [name], [name] Primary Care Kimberle Stanfill: Marda Stalker Other Clinician: Referring Noell Lorensen: Treating Mcclellan Demarais/Extender: Marjo Bicker Weeks in Treatment: 40 Active Problems Location of Pain Severity and Description of Pain Patient Has Paino No Site Locations Pain Management and Medication Current Pain Management: Electronic Signature(s) Signed: 09/03/2022 11:48:14 AM By: Erenest Blank Signed: 09/09/2022 4:05:37 PM By: [name] Entered By: Erenest Blank on 09/03/2022 10:38:02 Patient/Caregiver Education Details -------------------------------------------------------------------------------- [name] ([id number]) [id number] Page 6 of 8 Patient Name: Date of Service: WILLO, YOON 10/13/2023andnbsp10:00 A M Medical Record Number: [id number] Patient Account Number: [id number] Date of Birth/Gender: Treating RN: 1925-01-03 (88 y.o. [name], [name] Primary Care Physician: Marda Stalker Other Clinician: Referring Physician: Treating Physician/Extender: Aviva Signs in Treatment: 54 Education Assessment Education Provided To: Patient Education Topics Provided Wound/Skin Impairment: Methods: Explain/Verbal Responses: Reinforcements needed, State content correctly Electronic Signature(s) Signed: 09/09/2022 4:05:37 PM By: [name] Entered By: Rhae Hammock on 09/03/2022 10:31:45 -------------------------------------------------------------------------------- Wound Assessment Details Patient Name: Date of Service: BAO, COREAS RNESTINE 09/03/2022 10:00  [place] Record Number: [id number] Patient Account Number: [id number] Date of Birth/Sex: Treating RN: 10/14/1925 (88 y.o. [name], [name] Primary Care Kamron Portee: Marda Stalker Other Clinician: Referring Tanav Orsak: Treating Courtland Reas/Extender: Marjo Bicker Weeks in Treatment: 40 Wound Status Wound Number: 27 Primary Venous Leg Ulcer Etiology: Wound Location: Right, Anterior Lower Leg Wound Open Wounding Event: Gradually Appeared Status: Date Acquired: 08/23/2022 Comorbid Anemia, Congestive Heart Failure, Hypertension, Peripheral Weeks Of Treatment: 1 History: Venous Disease, Osteoarthritis, Received Chemotherapy Clustered Wound: No Photos Wound Measurements Length: (cm) 1.5 Width: (cm) 0.5 Depth: (cm) 0.2 Area: (cm) 0.589 Volume: (cm) 0.118 % Reduction in Area: 62.5% % Reduction in Volume: 62.4% Epithelialization: Small (1-33%) Tunneling: No Undermining: No Wound Description Classification: Full Thickness With Exposed Support S Wound Margin: Distinct, outline attached Exudate Amount: Medium Minehart, Ahonesty ([id number]) Exudate Type: Serosanguineous Exudate Color: red, brown tructures Foul Odor After Cleansing: No Slough/Fibrino Yes [id number] Page 7 of 8 Wound Bed Granulation Amount: Medium (34-66%) Exposed Structure Granulation Quality: Red, Pink Fascia Exposed: No Necrotic Amount: Medium (34-66%) Fat Layer (Subcutaneous Tissue) Exposed: Yes Necrotic Quality: Adherent Slough Tendon Exposed: No Muscle Exposed: No Joint Exposed: No Bone Exposed: No Periwound Skin Texture Texture Color No Abnormalities Noted: No No Abnormalities Noted: No Callus: No Atrophie Blanche: No Crepitus: No Cyanosis: No Excoriation: No Ecchymosis: No Induration: No Erythema: No Rash: No Hemosiderin Staining: No Scarring: No Mottled: No Pallor: No Moisture Rubor: No No Abnormalities Noted: No Dry / Scaly: No  Temperature / Pain Maceration: No Temperature: No Abnormality Tenderness on Palpation: Yes Treatment Notes Wound #27 (Lower Leg) Wound Laterality: Right, Anterior Cleanser Soap and Water Discharge Instruction: May shower and wash wound with dial antibacterial soap and water prior to dressing change. Peri-Wound Care Topical Primary Dressing Hydrofera Blue Ready Foam, 2.5 x2.5 in Discharge Instruction: Apply to wound bed as instructed Secondary Dressing Woven Gauze Sponge, Non-Sterile 4x4 in Discharge Instruction: Apply over primary dressing as directed. Secured With Compression Wrap ThreePress (3 layer compression wrap) Discharge Instruction: Apply three layer compression  as directed. Compression Stockings Add-Ons Electronic Signature(s) Signed: 09/03/2022 11:48:14 AM By: Erenest Blank Signed: 09/09/2022 4:05:37 PM By: [name] Entered By: Erenest Blank on 09/03/2022 10:55:00 -------------------------------------------------------------------------------- Vitals Details Patient Name: Date of Service: TAQUANNA, BORRAS RNESTINE 09/03/2022 10:00 A M Medical Record Number: [id number] Patient Account Number: [id number] Date of Birth/Sex: Treating RN: Jan 05, 1925 (88 y.o. Benjaman Lobe Primary Care Sarahann Horrell: Marda Stalker Other Clinician: [name] ([id number]) [id number] Page 8 of 8 Referring Tamme Mozingo: Treating Liza Czerwinski/Extender: Aviva Signs in Treatment: 40 Vital Signs Time Taken: 10:37 Temperature (F): 97.9 Height (in): 66 Pulse (bpm): 65 Weight (lbs): 176 Respiratory Rate (breaths/min): 18 Body Mass Index (BMI): 28.4 Blood Pressure (mmHg): 146/87 Reference Range: 80 - 120 mg / dl Electronic Signature(s) Signed: 09/03/2022 11:48:14 AM By: Erenest Blank Entered By: Erenest Blank on 09/03/2022 10:37:54

## 2022-09-09 NOTE — Progress Notes (Signed)
TORRENCE, BRANAGAN ([id number]) [id number] Page 1 of 11 Visit Report for 09/03/2022 Chief Complaint Document Details Patient Name: Date of Service: [name], [name] 09/03/2022 10:00 A M Medical Record Number: [id number] Patient Account Number: [id number] Date of Birth/Sex: Treating RN: November 06, 1925 (88 y.o. [name], [name]: Marda Stalker Other Clinician: Referring Provider: Treating Provider/Extender: Marjo Bicker Weeks in Treatment: 40 Information Obtained from: Patient Chief Complaint 11/27/2021; Left lower extremity wound 08/26/2022; right lower extremity wound Electronic Signature(s) Signed: 09/03/2022 12:13:57 PM By: Kalman Shan DO Entered By: Kalman Shan on 09/03/2022 11:08:35 -------------------------------------------------------------------------------- Debridement Details Patient Name: Date of Service: [name], [name] 09/03/2022 10:00 [place] Record Number: [id number] Patient Account Number: [id number] Date of Birth/Sex: Treating RN: [birth year] (88 y.o. [name], [name]: Marda Stalker Other Clinician: Referring Provider: Treating Provider/Extender: Aviva Signs in Treatment: 40 Debridement Performed for Assessment: Wound #27 Right,Anterior Lower Leg Performed By: Physician Kalman Shan, DO Debridement Type: Debridement Severity of Tissue Pre Debridement: Fat layer exposed Level of Consciousness (Pre-procedure): Awake and Alert Pre-procedure Verification/Time Out Yes - 11:00 Taken: Start Time: 11:00 Pain Control: Lidocaine T Area Debrided (L x W): otal 1.5 (cm) x 0.5 (cm) = 0.75 (cm) Tissue and other material debrided: Viable, Non-Viable, Slough, Subcutaneous, Slough Level: Skin/Subcutaneous Tissue Debridement Description: Excisional Instrument: Curette Bleeding: Minimum Hemostasis Achieved: Pressure End  Time: 11:00 Procedural Pain: 0 Post Procedural Pain: 0 Response to Treatment: Procedure was tolerated well Level of Consciousness (Post- Awake and Alert procedure): Post Debridement Measurements of Total Wound Length: (cm) 1.5 Width: (cm) 0.5 Depth: (cm) 0.2 Volume: (cm) 0.118 Character of Wound/Ulcer Post Debridement: Improved [name], [name] ([id number]) [id number] Page 2 of 11 Severity of Tissue Post Debridement: Fat layer exposed Post Procedure Diagnosis Same as Pre-procedure Electronic Signature(s) Signed: 09/03/2022 11:48:14 AM By: Erenest Blank Signed: 09/03/2022 12:13:57 PM By: Kalman Shan DO Signed: 09/09/2022 4:05:37 PM By: [name] Entered By: Erenest Blank on 09/03/2022 11:02:01 -------------------------------------------------------------------------------- HPI Details Patient Name: Date of Service: [name], [name] 09/03/2022 10:00 [place] Record Number: [id number] Patient Account Number: [id number] Date of Birth/Sex: Treating RN: [birth year] (88 y.o. [name], [name]: Marda Stalker Other Clinician: Referring Provider: Treating Provider/Extender: Aviva Signs in Treatment: 80 History of Present Illness HPI Description: this is a patient we know from several prior wounds on her bilateral lower extremities. She has venous stasis physiology, inflammation and hypertension. She is been fully evaluated for venous ablation and was found not to be a candidate. She does not have significant arterial disease. 02/14/15 the wound itself on her lateral left leg did not look too bad however there was surrounding maceration which was concerning 02/27/15 the wound itself is small with surrounding circumferential epithelialization there is no surrounding maceration. 03/06/15 only a very small area remains. I think this is mostly epithelialized however I would be uncomfortable  not dressing this this week 03/13/15 the area is epithelialized. There was a thick surface on this. I took a #15 blade then lightly disrupted it but there does not appear to be any open area I did not see anything but appropriate epithelium Readmission: 08/16/18 on evaluation today patient presents for initial evaluation and our clinic released readmission although she has not been seen since April 2016. Nonetheless she is having issues with the ulcers currently on the bilateral lateral malleolus or locations as well as the left lateral lower extremity. These have been present for roughly 3  weeks since the patient actually developed significant bilateral lower extremity edema which patient's daughter states was roughly 3 times the size of her legs currently. Subsequently she ended up in the emerging department due to congestive heart failure. They were able to get the swelling under control and fortunately she seems to be doing much better at this point in time. She was placed in an [employer] which she sat on for week part evaluation today as well. Fortunately the wound areas actually seem to be doing fairly well there is some macerated skin surrounding there are the areas/surface of the wound which are gonna require some debridement at this point. No fevers, chills, nausea, or vomiting noted at this time. Patient has a history of hypertension, mild peripheral vascular disease, chronic venous stasis, and other than the ulcer seems to be doing fairly well today. No fevers, chills, nausea, or vomiting noted at this time. 08/23/18 on evaluation today patient actually appears to be doing great in regard to her bilateral lower should be ulcers. She's shown signs of improvement even just with one week with the [employer]. In general I'm actually very pleased with how things appear at this point. 09/06/18 on evaluation today patient actually appears to be doing much better her left lower extremity is  completely healed. Go to the right lower extremity lateral malleolus ulcer this still seems to be showing signs of being open. There does not appear to be evidence of infection which is good news. This did require some sharp debridement however to remove some of the necrotic tissue/eschar on the surface of the wound Readmission 11/27/2021 Ms. Kyana Aicher is a 88 year old female with a past medical history of endometrial and breast cancer, chronic venous insufficiency, dementia and essential hypertension that presents to the clinic with a 1 month history of nonhealing ulcer to the left lower extremity. She states that the ulcer opened 1 month ago and then healed but has reopened again in the past week. She is currently keeping the area covered. She has a history of wounds to her lower extremities. She has compression stockings that she uses daily. She currently denies signs of infection. 1/13; patient presents for follow-up. She followed up for nurse visit for wrap change. She reports more odor. She denies pain. 1/20; patient presents for follow-up. She has been using gentamicin ointment with calcium alginate to the wound bed. She reports improvement in odor and drainage. She currently denies systemic signs of infection. 1/27; patient presents for follow-up. She has been using gentamicin ointment with calcium alginate. She received her Keystone antibiotic 2 days ago. She brought this in. Home health has also been established. They will come out for the first time next week. She currently denies signs of infection. She has no issues or complaints today. 2/2; patient presents for follow-up. She has been using Keystone antibiotics with [facility] with no issues. She denies signs of infection. She has no issues or complaints today. 2/9; patient presents for follow-up. She has been using Keystone antibiotics with [facility] with no issues. She has tolerated the compression wrap well. She denies  signs of infection. 2/16; patient presents for follow-up. She has been using Keystone antibiotics with Hydrofera Blue under compression wrap with no issues. She currently denies signs of infection. 2/23; patient presents for follow-up. She continues to use Keystone antibiotics with Hydrofera Blue under the compression wrap. She has no issues or complaints today. She denies signs of infection. Home health continues to  come out and change the wrap. [name], [name] ([id number]) [id number] Page 3 of 67 3/2; patient presents for follow-up. She continues to use [facility] with [facility] under compression with no issues. She denies signs of infection. 3/16; patient presents for follow-up. She continues to use [facility] with [facility] under compression with no issues. Home health change the dressing last week. She denies signs of infection. 3/23; patient presents for follow-up. She continues to use Keystone antibiotics with Hydrofera Blue under compression with no issues. Home health continues to change the dressings. She denies signs of infection. 3/30; patient presents for follow-up. She continues to use Keystone antibiotics with Hydrofera Blue under compression with no issues. Home health continues to change the dressings. 4/13 2-week follow-up. Keystone and Hydrofera Blue wound is roughly 2.3 cm in diameter smaller. No other issues 4/20; patient presents for follow-up. She continues to use Keystone antibiotic with [facility] with no issues. 4/28; patient with a difficult wound on her right medial lower leg. Using [facility] Blue minimal improvement per our intake nurse we are using compression. She has home health changing the dressing 5/5; patient presents for follow-up. We have been using Keystone antibiotics and Hydrofera Blue under compression therapy. She has no issues or complaints today. We discussed potentially using an advanced tissue  product. She states she has had this done in the past and would like to proceed to see what her insurance would cover. 5/15; patient presents for follow-up. She has been using Keystone antibiotics and Hydrofera Blue under compression therapy. She forgot her Keystone antibiotics today. Insurance has not approved her for skin substitute. She currently denies signs of infection. She has home health that changes the dressing once a week. 5/19; patient presents for follow-up. We have been using Keystone antibiotic and Hydrofera Blue under compression therapy. She has no issues or complaints today. The [employer] rep confirmed over the phone that the patient has been 100% approved for their skin substitute. We will verify this as we have not received any paperwork stating this. Patient states she would like to proceed with a skin substitute if it is covered by insurance. 6/1; patient presents for follow-up. We have been using Keystone antibiotic and Hydrofera Blue under compression therapy. We confirmed that she does have a co-pay for the [employer] skin substitute. We will hold off on using this for now since she continues to show improvement with current therapy. She denies signs of infection. She has home health that comes out twice weekly. 6/16; patient presents for follow-up. We have been using Keystone antibiotic and Hydrofera Blue under compression therapy. She has no issues or complaints today. We discussed the potential trial of vendaje. This is placenta tissue. She states she would like to try the free trial. We will reach out to our rep. 6/30; patient presents for follow-up. She has been using Keystone antibiotic and Hydrofera Blue under compression therapy. She has been approved for a trial of vendaje. She would like to proceed with this and We will have this available at next clinic visit. She has no issues or complaints today. 7/18; patient presents for follow-up. She has been using Keystone  antibiotic and Hydrofera Blue under compression. She has no issues or complaints today. We have a free sample of vendaje skin substitute and patient is agreeable to having this placed today. 7/24; patient presents for follow-up. We placed the first free trial of vendaje last week under compression wrap. Patient had no issues or complaints today. She  tolerated this well. 8/1; patient presents for follow-up. Vandaje #2 donated product was placed in standard fashion last week under compression therapy. She has no issues or complaints today. 8/8; patient presents for follow-up. Vendaje #3 donated skin set was placed in standard fashion last visit under compression therapy. She has no issues or complaints today. 8/15; Patient presents for follow-up. Vandaje #4 was placed in standard fashion last week under compression therapy. She has no issues or complaints today. 8/22; patient presents for follow-up. Vandaje #5 was placed in standard fashion last week under compression therapy. She tolerated this well. 8/29; patient presents for follow-up. We have been using Xeroform under compression therapy and she has tolerated this well. She has no issues or complaints today. She has home health that is been coming out weekly For dressing changes. 9/12; patient presents for follow-up. We have been using Xeroform under compression therapy. Home health has been changing the dressings. She has no issues or complaints today. 9/19; patient presents for follow-up. We have been using Xeroform under compression therapy. She has no issues or complaints today. 9/26; patient presents for follow-up. We have been using Xeroform under compression therapy. She has no issues or complaints today. Admission 08/26/2022 Ms. Lavaeh Bau is a 88 year old female that was recently discharged from our clinic last week due to a closed wound to the left lower extremity. Unfortunately she has developed a new wound to the right lower  extremity. It appears that this is from trauma. 10/13; patient presents for follow-up. She tolerated the compression wrap well. She has no issues or complaints today. We have been using Santyl and Hydrofera Blue under the wrap. Electronic Signature(s) Signed: 09/03/2022 12:13:57 PM By: Kalman Shan DO Entered By: Kalman Shan on 09/03/2022 11:09:07 Physical Exam Details -------------------------------------------------------------------------------- [name] ([id number]) [id number] Page 4 of 11 Patient Name: Date of Service: JIMMA, ORTMAN 09/03/2022 10:00 A M Medical Record Number: [id number] Patient Account Number: [id number] Date of Birth/Sex: Treating RN: 06-26-1925 (88 y.o. [name], [name]: Marda Stalker Other Clinician: Referring Provider: Treating Provider/Extender: Marjo Bicker Weeks in Treatment: 40 Constitutional respirations regular, non-labored and within target range for patient.. Cardiovascular 2+ dorsalis pedis/posterior tibialis pulses. Psychiatric pleasant and cooperative. Notes Right lower extremity: T the anterior aspect there is an open wound with granulation tissue and nonviable tissue. o Electronic Signature(s) Signed: 09/03/2022 12:13:57 PM By: Kalman Shan DO Entered By: Kalman Shan on 09/03/2022 11:09:32 -------------------------------------------------------------------------------- Physician Orders Details Patient Name: Date of Service: [name], [name] 09/03/2022 10:00 A M Medical Record Number: [id number] Patient Account Number: [id number] Date of Birth/Sex: Treating RN: June 19, 1925 (88 y.o. [name], [name]: Marda Stalker Other Clinician: Referring Provider: Treating Provider/Extender: Aviva Signs in Treatment: 8 Verbal / Phone Orders: No Diagnosis Coding Follow-up  Appointments ppointment in 1 week. - w/ Dr. Heber [place] and Elias Else # 9 Return A Anesthetic (In clinic) Topical Lidocaine 5% applied to wound bed Bathing/ Shower/ Hygiene May shower with protection but do not get wound dressing(s) wet. - use cast protector. May shower and wash wound with soap and water. - with dressing changes may wash with soap and water. Edema Control - Lymphedema / SCD / Other Elevate legs to the level of the heart or above for 30 minutes daily and/or when sitting, a frequency of: Avoid standing for long periods of time. Patient to wear own compression stockings every day. - apply in the morning and remove at night to right  leg. Exercise regularly Moisturize legs daily. - every night before day right leg. Additional Orders / Instructions Follow Nutritious Diet - High Protein Diet Wound Treatment Wound #27 - Lower Leg Wound Laterality: Right, Anterior Cleanser: Soap and Water 1 x Per Week/7 Days Discharge Instructions: May shower and wash wound with dial antibacterial soap and water prior to dressing change. Prim Dressing: Hydrofera Blue Ready Foam, 2.5 x2.5 in 1 x Per Week/7 Days ary Discharge Instructions: Apply to wound bed as instructed Prim Dressing: Santyl Ointment 1 x Per Week/7 Days ary Discharge Instructions: Apply nickel thick amount to wound bed as instructed Secondary Dressing: Woven Gauze Sponge, Non-Sterile 4x4 in 1 x Per Week/7 Days Discharge Instructions: Apply over primary dressing as directed. [name], [name] ([id number]) [id number] Page 5 of 11 Compression Wrap: ThreePress (3 layer compression wrap) 1 x Per Week/7 Days Discharge Instructions: Apply three layer compression as directed. Electronic Signature(s) Signed: 09/03/2022 12:13:57 PM By: Kalman Shan DO Entered By: Kalman Shan on 09/03/2022 11:09:52 -------------------------------------------------------------------------------- Problem List  Details Patient Name: Date of Service: TALYNN, LEBON [name] 09/03/2022 10:00 A M Medical Record Number: [id number] Patient Account Number: [id number] Date of Birth/Sex: Treating RN: [birth year] (88 y.o. [name], [name]: Marda Stalker Other Clinician: Referring Provider: Treating Provider/Extender: Aviva Signs in Treatment: 60 Active Problems ICD-10 Encounter Code Description Active Date MDM Diagnosis S81.801A Unspecified open wound, right lower leg, initial encounter 08/26/2022 No Yes I87.311 Chronic venous hypertension (idiopathic) with ulcer of right lower extremity 08/26/2022 No Yes C50.919 Malignant neoplasm of unspecified site of unspecified female breast 11/27/2021 No Yes I50.42 Chronic combined systolic (congestive) and diastolic (congestive) heart failure 11/27/2021 No Yes G30.9 Alzheimer's disease, unspecified 11/27/2021 No Yes C54.1 Malignant neoplasm of endometrium 11/27/2021 No Yes Inactive Problems Resolved Problems ICD-10 Code Description Active Date Resolved Date I87.312 Chronic venous hypertension (idiopathic) with ulcer of left lower extremity 11/27/2021 11/27/2021 F02.774 Non-pressure chronic ulcer of other part of left lower leg with fat layer exposed 11/27/2021 11/27/2021 Electronic Signature(s) Signed: 09/03/2022 12:13:57 PM By: Kalman Shan DO Entered By: Kalman Shan on 09/03/2022 11:08:22 [name] ([id number]) [id number] Page 6 of 11 -------------------------------------------------------------------------------- Progress Note Details Patient Name: Date of Service: [name], [name] 09/03/2022 10:00 A M Medical Record Number: [id number] Patient Account Number: [id number] Date of Birth/Sex: Treating RN: August 30, 1925 (88 y.o. [name], [name]: Marda Stalker Other Clinician: Referring Provider: Treating Provider/Extender: Aviva Signs in Treatment: 79 Subjective Chief Complaint Information obtained from Patient 11/27/2021; Left lower extremity wound 08/26/2022; right lower extremity wound History of Present Illness (HPI) this is a patient we know from several prior wounds on her bilateral lower extremities. She has venous stasis physiology, inflammation and hypertension. She is been fully evaluated for venous ablation and was found not to be a candidate. She does not have significant arterial disease. 02/14/15 the wound itself on her lateral left leg did not look too bad however there was surrounding maceration which was concerning 02/27/15 the wound itself is small with surrounding circumferential epithelialization there is no surrounding maceration. 03/06/15 only a very small area remains. I think this is mostly epithelialized however I would be uncomfortable not dressing this this week 03/13/15 the area is epithelialized. There was a thick surface on this. I took a #15 blade then lightly disrupted it but there does not appear to be any open area I did not see anything but appropriate epithelium Readmission: 08/16/18 on evaluation today patient presents for initial  evaluation and our clinic released readmission although she has not been seen since April 2016. Nonetheless she is having issues with the ulcers currently on the bilateral lateral malleolus or locations as well as the left lateral lower extremity. These have been present for roughly 3 weeks since the patient actually developed significant bilateral lower extremity edema which patient's daughter states was roughly 3 times the size of her legs currently. Subsequently she ended up in the emerging department due to congestive heart failure. They were able to get the swelling under control and fortunately she seems to be doing much better at this point in time. She was placed in an [employer] which she sat on for week part evaluation today  as well. Fortunately the wound areas actually seem to be doing fairly well there is some macerated skin surrounding there are the areas/surface of the wound which are gonna require some debridement at this point. No fevers, chills, nausea, or vomiting noted at this time. Patient has a history of hypertension, mild peripheral vascular disease, chronic venous stasis, and other than the ulcer seems to be doing fairly well today. No fevers, chills, nausea, or vomiting noted at this time. 08/23/18 on evaluation today patient actually appears to be doing great in regard to her bilateral lower should be ulcers. She's shown signs of improvement even just with one week with the [employer]. In general I'm actually very pleased with how things appear at this point. 09/06/18 on evaluation today patient actually appears to be doing much better her left lower extremity is completely healed. Go to the right lower extremity lateral malleolus ulcer this still seems to be showing signs of being open. There does not appear to be evidence of infection which is good news. This did require some sharp debridement however to remove some of the necrotic tissue/eschar on the surface of the wound Readmission 11/27/2021 Ms. Brietta Manso is a 88 year old female with a past medical history of endometrial and breast cancer, chronic venous insufficiency, dementia and essential hypertension that presents to the clinic with a 1 month history of nonhealing ulcer to the left lower extremity. She states that the ulcer opened 1 month ago and then healed but has reopened again in the past week. She is currently keeping the area covered. She has a history of wounds to her lower extremities. She has compression stockings that she uses daily. She currently denies signs of infection. 1/13; patient presents for follow-up. She followed up for nurse visit for wrap change. She reports more odor. She denies pain. 1/20; patient presents for  follow-up. She has been using gentamicin ointment with calcium alginate to the wound bed. She reports improvement in odor and drainage. She currently denies systemic signs of infection. 1/27; patient presents for follow-up. She has been using gentamicin ointment with calcium alginate. She received her Keystone antibiotic 2 days ago. She brought this in. Home health has also been established. They will come out for the first time next week. She currently denies signs of infection. She has no issues or complaints today. 2/2; patient presents for follow-up. She has been using Keystone antibiotics with [facility] with no issues. She denies signs of infection. She has no issues or complaints today. 2/9; patient presents for follow-up. She has been using Keystone antibiotics with [facility] with no issues. She has tolerated the compression wrap well. She denies signs of infection. 2/16; patient presents for follow-up. She has been using Keystone antibiotics with [employer]  under compression wrap with no issues. She currently denies signs of infection. 2/23; patient presents for follow-up. She continues to use Keystone antibiotics with Hydrofera Blue under the compression wrap. She has no issues or complaints today. She denies signs of infection. Home health continues to come out and change the wrap. 3/2; patient presents for follow-up. She continues to use [facility] with [facility] under compression with no issues. She denies signs of infection. 3/16; patient presents for follow-up. She continues to use [facility] with [facility] under compression with no issues. Home health change the dressing last week. She denies signs of infection. 3/23; patient presents for follow-up. She continues to use Keystone antibiotics with Hydrofera Blue under compression with no issues. Home health continues to change the dressings. She denies signs of infection. [name], [name] ([id number])  [id number] Page 7 of 11 3/30; patient presents for follow-up. She continues to use Keystone antibiotics with Hydrofera Blue under compression with no issues. Home health continues to change the dressings. 4/13 2-week follow-up. Keystone and Hydrofera Blue wound is roughly 2.3 cm in diameter smaller. No other issues 4/20; patient presents for follow-up. She continues to use Keystone antibiotic with [facility] with no issues. 4/28; patient with a difficult wound on her right medial lower leg. Using [facility] Blue minimal improvement per our intake nurse we are using compression. She has home health changing the dressing 5/5; patient presents for follow-up. We have been using Keystone antibiotics and Hydrofera Blue under compression therapy. She has no issues or complaints today. We discussed potentially using an advanced tissue product. She states she has had this done in the past and would like to proceed to see what her insurance would cover. 5/15; patient presents for follow-up. She has been using Keystone antibiotics and Hydrofera Blue under compression therapy. She forgot her Keystone antibiotics today. Insurance has not approved her for skin substitute. She currently denies signs of infection. She has home health that changes the dressing once a week. 5/19; patient presents for follow-up. We have been using Keystone antibiotic and Hydrofera Blue under compression therapy. She has no issues or complaints today. The [employer] rep confirmed over the phone that the patient has been 100% approved for their skin substitute. We will verify this as we have not received any paperwork stating this. Patient states she would like to proceed with a skin substitute if it is covered by insurance. 6/1; patient presents for follow-up. We have been using Keystone antibiotic and Hydrofera Blue under compression therapy. We confirmed that she does have a co-pay for the  [employer] skin substitute. We will hold off on using this for now since she continues to show improvement with current therapy. She denies signs of infection. She has home health that comes out twice weekly. 6/16; patient presents for follow-up. We have been using Keystone antibiotic and Hydrofera Blue under compression therapy. She has no issues or complaints today. We discussed the potential trial of vendaje. This is placenta tissue. She states she would like to try the free trial. We will reach out to our rep. 6/30; patient presents for follow-up. She has been using Keystone antibiotic and Hydrofera Blue under compression therapy. She has been approved for a trial of vendaje. She would like to proceed with this and We will have this available at next clinic visit. She has no issues or complaints today. 7/18; patient presents for follow-up. She has been using Keystone antibiotic and Hydrofera Blue under compression. She has no issues or  complaints today. We have a free sample of vendaje skin substitute and patient is agreeable to having this placed today. 7/24; patient presents for follow-up. We placed the first free trial of vendaje last week under compression wrap. Patient had no issues or complaints today. She tolerated this well. 8/1; patient presents for follow-up. Vandaje #2 donated product was placed in standard fashion last week under compression therapy. She has no issues or complaints today. 8/8; patient presents for follow-up. Vendaje #3 donated skin set was placed in standard fashion last visit under compression therapy. She has no issues or complaints today. 8/15; Patient presents for follow-up. Vandaje #4 was placed in standard fashion last week under compression therapy. She has no issues or complaints today. 8/22; patient presents for follow-up. Vandaje #5 was placed in standard fashion last week under compression therapy. She tolerated this well. 8/29; patient presents for follow-up.  We have been using Xeroform under compression therapy and she has tolerated this well. She has no issues or complaints today. She has home health that is been coming out weekly For dressing changes. 9/12; patient presents for follow-up. We have been using Xeroform under compression therapy. Home health has been changing the dressings. She has no issues or complaints today. 9/19; patient presents for follow-up. We have been using Xeroform under compression therapy. She has no issues or complaints today. 9/26; patient presents for follow-up. We have been using Xeroform under compression therapy. She has no issues or complaints today. Admission 08/26/2022 [name] is a 88 year old female that was recently discharged from our clinic last week due to a closed wound to the left lower extremity. Unfortunately she has developed a new wound to the right lower extremity. It appears that this is from trauma. 10/13; patient presents for follow-up. She tolerated the compression wrap well. She has no issues or complaints today. We have been using Santyl and Hydrofera Blue under the wrap. Patient History Information obtained from Patient. Family History Cancer - Child, Hypertension - Child,Father, Kidney Disease - Child, Stroke - Siblings,Child, Thyroid Problems - Child, No family history of Diabetes, Heart Disease, Hereditary Spherocytosis, Lung Disease, Seizures, Tuberculosis. Social History Never smoker, Marital Status - Widowed, Alcohol Use - Never, Drug Use - No History, Caffeine Use - Never. Medical History Eyes Denies history of Cataracts, Glaucoma, Optic Neuritis Ear/Nose/Mouth/Throat Denies history of Chronic sinus problems/congestion, Middle ear problems Hematologic/Lymphatic Patient has history of Anemia Denies history of Hemophilia, Human Immunodeficiency Virus, Lymphedema, Sickle Cell Disease Respiratory Denies history of Aspiration, Asthma, Chronic Obstructive Pulmonary Disease  (COPD), Pneumothorax, Sleep Apnea, Tuberculosis Cardiovascular Patient has history of Congestive Heart Failure, Hypertension, Peripheral Venous Disease Denies history of Angina, Arrhythmia, Coronary Artery Disease, Hypotension, Myocardial Infarction, Peripheral Arterial Disease, Phlebitis, Vasculitis Gastrointestinal Denies history of Cirrhosis , Colitis, Crohnoos, Hepatitis A, Hepatitis B, Hepatitis C Endocrine SENSENEY, Corayma ([id number]) [id number] Page 8 of 11 Denies history of Type I Diabetes, Type II Diabetes Genitourinary Denies history of End Stage Renal Disease Immunological Denies history of Lupus Erythematosus, Raynaudoos, Scleroderma Integumentary (Skin) Denies history of History of Burn Musculoskeletal Patient has history of Osteoarthritis Denies history of Gout, Rheumatoid Arthritis, Osteomyelitis Neurologic Denies history of Neuropathy, Quadriplegia, Paraplegia, Seizure Disorder Oncologic Patient has history of Received Chemotherapy - Breast and Ovarian Cancer Denies history of Received Radiation Hospitalization/Surgery History - diverticulits. Objective Constitutional respirations regular, non-labored and within target range for patient.. Vitals Time Taken: 10:37 AM, Height: 66 in, Weight: 176 lbs, BMI: 28.4, Temperature: 97.9 F, Pulse: 65 bpm, Respiratory  Rate: 18 breaths/min, Blood Pressure: 146/87 mmHg. Cardiovascular 2+ dorsalis pedis/posterior tibialis pulses. Psychiatric pleasant and cooperative. General Notes: Right lower extremity: T the anterior aspect there is an open wound with granulation tissue and nonviable tissue. o Integumentary (Hair, Skin) Wound #27 status is Open. Original cause of wound was Gradually Appeared. The date acquired was: 08/23/2022. The wound has been in treatment 1 weeks. The wound is located on the Right,Anterior Lower Leg. The wound measures 1.5cm length x 0.5cm width x 0.2cm depth; 0.589cm^2  area and 0.118cm^3 volume. There is Fat Layer (Subcutaneous Tissue) exposed. There is no tunneling or undermining noted. There is a medium amount of serosanguineous drainage noted. The wound margin is distinct with the outline attached to the wound base. There is medium (34-66%) red, pink granulation within the wound bed. There is a medium (34-66%) amount of necrotic tissue within the wound bed including Adherent Slough. The periwound skin appearance did not exhibit: Callus, Crepitus, Excoriation, Induration, Rash, Scarring, Dry/Scaly, Maceration, Atrophie Blanche, Cyanosis, Ecchymosis, Hemosiderin Staining, Mottled, Pallor, Rubor, Erythema. Periwound temperature was noted as No Abnormality. The periwound has tenderness on palpation. Assessment Active Problems ICD-10 Unspecified open wound, right lower leg, initial encounter Chronic venous hypertension (idiopathic) with ulcer of right lower extremity Malignant neoplasm of unspecified site of unspecified female breast Chronic combined systolic (congestive) and diastolic (congestive) heart failure Alzheimer's disease, unspecified Malignant neoplasm of endometrium Patient's wound has shown improvement in size and appearance since last clinic visit. I debrided nonviable tissue. I recommended continuing the course with Santyl and Hydrofera Blue under 3 layer compression. Follow-up in 1 week. Procedures Wound #27 Pre-procedure diagnosis of Wound #27 is a Venous Leg Ulcer located on the Right,Anterior Lower Leg .Severity of Tissue Pre Debridement is: Fat layer exposed. There was a Excisional Skin/Subcutaneous Tissue Debridement with a total area of 0.75 sq cm performed by Kalman Shan, DO. With the following instrument(s): Curette to remove Viable and Non-Viable tissue/material. Material removed includes Subcutaneous Tissue and Slough and after achieving pain control using Lidocaine. No specimens were taken. A time out was conducted at 11:00,  prior to the start of the procedure. A Minimum amount of bleeding was controlled with Pressure. The procedure was tolerated well with a pain level of 0 throughout and a pain level of 0 following the procedure. Post Debridement Measurements: 1.5cm length x 0.5cm width x 0.2cm depth; 0.118cm^3 volume. [name], GREENAWALT ([id number]) [id number] Page 9 of 11 Character of Wound/Ulcer Post Debridement is improved. Severity of Tissue Post Debridement is: Fat layer exposed. Post procedure Diagnosis Wound #27: Same as Pre-Procedure Pre-procedure diagnosis of Wound #27 is a Venous Leg Ulcer located on the Right,Anterior Lower Leg . There was a Three Layer Compression Therapy Procedure by Rhae Hammock, RN. Post procedure Diagnosis Wound #27: Same as Pre-Procedure Plan Follow-up Appointments: Return Appointment in 1 week. - w/ Dr. Heber Arbutus and Allayne Butcher Rm # 9 Anesthetic: (In clinic) Topical Lidocaine 5% applied to wound bed Bathing/ Shower/ Hygiene: May shower with protection but do not get wound dressing(s) wet. - use cast protector. May shower and wash wound with soap and water. - with dressing changes may wash with soap and water. Edema Control - Lymphedema / SCD / Other: Elevate legs to the level of the heart or above for 30 minutes daily and/or when sitting, a frequency of: Avoid standing for long periods of time. Patient to wear own compression stockings every day. - apply in the morning and remove at night to right leg. Exercise regularly  Moisturize legs daily. - every night before day right leg. Additional Orders / Instructions: Follow Nutritious Diet - High Protein Diet WOUND #27: - Lower Leg Wound Laterality: Right, Anterior Cleanser: Soap and Water 1 x Per Week/7 Days Discharge Instructions: May shower and wash wound with dial antibacterial soap and water prior to dressing change. Prim Dressing: Hydrofera Blue Ready Foam, 2.5 x2.5 in 1 x Per Week/7  Days ary Discharge Instructions: Apply to wound bed as instructed Prim Dressing: Santyl Ointment 1 x Per Week/7 Days ary Discharge Instructions: Apply nickel thick amount to wound bed as instructed Secondary Dressing: Woven Gauze Sponge, Non-Sterile 4x4 in 1 x Per Week/7 Days Discharge Instructions: Apply over primary dressing as directed. Com pression Wrap: ThreePress (3 layer compression wrap) 1 x Per Week/7 Days Discharge Instructions: Apply three layer compression as directed. 1. In office sharp debridement 2. Santyl and Hydrofera Blue under 3 layer compression 3. Follow-up in 1 week Electronic Signature(s) Signed: 09/03/2022 12:13:57 PM By: Kalman Shan DO Entered By: Kalman Shan on 09/03/2022 11:10:25 -------------------------------------------------------------------------------- HxROS Details Patient Name: Date of Service: MARCELA, ALATORRE [name] 09/03/2022 10:00 A M Medical Record Number: [id number] Patient Account Number: [id number] Date of Birth/Sex: Treating RN: October 16, 1925 (88 y.o. [name], [name]: Marda Stalker Other Clinician: Referring Provider: Treating Provider/Extender: Aviva Signs in Treatment: 44 Information Obtained From Patient Eyes Medical History: Negative for: Cataracts; Glaucoma; Optic Neuritis Ear/Nose/Mouth/Throat Medical History: Negative for: Chronic sinus problems/congestion; Middle ear problems SEVYN, MARKHAM ([id number]) [id number] Page 10 of 11 Hematologic/Lymphatic Medical History: Positive for: Anemia Negative for: Hemophilia; Human Immunodeficiency Virus; Lymphedema; Sickle Cell Disease Respiratory Medical History: Negative for: Aspiration; Asthma; Chronic Obstructive Pulmonary Disease (COPD); Pneumothorax; Sleep Apnea; Tuberculosis Cardiovascular Medical History: Positive for: Congestive Heart Failure; Hypertension; Peripheral Venous  Disease Negative for: Angina; Arrhythmia; Coronary Artery Disease; Hypotension; Myocardial Infarction; Peripheral Arterial Disease; Phlebitis; Vasculitis Gastrointestinal Medical History: Negative for: Cirrhosis ; Colitis; Crohns; Hepatitis A; Hepatitis B; Hepatitis C Endocrine Medical History: Negative for: Type I Diabetes; Type II Diabetes Genitourinary Medical History: Negative for: End Stage Renal Disease Immunological Medical History: Negative for: Lupus Erythematosus; Raynauds; Scleroderma Integumentary (Skin) Medical History: Negative for: History of Burn Musculoskeletal Medical History: Positive for: Osteoarthritis Negative for: Gout; Rheumatoid Arthritis; Osteomyelitis Neurologic Medical History: Negative for: Neuropathy; Quadriplegia; Paraplegia; Seizure Disorder Oncologic Medical History: Positive for: Received Chemotherapy - Breast and Ovarian Cancer Negative for: Received Radiation Immunizations Pneumococcal Vaccine: Received Pneumococcal Vaccination: Yes Received Pneumococcal Vaccination On or After 60th Birthday: Yes Implantable Devices None Hospitalization / Surgery History Type of Hospitalization/Surgery diverticulits Family and Social History Cancer: Yes - Child; Diabetes: No; Heart Disease: No; Hereditary Spherocytosis: No; Hypertension: Yes - Child,Father; Kidney Disease: Yes - Child; Lung Disease: No; Seizures: No; Stroke: Yes - Siblings,Child; Thyroid Problems: Yes - Child; Tuberculosis: No; Never smoker; Marital Status - Widowed; Alcohol Use: Never; Drug Use: No History; Caffeine Use: Never; Financial Concerns: No; Food, Clothing or Shelter Needs: No; Support System Lacking: No; Transportation Concerns: No LUCIA, HARM ([id number]) [id number] Page 11 of 11 Electronic Signature(s) Signed: 09/03/2022 12:13:57 PM By: Kalman Shan DO Signed: 09/09/2022 4:05:37 PM By: [name] Entered By: Kalman Shan on 09/03/2022 11:09:19 -------------------------------------------------------------------------------- SuperBill Details Patient Name: Date of Service: NALEE, LIGHTLE [name] 09/03/2022 Medical Record Number: [id number] Patient Account Number: [id number] Date of Birth/Sex: Treating RN: 1925/02/05 (88 y.o. [name], [name]: Marda Stalker Other Clinician: Referring Provider: Treating Provider/Extender: Marjo Bicker Weeks in Treatment: 40 Diagnosis  Coding ICD-10 Codes Code Description P03.403T Unspecified open wound, right lower leg, initial encounter I87.311 Chronic venous hypertension (idiopathic) with ulcer of right lower extremity C50.919 Malignant neoplasm of unspecified site of unspecified female breast I50.42 Chronic combined systolic (congestive) and diastolic (congestive) heart failure G30.9 Alzheimer's disease, unspecified C54.1 Malignant neoplasm of endometrium Facility Procedures : CPT4 Code: [id number] Description: 93112 - DEBRIDE WOUND 1ST 20 SQ CM OR < ICD-10 Diagnosis Description I87.311 Chronic venous hypertension (idiopathic) with ulcer of right lower extremity S81.801A Unspecified open wound, right lower leg, initial encounter Modifier: Quantity: 1 Physician Procedures : CPT4 Code Description Modifier [id number] - WC PHYS DEBR WO ANESTH 20 SQ CM ICD-10 Diagnosis Description I87.311 Chronic venous hypertension (idiopathic) with ulcer of right lower extremity S81.801A Unspecified open wound, right lower leg, initial  encounter Quantity: 1 Electronic Signature(s) Signed: 09/03/2022 12:13:57 PM By: Kalman Shan DO Entered By: Kalman Shan on 09/03/2022 11:10:49

## 2022-09-10 ENCOUNTER — Encounter (HOSPITAL_BASED_OUTPATIENT_CLINIC_OR_DEPARTMENT_OTHER): Payer: Medicare PPO | Admitting: Internal Medicine

## 2022-09-10 DIAGNOSIS — G309 Alzheimer's disease, unspecified: Secondary | ICD-10-CM

## 2022-09-10 DIAGNOSIS — C541 Malignant neoplasm of endometrium: Secondary | ICD-10-CM | POA: Diagnosis not present

## 2022-09-10 DIAGNOSIS — L97812 Non-pressure chronic ulcer of other part of right lower leg with fat layer exposed: Secondary | ICD-10-CM | POA: Diagnosis not present

## 2022-09-10 DIAGNOSIS — I87311 Chronic venous hypertension (idiopathic) with ulcer of right lower extremity: Secondary | ICD-10-CM | POA: Diagnosis not present

## 2022-09-10 DIAGNOSIS — I5042 Chronic combined systolic (congestive) and diastolic (congestive) heart failure: Secondary | ICD-10-CM | POA: Diagnosis not present

## 2022-09-10 DIAGNOSIS — S81801A Unspecified open wound, right lower leg, initial encounter: Secondary | ICD-10-CM | POA: Diagnosis not present

## 2022-09-10 DIAGNOSIS — I110 Hypertensive heart disease with heart failure: Secondary | ICD-10-CM | POA: Diagnosis not present

## 2022-09-10 DIAGNOSIS — F0280 Dementia in other diseases classified elsewhere without behavioral disturbance: Secondary | ICD-10-CM | POA: Diagnosis not present

## 2022-09-10 DIAGNOSIS — C50919 Malignant neoplasm of unspecified site of unspecified female breast: Secondary | ICD-10-CM | POA: Diagnosis not present

## 2022-09-16 DIAGNOSIS — Z23 Encounter for immunization: Secondary | ICD-10-CM | POA: Diagnosis not present

## 2022-09-16 NOTE — Progress Notes (Signed)
CLEOLA, PERRYMAN ([id number]) [id number] Page 1 of 10 Visit Report for 09/10/2022 Chief Complaint Document Details Patient Name: Date of Service: DORENE, BRUNI 09/10/2022 10:45 A M Medical Record Number: [id number] Patient Account Number: [id number] Date of Birth/Sex: Treating RN: May 08, 1925 (88 y.o. F) Primary Care Provider: Marda Stalker Other Clinician: Referring Provider: Treating Provider/Extender: Aviva Signs in Treatment: 10 Information Obtained from: Patient Chief Complaint 11/27/2021; Left lower extremity wound 08/26/2022; right lower extremity wound Electronic Signature(s) Signed: 09/10/2022 11:55:19 AM By: Kalman Shan DO Entered By: Kalman Shan on 09/10/2022 11:50:17 -------------------------------------------------------------------------------- HPI Details Patient Name: Date of Service: WILLEAN, SCHURMAN RNESTINE 09/10/2022 10:45 A M Medical Record Number: [id number] Patient Account Number: [id number] Date of Birth/Sex: Treating RN: January 01, 1925 (88 y.o. F) Primary Care Provider: Marda Stalker Other Clinician: Referring Provider: Treating Provider/Extender: Aviva Signs in Treatment: 39 History of Present Illness HPI Description: this is a patient we know from several prior wounds on her bilateral lower extremities. She has venous stasis physiology, inflammation and hypertension. She is been fully evaluated for venous ablation and was found not to be a candidate. She does not have significant arterial disease. 02/14/15 the wound itself on her lateral left leg did not look too bad however there was surrounding maceration which was concerning 02/27/15 the wound itself is small with surrounding circumferential epithelialization there is no surrounding maceration. 03/06/15 only a very small area remains. I think this is mostly epithelialized however I would be uncomfortable not  dressing this this week 03/13/15 the area is epithelialized. There was a thick surface on this. I took a #15 blade then lightly disrupted it but there does not appear to be any open area I did not see anything but appropriate epithelium Readmission: 08/16/18 on evaluation today patient presents for initial evaluation and our clinic released readmission although she has not been seen since April 2016. Nonetheless she is having issues with the ulcers currently on the bilateral lateral malleolus or locations as well as the left lateral lower extremity. These have been present for roughly 3 weeks since the patient actually developed significant bilateral lower extremity edema which patient's daughter states was roughly 3 times the size of her legs currently. Subsequently she ended up in the emerging department due to congestive heart failure. They were able to get the swelling under control and fortunately she seems to be doing much better at this point in time. She was placed in an [employer] which she sat on for week part evaluation today as well. Fortunately the wound areas actually seem to be doing fairly well there is some macerated skin surrounding there are the areas/surface of the wound which are gonna require some debridement at this point. No fevers, chills, nausea, or vomiting noted at this time. Patient has a history of hypertension, mild peripheral vascular disease, chronic venous stasis, and other than the ulcer seems to be doing fairly well today. No fevers, chills, nausea, or vomiting noted at this time. 08/23/18 on evaluation today patient actually appears to be doing great in regard to her bilateral lower should be ulcers. She's shown signs of improvement even just with one week with the [employer]. In general I'm actually very pleased with how things appear at this point. 09/06/18 on evaluation today patient actually appears to be doing much better her left lower extremity is  completely healed. Go to the right lower extremity lateral malleolus ulcer this still seems to be showing signs of being open.  There does not appear to be evidence of infection which is good news. This did require some sharp debridement however to remove some of the necrotic tissue/eschar on the surface of the wound Readmission 11/27/2021 Ms. Baneen Wieseler is a 88 year old female with a past medical history of endometrial and breast cancer, chronic venous insufficiency, dementia and essential hypertension that presents to the clinic with a 1 month history of nonhealing ulcer to the left lower extremity. She states that the ulcer opened 1 Knauer, Ori ([id number]) [id number] Page 2 of 10 month ago and then healed but has reopened again in the past week. She is currently keeping the area covered. She has a history of wounds to her lower extremities. She has compression stockings that she uses daily. She currently denies signs of infection. 1/13; patient presents for follow-up. She followed up for nurse visit for wrap change. She reports more odor. She denies pain. 1/20; patient presents for follow-up. She has been using gentamicin ointment with calcium alginate to the wound bed. She reports improvement in odor and drainage. She currently denies systemic signs of infection. 1/27; patient presents for follow-up. She has been using gentamicin ointment with calcium alginate. She received her Keystone antibiotic 2 days ago. She brought this in. Home health has also been established. They will come out for the first time next week. She currently denies signs of infection. She has no issues or complaints today. 2/2; patient presents for follow-up. She has been using Keystone antibiotics with [facility] with no issues. She denies signs of infection. She has no issues or complaints today. 2/9; patient presents for follow-up. She has been using Keystone antibiotics with  [facility] with no issues. She has tolerated the compression wrap well. She denies signs of infection. 2/16; patient presents for follow-up. She has been using Keystone antibiotics with Hydrofera Blue under compression wrap with no issues. She currently denies signs of infection. 2/23; patient presents for follow-up. She continues to use Keystone antibiotics with Hydrofera Blue under the compression wrap. She has no issues or complaints today. She denies signs of infection. Home health continues to come out and change the wrap. 3/2; patient presents for follow-up. She continues to use [facility] with [facility] under compression with no issues. She denies signs of infection. 3/16; patient presents for follow-up. She continues to use [facility] with [facility] under compression with no issues. Home health change the dressing last week. She denies signs of infection. 3/23; patient presents for follow-up. She continues to use Keystone antibiotics with Hydrofera Blue under compression with no issues. Home health continues to change the dressings. She denies signs of infection. 3/30; patient presents for follow-up. She continues to use Keystone antibiotics with Hydrofera Blue under compression with no issues. Home health continues to change the dressings. 4/13 2-week follow-up. Keystone and Hydrofera Blue wound is roughly 2.3 cm in diameter smaller. No other issues 4/20; patient presents for follow-up. She continues to use Keystone antibiotic with [facility] with no issues. 4/28; patient with a difficult wound on her right medial lower leg. Using [facility] Blue minimal improvement per our intake nurse we are using compression. She has home health changing the dressing 5/5; patient presents for follow-up. We have been using Keystone antibiotics and Hydrofera Blue under compression therapy. She has no issues or complaints today. We discussed potentially using an advanced tissue  product. She states she has had this done in the past and would like to proceed to see what her  insurance would cover. 5/15; patient presents for follow-up. She has been using Keystone antibiotics and Hydrofera Blue under compression therapy. She forgot her Keystone antibiotics today. Insurance has not approved her for skin substitute. She currently denies signs of infection. She has home health that changes the dressing once a week. 5/19; patient presents for follow-up. We have been using Keystone antibiotic and Hydrofera Blue under compression therapy. She has no issues or complaints today. The [employer] rep confirmed over the phone that the patient has been 100% approved for their skin substitute. We will verify this as we have not received any paperwork stating this. Patient states she would like to proceed with a skin substitute if it is covered by insurance. 6/1; patient presents for follow-up. We have been using Keystone antibiotic and Hydrofera Blue under compression therapy. We confirmed that she does have a co-pay for the [employer] skin substitute. We will hold off on using this for now since she continues to show improvement with current therapy. She denies signs of infection. She has home health that comes out twice weekly. 6/16; patient presents for follow-up. We have been using Keystone antibiotic and Hydrofera Blue under compression therapy. She has no issues or complaints today. We discussed the potential trial of vendaje. This is placenta tissue. She states she would like to try the free trial. We will reach out to our rep. 6/30; patient presents for follow-up. She has been using Keystone antibiotic and Hydrofera Blue under compression therapy. She has been approved for a trial of vendaje. She would like to proceed with this and We will have this available at next clinic visit. She has no issues or complaints today. 7/18; patient presents for follow-up. She has been using Keystone  antibiotic and Hydrofera Blue under compression. She has no issues or complaints today. We have a free sample of vendaje skin substitute and patient is agreeable to having this placed today. 7/24; patient presents for follow-up. We placed the first free trial of vendaje last week under compression wrap. Patient had no issues or complaints today. She tolerated this well. 8/1; patient presents for follow-up. Vandaje #2 donated product was placed in standard fashion last week under compression therapy. She has no issues or complaints today. 8/8; patient presents for follow-up. Vendaje #3 donated skin set was placed in standard fashion last visit under compression therapy. She has no issues or complaints today. 8/15; Patient presents for follow-up. Vandaje #4 was placed in standard fashion last week under compression therapy. She has no issues or complaints today. 8/22; patient presents for follow-up. Vandaje #5 was placed in standard fashion last week under compression therapy. She tolerated this well. 8/29; patient presents for follow-up. We have been using Xeroform under compression therapy and she has tolerated this well. She has no issues or complaints today. She has home health that is been coming out weekly For dressing changes. 9/12; patient presents for follow-up. We have been using Xeroform under compression therapy. Home health has been changing the dressings. She has no issues or complaints today. 9/19; patient presents for follow-up. We have been using Xeroform under compression therapy. She has no issues or complaints today. 9/26; patient presents for follow-up. We have been using Xeroform under compression therapy. She has no issues or complaints today. Admission 08/26/2022 Ms. Galya Dunnigan is a 88 year old female that was recently discharged from our clinic last week due to a closed wound to the left lower extremity. SEVERINA, SYKORA ([id number])  [id number] Page 3 of 10 Unfortunately  she has developed a new wound to the right lower extremity. It appears that this is from trauma. 10/13; patient presents for follow-up. She tolerated the compression wrap well. She has no issues or complaints today. We have been using Santyl and Hydrofera Blue under the wrap. 10/20; patient presents for follow-up. We have been using Santyl and Hydrofera Blue under 3 layer compression. Her wound is healing nicely. She has no issues or complaints today. Electronic Signature(s) Signed: 09/10/2022 11:55:19 AM By: Kalman Shan DO Entered By: Kalman Shan on 09/10/2022 11:51:12 -------------------------------------------------------------------------------- Physical Exam Details Patient Name: Date of Service: SHAKESHA, SOLTAU RNESTINE 09/10/2022 10:45 A M Medical Record Number: [id number] Patient Account Number: [id number] Date of Birth/Sex: Treating RN: 04-09-1925 (88 y.o. F) Primary Care Provider: Marda Stalker Other Clinician: Referring Provider: Treating Provider/Extender: Aviva Signs in Treatment: 59 Constitutional respirations regular, non-labored and within target range for patient.. Cardiovascular 2+ dorsalis pedis/posterior tibialis pulses. Psychiatric pleasant and cooperative. Notes Right lower extremity: T the anterior aspect there is an open wound with granulation tissue throughout. No signs of infection. Good edema control. o Electronic Signature(s) Signed: 09/10/2022 11:55:19 AM By: Kalman Shan DO Entered By: Kalman Shan on 09/10/2022 11:52:04 -------------------------------------------------------------------------------- Physician Orders Details Patient Name: Date of Service: JULIAH, SCADDEN RNESTINE 09/10/2022 10:45 A M Medical Record Number: [id number] Patient Account Number: [id number] Date of Birth/Sex: Treating RN: [birth year] (88 y.o. [name], [name] Primary  Care Provider: Marda Stalker Other Clinician: Referring Provider: Treating Provider/Extender: Aviva Signs in Treatment: 66 Verbal / Phone Orders: No Diagnosis Coding Follow-up Appointments ppointment in 1 week. - w/ Dr. Heber [place] and Elias Else # 9 Return A Anesthetic (In clinic) Topical Lidocaine 5% applied to wound bed Bathing/ Shower/ Hygiene May shower with protection but do not get wound dressing(s) wet. - use cast protector. May shower and wash wound with soap and water. - with dressing changes may wash with soap and water. LADAVIA, LINDENBAUM ([id number]) [id number] Page 4 of 10 Edema Control - Lymphedema / SCD / Other Elevate legs to the level of the heart or above for 30 minutes daily and/or when sitting, a frequency of: Avoid standing for long periods of time. Patient to wear own compression stockings every day. - apply in the morning and remove at night to right leg. Exercise regularly Moisturize legs daily. - every night before day right leg. Additional Orders / Instructions Follow Nutritious Diet - High Protein Diet Wound Treatment Wound #27 - Lower Leg Wound Laterality: Right, Anterior Cleanser: Soap and Water 1 x Per Week/7 Days Discharge Instructions: May shower and wash wound with dial antibacterial soap and water prior to dressing change. Prim Dressing: Endoform 2x2 in 1 x Per Week/7 Days ary Discharge Instructions: Moisten with saline Secondary Dressing: Woven Gauze Sponge, Non-Sterile 4x4 in 1 x Per Week/7 Days Discharge Instructions: Apply over primary dressing as directed. Compression Wrap: ThreePress (3 layer compression wrap) 1 x Per Week/7 Days Discharge Instructions: Apply three layer compression as directed. Electronic Signature(s) Signed: 09/10/2022 11:55:19 AM By: Kalman Shan DO Signed: 09/16/2022 4:44:37 PM By: [name] Entered By: Rhae Hammock on 09/10/2022  11:53:11 -------------------------------------------------------------------------------- Problem List Details Patient Name: Date of Service: DERICA, LEIBER RNESTINE 09/10/2022 10:45 A M Medical Record Number: [id number] Patient Account Number: [id number] Date of Birth/Sex: Treating RN: [birth year] (88 y.o. F) Primary Care Provider: Marda Stalker Other Clinician: Referring Provider: Treating Provider/Extender: Aviva Signs in Treatment: 76 Active Problems ICD-10 Encounter Code Description Active Date MDM  Diagnosis S81.801A Unspecified open wound, right lower leg, initial encounter 08/26/2022 No Yes I87.311 Chronic venous hypertension (idiopathic) with ulcer of right lower extremity 08/26/2022 No Yes C50.919 Malignant neoplasm of unspecified site of unspecified female breast 11/27/2021 No Yes I50.42 Chronic combined systolic (congestive) and diastolic (congestive) heart failure 11/27/2021 No Yes G30.9 Alzheimer's disease, unspecified 11/27/2021 No Yes C54.1 Malignant neoplasm of endometrium 11/27/2021 No Yes HEDAYA, LATENDRESSE ([id number]) [phone] Page 5 of 10 Inactive Problems Resolved Problems ICD-10 Code Description Active Date Resolved Date I87.312 Chronic venous hypertension (idiopathic) with ulcer of left lower extremity 11/27/2021 11/27/2021 F64.332 Non-pressure chronic ulcer of other part of left lower leg with fat layer exposed 11/27/2021 11/27/2021 Electronic Signature(s) Signed: 09/10/2022 11:55:19 AM By: Kalman Shan DO Entered By: Kalman Shan on 09/10/2022 11:49:53 -------------------------------------------------------------------------------- Progress Note Details Patient Name: Date of Service: JULIZZA, SASSONE RNESTINE 09/10/2022 10:45 A M Medical Record Number: [id number] Patient Account Number: [id number] Date of Birth/Sex: Treating RN: 1925/09/26 (88 y.o. F) Primary Care Provider: Marda Stalker Other  Clinician: Referring Provider: Treating Provider/Extender: Aviva Signs in Treatment: 45 Subjective Chief Complaint Information obtained from Patient 11/27/2021; Left lower extremity wound 08/26/2022; right lower extremity wound History of Present Illness (HPI) this is a patient we know from several prior wounds on her bilateral lower extremities. She has venous stasis physiology, inflammation and hypertension. She is been fully evaluated for venous ablation and was found not to be a candidate. She does not have significant arterial disease. 02/14/15 the wound itself on her lateral left leg did not look too bad however there was surrounding maceration which was concerning 02/27/15 the wound itself is small with surrounding circumferential epithelialization there is no surrounding maceration. 03/06/15 only a very small area remains. I think this is mostly epithelialized however I would be uncomfortable not dressing this this week 03/13/15 the area is epithelialized. There was a thick surface on this. I took a #15 blade then lightly disrupted it but there does not appear to be any open area I did not see anything but appropriate epithelium Readmission: 08/16/18 on evaluation today patient presents for initial evaluation and our clinic released readmission although she has not been seen since April 2016. Nonetheless she is having issues with the ulcers currently on the bilateral lateral malleolus or locations as well as the left lateral lower extremity. These have been present for roughly 3 weeks since the patient actually developed significant bilateral lower extremity edema which patient's daughter states was roughly 3 times the size of her legs currently. Subsequently she ended up in the emerging department due to congestive heart failure. They were able to get the swelling under control and fortunately she seems to be doing much better at this point in time. She was placed  in an [employer] which she sat on for week part evaluation today as well. Fortunately the wound areas actually seem to be doing fairly well there is some macerated skin surrounding there are the areas/surface of the wound which are gonna require some debridement at this point. No fevers, chills, nausea, or vomiting noted at this time. Patient has a history of hypertension, mild peripheral vascular disease, chronic venous stasis, and other than the ulcer seems to be doing fairly well today. No fevers, chills, nausea, or vomiting noted at this time. 08/23/18 on evaluation today patient actually appears to be doing great in regard to her bilateral lower should be ulcers. She's shown signs of improvement even just with one week with the  [employer] wraps. In general I'm actually very pleased with how things appear at this point. 09/06/18 on evaluation today patient actually appears to be doing much better her left lower extremity is completely healed. Go to the right lower extremity lateral malleolus ulcer this still seems to be showing signs of being open. There does not appear to be evidence of infection which is good news. This did require some sharp debridement however to remove some of the necrotic tissue/eschar on the surface of the wound Readmission 11/27/2021 Ms. Aarianna Hoadley is a 88 year old female with a past medical history of endometrial and breast cancer, chronic venous insufficiency, dementia and essential hypertension that presents to the clinic with a 1 month history of nonhealing ulcer to the left lower extremity. She states that the ulcer opened 1 month ago and then healed but has reopened again in the past week. She is currently keeping the area covered. She has a history of wounds to her lower extremities. She has compression stockings that she uses daily. She currently denies signs of infection. 1/13; patient presents for follow-up. She followed up for nurse visit for wrap change.  She reports more odor. She denies pain. 1/20; patient presents for follow-up. She has been using gentamicin ointment with calcium alginate to the wound bed. She reports improvement in odor and drainage. She currently denies systemic signs of infection. BIRTTANY, DECHELLIS ([id number]) [id number] Page 6 of 10 1/27; patient presents for follow-up. She has been using gentamicin ointment with calcium alginate. She received her Keystone antibiotic 2 days ago. She brought this in. Home health has also been established. They will come out for the first time next week. She currently denies signs of infection. She has no issues or complaints today. 2/2; patient presents for follow-up. She has been using Keystone antibiotics with [facility] with no issues. She denies signs of infection. She has no issues or complaints today. 2/9; patient presents for follow-up. She has been using Keystone antibiotics with [facility] with no issues. She has tolerated the compression wrap well. She denies signs of infection. 2/16; patient presents for follow-up. She has been using Keystone antibiotics with Hydrofera Blue under compression wrap with no issues. She currently denies signs of infection. 2/23; patient presents for follow-up. She continues to use Keystone antibiotics with Hydrofera Blue under the compression wrap. She has no issues or complaints today. She denies signs of infection. Home health continues to come out and change the wrap. 3/2; patient presents for follow-up. She continues to use [facility] with [facility] under compression with no issues. She denies signs of infection. 3/16; patient presents for follow-up. She continues to use [facility] with [facility] under compression with no issues. Home health change the dressing last week. She denies signs of infection. 3/23; patient presents for follow-up. She continues to use Keystone antibiotics with Hydrofera Blue under  compression with no issues. Home health continues to change the dressings. She denies signs of infection. 3/30; patient presents for follow-up. She continues to use Keystone antibiotics with Hydrofera Blue under compression with no issues. Home health continues to change the dressings. 4/13 2-week follow-up. Keystone and Hydrofera Blue wound is roughly 2.3 cm in diameter smaller. No other issues 4/20; patient presents for follow-up. She continues to use Keystone antibiotic with [facility] with no issues. 4/28; patient with a difficult wound on her right medial lower leg. Using [facility] Blue minimal improvement per our intake nurse we are using compression. She has home health  changing the dressing 5/5; patient presents for follow-up. We have been using Keystone antibiotics and Hydrofera Blue under compression therapy. She has no issues or complaints today. We discussed potentially using an advanced tissue product. She states she has had this done in the past and would like to proceed to see what her insurance would cover. 5/15; patient presents for follow-up. She has been using Keystone antibiotics and Hydrofera Blue under compression therapy. She forgot her Keystone antibiotics today. Insurance has not approved her for skin substitute. She currently denies signs of infection. She has home health that changes the dressing once a week. 5/19; patient presents for follow-up. We have been using Keystone antibiotic and Hydrofera Blue under compression therapy. She has no issues or complaints today. The [employer] rep confirmed over the phone that the patient has been 100% approved for their skin substitute. We will verify this as we have not received any paperwork stating this. Patient states she would like to proceed with a skin substitute if it is covered by insurance. 6/1; patient presents for follow-up. We have been using Keystone antibiotic and Hydrofera Blue under compression  therapy. We confirmed that she does have a co-pay for the [employer] skin substitute. We will hold off on using this for now since she continues to show improvement with current therapy. She denies signs of infection. She has home health that comes out twice weekly. 6/16; patient presents for follow-up. We have been using Keystone antibiotic and Hydrofera Blue under compression therapy. She has no issues or complaints today. We discussed the potential trial of vendaje. This is placenta tissue. She states she would like to try the free trial. We will reach out to our rep. 6/30; patient presents for follow-up. She has been using Keystone antibiotic and Hydrofera Blue under compression therapy. She has been approved for a trial of vendaje. She would like to proceed with this and We will have this available at next clinic visit. She has no issues or complaints today. 7/18; patient presents for follow-up. She has been using Keystone antibiotic and Hydrofera Blue under compression. She has no issues or complaints today. We have a free sample of vendaje skin substitute and patient is agreeable to having this placed today. 7/24; patient presents for follow-up. We placed the first free trial of vendaje last week under compression wrap. Patient had no issues or complaints today. She tolerated this well. 8/1; patient presents for follow-up. Vandaje #2 donated product was placed in standard fashion last week under compression therapy. She has no issues or complaints today. 8/8; patient presents for follow-up. Vendaje #3 donated skin set was placed in standard fashion last visit under compression therapy. She has no issues or complaints today. 8/15; Patient presents for follow-up. Vandaje #4 was placed in standard fashion last week under compression therapy. She has no issues or complaints today. 8/22; patient presents for follow-up. Vandaje #5 was placed in standard fashion last week under compression therapy. She  tolerated this well. 8/29; patient presents for follow-up. We have been using Xeroform under compression therapy and she has tolerated this well. She has no issues or complaints today. She has home health that is been coming out weekly For dressing changes. 9/12; patient presents for follow-up. We have been using Xeroform under compression therapy. Home health has been changing the dressings. She has no issues or complaints today. 9/19; patient presents for follow-up. We have been using Xeroform under compression therapy. She has no issues or complaints today. 9/26; patient presents  for follow-up. We have been using Xeroform under compression therapy. She has no issues or complaints today. Admission 08/26/2022 Ms. Arelys Glassco is a 88 year old female that was recently discharged from our clinic last week due to a closed wound to the left lower extremity. Unfortunately she has developed a new wound to the right lower extremity. It appears that this is from trauma. 10/13; patient presents for follow-up. She tolerated the compression wrap well. She has no issues or complaints today. We have been using Santyl and Hydrofera Blue under the wrap. 10/20; patient presents for follow-up. We have been using Santyl and Hydrofera Blue under 3 layer compression. Her wound is healing nicely. She has no issues or complaints today. WRENN, WILLCOX ([id number]) [id number] Page 7 of 10 Patient History Information obtained from Patient. Family History Cancer - Child, Hypertension - Child,Father, Kidney Disease - Child, Stroke - Siblings,Child, Thyroid Problems - Child, No family history of Diabetes, Heart Disease, Hereditary Spherocytosis, Lung Disease, Seizures, Tuberculosis. Social History Never smoker, Marital Status - Widowed, Alcohol Use - Never, Drug Use - No History, Caffeine Use - Never. Medical History Eyes Denies history of Cataracts, Glaucoma, Optic  Neuritis Ear/Nose/Mouth/Throat Denies history of Chronic sinus problems/congestion, Middle ear problems Hematologic/Lymphatic Patient has history of Anemia Denies history of Hemophilia, Human Immunodeficiency Virus, Lymphedema, Sickle Cell Disease Respiratory Denies history of Aspiration, Asthma, Chronic Obstructive Pulmonary Disease (COPD), Pneumothorax, Sleep Apnea, Tuberculosis Cardiovascular Patient has history of Congestive Heart Failure, Hypertension, Peripheral Venous Disease Denies history of Angina, Arrhythmia, Coronary Artery Disease, Hypotension, Myocardial Infarction, Peripheral Arterial Disease, Phlebitis, Vasculitis Gastrointestinal Denies history of Cirrhosis , Colitis, Crohnoos, Hepatitis A, Hepatitis B, Hepatitis C Endocrine Denies history of Type I Diabetes, Type II Diabetes Genitourinary Denies history of End Stage Renal Disease Immunological Denies history of Lupus Erythematosus, Raynaudoos, Scleroderma Integumentary (Skin) Denies history of History of Burn Musculoskeletal Patient has history of Osteoarthritis Denies history of Gout, Rheumatoid Arthritis, Osteomyelitis Neurologic Denies history of Neuropathy, Quadriplegia, Paraplegia, Seizure Disorder Oncologic Patient has history of Received Chemotherapy - Breast and Ovarian Cancer Denies history of Received Radiation Hospitalization/Surgery History - diverticulits. Objective Constitutional respirations regular, non-labored and within target range for patient.. Vitals Time Taken: 10:55 AM, Height: 66 in, Weight: 176 lbs, BMI: 28.4, Temperature: 97.9 F, Pulse: 57 bpm, Respiratory Rate: 18 breaths/min, Blood Pressure: 168/70 mmHg. Cardiovascular 2+ dorsalis pedis/posterior tibialis pulses. Psychiatric pleasant and cooperative. General Notes: Right lower extremity: T the anterior aspect there is an open wound with granulation tissue throughout. No signs of infection. Good edema o control. Integumentary  (Hair, Skin) Wound #27 status is Open. Original cause of wound was Gradually Appeared. The date acquired was: 08/23/2022. The wound has been in treatment 2 weeks. The wound is located on the Right,Anterior Lower Leg. The wound measures 0.5cm length x 0.5cm width x 0.1cm depth; 0.196cm^2 area and 0.02cm^3 volume. There is Fat Layer (Subcutaneous Tissue) exposed. There is a medium amount of serosanguineous drainage noted. The wound margin is distinct with the outline attached to the wound base. There is medium (34-66%) red, pink granulation within the wound bed. There is a medium (34-66%) amount of necrotic tissue within the wound bed including Adherent Slough. The periwound skin appearance did not exhibit: Callus, Crepitus, Excoriation, Induration, Rash, Scarring, Dry/Scaly, Maceration, Atrophie Blanche, Cyanosis, Ecchymosis, Hemosiderin Staining, Mottled, Pallor, Rubor, Erythema. Periwound temperature was noted as No Abnormality. The periwound has tenderness on palpation. Assessment LANEA, VANKIRK ([id number]) [id number] Page 8 of 10 Active Problems ICD-10 Unspecified open wound, right lower  leg, initial encounter Chronic venous hypertension (idiopathic) with ulcer of right lower extremity Malignant neoplasm of unspecified site of unspecified female breast Chronic combined systolic (congestive) and diastolic (congestive) heart failure Alzheimer's disease, unspecified Malignant neoplasm of endometrium Patient's wound has shown improvement in size and appearance since last clinic visit. At this time I recommended endoform under 3 layer compression. Follow-up in 1 week. Procedures Wound #27 Pre-procedure diagnosis of Wound #27 is a Venous Leg Ulcer located on the Right,Anterior Lower Leg . There was a Three Layer Compression Therapy Procedure by Rhae Hammock, RN. Post procedure Diagnosis Wound #27: Same as Pre-Procedure Plan Follow-up Appointments: Return  Appointment in 1 week. - w/ Dr. Heber Merriam Woods and Allayne Butcher Rm # 9 Anesthetic: (In clinic) Topical Lidocaine 5% applied to wound bed Bathing/ Shower/ Hygiene: May shower with protection but do not get wound dressing(s) wet. - use cast protector. May shower and wash wound with soap and water. - with dressing changes may wash with soap and water. Edema Control - Lymphedema / SCD / Other: Elevate legs to the level of the heart or above for 30 minutes daily and/or when sitting, a frequency of: Avoid standing for long periods of time. Patient to wear own compression stockings every day. - apply in the morning and remove at night to right leg. Exercise regularly Moisturize legs daily. - every night before day right leg. Additional Orders / Instructions: Follow Nutritious Diet - High Protein Diet WOUND #27: - Lower Leg Wound Laterality: Right, Anterior Cleanser: Soap and Water 1 x Per Week/7 Days Discharge Instructions: May shower and wash wound with dial antibacterial soap and water prior to dressing change. Prim Dressing: Hydrofera Blue Ready Foam, 2.5 x2.5 in 1 x Per Week/7 Days ary Discharge Instructions: Apply to wound bed as instructed Prim Dressing: Santyl Ointment 1 x Per Week/7 Days ary Discharge Instructions: Apply nickel thick amount to wound bed as instructed Secondary Dressing: Woven Gauze Sponge, Non-Sterile 4x4 in 1 x Per Week/7 Days Discharge Instructions: Apply over primary dressing as directed. Com pression Wrap: ThreePress (3 layer compression wrap) 1 x Per Week/7 Days Discharge Instructions: Apply three layer compression as directed. 1. Endoform under 3 layer compression 2. Follow-up in 1 week Electronic Signature(s) Signed: 09/10/2022 11:55:19 AM By: Kalman Shan DO Entered By: Kalman Shan on 09/10/2022 11:52:51 -------------------------------------------------------------------------------- HxROS Details Patient Name: Date of Service: KEANA, DUEITT RNESTINE 09/10/2022  10:45 A M Medical Record Number: [id number] Patient Account Number: [id number] Date of Birth/Sex: Treating RN: Jul 22, 1925 (88 y.o. F) Primary Care Provider: Marda Stalker Other Clinician: Referring Provider: Treating Provider/Extender: Marjo Bicker Canon, Carlyle Basques ([id number]) [id number] Page 9 of 10 Weeks in Treatment: 41 Information Obtained From Patient Eyes Medical History: Negative for: Cataracts; Glaucoma; Optic Neuritis Ear/Nose/Mouth/Throat Medical History: Negative for: Chronic sinus problems/congestion; Middle ear problems Hematologic/Lymphatic Medical History: Positive for: Anemia Negative for: Hemophilia; Human Immunodeficiency Virus; Lymphedema; Sickle Cell Disease Respiratory Medical History: Negative for: Aspiration; Asthma; Chronic Obstructive Pulmonary Disease (COPD); Pneumothorax; Sleep Apnea; Tuberculosis Cardiovascular Medical History: Positive for: Congestive Heart Failure; Hypertension; Peripheral Venous Disease Negative for: Angina; Arrhythmia; Coronary Artery Disease; Hypotension; Myocardial Infarction; Peripheral Arterial Disease; Phlebitis; Vasculitis Gastrointestinal Medical History: Negative for: Cirrhosis ; Colitis; Crohns; Hepatitis A; Hepatitis B; Hepatitis C Endocrine Medical History: Negative for: Type I Diabetes; Type II Diabetes Genitourinary Medical History: Negative for: End Stage Renal Disease Immunological Medical History: Negative for: Lupus Erythematosus; Raynauds; Scleroderma Integumentary (Skin) Medical History: Negative for: History of Burn Musculoskeletal Medical History: Positive for: Osteoarthritis Negative  for: Gout; Rheumatoid Arthritis; Osteomyelitis Neurologic Medical History: Negative for: Neuropathy; Quadriplegia; Paraplegia; Seizure Disorder Oncologic Medical History: Positive for: Received Chemotherapy - Breast and Ovarian Cancer Negative for: Received  Radiation Immunizations Pneumococcal Vaccine: Received Pneumococcal Vaccination: Yes Received Pneumococcal Vaccination On or After [address]EDELIN, FRYER ([id number]) [id number] Page 10 of 10 Implantable Devices None Hospitalization / Surgery History Type of Hospitalization/Surgery diverticulits Family and Social History Cancer: Yes - Child; Diabetes: No; Heart Disease: No; Hereditary Spherocytosis: No; Hypertension: Yes - Child,Father; Kidney Disease: Yes - Child; Lung Disease: No; Seizures: No; Stroke: Yes - Siblings,Child; Thyroid Problems: Yes - Child; Tuberculosis: No; Never smoker; Marital Status - Widowed; Alcohol Use: Never; Drug Use: No History; Caffeine Use: Never; Financial Concerns: No; Food, Clothing or Shelter Needs: No; Support System Lacking: No; Transportation Concerns: No Electronic Signature(s) Signed: 09/10/2022 11:55:19 AM By: Kalman Shan DO Entered By: Kalman Shan on 09/10/2022 11:51:19 -------------------------------------------------------------------------------- SuperBill Details Patient Name: Date of Service: CHLOE, BLUETT RNESTINE 09/10/2022 Medical Record Number: [id number] Patient Account Number: [id number] Date of Birth/Sex: Treating RN: 23-Oct-1925 (88 y.o. [name], [name]: Marda Stalker Other Clinician: Referring Provider: Treating Provider/Extender: Aviva Signs in Treatment: 47 Diagnosis Coding ICD-10 Codes Code Description [phone] Unspecified open wound, right lower leg, initial encounter I87.311 Chronic venous hypertension (idiopathic) with ulcer of right lower extremity C50.919 Malignant neoplasm of unspecified site of unspecified female breast I50.42 Chronic combined systolic (congestive) and diastolic (congestive) heart failure G30.9 Alzheimer's disease, unspecified C54.1 Malignant neoplasm of endometrium Facility Procedures : CPT4  Code: [id number] Description: (Facility Use Only) [phone] - Sidell LWR RT LEG Modifier: Quantity: 1 Physician Procedures : CPT4 Code Description Modifier [id number] - WC PHYS LEVEL 3 - EST PT ICD-10 Diagnosis Description S81.801A Unspecified open wound, right lower leg, initial encounter I87.311 Chronic venous hypertension (idiopathic) with ulcer of right lower  extremity I50.42 Chronic combined systolic (congestive) and diastolic (congestive) heart failure G30.9 Alzheimer's disease, unspecified Quantity: 1 Electronic Signature(s) Signed: 09/10/2022 11:55:19 AM By: Kalman Shan DO Entered By: Kalman Shan on 09/10/2022 11:53:12

## 2022-09-16 NOTE — Progress Notes (Signed)
LARIAH, FLEER ([id number]) [id number] Page 1 of 8 Visit Report for 09/10/2022 Arrival Information Details Patient Name: Date of Service: [name], [name] 09/10/2022 10:45 A M Medical Record Number: [id number] Patient Account Number: [id number] Date of Birth/Sex: Treating RN: 22-Dec-1924 (88 y.o. [name], Tammi Klippel Primary Care Anthonyjames Bargar: Marda Stalker Other Clinician: Referring Marsha Gundlach: Treating Carianne Taira/Extender: Aviva Signs in Treatment: 52 Visit Information History Since Last Visit Added or deleted any medications: No Patient Arrived: Wheel Chair Any new allergies or adverse reactions: No Arrival Time: 10:55 Had a fall or experienced change in No Accompanied By: daughter activities of daily living that may affect Transfer Assistance: None risk of falls: Patient Identification Verified: Yes Signs or symptoms of abuse/neglect since last visito No Secondary Verification Process Completed: Yes Hospitalized since last visit: No Patient Requires Transmission-Based Precautions: No Implantable device outside of the clinic excluding No Patient Has Alerts: No cellular tissue based products placed in the center since last visit: Has Dressing in Place as Prescribed: Yes Has Compression in Place as Prescribed: Yes Pain Present Now: No Electronic Signature(s) Signed: 09/10/2022 6:08:03 PM By: Deon Pilling RN, BSN Entered By: Deon Pilling on 09/10/2022 10:56:56 -------------------------------------------------------------------------------- Compression Therapy Details Patient Name: Date of Service: [name], [name] 09/10/2022 10:45 A M Medical Record Number: [id number] Patient Account Number: [id number] Date of Birth/Sex: Treating RN: Aug 19, 1925 (88 y.o. [name], [name] Primary Care Maybelline Kolarik: Marda Stalker Other Clinician: Referring Richell Corker: Treating Gloria Lambertson/Extender: Aviva Signs  in Treatment: 35 Compression Therapy Performed for Wound Assessment: Wound #27 Right,Anterior Lower Leg Performed By: Clinician Rhae Hammock, RN Compression Type: Three Layer Post Procedure Diagnosis Same as Pre-procedure Electronic Signature(s) Signed: 09/16/2022 4:44:37 PM By: [name] Entered By: Rhae Hammock on 09/10/2022 11:22:20 [name] ([id number]) [id number] Page 2 of 8 -------------------------------------------------------------------------------- Encounter Discharge Information Details Patient Name: Date of Service: [name], [name] 09/10/2022 10:45 A M Medical Record Number: [id number] Patient Account Number: [id number] Date of Birth/Sex: Treating RN: November 06, 1925 (88 y.o. [name], [name] Primary Care Kahliya Fraleigh: Marda Stalker Other Clinician: Referring Miche Loughridge: Treating Marcelene Weidemann/Extender: Aviva Signs in Treatment: 61 Encounter Discharge Information Items Discharge Condition: Stable Ambulatory Status: Wheelchair Discharge Destination: Home Transportation: Private Auto Accompanied By: daughter Schedule Follow-up Appointment: Yes Clinical Summary of Care: Patient Declined Electronic Signature(s) Signed: 09/16/2022 4:44:37 PM By: [name] Entered By: Rhae Hammock on 09/10/2022 11:23:04 -------------------------------------------------------------------------------- Lower Extremity Assessment Details Patient Name: Date of Service: [name], [name] 09/10/2022 10:45 A M Medical Record Number: [id number] Patient Account Number: [id number] Date of Birth/Sex: Treating RN: 1925-08-09 (88 y.o. [name], [name] Primary Care Katlen Seyer: Marda Stalker Other Clinician: Referring Vedha Tercero: Treating Emory Leaver/Extender: Marjo Bicker Weeks in Treatment: 41 Edema Assessment Assessed: [name]: No] Patrice Paradise: Yes] Edema: [Left: N] [Right:  o] Calf Left: Right: Point of Measurement: 30 cm From Medial Instep 30.5 cm Ankle Left: Right: Point of Measurement: 9 cm From Medial Instep 23 cm Vascular Assessment Pulses: Dorsalis Pedis Palpable: [Right:Yes] Posterior Tibial Palpable: [Right:Yes] Electronic Signature(s) Signed: 09/16/2022 4:44:37 PM By: [name] Entered By: Rhae Hammock on 09/10/2022 11:17:17 [name] ([id number]) [id number] Page 3 of 8 -------------------------------------------------------------------------------- Multi Wound Chart Details Patient Name: Date of Service: [name], [name] 09/10/2022 10:45 A M Medical Record Number: [id number] Patient Account Number: [id number] Date of Birth/Sex: Treating RN: 02-04-1925 (88 y.o. F) Primary Care Linder Prajapati: Marda Stalker Other Clinician: Referring Rocio Wolak: Treating Amor Hyle/Extender: Marjo Bicker Weeks in Treatment: 26 Vital Signs Height(in): 66 Pulse(bpm): 57 Weight(lbs):  176 Blood Pressure(mmHg): 168/70 Body Mass Index(BMI): 28.4 Temperature(F): 97.9 Respiratory Rate(breaths/min): 18 [27:Photos:] [N/A:N/A] Right, Anterior Lower Leg N/A N/A Wound Location: Gradually Appeared N/A N/A Wounding Event: Venous Leg Ulcer N/A N/A Primary Etiology: Anemia, Congestive Heart Failure, N/A N/A Comorbid History: Hypertension, Peripheral Venous Disease, Osteoarthritis, Received Chemotherapy 08/23/2022 N/A N/A Date Acquired: 2 N/A N/A Weeks of Treatment: Open N/A N/A Wound Status: No N/A N/A Wound Recurrence: 0.5x0.5x0.1 N/A N/A Measurements L x W x D (cm) 0.196 N/A N/A A (cm) : rea 0.02 N/A N/A Volume (cm) : 87.50% N/A N/A % Reduction in Area: 93.60% N/A N/A % Reduction in Volume: Full Thickness With Exposed Support N/A N/A Classification: Structures Medium N/A N/A Exudate Amount: Serosanguineous N/A N/A Exudate Type: red, brown N/A N/A Exudate  Color: Distinct, outline attached N/A N/A Wound Margin: Medium (34-66%) N/A N/A Granulation Amount: Red, Pink N/A N/A Granulation Quality: Medium (34-66%) N/A N/A Necrotic Amount: Fat Layer (Subcutaneous Tissue): Yes N/A N/A Exposed Structures: Fascia: No Tendon: No Muscle: No Joint: No Bone: No Small (1-33%) N/A N/A Epithelialization: Excoriation: No N/A N/A Periwound Skin Texture: Induration: No Callus: No Crepitus: No Rash: No Scarring: No Maceration: No N/A N/A Periwound Skin Moisture: Dry/Scaly: No Atrophie Blanche: No N/A N/A Periwound Skin Color: Cyanosis: No Ecchymosis: No Erythema: No Hemosiderin Staining: No Mottled: No Pallor: No Rubor: No No Abnormality N/A N/A Temperature: Yes N/A N/A Tenderness on Palpation: Compression Therapy N/A N/A Procedures Performed: Treatment Notes GEOFFREY, MANKIN ([id number]) [id number] Page 4 of 8 Wound #27 (Lower Leg) Wound Laterality: Right, Anterior Cleanser Soap and Water Discharge Instruction: May shower and wash wound with dial antibacterial soap and water prior to dressing change. Peri-Wound Care Topical Primary Dressing Hydrofera Blue Ready Foam, 2.5 x2.5 in Discharge Instruction: Apply to wound bed as instructed Santyl Ointment Discharge Instruction: Apply nickel thick amount to wound bed as instructed Secondary Dressing Woven Gauze Sponge, Non-Sterile 4x4 in Discharge Instruction: Apply over primary dressing as directed. Secured With Compression Wrap ThreePress (3 layer compression wrap) Discharge Instruction: Apply three layer compression as directed. Compression Stockings Add-Ons Electronic Signature(s) Signed: 09/10/2022 11:55:19 AM By: Kalman Shan DO Entered By: Kalman Shan on 09/10/2022 11:49:58 -------------------------------------------------------------------------------- Multi-Disciplinary Care Plan Details Patient Name: Date of Service: [name], [name] 09/10/2022 10:45 A M Medical Record Number: [id number] Patient Account Number: [id number] Date of Birth/Sex: Treating RN: April 30, 1925 (88 y.o. [name], [name] Primary Care Cierah Crader: Marda Stalker Other Clinician: Referring Aldeen Riga: Treating Nalaysia Manganiello/Extender: Aviva Signs in Treatment: 74 Active Inactive Wound/Skin Impairment Nursing Diagnoses: Impaired tissue integrity Goals: Patient/caregiver will verbalize understanding of skin care regimen Date Initiated: 11/27/2021 Target Resolution Date: 09/18/2022 Goal Status: Active Ulcer/skin breakdown will have a volume reduction of 30% by week 4 Date Initiated: 11/27/2021 Date Inactivated: 12/31/2021 Target Resolution Date: 12/25/2021 Unmet Reason: according to Goal Status: Unmet measurements has not reduced. Interventions: Assess patient/caregiver ability to obtain necessary supplies Assess patient/caregiver ability to perform ulcer/skin care regimen upon admission and as needed Assess ulceration(s) every visit Provide education on ulcer and skin care [name], [name] ([id number]) [id number] Page 5 of 8 Treatment Activities: Topical wound management initiated : 11/27/2021 Notes: Electronic Signature(s) Signed: 09/16/2022 4:44:37 PM By: [name] Entered By: Rhae Hammock on 09/10/2022 11:21:23 -------------------------------------------------------------------------------- Pain Assessment Details Patient Name: Date of Service: [name], [name] 09/10/2022 10:45 A M Medical Record Number: [id number] Patient Account Number: [id number] Date of Birth/Sex: Treating RN: [birth year] (88 y.o. [name] Primary Care Denny Lave: Marda Stalker Other Clinician:  Referring Koriana Stepien: Treating Novalyn Lajara/Extender: Aviva Signs in Treatment: 50 Active Problems Location of Pain Severity and Description of Pain Patient Has Paino  No Site Locations Rate the pain. Current Pain Level: 0 Pain Management and Medication Current Pain Management: Medication: No Cold Application: No Rest: No Massage: No Activity: No T.E.N.S.: No Heat Application: No Leg drop or elevation: No Is the Current Pain Management Adequate: Adequate How does your wound impact your activities of daily livingo Sleep: No Bathing: No Appetite: No Relationship With Others: No Bladder Continence: No Emotions: No Bowel Continence: No Work: No Toileting: No Drive: No Dressing: No Hobbies: No Electronic Signature(s) Signed: 09/10/2022 6:08:03 PM By: Deon Pilling RN, BSN Entered By: Deon Pilling on 09/10/2022 10:57:18 [name] ([id number]) [id number] Page 6 of 8 -------------------------------------------------------------------------------- Patient/Caregiver Education Details Patient Name: Date of Service: [name], [name] 10/20/2023andnbsp10:45 A M Medical Record Number: [id number] Patient Account Number: [id number] Date of Birth/Gender: Treating RN: 01-05-1925 (88 y.o. [name], [name] Primary Care Physician: Marda Stalker Other Clinician: Referring Physician: Treating Physician/Extender: Aviva Signs in Treatment: 95 Education Assessment Education Provided To: Patient Education Topics Provided Wound/Skin Impairment: Methods: Explain/Verbal Responses: Reinforcements needed, State content correctly Electronic Signature(s) Signed: 09/16/2022 4:44:37 PM By: [name] Entered By: Rhae Hammock on 09/10/2022 11:21:34 -------------------------------------------------------------------------------- Wound Assessment Details Patient Name: Date of Service: [name], [name] 09/10/2022 10:45 A M Medical Record Number: [id number] Patient Account Number: [id number] Date of Birth/Sex: Treating RN: [birth year] (88 y.o. [name], Meta.Reding Primary Care  Emanuelle Hammerstrom: Marda Stalker Other Clinician: Referring Lyonel Morejon: Treating Terell Kincy/Extender: Marjo Bicker Weeks in Treatment: 40 Wound Status Wound Number: 27 Primary Venous Leg Ulcer Etiology: Wound Location: Right, Anterior Lower Leg Wound Open Wounding Event: Gradually Appeared Status: Date Acquired: 08/23/2022 Comorbid Anemia, Congestive Heart Failure, Hypertension, Peripheral Weeks Of Treatment: 2 History: Venous Disease, Osteoarthritis, Received Chemotherapy Clustered Wound: No Photos Wound Measurements Length: (cm) 0.5 Width: (cm) 0.5 Depth: (cm) 0.1 Area: (cm) 0.1 Volume: (cm) 0.0 Guilbert, Dezyrae ([id number]) % Reduction in Area: 87.5% % Reduction in Volume: 93.6% Epithelialization: Small (1-33%) 96 2 [id number] Page 7 of 8 Wound Description Classification: Full Thickness With Exposed Suppor Wound Margin: Distinct, outline attached Exudate Amount: Medium Exudate Type: Serosanguineous Exudate Color: red, brown t Structures Foul Odor After Cleansing: No Slough/Fibrino Yes Wound Bed Granulation Amount: Medium (34-66%) Exposed Structure Granulation Quality: Red, Pink Fascia Exposed: No Necrotic Amount: Medium (34-66%) Fat Layer (Subcutaneous Tissue) Exposed: Yes Necrotic Quality: Adherent Slough Tendon Exposed: No Muscle Exposed: No Joint Exposed: No Bone Exposed: No Periwound Skin Texture Texture Color No Abnormalities Noted: No No Abnormalities Noted: No Callus: No Atrophie Blanche: No Crepitus: No Cyanosis: No Excoriation: No Ecchymosis: No Induration: No Erythema: No Rash: No Hemosiderin Staining: No Scarring: No Mottled: No Pallor: No Moisture Rubor: No No Abnormalities Noted: No Dry / Scaly: No Temperature / Pain Maceration: No Temperature: No Abnormality Tenderness on Palpation: Yes Treatment Notes Wound #27 (Lower Leg) Wound Laterality: Right, Anterior Cleanser Soap and  Water Discharge Instruction: May shower and wash wound with dial antibacterial soap and water prior to dressing change. Peri-Wound Care Topical Primary Dressing Hydrofera Blue Ready Foam, 2.5 x2.5 in Discharge Instruction: Apply to wound bed as instructed Santyl Ointment Discharge Instruction: Apply nickel thick amount to wound bed as instructed Secondary Dressing Woven Gauze Sponge, Non-Sterile 4x4 in Discharge Instruction: Apply over primary dressing as directed. Secured With Compression Wrap ThreePress (3 layer compression wrap) Discharge Instruction: Apply three layer compression as  directed. Compression Stockings Add-Ons Electronic Signature(s) Signed: 09/10/2022 6:08:03 PM By: Deon Pilling RN, BSN Entered By: Deon Pilling on 09/10/2022 11:15:51 [name] ([id number]) [id number] Page 8 of 8 -------------------------------------------------------------------------------- Vitals Details Patient Name: Date of Service: [name], [name] 09/10/2022 10:45 A M Medical Record Number: [id number] Patient Account Number: [id number] Date of Birth/Sex: Treating RN: 30-May-1925 (88 y.o. [name], Tammi Klippel Primary Care Grainne Knights: Marda Stalker Other Clinician: Referring Rollande Thursby: Treating Jaquise Faux/Extender: Aviva Signs in Treatment: 54 Vital Signs Time Taken: 10:55 Temperature (F): 97.9 Height (in): 66 Pulse (bpm): 57 Weight (lbs): 176 Respiratory Rate (breaths/min): 18 Body Mass Index (BMI): 28.4 Blood Pressure (mmHg): 168/70 Reference Range: 80 - 120 mg / dl Electronic Signature(s) Signed: 09/10/2022 6:08:03 PM By: Deon Pilling RN, BSN Entered By: Deon Pilling on 09/10/2022 10:57:10

## 2022-09-20 ENCOUNTER — Encounter (HOSPITAL_BASED_OUTPATIENT_CLINIC_OR_DEPARTMENT_OTHER): Payer: Medicare PPO | Admitting: Internal Medicine

## 2022-09-20 DIAGNOSIS — F0280 Dementia in other diseases classified elsewhere without behavioral disturbance: Secondary | ICD-10-CM | POA: Diagnosis not present

## 2022-09-20 DIAGNOSIS — I110 Hypertensive heart disease with heart failure: Secondary | ICD-10-CM | POA: Diagnosis not present

## 2022-09-20 DIAGNOSIS — I5042 Chronic combined systolic (congestive) and diastolic (congestive) heart failure: Secondary | ICD-10-CM

## 2022-09-20 DIAGNOSIS — G309 Alzheimer's disease, unspecified: Secondary | ICD-10-CM | POA: Diagnosis not present

## 2022-09-20 DIAGNOSIS — L97812 Non-pressure chronic ulcer of other part of right lower leg with fat layer exposed: Secondary | ICD-10-CM | POA: Diagnosis not present

## 2022-09-20 DIAGNOSIS — C50919 Malignant neoplasm of unspecified site of unspecified female breast: Secondary | ICD-10-CM | POA: Diagnosis not present

## 2022-09-20 DIAGNOSIS — C541 Malignant neoplasm of endometrium: Secondary | ICD-10-CM | POA: Diagnosis not present

## 2022-09-20 DIAGNOSIS — I87311 Chronic venous hypertension (idiopathic) with ulcer of right lower extremity: Secondary | ICD-10-CM

## 2022-09-20 DIAGNOSIS — S81801A Unspecified open wound, right lower leg, initial encounter: Secondary | ICD-10-CM | POA: Diagnosis not present

## 2022-09-20 NOTE — Progress Notes (Signed)
[name] [name], [name] [name] ([id number]) [id number] Page 1 of 10 Visit Report for 09/20/2022 Chief Complaint Document Details Patient Name: Date of Service: [name] [name] 09/20/2022 10:45 A M Medical Record Number: [id number] Patient Account Number: [id number] Date of Birth/Sex: Treating [name]: [birth year] (88 y.o. F) Primary Care Provider: Marda Stalker Other Clinician: Referring Provider: Treating Provider/Extender: Aviva Signs in Treatment: 26 Information Obtained from: Patient Chief Complaint 11/27/2021; Left lower extremity wound 08/26/2022; right lower extremity wound Electronic Signature(s) Signed: 09/20/2022 2:03:41 PM By: Kalman Shan DO Entered By: Kalman Shan on 09/20/2022 11:39:41 -------------------------------------------------------------------------------- HPI Details Patient Name: Date of Service: [name] [name], [name] [name] 09/20/2022 10:45 A M Medical Record Number: [id number] Patient Account Number: [id number] Date of Birth/Sex: Treating [name]: 06-25-1925 (88 y.o. F) Primary Care Provider: Marda Stalker Other Clinician: Referring Provider: Treating Provider/Extender: Aviva Signs in Treatment: 89 History of Present Illness HPI Description: this is a patient we know from several prior wounds on her bilateral lower extremities. She has venous stasis physiology, inflammation and hypertension. She is been fully evaluated for venous ablation and was found not to be a candidate. She does not have significant arterial disease. 02/14/15 the wound itself on her lateral left leg did not look too bad however there was surrounding maceration which was concerning 02/27/15 the wound itself is small with surrounding circumferential epithelialization there is no surrounding maceration. 03/06/15 only a very small area remains. I think this is mostly epithelialized however I would be uncomfortable not  dressing this this week 03/13/15 the area is epithelialized. There was a thick surface on this. I took a #15 blade then lightly disrupted it but there does not appear to be any open area I did not see anything but appropriate epithelium Readmission: 08/16/18 on evaluation today patient presents for initial evaluation and our clinic released readmission although she has not been seen since April 2016. Nonetheless she is having issues with the ulcers currently on the bilateral lateral malleolus or locations as well as the left lateral lower extremity. These have been present for roughly 3 weeks since the patient actually developed significant bilateral lower extremity edema which patient's daughter states was roughly 3 times the size of her legs currently. Subsequently she ended up in the emerging department due to congestive heart failure. They were able to get the swelling under control and fortunately she seems to be doing much better at this point in time. She was placed in an [employer] which she sat on for week part evaluation today as well. Fortunately the wound areas actually seem to be doing fairly well there is some macerated skin surrounding there are the areas/surface of the wound which are gonna require some debridement at this point. No fevers, chills, nausea, or vomiting noted at this time. Patient has a history of hypertension, mild peripheral vascular disease, chronic venous stasis, and other than the ulcer seems to be doing fairly well today. No fevers, chills, nausea, or vomiting noted at this time. 08/23/18 on evaluation today patient actually appears to be doing great in regard to her bilateral lower should be ulcers. She's shown signs of improvement even just with one week with the [employer]. In general I'm actually very pleased with how things appear at this point. 09/06/18 on evaluation today patient actually appears to be doing much better her left lower extremity is  completely healed. Go to the right lower extremity lateral malleolus ulcer this still seems to be showing signs of being open.  There does not appear to be evidence of infection which is good news. This did require some sharp debridement however to remove some of the necrotic tissue/eschar on the surface of the wound Readmission 11/27/2021 [name] [name] is a 88 year old female with a past medical history of endometrial and breast cancer, chronic venous insufficiency, dementia and essential hypertension that presents to the clinic with a 1 month history of nonhealing ulcer to the left lower extremity. She states that the ulcer opened 1 Marzo, [name] ([id number]) [id number] Page 2 of 10 month ago and then healed but has reopened again in the past week. She is currently keeping the area covered. She has a history of wounds to her lower extremities. She has compression stockings that she uses daily. She currently denies signs of infection. 1/13; patient presents for follow-up. She followed up for nurse visit for wrap change. She reports more odor. She denies pain. 1/20; patient presents for follow-up. She has been using gentamicin ointment with calcium alginate to the wound bed. She reports improvement in odor and drainage. She currently denies systemic signs of infection. 1/27; patient presents for follow-up. She has been using gentamicin ointment with calcium alginate. She received her Keystone antibiotic 2 days ago. She brought this in. Home health has also been established. They will come out for the first time next week. She currently denies signs of infection. She has no issues or complaints today. 2/2; patient presents for follow-up. She has been using Keystone antibiotics with [facility] with no issues. She denies signs of infection. She has no issues or complaints today. 2/9; patient presents for follow-up. She has been using Keystone antibiotics with  [facility] with no issues. She has tolerated the compression wrap well. She denies signs of infection. 2/16; patient presents for follow-up. She has been using Keystone antibiotics with Hydrofera Blue under compression wrap with no issues. She currently denies signs of infection. 2/23; patient presents for follow-up. She continues to use Keystone antibiotics with Hydrofera Blue under the compression wrap. She has no issues or complaints today. She denies signs of infection. Home health continues to come out and change the wrap. 3/2; patient presents for follow-up. She continues to use [facility] with [facility] under compression with no issues. She denies signs of infection. 3/16; patient presents for follow-up. She continues to use [facility] with [facility] under compression with no issues. Home health change the dressing last week. She denies signs of infection. 3/23; patient presents for follow-up. She continues to use Keystone antibiotics with Hydrofera Blue under compression with no issues. Home health continues to change the dressings. She denies signs of infection. 3/30; patient presents for follow-up. She continues to use Keystone antibiotics with Hydrofera Blue under compression with no issues. Home health continues to change the dressings. 4/13 2-week follow-up. Keystone and Hydrofera Blue wound is roughly 2.3 cm in diameter smaller. No other issues 4/20; patient presents for follow-up. She continues to use Keystone antibiotic with [facility] with no issues. 4/28; patient with a difficult wound on her right medial lower leg. Using [facility] Blue minimal improvement per our intake nurse we are using compression. She has home health changing the dressing 5/5; patient presents for follow-up. We have been using Keystone antibiotics and Hydrofera Blue under compression therapy. She has no issues or complaints today. We discussed potentially using an advanced tissue  product. She states she has had this done in the past and would like to proceed to see what her  insurance would cover. 5/15; patient presents for follow-up. She has been using Keystone antibiotics and Hydrofera Blue under compression therapy. She forgot her Keystone antibiotics today. Insurance has not approved her for skin substitute. She currently denies signs of infection. She has home health that changes the dressing once a week. 5/19; patient presents for follow-up. We have been using Keystone antibiotic and Hydrofera Blue under compression therapy. She has no issues or complaints today. The [employer] rep confirmed over the phone that the patient has been 100% approved for their skin substitute. We will verify this as we have not received any paperwork stating this. Patient states she would like to proceed with a skin substitute if it is covered by insurance. 6/1; patient presents for follow-up. We have been using Keystone antibiotic and Hydrofera Blue under compression therapy. We confirmed that she does have a co-pay for the [employer] skin substitute. We will hold off on using this for now since she continues to show improvement with current therapy. She denies signs of infection. She has home health that comes out twice weekly. 6/16; patient presents for follow-up. We have been using Keystone antibiotic and Hydrofera Blue under compression therapy. She has no issues or complaints today. We discussed the potential trial of vendaje. This is placenta tissue. She states she would like to try the free trial. We will reach out to our rep. 6/30; patient presents for follow-up. She has been using Keystone antibiotic and Hydrofera Blue under compression therapy. She has been approved for a trial of vendaje. She would like to proceed with this and We will have this available at next clinic visit. She has no issues or complaints today. 7/18; patient presents for follow-up. She has been using Keystone  antibiotic and Hydrofera Blue under compression. She has no issues or complaints today. We have a free sample of vendaje skin substitute and patient is agreeable to having this placed today. 7/24; patient presents for follow-up. We placed the first free trial of vendaje last week under compression wrap. Patient had no issues or complaints today. She tolerated this well. 8/1; patient presents for follow-up. Vandaje #2 donated product was placed in standard fashion last week under compression therapy. She has no issues or complaints today. 8/8; patient presents for follow-up. Vendaje #3 donated skin set was placed in standard fashion last visit under compression therapy. She has no issues or complaints today. 8/15; Patient presents for follow-up. Vandaje #4 was placed in standard fashion last week under compression therapy. She has no issues or complaints today. 8/22; patient presents for follow-up. Vandaje #5 was placed in standard fashion last week under compression therapy. She tolerated this well. 8/29; patient presents for follow-up. We have been using Xeroform under compression therapy and she has tolerated this well. She has no issues or complaints today. She has home health that is been coming out weekly For dressing changes. 9/12; patient presents for follow-up. We have been using Xeroform under compression therapy. Home health has been changing the dressings. She has no issues or complaints today. 9/19; patient presents for follow-up. We have been using Xeroform under compression therapy. She has no issues or complaints today. 9/26; patient presents for follow-up. We have been using Xeroform under compression therapy. She has no issues or complaints today. Admission 08/26/2022 [name] [name] is a 88 year old female that was recently discharged from our clinic last week due to a closed wound to the left lower extremity. [name] [name], [name] [name] ([id number])  [id number] Page 3 of 10 Unfortunately  she has developed a new wound to the right lower extremity. It appears that this is from trauma. 10/13; patient presents for follow-up. She tolerated the compression wrap well. She has no issues or complaints today. We have been using Santyl and Hydrofera Blue under the wrap. 10/20; patient presents for follow-up. We have been using Santyl and Hydrofera Blue under 3 layer compression. Her wound is healing nicely. She has no issues or complaints today. 10/30; patient presents for follow-up. We have been using endoform under 3 layer compression. Her wound is almost healed. Electronic Signature(s) Signed: 09/20/2022 2:03:41 PM By: Kalman Shan DO Entered By: Kalman Shan on 09/20/2022 11:40:05 -------------------------------------------------------------------------------- Physical Exam Details Patient Name: Date of Service: ANIEYA, HELMAN [name] 09/20/2022 10:45 A M Medical Record Number: [id number] Patient Account Number: [id number] Date of Birth/Sex: Treating [name]: 07/12/1925 (88 y.o. F) Primary Care Provider: Marda Stalker Other Clinician: Referring Provider: Treating Provider/Extender: Aviva Signs in Treatment: 73 Constitutional respirations regular, non-labored and within target range for patient.. Cardiovascular 2+ dorsalis pedis/posterior tibialis pulses. Psychiatric pleasant and cooperative. Notes Right lower extremity: T the anterior aspect there is a scab and post removable a small wound with granulation tissue present. No signs of infection. Good o edema control. Electronic Signature(s) Signed: 09/20/2022 2:03:41 PM By: Kalman Shan DO Entered By: Kalman Shan on 09/20/2022 11:41:19 -------------------------------------------------------------------------------- Physician Orders Details Patient Name: Date of Service: [name] [name], [name] [name] 09/20/2022 10:45 A  M Medical Record Number: [id number] Patient Account Number: [id number] Date of Birth/Sex: Treating [name]: July 01, 1925 (88 y.o. [name], [name]: Marda Stalker Other Clinician: Referring Provider: Treating Provider/Extender: Aviva Signs in Treatment: 65 Verbal / Phone Orders: No Diagnosis Coding Follow-up Appointments ppointment in 1 week. - w/ Dr. Heber Campo on Thursday @ 1130 Return A Anesthetic (In clinic) Topical Lidocaine 5% applied to wound bed [address] VIRDELL, HOILAND ([id number]) [id number] Page 4 of 10 May shower with protection but do not get wound dressing(s) wet. - use cast protector. May shower and wash wound with soap and water. - with dressing changes may wash with soap and water. Edema Control - Lymphedema / SCD / Other Elevate legs to the level of the heart or above for 30 minutes daily and/or when sitting, a frequency of: Avoid standing for long periods of time. Patient to wear own compression stockings every day. - apply in the morning and remove at night to right leg. Exercise regularly Moisturize legs daily. - every night before day right leg. Additional Orders / Instructions Follow Nutritious Diet - High Protein Diet Wound Treatment Wound #27 - Lower Leg Wound Laterality: Right, Anterior Cleanser: Soap and Water 1 x Per Week/7 Days Discharge Instructions: May shower and wash wound with dial antibacterial soap and water prior to dressing change. Prim Dressing: Xeroform Occlusive Gauze Dressing, 4x4 in 1 x Per Week/7 Days ary Discharge Instructions: Apply to wound bed as instructed Secondary Dressing: Woven Gauze Sponge, Non-Sterile 4x4 in 1 x Per Week/7 Days Discharge Instructions: Apply over primary dressing as directed. Compression Wrap: ThreePress (3 layer compression wrap) 1 x Per Week/7 Days Discharge Instructions: Apply three layer compression as  directed. Electronic Signature(s) Signed: 09/20/2022 2:03:41 PM By: Kalman Shan DO Entered By: Kalman Shan on 09/20/2022 11:48:37 -------------------------------------------------------------------------------- Problem List Details Patient Name: Date of Service: GAY, MONCIVAIS [name] 09/20/2022 10:45 A M Medical Record Number: [id number] Patient Account Number: [id number] Date of Birth/Sex: Treating [name]: 03/06/1925 (88 y.o. F) Primary Care Provider: Marda Stalker Other Clinician:  Referring Provider: Treating Provider/Extender: Aviva Signs in Treatment: 42 Active Problems ICD-10 Encounter Code Description Active Date MDM Diagnosis I87.311 Chronic venous hypertension (idiopathic) with ulcer of right lower extremity 08/26/2022 No Yes L97.812 Non-pressure chronic ulcer of other part of right lower leg with fat layer 09/20/2022 No Yes exposed C50.919 Malignant neoplasm of unspecified site of unspecified female breast 11/27/2021 No Yes I50.42 Chronic combined systolic (congestive) and diastolic (congestive) heart failure 11/27/2021 No Yes G30.9 Alzheimer's disease, unspecified 11/27/2021 No Yes MERIAN, WROE ([id number]) [id number] Page 5 of 10 C54.1 Malignant neoplasm of endometrium 11/27/2021 No Yes Inactive Problems Resolved Problems ICD-10 Code Description Active Date Resolved Date I87.312 Chronic venous hypertension (idiopathic) with ulcer of left lower extremity 11/27/2021 11/27/2021 M76.720 Non-pressure chronic ulcer of other part of left lower leg with fat layer exposed 11/27/2021 11/27/2021 Electronic Signature(s) Signed: 09/20/2022 2:03:41 PM By: Kalman Shan DO Entered By: Kalman Shan on 09/20/2022 11:50:35 -------------------------------------------------------------------------------- Progress Note Details Patient Name: Date of Service: [name] [name], [name] [name] [name] 09/20/2022 10:45 A M Medical Record Number:  [id number] Patient Account Number: [id number] Date of Birth/Sex: Treating [name]: 22-Mar-1925 (88 y.o. F) Primary Care Provider: Marda Stalker Other Clinician: Referring Provider: Treating Provider/Extender: Aviva Signs in Treatment: 32 Subjective Chief Complaint Information obtained from Patient 11/27/2021; Left lower extremity wound 08/26/2022; right lower extremity wound History of Present Illness (HPI) this is a patient we know from several prior wounds on her bilateral lower extremities. She has venous stasis physiology, inflammation and hypertension. She is been fully evaluated for venous ablation and was found not to be a candidate. She does not have significant arterial disease. 02/14/15 the wound itself on her lateral left leg did not look too bad however there was surrounding maceration which was concerning 02/27/15 the wound itself is small with surrounding circumferential epithelialization there is no surrounding maceration. 03/06/15 only a very small area remains. I think this is mostly epithelialized however I would be uncomfortable not dressing this this week 03/13/15 the area is epithelialized. There was a thick surface on this. I took a #15 blade then lightly disrupted it but there does not appear to be any open area I did not see anything but appropriate epithelium Readmission: 08/16/18 on evaluation today patient presents for initial evaluation and our clinic released readmission although she has not been seen since April 2016. Nonetheless she is having issues with the ulcers currently on the bilateral lateral malleolus or locations as well as the left lateral lower extremity. These have been present for roughly 3 weeks since the patient actually developed significant bilateral lower extremity edema which patient's daughter states was roughly 3 times the size of her legs currently. Subsequently she ended up in the emerging department due to congestive heart  failure. They were able to get the swelling under control and fortunately she seems to be doing much better at this point in time. She was placed in an [employer] which she sat on for week part evaluation today as well. Fortunately the wound areas actually seem to be doing fairly well there is some macerated skin surrounding there are the areas/surface of the wound which are gonna require some debridement at this point. No fevers, chills, nausea, or vomiting noted at this time. Patient has a history of hypertension, mild peripheral vascular disease, chronic venous stasis, and other than the ulcer seems to be doing fairly well today. No fevers, chills, nausea, or vomiting noted at this time. 08/23/18 on evaluation today patient  actually appears to be doing great in regard to her bilateral lower should be ulcers. She's shown signs of improvement even just with one week with the [employer]. In general I'm actually very pleased with how things appear at this point. 09/06/18 on evaluation today patient actually appears to be doing much better her left lower extremity is completely healed. Go to the right lower extremity lateral malleolus ulcer this still seems to be showing signs of being open. There does not appear to be evidence of infection which is good news. This did require some sharp debridement however to remove some of the necrotic tissue/eschar on the surface of the wound Readmission 11/27/2021 Ms. Melaina Howerton is a 88 year old female with a past medical history of endometrial and breast cancer, chronic venous insufficiency, dementia and essential hypertension that presents to the clinic with a 1 month history of nonhealing ulcer to the left lower extremity. She states that the ulcer opened 1 month ago and then healed but has reopened again in the past week. She is currently keeping the area covered. She has a history of wounds to her lower extremities. She has compression stockings  that she uses daily. She currently denies signs of infection. 1/13; patient presents for follow-up. She followed up for nurse visit for wrap change. She reports more odor. She denies pain. [name] [name], [name] [name] ([id number]) [id number] Page 6 of 10 1/20; patient presents for follow-up. She has been using gentamicin ointment with calcium alginate to the wound bed. She reports improvement in odor and drainage. She currently denies systemic signs of infection. 1/27; patient presents for follow-up. She has been using gentamicin ointment with calcium alginate. She received her Keystone antibiotic 2 days ago. She brought this in. Home health has also been established. They will come out for the first time next week. She currently denies signs of infection. She has no issues or complaints today. 2/2; patient presents for follow-up. She has been using Keystone antibiotics with [facility] with no issues. She denies signs of infection. She has no issues or complaints today. 2/9; patient presents for follow-up. She has been using Keystone antibiotics with [facility] with no issues. She has tolerated the compression wrap well. She denies signs of infection. 2/16; patient presents for follow-up. She has been using Keystone antibiotics with Hydrofera Blue under compression wrap with no issues. She currently denies signs of infection. 2/23; patient presents for follow-up. She continues to use Keystone antibiotics with Hydrofera Blue under the compression wrap. She has no issues or complaints today. She denies signs of infection. Home health continues to come out and change the wrap. 3/2; patient presents for follow-up. She continues to use [facility] with [facility] under compression with no issues. She denies signs of infection. 3/16; patient presents for follow-up. She continues to use [facility] with [facility] under compression with no issues. Home health change the  dressing last week. She denies signs of infection. 3/23; patient presents for follow-up. She continues to use Keystone antibiotics with Hydrofera Blue under compression with no issues. Home health continues to change the dressings. She denies signs of infection. 3/30; patient presents for follow-up. She continues to use Keystone antibiotics with Hydrofera Blue under compression with no issues. Home health continues to change the dressings. 4/13 2-week follow-up. Keystone and Hydrofera Blue wound is roughly 2.3 cm in diameter smaller. No other issues 4/20; patient presents for follow-up. She continues to use Keystone antibiotic with [facility] with no issues. 4/28; patient with a  difficult wound on her right medial lower leg. Using [facility] Blue minimal improvement per our intake nurse we are using compression. She has home health changing the dressing 5/5; patient presents for follow-up. We have been using Keystone antibiotics and Hydrofera Blue under compression therapy. She has no issues or complaints today. We discussed potentially using an advanced tissue product. She states she has had this done in the past and would like to proceed to see what her insurance would cover. 5/15; patient presents for follow-up. She has been using Keystone antibiotics and Hydrofera Blue under compression therapy. She forgot her Keystone antibiotics today. Insurance has not approved her for skin substitute. She currently denies signs of infection. She has home health that changes the dressing once a week. 5/19; patient presents for follow-up. We have been using Keystone antibiotic and Hydrofera Blue under compression therapy. She has no issues or complaints today. The [employer] rep confirmed over the phone that the patient has been 100% approved for their skin substitute. We will verify this as we have not received any paperwork stating this. Patient states she would like to proceed with a skin  substitute if it is covered by insurance. 6/1; patient presents for follow-up. We have been using Keystone antibiotic and Hydrofera Blue under compression therapy. We confirmed that she does have a co-pay for the [employer] skin substitute. We will hold off on using this for now since she continues to show improvement with current therapy. She denies signs of infection. She has home health that comes out twice weekly. 6/16; patient presents for follow-up. We have been using Keystone antibiotic and Hydrofera Blue under compression therapy. She has no issues or complaints today. We discussed the potential trial of vendaje. This is placenta tissue. She states she would like to try the free trial. We will reach out to our rep. 6/30; patient presents for follow-up. She has been using Keystone antibiotic and Hydrofera Blue under compression therapy. She has been approved for a trial of vendaje. She would like to proceed with this and We will have this available at next clinic visit. She has no issues or complaints today. 7/18; patient presents for follow-up. She has been using Keystone antibiotic and Hydrofera Blue under compression. She has no issues or complaints today. We have a free sample of vendaje skin substitute and patient is agreeable to having this placed today. 7/24; patient presents for follow-up. We placed the first free trial of vendaje last week under compression wrap. Patient had no issues or complaints today. She tolerated this well. 8/1; patient presents for follow-up. Vandaje #2 donated product was placed in standard fashion last week under compression therapy. She has no issues or complaints today. 8/8; patient presents for follow-up. Vendaje #3 donated skin set was placed in standard fashion last visit under compression therapy. She has no issues or complaints today. 8/15; Patient presents for follow-up. Vandaje #4 was placed in standard fashion last week under compression therapy. She  has no issues or complaints today. 8/22; patient presents for follow-up. Vandaje #5 was placed in standard fashion last week under compression therapy. She tolerated this well. 8/29; patient presents for follow-up. We have been using Xeroform under compression therapy and she has tolerated this well. She has no issues or complaints today. She has home health that is been coming out weekly For dressing changes. 9/12; patient presents for follow-up. We have been using Xeroform under compression therapy. Home health has been changing the dressings. She has no  issues or complaints today. 9/19; patient presents for follow-up. We have been using Xeroform under compression therapy. She has no issues or complaints today. 9/26; patient presents for follow-up. We have been using Xeroform under compression therapy. She has no issues or complaints today. Admission 08/26/2022 [name] [name] is a 88 year old female that was recently discharged from our clinic last week due to a closed wound to the left lower extremity. Unfortunately she has developed a new wound to the right lower extremity. It appears that this is from trauma. 10/13; patient presents for follow-up. She tolerated the compression wrap well. She has no issues or complaints today. We have been using Santyl and Hydrofera Blue under the wrap. SHAVONNA, CORELLA ([id number]) [id number] Page 7 of 10 10/20; patient presents for follow-up. We have been using Santyl and Hydrofera Blue under 3 layer compression. Her wound is healing nicely. She has no issues or complaints today. 10/30; patient presents for follow-up. We have been using endoform under 3 layer compression. Her wound is almost healed. Patient History Information obtained from Patient. Family History Cancer - Child, Hypertension - Child,Father, Kidney Disease - Child, Stroke - Siblings,Child, Thyroid Problems - Child, No family history of Diabetes, Heart  Disease, Hereditary Spherocytosis, Lung Disease, Seizures, Tuberculosis. Social History Never smoker, Marital Status - Widowed, Alcohol Use - Never, Drug Use - No History, Caffeine Use - Never. Medical History Eyes Denies history of Cataracts, Glaucoma, Optic Neuritis Ear/Nose/Mouth/Throat Denies history of Chronic sinus problems/congestion, Middle ear problems Hematologic/Lymphatic Patient has history of Anemia Denies history of Hemophilia, Human Immunodeficiency Virus, Lymphedema, Sickle Cell Disease Respiratory Denies history of Aspiration, Asthma, Chronic Obstructive Pulmonary Disease (COPD), Pneumothorax, Sleep Apnea, Tuberculosis Cardiovascular Patient has history of Congestive Heart Failure, Hypertension, Peripheral Venous Disease Denies history of Angina, Arrhythmia, Coronary Artery Disease, Hypotension, Myocardial Infarction, Peripheral Arterial Disease, Phlebitis, Vasculitis Gastrointestinal Denies history of Cirrhosis , Colitis, Crohnoos, Hepatitis A, Hepatitis B, Hepatitis C Endocrine Denies history of Type I Diabetes, Type II Diabetes Genitourinary Denies history of End Stage Renal Disease Immunological Denies history of Lupus Erythematosus, Raynaudoos, Scleroderma Integumentary (Skin) Denies history of History of Burn Musculoskeletal Patient has history of Osteoarthritis Denies history of Gout, Rheumatoid Arthritis, Osteomyelitis Neurologic Denies history of Neuropathy, Quadriplegia, Paraplegia, Seizure Disorder Oncologic Patient has history of Received Chemotherapy - Breast and Ovarian Cancer Denies history of Received Radiation Hospitalization/Surgery History - diverticulits. Objective Constitutional respirations regular, non-labored and within target range for patient.. Vitals Time Taken: 11:04 AM, Height: 66 in, Weight: 176 lbs, BMI: 28.4, Temperature: 97.7 F, Pulse: 65 bpm, Respiratory Rate: 18 breaths/min, Blood Pressure: 163/76 mmHg. Cardiovascular 2+  dorsalis pedis/posterior tibialis pulses. Psychiatric pleasant and cooperative. General Notes: Right lower extremity: T the anterior aspect there is a scab and post removable a small wound with granulation tissue present. No signs of o infection. Good edema control. Integumentary (Hair, Skin) Wound #27 status is Open. Original cause of wound was Gradually Appeared. The date acquired was: 08/23/2022. The wound has been in treatment 3 weeks. The wound is located on the Right,Anterior Lower Leg. The wound measures 0.6cm length x 0.3cm width x 0.1cm depth; 0.141cm^2 area and 0.014cm^3 volume. There is Fat Layer (Subcutaneous Tissue) exposed. There is no tunneling or undermining noted. There is a medium amount of serosanguineous drainage noted. The wound margin is distinct with the outline attached to the wound base. There is medium (34-66%) red, pink granulation within the wound bed. There is a medium (34-66%) amount of necrotic tissue within the wound bed  including Cisco. The periwound skin appearance did not exhibit: Callus, Crepitus, Excoriation, Induration, Rash, Scarring, Dry/Scaly, Maceration, Atrophie Blanche, Cyanosis, Ecchymosis, Hemosiderin Staining, Mottled, Pallor, Rubor, Erythema. Periwound temperature was noted as No Abnormality. The periwound has tenderness on palpation. [name] [name], [name] [name] ([id number]) [id number] Page 8 of 10 Assessment Active Problems ICD-10 Chronic venous hypertension (idiopathic) with ulcer of right lower extremity Non-pressure chronic ulcer of other part of right lower leg with fat layer exposed Malignant neoplasm of unspecified site of unspecified female breast Chronic combined systolic (congestive) and diastolic (congestive) heart failure Alzheimer's disease, unspecified Malignant neoplasm of endometrium Patient's wound has shown improvement in size and appearance since last clinic visit. There is a scab and when removed  there is a very small open wound still present. I recommended Xeroform to this and continue 3 layer compression. Follow-up in 1 week. Procedures Wound #27 Pre-procedure diagnosis of Wound #27 is a Venous Leg Ulcer located on the Right,Anterior Lower Leg . There was a Three Layer Compression Therapy Procedure by Rhae Hammock, [name]. Post procedure Diagnosis Wound #27: Same as Pre-Procedure Plan Follow-up Appointments: Return Appointment in 1 week. - w/ Dr. Heber Cayey on Thursday @ 1130 Anesthetic: (In clinic) Topical Lidocaine 5% applied to wound bed Bathing/ Shower/ Hygiene: May shower with protection but do not get wound dressing(s) wet. - use cast protector. May shower and wash wound with soap and water. - with dressing changes may wash with soap and water. Edema Control - Lymphedema / SCD / Other: Elevate legs to the level of the heart or above for 30 minutes daily and/or when sitting, a frequency of: Avoid standing for long periods of time. Patient to wear own compression stockings every day. - apply in the morning and remove at night to right leg. Exercise regularly Moisturize legs daily. - every night before day right leg. Additional Orders / Instructions: Follow Nutritious Diet - High Protein Diet WOUND #27: - Lower Leg Wound Laterality: Right, Anterior Cleanser: Soap and Water 1 x Per Week/7 Days Discharge Instructions: May shower and wash wound with dial antibacterial soap and water prior to dressing change. Prim Dressing: Xeroform Occlusive Gauze Dressing, 4x4 in 1 x Per Week/7 Days ary Discharge Instructions: Apply to wound bed as instructed Secondary Dressing: Woven Gauze Sponge, Non-Sterile 4x4 in 1 x Per Week/7 Days Discharge Instructions: Apply over primary dressing as directed. Com pression Wrap: ThreePress (3 layer compression wrap) 1 x Per Week/7 Days Discharge Instructions: Apply three layer compression as directed. 1. Xeroform under 3 layer compression 2.  Follow-up in 1 week Electronic Signature(s) Signed: 09/20/2022 2:03:41 PM By: Kalman Shan DO Entered By: Kalman Shan on 09/20/2022 11:51:19 -------------------------------------------------------------------------------- HxROS Details Patient Name: Date of Service: [name] [name], [name] [name] 09/20/2022 10:45 A M Medical Record Number: [id number] Patient Account Number: [id number] Date of Birth/Sex: Treating [name]: 1925/01/03 (88 y.o. Miles Borkowski, Amandalynn ([id number]) [id number] Page 9 of 10 Primary Care Provider: Marda Stalker Other Clinician: Referring Provider: Treating Provider/Extender: Aviva Signs in Treatment: 91 Information Obtained From Patient Eyes Medical History: Negative for: Cataracts; Glaucoma; Optic Neuritis Ear/Nose/Mouth/Throat Medical History: Negative for: Chronic sinus problems/congestion; Middle ear problems Hematologic/Lymphatic Medical History: Positive for: Anemia Negative for: Hemophilia; Human Immunodeficiency Virus; Lymphedema; Sickle Cell Disease Respiratory Medical History: Negative for: Aspiration; Asthma; Chronic Obstructive Pulmonary Disease (COPD); Pneumothorax; Sleep Apnea; Tuberculosis Cardiovascular Medical History: Positive for: Congestive Heart Failure; Hypertension; Peripheral Venous Disease Negative for: Angina; Arrhythmia; Coronary Artery Disease; Hypotension; Myocardial Infarction; Peripheral Arterial Disease; Phlebitis; Vasculitis Gastrointestinal Medical History:  Negative for: Cirrhosis ; Colitis; Crohns; Hepatitis A; Hepatitis B; Hepatitis C Endocrine Medical History: Negative for: Type I Diabetes; Type II Diabetes Genitourinary Medical History: Negative for: End Stage Renal Disease Immunological Medical History: Negative for: Lupus Erythematosus; Raynauds; Scleroderma Integumentary (Skin) Medical History: Negative for: History of Burn Musculoskeletal Medical  History: Positive for: Osteoarthritis Negative for: Gout; Rheumatoid Arthritis; Osteomyelitis Neurologic Medical History: Negative for: Neuropathy; Quadriplegia; Paraplegia; Seizure Disorder Oncologic Medical History: Positive for: Received Chemotherapy - Breast and Ovarian Cancer Negative for: Received Radiation Immunizations [name] [name], [name] [name] ([id number]) [id number] Page 10 of 10 Pneumococcal Vaccine: Received Pneumococcal Vaccination: Yes Received Pneumococcal Vaccination On or After 60th Birthday: Yes Implantable Devices None Hospitalization / Surgery History Type of Hospitalization/Surgery diverticulits Family and Social History Cancer: Yes - Child; Diabetes: No; Heart Disease: No; Hereditary Spherocytosis: No; Hypertension: Yes - Child,Father; Kidney Disease: Yes - Child; Lung Disease: No; Seizures: No; Stroke: Yes - Siblings,Child; Thyroid Problems: Yes - Child; Tuberculosis: No; Never smoker; Marital Status - Widowed; Alcohol Use: Never; Drug Use: No History; Caffeine Use: Never; Financial Concerns: No; Food, Clothing or Shelter Needs: No; Support System Lacking: No; Transportation Concerns: No Electronic Signature(s) Signed: 09/20/2022 2:03:41 PM By: Kalman Shan DO Entered By: Kalman Shan on 09/20/2022 11:40:16 -------------------------------------------------------------------------------- SuperBill Details Patient Name: Date of Service: MAKAYLIN, CARLO [name] 09/20/2022 Medical Record Number: [id number] Patient Account Number: [id number] Date of Birth/Sex: Treating [name]: 1925-02-16 (88 y.o. [name], [name]: Marda Stalker Other Clinician: Referring Provider: Treating Provider/Extender: Aviva Signs in Treatment: 28 Diagnosis Coding ICD-10 Codes Code Description I87.311 Chronic venous hypertension (idiopathic) with ulcer of right lower extremity L97.812 Non-pressure chronic  ulcer of other part of right lower leg with fat layer exposed C50.919 Malignant neoplasm of unspecified site of unspecified female breast I50.42 Chronic combined systolic (congestive) and diastolic (congestive) heart failure G30.9 Alzheimer's disease, unspecified C54.1 Malignant neoplasm of endometrium Facility Procedures : CPT4 Code: [id number] Description: (Facility Use Only) [id number] - [place] LWR LT LEG Modifier: Quantity: 1 Physician Procedures : CPT4 Code Description Modifier [id number] - WC PHYS LEVEL 3 - EST PT ICD-10 Diagnosis Description I87.311 Chronic venous hypertension (idiopathic) with ulcer of right lower extremity L97.812 Non-pressure chronic ulcer of other part of right lower  leg with fat layer exposed I50.42 Chronic combined systolic (congestive) and diastolic (congestive) heart failure Quantity: 1 Electronic Signature(s) Signed: 09/20/2022 2:03:41 PM By: Kalman Shan DO Entered By: Kalman Shan on 09/20/2022 11:51:46

## 2022-09-22 DIAGNOSIS — R3915 Urgency of urination: Secondary | ICD-10-CM | POA: Diagnosis not present

## 2022-09-30 ENCOUNTER — Encounter (HOSPITAL_BASED_OUTPATIENT_CLINIC_OR_DEPARTMENT_OTHER): Payer: Medicare PPO | Attending: Internal Medicine | Admitting: Internal Medicine

## 2022-09-30 DIAGNOSIS — I87311 Chronic venous hypertension (idiopathic) with ulcer of right lower extremity: Secondary | ICD-10-CM | POA: Insufficient documentation

## 2022-09-30 DIAGNOSIS — G309 Alzheimer's disease, unspecified: Secondary | ICD-10-CM | POA: Insufficient documentation

## 2022-09-30 DIAGNOSIS — I890 Lymphedema, not elsewhere classified: Secondary | ICD-10-CM | POA: Diagnosis not present

## 2022-09-30 DIAGNOSIS — C541 Malignant neoplasm of endometrium: Secondary | ICD-10-CM | POA: Insufficient documentation

## 2022-09-30 DIAGNOSIS — Z9221 Personal history of antineoplastic chemotherapy: Secondary | ICD-10-CM | POA: Diagnosis not present

## 2022-09-30 DIAGNOSIS — Z853 Personal history of malignant neoplasm of breast: Secondary | ICD-10-CM | POA: Diagnosis not present

## 2022-09-30 DIAGNOSIS — L97812 Non-pressure chronic ulcer of other part of right lower leg with fat layer exposed: Secondary | ICD-10-CM | POA: Insufficient documentation

## 2022-09-30 DIAGNOSIS — I110 Hypertensive heart disease with heart failure: Secondary | ICD-10-CM | POA: Diagnosis not present

## 2022-09-30 DIAGNOSIS — C50919 Malignant neoplasm of unspecified site of unspecified female breast: Secondary | ICD-10-CM | POA: Insufficient documentation

## 2022-09-30 DIAGNOSIS — I5042 Chronic combined systolic (congestive) and diastolic (congestive) heart failure: Secondary | ICD-10-CM | POA: Diagnosis not present

## 2022-09-30 DIAGNOSIS — I739 Peripheral vascular disease, unspecified: Secondary | ICD-10-CM | POA: Insufficient documentation

## 2022-09-30 DIAGNOSIS — M1990 Unspecified osteoarthritis, unspecified site: Secondary | ICD-10-CM | POA: Insufficient documentation

## 2022-09-30 DIAGNOSIS — Z8543 Personal history of malignant neoplasm of ovary: Secondary | ICD-10-CM | POA: Diagnosis not present

## 2022-09-30 NOTE — Progress Notes (Signed)
YULONDA, WHEELING ([id number]) [id number] Page 1 of 10 Visit Report for 09/30/2022 Chief Complaint Document Details Patient Name: Date of Service: [name], [name] 09/30/2022 11:30 A M Medical Record Number: [id number] Patient Account Number: [id number] Date of Birth/Sex: Treating RN: 1925/01/12 (88 y.o. F) Primary Care Provider: Marda Stalker Other Clinician: Referring Provider: Treating Provider/Extender: Aviva Signs in Treatment: 01 Information Obtained from: Patient Chief Complaint 11/27/2021; Left lower extremity wound 08/26/2022; right lower extremity wound Electronic Signature(s) Signed: 09/30/2022 1:36:02 PM By: Kalman Shan DO Entered By: Kalman Shan on 09/30/2022 13:32:11 -------------------------------------------------------------------------------- HPI Details Patient Name: Date of Service: [name], [name] 09/30/2022 11:30 A M Medical Record Number: [id number] Patient Account Number: [id number] Date of Birth/Sex: Treating RN: 07-Jul-1925 (88 y.o. F) Primary Care Provider: Marda Stalker Other Clinician: Referring Provider: Treating Provider/Extender: Aviva Signs in Treatment: 17 History of Present Illness HPI Description: this is a patient we know from several prior wounds on her bilateral lower extremities. She has venous stasis physiology, inflammation and hypertension. She is been fully evaluated for venous ablation and was found not to be a candidate. She does not have significant arterial disease. 02/14/15 the wound itself on her lateral left leg did not look too bad however there was surrounding maceration which was concerning 02/27/15 the wound itself is small with surrounding circumferential epithelialization there is no surrounding maceration. 03/06/15 only a very small area remains. I think this is mostly epithelialized however I would be uncomfortable not dressing  this this week 03/13/15 the area is epithelialized. There was a thick surface on this. I took a #15 blade then lightly disrupted it but there does not appear to be any open area I did not see anything but appropriate epithelium Readmission: 08/16/18 on evaluation today patient presents for initial evaluation and our clinic released readmission although she has not been seen since April 2016. Nonetheless she is having issues with the ulcers currently on the bilateral lateral malleolus or locations as well as the left lateral lower extremity. These have been present for roughly 3 weeks since the patient actually developed significant bilateral lower extremity edema which patient's daughter states was roughly 3 times the size of her legs currently. Subsequently she ended up in the emerging department due to congestive heart failure. They were able to get the swelling under control and fortunately she seems to be doing much better at this point in time. She was placed in an [employer] which she sat on for week part evaluation today as well. Fortunately the wound areas actually seem to be doing fairly well there is some macerated skin surrounding there are the areas/surface of the wound which are gonna require some debridement at this point. No fevers, chills, nausea, or vomiting noted at this time. Patient has a history of hypertension, mild peripheral vascular disease, chronic venous stasis, and other than the ulcer seems to be doing fairly well today. No fevers, chills, nausea, or vomiting noted at this time. 08/23/18 on evaluation today patient actually appears to be doing great in regard to her bilateral lower should be ulcers. She's shown signs of improvement even just with one week with the [employer]. In general I'm actually very pleased with how things appear at this point. 09/06/18 on evaluation today patient actually appears to be doing much better her left lower extremity is completely  healed. Go to the right lower extremity lateral malleolus ulcer this still seems to be showing signs of being open.  There does not appear to be evidence of infection which is good news. This did require some sharp debridement however to remove some of the necrotic tissue/eschar on the surface of the wound Readmission 11/27/2021 [name] is a 88 year old female with a past medical history of endometrial and breast cancer, chronic venous insufficiency, dementia and essential hypertension that presents to the clinic with a 1 month history of nonhealing ulcer to the left lower extremity. She states that the ulcer opened 1 Govoni, Vira ([id number]) [id number] Page 2 of 10 month ago and then healed but has reopened again in the past week. She is currently keeping the area covered. She has a history of wounds to her lower extremities. She has compression stockings that she uses daily. She currently denies signs of infection. 1/13; patient presents for follow-up. She followed up for nurse visit for wrap change. She reports more odor. She denies pain. 1/20; patient presents for follow-up. She has been using gentamicin ointment with calcium alginate to the wound bed. She reports improvement in odor and drainage. She currently denies systemic signs of infection. 1/27; patient presents for follow-up. She has been using gentamicin ointment with calcium alginate. She received her Keystone antibiotic 2 days ago. She brought this in. Home health has also been established. They will come out for the first time next week. She currently denies signs of infection. She has no issues or complaints today. 2/2; patient presents for follow-up. She has been using Keystone antibiotics with [facility] with no issues. She denies signs of infection. She has no issues or complaints today. 2/9; patient presents for follow-up. She has been using Keystone antibiotics with [facility] with no issues. She has tolerated the compression wrap well. She denies signs of infection. 2/16; patient presents for follow-up. She has been using Keystone antibiotics with Hydrofera Blue under compression wrap with no issues. She currently denies signs of infection. 2/23; patient presents for follow-up. She continues to use Keystone antibiotics with Hydrofera Blue under the compression wrap. She has no issues or complaints today. She denies signs of infection. Home health continues to come out and change the wrap. 3/2; patient presents for follow-up. She continues to use [facility] with [facility] under compression with no issues. She denies signs of infection. 3/16; patient presents for follow-up. She continues to use [facility] with [facility] under compression with no issues. Home health change the dressing last week. She denies signs of infection. 3/23; patient presents for follow-up. She continues to use Keystone antibiotics with Hydrofera Blue under compression with no issues. Home health continues to change the dressings. She denies signs of infection. 3/30; patient presents for follow-up. She continues to use Keystone antibiotics with Hydrofera Blue under compression with no issues. Home health continues to change the dressings. 4/13 2-week follow-up. Keystone and Hydrofera Blue wound is roughly 2.3 cm in diameter smaller. No other issues 4/20; patient presents for follow-up. She continues to use Keystone antibiotic with [facility] with no issues. 4/28; patient with a difficult wound on her right medial lower leg. Using [facility] Blue minimal improvement per our intake nurse we are using compression. She has home health changing the dressing 5/5; patient presents for follow-up. We have been using Keystone antibiotics and Hydrofera Blue under compression therapy. She has no issues or complaints today. We discussed potentially using an advanced tissue product. She  states she has had this done in the past and would like to proceed to see what her  insurance would cover. 5/15; patient presents for follow-up. She has been using Keystone antibiotics and Hydrofera Blue under compression therapy. She forgot her Keystone antibiotics today. Insurance has not approved her for skin substitute. She currently denies signs of infection. She has home health that changes the dressing once a week. 5/19; patient presents for follow-up. We have been using Keystone antibiotic and Hydrofera Blue under compression therapy. She has no issues or complaints today. The [employer] rep confirmed over the phone that the patient has been 100% approved for their skin substitute. We will verify this as we have not received any paperwork stating this. Patient states she would like to proceed with a skin substitute if it is covered by insurance. 6/1; patient presents for follow-up. We have been using Keystone antibiotic and Hydrofera Blue under compression therapy. We confirmed that she does have a co-pay for the [employer] skin substitute. We will hold off on using this for now since she continues to show improvement with current therapy. She denies signs of infection. She has home health that comes out twice weekly. 6/16; patient presents for follow-up. We have been using Keystone antibiotic and Hydrofera Blue under compression therapy. She has no issues or complaints today. We discussed the potential trial of vendaje. This is placenta tissue. She states she would like to try the free trial. We will reach out to our rep. 6/30; patient presents for follow-up. She has been using Keystone antibiotic and Hydrofera Blue under compression therapy. She has been approved for a trial of vendaje. She would like to proceed with this and We will have this available at next clinic visit. She has no issues or complaints today. 7/18; patient presents for follow-up. She has been using Keystone antibiotic and  Hydrofera Blue under compression. She has no issues or complaints today. We have a free sample of vendaje skin substitute and patient is agreeable to having this placed today. 7/24; patient presents for follow-up. We placed the first free trial of vendaje last week under compression wrap. Patient had no issues or complaints today. She tolerated this well. 8/1; patient presents for follow-up. Vandaje #2 donated product was placed in standard fashion last week under compression therapy. She has no issues or complaints today. 8/8; patient presents for follow-up. Vendaje #3 donated skin set was placed in standard fashion last visit under compression therapy. She has no issues or complaints today. 8/15; Patient presents for follow-up. Vandaje #4 was placed in standard fashion last week under compression therapy. She has no issues or complaints today. 8/22; patient presents for follow-up. Vandaje #5 was placed in standard fashion last week under compression therapy. She tolerated this well. 8/29; patient presents for follow-up. We have been using Xeroform under compression therapy and she has tolerated this well. She has no issues or complaints today. She has home health that is been coming out weekly For dressing changes. 9/12; patient presents for follow-up. We have been using Xeroform under compression therapy. Home health has been changing the dressings. She has no issues or complaints today. 9/19; patient presents for follow-up. We have been using Xeroform under compression therapy. She has no issues or complaints today. 9/26; patient presents for follow-up. We have been using Xeroform under compression therapy. She has no issues or complaints today. Admission 08/26/2022 [name] is a 88 year old female that was recently discharged from our clinic last week due to a closed wound to the left lower extremity. [name], [name] ([id number]) [id number] Page 3 of  10 Unfortunately  she has developed a new wound to the right lower extremity. It appears that this is from trauma. 10/13; patient presents for follow-up. She tolerated the compression wrap well. She has no issues or complaints today. We have been using Santyl and Hydrofera Blue under the wrap. 10/20; patient presents for follow-up. We have been using Santyl and Hydrofera Blue under 3 layer compression. Her wound is healing nicely. She has no issues or complaints today. 10/30; patient presents for follow-up. We have been using endoform under 3 layer compression. Her wound is almost healed. 11/9; patient presents for follow-up. We have been using Xeroform under 3 layer compression. Her wound is healed. She has her compression stocking with her today. Electronic Signature(s) Signed: 09/30/2022 1:36:02 PM By: Kalman Shan DO Entered By: Kalman Shan on 09/30/2022 13:32:39 -------------------------------------------------------------------------------- Physical Exam Details Patient Name: Date of Service: [name], [name] 09/30/2022 11:30 A M Medical Record Number: [id number] Patient Account Number: [id number] Date of Birth/Sex: Treating RN: 1925-01-19 (88 y.o. F) Primary Care Provider: Marda Stalker Other Clinician: Referring Provider: Treating Provider/Extender: Aviva Signs in Treatment: 63 Constitutional respirations regular, non-labored and within target range for patient.. Cardiovascular 2+ dorsalis pedis/posterior tibialis pulses. Psychiatric pleasant and cooperative. Notes Right lower extremity: T the anterior aspect there is epithelization to the previous wound site. Good edema control. o Electronic Signature(s) Signed: 09/30/2022 1:36:02 PM By: Kalman Shan DO Entered By: Kalman Shan on 09/30/2022 13:33:41 -------------------------------------------------------------------------------- Physician Orders Details Patient Name: Date  of Service: SHANTAI, TIEDEMAN [name] 09/30/2022 11:30 A M Medical Record Number: [id number] Patient Account Number: [id number] Date of Birth/Sex: Treating RN: [birth year] (88 y.o. [name], [name]: Marda Stalker Other Clinician: Referring Provider: Treating Provider/Extender: Aviva Signs in Treatment: 39 Verbal / Phone Orders: No Diagnosis Coding Discharge From [facility] Services Discharge from [facility] - Lymphedema / SCD / Other Elevate legs to the level of the heart or above for 30 minutes daily and/or when sitting, a frequency of: Avoid standing for long periods of time. NOHELANI, BENNING ([id number]) [id number] Page 4 of 10 Patient to wear own compression stockings every day. Electronic Signature(s) Signed: 09/30/2022 1:36:02 PM By: Kalman Shan DO Entered By: Kalman Shan on 09/30/2022 13:33:49 -------------------------------------------------------------------------------- Problem List Details Patient Name: Date of Service: [name], [name] 09/30/2022 11:30 A M Medical Record Number: [id number] Patient Account Number: [id number] Date of Birth/Sex: Treating RN: 1925-05-31 (88 y.o. F) Primary Care Provider: Marda Stalker Other Clinician: Referring Provider: Treating Provider/Extender: Aviva Signs in Treatment: 5 Active Problems ICD-10 Encounter Code Description Active Date MDM Diagnosis I87.311 Chronic venous hypertension (idiopathic) with ulcer of right lower extremity 08/26/2022 No Yes L97.812 Non-pressure chronic ulcer of other part of right lower leg with fat layer 09/20/2022 No Yes exposed C50.919 Malignant neoplasm of unspecified site of unspecified female breast 11/27/2021 No Yes I50.42 Chronic combined systolic (congestive) and diastolic (congestive) heart failure 11/27/2021 No Yes G30.9 Alzheimer's disease, unspecified 11/27/2021 No  Yes C54.1 Malignant neoplasm of endometrium 11/27/2021 No Yes Inactive Problems Resolved Problems ICD-10 Code Description Active Date Resolved Date I87.312 Chronic venous hypertension (idiopathic) with ulcer of left lower extremity 11/27/2021 11/27/2021 X44.818 Non-pressure chronic ulcer of other part of left lower leg with fat layer exposed 11/27/2021 11/27/2021 Electronic Signature(s) Signed: 09/30/2022 1:36:02 PM By: Kalman Shan DO Entered By: Kalman Shan on 09/30/2022 13:31:58 [name] ([id number]) [id number] Page 5 of 10 -------------------------------------------------------------------------------- Progress Note Details Patient Name: Date of Service: Beier, [name]  09/30/2022 11:30 A M Medical Record Number: [id number] Patient Account Number: [id number] Date of Birth/Sex: Treating RN: 1925-05-21 (88 y.o. F) Primary Care Provider: Marda Stalker Other Clinician: Referring Provider: Treating Provider/Extender: Aviva Signs in Treatment: 90 Subjective Chief Complaint Information obtained from Patient 11/27/2021; Left lower extremity wound 08/26/2022; right lower extremity wound History of Present Illness (HPI) this is a patient we know from several prior wounds on her bilateral lower extremities. She has venous stasis physiology, inflammation and hypertension. She is been fully evaluated for venous ablation and was found not to be a candidate. She does not have significant arterial disease. 02/14/15 the wound itself on her lateral left leg did not look too bad however there was surrounding maceration which was concerning 02/27/15 the wound itself is small with surrounding circumferential epithelialization there is no surrounding maceration. 03/06/15 only a very small area remains. I think this is mostly epithelialized however I would be uncomfortable not dressing this this week 03/13/15 the area is epithelialized.  There was a thick surface on this. I took a #15 blade then lightly disrupted it but there does not appear to be any open area I did not see anything but appropriate epithelium Readmission: 08/16/18 on evaluation today patient presents for initial evaluation and our clinic released readmission although she has not been seen since April 2016. Nonetheless she is having issues with the ulcers currently on the bilateral lateral malleolus or locations as well as the left lateral lower extremity. These have been present for roughly 3 weeks since the patient actually developed significant bilateral lower extremity edema which patient's daughter states was roughly 3 times the size of her legs currently. Subsequently she ended up in the emerging department due to congestive heart failure. They were able to get the swelling under control and fortunately she seems to be doing much better at this point in time. She was placed in an [employer] which she sat on for week part evaluation today as well. Fortunately the wound areas actually seem to be doing fairly well there is some macerated skin surrounding there are the areas/surface of the wound which are gonna require some debridement at this point. No fevers, chills, nausea, or vomiting noted at this time. Patient has a history of hypertension, mild peripheral vascular disease, chronic venous stasis, and other than the ulcer seems to be doing fairly well today. No fevers, chills, nausea, or vomiting noted at this time. 08/23/18 on evaluation today patient actually appears to be doing great in regard to her bilateral lower should be ulcers. She's shown signs of improvement even just with one week with the [employer]. In general I'm actually very pleased with how things appear at this point. 09/06/18 on evaluation today patient actually appears to be doing much better her left lower extremity is completely healed. Go to the right lower extremity lateral  malleolus ulcer this still seems to be showing signs of being open. There does not appear to be evidence of infection which is good news. This did require some sharp debridement however to remove some of the necrotic tissue/eschar on the surface of the wound Readmission 11/27/2021 Ms. Kynzley Dowson is a 88 year old female with a past medical history of endometrial and breast cancer, chronic venous insufficiency, dementia and essential hypertension that presents to the clinic with a 1 month history of nonhealing ulcer to the left lower extremity. She states that the ulcer opened 1 month ago and then healed but has reopened  again in the past week. She is currently keeping the area covered. She has a history of wounds to her lower extremities. She has compression stockings that she uses daily. She currently denies signs of infection. 1/13; patient presents for follow-up. She followed up for nurse visit for wrap change. She reports more odor. She denies pain. 1/20; patient presents for follow-up. She has been using gentamicin ointment with calcium alginate to the wound bed. She reports improvement in odor and drainage. She currently denies systemic signs of infection. 1/27; patient presents for follow-up. She has been using gentamicin ointment with calcium alginate. She received her Keystone antibiotic 2 days ago. She brought this in. Home health has also been established. They will come out for the first time next week. She currently denies signs of infection. She has no issues or complaints today. 2/2; patient presents for follow-up. She has been using Keystone antibiotics with [facility] with no issues. She denies signs of infection. She has no issues or complaints today. 2/9; patient presents for follow-up. She has been using Keystone antibiotics with [facility] with no issues. She has tolerated the compression wrap well. She denies signs of infection. 2/16; patient presents for follow-up.  She has been using Keystone antibiotics with Hydrofera Blue under compression wrap with no issues. She currently denies signs of infection. 2/23; patient presents for follow-up. She continues to use Keystone antibiotics with Hydrofera Blue under the compression wrap. She has no issues or complaints today. She denies signs of infection. Home health continues to come out and change the wrap. 3/2; patient presents for follow-up. She continues to use [facility] with [facility] under compression with no issues. She denies signs of infection. 3/16; patient presents for follow-up. She continues to use [name] with [facility] under compression with no issues. Home health change the dressing last week. She denies signs of infection. 3/23; patient presents for follow-up. She continues to use Keystone antibiotics with Hydrofera Blue under compression with no issues. Home health continues to change the dressings. She denies signs of infection. 3/30; patient presents for follow-up. She continues to use Keystone antibiotics with Hydrofera Blue under compression with no issues. Home health continues to change the dressings. [name], [name] ([id number]) [id number] Page 6 of 10 4/13 2-week follow-up. Keystone and Hydrofera Blue wound is roughly 2.3 cm in diameter smaller. No other issues 4/20; patient presents for follow-up. She continues to use Keystone antibiotic with [facility] with no issues. 4/28; patient with a difficult wound on her right medial lower leg. Using [facility] Blue minimal improvement per our intake nurse we are using compression. She has home health changing the dressing 5/5; patient presents for follow-up. We have been using Keystone antibiotics and Hydrofera Blue under compression therapy. She has no issues or complaints today. We discussed potentially using an advanced tissue product. She states she has had this done in the past and would  like to proceed to see what her insurance would cover. 5/15; patient presents for follow-up. She has been using Keystone antibiotics and Hydrofera Blue under compression therapy. She forgot her Keystone antibiotics today. Insurance has not approved her for skin substitute. She currently denies signs of infection. She has home health that changes the dressing once a week. 5/19; patient presents for follow-up. We have been using Keystone antibiotic and Hydrofera Blue under compression therapy. She has no issues or complaints today. The [employer] rep confirmed over the phone that the patient has been 100% approved for their skin  substitute. We will verify this as we have not received any paperwork stating this. Patient states she would like to proceed with a skin substitute if it is covered by insurance. 6/1; patient presents for follow-up. We have been using Keystone antibiotic and Hydrofera Blue under compression therapy. We confirmed that she does have a co-pay for the Kerecis skin substitute. We will hold off on using this for now since she continues to show improvement with current therapy. She denies signs of infection. She has home health that comes out twice weekly. 6/16; patient presents for follow-up. We have been using Keystone antibiotic and Hydrofera Blue under compression therapy. She has no issues or complaints today. We discussed the potential trial of vendaje. This is placenta tissue. She states she would like to try the free trial. We will reach out to our rep. 6/30; patient presents for follow-up. She has been using Keystone antibiotic and Hydrofera Blue under compression therapy. She has been approved for a trial of vendaje. She would like to proceed with this and We will have this available at next clinic visit. She has no issues or complaints today. 7/18; patient presents for follow-up. She has been using Keystone antibiotic and Hydrofera Blue under compression. She has no issues or  complaints today. We have a free sample of vendaje skin substitute and patient is agreeable to having this placed today. 7/24; patient presents for follow-up. We placed the first free trial of vendaje last week under compression wrap. Patient had no issues or complaints today. She tolerated this well. 8/1; patient presents for follow-up. Vandaje #2 donated product was placed in standard fashion last week under compression therapy. She has no issues or complaints today. 8/8; patient presents for follow-up. Vendaje #3 donated skin set was placed in standard fashion last visit under compression therapy. She has no issues or complaints today. 8/15; Patient presents for follow-up. Vandaje #4 was placed in standard fashion last week under compression therapy. She has no issues or complaints today. 8/22; patient presents for follow-up. Vandaje #5 was placed in standard fashion last week under compression therapy. She tolerated this well. 8/29; patient presents for follow-up. We have been using Xeroform under compression therapy and she has tolerated this well. She has no issues or complaints today. She has home health that is been coming out weekly For dressing changes. 9/12; patient presents for follow-up. We have been using Xeroform under compression therapy. Home health has been changing the dressings. She has no issues or complaints today. 9/19; patient presents for follow-up. We have been using Xeroform under compression therapy. She has no issues or complaints today. 9/26; patient presents for follow-up. We have been using Xeroform under compression therapy. She has no issues or complaints today. Admission 08/26/2022 Ms. Anila Bojarski is a 88 year old female that was recently discharged from our clinic last week due to a closed wound to the left lower extremity. Unfortunately she has developed a new wound to the right lower extremity. It appears that this is from trauma. 10/13; patient presents  for follow-up. She tolerated the compression wrap well. She has no issues or complaints today. We have been using Santyl and Hydrofera Blue under the wrap. 10/20; patient presents for follow-up. We have been using Santyl and Hydrofera Blue under 3 layer compression. Her wound is healing nicely. She has no issues or complaints today. 10/30; patient presents for follow-up. We have been using endoform under 3 layer compression. Her wound is almost healed. 11/9; patient presents for follow-up.  We have been using Xeroform under 3 layer compression. Her wound is healed. She has her compression stocking with her today. Patient History Information obtained from Patient. Family History Cancer - Child, Hypertension - Child,Father, Kidney Disease - Child, Stroke - Siblings,Child, Thyroid Problems - Child, No family history of Diabetes, Heart Disease, Hereditary Spherocytosis, Lung Disease, Seizures, Tuberculosis. Social History Never smoker, Marital Status - Widowed, Alcohol Use - Never, Drug Use - No History, Caffeine Use - Never. Medical History Eyes Denies history of Cataracts, Glaucoma, Optic Neuritis Ear/Nose/Mouth/Throat Denies history of Chronic sinus problems/congestion, Middle ear problems Hematologic/Lymphatic Patient has history of Anemia Denies history of Hemophilia, Human Immunodeficiency Virus, Lymphedema, Sickle Cell Disease Respiratory Denies history of Aspiration, Asthma, Chronic Obstructive Pulmonary Disease (COPD), Pneumothorax, Sleep Apnea, Tuberculosis Cardiovascular [name], [name] ([id number]) [id number] Page 7 of 10 Patient has history of Congestive Heart Failure, Hypertension, Peripheral Venous Disease Denies history of Angina, Arrhythmia, Coronary Artery Disease, Hypotension, Myocardial Infarction, Peripheral Arterial Disease, Phlebitis, Vasculitis Gastrointestinal Denies history of Cirrhosis , Colitis, Crohnoos, Hepatitis A, Hepatitis B,  Hepatitis C Endocrine Denies history of Type I Diabetes, Type II Diabetes Genitourinary Denies history of End Stage Renal Disease Immunological Denies history of Lupus Erythematosus, Raynaudoos, Scleroderma Integumentary (Skin) Denies history of History of Burn Musculoskeletal Patient has history of Osteoarthritis Denies history of Gout, Rheumatoid Arthritis, Osteomyelitis Neurologic Denies history of Neuropathy, Quadriplegia, Paraplegia, Seizure Disorder Oncologic Patient has history of Received Chemotherapy - Breast and Ovarian Cancer Denies history of Received Radiation Hospitalization/Surgery History - diverticulits. Objective Constitutional respirations regular, non-labored and within target range for patient.. Vitals Time Taken: 11:40 AM, Height: 66 in, Weight: 176 lbs, BMI: 28.4, Temperature: 97.7 F, Pulse: 62 bpm, Respiratory Rate: 18 breaths/min, Blood Pressure: 150/77 mmHg. Cardiovascular 2+ dorsalis pedis/posterior tibialis pulses. Psychiatric pleasant and cooperative. General Notes: Right lower extremity: T the anterior aspect there is epithelization to the previous wound site. Good edema control. o Integumentary (Hair, Skin) Wound #27 status is Open. Original cause of wound was Gradually Appeared. The date acquired was: 08/23/2022. The wound has been in treatment 5 weeks. The wound is located on the Right,Anterior Lower Leg. The wound measures 0cm length x 0cm width x 0cm depth; 0cm^2 area and 0cm^3 volume. There is no tunneling or undermining noted. There is a none present amount of drainage noted. The wound margin is flat and intact. There is no granulation within the wound bed. There is no necrotic tissue within the wound bed. The periwound skin appearance had no abnormalities noted for moisture. The periwound skin appearance had no abnormalities noted for color. The periwound skin appearance exhibited: Scarring. The periwound skin appearance did not exhibit:  Callus, Crepitus, Excoriation, Induration, Rash. Periwound temperature was noted as No Abnormality. The periwound has tenderness on palpation. Assessment Active Problems ICD-10 Chronic venous hypertension (idiopathic) with ulcer of right lower extremity Non-pressure chronic ulcer of other part of right lower leg with fat layer exposed Malignant neoplasm of unspecified site of unspecified female breast Chronic combined systolic (congestive) and diastolic (congestive) heart failure Alzheimer's disease, unspecified Malignant neoplasm of endometrium Patient has done well with Xeroform under compression therapy. Her wound is healed. I recommended wearing compression stockings daily. Follow-up as needed. Plan Discharge From [facility] Services: Discharge from [place] Edema Control - Lymphedema / [place] / OtherACCALIA, RIGDON ([id number]) [id number] Page 8 of 10 Elevate legs to the level of the heart or above for 30 minutes daily and/or when sitting, a frequency of: Avoid standing for long periods  of time. Patient to wear own compression stockings every day. 1. Daily compression stocking 2. Discharge from clinic due to closed wound 3. Follow-up as needed Electronic Signature(s) Signed: 09/30/2022 1:36:02 PM By: Kalman Shan DO Entered By: Kalman Shan on 09/30/2022 13:34:58 -------------------------------------------------------------------------------- HxROS Details Patient Name: Date of Service: [name], [name] 09/30/2022 11:30 A M Medical Record Number: [id number] Patient Account Number: [id number] Date of Birth/Sex: Treating RN: 04/01/1925 (88 y.o. F) Primary Care Provider: Marda Stalker Other Clinician: Referring Provider: Treating Provider/Extender: Aviva Signs in Treatment: 43 Information Obtained From Patient Eyes Medical History: Negative for: Cataracts; Glaucoma; Optic  Neuritis Ear/Nose/Mouth/Throat Medical History: Negative for: Chronic sinus problems/congestion; Middle ear problems Hematologic/Lymphatic Medical History: Positive for: Anemia Negative for: Hemophilia; Human Immunodeficiency Virus; Lymphedema; Sickle Cell Disease Respiratory Medical History: Negative for: Aspiration; Asthma; Chronic Obstructive Pulmonary Disease (COPD); Pneumothorax; Sleep Apnea; Tuberculosis Cardiovascular Medical History: Positive for: Congestive Heart Failure; Hypertension; Peripheral Venous Disease Negative for: Angina; Arrhythmia; Coronary Artery Disease; Hypotension; Myocardial Infarction; Peripheral Arterial Disease; Phlebitis; Vasculitis Gastrointestinal Medical History: Negative for: Cirrhosis ; Colitis; Crohns; Hepatitis A; Hepatitis B; Hepatitis C Endocrine Medical History: Negative for: Type I Diabetes; Type II Diabetes Genitourinary Medical History: Negative for: End Stage Renal Disease [name], JUAREZ ([id number]) [id number] Page 9 of 10 Immunological Medical History: Negative for: Lupus Erythematosus; Raynauds; Scleroderma Integumentary (Skin) Medical History: Negative for: History of Burn Musculoskeletal Medical History: Positive for: Osteoarthritis Negative for: Gout; Rheumatoid Arthritis; Osteomyelitis Neurologic Medical History: Negative for: Neuropathy; Quadriplegia; Paraplegia; Seizure Disorder Oncologic Medical History: Positive for: Received Chemotherapy - Breast and Ovarian Cancer Negative for: Received Radiation Immunizations Pneumococcal Vaccine: Received Pneumococcal Vaccination: Yes Received Pneumococcal Vaccination On or After 60th Birthday: Yes Implantable Devices None Hospitalization / Surgery History Type of Hospitalization/Surgery diverticulits Family and Social History Cancer: Yes - Child; Diabetes: No; Heart Disease: No; Hereditary Spherocytosis: No; Hypertension: Yes -  Child,Father; Kidney Disease: Yes - Child; Lung Disease: No; Seizures: No; Stroke: Yes - Siblings,Child; Thyroid Problems: Yes - Child; Tuberculosis: No; Never smoker; Marital Status - Widowed; Alcohol Use: Never; Drug Use: No History; Caffeine Use: Never; Financial Concerns: No; Food, Clothing or Shelter Needs: No; Support System Lacking: No; Transportation Concerns: No Electronic Signature(s) Signed: 09/30/2022 1:36:02 PM By: Kalman Shan DO Entered By: Kalman Shan on 09/30/2022 13:32:49 -------------------------------------------------------------------------------- SuperBill Details Patient Name: Date of Service: [place], [place] 09/30/2022 Medical Record Number: [id number] Patient Account Number: [id number] Date of Birth/Sex: Treating RN: 04-29-1925 (88 y.o. [name], [name]: Marda Stalker Other Clinician: Referring Provider: Treating Provider/Extender: Aviva Signs in Treatment: 50 Diagnosis Coding ICD-10 Codes Code Description I87.311 Chronic venous hypertension (idiopathic) with ulcer of right lower extremity L97.812 Non-pressure chronic ulcer of other part of right lower leg with fat layer exposed C50.919 Malignant neoplasm of unspecified site of unspecified female breast I50.42 Chronic combined systolic (congestive) and diastolic (congestive) heart failure Hemminger, Dajanique ([id number]) [id number] Page 10 of 10 G30.9 Alzheimer's disease, unspecified C54.1 Malignant neoplasm of endometrium Facility Procedures : CPT4 Code: 89169450 Description: 38882 - WOUND CARE VISIT-LEV 3 EST PT Modifier: Quantity: 1 Physician Procedures : CPT4 Code Description Modifier [id number] - WC PHYS LEVEL 3 - EST PT ICD-10 Diagnosis Description I87.311 Chronic venous hypertension (idiopathic) with ulcer of right lower extremity L97.812 Non-pressure chronic ulcer of other part of right lower  leg  with fat layer exposed I50.42 Chronic combined systolic (congestive) and diastolic (congestive) heart failure G30.9 Alzheimer's disease, unspecified Quantity: 1 Electronic Signature(s) Signed: 09/30/2022  1:36:02 PM By: Kalman Shan DO Entered By: Kalman Shan on 09/30/2022 13:35:20

## 2022-10-04 NOTE — Progress Notes (Signed)
[name], [name] ([id number]) [id number] Page 1 of 8 Visit Report for 09/30/2022 Arrival Information Details Patient Name: Date of Service: [name], [name] 09/30/2022 11:30 A M Medical Record Number: [id number] Patient Account Number: [id number] Date of Birth/Sex: Treating RN: 1925-02-16 (88 y.o. [name] Primary Care Marquay Kruse: Marda Stalker Other Clinician: Referring Cyrene Gharibian: Treating Jemari Hallum/Extender: Aviva Signs in Treatment: 50 Visit Information History Since Last Visit Added or deleted any medications: No Patient Arrived: Wheel Chair Any new allergies or adverse reactions: No Arrival Time: 11:38 Had a fall or experienced change in No Accompanied By: daughter activities of daily living that may affect Transfer Assistance: None risk of falls: Patient Identification Verified: Yes Signs or symptoms of abuse/neglect since last visito No Secondary Verification Process Completed: Yes Hospitalized since last visit: No Patient Requires Transmission-Based Precautions: No Implantable device outside of the clinic excluding No Patient Has Alerts: No cellular tissue based products placed in the center since last visit: Has Dressing in Place as Prescribed: Yes Pain Present Now: No Electronic Signature(s) Signed: 09/30/2022 3:49:20 PM By: Adline Peals Entered By: Adline Peals on 09/30/2022 11:40:20 -------------------------------------------------------------------------------- Clinic Level of Care Assessment Details Patient Name: Date of Service: [name], [name] 09/30/2022 11:30 A M Medical Record Number: [id number] Patient Account Number: [id number] Date of Birth/Sex: Treating RN: [birth year] (88 y.o. [name], [name] Primary Care Luan Maberry: Marda Stalker Other Clinician: Referring Yania Bogie: Treating Iriel Nason/Extender: Aviva Signs in Treatment: 40 Clinic  Level of Care Assessment Items TOOL 4 Quantity Score X- 1 0 Use when only an EandM is performed on FOLLOW-UP visit ASSESSMENTS - Nursing Assessment / Reassessment X- 1 10 Reassessment of Co-morbidities (includes updates in patient status) X- 1 5 Reassessment of Adherence to Treatment Plan ASSESSMENTS - Wound and Skin A ssessment / Reassessment X - Simple Wound Assessment / Reassessment - one wound 1 5 '[]'$  - 0 Complex Wound Assessment / Reassessment - multiple wounds '[]'$  - 0 Dermatologic / Skin Assessment (not related to wound area) ASSESSMENTS - Focused Assessment X- 1 5 Circumferential Edema Measurements - multi extremities '[]'$  - 0 Nutritional Assessment / Counseling / Intervention [name], [name] ([id number]) [id number] Page 2 of 8 '[]'$  - 0 Lower Extremity Assessment (monofilament, tuning fork, pulses) '[]'$  - 0 Peripheral Arterial Disease Assessment (using hand held doppler) ASSESSMENTS - Ostomy and/or Continence Assessment and Care '[]'$  - 0 Incontinence Assessment and Management '[]'$  - 0 Ostomy Care Assessment and Management (repouching, etc.) PROCESS - Coordination of Care X - Simple Patient / Family Education for ongoing care 1 15 '[]'$  - 0 Complex (extensive) Patient / Family Education for ongoing care X- 1 10 Staff obtains Programmer, systems, Records, T Results / Process Orders est '[]'$  - 0 Staff telephones HHA, Nursing Homes / Clarify orders / etc '[]'$  - 0 Routine Transfer to another Facility (non-emergent condition) '[]'$  - 0 Routine Hospital Admission (non-emergent condition) '[]'$  - 0 New Admissions / Biomedical engineer / Ordering NPWT Apligraf, etc. , '[]'$  - 0 Emergency Hospital Admission (emergent condition) X- 1 10 Simple Discharge Coordination '[]'$  - 0 Complex (extensive) Discharge Coordination PROCESS - Special Needs '[]'$  - 0 Pediatric / Minor Patient Management '[]'$  - 0 Isolation Patient Management '[]'$  - 0 Hearing / Language / Visual special  needs '[]'$  - 0 Assessment of Community assistance (transportation, D/C planning, etc.) '[]'$  - 0 Additional assistance / Altered mentation '[]'$  - 0 Support Surface(s) Assessment (bed, cushion, seat, etc.) INTERVENTIONS - Wound Cleansing / Measurement X - Simple Wound Cleansing - one wound  1 5 '[]'$  - 0 Complex Wound Cleansing - multiple wounds X- 1 5 Wound Imaging (photographs - any number of wounds) '[]'$  - 0 Wound Tracing (instead of photographs) X- 1 5 Simple Wound Measurement - one wound '[]'$  - 0 Complex Wound Measurement - multiple wounds INTERVENTIONS - Wound Dressings '[]'$  - 0 Small Wound Dressing one or multiple wounds X- 1 15 Medium Wound Dressing one or multiple wounds '[]'$  - 0 Large Wound Dressing one or multiple wounds X- 1 5 Application of Medications - topical '[]'$  - 0 Application of Medications - injection INTERVENTIONS - Miscellaneous '[]'$  - 0 External ear exam '[]'$  - 0 Specimen Collection (cultures, biopsies, blood, body fluids, etc.) '[]'$  - 0 Specimen(s) / Culture(s) sent or taken to Lab for analysis '[]'$  - 0 Patient Transfer (multiple staff / Civil Service fast streamer / Similar devices) '[]'$  - 0 Simple Staple / Suture removal (25 or less) '[]'$  - 0 Complex Staple / Suture removal (26 or more) '[]'$  - 0 Hypo / Hyperglycemic Management (close monitor of Blood Glucose) [name], [name] ([id number]) [id number] Page 3 of 8 '[]'$  - 0 Ankle / Brachial Index (ABI) - do not check if billed separately X- 1 5 Vital Signs Has the patient been seen at the hospital within the last three years: Yes Total Score: 100 Level Of Care: New/Established - Level 3 Electronic Signature(s) Signed: 10/04/2022 4:10:29 PM By: [name] Entered By: Rhae Hammock on 09/30/2022 12:11:54 -------------------------------------------------------------------------------- Encounter Discharge Information Details Patient Name: Date of Service: [name], [name] 09/30/2022 11:30 A  M Medical Record Number: [id number] Patient Account Number: [id number] Date of Birth/Sex: Treating RN: 07-12-1925 (88 y.o. [name], [name] Primary Care Zyon Rosser: Marda Stalker Other Clinician: Referring Jarone Ostergaard: Treating Saulo Anthis/Extender: Aviva Signs in Treatment: 25 Encounter Discharge Information Items Discharge Condition: Stable Ambulatory Status: Wheelchair Discharge Destination: Home Transportation: Private Auto Accompanied By: daughter Schedule Follow-up Appointment: Yes Clinical Summary of Care: Patient Declined Electronic Signature(s) Signed: 10/04/2022 4:10:29 PM By: [name] Entered By: Rhae Hammock on 09/30/2022 12:12:32 -------------------------------------------------------------------------------- Lower Extremity Assessment Details Patient Name: Date of Service: ALLIANA, MCAULIFF [name] 09/30/2022 11:30 A M Medical Record Number: [id number] Patient Account Number: [id number] Date of Birth/Sex: Treating RN: 1925-05-13 (88 y.o. [name] Primary Care Lunah Losasso: Marda Stalker Other Clinician: Referring Overton Boggus: Treating Monte Zinni/Extender: Marjo Bicker Weeks in Treatment: 73 Edema Assessment Assessed: [Left: No] [Right: No] Edema: [Left: N] [Right: o] Calf Left: Right: Point of Measurement: 30 cm From Medial Instep 31.5 cm Ankle Left: Right: Point of Measurement: 9 cm From Medial Instep 21 cm Vascular Assessment Corsi, Marquetta ([id number]) [Right:[id number] Page 4 of 8] Pulses: Dorsalis Pedis Palpable: [Right:Yes] Electronic Signature(s) Signed: 09/30/2022 3:49:20 PM By: Adline Peals Entered By: Adline Peals on 09/30/2022 11:45:16 -------------------------------------------------------------------------------- Multi Wound Chart Details Patient Name: Date of Service: [name], [name] 09/30/2022 11:30 A M Medical Record Number:  [id number] Patient Account Number: [id number] Date of Birth/Sex: Treating RN: 05-21-1925 (88 y.o. F) Primary Care Roe Koffman: Marda Stalker Other Clinician: Referring Dina Warbington: Treating Rochell Puett/Extender: Aviva Signs in Treatment: 61 Vital Signs Height(in): 1 Pulse(bpm): 59 Weight(lbs): 176 Blood Pressure(mmHg): 150/77 Body Mass Index(BMI): 28.4 Temperature(F): 97.7 Respiratory Rate(breaths/min): 18 [27:Photos:] [N/A:N/A] Right, Anterior Lower Leg N/A N/A Wound Location: Gradually Appeared N/A N/A Wounding Event: Venous Leg Ulcer N/A N/A Primary Etiology: Anemia, Congestive Heart Failure, N/A N/A Comorbid History: Hypertension, Peripheral Venous Disease, Osteoarthritis, Received Chemotherapy 08/23/2022 N/A N/A Date Acquired: 5 N/A N/A Weeks of Treatment: Open N/A N/A Wound  Status: No N/A N/A Wound Recurrence: 0x0x0 N/A N/A Measurements L x W x D (cm) 0 N/A N/A A (cm) : rea 0 N/A N/A Volume (cm) : 100.00% N/A N/A % Reduction in Area: 100.00% N/A N/A % Reduction in Volume: Full Thickness With Exposed Support N/A N/A Classification: Structures None Present N/A N/A Exudate Amount: Flat and Intact N/A N/A Wound Margin: None Present (0%) N/A N/A Granulation Amount: None Present (0%) N/A N/A Necrotic Amount: Fascia: No N/A N/A Exposed Structures: Fat Layer (Subcutaneous Tissue): No Tendon: No Muscle: No Joint: No Bone: No Large (67-100%) N/A N/A Epithelialization: Scarring: Yes N/A N/A Periwound Skin Texture: Excoriation: No Induration: No Callus: No Crepitus: No Rash: No DAVISON, Jalayiah ([id number]) [id number] Page 5 of 8 Maceration: No N/A N/A Periwound Skin Moisture: Dry/Scaly: No Atrophie Blanche: No N/A N/A Periwound Skin Color: Cyanosis: No Ecchymosis: No Erythema: No Hemosiderin Staining: No Mottled: No Pallor: No Rubor: No No Abnormality N/A N/A Temperature: Yes N/A  N/A Tenderness on Palpation: Treatment Notes Wound #27 (Lower Leg) Wound Laterality: Right, Anterior Cleanser Peri-Wound Care Topical Primary Dressing Secondary Dressing Secured With Compression Wrap Compression Stockings Add-Ons Electronic Signature(s) Signed: 09/30/2022 1:36:02 PM By: Kalman Shan DO Entered By: Kalman Shan on 09/30/2022 13:32:04 -------------------------------------------------------------------------------- Multi-Disciplinary Care Plan Details Patient Name: Date of Service: [place] 09/30/2022 11:30 A M Medical Record Number: [id number] Patient Account Number: [id number] Date of Birth/Sex: Treating RN: 17-Mar-1925 (88 y.o. [name], [name] Primary Care Sora Olivo: Marda Stalker Other Clinician: Referring Effie Wahlert: Treating Christipher Rieger/Extender: Marjo Bicker Weeks in Treatment: 62 Active Inactive Electronic Signature(s) Signed: 10/04/2022 4:10:29 PM By: [name] Entered By: Rhae Hammock on 09/30/2022 12:10:23 -------------------------------------------------------------------------------- Pain Assessment Details Patient Name: Date of Service: [name], [name] 09/30/2022 11:30 A M Medical Record Number: [id number] Patient Account Number: [id number] Date of Birth/Sex: Treating RN: February 27, 1925 (88 y.o. [name] Primary Care Syre Knerr: Marda Stalker Other Clinician: [name] ([id number]) [id number] Page 6 of 8 Referring Ruby Dilone: Treating Karlisha Mathena/Extender: Aviva Signs in Treatment: 23 Active Problems Location of Pain Severity and Description of Pain Patient Has Paino No Site Locations Rate the pain. Current Pain Level: 0 Pain Management and Medication Current Pain Management: Electronic Signature(s) Signed: 09/30/2022 3:49:20 PM By: Adline Peals Entered By: Adline Peals on 09/30/2022  11:40:38 -------------------------------------------------------------------------------- Patient/Caregiver Education Details Patient Name: Date of Service: [name] 11/9/2023andnbsp11:30 A M Medical Record Number: [id number] Patient Account Number: [id number] Date of Birth/Gender: Treating RN: [birth year] (88 y.o. Benjaman Lobe Primary Care Physician: Marda Stalker Other Clinician: Referring Physician: Treating Physician/Extender: Aviva Signs in Treatment: 84 Education Assessment Education Provided To: Patient Education Topics Provided Wound/Skin Impairment: Methods: Explain/Verbal Responses: State content correctly Electronic Signature(s) Signed: 10/04/2022 4:10:29 PM By: [name] Entered By: Rhae Hammock on 09/30/2022 12:10:35 [name] ([id number]) [id number] Page 7 of 8 -------------------------------------------------------------------------------- Wound Assessment Details Patient Name: Date of Service: [name], [name] 09/30/2022 11:30 A M Medical Record Number: [id number] Patient Account Number: [id number] Date of Birth/Sex: Treating RN: Apr 08, 1925 (88 y.o. [name] Primary Care Yvanna Vidas: Marda Stalker Other Clinician: Referring Sidnie Swalley: Treating Erielle Gawronski/Extender: Marjo Bicker Weeks in Treatment: 14 Wound Status Wound Number: 27 Primary Venous Leg Ulcer Etiology: Wound Location: Right, Anterior Lower Leg Wound Open Wounding Event: Gradually Appeared Status: Date Acquired: 08/23/2022 Comorbid Anemia, Congestive Heart Failure, Hypertension, Peripheral Weeks Of Treatment: 5 History: Venous Disease, Osteoarthritis, Received Chemotherapy Clustered Wound: No Photos Wound Measurements Length: (cm) Width: (cm) Depth: (  cm) Area: (cm) Volume: (cm) 0 % Reduction in Area: 100% 0 % Reduction in Volume: 100% 0 Epithelialization:  Large (67-100%) 0 Tunneling: No 0 Undermining: No Wound Description Classification: Full Thickness With Exposed Support Wound Margin: Flat and Intact Exudate Amount: None Present Structures Foul Odor After Cleansing: No Slough/Fibrino No Wound Bed Granulation Amount: None Present (0%) Exposed Structure Necrotic Amount: None Present (0%) Fascia Exposed: No Fat Layer (Subcutaneous Tissue) Exposed: No Tendon Exposed: No Muscle Exposed: No Joint Exposed: No Bone Exposed: No Periwound Skin Texture Texture Color No Abnormalities Noted: No No Abnormalities Noted: Yes Callus: No Temperature / Pain Crepitus: No Temperature: No Abnormality Excoriation: No Tenderness on Palpation: Yes Induration: No Rash: No Scarring: Yes Moisture No Abnormalities Noted: Yes Electronic Signature(s) Signed: 09/30/2022 3:49:20 PM By: Aretha Parrot, Attie ([id number]) [id number] Page 8 of 8 Entered By: Adline Peals on 09/30/2022 11:46:42 -------------------------------------------------------------------------------- Vitals Details Patient Name: Date of Service: [name], [name] 09/30/2022 11:30 A M Medical Record Number: [id number] Patient Account Number: [id number] Date of Birth/Sex: Treating RN: 03/12/1925 (88 y.o. [name] Primary Care Antuan Limes: Marda Stalker Other Clinician: Referring Ileane Sando: Treating Jarryd Gratz/Extender: Aviva Signs in Treatment: 7 Vital Signs Time Taken: 11:40 Temperature (F): 97.7 Height (in): 66 Pulse (bpm): 62 Weight (lbs): 176 Respiratory Rate (breaths/min): 18 Body Mass Index (BMI): 28.4 Blood Pressure (mmHg): 150/77 Reference Range: 80 - 120 mg / dl Electronic Signature(s) Signed: 09/30/2022 3:49:20 PM By: Adline Peals Entered By: Adline Peals on 09/30/2022 11:40:31

## 2022-10-06 DIAGNOSIS — N1832 Chronic kidney disease, stage 3b: Secondary | ICD-10-CM | POA: Diagnosis not present

## 2022-10-12 ENCOUNTER — Ambulatory Visit: Payer: Medicare PPO | Admitting: Podiatry

## 2022-10-12 ENCOUNTER — Encounter: Payer: Self-pay | Admitting: Podiatry

## 2022-10-12 DIAGNOSIS — M79674 Pain in right toe(s): Secondary | ICD-10-CM

## 2022-10-12 DIAGNOSIS — B351 Tinea unguium: Secondary | ICD-10-CM

## 2022-10-12 DIAGNOSIS — M79675 Pain in left toe(s): Secondary | ICD-10-CM

## 2022-10-12 DIAGNOSIS — I739 Peripheral vascular disease, unspecified: Secondary | ICD-10-CM

## 2022-10-12 DIAGNOSIS — L84 Corns and callosities: Secondary | ICD-10-CM

## 2022-10-12 NOTE — Progress Notes (Signed)
  Subjective:  Patient ID: [name], female    DOB: 16-Apr-1925,  MRN: [id number]  [name] presents to clinic today for for diabetic foot evaluation and corn(s) left lower extremity and painful thick toenails that are difficult to trim. Painful toenails interfere with ambulation. Aggravating factors include wearing enclosed shoe gear. Pain is relieved with periodic professional debridement. Painful corns are aggravated when weightbearing when wearing enclosed shoe gear. Pain is relieved with periodic professional debridement.   Chief Complaint  Patient presents with   foot care    Patient is here for routine foot care, she is not diabetic, Pcp name is [name] last seen in October 2023   New problem(s): None.   Daughter is present during today's visit and states her mother's left leg wound has finally healed. They have to be very careful as not to traumatize her legs when donning her compression hose.  PCP is [name] Stalker, PA-C.  No Known Allergies  Review of Systems: Negative except as noted in the HPI.  Objective: No changes noted in today's physical examination.  [name] is a pleasant 88 y.o. female thin build in NAD. AAO x 3.  Vascular:  CFT <3 seconds b/l LE. Palpable DP pulse(s) b/l LE. Diminished PT pulse(s) b/l LE. Pedal hair absent. No pain with calf compression b/l. BK dressing LLE which is clean, dry and intact. Wearing compression stocking RLE.  Dermatological:  Pedal skin thin, shiny and atrophic b/l LE. No interdigital macerations noted b/l LE.   Hyperkeratosis L hallux and L 2nd toe resolved, but there is abutment of digits due to bunion deformity.  Musculoskeletal:  Normal muscle strength 5/5 to all lower extremity muscle groups bilaterally.   HAV with bunion deformity noted b/l LE.   Hammertoe(s) noted to the L 2nd toe. No pain, crepitus or joint limitation noted with ROM b/l LE.  Patient ambulates independently without  assistive aids.   Neurological:  Protective sensation intact 5/5 intact bilaterally with 10g monofilament b/l. Vibratory sensation intact b/l.   Assessment/Plan: 1. Pain due to onychomycosis of toenails of both feet   2. PAD (peripheral artery disease) ([place])     No orders of the defined types were placed in this encounter.   -Patient's family member present. All questions/concerns addressed on today's visit. -Toenails 1-5 b/l were debrided in length and girth with sterile nail nippers and dremel without iatrogenic bleeding.  -Continue padding to afftected digits daily for protection. -Patient/POA to call should there be question/concern in the interim.   Return in about 3 months (around 01/12/2023).  Marzetta Board, DPM

## 2022-10-27 ENCOUNTER — Telehealth: Payer: Self-pay | Admitting: Family Medicine

## 2022-10-27 MED ORDER — BREXPIPRAZOLE 1 MG PO TABS: 2.0000 mg | ORAL_TABLET | Freq: Every day | ORAL | 11 refills | Status: DC

## 2022-10-27 NOTE — Telephone Encounter (Signed)
Called daughter, [name]. Started seeing changes at beginning of last week in her mother. Went to PCP who checked labs and UA and they were ok. She is trying to chew all meds. They switched to chewable tablets for ones they could.  Her mother sleeps in very late. Gets up, rambling. Some nights, does not go to sleep. She no longer has mobility she had at last visit. Cannot no longer get up out of wheelchair and walk short distances. Having bowel/bladder incontinence. Uses briefs.   Requesting assistance in the home. Hard to bath/shower her. She does not follow directions well.  Uses commode. Uses wet wipes to clean her up. Wanting to know if there is apparatus they can use to clean her up better.  I checked AL,NP schedule and she does not have appt in near future to offer. Aware I will send to Amy Lomax,NP to review and see what recommendations she has.   I updated med list, pharmacy and allergy list while on the phone w/ daughter.

## 2022-10-27 NOTE — Telephone Encounter (Signed)
Pt daughter came into office, states they are having a really hard time as pt dementia has progressed. Pt will not eat, cannot use the bathroom on her own. Pt cannot comprehend anything at all, they are struggling to bathe/feed/medicate her. Pt also will not sleep at night.. Pt daughter states they are needing help. States medication is QUEtiapine (SEROQUEL) 25 MG tablet  not working at all anymore. Pt daughter would like a call with advise of what they are needing to do.  [phone]

## 2022-10-27 NOTE — Telephone Encounter (Signed)
Per Amy: "Unfortunately it sounds that her condition continues to decline. These are all typical with worsening dementia. I would recommend home health if they do not already have this in place. They may need to consider long term care placement. Her PCP should be able to assist with home health orders. I am happy to help as well but will require a face to face visit within 30 days. It is very difficult to get home health out to the home as they agencies are understaffed but I am more than willing to try. I would encourage 24/7 supervision. Safety precautions are priority. We could send a referral to geriatric psychiatry to assist with behavioral management if they wish. We could try switching Seroquel to Rexulti but I am hearing that this medication is not well covered. It is an antidepressant that was recently indicated for management of mood in dementia patients."  I called daughter back. Relayed AL,NP note. She will contact PCP about putting home health orders in. Declined placing her in long term care placement. States her and sister stay with her 24/7 and do not want to put her in facility. Also declined referral to geriatric psychiatry, did not feel this was best option for her as she will need to go to every appt with her. She was interested in changed her to [place]. Placed on hold and spoke with Amy. Per Amy, she would:  Decrease seroquel by '25mg'$  when she starts on Rexulti 0.'5mg'$  tablet/day. She will take Seroquel '25mg'$  po qd for 1 week then stop.  After 1 week of being on Rexulti 0.'5mg'$ , she will increase to '1mg'$  po qd x1 week, then '2mg'$  po qd thereafter as seen below. Daughter will wait to see if we can get insurance approval first before proceeding with weaning her off of Seroquel. Aware I will call back once I hear from insurance.

## 2022-10-27 NOTE — Telephone Encounter (Signed)
PA Rexulti submitted on covermymeds. Key: [id number]. Attached office notes for supporting documentation. Waiting on determination from [place].

## 2022-10-28 NOTE — Telephone Encounter (Signed)
Received fax from [facility] that [place] approved until 11/22/23.  Per covermymeds: "PA Case: [id number], Status: Approved, Coverage Starts on: 11/22/2021 12:00:00 AM, Coverage Ends on: 11/22/2023 12:00:00 AM. Questions? Contact [phone]."

## 2022-10-28 NOTE — Telephone Encounter (Signed)
Called daughter back. Advised PA approved for Rexulti. Explained instructions on how to taper off seroquel and start on Rexulti again. She wrote down instructions and read back correctly. She will f/u with pharmacy to fill medication and call if any questions arise.

## 2022-10-29 DIAGNOSIS — R8290 Unspecified abnormal findings in urine: Secondary | ICD-10-CM | POA: Diagnosis not present

## 2022-11-01 ENCOUNTER — Other Ambulatory Visit: Payer: Medicare PPO

## 2022-11-03 DIAGNOSIS — R443 Hallucinations, unspecified: Secondary | ICD-10-CM | POA: Diagnosis not present

## 2022-11-03 DIAGNOSIS — F0394 Unspecified dementia, unspecified severity, with anxiety: Secondary | ICD-10-CM | POA: Diagnosis not present

## 2022-11-03 DIAGNOSIS — F0393 Unspecified dementia, unspecified severity, with mood disturbance: Secondary | ICD-10-CM | POA: Diagnosis not present

## 2022-11-03 DIAGNOSIS — I5032 Chronic diastolic (congestive) heart failure: Secondary | ICD-10-CM | POA: Diagnosis not present

## 2022-11-03 DIAGNOSIS — I130 Hypertensive heart and chronic kidney disease with heart failure and stage 1 through stage 4 chronic kidney disease, or unspecified chronic kidney disease: Secondary | ICD-10-CM | POA: Diagnosis not present

## 2022-11-03 DIAGNOSIS — F03911 Unspecified dementia, unspecified severity, with agitation: Secondary | ICD-10-CM | POA: Diagnosis not present

## 2022-11-03 DIAGNOSIS — F0392 Unspecified dementia, unspecified severity, with psychotic disturbance: Secondary | ICD-10-CM | POA: Diagnosis not present

## 2022-11-03 DIAGNOSIS — N1832 Chronic kidney disease, stage 3b: Secondary | ICD-10-CM | POA: Diagnosis not present

## 2022-11-03 DIAGNOSIS — F32A Depression, unspecified: Secondary | ICD-10-CM | POA: Diagnosis not present

## 2022-11-10 NOTE — Telephone Encounter (Signed)
Received a PA renewal request on covermymeds. Key: [id number]. Received instant approval: "PA Case: [id number], Status: Approved, Coverage Starts on: 11/22/2021 12:00:00 AM, Coverage Ends on: 11/22/2023 12:00:00 AM. Questions? Contact [phone]."

## 2022-11-19 ENCOUNTER — Telehealth: Payer: Self-pay

## 2022-11-19 ENCOUNTER — Other Ambulatory Visit: Payer: Self-pay

## 2022-11-19 DIAGNOSIS — Z515 Encounter for palliative care: Secondary | ICD-10-CM

## 2022-11-19 NOTE — Telephone Encounter (Signed)
(  4:33 pm) PC SW completed an initial visit with patient. Clinical team to see patient on 11/23/22 '@10'$  am.

## 2022-11-19 NOTE — Progress Notes (Signed)
COMMUNITY PALLIATIVE CARE SW NOTE  PATIENT NAME: [name] DOB: 09/18/1925 MRN: [id number]  PRIMARY CARE PROVIDER: Marda Stalker, PA-C  RESPONSIBLE PARTY: There is no guarantor information entered for this encounter.  Palliative Care Encounter/Clinical Social Work (4:33 pm-4:50 pm)  Person (s) encountered:Patient's daughter-Lorraine  Purpose of the Encounter: Initial palliative encounter; scheduled follow-up visit with palliative care RN.  Assessment: LCSW completed a review and assessment of patient's family/social, mental health and medical history, allergies, medications, and health (functional) status, including patient self-reporting and a review of relevant consultative reports was completed today as part of a comprehensive evaluation by [facility] Work services.  Summary of Encounter: PC SW completed telephonic encounter with daughter to begin palliative care services. Daughter was provided education regarding palliative care services, role in care, visit frequency and was provided contact information. SW obtained a status update on patient.   Patient was evaluated by [facility] hospice on 11/11/22 and was not admitted due to not meeting eligibility criteria and was referred to palliative care. Her daughter advises that patient using a walker with two person assist-one to guide patient and the other behind her for safety as she is a fall risk. Patient has had 3 falls in the past three months. Patient is ambulated in transporter wheelchair to appointments. She is having increased confusion. Patient attempts to feed herself, but is confused as to what utensils to use and at times, confused about if she is supposed to eat or not. Patient is incontinent of B/B and wears depends with an insert. Patient is dependent for all personal care needs. Patient is sleeping most of the day. Her appetite has declined. Patient is eating less than 50% of meals,  when she eats. Her daughter report that she and her siblings have to cue and encourage patient to eat. She is having difficulty swallowing her pills, despite crushing some of them. Patient's weight is 166 lbs. She does have edema in her lower extremities. Patient has poor circulation. The daughter shared that patient displaying more irritable behavior as her condition declines and more care is needed.   Social Work Interventions Provided: PC SW scheduled a follow-up in home visit with PC RN, obtained a status update, provided education regarding palliative care services and obtained a verbal consent from daughter for palliative care services.   Collaboration/Coordination of Care:   -Patient currently receiving PT, RN & SW services through [facility].  -Patient scheduled to have an echocardiogram on 11/24/22 '@1'$ :30 pm -Patient scheduled to see [facility] SW on 11/23/22 @ 2:30 pm   Plan/Follow-up: Palliative care RN/SW team will see patient on 11/23/21 @ 10 am at her home.   SOCIAL HX: Patient was born in [place], [place]. She completed the 8th grad. Patient has been widowed for 59 years. She has six children (4 girls, 1 boy, 1 deceased). Patient retired from [employer]. Patient's faith is non-denomination. Patient has no advance directives or HCPOA. Her daughter [name] is primary caregiver and decision-maker for patient, but she and her sisters share caregiving duties for patient.  *Patient insurance source are [employer] and Medicare.  Social History   Tobacco Use   Smoking status: Never   Smokeless tobacco: Never  Substance Use Topics   Alcohol use: No   CODE STATUS: Full Code ADVANCED DIRECTIVES: No MOST FORM COMPLETE: No HOSPICE EDUCATION PROVIDED: Yes. Consult completed on 12/21, patient did not meet criteria.  Duration of encounter and documentation: 45 minutes.  [address] [place], [place]

## 2022-11-23 ENCOUNTER — Other Ambulatory Visit: Payer: Medicare PPO

## 2022-11-23 DIAGNOSIS — Z515 Encounter for palliative care: Secondary | ICD-10-CM

## 2022-11-23 NOTE — Progress Notes (Signed)
COMMUNITY PALLIATIVE CARE SW NOTE  PATIENT NAME: [name] DOB: 10-25-1925 MRN: [id number]  PRIMARY CARE PROVIDER: Marda Stalker, PA-C  RESPONSIBLE PARTY:  Acct ID - Guarantor Home Phone Work Phone Relationship Acct Type  [id number] TAKIA, [phone]  Self P/F     [place], [place], [place] [id number]   Palliative Care Clinical Social Work [employer]  Person (s) present: Patient, patient's daughters-[name], Hassan Rowan, and granddaughter  Purpose of the Visit: Joint visit with RN-[facility] for follow-up assessment.  Assessment: LCSW completed a review and assessment of patient's family/social, mental health and medical history, allergies, medications, and health (functional) status, including patient self-reporting and a review of relevant consultative reports was completed today as part of a comprehensive evaluation by [place] Work services.  Summary of Encounter/Visit: RN/SW completed an initial visit with patient. She was eating her breakfast when team arrived. Patient observed picking at her food. She requires cueing and encouragement to eat as patient forgets to use utensils. Her daughters advised that patient is eating at least two meals per day. She is also snacking on grapes, chips and popcorn. Patient is having intermittent confusion. She is napping more during the day and will become restless and agitated at night where she tries to get up and walk around the house. Patient is having intermittent incontinence and wears depends. Patient does not complain in pain. Patient ambulates with a walker with 1-2 person assist. The family utilizes a wheelchair outside the home. The family continue to make efforts to take her out for rides occasionally. However, the family is experiencing caregiver fatigue. Their goal is to keep patient in her her home and provide care to her.   Social Work Interventions Provided: Supportive  presence, supportive counseling, normalization of feelings, assessment of needs, comfort of patient, resources for day treatment and clarified options for care.   Collaboration/Coordination of Care:   SW provided education/resources for [facility]; recommendations to structure patient's day better to assist with evening agitation/restlessness, and RN will contact patient's doctor to inform him of agitation/restlessness for recommendations. Patient is receiving PT and CNA services through [facility].   Plan/Follow-up: PC to follow-up with patient on 12/21/22 @ 10 am.   SOCIAL HX: N/A Social History   Tobacco Use   Smoking status: Never   Smokeless tobacco: Never  Substance Use Topics   Alcohol use: No    CODE STATUS: Full code ADVANCED DIRECTIVES: No MOST FORM COMPLETE:  No HOSPICE EDUCATION PROVIDED: Yes  Duration of visit and documentation: 75 minutes  Jerrico Covello, LCSW

## 2022-11-23 NOTE — Progress Notes (Signed)
1004 Palliative Care Encounter Note  PATIENT NAME: [name] DOB: 05-06-1925 MRN: [id number]   PRIMARY CARE PROVIDER: Marda Stalker, PA-C REFERRING PROVIDER:   Marda Stalker ,PA-C                RN/SW team completed follow up visit in her home. Daughters and niece also present.   History of Present Illness: 89yo with history of bilater breast cancer 8527 & 7824, diastolic heart failure, CKD, and alzheimers. Cared for by her daughters, granddaughter, and niece.    Cognitive: Pt is alert. Oriented to person and place. Gets daughters names mixed up. Does not answer questions appropriately. Daughters report that agitation at night has been getting worse. RN committed to email PCP regarding increased issues at night for suggestions in care.  Appetite: Pt eating breakfast upon RN/SW arrival. Pt eating bacon with fingers. Daughters report that pt is able to feed herself most times, but is unable to use utensils without being told to. Daughters report that appetite has been continuing to decline and that pt does not use supplements like ensure or boost. Reports that pt drinks 11/2 -16 oz water bottles per day and that she drinks green tea at dinner time. Reports that she often times has difficulty swallowing pills. RN suggested putting small pills in spoon of applesauce for pt to swallow. Daughters also report that pt gets more confused and agitated at night. States, "sometimes she will ball her fist up at [place] and we can't make her do what she doesn't want to do." Reports that pt one night last week, wouldn't stay in bed and just walked around house in the dark with walker over and over.  Mobility: Pt noted to shuffle feet when walking and takes very short strides. Uses walker when walking with one and sometimes two family members walking with her as well. Uses wheelchair when she leaves the house- "a couple of times per week".  GI/GU: Daughters report that she wears pull ups and that she  has occasional urinary incontinence.   Goals of Care:Daughters report being tired and needing more help. Pilar Plate conversation had by [employer] and SW regarding lack of programs that give support for in home needs. Discussed with daughters that private pay for caregivers was an option as well as [facility] (contact info given by SW). RN encouraged family to get pt on a schedule and not let her doze often during the day. Active listening performed by RN and SW with reassurances and understanding given.   Palliative Care Vs Hospice- Role of palliative care explained to family along with visit frequency. Outlined difference between palliative care and hospice. Pt just recently had hospice eval and was not taken under care.   CODE STATUS: Full Code ADVANCED DIRECTIVES: No MOST FORM: No PPS:   Next Appt Scheduled For: 12/21/22 @ 10            Jacqulyn Cane, RN

## 2022-11-24 ENCOUNTER — Ambulatory Visit: Payer: Medicare PPO

## 2022-11-24 DIAGNOSIS — I5032 Chronic diastolic (congestive) heart failure: Secondary | ICD-10-CM | POA: Diagnosis not present

## 2022-11-24 DIAGNOSIS — I429 Cardiomyopathy, unspecified: Secondary | ICD-10-CM

## 2022-11-25 NOTE — Progress Notes (Signed)
[name], [name] ([id number]) [id number] Page 1 of 7 Visit Report for 09/20/2022 Arrival Information Details Patient Name: Date of Service: [name], [name] 09/20/2022 10:45 A M Medical Record Number: [id number] Patient Account Number: [id number] Date of Birth/Sex: Treating RN: 1925/01/01 (89 y.o. F) Primary Care Mikia Delaluz: Marda Stalker Other Clinician: Referring [name]: Treating Carols Clemence/Extender: Aviva Signs in Treatment: 1 Visit Information History Since Last Visit Added or deleted any medications: No Patient Arrived: Ambulatory Any new allergies or adverse reactions: No Arrival Time: 11:03 Had a fall or experienced change in No Accompanied By: Daughter activities of daily living that may affect Transfer Assistance: None risk of falls: Patient Identification Verified: Yes Signs or symptoms of abuse/neglect since last visito No Secondary Verification Process Completed: Yes Hospitalized since last visit: No Patient Requires Transmission-Based Precautions: No Implantable device outside of the clinic excluding No Patient Has Alerts: No cellular tissue based products placed in the center since last visit: Has Compression in Place as Prescribed: Yes Pain Present Now: No Electronic Signature(s) Signed: 09/20/2022 4:23:13 PM By: Erenest Blank Entered By: Erenest Blank on 09/20/2022 11:04:13 -------------------------------------------------------------------------------- Compression Therapy Details Patient Name: Date of Service: [name], [name] 09/20/2022 10:45 A M Medical Record Number: [id number] Patient Account Number: [id number] Date of Birth/Sex: Treating RN: June 09, 1925 (89 y.o. [name], [name] Primary Care Sireen Halk: Marda Stalker Other Clinician: Referring Tarrell Debes: Treating Winston Sobczyk/Extender: Aviva Signs in Treatment: 16 Compression Therapy Performed for Wound  Assessment: Wound #27 Right,Anterior Lower Leg Performed By: Clinician Rhae Hammock, RN Compression Type: Three Layer Post Procedure Diagnosis Same as Pre-procedure Electronic Signature(s) Signed: 11/24/2022 5:36:43 PM By: [name] Entered By: Rhae Hammock on 09/20/2022 11:37:34 Winsor, Hamsini ([id number]) [id number] Page 2 of 7 -------------------------------------------------------------------------------- Encounter Discharge Information Details Patient Name: Date of Service: [name], [name] 09/20/2022 10:45 A M Medical Record Number: [id number] Patient Account Number: [id number] Date of Birth/Sex: Treating RN: [birth year] (89 y.o. [name], [name] Primary Care Saide Lanuza: Marda Stalker Other Clinician: Referring Aedin Jeansonne: Treating Treshun Wold/Extender: Aviva Signs in Treatment: 20 Encounter Discharge Information Items Discharge Condition: Stable Ambulatory Status: Wheelchair Discharge Destination: Home Transportation: Private Auto Accompanied By: daughter Schedule Follow-up Appointment: Yes Clinical Summary of Care: Patient Declined Electronic Signature(s) Signed: 11/24/2022 5:36:43 PM By: [name] Entered By: Rhae Hammock on 09/20/2022 11:39:49 -------------------------------------------------------------------------------- Lower Extremity Assessment Details Patient Name: Date of Service: [name], [name] 09/20/2022 10:45 A M Medical Record Number: [id number] Patient Account Number: [id number] Date of Birth/Sex: Treating RN: 1925-10-20 (89 y.o. F) Primary Care Reice Bienvenue: Marda Stalker Other Clinician: Referring Beila Purdie: Treating Keishawn Darsey/Extender: Marjo Bicker Weeks in Treatment: 42 Edema Assessment Assessed: [Left: No] [Right: No] Edema: [Left: N] [Right: o] Calf Left: Right: Point of Measurement: 30 cm From Medial Instep 32.5  cm Ankle Left: Right: Point of Measurement: 9 cm From Medial Instep 22 cm Electronic Signature(s) Signed: 09/20/2022 4:23:13 PM By: Erenest Blank Entered By: Erenest Blank on 09/20/2022 11:17:57 -------------------------------------------------------------------------------- Multi Wound Chart Details Patient Name: Date of Service: [name], [name] 09/20/2022 10:45 A M Medical Record Number: [id number] Patient Account Number: [id number] Date of Birth/Sex: Treating RN: 1925-02-09 (89 y.o. F) Primary Care Sayeed Weatherall: Marda Stalker Other Clinician: Referring Tiffanye Hartmann: Treating Tequila Rottmann/Extender: Aviva Signs in Treatment: [address], [place] ([id number]) [id number] Page 3 of 7 Vital Signs Height(in): 66 Pulse(bpm): 65 Weight(lbs): 176 Blood Pressure(mmHg): 163/76 Body Mass Index(BMI): 28.4 Temperature(F): 97.7 Respiratory Rate(breaths/min): 18 [27:Photos:] [N/A:N/A] Right, Anterior Lower Leg N/A N/A Wound Location: Gradually Appeared N/A  N/A Wounding Event: Venous Leg Ulcer N/A N/A Primary Etiology: Anemia, Congestive Heart Failure, N/A N/A Comorbid History: Hypertension, Peripheral Venous Disease, Osteoarthritis, Received Chemotherapy 08/23/2022 N/A N/A Date Acquired: 3 N/A N/A Weeks of Treatment: Open N/A N/A Wound Status: No N/A N/A Wound Recurrence: 0.6x0.3x0.1 N/A N/A Measurements L x W x D (cm) 0.141 N/A N/A A (cm) : rea 0.014 N/A N/A Volume (cm) : 91.00% N/A N/A % Reduction in Area: 95.50% N/A N/A % Reduction in Volume: Full Thickness With Exposed Support N/A N/A Classification: Structures Medium N/A N/A Exudate Amount: Serosanguineous N/A N/A Exudate Type: red, brown N/A N/A Exudate Color: Distinct, outline attached N/A N/A Wound Margin: Medium (34-66%) N/A N/A Granulation Amount: Red, Pink N/A N/A Granulation Quality: Medium (34-66%) N/A N/A Necrotic Amount: Fat Layer  (Subcutaneous Tissue): Yes N/A N/A Exposed Structures: Fascia: No Tendon: No Muscle: No Joint: No Bone: No Small (1-33%) N/A N/A Epithelialization: Excoriation: No N/A N/A Periwound Skin Texture: Induration: No Callus: No Crepitus: No Rash: No Scarring: No Maceration: No N/A N/A Periwound Skin Moisture: Dry/Scaly: No Atrophie Blanche: No N/A N/A Periwound Skin Color: Cyanosis: No Ecchymosis: No Erythema: No Hemosiderin Staining: No Mottled: No Pallor: No Rubor: No No Abnormality N/A N/A Temperature: Yes N/A N/A Tenderness on Palpation: Compression Therapy N/A N/A Procedures Performed: Treatment Notes Wound #27 (Lower Leg) Wound Laterality: Right, Anterior Cleanser Soap and Water Discharge Instruction: May shower and wash wound with dial antibacterial soap and water prior to dressing change. Peri-Wound Care Topical EBONYE, READE ([id number]) [id number] Page 4 of 7 Primary Dressing Xeroform Occlusive Gauze Dressing, 4x4 in Discharge Instruction: Apply to wound bed as instructed Secondary Dressing Woven Gauze Sponge, Non-Sterile 4x4 in Discharge Instruction: Apply over primary dressing as directed. Secured With Compression Wrap ThreePress (3 layer compression wrap) Discharge Instruction: Apply three layer compression as directed. Compression Stockings Add-Ons Electronic Signature(s) Signed: 09/20/2022 2:03:41 PM By: Kalman Shan DO Entered By: Kalman Shan on 09/20/2022 11:39:33 -------------------------------------------------------------------------------- Multi-Disciplinary Care Plan Details Patient Name: Date of Service: [name], [name] 09/20/2022 10:45 A M Medical Record Number: [id number] Patient Account Number: [id number] Date of Birth/Sex: Treating RN: 10/24/1925 (89 y.o. [name], [name] Primary Care Virgina Deakins: Marda Stalker Other Clinician: Referring Erma Joubert: Treating Derrico Zhong/Extender: Aviva Signs in Treatment: 60 Active Inactive Wound/Skin Impairment Nursing Diagnoses: Impaired tissue integrity Goals: Patient/caregiver will verbalize understanding of skin care regimen Date Initiated: 11/27/2021 Target Resolution Date: 10/23/2022 Goal Status: Active Ulcer/skin breakdown will have a volume reduction of 30% by week 4 Date Initiated: 11/27/2021 Date Inactivated: 12/31/2021 Target Resolution Date: 12/25/2021 Unmet Reason: according to Goal Status: Unmet measurements has not reduced. Interventions: Assess patient/caregiver ability to obtain necessary supplies Assess patient/caregiver ability to perform ulcer/skin care regimen upon admission and as needed Assess ulceration(s) every visit Provide education on ulcer and skin care Treatment Activities: Topical wound management initiated : 11/27/2021 Notes: Electronic Signature(s) Signed: 11/24/2022 5:36:43 PM By: [name] Entered By: Rhae Hammock on 09/20/2022 11:33:27 Adan, Aubryanna ([id number]) [id number] Page 5 of 7 -------------------------------------------------------------------------------- Pain Assessment Details Patient Name: Date of Service: [name], [name] 09/20/2022 10:45 A M Medical Record Number: [id number] Patient Account Number: [id number] Date of Birth/Sex: Treating RN: Jul 06, 1925 (89 y.o. F) Primary Care Shenika Quint: Marda Stalker Other Clinician: Referring Ellinor Test: Treating Kimo Bancroft/Extender: Aviva Signs in Treatment: 50 Active Problems Location of Pain Severity and Description of Pain Patient Has Paino No Site Locations Pain Management and Medication Current Pain Management: Electronic Signature(s) Signed: 09/20/2022 4:23:13 PM By:  Erenest Blank Entered By: Erenest Blank on 09/20/2022 11:04:53 -------------------------------------------------------------------------------- Patient/Caregiver  Education Details Patient Name: Date of Service: [name], [name] 10/30/2023andnbsp10:45 A M Medical Record Number: [id number] Patient Account Number: [id number] Date of Birth/Gender: Treating RN: 03-14-1925 (89 y.o. [name], [name] Primary Care Physician: Marda Stalker Other Clinician: Referring Physician: Treating Physician/Extender: Aviva Signs in Treatment: 32 Education Assessment Education Provided To: Patient Education Topics Provided Wound/Skin Impairment: Methods: Explain/Verbal Responses: Reinforcements needed, State content correctly Griffin, Carlyle Basques ([id number]) [id number] Page 6 of 7 Electronic Signature(s) Signed: 11/24/2022 5:36:43 PM By: [name] Entered By: Rhae Hammock on 09/20/2022 11:22:44 -------------------------------------------------------------------------------- Wound Assessment Details Patient Name: Date of Service: [name], [name] 09/20/2022 10:45 A M Medical Record Number: [id number] Patient Account Number: [id number] Date of Birth/Sex: Treating RN: [birth year] (89 y.o. F) Primary Care Jourdain Guay: Marda Stalker Other Clinician: Referring Abigaile Rossie: Treating River Mckercher/Extender: Marjo Bicker Weeks in Treatment: 42 Wound Status Wound Number: 27 Primary Venous Leg Ulcer Etiology: Wound Location: Right, Anterior Lower Leg Wound Open Wounding Event: Gradually Appeared Status: Date Acquired: 08/23/2022 Comorbid Anemia, Congestive Heart Failure, Hypertension, Peripheral Weeks Of Treatment: 3 History: Venous Disease, Osteoarthritis, Received Chemotherapy Clustered Wound: No Photos Wound Measurements Length: (cm) 0.6 Width: (cm) 0.3 Depth: (cm) 0.1 Area: (cm) 0.141 Volume: (cm) 0.014 % Reduction in Area: 91% % Reduction in Volume: 95.5% Epithelialization: Small (1-33%) Tunneling: No Undermining: No Wound Description Classification: Full  Thickness With Exposed Suppor Wound Margin: Distinct, outline attached Exudate Amount: Medium Exudate Type: Serosanguineous Exudate Color: red, brown t Structures Foul Odor After Cleansing: No Slough/Fibrino Yes Wound Bed Granulation Amount: Medium (34-66%) Exposed Structure Granulation Quality: Red, Pink Fascia Exposed: No Necrotic Amount: Medium (34-66%) Fat Layer (Subcutaneous Tissue) Exposed: Yes Necrotic Quality: Adherent Slough Tendon Exposed: No Muscle Exposed: No Joint Exposed: No Bone Exposed: No Periwound Skin Texture Texture Color No Abnormalities Noted: No No Abnormalities Noted: No Callus: No Atrophie Blanche: No Crepitus: No Cyanosis: No Excoriation: No Ecchymosis: No [place], Trent ([id number]) [id number] Page 7 of 7 Induration: No Erythema: No Rash: No Hemosiderin Staining: No Scarring: No Mottled: No Pallor: No Moisture Rubor: No No Abnormalities Noted: No Dry / Scaly: No Temperature / Pain Maceration: No Temperature: No Abnormality Tenderness on Palpation: Yes Electronic Signature(s) Signed: 09/20/2022 4:23:13 PM By: Erenest Blank Entered By: Erenest Blank on 09/20/2022 11:17:35 -------------------------------------------------------------------------------- Vitals Details Patient Name: Date of Service: JAZZLYN, HUIZENGA [name] 09/20/2022 10:45 A M Medical Record Number: [id number] Patient Account Number: [id number] Date of Birth/Sex: Treating RN: Oct 24, 1925 (89 y.o. F) Primary Care Deretha Ertle: Marda Stalker Other Clinician: Referring Aneesa Romey: Treating Sinclair Alligood/Extender: Aviva Signs in Treatment: 41 Vital Signs Time Taken: 11:04 Temperature (F): 97.7 Height (in): 66 Pulse (bpm): 65 Weight (lbs): 176 Respiratory Rate (breaths/min): 18 Body Mass Index (BMI): 28.4 Blood Pressure (mmHg): 163/76 Reference Range: 80 - 120 mg / dl Electronic Signature(s) Signed: 09/20/2022 4:23:13  PM By: Erenest Blank Entered By: Erenest Blank on 09/20/2022 11:04:47

## 2022-11-26 ENCOUNTER — Other Ambulatory Visit: Payer: Self-pay

## 2022-11-26 ENCOUNTER — Telehealth: Payer: Self-pay

## 2022-11-26 DIAGNOSIS — I129 Hypertensive chronic kidney disease with stage 1 through stage 4 chronic kidney disease, or unspecified chronic kidney disease: Secondary | ICD-10-CM

## 2022-11-26 DIAGNOSIS — I5032 Chronic diastolic (congestive) heart failure: Secondary | ICD-10-CM

## 2022-11-26 NOTE — Progress Notes (Signed)
Done

## 2022-11-26 NOTE — Telephone Encounter (Signed)
810 am.  Incoming call from daughter requesting follow up regarding medication for agitation.  Secure message sent to [facility] team following patient.

## 2022-11-26 NOTE — Progress Notes (Signed)
Patient's daughter wanted me to let you know that patient has dementia and it has progressed to more aggressive and combative, so she wants you to be aware, for her next visit with you. Also, it will be a challenge, but they will attempt to take her to have her blood drawn.

## 2022-11-26 NOTE — Progress Notes (Signed)
Can you order and release blood work for patient. Daughter will be taking her on Monday.

## 2022-11-30 ENCOUNTER — Telehealth: Payer: Self-pay | Admitting: Family Medicine

## 2022-11-30 DIAGNOSIS — G309 Alzheimer's disease, unspecified: Secondary | ICD-10-CM

## 2022-11-30 DIAGNOSIS — R269 Unspecified abnormalities of gait and mobility: Secondary | ICD-10-CM

## 2022-11-30 NOTE — Telephone Encounter (Signed)
Pt daughter is calling. Stated Pt is in the sun downing stage and is not sleeping at all at night and she pulling stuff out and wandering. Pt is refuse to walk with her walker and will not listen to anyone. Stated pt is taking medication but it's not working.

## 2022-11-30 NOTE — Telephone Encounter (Signed)
I called patient. I spoke with patient's daughter [name]. The Rexulti is not helping. Patient is up all night agitated, destroying her room, not listening to direction, etc. Patient's daughters have to stay up with her all night ot make sure she is safe.   While patient was on quetiapine, these behaviors were not as bad.   Patient's family is agreeable to a geriatric psychiatry referral and have heard good things about Dr. Casimiro Needle.  Will discuss with Amy, NP and let the patient's daughters know her recommendations.

## 2022-12-01 ENCOUNTER — Telehealth: Payer: Self-pay | Admitting: Family Medicine

## 2022-12-01 ENCOUNTER — Other Ambulatory Visit: Payer: Self-pay | Admitting: Cardiology

## 2022-12-01 DIAGNOSIS — I729 Aneurysm of unspecified site: Secondary | ICD-10-CM | POA: Diagnosis not present

## 2022-12-01 MED ORDER — QUETIAPINE FUMARATE 25 MG PO TABS: 12.5000 mg | ORAL_TABLET | Freq: Every day | ORAL | 0 refills | Status: DC

## 2022-12-01 NOTE — Progress Notes (Signed)
Patient rescheduled her appointment.  [name] Petrovich Wing, DO, [facility]

## 2022-12-01 NOTE — Telephone Encounter (Signed)
Referral for psychiatry fax to [facility]. Phone: [phone], Fax: [phone]

## 2022-12-01 NOTE — Addendum Note (Signed)
Addended by: Darleen Crocker on: 12/01/2022 02:33 PM   Modules accepted: Orders

## 2022-12-01 NOTE — Telephone Encounter (Signed)
Called the pts daughter back and confirmed the patient is taking the Rexulti 2 mg BID. I advised that can take few weeks before it is effective. Advised that [name] is willing to restart the seroquel at a low dose, 12.5 mg at bedtime. Advised she also does feel a referral to pysch would be beneficial to make sure she is getting the proper treatment for her concerns r/t behavioral changes.  I did confirm that pt had [place] therapy set up. She is currently receiving HH PT and nurse 1x a week. They have recommended ST to come in and help assess swallowing. Palliative care is also following her care. She will start adding the seroquel 12.5 mg tonight and will await a call for referral.  [name]: What dose of Rexulti are they giving? She should be at '2mg'$ . It does take a few weeks to work so I don't expect that we would know at this time how much it is helping. We could restart low dose Seroquel at night in addition to continuing Rexulti '2mg'$  daily. I would not recommend exceeding 12.'5mg'$  at bedtime for now. I definitely feel psych is needed to help with next steps. Were they able to get with PCP regarding [place] orders? TY!

## 2022-12-02 ENCOUNTER — Ambulatory Visit: Payer: Medicare PPO | Admitting: Cardiology

## 2022-12-02 DIAGNOSIS — Z86718 Personal history of other venous thrombosis and embolism: Secondary | ICD-10-CM

## 2022-12-02 DIAGNOSIS — I5032 Chronic diastolic (congestive) heart failure: Secondary | ICD-10-CM

## 2022-12-02 DIAGNOSIS — I429 Cardiomyopathy, unspecified: Secondary | ICD-10-CM

## 2022-12-02 DIAGNOSIS — I10 Essential (primary) hypertension: Secondary | ICD-10-CM

## 2022-12-02 LAB — BASIC METABOLIC PANEL
BUN/Creatinine Ratio: 19 (ref 12–28)
BUN: 26 mg/dL (ref 10–36)
CO2: 26 mmol/L (ref 20–29)
Calcium: 9.9 mg/dL (ref 8.7–10.3)
Chloride: 106 mmol/L (ref 96–106)
Creatinine, Ser: 1.35 mg/dL — ABNORMAL HIGH (ref 0.57–1.00)
Glucose: 122 mg/dL — ABNORMAL HIGH (ref 70–99)
Potassium: 4.3 mmol/L (ref 3.5–5.2)
Sodium: 146 mmol/L — ABNORMAL HIGH (ref 134–144)
eGFR: 36 mL/min/{1.73_m2} — ABNORMAL LOW (ref >59)

## 2022-12-02 LAB — PRO B NATRIURETIC PEPTIDE: NT-Pro BNP: 432 pg/mL (ref 0–738)

## 2022-12-02 LAB — MAGNESIUM: Magnesium: 1.9 mg/dL (ref 1.6–2.3)

## 2022-12-03 NOTE — Progress Notes (Signed)
Spoke with patient and daughter. She acknowledged understanding and had no further questions. Reminded patient of appt on 12/10/2022

## 2022-12-10 ENCOUNTER — Encounter: Payer: Self-pay | Admitting: Cardiology

## 2022-12-10 ENCOUNTER — Ambulatory Visit: Payer: Medicare PPO | Admitting: Cardiology

## 2022-12-10 VITALS — BP 112/71 | HR 52 | Ht 66.0 in | Wt 159.2 lb

## 2022-12-10 DIAGNOSIS — I5032 Chronic diastolic (congestive) heart failure: Secondary | ICD-10-CM | POA: Diagnosis not present

## 2022-12-10 DIAGNOSIS — I872 Venous insufficiency (chronic) (peripheral): Secondary | ICD-10-CM

## 2022-12-10 DIAGNOSIS — I429 Cardiomyopathy, unspecified: Secondary | ICD-10-CM

## 2022-12-10 DIAGNOSIS — I10 Essential (primary) hypertension: Secondary | ICD-10-CM | POA: Diagnosis not present

## 2022-12-10 NOTE — Progress Notes (Signed)
[name] Date of Birth: Jun 06, 1925 MRN: [id number] Primary Care Provider:Wharton, Myrtha Mantis Former Cardiology Providers: Jeri Lager, APRN, FNP-C Primary Cardiologist: Rex Kras, DO, [facility] (established care May 23, 2020)  Date: 12/10/22 Last Office Visit: 05/31/2022  Chief Complaint  Patient presents with   Congestive Heart Failure   Follow-up    6 month     HPI  [name] is a 89 y.o.  female whose past medical history and cardiovascular risk factors include: Chronic heart failure with preserved EF, stage B, NYHA class II, history of recovered cardiomyopathy, history of DVT, GERD, history of GI bleed due to diverticulosis, chronic anemia, hypertension, chronic venous stasis, history of uterine cancer status post radiation therapy in 2016, history of breast cancer status postmastectomy and radiation therapy.  Patient presents to the office accompanied by her daughter [name].  She comes today for 69-monthfollow-up visit given her history of recovered cardiomyopathy with the and HFpEF.  In 2019 her LVEF was approximately 44% and after starting GDMT her LVEF has improved.  Recent echocardiogram from January 2024 illustrates hyperdynamic LV function with grade 2 diastolic dysfunction and elevated left atrial pressures.  Since last office visit patient has lost approximately 7 pounds likely secondary to poor appetite.  She is on Entresto and Lasix as needed for volume control.  Her lower extremity swelling is improved significantly since the last office visit.  She ambulates with a walker at home.  And is currently enrolled into palliative and has home health care at home as well.  Other noncardiac comorbidities include dementia/depression.  FUNCTIONAL STATUS: Walks with cane and walker.    ALLERGIES: No Known Allergies   MEDICATION LIST PRIOR TO VISIT: Current Outpatient Medications on File Prior to Visit  Medication Sig Dispense Refill   acetaminophen  (TYLENOL) 325 MG tablet Take 650 mg by mouth every 6 (six) hours as needed.     brexpiprazole (REXULTI) 1 MG TABS tablet Take 2 tablets (2 mg total) by mouth daily. Start 0.'5mg'$  (1/2 tablet) for 7 days, then '1mg'$  (1 tablet) for 7 days then '2mg'$  (2 tablets) daily (Patient taking differently: Take 1 mg by mouth 2 (two) times daily.) 60 tablet 11   Cholecalciferol (VITAMIN D3) 1000 UNITS CAPS Take 1,000 Units by mouth daily. Chewable tablet     citalopram (CELEXA) 20 MG tablet Take 20 mg by mouth daily.     ENTRESTO 24-26 MG TAKE 1 TABLET BY MOUTH TWICE DAILY 180 tablet 3   furosemide (LASIX) 20 MG tablet TAKE 1 TABLET BY MOUTH AS NEEDED 90 tablet 1   Multiple Vitamins-Minerals (MULTIVITAMIN ADULT) CHEW Chew 1 tablet by mouth daily.     NONFORMULARY OR COMPOUNDED ITEM Antifungal solution: Terbinafine 3%, Fluconazole 2%, Tea Tree Oil 5%, Urea 10%, Ibuprofen 2% in DMSO suspension #[id number] each 3   QUEtiapine (SEROQUEL) 25 MG tablet Take 0.5 tablets (12.5 mg total) by mouth at bedtime. 45 tablet 0   No current facility-administered medications on file prior to visit.    PAST MEDICAL HISTORY: Past Medical History:  Diagnosis Date   Anxiety    Breast cancer ([facility]   left 1994, right 2007   Dementia ([facility]   Diverticulosis    Diverticulosis of colon with hemorrhage 06/19/2014   DVT (deep venous thrombosis) ([facility]   right leg x 2   Endometrial adenocarcinoma ([facility]/01/2013   biopsy   Family history of malignant neoplasm of gastrointestinal tract    GERD (gastroesophageal reflux disease)  History of blood transfusion    Hx of radiation therapy 02/2006 - 03/2006   right breast   Hx of radiation therapy 5/12, 5/19, 5/29, 6/2, 04/30/2013   proximal vagina, 30 Gy in 5 sessions, HDR   Hypertension    Iron deficiency    Memory loss    Peripheral vascular disease ([place])    leg wounds   Personal history of radiation therapy    Ulcers of both lower extremities ([place])    chronic   Venous stasis     bilateral    PAST SURGICAL HISTORY: Past Surgical History:  Procedure Laterality Date   ABDOMINAL HYSTERECTOMY  01/18/13   robotic, bso   BREAST LUMPECTOMY Right    COLONOSCOPY WITH PROPOFOL N/A 09/16/2017   Procedure: COLONOSCOPY WITH PROPOFOL;  Surgeon: Wilford Corner, MD;  Location: [facility];  Service: Endoscopy;  Laterality: N/A;  Needs STAT CBC drawn before procedure   MASTECTOMY Left 1994   left , Tamoxifen x 17 yrs   partial mastectomy Right 2007   radiation    FAMILY HISTORY: The patient's family history includes Anuerysm in her sister; Breast cancer in her daughter and sister; Cancer in her brother and father; Colon cancer in her sister; Heart disease in her sister; Other in her mother; Prostate cancer in her brother.   SOCIAL HISTORY:  The patient  reports that she has never smoked. She has never used smokeless tobacco. She reports that she does not drink alcohol and does not use drugs.  Review of Systems  Cardiovascular:  Negative for chest pain, dyspnea on exertion, leg swelling, orthopnea, palpitations, paroxysmal nocturnal dyspnea and syncope.  Respiratory:  Negative for shortness of breath.     PHYSICAL EXAM:    12/10/2022    9:49 AM 07/15/2022    8:09 AM 05/31/2022    9:58 AM  Vitals with BMI  Height '5\' 6"'$  '5\' 6"'$  '5\' 6"'$   Weight 159 lbs 3 oz 166 lbs 166 lbs 10 oz  BMI 25.71 46.96 29.5  Systolic 284 132 440  Diastolic 71 67 61  Pulse 52 61 60    Physical Exam  Constitutional: No distress.  Age appropriate, hemodynamically stable.   Neck: No JVD present.  Cardiovascular: Normal rate, regular rhythm, S1 normal, S2 normal, intact distal pulses and normal pulses. Exam reveals no gallop, no S3 and no S4.  No murmur heard. Pulmonary/Chest: Effort normal and breath sounds normal. No stridor. She has no wheezes. She has no rales.  Abdominal: Soft. Bowel sounds are normal. She exhibits no distension. There is no abdominal tenderness.  Musculoskeletal:         General: Edema (trace bilateral) present.     Cervical back: Neck supple.     Comments: Bilateral compression stocking.   Neurological: She is alert and oriented to person, place, and time. She has intact cranial nerves (2-12).  Skin: Skin is warm and moist.   CARDIAC DATABASE: EKG: 12/10/2022: Normal sinus rhythm, 60 bpm, without underlying injury pattern.  Echocardiogram: 08/22/2018: LVEF 44%, moderate LVH, mildly decreased global wall motion, grade 1 diastolic impairment.  Trace MR, moderate TR, RVSP 40 mmHg.  07/02/2019: LVEF 10%, grade 1 diastolic impairment, mild biatrial dilatation, trace AR, mild MR, moderate TR, PASP 44 mmHg.  11/24/2022: Study Quality: Technically difficult study (patient in wheelchair throughout the study). Hyperdynamic LV systolic function with visual EF >70%. Left ventricle cavity is normal in size. Mild left ventricular hypertrophy. Normal global wall motion. Doppler evidence of grade II diastolic  dysfunction, elevated LAP.  Trace aortic regurgitation. Mild tricuspid regurgitation. Mild pulmonary hypertension. RVSP measures 37 mmHg. Compared to 07/02/2019 G1DD is now G2DD, elevated LAP is new, PASP was 11mHG, otherwise no significant change.   Stress Testing:  None  Heart Catheterization: None   LABORATORY DATA:    Latest Ref Rng & Units 12/20/2018   11:37 AM 08/08/2018    5:13 PM 06/19/2018   12:52 PM  CBC  WBC 4.0 - 10.5 K/uL 5.2  5.1  6.3   Hemoglobin 12.0 - 15.0 g/dL 10.5  10.2  10.8   Hematocrit 36.0 - 46.0 % 32.0  30.8  32.4   Platelets 150 - 400 K/uL 191  218  216        Latest Ref Rng & Units 12/01/2022    3:18 PM 06/08/2022    3:24 PM 06/01/2022    2:45 PM  CMP  Glucose 70 - 99 mg/dL 122  80  96   BUN 10 - 36 mg/dL 26  35  33   Creatinine 0.57 - 1.00 mg/dL 1.35  1.45  1.61   Sodium 134 - 144 mmol/L 146  143  144   Potassium 3.5 - 5.2 mmol/L 4.3  4.2  4.6   Chloride 96 - 106 mmol/L 106  106  106   CO2 20 - 29 mmol/L '26  24  25    '$ Calcium 8.7 - 10.3 mg/dL 9.9  9.5  9.5     Lipid Panel  No results found for: "CHOL", "TRIG", "HDL", "CHOLHDL", "VLDL", "LDLCALC", "LDLDIRECT", "LABVLDL"  No results found for: "HGBA1C" No components found for: "NTPROBNP" No results found for: "TSH"  Cardiac Panel (last 3 results) No results for input(s): "CKTOTAL", "CKMB", "TROPONINIHS", "RELINDX" in the last 72 hours.   External Labs: Collected: 09/24/2020 from care everywhere Creatinine 1.26 mg/dL. eGFR: 48 mL/min per 1.73 m Potassium 4.8 TSH 2.31 Magnesium 2.3  Collected: 08/20/2020 Serum creatinine 1.26 mg/dL. GFR 48 Lipid profile: Total cholesterol 161, triglycerides 54, HDL 56, LDL 94  IMPRESSION:    ICD-10-CM   1. Chronic diastolic heart failure (HCC)  I50.32 EKG 12-Lead    2. Recovered cardiomyopathy  I42.9     3. Chronic venous stasis dermatitis of both lower extremities  I87.2     4. Essential hypertension  I10        RECOMMENDATIONS: EMalayja Freundis a 89y.o. female whose past medical history and cardiovascular risk factors include: Chronic heart failure with preserved EF, stage B, NYHA class II, history of recovered cardiomyopathy, history of DVT, GERD, history of GI bleed due to diverticulosis, chronic anemia, hypertension, chronic venous stasis, history of uterine cancer status post radiation therapy in 2016, history of breast cancer status postmastectomy and radiation therapy.  Chronic diastolic heart failure (HCC) / Recovered cardiomyopathy Stage B, NYHA class II Compensated. 7 pounds of weight loss since last office visit. Most recent labs from January 2024 reviewed. Continue Entresto and Lasix as needed basis for volume management. Overall functional capacity is limited.  Will hold off on up titration of GDMT given her underlying bradycardia, blood pressures are soft for age and the risk of falls. Due to progressive dementia she is currently enrolled into palliative and has home health care  as well. I have asked her daughter to keep an eye on the weight and if she notices more than 1 pound over 24 hours or 3 pounds over a week she will give [place] call and in the interim  use Lasix for volume management. Patient has good family support.  Chronic venous stasis dermatitis of both lower extremities Currently follows with wound care. Improving.  Essential hypertension Well controlled. Medications reconciled. Reemphasized importance of low-salt diet.   No changes warranted at this time  FINAL MEDICATION LIST END OF ENCOUNTER: No orders of the defined types were placed in this encounter.     Current Outpatient Medications:    acetaminophen (TYLENOL) 325 MG tablet, Take 650 mg by mouth every 6 (six) hours as needed., Disp: , Rfl:    brexpiprazole (REXULTI) 1 MG TABS tablet, Take 2 tablets (2 mg total) by mouth daily. Start 0.'5mg'$  (1/2 tablet) for 7 days, then '1mg'$  (1 tablet) for 7 days then '2mg'$  (2 tablets) daily (Patient taking differently: Take 1 mg by mouth 2 (two) times daily.), Disp: 60 tablet, Rfl: 11   Cholecalciferol (VITAMIN D3) 1000 UNITS CAPS, Take 1,000 Units by mouth daily. Chewable tablet, Disp: , Rfl:    citalopram (CELEXA) 20 MG tablet, Take 20 mg by mouth daily., Disp: , Rfl:    ENTRESTO 24-26 MG, TAKE 1 TABLET BY MOUTH TWICE DAILY, Disp: 180 tablet, Rfl: 3   furosemide (LASIX) 20 MG tablet, TAKE 1 TABLET BY MOUTH AS NEEDED, Disp: 90 tablet, Rfl: 1   Multiple Vitamins-Minerals (MULTIVITAMIN ADULT) CHEW, Chew 1 tablet by mouth daily., Disp: , Rfl:    NONFORMULARY OR COMPOUNDED ITEM, Antifungal solution: Terbinafine 3%, Fluconazole 2%, Tea Tree Oil 5%, Urea 10%, Ibuprofen 2% in DMSO suspension #34m, Disp: 1 each, Rfl: 3   QUEtiapine (SEROQUEL) 25 MG tablet, Take 0.5 tablets (12.5 mg total) by mouth at bedtime., Disp: 45 tablet, Rfl: 0  Orders Placed This Encounter  Procedures   EKG 12-Lead   --Continue cardiac medications as reconciled in final medication  list. --Return in about 6 months (around 06/10/2023) for Follow up HFpEF. Or sooner if needed. --Continue follow-up with your primary care physician regarding the management of your other chronic comorbid conditions.  Patient's questions and concerns were addressed to her satisfaction. She voices understanding of the instructions provided during this encounter.   This note was created using a voice recognition software as a result there may be grammatical errors inadvertently enclosed that do not reflect the nature of this encounter. Every attempt is made to correct such errors.  SRex Kras DNevada FRegional Eye Surgery Center Inc Pager: [phone]: [phone]

## 2022-12-21 ENCOUNTER — Other Ambulatory Visit: Payer: Medicare PPO

## 2022-12-21 DIAGNOSIS — Z515 Encounter for palliative care: Secondary | ICD-10-CM

## 2022-12-21 NOTE — Progress Notes (Signed)
COMMUNITY PALLIATIVE CARE SW NOTE  PATIENT NAME: [name] DOB: [birth year] MRN: [id number]  PRIMARY CARE PROVIDER: Marda Stalker, PA-C  RESPONSIBLE PARTY:  Acct ID - Guarantor Home Phone Work Phone Relationship Acct Type  [id number] [name] [phone]  Self P/F     [address], [place], [place] [address]   Palliative Care Clinical Social Work Visit  PC SW and Nurse-D. Georgann Housekeeper completed a follow-up visit with patient at her home. Her daughter-[name] was present with her. Patient's daughter was assisting her with personal care. Patient denied pain. Patient's daughter provided a status update on patient. Patient will complete physical therapy with [facility] today. She was started on Seroquel with her Rexulti and patient's behavior seem to be better managed. Her daughter report that patient's agitation has decreased and she is eating is more. She saw the cardiologist last week and it was recommended that patient is weighed every other day. Her weight has been holding at 159 lbs. Her daughter appeared to be less anxious as patient's behaviors have improved. The team provided the daughter with a dementia workbook as additional literature for education regarding patient's dementia.  Patient's daughters continue to support each other as patient's primary caregivers as their goal is to keep patient at home.  No other concerns noted.   Next scheduled visit is 01/20/23 @ 11am.    Social History   Tobacco Use   Smoking status: Never   Smokeless tobacco: Never  Substance Use Topics   Alcohol use: No    CODE STATUS: Patient remains a Full Code ADVANCED DIRECTIVES: No MOST FORM COMPLETE:  No HOSPICE EDUCATION PROVIDED: No  Duration of visit and documentation: 60 minutes  Teagon Kron, LCSW

## 2022-12-22 NOTE — Progress Notes (Signed)
PATIENT NAME: [name] DOB: [birth year] MRN: [id number]  PRIMARY CARE PROVIDER: Marda Stalker, PA-C  RESPONSIBLE PARTY:  Acct ID - Guarantor Home Phone Work Phone Relationship Acct Type  [id number] Chipper [phone]  Self P/F     [address], [place], [place] [address]   Arrived at patient's home and daughter [name] answers the door. [name] is assisting patient with her grooming and dressing.  [name] reports patient was started on Seroquel and the medication has made a positive change in the patient's behaviors. She notes the night time agitation is less and patient is able to get sleep. [name] also states patient is eating more. No other concerns/questions at this time.  Provided [place] with a Dementia booklet for additional education.   Denies pain. Denies falls.    Next scheduled appt: 2.29.24 @ 11am  HISTORY OF PRESENT ILLNESS:  89 y.o.  female whose past medical history and cardiovascular risk factors include: Chronic heart failure with preserved EF, stage B, NYHA class II, history of recovered cardiomyopathy, history of, GERD, history of GI bleed due to diverticulosis, chronic anemia, hypertension, chronic venous stasis, history of uterine cancer status post radiation therapy in 2016, history of breast cancer status postmastectomy and radiation therapy. Cared for by her daughters, granddaughter, and niece.   CODE STATUS: Full Code  ADVANCED DIRECTIVES: N MOST FORM: N    PHYSICAL EXAM:   VITALS:There were no vitals filed for this visit.        Anabelen Kaminsky Georgann Housekeeper, LPN

## 2023-01-04 DIAGNOSIS — F339 Major depressive disorder, recurrent, unspecified: Secondary | ICD-10-CM | POA: Diagnosis not present

## 2023-01-11 DIAGNOSIS — R051 Acute cough: Secondary | ICD-10-CM | POA: Diagnosis not present

## 2023-01-19 DIAGNOSIS — F339 Major depressive disorder, recurrent, unspecified: Secondary | ICD-10-CM | POA: Diagnosis not present

## 2023-01-20 ENCOUNTER — Other Ambulatory Visit: Payer: Medicare PPO

## 2023-01-20 VITALS — BP 110/56 | HR 76 | Temp 97.7°F | Resp 16

## 2023-01-20 DIAGNOSIS — Z515 Encounter for palliative care: Secondary | ICD-10-CM

## 2023-01-20 NOTE — Progress Notes (Signed)
PATIENT NAME: [name] DOB: 1925/08/12 MRN: [id number]  PRIMARY CARE PROVIDER: Marda Stalker, PA-C  RESPONSIBLE PARTY:  Acct ID - Guarantor Home Phone Work Phone Relationship Acct Type  [id number] [name]  Self P/F     [address] [place], [place] [address]   Palliative Care Follow Up Encounter Note   LPN completed home visit. [name] (daughter) also present.     HISTORY OF PRESENT ILLNESS:    Respiratory: no issues at this time  Cardiac: CHF; no edema noted to extremeties  Cognitive: Dementia   Appetite: 3 full meals and  snacks; drinks about 5-6 cups of fluid daily  GI/GU: still able to make needs known; wears an adult brief for occasional accidents   Mobility: ambulates with the walker in the home and a transport wheelchair outside of the home; uses a cane from the house to the car with assist  ADLs: totally dependent   Sleeping Pattern: no sleeping issues due to new meds that have quieted her behavioral disturbances  Pain: denies pain  Wt: 154.4 on 01/19/23; pt is weighed every other day   Goals of Care: To stay in the home with her dtr for as long as possible   CODE STATUS: Full Code ADVANCED DIRECTIVES: N MOST FORM: N PPS: 30%   PHYSICAL EXAM:   VITALS: Today's Vitals   01/20/23 1137  BP: (!) 110/56  Pulse: 76  Resp: 16  Temp: 97.7 F (36.5 C)  SpO2: 99%    LUNGS: clear to auscultation  CARDIAC: Cor RRR EXTREMITIES: normal SKIN: Skin color, texture, turgor normal. No rashes or lesions  NEURO: negative except for gait problems and memory problems       [name] [name] Housekeeper, LPN

## 2023-02-04 ENCOUNTER — Encounter: Payer: Self-pay | Admitting: Podiatry

## 2023-02-04 ENCOUNTER — Ambulatory Visit: Payer: Medicare PPO | Admitting: Podiatry

## 2023-02-04 VITALS — BP 155/73 | HR 65

## 2023-02-04 DIAGNOSIS — B351 Tinea unguium: Secondary | ICD-10-CM | POA: Diagnosis not present

## 2023-02-04 DIAGNOSIS — L84 Corns and callosities: Secondary | ICD-10-CM | POA: Diagnosis not present

## 2023-02-04 DIAGNOSIS — M79675 Pain in left toe(s): Secondary | ICD-10-CM

## 2023-02-04 DIAGNOSIS — M79674 Pain in right toe(s): Secondary | ICD-10-CM | POA: Diagnosis not present

## 2023-02-04 DIAGNOSIS — I739 Peripheral vascular disease, unspecified: Secondary | ICD-10-CM

## 2023-02-04 NOTE — Progress Notes (Unsigned)
  Subjective:  Patient ID: [name], female    DOB: 10/07/1925,  MRN: [id number]  [name] presents to clinic today for {jgcomplaint:23593}  Chief Complaint  Patient presents with   Nail Problem    Trim,  [id number], Loma Sousa, PA-C-last seen :02/24      New problem(s): None. {jgcomplaint:23593}  PCP is Marda Stalker, PA-C.  No Known Allergies  Review of Systems: Negative except as noted in the HPI.  Objective: No changes noted in today's physical examination. Vitals:   02/04/23 1040  BP: (!) 155/73  Pulse: 65   [name] is a pleasant 89 y.o. female {jgbodyhabitus:24098} AAO x 3.  Vascular:  CFT <3 seconds b/l LE. Palpable DP pulse(s) b/l LE. Diminished PT pulse(s) b/l LE. Pedal hair absent. No pain with calf compression b/l. BK dressing LLE which is clean, dry and intact. Wearing compression stocking RLE.  Dermatological:  Pedal skin thin, shiny and atrophic b/l LE. No interdigital macerations noted b/l LE.   Hyperkeratosis L hallux and L 2nd toe resolved, but there is abutment of digits due to bunion deformity.  Musculoskeletal:  Normal muscle strength 5/5 to all lower extremity muscle groups bilaterally.   HAV with bunion deformity noted b/l LE.   Hammertoe(s) noted to the L 2nd toe. No pain, crepitus or joint limitation noted with ROM b/l LE.  Patient ambulates independently without assistive aids.   Neurological:  Assessment/Plan: No diagnosis found.  No orders of the defined types were placed in this encounter.   None {Jgplan:23602::"-Patient/POA to call should there be question/concern in the interim."}   Return in about 3 months (around 05/07/2023).  Marzetta Board, DPM

## 2023-02-16 ENCOUNTER — Telehealth: Payer: Self-pay

## 2023-02-16 ENCOUNTER — Encounter (HOSPITAL_COMMUNITY): Payer: Self-pay

## 2023-02-16 ENCOUNTER — Emergency Department (HOSPITAL_COMMUNITY)
Admission: EM | Admit: 2023-02-16 | Discharge: 2023-02-16 | Disposition: A | Discharge status: Home / Self Care | Payer: Medicare PPO | Attending: Emergency Medicine | Admitting: Emergency Medicine

## 2023-02-16 ENCOUNTER — Emergency Department (HOSPITAL_COMMUNITY): Payer: Medicare PPO

## 2023-02-16 ENCOUNTER — Other Ambulatory Visit: Payer: Self-pay

## 2023-02-16 DIAGNOSIS — N390 Urinary tract infection, site not specified: Secondary | ICD-10-CM

## 2023-02-16 DIAGNOSIS — Z86718 Personal history of other venous thrombosis and embolism: Secondary | ICD-10-CM | POA: Diagnosis not present

## 2023-02-16 DIAGNOSIS — I110 Hypertensive heart disease with heart failure: Secondary | ICD-10-CM | POA: Insufficient documentation

## 2023-02-16 DIAGNOSIS — I10 Essential (primary) hypertension: Secondary | ICD-10-CM | POA: Diagnosis not present

## 2023-02-16 DIAGNOSIS — I509 Heart failure, unspecified: Secondary | ICD-10-CM | POA: Insufficient documentation

## 2023-02-16 DIAGNOSIS — Z853 Personal history of malignant neoplasm of breast: Secondary | ICD-10-CM | POA: Insufficient documentation

## 2023-02-16 DIAGNOSIS — F0390 Unspecified dementia without behavioral disturbance: Secondary | ICD-10-CM | POA: Insufficient documentation

## 2023-02-16 DIAGNOSIS — Z79899 Other long term (current) drug therapy: Secondary | ICD-10-CM | POA: Insufficient documentation

## 2023-02-16 DIAGNOSIS — R531 Weakness: Secondary | ICD-10-CM | POA: Diagnosis not present

## 2023-02-16 DIAGNOSIS — R059 Cough, unspecified: Secondary | ICD-10-CM | POA: Diagnosis not present

## 2023-02-16 DIAGNOSIS — J189 Pneumonia, unspecified organism: Secondary | ICD-10-CM | POA: Diagnosis not present

## 2023-02-16 DIAGNOSIS — R509 Fever, unspecified: Secondary | ICD-10-CM | POA: Diagnosis not present

## 2023-02-16 DIAGNOSIS — R7989 Other specified abnormal findings of blood chemistry: Secondary | ICD-10-CM | POA: Diagnosis not present

## 2023-02-16 DIAGNOSIS — U071 COVID-19: Secondary | ICD-10-CM | POA: Insufficient documentation

## 2023-02-16 DIAGNOSIS — R0902 Hypoxemia: Secondary | ICD-10-CM | POA: Diagnosis not present

## 2023-02-16 DIAGNOSIS — Z7401 Bed confinement status: Secondary | ICD-10-CM | POA: Diagnosis not present

## 2023-02-16 LAB — COMPREHENSIVE METABOLIC PANEL
ALT: 11 U/L (ref 0–44)
AST: 16 U/L (ref 15–41)
Albumin: 2.8 g/dL — ABNORMAL LOW (ref 3.5–5.0)
Alkaline Phosphatase: 50 U/L (ref 38–126)
Anion gap: 9 (ref 5–15)
BUN: 18 mg/dL (ref 8–23)
CO2: 30 mmol/L (ref 22–32)
Calcium: 9.3 mg/dL (ref 8.9–10.3)
Chloride: 101 mmol/L (ref 98–111)
Creatinine, Ser: 1.22 mg/dL — ABNORMAL HIGH (ref 0.44–1.00)
GFR, Estimated: 41 mL/min — ABNORMAL LOW (ref >60)
Glucose, Bld: 84 mg/dL (ref 70–99)
Potassium: 4.6 mmol/L (ref 3.5–5.1)
Sodium: 140 mmol/L (ref 135–145)
Total Bilirubin: 0.7 mg/dL (ref 0.3–1.2)
Total Protein: 6.4 g/dL — ABNORMAL LOW (ref 6.5–8.1)

## 2023-02-16 LAB — URINALYSIS, W/ REFLEX TO CULTURE (INFECTION SUSPECTED)
Bilirubin Urine: NEGATIVE
Glucose, UA: NEGATIVE mg/dL
Hgb urine dipstick: NEGATIVE
Ketones, ur: NEGATIVE mg/dL
Nitrite: NEGATIVE
Protein, ur: NEGATIVE mg/dL
Specific Gravity, Urine: 1.008 (ref 1.005–1.030)
WBC, UA: >50 WBC/hpf (ref 0–5)
pH: 6 (ref 5.0–8.0)

## 2023-02-16 LAB — PROTIME-INR
INR: 1.1 (ref 0.8–1.2)
Prothrombin Time: 13.7 seconds (ref 11.4–15.2)

## 2023-02-16 LAB — RESP PANEL BY RT-PCR (RSV, FLU A&B, COVID)  RVPGX2
Influenza A by PCR: NEGATIVE
Influenza B by PCR: NEGATIVE
Resp Syncytial Virus by PCR: NEGATIVE
SARS Coronavirus 2 by RT PCR: POSITIVE — AB

## 2023-02-16 LAB — CBC WITH DIFFERENTIAL/PLATELET
Abs Immature Granulocytes: 0.03 10*3/uL (ref 0.00–0.07)
Basophils Absolute: 0 10*3/uL (ref 0.0–0.1)
Basophils Relative: 0 %
Eosinophils Absolute: 0 10*3/uL (ref 0.0–0.5)
Eosinophils Relative: 0 %
HCT: 32.5 % — ABNORMAL LOW (ref 36.0–46.0)
Hemoglobin: 10.8 g/dL — ABNORMAL LOW (ref 12.0–15.0)
Immature Granulocytes: 0 %
Lymphocytes Relative: 11 %
Lymphs Abs: 1 10*3/uL (ref 0.7–4.0)
MCH: 29 pg (ref 26.0–34.0)
MCHC: 33.2 g/dL (ref 30.0–36.0)
MCV: 87.1 fL (ref 80.0–100.0)
Monocytes Absolute: 0.5 10*3/uL (ref 0.1–1.0)
Monocytes Relative: 6 %
Neutro Abs: 7.2 10*3/uL (ref 1.7–7.7)
Neutrophils Relative %: 83 %
Platelets: 223 10*3/uL (ref 150–400)
RBC: 3.73 MIL/uL — ABNORMAL LOW (ref 3.87–5.11)
RDW: 15.3 % (ref 11.5–15.5)
WBC: 8.8 10*3/uL (ref 4.0–10.5)
nRBC: 0 % (ref 0.0–0.2)

## 2023-02-16 LAB — APTT: aPTT: 37 seconds — ABNORMAL HIGH (ref 24–36)

## 2023-02-16 LAB — LACTIC ACID, PLASMA: Lactic Acid, Venous: 0.9 mmol/L (ref 0.5–1.9)

## 2023-02-16 MED ORDER — CEPHALEXIN 500 MG PO CAPS: ORAL_CAPSULE | ORAL | 0 refills | Status: DC

## 2023-02-16 MED ORDER — SODIUM CHLORIDE 0.9 % IV SOLN
1.0000 g | Freq: Once | INTRAVENOUS | Status: AC
  Administered 2023-02-16: 1 g via INTRAVENOUS
  Filled 2023-02-16: qty 10

## 2023-02-16 MED ORDER — LACTATED RINGERS IV BOLUS (SEPSIS)
500.0000 mL | Freq: Once | INTRAVENOUS | Status: AC
  Administered 2023-02-16: 500 mL via INTRAVENOUS

## 2023-02-16 NOTE — Discharge Instructions (Signed)
Symptoms today appear to be secondary to COVID although a urinary tract infection was also identified. Please drink plenty of fluids Return to the emergency department if you are having worsening symptoms especially increased fever or inability to take fluids by mouth.

## 2023-02-16 NOTE — ED Provider Notes (Signed)
[name]   CSN: [id number] Arrival date & time: 02/16/23  1203     History  Chief Complaint  Patient presents with   Weakness    [name] is a 89 y.o. female.  HPI 89 year old female history of dementia, breast cancer status post left mastectomy, hypertension, GI bleeding, DVT, CHF, [place] today with increased weakness over the past 2 days.  Daughter states that she has had a fever at home with cough and congestion.  Been increasingly weak and has not walked since 2 days ago.  She did eat today having pancakes and orange juice     Home Medications Prior to Admission medications   Medication Sig Start Date End Date Taking? Authorizing Provider  acetaminophen (TYLENOL) 325 MG tablet Take 650 mg by mouth every 6 (six) hours as needed.    [provider]  brexpiprazole (REXULTI) 1 MG TABS tablet Take 2 tablets (2 mg total) by mouth daily. Start 0.5mg  (1/2 tablet) for 7 days, then 1mg  (1 tablet) for 7 days then 2mg  (2 tablets) daily 10/27/22   Lomax, Amy, NP  Cholecalciferol (VITAMIN D3) 1000 UNITS CAPS Take 1,000 Units by mouth daily. Chewable tablet    [provider]  citalopram (CELEXA) 20 MG tablet Take 20 mg by mouth daily. 05/14/19   [provider]  ENTRESTO 24-26 MG TAKE 1 TABLET BY MOUTH TWICE DAILY 05/10/22   Adrian Prows, MD  furosemide (LASIX) 20 MG tablet TAKE 1 TABLET BY MOUTH AS NEEDED 04/20/22   Tolia, Sunit, DO  Multiple Vitamins-Minerals (MULTIVITAMIN ADULT) CHEW Chew 1 tablet by mouth daily.    [provider]  NONFORMULARY OR COMPOUNDED ITEM Antifungal solution: Terbinafine 3%, Fluconazole 2%, Tea Tree Oil 5%, Urea 10%, Ibuprofen 2% in DMSO suspension #54mL 05/27/20   Galaway, Stephani Police, DPM  QUEtiapine (SEROQUEL) 25 MG tablet Take 0.5 tablets (12.5 mg total) by mouth at bedtime. 12/01/22   Debbora Presto, NP      Allergies    Patient has no known allergies.    Review  of Systems   Review of Systems  Physical Exam Updated Vital Signs BP (!) 154/73   Pulse 91   Temp 98.5 F (36.9 C) (Rectal)   Resp 16   SpO2 97%  Physical Exam Vitals reviewed.  HENT:     Head: Normocephalic and atraumatic.     Right Ear: External ear normal.     Left Ear: External ear normal.     Nose: Nose normal.     Mouth/Throat:     Mouth: Mucous membranes are dry.     Pharynx: Oropharynx is clear.  Eyes:     Extraocular Movements: Extraocular movements intact.  Cardiovascular:     Rate and Rhythm: Normal rate and regular rhythm.     Pulses: Normal pulses.  Pulmonary:     Effort: Pulmonary effort is normal.     Breath sounds: Normal breath sounds.  Abdominal:     General: Abdomen is flat. Bowel sounds are normal.     Palpations: Abdomen is soft.  Musculoskeletal:        General: Normal range of motion.     Cervical back: Normal range of motion.  Skin:    General: Skin is warm and dry.     Capillary Refill: Capillary refill takes less than 2 seconds.  Neurological:     General: No focal deficit present.     Mental Status: She is alert.  Psychiatric:        Mood and Affect: Mood normal.     ED Results / Procedures / Treatments   Labs (all labs ordered are listed, but only abnormal results are displayed) Labs Reviewed  CULTURE, BLOOD (ROUTINE X 2)  CULTURE, BLOOD (ROUTINE X 2)  RESP PANEL BY RT-PCR (RSV, FLU A&B, COVID)  [id number]  LACTIC ACID, PLASMA  LACTIC ACID, PLASMA  COMPREHENSIVE METABOLIC PANEL  CBC WITH DIFFERENTIAL/PLATELET  PROTIME-INR  APTT  URINALYSIS, W/ REFLEX TO CULTURE (INFECTION SUSPECTED)    EKG None  Radiology No results found.  Procedures Procedures    Medications Ordered in ED Medications  lactated ringers bolus 500 mL (has no administration in time range)    ED Course/ Medical Decision Making/ A&P Clinical Course as of 02/16/23 1621  Wed Feb 16, 2023  [id number] Complete metabolic panel reviewed interpreted significant  for creatinine elevated 1.22 [DR]    Clinical Course User Index [DR] Pattricia Boss, MD                             Medical Decision Making Amount and/or Complexity of Data Reviewed Labs: ordered. Radiology: ordered. ECG/medicine tests: ordered.  89 year old female presents today with increased weakness.  She has had fever at home.  Here her COVID test is positive. Patient has anemia that is stable.  Complete metabolic panel is essentially normal. Urinalysis is pending  Chest x-Davionne Mastrangelo without evidence of pneumonia Patient has received IV fluids here in the ED Patient sitting up in bed and having pleasant conversation. Discussed care with daughter who is at bedside. Urinalysis pending Pharmacy consulted regarding outpatient therapy Plan discharged home after urine returns We have discussed return precautions and need for close follow-up and they voiced understanding Discussed with pharmacist and paxlovid contraindicated with quetiapine Plan roecphin and d/c        Final Clinical Impression(s) / ED Diagnoses Final diagnoses:  None    Rx / DC Orders ED Discharge Orders     None         Pattricia Boss, MD 02/16/23 1718

## 2023-02-16 NOTE — ED Triage Notes (Signed)
Pt bib ems from home; c/o productive cough and generalized weakness x 1 week, worsning Sunday; normally ambulatory with walker, has not walked since Sunday due to weakness; rhonchi R lower lobe; rr 24, co2 40, cbg 164, 98% RA, HR 120 sinus tach; no urinary symptoms,; temp 100 F, endorses fever at at home

## 2023-02-16 NOTE — ED Notes (Signed)
Pt incontinent of stool; pt cleaned, new brief applied

## 2023-02-16 NOTE — ED Notes (Signed)
Ptar called pt is number 6 on the list ?

## 2023-02-16 NOTE — Telephone Encounter (Signed)
Palliative Telephone Encounter  2:52pm - called [name] to return her call from earlier today. A female answered her phone and reported that [name] was currently in the ED with the pt. LPN asked the female to tell [name] that "the Palliative Care Nurse from [facility] called". She voiced agreement.  Thank you, Lorelee Market, LPN [facility] Palliative Care Nurse [phone]

## 2023-02-18 LAB — URINE CULTURE: Culture: 40000 — AB

## 2023-02-19 ENCOUNTER — Telehealth (HOSPITAL_BASED_OUTPATIENT_CLINIC_OR_DEPARTMENT_OTHER): Payer: Self-pay | Admitting: *Deleted

## 2023-02-19 NOTE — Progress Notes (Signed)
ED Antimicrobial Stewardship Positive Culture Follow Up   [name] is an 89 y.o. female who presented to [facility] on 02/16/2023 with a chief complaint of  Chief Complaint  Patient presents with   Weakness    Recent Results (from the past 720 hour(s))  Blood Culture (routine x 2)     Status: None (Preliminary result)   Collection Time: 02/16/23 12:56 PM   Specimen: BLOOD  Result Value Ref Range Status   Specimen Description BLOOD RIGHT ANTECUBITAL  Final   Special Requests   Final    BOTTLES DRAWN AEROBIC AND ANAEROBIC Blood Culture results may not be optimal due to an inadequate volume of blood received in culture bottles   Culture   Final    NO GROWTH 3 DAYS Performed at [facility], [place] [address] [place], [place] [address]    Report Status PENDING  Incomplete  Blood Culture (routine x 2)     Status: None (Preliminary result)   Collection Time: 02/16/23  1:01 PM   Specimen: BLOOD RIGHT ARM  Result Value Ref Range Status   Specimen Description BLOOD RIGHT ARM  Final   Special Requests   Final    BOTTLES DRAWN AEROBIC AND ANAEROBIC Blood Culture results may not be optimal due to an excessive volume of blood received in culture bottles   Culture   Final    NO GROWTH 3 DAYS Performed at [facility], [place] [address] [place], [name] [address]    Report Status PENDING  Incomplete  Resp panel by RT-PCR (RSV, Flu A&B, Covid)     Status: Abnormal   Collection Time: 02/16/23  1:30 PM   Specimen: Nasal Swab  Result Value Ref Range Status   SARS Coronavirus 2 by RT PCR POSITIVE (A) NEGATIVE Final   Influenza A by PCR NEGATIVE NEGATIVE Final   Influenza B by PCR NEGATIVE NEGATIVE Final    Comment: (NOTE) The Xpert Xpress SARS-CoV-2/FLU/RSV plus assay is intended as an aid in the diagnosis of influenza from Nasopharyngeal swab specimens and should not be used as a sole basis for treatment. Nasal washings and aspirates are unacceptable for Xpert Xpress  SARS-CoV-2/FLU/RSV testing.  Fact Sheet for Patients: EntrepreneurPulse.com.au  Fact Sheet for Healthcare Providers: IncredibleEmployment.be  This test is not yet approved or cleared by the [place] FDA and has been authorized for detection and/or diagnosis of SARS-CoV-2 by FDA under an Emergency Use Authorization (EUA). This EUA will remain in effect (meaning this test can be used) for the duration of the COVID-19 declaration under Section 564(b)(1) of the Act, 21 U.S.C. section 360bbb-3(b)(1), unless the authorization is terminated or revoked.     Resp Syncytial Virus by PCR NEGATIVE NEGATIVE Final    Comment: (NOTE) Fact Sheet for Patients: EntrepreneurPulse.com.au  Fact Sheet for Healthcare Providers: IncredibleEmployment.be  This test is not yet approved or cleared by the [place] FDA and has been authorized for detection and/or diagnosis of SARS-CoV-2 by FDA under an Emergency Use Authorization (EUA). This EUA will remain in effect (meaning this test can be used) for the duration of the COVID-19 declaration under Section 564(b)(1) of the Act, 21 U.S.C. section 360bbb-3(b)(1), unless the authorization is terminated or revoked.  Performed at [facility], [place] [address], [place], [place] [address]   Urine Culture     Status: Abnormal   Collection Time: 02/16/23  3:35 PM   Specimen: Urine, Random  Result Value Ref Range Status  Specimen Description URINE, RANDOM  Final   Special Requests   Final    NONE Reflexed from [id number] Performed at [facility], [place] [address], [place], [place] [address]    Culture 40,000 COLONIES/mL ENTEROCOCCUS FAECALIS (A)  Final   Report Status 02/18/2023 FINAL  Final   Organism ID, Bacteria ENTEROCOCCUS FAECALIS (A)  Final      Susceptibility   Enterococcus faecalis - MIC*    AMPICILLIN <=2 SENSITIVE Sensitive     NITROFURANTOIN <=16 SENSITIVE  Sensitive     VANCOMYCIN 2 SENSITIVE Sensitive     * 40,000 COLONIES/mL ENTEROCOCCUS FAECALIS    [x]  Treated with Keflex, organism resistant to prescribed antimicrobial  STOP Keflex START: Augmentin 500/125 mg tablets: take 1 tablet by mouth twice daily for 7 days (Qty 14; Refills 0)  ED Provider: Lavenia Atlas DO   Lorelei Pont, PharmD, BCPS 02/19/2023 10:57 AM ED Clinical Pharmacist -  [phone]

## 2023-02-19 NOTE — Telephone Encounter (Signed)
Post ED Visit - Positive Culture Follow-up: Successful Patient Follow-Up  Culture assessed and recommendations reviewed by:  []  Elenor Quinones, Pharm.D. []  Heide Guile, Pharm.D., BCPS AQ-ID []  Parks Neptune, Pharm.D., BCPS []  Alycia Rossetti, Pharm.D., BCPS []  [place], Pharm.D., BCPS, AAHIVP []  Legrand Como, Pharm.D., BCPS, AAHIVP []  Salome Arnt, PharmD, BCPS []  Johnnette Gourd, PharmD, BCPS []  Gates Better, PharmD, BCPS []  Leeroy Cha, PharmD  Positive urine culture  [x]  Patient discharged without antimicrobial prescription and treatment is now indicated []  Organism is resistant to prescribed ED discharge antimicrobial []  Patient with positive blood cultures  Changes discussed with ED provider: Fulton Reek, MD New antibiotic prescription Augmentin 500/125 mg suspension, take 2 tsp BID x 7 days. Stop Keflex Called to Nolon Lennert [phone]  Contacted daughter of patient, date 02/19/2023, time [place], [place] 02/19/2023, 12:07 PM

## 2023-02-21 LAB — CULTURE, BLOOD (ROUTINE X 2)
Culture: NO GROWTH
Culture: NO GROWTH

## 2023-02-24 ENCOUNTER — Other Ambulatory Visit: Payer: Medicare PPO

## 2023-02-24 VITALS — BP 110/50 | HR 64 | Temp 97.0°F

## 2023-02-24 DIAGNOSIS — Z515 Encounter for palliative care: Secondary | ICD-10-CM

## 2023-02-24 NOTE — Progress Notes (Signed)
COMMUNITY PALLIATIVE CARE SW NOTE  PATIENT NAME: [name] DOB: 1925/09/25 MRN: [id number]  PRIMARY CARE PROVIDER: Marda Stalker, PA-C  RESPONSIBLE PARTY:  Acct ID - Guarantor Home Phone Work Phone Relationship Acct Type  [id number] Chipper [phone]  Self P/F     [address] [place], [place] [address]   Palliative Care Follow-Up/Clinical Social Work   SW and nurse-D. Georgann Housekeeper completed a visit with patient at her home. Her daughter-[name] was present with her. Patient was sitting her recliner, awake and alert. She was reclined and holding some magazines. Patient had a recent hospitalization due to [place]. Her daughter reported that since returning home, patient had done well and appear to be back at her baseline.   Patient has a congested cough that is unproductive. Her daughter advises that patient is not having any respiratory issues or SOB. She ambulates with a walker. She uses a cane to walk out to the car. She is transported in a wheelchair outside the home. The family is working on getting a ramp currently. Patient is sleeping throughout the night. Patient is eating well. At least 3 full meals per day. She is also drinking 5-6 cups of fluids per day. She has involuntary tremors in her hand. Patient is wearing adult briefs, with occasional accidents.   Next appointment: Mar 24, 2023 at 10am.    Social History   Tobacco Use   Smoking status: Never   Smokeless tobacco: Never  Substance Use Topics   Alcohol use: No    CODE STATUS: Full Code ADVANCED DIRECTIVES: No MOST FORM COMPLETE:  No HOSPICE EDUCATION PROVIDED: No  Duration of visit and documentation: 60 minutes  Arelia Volpe, LCSW

## 2023-02-24 NOTE — Progress Notes (Signed)
PATIENT NAME: [name] DOB: 10/05/1925 MRN: [id number]  PRIMARY CARE PROVIDER: Jarrett Soho, PA-C  RESPONSIBLE PARTY:  Acct ID - Guarantor Home Phone Work Phone Relationship Acct Type  [id number] [name]* [phone]  Self P/F     [address] [place], [place] [address]     Palliative Care Follow Up Encounter Note    LPN completed home visit. Kendal Hymen (daughter) also present.        Respiratory: still no issues   Cardiac: CHF; no edema noted to extremeties   Cognitive: Dementia; still able to make needs known   Appetite: 3 full meals and snacks; eats 100% of what she is given; drinks about 5-6 cups of fluid daily   GI/GU: wears an adult brief for occasional accidents   Mobility: ambulates with the walker in the home and a transport wheelchair outside of the home; uses a cane from the house to the car with assist from daughters; dtr reports they will soon get a ramp   ADLs: totally dependent   Sleeping Pattern: no sleeping issues due to new meds that have quieted her behavioral disturbances   Pain: denies pain this time   Wt: 154.4 on 01/19/23; pt is weighed every other day; 159 lbs on 3/29     Goals of Care: To stay in the home with her dtr for as long as possible   CODE STATUS: Full Code ADVANCED DIRECTIVES: N MOST FORM: N PPS: 40%   PHYSICAL EXAM:   VITALS: Today's Vitals   02/24/23 1111  BP: (!) 110/50  Pulse: 64  Temp: (!) 97 F (36.1 C)  TempSrc: Temporal  SpO2: 98%  PainSc: 0-No pain    LUNGS: clear to auscultation  CARDIAC: Cor RRR EXTREMITIES: no edema; ROM is limited SKIN: cool, dry, intact NEURO: negative except for gait problems, memory problems, and weakness and sometimes tremors       Alba Kriesel Clementeen Graham, LPN

## 2023-03-22 DIAGNOSIS — F339 Major depressive disorder, recurrent, unspecified: Secondary | ICD-10-CM | POA: Diagnosis not present

## 2023-03-24 ENCOUNTER — Other Ambulatory Visit: Payer: Medicare PPO

## 2023-03-24 VITALS — BP 102/50 | HR 62 | Temp 98.1°F

## 2023-03-24 DIAGNOSIS — Z515 Encounter for palliative care: Secondary | ICD-10-CM

## 2023-03-24 NOTE — Progress Notes (Signed)
PATIENT NAME: [name] DOB: [birth year] MRN: [id number]  PRIMARY CARE PROVIDER: Jarrett Soho, PA-C  RESPONSIBLE PARTY:  Acct ID - Guarantor Home Phone Work Phone Relationship Acct Type  [id number] [name]* [phone]  Self P/F     [address], [place], [place] [address]    Palliative Care Follow Up Encounter Note    LPN completed home visit. Kendal Hymen (daughter) also present.    Today's Visit:  Respiratory: still no issues   Cardiac: CHF; no edema noted to extremeties   Cognitive: Dementia; still able to make needs known   Appetite: still eats 3 full meals and snacks; eats 100% of what she is given; drinks about 5-6 cups of fluid daily   GI/GU: as BM every other day; wears an adult brief for occasional accidents; had a UTI but is not having any issues at this time   Mobility: ambulates with the walker in the home and a transport wheelchair outside of the home; uses a cane from the house to the car with assist from daughters; dtr reports they will soon get a ramp   ADLs: totally dependent   Sleeping Pattern: new med changes because of sleep and behavioral changes   Pain: denies pain this time   Wt: 154.4 on 01/19/23; pt is weighed every other day; 159 lbs on 3/29; 4/26 wt was 165 lbs     Goals of Care: To stay in the home with her dtrs for as long as possible    CODE STATUS: Full Code ADVANCED DIRECTIVES: N MOST FORM: N PPS: 40%   Med change: Rexulti from 2mg  to 3mg   PHYSICAL EXAM:   VITALS: Today's Vitals   03/24/23 1006  BP: (!) 102/50  Pulse: 62  Temp: 98.1 F (36.7 C)  TempSrc: Temporal  SpO2: 98%  PainSc: 0-No pain    LUNGS: clear to auscultation  CARDIAC: Cor RRR EXTREMITIES: no edema; ROM is limited SKIN: cool, dry, intact NEURO: negative except for gait problems, memory problems, and weakness and sometimes tremors   Nest visit: 04/21/23 @ 12pm    Consuello Masse, RN

## 2023-04-12 DIAGNOSIS — R350 Frequency of micturition: Secondary | ICD-10-CM | POA: Diagnosis not present

## 2023-04-19 ENCOUNTER — Telehealth: Payer: Self-pay

## 2023-04-19 DIAGNOSIS — Z515 Encounter for palliative care: Secondary | ICD-10-CM

## 2023-04-19 NOTE — Telephone Encounter (Signed)
(  3:23 pm) PC SW received a call from to patient's daughter-Lorraine. She stated she wanted to update the team on changes they have been experiencing with patient over the past two weeks. Patient is no longer able to use her eating utensils, she is having difficulty swallowing meds, she is complete assist for all personal care tasks. She complains of being hot. She is constantly asking to "go home" and when others don't react she becomes irritable. Her balance is worsening and had three episodes where she she almost fell.  The palliative care nurse is scheduled to see patient on Thursday. SW advised [name] that she will update the nurse and request follow-up. No there concerns noted. *Palliative care nurse-D. Clementeen Graham updated for follow-up.

## 2023-04-21 ENCOUNTER — Other Ambulatory Visit: Payer: Medicare PPO

## 2023-04-21 VITALS — BP 120/70 | HR 84 | Temp 97.1°F

## 2023-04-21 DIAGNOSIS — Z515 Encounter for palliative care: Secondary | ICD-10-CM

## 2023-04-21 NOTE — Progress Notes (Signed)
PATIENT NAME: [name] DOB: June 28, 1925 MRN: [id number]  PRIMARY CARE PROVIDER: Jarrett Soho, PA-C  RESPONSIBLE PARTY:  Acct ID - Guarantor Home Phone Work Phone Relationship Acct Type  [id number] [name]* [phone]  Self P/F     [address], [place], [place] [address]   Palliative Care Follow Up Encounter Note    Completed home visit. [name] (niece) [name] Niece and [place] (daughters) also present.     Today's Visit: [name] reports pt has had a major decline in the past 14 days   Respiratory: still no issues   Cardiac: CHF; no edema noted to extremeties   Cognitive: Dementia unable to make needs known    Appetite: drinks less than 4oz daily and eats bites of food randomly   GI/GU: has BM every other day; wears an adult brief for occasional accidents   Mobility: unable to walk anymore; pt has not moved from her recliner within the past week   ADLs: totally dependent   Sleeping Pattern: changes in her sleep patterns, sometimes she sleeps all day and sleeps at night; will not sleep at night 2-3 days a week   Pain: denies pain this time   Wt: unable to stand anymore to get her weight     Goals of Care: To stay in the home with her dtrs for as long as possible    CODE STATUS: Full Code ADVANCED DIRECTIVES: N MOST FORM: N PPS: 40%    PHYSICAL EXAM:   VITALS: Today's Vitals   04/21/23 1206  BP: 120/70  Pulse: 84  Temp: (!) 97.1 F (36.2 C)  TempSrc: Temporal  SpO2: 98%  PainSc: 0-No pain    LUNGS: clear to auscultation  CARDIAC: Cor RRR EXTREMITIES: BLE stiff; BUE weakness SKIN: cool, dry, intact NEURO: positive for gait problems, memory problems, tremors, and weakness and a change in speech pattern   Next scheduled appt: will call to schedule before April 26, 2023    [name] Clementeen Graham, LPN

## 2023-04-26 DIAGNOSIS — I739 Peripheral vascular disease, unspecified: Secondary | ICD-10-CM | POA: Diagnosis not present

## 2023-04-26 DIAGNOSIS — N1832 Chronic kidney disease, stage 3b: Secondary | ICD-10-CM | POA: Diagnosis not present

## 2023-04-26 DIAGNOSIS — I5032 Chronic diastolic (congestive) heart failure: Secondary | ICD-10-CM | POA: Diagnosis not present

## 2023-04-26 DIAGNOSIS — F0390 Unspecified dementia without behavioral disturbance: Secondary | ICD-10-CM | POA: Diagnosis not present

## 2023-05-02 ENCOUNTER — Telehealth: Payer: Self-pay

## 2023-05-23 NOTE — Telephone Encounter (Signed)
We received a call from the patient's daughter. The patient passed away this morning. Future appointment has been cancelled.

## 2023-05-23 DEATH — deceased

## 2023-06-08 ENCOUNTER — Ambulatory Visit: Payer: Medicare PPO | Admitting: Podiatry

## 2023-06-10 ENCOUNTER — Ambulatory Visit: Payer: Medicare PPO | Admitting: Cardiology

## 2023-07-18 ENCOUNTER — Ambulatory Visit: Payer: Medicare PPO | Admitting: Family Medicine
